# Patient Record
Sex: Female | Born: 1950
Health system: Southern US, Community
[De-identification: ages and names within clinical notes are randomized; demographics above are authoritative.]

## PROBLEM LIST (undated history)

## (undated) DIAGNOSIS — Z860100 Personal history of colon polyps, unspecified: Secondary | ICD-10-CM

## (undated) DIAGNOSIS — K7689 Other specified diseases of liver: Secondary | ICD-10-CM

## (undated) DIAGNOSIS — R079 Chest pain, unspecified: Secondary | ICD-10-CM

## (undated) DIAGNOSIS — D734 Cyst of spleen: Secondary | ICD-10-CM

## (undated) DIAGNOSIS — E042 Nontoxic multinodular goiter: Secondary | ICD-10-CM

## (undated) DIAGNOSIS — E0590 Thyrotoxicosis, unspecified without thyrotoxic crisis or storm: Secondary | ICD-10-CM

## (undated) DIAGNOSIS — R918 Other nonspecific abnormal finding of lung field: Secondary | ICD-10-CM

## (undated) DIAGNOSIS — Z801 Family history of malignant neoplasm of trachea, bronchus and lung: Secondary | ICD-10-CM

## (undated) DIAGNOSIS — I10 Essential (primary) hypertension: Secondary | ICD-10-CM

## (undated) DIAGNOSIS — G8929 Other chronic pain: Secondary | ICD-10-CM

## (undated) HISTORY — DX: Personal history of colon polyps, unspecified: Z86.0100

## (undated) HISTORY — DX: Family history of malignant neoplasm of trachea, bronchus and lung: Z80.1

## (undated) HISTORY — DX: Personal history of colonic polyps: Z86.010

## (undated) HISTORY — PX: TONSILLECTOMY: SUR1361

---

## 1998-04-14 ENCOUNTER — Emergency Department (HOSPITAL_COMMUNITY): Admission: EM | Admit: 1998-04-14 | Discharge: 1998-04-14 | Payer: Self-pay | Admitting: Emergency Medicine

## 1998-04-16 ENCOUNTER — Emergency Department (HOSPITAL_COMMUNITY): Admission: EM | Admit: 1998-04-16 | Discharge: 1998-04-16 | Payer: Self-pay | Admitting: Emergency Medicine

## 1998-09-15 ENCOUNTER — Encounter: Payer: Self-pay | Admitting: Emergency Medicine

## 1998-09-15 ENCOUNTER — Emergency Department (HOSPITAL_COMMUNITY): Admission: EM | Admit: 1998-09-15 | Discharge: 1998-09-15 | Payer: Self-pay | Admitting: Emergency Medicine

## 1999-08-20 ENCOUNTER — Ambulatory Visit (HOSPITAL_COMMUNITY): Admission: RE | Admit: 1999-08-20 | Discharge: 1999-08-20 | Payer: Self-pay | Admitting: Internal Medicine

## 1999-08-20 ENCOUNTER — Encounter: Payer: Self-pay | Admitting: Internal Medicine

## 2000-02-09 ENCOUNTER — Emergency Department (HOSPITAL_COMMUNITY): Admission: EM | Admit: 2000-02-09 | Discharge: 2000-02-09 | Payer: Self-pay | Admitting: Emergency Medicine

## 2000-02-09 ENCOUNTER — Encounter: Payer: Self-pay | Admitting: Emergency Medicine

## 2000-08-24 ENCOUNTER — Emergency Department (HOSPITAL_COMMUNITY): Admission: EM | Admit: 2000-08-24 | Discharge: 2000-08-25 | Payer: Self-pay | Admitting: *Deleted

## 2002-09-09 ENCOUNTER — Emergency Department (HOSPITAL_COMMUNITY): Admission: EM | Admit: 2002-09-09 | Discharge: 2002-09-10 | Payer: Self-pay | Admitting: Emergency Medicine

## 2002-09-10 ENCOUNTER — Encounter: Payer: Self-pay | Admitting: Emergency Medicine

## 2002-09-27 ENCOUNTER — Encounter: Payer: Self-pay | Admitting: Family Medicine

## 2002-09-27 ENCOUNTER — Encounter: Admission: RE | Admit: 2002-09-27 | Discharge: 2002-09-27 | Payer: Self-pay | Admitting: Family Medicine

## 2003-08-18 ENCOUNTER — Emergency Department (HOSPITAL_COMMUNITY): Admission: EM | Admit: 2003-08-18 | Discharge: 2003-08-19 | Payer: Self-pay | Admitting: Emergency Medicine

## 2006-03-23 ENCOUNTER — Emergency Department (HOSPITAL_COMMUNITY): Admission: EM | Admit: 2006-03-23 | Discharge: 2006-03-23 | Payer: Self-pay | Admitting: Emergency Medicine

## 2006-08-03 ENCOUNTER — Emergency Department (HOSPITAL_COMMUNITY): Admission: EM | Admit: 2006-08-03 | Discharge: 2006-08-03 | Payer: Self-pay | Admitting: Family Medicine

## 2008-09-28 ENCOUNTER — Observation Stay (HOSPITAL_COMMUNITY): Admission: EM | Admit: 2008-09-28 | Discharge: 2008-09-29 | Payer: Self-pay | Admitting: Emergency Medicine

## 2008-09-28 ENCOUNTER — Ambulatory Visit: Payer: Self-pay | Admitting: Cardiovascular Disease

## 2008-09-29 ENCOUNTER — Encounter (INDEPENDENT_AMBULATORY_CARE_PROVIDER_SITE_OTHER): Payer: Self-pay | Admitting: Internal Medicine

## 2009-08-28 ENCOUNTER — Ambulatory Visit (HOSPITAL_COMMUNITY): Admission: RE | Admit: 2009-08-28 | Discharge: 2009-08-28 | Payer: Self-pay | Admitting: Psychiatry

## 2010-02-08 ENCOUNTER — Emergency Department (HOSPITAL_COMMUNITY): Admission: EM | Admit: 2010-02-08 | Discharge: 2010-02-09 | Payer: Self-pay | Admitting: Emergency Medicine

## 2010-11-10 ENCOUNTER — Encounter: Payer: Self-pay | Admitting: Internal Medicine

## 2010-11-10 ENCOUNTER — Encounter: Payer: Self-pay | Admitting: Family Medicine

## 2011-01-07 LAB — URINALYSIS, ROUTINE W REFLEX MICROSCOPIC
Glucose, UA: NEGATIVE mg/dL
Hgb urine dipstick: NEGATIVE
Specific Gravity, Urine: 1.033 — ABNORMAL HIGH (ref 1.005–1.030)
pH: 5.5 (ref 5.0–8.0)

## 2011-01-07 LAB — POCT I-STAT, CHEM 8
BUN: 17 mg/dL (ref 6–23)
Chloride: 110 mEq/L (ref 96–112)
Creatinine, Ser: 0.6 mg/dL (ref 0.4–1.2)
Potassium: 3.7 mEq/L (ref 3.5–5.1)
Sodium: 143 mEq/L (ref 135–145)

## 2011-01-07 LAB — CBC
HCT: 43.3 % (ref 36.0–46.0)
Hemoglobin: 14.4 g/dL (ref 12.0–15.0)
MCHC: 33.2 g/dL (ref 30.0–36.0)
MCV: 104.4 fL — ABNORMAL HIGH (ref 78.0–100.0)
Platelets: 268 10*3/uL (ref 150–400)
RBC: 4.15 MIL/uL (ref 3.87–5.11)

## 2011-01-07 LAB — URINE MICROSCOPIC-ADD ON

## 2011-01-07 LAB — DIFFERENTIAL
Lymphocytes Relative: 8 % — ABNORMAL LOW (ref 12–46)
Lymphs Abs: 1.5 10*3/uL (ref 0.7–4.0)
Monocytes Absolute: 0.1 10*3/uL (ref 0.1–1.0)
Neutrophils Relative %: 91 % — ABNORMAL HIGH (ref 43–77)

## 2011-03-04 NOTE — Discharge Summary (Signed)
Darlene Griffith, Darlene Griffith               ACCOUNT NO.:  [id number]   MEDICAL RECORD NO.:  [id number]          PATIENT TYPE:  OBV   LOCATION:  4710                         FACILITY:  [facility]   PHYSICIAN:  Elliot Cousin, M.D.    DATE OF BIRTH:  Feb 02, 1951   DATE OF ADMISSION:  09/28/2008  DATE OF DISCHARGE:  09/29/2008                               DISCHARGE SUMMARY   DISCHARGE DIAGNOSES:  1. Noncardiac chest pain.  2. Increased stressors at work.  3. Newly diagnosed goiter (TSH slightly low, free T4 pending).  4. Tobacco abuse.  5. Macrocytosis.  6. Hypokalemia.   DISCHARGE MEDICATIONS:  1. Protonix 40 mg daily.  2. Xanax 0.25 mg 1-2 tablets every 8 hours as needed for increased      stress and anxiety.  3. Multivitamin once daily.  4. Nicotine patch, use as directed on the label.  5. Tylenol Extra Strength, take as directed and as needed.  6. Discontinue Goody's powder.   DISCHARGE DISPOSITION:  The patient is currently in improved and stable  condition.  She will be discharged today.  She has no primary care  physician, however, she was given the number for Dr. Della Goo  and advised to follow up with her in 1-2 weeks.   CONSULTATIONS:  None.   PROCEDURES PERFORMED:  1. CT scan of the neck with contrast on September 29, 2008.  The      results revealed a 42 x 42-mm goiter on the left that extends into      the superior mediastinum and deviates the trachea to the right.  No      mass lesion is present and there is no significant cervical      adenopathy.  2. A 2-D echocardiogram on September 29, 2008.  The results revealed an      ejection fraction estimated to be 60%.  No left ventricular      regional wall motion abnormalities.  Findings consistent with very      mild aortic valve stenosis.  Left atrium was mildly dilated.   HISTORY OF PRESENT ILLNESS:  The patient is a 60 year old woman with no  significant past medical history, who presented to the emergency  department  on September 28, 2008 with a chief complaint of central chest  pain.  When she was evaluated in the emergency department, she was  hemodynamically stable.  Her lab data were virtually unremarkable.  Her  EKG revealed normal sinus rhythm without any ST or T-wave abnormalities.  Her chest x-ray revealed no acute cardiopulmonary process, although it  did reveal tracheal deviation to the right.  The patient was therefore  admitted for further evaluation and management.   For additional details, please see the dictated history and physical.   HOSPITAL COURSE:  1. CHEST PAIN.  The patient was started on nitroglycerin paste in the      emergency department.  Subsequently, the nitroglycerin paste was      discontinued and she was therefore started on as-needed sublingual      nitroglycerin.  Morphine was added at 2-4 mg IV every  4 hours      p.r.n. chest pain.  A baby aspirin was started as well as      prophylactic Protonix and Lovenox.  For further evaluation, D-      dimer, cardiac enzymes as well as a 2-D echocardiogram were      ordered.  The D-dimer was within normal limits at 0.30.  Her      cardiac enzymes were completely within normal limits.  Her 2-D      echocardiogram revealed no left ventricular regional wall motion      abnormalities.  Her ejection fraction was estimated to be 60%.  Her      fasting lipid profile revealed a total cholesterol of 184,      triglycerides of 116, HDL cholesterol of 39, and LDL cholesterol of      122.  Her TSH was slightly low at 0.311.  Because of her history of      smoking, the patient was counseled on tobacco cessation.  She was      also advised to stop smoking by the dictating physician.  With      further questioning, the patient acknowledged increased stress and      anxiety at work.  She also acknowledged taking Goody's powders on a      regular basis for stress-related headaches.  Her chest pain did      resolve during the hospitalization.   It was felt to be noncardiac      in origin.  Her chest pain may have been the consequence of      increased anxiety and/or gastroesophageal reflux disease versus      esophagitis from NSAID use.  The patient was advised to discontinue      Goody's powders and to try as-needed Tylenol.  Upon discharge,      Protonix was prescribed for further treatment.  Also, the patient      was prescribed as-needed Xanax for short-term treatment of anxiety      and stress.  2. GOITER AND SLIGHTLY LOW TSH.  For further evaluation of the right      tracheal deviation on the chest x-ray, a CT scan of the neck with      contrast was ordered.  The results are above; in essence, the CT      revealed a left-sided goiter.  There was no evidence of a      suspicious mass or airway compromise.  Her TSH was slightly low at      0.311.  A free T4 has been added and the result is currently      pending.  The patient was informed that the dictating physician      will call her next week with the results.  The patient was also      advised to call the dictating physician if she does not hear from      her in 3 days.  The patient voiced understanding.  3. MACROCYTOSIS.  The patient's hemoglobin was 12.1.  However, her MCV      was elevated at 104.1.  The patient denied alcohol use.  Vitamin      B12 and folate levels were ordered and the results are currently      pending.  The patient was advised to take a multivitamin once      daily.  4. HYPOKALEMIA.  At the time of the initial hospital assessment, the  patient's serum potassium was 3.3.  She was given 1 dose of oral      potassium chloride and her serum potassium improved to 3.6.  Blood      magnesium was ordered and was well within normal limits at 2.2.      Elliot Cousin, M.D.  Electronically Signed     DF/MEDQ  D:  09/29/2008  T:  09/30/2008  Job:  [id number]   cc:   Della Goo, M.D.

## 2011-03-04 NOTE — H&P (Signed)
Darlene Griffith, Darlene Griffith               ACCOUNT NO.:  [id number]   MEDICAL RECORD NO.:  [id number]          PATIENT TYPE:  EMS   LOCATION:  MAJO                         FACILITY:  [facility]   PHYSICIAN:  Ladell Pier, M.D.   DATE OF BIRTH:  08-Apr-1951   DATE OF ADMISSION:  09/28/2008  DATE OF DISCHARGE:                              HISTORY & PHYSICAL   CHIEF COMPLAINT:  Chest pain.   HISTORY OF PRESENT ILLNESS:  The patient is a 60 year old African  American female without any significant past medical history.  The  patient stated that last night she started having chest pain on and off  all night.  She stated that it was in the center of the chest.  It felt  more grabbing, and sometimes it felt as if it was shooting to her chest.  She stated that it happened most of the night, so she was scared to go  to sleep, so she kind of tried to stay up and watch TV.  In the morning,  the pain was still there.  She did not go into work until 12:30, because  she felt like going into work with the stress would make it even worse.  When she got into work at 12:30, actually about 20 minutes after sitting  down, she felt a grabbing chest pain in the center of her chest.  She  ended up having to go to the nurse where they sent her to the emergency  room.  She stated that was the second chest pain.  She did get some  shortness of breath.  She felt as if she could not breath.  She states  that she has been under a lot of stress lately with her mortgage, her  job, her children moving out, and now she is alone for the first time.  She is not used to being alone.  She was complaining of some soreness on  the left side of her chest for which she did see a doctor in the past,  but she does not have a lump or anything there.  She has not been to the  doctor in a long time since.   PAST MEDICAL HISTORY:  None.   PAST SURGICAL HISTORY:  C-section x1.   FAMILY HISTORY:  Mother is 56 years old.  She has thyroid  problems.  She  has history of heart disease and history of blood clots.  Father has  history unknown.  She had a grandmother that had a heart attack at 71.   SOCIAL HISTORY:  She smokes about half pack per day.  No alcohol use.  She is single.  She has four children.  She works in [employer].   MEDICATIONS:  None.   ALLERGIES:  None.   REVIEW OF SYSTEMS:  Negative, otherwise, stated in HPI.   PHYSICAL EXAMINATION:  VITAL SIGNS:  Temperature 98, blood pressure  104/66, pulse 92, respirations 26, pulse oximetry 98% on room air.  GENERAL:  The patient is sitting on the stretcher.  Does not seem to be  in any acute distress.  HEENT:  Normocephalic, atraumatic.  Pupils equal, round and reactive to  light.  Throat without erythema.  CARDIOVASCULAR:  Regular rate and rhythm with a 1/6 systolic murmur.  LUNGS:  Clear bilaterally.  No wheezes, rales or rhonchi.  ABDOMEN:  Soft, nontender, nondistended.  Positive bowel sounds.  EXTREMITIES:  Without edema.   LABORATORY DATA:  PT 12.0, INR 0.9, PTT 27.  Sodium 142, potassium 3.3,  chloride 107,  BUN 8, creatinine 0.7, glucose 98, hemoglobin 13.3 and  hematocrit 39.  Chest x-ray showed no acute cardiopulmonary process.  There is a deviation of the trachea to the right.  This could reflect  displacement of the trachea by a mass, adenopathy or asymmetrical  thyroid goiter.  Recommend follow up with a CT.  EKG:  No acute ST  segment elevation or depression.   ASSESSMENT/PLAN:  1. Chest pain.  2. Hypokalemia.  3. Tobacco abuse.  4. Abnormal chest x-ray, question of neck mass versus thyroid.  5. Deviated in the trachea.   Would admit the patient to the hospital.  Rule out MI with serial  enzymes.  Check TSH, fasting lipid panel, 2D echo.  Replete potassium.  Follow up with neck CT.      Ladell Pier, M.D.  Electronically Signed     N[place]/MEDQ  D:  09/28/2008  T:  09/29/2008  Job:  [id number]

## 2011-07-25 LAB — DIFFERENTIAL
Basophils Absolute: 0.1 10*3/uL (ref 0.0–0.1)
Basophils Relative: 0 % (ref 0–1)
Eosinophils Absolute: 0.2 10*3/uL (ref 0.0–0.7)
Eosinophils Absolute: 0.2 10*3/uL (ref 0.0–0.7)
Lymphs Abs: 3.6 10*3/uL (ref 0.7–4.0)
Lymphs Abs: 4 10*3/uL (ref 0.7–4.0)
Monocytes Absolute: 0.5 10*3/uL (ref 0.1–1.0)
Monocytes Relative: 5 % (ref 3–12)
Monocytes Relative: 5 % (ref 3–12)
Neutro Abs: 5.2 10*3/uL (ref 1.7–7.7)
Neutro Abs: 5.2 10*3/uL (ref 1.7–7.7)

## 2011-07-25 LAB — CBC
HCT: 38.3 % (ref 36.0–46.0)
Hemoglobin: 12.1 g/dL (ref 12.0–15.0)
Hemoglobin: 12.7 g/dL (ref 12.0–15.0)
MCHC: 33.3 g/dL (ref 30.0–36.0)
MCV: 106.2 fL — ABNORMAL HIGH (ref 78.0–100.0)
Platelets: 279 10*3/uL (ref 150–400)
RDW: 12.5 % (ref 11.5–15.5)
RDW: 12.7 % (ref 11.5–15.5)
WBC: 10 10*3/uL (ref 4.0–10.5)

## 2011-07-25 LAB — COMPREHENSIVE METABOLIC PANEL
Alkaline Phosphatase: 87 U/L (ref 39–117)
BUN: 7 mg/dL (ref 6–23)
BUN: 8 mg/dL (ref 6–23)
CO2: 24 mEq/L (ref 19–32)
CO2: 26 mEq/L (ref 19–32)
Calcium: 8.7 mg/dL (ref 8.4–10.5)
Calcium: 8.8 mg/dL (ref 8.4–10.5)
Chloride: 112 mEq/L (ref 96–112)
Creatinine, Ser: 0.52 mg/dL (ref 0.4–1.2)
GFR calc Af Amer: >60 mL/min (ref >60)
Potassium: 3.6 mEq/L (ref 3.5–5.1)
Total Bilirubin: 0.6 mg/dL (ref 0.3–1.2)
Total Protein: 5.7 g/dL — ABNORMAL LOW (ref 6.0–8.3)

## 2011-07-25 LAB — LIPID PANEL
Cholesterol: 184 mg/dL (ref 0–200)
Cholesterol: 205 mg/dL — ABNORMAL HIGH (ref 0–200)
HDL: 39 mg/dL — ABNORMAL LOW (ref >39)
HDL: 44 mg/dL (ref >39)
LDL Cholesterol: 113 mg/dL — ABNORMAL HIGH (ref 0–99)
Total CHOL/HDL Ratio: 4.7 RATIO
Triglycerides: 116 mg/dL (ref <150)
Triglycerides: 242 mg/dL — ABNORMAL HIGH (ref <150)
VLDL: 48 mg/dL — ABNORMAL HIGH (ref 0–40)

## 2011-07-25 LAB — CARDIAC PANEL(CRET KIN+CKTOT+MB+TROPI)
CK, MB: 0.7 ng/mL (ref 0.3–4.0)
Relative Index: INVALID (ref 0.0–2.5)
Total CK: 55 U/L (ref 7–177)
Troponin I: <0.01 ng/mL (ref 0.00–0.06)

## 2011-07-25 LAB — POCT I-STAT, CHEM 8
BUN: 8 mg/dL (ref 6–23)
Calcium, Ion: 1.21 mmol/L (ref 1.12–1.32)
Creatinine, Ser: 0.7 mg/dL (ref 0.4–1.2)
Glucose, Bld: 98 mg/dL (ref 70–99)
HCT: 39 % (ref 36.0–46.0)
Hemoglobin: 13.3 g/dL (ref 12.0–15.0)
TCO2: 27 mmol/L (ref 0–100)

## 2011-07-25 LAB — D-DIMER, QUANTITATIVE: D-Dimer, Quant: 0.31 ug/mL-FEU (ref 0.00–0.48)

## 2011-07-25 LAB — APTT: aPTT: 27 seconds (ref 24–37)

## 2011-07-25 LAB — POCT CARDIAC MARKERS: CKMB, poc: <1 ng/mL — ABNORMAL LOW (ref 1.0–8.0)

## 2011-07-25 LAB — TSH: TSH: 0.266 u[IU]/mL — ABNORMAL LOW (ref 0.350–4.500)

## 2011-07-25 LAB — CK TOTAL AND CKMB (NOT AT ARMC): Relative Index: INVALID (ref 0.0–2.5)

## 2011-07-25 LAB — MAGNESIUM: Magnesium: 2.2 mg/dL (ref 1.5–2.5)

## 2015-02-16 ENCOUNTER — Emergency Department (HOSPITAL_COMMUNITY)
Admission: EM | Admit: 2015-02-16 | Discharge: 2015-02-16 | Disposition: A | Discharge status: Home / Self Care | Payer: Self-pay

## 2015-02-16 ENCOUNTER — Inpatient Hospital Stay (HOSPITAL_BASED_OUTPATIENT_CLINIC_OR_DEPARTMENT_OTHER)
Admission: EM | Admit: 2015-02-16 | Discharge: 2015-02-18 | DRG: 313 | Disposition: A | Discharge status: Home / Self Care | Payer: 59 | Attending: Cardiology | Admitting: Cardiology

## 2015-02-16 ENCOUNTER — Emergency Department (HOSPITAL_BASED_OUTPATIENT_CLINIC_OR_DEPARTMENT_OTHER): Payer: 59

## 2015-02-16 ENCOUNTER — Encounter (HOSPITAL_BASED_OUTPATIENT_CLINIC_OR_DEPARTMENT_OTHER): Payer: Self-pay

## 2015-02-16 DIAGNOSIS — R911 Solitary pulmonary nodule: Secondary | ICD-10-CM

## 2015-02-16 DIAGNOSIS — R9431 Abnormal electrocardiogram [ECG] [EKG]: Secondary | ICD-10-CM

## 2015-02-16 DIAGNOSIS — K7689 Other specified diseases of liver: Secondary | ICD-10-CM

## 2015-02-16 DIAGNOSIS — R079 Chest pain, unspecified: Secondary | ICD-10-CM

## 2015-02-16 DIAGNOSIS — R0789 Other chest pain: Principal | ICD-10-CM

## 2015-02-16 DIAGNOSIS — Z87891 Personal history of nicotine dependence: Secondary | ICD-10-CM

## 2015-02-16 DIAGNOSIS — E669 Obesity, unspecified: Secondary | ICD-10-CM | POA: Diagnosis present

## 2015-02-16 DIAGNOSIS — R05 Cough: Secondary | ICD-10-CM

## 2015-02-16 DIAGNOSIS — R059 Cough, unspecified: Secondary | ICD-10-CM

## 2015-02-16 DIAGNOSIS — D734 Cyst of spleen: Secondary | ICD-10-CM

## 2015-02-16 DIAGNOSIS — E785 Hyperlipidemia, unspecified: Secondary | ICD-10-CM

## 2015-02-16 DIAGNOSIS — I350 Nonrheumatic aortic (valve) stenosis: Secondary | ICD-10-CM | POA: Diagnosis present

## 2015-02-16 DIAGNOSIS — Z6829 Body mass index (BMI) 29.0-29.9, adult: Secondary | ICD-10-CM

## 2015-02-16 DIAGNOSIS — E042 Nontoxic multinodular goiter: Secondary | ICD-10-CM | POA: Diagnosis present

## 2015-02-16 DIAGNOSIS — R0602 Shortness of breath: Secondary | ICD-10-CM

## 2015-02-16 DIAGNOSIS — I10 Essential (primary) hypertension: Secondary | ICD-10-CM

## 2015-02-16 DIAGNOSIS — J40 Bronchitis, not specified as acute or chronic: Secondary | ICD-10-CM

## 2015-02-16 HISTORY — DX: Chest pain, unspecified: R07.9

## 2015-02-16 HISTORY — DX: Cyst of spleen: D73.4

## 2015-02-16 HISTORY — DX: Other nonspecific abnormal finding of lung field: R91.8

## 2015-02-16 HISTORY — DX: Nontoxic multinodular goiter: E04.2

## 2015-02-16 HISTORY — DX: Essential (primary) hypertension: I10

## 2015-02-16 HISTORY — DX: Other specified diseases of liver: K76.89

## 2015-02-16 LAB — URINE MICROSCOPIC-ADD ON

## 2015-02-16 LAB — CBC WITH DIFFERENTIAL/PLATELET
BASOS PCT: 0 % (ref 0–1)
Basophils Absolute: 0 10*3/uL (ref 0.0–0.1)
EOS ABS: 0.3 10*3/uL (ref 0.0–0.7)
EOS PCT: 3 % (ref 0–5)
HCT: 40.8 % (ref 36.0–46.0)
Hemoglobin: 13.6 g/dL (ref 12.0–15.0)
LYMPHS ABS: 2.9 10*3/uL (ref 0.7–4.0)
Lymphocytes Relative: 27 % (ref 12–46)
MCH: 35 pg — ABNORMAL HIGH (ref 26.0–34.0)
MCHC: 33.3 g/dL (ref 30.0–36.0)
MCV: 104.9 fL — AB (ref 78.0–100.0)
MONO ABS: 0.8 10*3/uL (ref 0.1–1.0)
MONOS PCT: 8 % (ref 3–12)
NEUTROS ABS: 6.8 10*3/uL (ref 1.7–7.7)
Neutrophils Relative %: 62 % (ref 43–77)
PLATELETS: 280 10*3/uL (ref 150–400)
RBC: 3.89 MIL/uL (ref 3.87–5.11)
RDW: 11.4 % — AB (ref 11.5–15.5)
WBC: 10.9 10*3/uL — AB (ref 4.0–10.5)

## 2015-02-16 LAB — URINALYSIS, ROUTINE W REFLEX MICROSCOPIC
Bilirubin Urine: NEGATIVE
Glucose, UA: NEGATIVE mg/dL
KETONES UR: NEGATIVE mg/dL
Leukocytes, UA: NEGATIVE
Nitrite: NEGATIVE
PH: 6 (ref 5.0–8.0)
PROTEIN: NEGATIVE mg/dL
Specific Gravity, Urine: 1.024 (ref 1.005–1.030)
UROBILINOGEN UA: 1 mg/dL (ref 0.0–1.0)

## 2015-02-16 LAB — COMPREHENSIVE METABOLIC PANEL
ALT: 13 U/L (ref 0–35)
ANION GAP: 8 (ref 5–15)
AST: 15 U/L (ref 0–37)
Albumin: 4 g/dL (ref 3.5–5.2)
Alkaline Phosphatase: 92 U/L (ref 39–117)
BILIRUBIN TOTAL: 0.5 mg/dL (ref 0.3–1.2)
BUN: 8 mg/dL (ref 6–23)
CALCIUM: 8.5 mg/dL (ref 8.4–10.5)
CHLORIDE: 110 mmol/L (ref 96–112)
CO2: 23 mmol/L (ref 19–32)
Creatinine, Ser: 0.47 mg/dL — ABNORMAL LOW (ref 0.50–1.10)
Glucose, Bld: 106 mg/dL — ABNORMAL HIGH (ref 70–99)
Potassium: 3.2 mmol/L — ABNORMAL LOW (ref 3.5–5.1)
SODIUM: 141 mmol/L (ref 135–145)
Total Protein: 7.2 g/dL (ref 6.0–8.3)

## 2015-02-16 LAB — TROPONIN I
Troponin I: <0.03 ng/mL (ref <0.031)
Troponin I: <0.03 ng/mL (ref <0.031)

## 2015-02-16 LAB — BRAIN NATRIURETIC PEPTIDE: B Natriuretic Peptide: 29.2 pg/mL (ref 0.0–100.0)

## 2015-02-16 MED ORDER — ATORVASTATIN CALCIUM 40 MG PO TABS
40.0000 mg | ORAL_TABLET | Freq: Every day | ORAL | Status: DC
  Administered 2015-02-17: 40 mg via ORAL
  Filled 2015-02-16 (×2): qty 1

## 2015-02-16 MED ORDER — IPRATROPIUM-ALBUTEROL 0.5-2.5 (3) MG/3ML IN SOLN
3.0000 mL | RESPIRATORY_TRACT | Status: DC
  Administered 2015-02-16 (×2): 3 mL via RESPIRATORY_TRACT
  Filled 2015-02-16 (×2): qty 3

## 2015-02-16 MED ORDER — NITROGLYCERIN 0.4 MG SL SUBL: 0.4000 mg | SUBLINGUAL_TABLET | SUBLINGUAL | Status: DC | PRN

## 2015-02-16 MED ORDER — METOPROLOL TARTRATE 25 MG PO TABS
25.0000 mg | ORAL_TABLET | Freq: Two times a day (BID) | ORAL | Status: DC
  Filled 2015-02-16: qty 1

## 2015-02-16 MED ORDER — NITROGLYCERIN 2 % TD OINT
1.0000 [in_us] | TOPICAL_OINTMENT | Freq: Once | TRANSDERMAL | Status: AC
  Administered 2015-02-16: 1 [in_us] via TOPICAL
  Filled 2015-02-16: qty 1

## 2015-02-16 MED ORDER — ASPIRIN 300 MG RE SUPP
300.0000 mg | RECTAL | Status: DC
  Filled 2015-02-16: qty 1

## 2015-02-16 MED ORDER — ONDANSETRON HCL 4 MG/2ML IJ SOLN: 4.0000 mg | Freq: Four times a day (QID) | INTRAMUSCULAR | Status: DC | PRN

## 2015-02-16 MED ORDER — ASPIRIN 81 MG PO CHEW
324.0000 mg | CHEWABLE_TABLET | Freq: Once | ORAL | Status: AC
  Administered 2015-02-16: 324 mg via ORAL
  Filled 2015-02-16: qty 4

## 2015-02-16 MED ORDER — ASPIRIN 81 MG PO CHEW: 324.0000 mg | CHEWABLE_TABLET | ORAL | Status: DC

## 2015-02-16 MED ORDER — ACETAMINOPHEN 325 MG PO TABS
650.0000 mg | ORAL_TABLET | Freq: Once | ORAL | Status: AC
  Administered 2015-02-16: 650 mg via ORAL
  Filled 2015-02-16: qty 2

## 2015-02-16 MED ORDER — ENOXAPARIN SODIUM 40 MG/0.4ML ~~LOC~~ SOLN
40.0000 mg | Freq: Every day | SUBCUTANEOUS | Status: DC
  Administered 2015-02-17 – 2015-02-18 (×2): 40 mg via SUBCUTANEOUS
  Filled 2015-02-16 (×2): qty 0.4

## 2015-02-16 MED ORDER — ASPIRIN EC 81 MG PO TBEC
81.0000 mg | DELAYED_RELEASE_TABLET | Freq: Every day | ORAL | Status: DC
  Administered 2015-02-17 – 2015-02-18 (×2): 81 mg via ORAL
  Filled 2015-02-16 (×2): qty 1

## 2015-02-16 MED ORDER — ACETAMINOPHEN 325 MG PO TABS
650.0000 mg | ORAL_TABLET | ORAL | Status: DC | PRN
  Administered 2015-02-17 – 2015-02-18 (×3): 650 mg via ORAL
  Filled 2015-02-16 (×3): qty 2

## 2015-02-16 NOTE — ED Provider Notes (Signed)
CSN: [id number]     Arrival date & time 02/16/15  7672 History  This chart was scribed for Charlesetta Shanks, MD by [name] Holstein, ED Scribe. This patient was seen in room MH02/MH02 and the patient's care was started at 7:42 PM.     Chief Complaint  Patient presents with  . Generalized Body Aches      The history is provided by the patient. No language interpreter was used.   HPI Comments: Darlene Griffith is a 64 y.o. female with PMHx of HTN who presents to the Emergency Department complaining of generalized body aches with onset 5 days ago. Pt states she was dx with the flu 6 weeks ago. She states she was not hospitalized at that time. She notes the associated cough remained, worsening 5 days ago. Pt reports associated intermittent chest pain with onset yesterday, congestion, intermittent subjective fever and chills for 3 days, diaphoresis, generalized weakness, recurrent headache and intermittent vomiting with onset yesterday. Pt has tried [place] powder, Robitussin, VapoRub, and Biofreeze cream for relief. Pt states she accidentally took 3 Tramadol 3 days ago before symptoms worsened and onset of vomiting. She states she was able to tolerate chicken noodle soup today. Pt is not a smoker. Pt denies fever currently, abdominal pain, diarrhea, sinus pain, and rash.  Past Medical History  Diagnosis Date  . Essential hypertension   . Pulmonary nodules     a. 01/2015 CT Chest: RLL ~ 61mm subpleural nodule - rec f/u in 6-12 mos.  . Multinodular goiter     a. 01/2015 CT chest: multinodular goidter w/ substernal extension of the left lobe of the thyroid assoc w/ rightward deviation of tracheal air column.  . Chest pain     a. 01/2015 Echo: Nl LV fxn, Gr 1 DD, triv AI, mild MR.  . Hepatic cyst     a. noted on CT 01/2015.  [name] Splenic cyst     a. noted on CT 01/2015.   Past Surgical History  Procedure Laterality Date  . Cesarean section     No family history on file. History  Substance Use Topics  .  Smoking status: Never Smoker   . Smokeless tobacco: Not on file  . Alcohol Use: No   OB History    No data available     Review of Systems 10 Systems reviewed and all are negative for acute change except as noted in the HPI.     Allergies  Review of patient's allergies indicates no known allergies.  Home Medications   Prior to Admission medications   Medication Sig Start Date End Date Taking? Authorizing Provider  aspirin EC 81 MG EC tablet Take 1 tablet (81 mg total) by mouth daily. 02/18/15   Rogelia Mire, NP  atorvastatin (LIPITOR) 40 MG tablet Take 1 tablet (40 mg total) by mouth daily at 6 PM. 02/18/15   Rogelia Mire, NP  azelastine (ASTELIN) 0.1 % nasal spray Place 2 sprays into both nostrils 2 (two) times daily. Use in each nostril as directed 02/18/15   Rogelia Mire, NP  lisinopril (PRINIVIL,ZESTRIL) 20 MG tablet Take 1 tablet (20 mg total) by mouth daily. 02/18/15   Rogelia Mire, NP  nitroGLYCERIN (NITROSTAT) 0.4 MG SL tablet Place 1 tablet (0.4 mg total) under the tongue every 5 (five) minutes x 3 doses as needed for chest pain. 02/18/15   Rogelia Mire, NP   BP 137/84 mmHg  Pulse 82  Temp(Src) 98.4 F (36.9 C) (  Oral)  Resp 18  Ht 5\' 1"  (1.549 m)  Wt 156 lb 15.5 oz (71.2 kg)  BMI 29.67 kg/m2  SpO2 100% Physical Exam  Constitutional: She is oriented to person, place, and time. She appears well-developed and well-nourished.  HENT:  Head: Normocephalic and atraumatic.  Eyes: EOM are normal. Pupils are equal, round, and reactive to light.  Neck: Neck supple.  Cardiovascular: Normal rate, regular rhythm, normal heart sounds and intact distal pulses.   Pulmonary/Chest: Effort normal and breath sounds normal.  Abdominal: Soft. Bowel sounds are normal. She exhibits no distension. There is no tenderness.  Musculoskeletal: Normal range of motion. She exhibits no edema.  Neurological: She is alert and oriented to person, place, and time. She has  normal strength. Coordination normal. GCS eye subscore is 4. GCS verbal subscore is 5. GCS motor subscore is 6.  Skin: Skin is warm, dry and intact.  Psychiatric: She has a normal mood and affect.    ED Course  Procedures (including critical care time) DIAGNOSTIC STUDIES: Oxygen Saturation is 99% on room air, normal by my interpretation.    COORDINATION OF CARE: 7:50 PM Discussed treatment plan with patient at beside, the patient agrees with the plan and has no further questions at this time.   Labs Review Labs Reviewed  CBC WITH DIFFERENTIAL/PLATELET - Abnormal; Notable for the following:    WBC 10.9 (*)    MCV 104.9 (*)    MCH 35.0 (*)    RDW 11.4 (*)    All other components within normal limits  COMPREHENSIVE METABOLIC PANEL - Abnormal; Notable for the following:    Potassium 3.2 (*)    Glucose, Bld 106 (*)    Creatinine, Ser 0.47 (*)    All other components within normal limits  URINALYSIS, ROUTINE W REFLEX MICROSCOPIC - Abnormal; Notable for the following:    Hgb urine dipstick MODERATE (*)    All other components within normal limits  CBC - Abnormal; Notable for the following:    RBC 3.67 (*)    MCV 103.8 (*)    MCH 34.1 (*)    All other components within normal limits  LIPID PANEL - Abnormal; Notable for the following:    Cholesterol 213 (*)    HDL 37 (*)    LDL Cholesterol 151 (*)    All other components within normal limits  BRAIN NATRIURETIC PEPTIDE  TROPONIN I  URINE MICROSCOPIC-ADD ON  TROPONIN I  CREATININE, SERUM  TROPONIN I  TROPONIN I  TROPONIN I  TSH  T3, FREE  T4, FREE  HEMOGLOBIN A1C  INFLUENZA PANEL BY PCR (TYPE A & B, H1N1)    Imaging Review No results found.   EKG Interpretation   Date/Time:  Friday February 16 2015 18:37:50 EDT Ventricular Rate:  93 PR Interval:  172 QRS Duration: 82 QT Interval:  346 QTC Calculation: 430 R Axis:   53 Text Interpretation:  Normal sinus rhythm Right atrial enlargement  Nonspecific ST and T wave  abnormality Abnormal ECG agree.  Confirmed by  Johnney Killian, [place], Jeannie Done [phone]) on 02/16/2015 9:17:45 PM      Results for orders placed or performed during the hospital encounter of 02/16/15  CBC with Differential  Result Value Ref Range   WBC 10.9 (H) 4.0 - 10.5 K/uL   RBC 3.89 3.87 - 5.11 MIL/uL   Hemoglobin 13.6 12.0 - 15.0 g/dL   HCT 40.8 36.0 - 46.0 %   MCV 104.9 (H) 78.0 - 100.0 fL  MCH 35.0 (H) 26.0 - 34.0 pg   MCHC 33.3 30.0 - 36.0 g/dL   RDW 11.4 (L) 11.5 - 15.5 %   Platelets 280 150 - 400 K/uL   Neutrophils Relative % 62 43 - 77 %   Neutro Abs 6.8 1.7 - 7.7 K/uL   Lymphocytes Relative 27 12 - 46 %   Lymphs Abs 2.9 0.7 - 4.0 K/uL   Monocytes Relative 8 3 - 12 %   Monocytes Absolute 0.8 0.1 - 1.0 K/uL   Eosinophils Relative 3 0 - 5 %   Eosinophils Absolute 0.3 0.0 - 0.7 K/uL   Basophils Relative 0 0 - 1 %   Basophils Absolute 0.0 0.0 - 0.1 K/uL  Comprehensive metabolic panel  Result Value Ref Range   Sodium 141 135 - 145 mmol/L   Potassium 3.2 (L) 3.5 - 5.1 mmol/L   Chloride 110 96 - 112 mmol/L   CO2 23 19 - 32 mmol/L   Glucose, Bld 106 (H) 70 - 99 mg/dL   BUN 8 6 - 23 mg/dL   Creatinine, Ser 0.47 (L) 0.50 - 1.10 mg/dL   Calcium 8.5 8.4 - 10.5 mg/dL   Total Protein 7.2 6.0 - 8.3 g/dL   Albumin 4.0 3.5 - 5.2 g/dL   AST 15 0 - 37 U/L   ALT 13 0 - 35 U/L   Alkaline Phosphatase 92 39 - 117 U/L   Total Bilirubin 0.5 0.3 - 1.2 mg/dL   GFR calc non Af Amer >90 >90 mL/min   GFR calc Af Amer >90 >90 mL/min   Anion gap 8 5 - 15  Urinalysis, Routine w reflex microscopic  Result Value Ref Range   Color, Urine YELLOW YELLOW   APPearance CLEAR CLEAR   Specific Gravity, Urine 1.024 1.005 - 1.030   pH 6.0 5.0 - 8.0   Glucose, UA NEGATIVE NEGATIVE mg/dL   Hgb urine dipstick MODERATE (A) NEGATIVE   Bilirubin Urine NEGATIVE NEGATIVE   Ketones, ur NEGATIVE NEGATIVE mg/dL   Protein, ur NEGATIVE NEGATIVE mg/dL   Urobilinogen, UA 1.0 0.0 - 1.0 mg/dL   Nitrite NEGATIVE NEGATIVE    Leukocytes, UA NEGATIVE NEGATIVE  Brain natriuretic peptide  Result Value Ref Range   B Natriuretic Peptide 29.2 0.0 - 100.0 pg/mL  Troponin I  Result Value Ref Range   Troponin I <0.03 <0.031 ng/mL  Urine microscopic-add on  Result Value Ref Range   Squamous Epithelial / LPF RARE RARE   RBC / HPF 7-10 <3 RBC/hpf   Bacteria, UA RARE RARE  Troponin I  Result Value Ref Range   Troponin I <0.03 <0.031 ng/mL  CBC  Result Value Ref Range   WBC 9.8 4.0 - 10.5 K/uL   RBC 3.67 (L) 3.87 - 5.11 MIL/uL   Hemoglobin 12.5 12.0 - 15.0 g/dL   HCT 38.1 36.0 - 46.0 %   MCV 103.8 (H) 78.0 - 100.0 fL   MCH 34.1 (H) 26.0 - 34.0 pg   MCHC 32.8 30.0 - 36.0 g/dL   RDW 12.1 11.5 - 15.5 %   Platelets 290 150 - 400 K/uL  Creatinine, serum  Result Value Ref Range   Creatinine, Ser 0.62 0.50 - 1.10 mg/dL   GFR calc non Af Amer >90 >90 mL/min   GFR calc Af Amer >90 >90 mL/min  Troponin I-(serum)  Result Value Ref Range   Troponin I <0.03 <0.031 ng/mL  Troponin I-(serum)  Result Value Ref Range   Troponin I <0.03 <0.031  ng/mL  Troponin I-(serum)  Result Value Ref Range   Troponin I 0.03 <0.031 ng/mL  TSH  Result Value Ref Range   TSH 1.074 0.350 - 4.500 uIU/mL  T3, free  Result Value Ref Range   T3, Free 2.5 2.0 - 4.4 pg/mL  T4, free  Result Value Ref Range   Free T4 1.01 0.80 - 1.80 ng/dL  Hemoglobin A1c  Result Value Ref Range   Hgb A1c MFr Bld 5.5 4.8 - 5.6 %   Mean Plasma Glucose 111 mg/dL  Lipid panel  Result Value Ref Range   Cholesterol 213 (H) 0 - 200 mg/dL   Triglycerides 124 <150 mg/dL   HDL 37 (L) >39 mg/dL   Total CHOL/HDL Ratio 5.8 RATIO   VLDL 25 0 - 40 mg/dL   LDL Cholesterol 151 (H) 0 - 99 mg/dL  Influenza panel by PCR (type A & B, H1N1)  Result Value Ref Range   Influenza A By PCR NEGATIVE NEGATIVE   Influenza B By PCR NEGATIVE NEGATIVE   H1N1 flu by pcr NOT DETECTED NOT DETECTED   Dg Chest 2 View  02/16/2015   CLINICAL DATA:  Cough and fever.  One day history  of chest pain  EXAM: CHEST  2 VIEW  COMPARISON:  July 29, 2008  FINDINGS: There is no edema or consolidation. Heart size and pulmonary vascularity are normal. No adenopathy. There is rightward deviation of the thoracic trachea. No bone lesions.  IMPRESSION: Rightward deviation of the thoracic trachea, finding that was also present on prior study. The most likely etiology for this finding is thyroid enlargement. No edema or consolidation.   Electronically Signed   By: Lowella Grip III M.D.   On: 02/16/2015 20:14   Ct Chest Wo Contrast  02/17/2015   CLINICAL DATA:  Body aches, chills and cough.  EXAM: CT CHEST WITHOUT CONTRAST  TECHNIQUE: Multidetector CT imaging of the chest was performed following the standard protocol without IV contrast.  COMPARISON:  Chest radiograph- 02/16/2015; 09/28/2008  FINDINGS: There is minimal subsegmental atelectasis within the right costophrenic angle (image 38, series 3) as well as is the lingula (image 29, series 3). No discrete focal airspace opacities. No pleural effusion or pneumothorax.  There is rightward deviation of the trachea secondary to substernal extension of a heterogeneous and enlarged left lobe of the thyroid which contains innumerable ill-defined hypo attenuating nodules with dominant nodule within the caudal aspect of the left lobe of the thyroid measuring approximate 1.5 x 2.2 cm (image 15, series 2) and dominant ill-defined nodule/mass within the right lobe of the thyroid measuring approximately 2.4 x 2.7 cm (image 1, series 2). The left lobe of the thyroid extends to near the level of the carina. The central pulmonary airways remain patent.  Punctate (approximately 5 mm) subpleural nodule within the right lower lobe (image 30, series 3). Scattered shotty mediastinal lymph nodes individually not enlarged by size criteria. No mediastinal, hilar axillary lymphadenopathy on this noncontrast examination.  Normal heart size. No pericardial effusion. Normal  caliber of the thoracic aorta. No intramural hematoma.  Limited noncontrast evaluation of the upper abdomen demonstrates multiple ill-defined hypo attenuating hepatic lesions with dominant lesion within the posterior aspect of the lateral segment of the left lobe of the liver measuring approximately 1.6 cm in diameter (image 47, series 2) and while incompletely characterized on the present examination, in the absence of a known primary malignancy is favored are favored to represent hepatic cysts. Ill-defined punctate (approximately 6  mm) hypoattenuating splenic lesion, likely too small to adequately characterize of favored to represent a splenic cyst. No perisplenic stranding.  No acute or aggressive osseous abnormalities. Mild DDD within the mid aspect of the thoracic spine with mildly accentuated thoracic kyphosis.  Regional soft tissues appear normal.  IMPRESSION: 1. No acute cardiopulmonary disease. Specifically, no focal airspace opacities to suggest pneumonia. 2. Indeterminate punctate (approximately 5 mm) subpleural nodule within the right lower lobe. Comparison with prior outside examinations is recommended. If the patient is at high risk for bronchogenic carcinoma, follow-up chest CT at 6-12 months is recommended. If the patient is at low risk for bronchogenic carcinoma, follow-up chest CT at 12 months is recommended. This recommendation follows the consensus statement: Guidelines for Management of Small Pulmonary Nodules Detected on CT Scans: A Statement from the [place] as published in Radiology 2005;237:395-400. 3. Ill-defined hypoattenuating hepatic and splenic lesions, incompletely characterized on the present examination though favored to represent cysts. 4. Findings compatible with multi nodular goiter with substernal extension of the left lobe of the thyroid to near the level of the carina with associated rightward deviation of the tracheal air column as demonstrated on prior chest  radiographs.   Electronically Signed   By: Sandi Mariscal M.D.   On: 02/17/2015 07:10     Meds ordered this encounter  Medications  . DISCONTD: ipratropium-albuterol (DUONEB) 0.5-2.5 (3) MG/3ML nebulizer solution 3 mL    Sig:   . aspirin chewable tablet 324 mg    Sig:   . nitroGLYCERIN (NITROGLYN) 2 % ointment 1 inch    Sig:   . acetaminophen (TYLENOL) tablet 650 mg    Sig:   . DISCONTD: aspirin chewable tablet 324 mg    Sig:   . DISCONTD: aspirin suppository 300 mg    Sig:   . DISCONTD: aspirin EC tablet 81 mg    Sig:   . DISCONTD: nitroGLYCERIN (NITROSTAT) SL tablet 0.4 mg    Sig:   . DISCONTD: acetaminophen (TYLENOL) tablet 650 mg    Sig:   . DISCONTD: ondansetron (ZOFRAN) injection 4 mg    Sig:   . DISCONTD: enoxaparin (LOVENOX) injection 40 mg    Sig:   . DISCONTD: metoprolol tartrate (LOPRESSOR) tablet 25 mg    Sig:   . DISCONTD: atorvastatin (LIPITOR) tablet 40 mg    Sig:   . DISCONTD: ipratropium-albuterol (DUONEB) 0.5-2.5 (3) MG/3ML nebulizer solution 3 mL    Sig:   . DISCONTD: potassium chloride SA (K-DUR,KLOR-CON) CR tablet 40 mEq    Sig:   . DISCONTD: lisinopril (PRINIVIL,ZESTRIL) tablet 20 mg    Sig:   . DISCONTD: azelastine (ASTELIN) 0.1 % nasal spray 2 spray    Sig:   . DISCONTD: HYDROcodone-homatropine (HYCODAN) 5-1.5 MG/5ML syrup 5 mL    Sig:   . aspirin EC 81 MG EC tablet    Sig: Take 1 tablet (81 mg total) by mouth daily.    Order Specific Question:  Supervising Provider    Answer:  COOPER, MICHAEL [[id number]]  . atorvastatin (LIPITOR) 40 MG tablet    Sig: Take 1 tablet (40 mg total) by mouth daily at 6 PM.    Dispense:  30 tablet    Refill:  6    Order Specific Question:  Supervising Provider    Answer:  COOPER, MICHAEL [[id number]]  . azelastine (ASTELIN) 0.1 % nasal spray    Sig: Place 2 sprays into both nostrils 2 (two) times daily. Use in each nostril as directed  Dispense:  30 mL    Refill:  6    Order Specific Question:  Supervising Provider     Answer:  COOPER, MICHAEL [[id number]]  . lisinopril (PRINIVIL,ZESTRIL) 20 MG tablet    Sig: Take 1 tablet (20 mg total) by mouth daily.    Dispense:  30 tablet    Refill:  6    Order Specific Question:  Supervising Provider    Answer:  COOPER, MICHAEL [[id number]]  . nitroGLYCERIN (NITROSTAT) 0.4 MG SL tablet    Sig: Place 1 tablet (0.4 mg total) under the tongue every 5 (five) minutes x 3 doses as needed for chest pain.    Dispense:  25 tablet    Refill:  3    Order Specific Question:  Supervising Provider    Answer:  Burt Knack, MICHAEL [[id number]]    MDM   Final diagnoses:  Chest pain, unspecified chest pain type  EKG abnormality  Bronchitis  Essential hypertension   The patient presents with multiple symptoms. She does however have chest pain with abnormal EKG. She has risk factors with hypertension. The patient will be admitted for further cardiac evaluation and observation.    Charlesetta Shanks, MD 02/20/15 [phone]

## 2015-02-16 NOTE — ED Notes (Signed)
Pt complains of bodyaches, chills, fever and cough.  Pt reports chest pain started yesterday.  Pt reports coughing up small amount of phlegm.

## 2015-02-17 ENCOUNTER — Encounter (HOSPITAL_COMMUNITY): Payer: Self-pay | Admitting: Radiology

## 2015-02-17 ENCOUNTER — Inpatient Hospital Stay (HOSPITAL_COMMUNITY): Payer: 59

## 2015-02-17 DIAGNOSIS — J40 Bronchitis, not specified as acute or chronic: Secondary | ICD-10-CM

## 2015-02-17 DIAGNOSIS — R079 Chest pain, unspecified: Secondary | ICD-10-CM

## 2015-02-17 DIAGNOSIS — R05 Cough: Secondary | ICD-10-CM

## 2015-02-17 DIAGNOSIS — D734 Cyst of spleen: Secondary | ICD-10-CM

## 2015-02-17 DIAGNOSIS — E785 Hyperlipidemia, unspecified: Secondary | ICD-10-CM

## 2015-02-17 DIAGNOSIS — R059 Cough, unspecified: Secondary | ICD-10-CM | POA: Insufficient documentation

## 2015-02-17 DIAGNOSIS — R9431 Abnormal electrocardiogram [ECG] [EKG]: Secondary | ICD-10-CM | POA: Insufficient documentation

## 2015-02-17 DIAGNOSIS — K7689 Other specified diseases of liver: Secondary | ICD-10-CM

## 2015-02-17 DIAGNOSIS — R5383 Other fatigue: Secondary | ICD-10-CM

## 2015-02-17 DIAGNOSIS — R911 Solitary pulmonary nodule: Secondary | ICD-10-CM

## 2015-02-17 DIAGNOSIS — I10 Essential (primary) hypertension: Secondary | ICD-10-CM

## 2015-02-17 DIAGNOSIS — R5381 Other malaise: Secondary | ICD-10-CM | POA: Insufficient documentation

## 2015-02-17 DIAGNOSIS — I350 Nonrheumatic aortic (valve) stenosis: Secondary | ICD-10-CM | POA: Insufficient documentation

## 2015-02-17 DIAGNOSIS — I351 Nonrheumatic aortic (valve) insufficiency: Secondary | ICD-10-CM | POA: Diagnosis not present

## 2015-02-17 DIAGNOSIS — R0602 Shortness of breath: Secondary | ICD-10-CM | POA: Insufficient documentation

## 2015-02-17 LAB — CREATININE, SERUM: CREATININE: 0.62 mg/dL (ref 0.50–1.10)

## 2015-02-17 LAB — INFLUENZA PANEL BY PCR (TYPE A & B)
H1N1 flu by pcr: NOT DETECTED
Influenza A By PCR: NEGATIVE
Influenza B By PCR: NEGATIVE

## 2015-02-17 LAB — CBC
HCT: 38.1 % (ref 36.0–46.0)
HEMOGLOBIN: 12.5 g/dL (ref 12.0–15.0)
MCH: 34.1 pg — AB (ref 26.0–34.0)
MCHC: 32.8 g/dL (ref 30.0–36.0)
MCV: 103.8 fL — AB (ref 78.0–100.0)
PLATELETS: 290 10*3/uL (ref 150–400)
RBC: 3.67 MIL/uL — ABNORMAL LOW (ref 3.87–5.11)
RDW: 12.1 % (ref 11.5–15.5)
WBC: 9.8 10*3/uL (ref 4.0–10.5)

## 2015-02-17 LAB — TROPONIN I
TROPONIN I: 0.03 ng/mL (ref <0.031)
Troponin I: <0.03 ng/mL (ref <0.031)

## 2015-02-17 LAB — LIPID PANEL
Cholesterol: 213 mg/dL — ABNORMAL HIGH (ref 0–200)
HDL: 37 mg/dL — ABNORMAL LOW (ref >39)
LDL Cholesterol: 151 mg/dL — ABNORMAL HIGH (ref 0–99)
Total CHOL/HDL Ratio: 5.8 RATIO
Triglycerides: 124 mg/dL (ref <150)
VLDL: 25 mg/dL (ref 0–40)

## 2015-02-17 LAB — T4, FREE: FREE T4: 1.01 ng/dL (ref 0.80–1.80)

## 2015-02-17 LAB — TSH: TSH: 1.074 u[IU]/mL (ref 0.350–4.500)

## 2015-02-17 MED ORDER — LISINOPRIL 20 MG PO TABS
20.0000 mg | ORAL_TABLET | Freq: Every day | ORAL | Status: DC
  Administered 2015-02-17 – 2015-02-18 (×2): 20 mg via ORAL
  Filled 2015-02-17 (×2): qty 1

## 2015-02-17 MED ORDER — AZELASTINE HCL 0.1 % NA SOLN
2.0000 | Freq: Two times a day (BID) | NASAL | Status: DC
  Administered 2015-02-17 – 2015-02-18 (×4): 2 via NASAL
  Filled 2015-02-17: qty 30

## 2015-02-17 MED ORDER — HYDROCODONE-HOMATROPINE 5-1.5 MG/5ML PO SYRP
5.0000 mL | ORAL_SOLUTION | ORAL | Status: DC | PRN
  Administered 2015-02-17 (×4): 5 mL via ORAL
  Filled 2015-02-17 (×4): qty 5

## 2015-02-17 MED ORDER — POTASSIUM CHLORIDE CRYS ER 20 MEQ PO TBCR
40.0000 meq | EXTENDED_RELEASE_TABLET | Freq: Two times a day (BID) | ORAL | Status: DC
  Administered 2015-02-17 – 2015-02-18 (×4): 40 meq via ORAL
  Filled 2015-02-17 (×5): qty 2

## 2015-02-17 MED ORDER — IPRATROPIUM-ALBUTEROL 0.5-2.5 (3) MG/3ML IN SOLN: 3.0000 mL | Freq: Four times a day (QID) | RESPIRATORY_TRACT | Status: DC | PRN

## 2015-02-17 NOTE — H&P (Signed)
Darlene Griffith is an 64 y.o. female.   Chief Complaint: Chest pain  HPI:  Darlene Griffith is a pleasant 64 year old female with history of goiter and hypertension. She is currently not being treated for hypertension. She generally is avoidant of physicians. She has been having cough and fatigue for several weeks. She started having sharp pain in the left breast 2 days ago. The pain lasts for maybe 1-2 minutes and generally is occuring at rest. It is not itself associated with shortness of breath, diaphoresis or syncope. She has been short of breath with cough for several weeks. This has been associated with fever and productive cough. She did have some brief improvement in the symptoms several weeks ago but then the symptoms worsened. She does have a history of goiter which she was lost to follow up for. She also has a history of hypertension and was prescribed lisinopril but she completed all the pills she had on hand and has not followed up for more refills. She has not noted any exertional chest pain and is currently chest pain free. She does have a history of smoking but has quit. She also has a burning pain in the left rib area that has been present for years.   Past Medical History  Diagnosis Date  . Hypertension     Past Surgical History  Procedure Laterality Date  . Cesarean section      No family history on file. Social History:  reports that she has never smoked. She does not have any smokeless tobacco history on file. She reports that she does not drink alcohol or use illicit drugs.  Allergies: No Known Allergies  No prescriptions prior to admission    Results for orders placed or performed during the hospital encounter of 02/16/15 (from the past 48 hour(s))  CBC with Differential     Status: Abnormal   Collection Time: 02/16/15  7:22 PM  Result Value Ref Range   WBC 10.9 (H) 4.0 - 10.5 K/uL   RBC 3.89 3.87 - 5.11 MIL/uL   Hemoglobin 13.6 12.0 - 15.0 g/dL   HCT 40.8 36.0 - 46.0 %    MCV 104.9 (H) 78.0 - 100.0 fL   MCH 35.0 (H) 26.0 - 34.0 pg   MCHC 33.3 30.0 - 36.0 g/dL   RDW 11.4 (L) 11.5 - 15.5 %   Platelets 280 150 - 400 K/uL   Neutrophils Relative % 62 43 - 77 %   Neutro Abs 6.8 1.7 - 7.7 K/uL   Lymphocytes Relative 27 12 - 46 %   Lymphs Abs 2.9 0.7 - 4.0 K/uL   Monocytes Relative 8 3 - 12 %   Monocytes Absolute 0.8 0.1 - 1.0 K/uL   Eosinophils Relative 3 0 - 5 %   Eosinophils Absolute 0.3 0.0 - 0.7 K/uL   Basophils Relative 0 0 - 1 %   Basophils Absolute 0.0 0.0 - 0.1 K/uL  Comprehensive metabolic panel     Status: Abnormal   Collection Time: 02/16/15  7:22 PM  Result Value Ref Range   Sodium 141 135 - 145 mmol/L   Potassium 3.2 (L) 3.5 - 5.1 mmol/L   Chloride 110 96 - 112 mmol/L   CO2 23 19 - 32 mmol/L   Glucose, Bld 106 (H) 70 - 99 mg/dL   BUN 8 6 - 23 mg/dL   Creatinine, Ser 0.47 (L) 0.50 - 1.10 mg/dL   Calcium 8.5 8.4 - 10.5 mg/dL   Total Protein  7.2 6.0 - 8.3 g/dL   Albumin 4.0 3.5 - 5.2 g/dL   AST 15 0 - 37 U/L   ALT 13 0 - 35 U/L   Alkaline Phosphatase 92 39 - 117 U/L   Total Bilirubin 0.5 0.3 - 1.2 mg/dL   GFR calc non Af Amer >90 >90 mL/min   GFR calc Af Amer >90 >90 mL/min    Comment: (NOTE) The eGFR has been calculated using the CKD EPI equation. This calculation has not been validated in all clinical situations. eGFR's persistently <90 mL/min signify possible Chronic Kidney Disease.    Anion gap 8 5 - 15  Brain natriuretic peptide     Status: None   Collection Time: 02/16/15  7:22 PM  Result Value Ref Range   B Natriuretic Peptide 29.2 0.0 - 100.0 pg/mL  Troponin I     Status: None   Collection Time: 02/16/15  7:22 PM  Result Value Ref Range   Troponin I <0.03 <0.031 ng/mL    Comment:        NO INDICATION OF MYOCARDIAL INJURY.   Urinalysis, Routine w reflex microscopic     Status: Abnormal   Collection Time: 02/16/15  7:35 PM  Result Value Ref Range   Color, Urine YELLOW YELLOW   APPearance CLEAR CLEAR   Specific  Gravity, Urine 1.024 1.005 - 1.030   pH 6.0 5.0 - 8.0   Glucose, UA NEGATIVE NEGATIVE mg/dL   Hgb urine dipstick MODERATE (A) NEGATIVE   Bilirubin Urine NEGATIVE NEGATIVE   Ketones, ur NEGATIVE NEGATIVE mg/dL   Protein, ur NEGATIVE NEGATIVE mg/dL   Urobilinogen, UA 1.0 0.0 - 1.0 mg/dL   Nitrite NEGATIVE NEGATIVE   Leukocytes, UA NEGATIVE NEGATIVE  Urine microscopic-add on     Status: None   Collection Time: 02/16/15  7:35 PM  Result Value Ref Range   Squamous Epithelial / LPF RARE RARE   RBC / HPF 7-10 <3 RBC/hpf   Bacteria, UA RARE RARE  Troponin I     Status: None   Collection Time: 02/16/15 10:00 PM  Result Value Ref Range   Troponin I <0.03 <0.031 ng/mL    Comment:        NO INDICATION OF MYOCARDIAL INJURY.    Dg Chest 2 View  02/16/2015   CLINICAL DATA:  Cough and fever.  One day history of chest pain  EXAM: CHEST  2 VIEW  COMPARISON:  July 29, 2008  FINDINGS: There is no edema or consolidation. Heart size and pulmonary vascularity are normal. No adenopathy. There is rightward deviation of the thoracic trachea. No bone lesions.  IMPRESSION: Rightward deviation of the thoracic trachea, finding that was also present on prior study. The most likely etiology for this finding is thyroid enlargement. No edema or consolidation.   Electronically Signed   By: Lowella Grip III M.D.   On: 02/16/2015 20:14    Review of Systems  Respiratory: Positive for cough.   Cardiovascular: Positive for chest pain.  All other systems reviewed and are negative.   Blood pressure 165/95, pulse 85, temperature 98.2 F (36.8 C), temperature source Oral, resp. rate 18, height 5' 1" (1.549 m), weight 156 lb 15.5 oz (71.2 kg), SpO2 100 %. Physical Exam  Nursing note and vitals reviewed. Constitutional: She is oriented to person, place, and time. No distress.  HENT:  Head: Normocephalic.  Eyes: Pupils are equal, round, and reactive to light.  Neck: No JVD present. Thyromegaly present.   Cardiovascular: Normal  rate, regular rhythm and normal pulses.   Murmur heard.  Systolic murmur is present with a grade of 3/6  Respiratory: She has wheezes.  GI: Soft. Bowel sounds are normal.  Musculoskeletal: She exhibits no edema.  Neurological: She is alert and oriented to person, place, and time.  Skin: Skin is warm and dry. She is not diaphoretic.     Assessment/Plan Darlene Betsch is a pleasant 64 year old female with hypertension, obesity and chest pain. Her chest pain seems non-cardiac by exam. She does have several other medical issues that warrant further examination. She has a murmur on exam consistent with aortic stenosis. She also has a goiter on physical exam and her blood pressure is elevated.   Chest pain  -will follow troponin overnight -continue aspirin -PRN nitroglycerin -PRN ECG -Likely stress test needed -check lipid panel   Aortic stenosis -needs echo  Cough/fever -possible flu vs pneumonia -send influenza screen -droplet precautions  Goiter -check TSH, FT3, FT4  Smoking history -CT screen for lung CA  Hypertension -start lisinopril 58m  Full Code  KOHAN, LUKE C 02/17/2015, 12:45 AM

## 2015-02-17 NOTE — Progress Notes (Signed)
SUBJECTIVE: Diffuse somatic complaints (shortness of breath, diffuse body aches, fleeting chest pains, left infraaxillary burning rib pain, feet pain).    No intake or output data in the 24 hours ending 02/17/15 1242  Current Facility-Administered Medications  Medication Dose Route Frequency Provider Last Rate Last Dose  . acetaminophen (TYLENOL) tablet 650 mg  650 mg Oral Q4H PRN Javier Docker, MD      . aspirin EC tablet 81 mg  81 mg Oral Daily Javier Docker, MD   81 mg at 02/17/15 1014  . atorvastatin (LIPITOR) tablet 40 mg  40 mg Oral q1800 Javier Docker, MD      . azelastine (ASTELIN) 0.1 % nasal spray 2 spray  2 spray Each Nare BID Javier Docker, MD   2 spray at 02/17/15 1014  . enoxaparin (LOVENOX) injection 40 mg  40 mg Subcutaneous Daily Javier Docker, MD   40 mg at 02/17/15 1014  . HYDROcodone-homatropine (HYCODAN) 5-1.5 MG/5ML syrup 5 mL  5 mL Oral Q4H PRN Javier Docker, MD   5 mL at 02/17/15 1117  . ipratropium-albuterol (DUONEB) 0.5-2.5 (3) MG/3ML nebulizer solution 3 mL  3 mL Nebulization Q6H PRN Jerline Pain, MD      . lisinopril (PRINIVIL,ZESTRIL) tablet 20 mg  20 mg Oral Daily Javier Docker, MD   20 mg at 02/17/15 1014  . nitroGLYCERIN (NITROSTAT) SL tablet 0.4 mg  0.4 mg Sublingual Q5 Min x 3 PRN Javier Docker, MD      . ondansetron [facility]) injection 4 mg  4 mg Intravenous Q6H PRN Javier Docker, MD      . potassium chloride SA (K-DUR,KLOR-CON) CR tablet 40 mEq  40 mEq Oral BID Javier Docker, MD   40 mEq at 02/17/15 1014    Filed Vitals:   02/16/15 2230 02/16/15 2348 02/17/15 0532 02/17/15 1015  BP: 146/83 165/95 139/90 152/100  Pulse: 79 85 77 92  Temp:  98.2 F (36.8 C) 98.1 F (36.7 C)   TempSrc:  Oral Oral   Resp: 17 18 18    Height:  5\' 1"  (1.549 m)    Weight:  156 lb 15.5 oz (71.2 kg)    SpO2: 96% 100% 94%     PHYSICAL EXAM General: NAD HEENT: Normal. Neck: No JVD, no thyromegaly.  Lungs: Clear to auscultation bilaterally with normal respiratory effort. CV: Nondisplaced  PMI.  Regular rate and rhythm, normal S1/S2, no Q7/Y1, soft 1/6 systolic murmur over RUSB.  No pretibial edema.    Abdomen: Soft, nontender, no distention.  Neurologic: Alert and oriented x 3.  Psych: Normal affect. Musculoskeletal: Normal range of motion. No gross deformities. Extremities: No clubbing or cyanosis.   TELEMETRY: Reviewed telemetry pt in sinus rhythm.  LABS: Basic Metabolic Panel:  Recent Labs  02/16/15 1922 02/17/15 0017  NA 141  --   K 3.2*  --   CL 110  --   CO2 23  --   GLUCOSE 106*  --   BUN 8  --   CREATININE 0.47* 0.62  CALCIUM 8.5  --    Liver Function Tests:  Recent Labs  02/16/15 1922  AST 15  ALT 13  ALKPHOS 92  BILITOT 0.5  PROT 7.2  ALBUMIN 4.0   No results for input(s): LIPASE, AMYLASE in the last 72 hours. CBC:  Recent Labs  02/16/15 1922 02/17/15 0017  WBC 10.9* 9.8  NEUTROABS 6.8  --   HGB 13.6 12.5  HCT 40.8 38.1  MCV 104.9*  103.8*  PLT 280 290   Cardiac Enzymes:  Recent Labs  02/16/15 2200 02/17/15 0017 02/17/15 0501  TROPONINI <0.03 <0.03 <0.03   BNP: Invalid input(s): POCBNP D-Dimer: No results for input(s): DDIMER in the last 72 hours. Hemoglobin A1C: No results for input(s): HGBA1C in the last 72 hours. Fasting Lipid Panel:  Recent Labs  02/17/15 0501  CHOL 213*  HDL 37*  LDLCALC 151*  TRIG 124  CHOLHDL 5.8   Thyroid Function Tests:  Recent Labs  02/17/15 0501  TSH 1.074   Anemia Panel: No results for input(s): VITAMINB12, FOLATE, FERRITIN, TIBC, IRON, RETICCTPCT in the last 72 hours.  RADIOLOGY: Dg Chest 2 View  02/16/2015   CLINICAL DATA:  Cough and fever.  One day history of chest pain  EXAM: CHEST  2 VIEW  COMPARISON:  July 29, 2008  FINDINGS: There is no edema or consolidation. Heart size and pulmonary vascularity are normal. No adenopathy. There is rightward deviation of the thoracic trachea. No bone lesions.  IMPRESSION: Rightward deviation of the thoracic trachea, finding that was  also present on prior study. The most likely etiology for this finding is thyroid enlargement. No edema or consolidation.   Electronically Signed   By: Lowella Grip III M.D.   On: 02/16/2015 20:14   Ct Chest Wo Contrast  02/17/2015   CLINICAL DATA:  Body aches, chills and cough.  EXAM: CT CHEST WITHOUT CONTRAST  TECHNIQUE: Multidetector CT imaging of the chest was performed following the standard protocol without IV contrast.  COMPARISON:  Chest radiograph- 02/16/2015; 09/28/2008  FINDINGS: There is minimal subsegmental atelectasis within the right costophrenic angle (image 38, series 3) as well as is the lingula (image 29, series 3). No discrete focal airspace opacities. No pleural effusion or pneumothorax.  There is rightward deviation of the trachea secondary to substernal extension of a heterogeneous and enlarged left lobe of the thyroid which contains innumerable ill-defined hypo attenuating nodules with dominant nodule within the caudal aspect of the left lobe of the thyroid measuring approximate 1.5 x 2.2 cm (image 15, series 2) and dominant ill-defined nodule/mass within the right lobe of the thyroid measuring approximately 2.4 x 2.7 cm (image 1, series 2). The left lobe of the thyroid extends to near the level of the carina. The central pulmonary airways remain patent.  Punctate (approximately 5 mm) subpleural nodule within the right lower lobe (image 30, series 3). Scattered shotty mediastinal lymph nodes individually not enlarged by size criteria. No mediastinal, hilar axillary lymphadenopathy on this noncontrast examination.  Normal heart size. No pericardial effusion. Normal caliber of the thoracic aorta. No intramural hematoma.  Limited noncontrast evaluation of the upper abdomen demonstrates multiple ill-defined hypo attenuating hepatic lesions with dominant lesion within the posterior aspect of the lateral segment of the left lobe of the liver measuring approximately 1.6 cm in diameter (image  47, series 2) and while incompletely characterized on the present examination, in the absence of a known primary malignancy is favored are favored to represent hepatic cysts. Ill-defined punctate (approximately 6 mm) hypoattenuating splenic lesion, likely too small to adequately characterize of favored to represent a splenic cyst. No perisplenic stranding.  No acute or aggressive osseous abnormalities. Mild DDD within the mid aspect of the thoracic spine with mildly accentuated thoracic kyphosis.  Regional soft tissues appear normal.  IMPRESSION: 1. No acute cardiopulmonary disease. Specifically, no focal airspace opacities to suggest pneumonia. 2. Indeterminate punctate (approximately 5 mm) subpleural nodule within the right lower lobe. Comparison  with prior outside examinations is recommended. If the patient is at high risk for bronchogenic carcinoma, follow-up chest CT at 6-12 months is recommended. If the patient is at low risk for bronchogenic carcinoma, follow-up chest CT at 12 months is recommended. This recommendation follows the consensus statement: Guidelines for Management of Small Pulmonary Nodules Detected on CT Scans: A Statement from the [place] as published in Radiology 2005;237:395-400. 3. Ill-defined hypoattenuating hepatic and splenic lesions, incompletely characterized on the present examination though favored to represent cysts. 4. Findings compatible with multi nodular goiter with substernal extension of the left lobe of the thyroid to near the level of the carina with associated rightward deviation of the tracheal air column as demonstrated on prior chest radiographs.   Electronically Signed   By: Sandi Mariscal M.D.   On: 02/17/2015 07:10      ASSESSMENT AND PLAN: 1. Chest pain: Noncardiac. Normal troponins. Will review echocardiogram. Given risk factors (HTN, hyperlipidemia, prior tobacco abuse), outpatient stress test can be considered. Encouraged patient to obtain PCP.  2.  Hyperlipidemia: LDL 151. Now on Lipitor started last night.  3. Multinodular goiter: TSH normal. Free T4, T3 pending. Will need outpatient follow up.  4. Cough/shortness of breath/general malaise: Symptomatology consistent with a viral etiology. WBC normal today. Will obtain internal medicine consult.  5. Pulmonary nodule: Follow up with CT in 6-12 months given h/o tobacco abuse in past.  6. Hepatic and splenic cysts: Likely of benign etiology.  Dispo: IM consult. Probable discharge within next 24 hours.  Kate Sable, M.D., F.A.C.C.

## 2015-02-17 NOTE — Progress Notes (Signed)
Echocardiogram 2D Echocardiogram has been performed.  Tresa Res 02/17/2015, 10:38 AM

## 2015-02-17 NOTE — Progress Notes (Signed)
RT assessed pt. And changed tx. Schedule to prn. Pt. Has no respiratory hx, no sob and no wheezing. No respiratory issues at this time.

## 2015-02-18 ENCOUNTER — Encounter (HOSPITAL_COMMUNITY): Payer: Self-pay | Admitting: Nurse Practitioner

## 2015-02-18 DIAGNOSIS — R05 Cough: Secondary | ICD-10-CM | POA: Diagnosis not present

## 2015-02-18 DIAGNOSIS — R0789 Other chest pain: Secondary | ICD-10-CM

## 2015-02-18 DIAGNOSIS — R079 Chest pain, unspecified: Secondary | ICD-10-CM | POA: Diagnosis not present

## 2015-02-18 DIAGNOSIS — R9431 Abnormal electrocardiogram [ECG] [EKG]: Secondary | ICD-10-CM | POA: Diagnosis not present

## 2015-02-18 DIAGNOSIS — I10 Essential (primary) hypertension: Secondary | ICD-10-CM | POA: Diagnosis not present

## 2015-02-18 MED ORDER — AZELASTINE HCL 0.1 % NA SOLN: 2.0000 | Freq: Two times a day (BID) | NASAL | Status: DC

## 2015-02-18 MED ORDER — ASPIRIN 81 MG PO TBEC: 81.0000 mg | DELAYED_RELEASE_TABLET | Freq: Every day | ORAL | Status: DC

## 2015-02-18 MED ORDER — NITROGLYCERIN 0.4 MG SL SUBL: 0.4000 mg | SUBLINGUAL_TABLET | SUBLINGUAL | Status: DC | PRN

## 2015-02-18 MED ORDER — ATORVASTATIN CALCIUM 40 MG PO TABS: 40.0000 mg | ORAL_TABLET | Freq: Every day | ORAL | Status: DC

## 2015-02-18 MED ORDER — LISINOPRIL 20 MG PO TABS: 20.0000 mg | ORAL_TABLET | Freq: Every day | ORAL | Status: DC

## 2015-02-18 NOTE — Discharge Summary (Signed)
Discharge Summary   Patient ID: Darlene Griffith,  MRN: [id number], DOB/AGE: 1951/08/25 64 y.o.  Admit date: 02/16/2015 Discharge date: 02/18/2015  Primary Care Provider: No PCP Per Patient Primary Cardiologist: new - will f/u in [place] office.  Discharge Diagnoses Principal Problem:   Midsternal chest pain  **No objective evidence of ischemia - felt to be non-cardiac.  **Nl EF by echo.  Active Problems:   Bronchitis   Essential hypertension   Lung nodule   Splenic cyst   Hepatic cyst   Hyperlipidemia   Allergies No Known Allergies  Procedures  2D Echocardiogram 4.30.2016  Study Conclusions  - Left ventricle: The cavity size was normal. Systolic function was   normal. Wall motion was normal; there were no regional wall   motion abnormalities. Doppler parameters are consistent with   abnormal left ventricular relaxation (grade 1 diastolic   dysfunction). - Aortic valve: There was trivial regurgitation. Valve area (VTI):   2.43 cm^2. Valve area (Vmax): 2.1 cm^2. Valve area (Vmean): 2.22   cm^2. - Mitral valve: There was mild regurgitation. _____________   CT of the Chest without Contrast  4.30.2016  IMPRESSION: 1. No acute cardiopulmonary disease. Specifically, no focal airspace opacities to suggest pneumonia. 2. Indeterminate punctate (approximately 5 mm) subpleural nodule within the right lower lobe. Comparison with prior outside examinations is recommended. If the patient is at high risk for bronchogenic carcinoma, follow-up chest CT at 6-12 months is recommended. If the patient is at low risk for bronchogenic carcinoma, follow-up chest CT at 12 months is recommended. This recommendation follows the consensus statement: Guidelines for Management of Small Pulmonary Nodules Detected on CT Scans: A Statement from the [place] as published in Radiology 2005;237:395-400. 3. Ill-defined hypoattenuating hepatic and splenic lesions, incompletely characterized  on the present examination though favored to represent cysts. 4. Findings compatible with multi nodular goiter with substernal extension of the left lobe of the thyroid to near the level of the carina with associated rightward deviation of the tracheal air column as demonstrated on prior chest radiographs. _____________   History of Present Illness  64 year old female with a prior history of hypertension and multinodular goiter. She does not have a primary care provider. Approximately 2 days prior to admission she began to experience sharp, left-sided chest discomfort lasting 1-2 minutes and resolving spontaneously. There were no associated symptoms with the discomfort, though she has been having a cough recently. She presented to the [facility] emergency department for evaluation where ECG was nonacute and troponin was normal. She was admitted for further evaluation.  Hospital Course  Patient ruled out for myocardial infarction. She continued to have diffuse somatic complaints. Echocardiogram was performed and showed normal LV function. CT of the chest without contrast was also performed. This showed no focal airspace opacities to suggest pneumonia. There was a 5 mm subpleural right lower lobe nodule with recommendation for follow-up CT in 6-12 months. Also ill-defined hypoattenuating hepatic and splenic lesions which were favored to represent cysts. Finally, findings are compatible with multinodular goiter with substernal extension of the left lobe of the thyroid near the level of the carina with associated rightward deviation of the tracheal air column as was previously noted on prior x-rays. Notably, TSH was normal.  In the absence of objective evidence of ischemia, we plan to discharge Darlene Griffith home today. We have initiated lisinopril therapy in the setting of previously untreated hypertension and also place her on a moderate dose statin given total cholesterol 213 with an  LDL of 151. She has  been counseled on the importance of medication compliance and also primary care follow-up especially in light of pulmonary nodule and multinodular goiter.  Discharge Vitals Blood pressure 137/84, pulse 82, temperature 98.4 F (36.9 C), temperature source Oral, resp. rate 18, height 5\' 1"  (1.549 m), weight 156 lb 15.5 oz (71.2 kg), SpO2 100 %.  Filed Weights   02/16/15 1837 02/16/15 2348  Weight: 156 lb (70.761 kg) 156 lb 15.5 oz (71.2 kg)   Labs  CBC  Recent Labs  02/16/15 1922 02/17/15 0017  WBC 10.9* 9.8  NEUTROABS 6.8  --   HGB 13.6 12.5  HCT 40.8 38.1  MCV 104.9* 103.8*  PLT 280 366   Basic Metabolic Panel  Recent Labs  02/16/15 1922 02/17/15 0017  NA 141  --   K 3.2*  --   CL 110  --   CO2 23  --   GLUCOSE 106*  --   BUN 8  --   CREATININE 0.47* 0.62  CALCIUM 8.5  --    Liver Function Tests  Recent Labs  02/16/15 1922  AST 15  ALT 13  ALKPHOS 92  BILITOT 0.5  PROT 7.2  ALBUMIN 4.0   Cardiac Enzymes  Recent Labs  02/17/15 0017 02/17/15 0501 02/17/15 1241  TROPONINI <0.03 <0.03 0.03   Fasting Lipid Panel  Recent Labs  02/17/15 0501  CHOL 213*  HDL 37*  LDLCALC 151*  TRIG 124  CHOLHDL 5.8   Thyroid Function Tests  Recent Labs  02/17/15 0501  TSH 1.074    Disposition  Pt is being discharged home today in good condition.  Follow-up Plans & Appointments  Follow-up Information    Follow up with Obtain a primary care provider In 1 week.      Follow up with [facility].   Why:  we will arrange for follow-up and contact you.   Contact information:   [place] [phone]     Discharge Medications    Medication List    TAKE these medications        aspirin 81 MG EC tablet  Take 1 tablet (81 mg total) by mouth daily.     atorvastatin 40 MG tablet  Commonly known as:  LIPITOR  Take 1 tablet (40 mg total) by mouth daily  at 6 PM.     azelastine 0.1 % nasal spray  Commonly known as:  ASTELIN  Place 2 sprays into both nostrils 2 (two) times daily. Use in each nostril as directed     lisinopril 20 MG tablet  Commonly known as:  PRINIVIL,ZESTRIL  Take 1 tablet (20 mg total) by mouth daily.     nitroGLYCERIN 0.4 MG SL tablet  Commonly known as:  NITROSTAT  Place 1 tablet (0.4 mg total) under the tongue every 5 (five) minutes x 3 doses as needed for chest pain.        Outstanding Labs/Studies  F/U Chest CT in 6-12 mos. F/U Lipids/LFT's in 8 wks.  Duration of Discharge Encounter   Greater than 30 minutes including physician time.  Signed, Murray Hodgkins NP 02/18/2015, 11:54 AM

## 2015-02-18 NOTE — Discharge Instructions (Signed)
**  PLEASE REMEMBER TO BRING ALL OF YOUR MEDICATIONS TO EACH OF YOUR FOLLOW-UP OFFICE VISITS.  You will need to obtain an primary care provider ASAP.

## 2015-02-18 NOTE — Progress Notes (Signed)
SUBJECTIVE: Feeling better. Denies chest pain. Can breathe through her nose after Astelin.     Intake/Output Summary (Last 24 hours) at 02/18/15 1116 Last data filed at 02/17/15 1800  Gross per 24 hour  Intake    480 ml  Output      1 ml  Net    479 ml    Current Facility-Administered Medications  Medication Dose Route Frequency Provider Last Rate Last Dose  . acetaminophen (TYLENOL) tablet 650 mg  650 mg Oral Q4H PRN Javier Docker, MD   650 mg at 02/18/15 0229  . aspirin EC tablet 81 mg  81 mg Oral Daily Javier Docker, MD   81 mg at 02/18/15 0935  . atorvastatin (LIPITOR) tablet 40 mg  40 mg Oral q1800 Javier Docker, MD   40 mg at 02/17/15 1639  . azelastine (ASTELIN) 0.1 % nasal spray 2 spray  2 spray Each Nare BID Javier Docker, MD   2 spray at 02/18/15 1000  . enoxaparin (LOVENOX) injection 40 mg  40 mg Subcutaneous Daily Javier Docker, MD   40 mg at 02/18/15 0935  . HYDROcodone-homatropine (HYCODAN) 5-1.5 MG/5ML syrup 5 mL  5 mL Oral Q4H PRN Javier Docker, MD   5 mL at 02/17/15 2036  . ipratropium-albuterol (DUONEB) 0.5-2.5 (3) MG/3ML nebulizer solution 3 mL  3 mL Nebulization Q6H PRN Jerline Pain, MD      . lisinopril (PRINIVIL,ZESTRIL) tablet 20 mg  20 mg Oral Daily Javier Docker, MD   20 mg at 02/18/15 0935  . nitroGLYCERIN (NITROSTAT) SL tablet 0.4 mg  0.4 mg Sublingual Q5 Min x 3 PRN Javier Docker, MD      . ondansetron [facility]) injection 4 mg  4 mg Intravenous Q6H PRN Javier Docker, MD      . potassium chloride SA (K-DUR,KLOR-CON) CR tablet 40 mEq  40 mEq Oral BID Javier Docker, MD   40 mEq at 02/18/15 0935    Filed Vitals:   02/17/15 1602 02/17/15 2046 02/18/15 0222 02/18/15 0624  BP: 166/93 148/91 148/89 137/84  Pulse: 95 91 77 82  Temp:  98.5 F (36.9 C)  98.4 F (36.9 C)  TempSrc:  Oral  Oral  Resp: 18 18  18   Height:      Weight:      SpO2: 100% 100%  100%    PHYSICAL EXAM General: NAD HEENT: Normal. Neck: No JVD, no thyromegaly.  Lungs: Clear to auscultation bilaterally  with normal respiratory effort. CV: Nondisplaced PMI. Regular rate and rhythm, normal S1/S2, no O1/Y0, soft 1/6 systolic murmur over RUSB. No pretibial edema.  Abdomen: Soft, nontender, no distention.  Neurologic: Alert and oriented x 3.  Psych: Normal affect. Musculoskeletal: Normal range of motion. No gross deformities. Extremities: No clubbing or cyanosis.   TELEMETRY: Reviewed telemetry pt in sinus rhythm.  LABS: Basic Metabolic Panel:  Recent Labs  02/16/15 1922 02/17/15 0017  NA 141  --   K 3.2*  --   CL 110  --   CO2 23  --   GLUCOSE 106*  --   BUN 8  --   CREATININE 0.47* 0.62  CALCIUM 8.5  --    Liver Function Tests:  Recent Labs  02/16/15 1922  AST 15  ALT 13  ALKPHOS 92  BILITOT 0.5  PROT 7.2  ALBUMIN 4.0   No results for input(s): LIPASE, AMYLASE in the last 72 hours. CBC:  Recent Labs  02/16/15 1922 02/17/15 0017  WBC 10.9* 9.8  NEUTROABS 6.8  --   HGB 13.6 12.5  HCT 40.8 38.1  MCV 104.9* 103.8*  PLT 280 290   Cardiac Enzymes:  Recent Labs  02/17/15 0017 02/17/15 0501 02/17/15 1241  TROPONINI <0.03 <0.03 0.03   BNP: Invalid input(s): POCBNP D-Dimer: No results for input(s): DDIMER in the last 72 hours. Hemoglobin A1C: No results for input(s): HGBA1C in the last 72 hours. Fasting Lipid Panel:  Recent Labs  02/17/15 0501  CHOL 213*  HDL 37*  LDLCALC 151*  TRIG 124  CHOLHDL 5.8   Thyroid Function Tests:  Recent Labs  02/17/15 0501  TSH 1.074   Anemia Panel: No results for input(s): VITAMINB12, FOLATE, FERRITIN, TIBC, IRON, RETICCTPCT in the last 72 hours.  RADIOLOGY: Dg Chest 2 View  02/16/2015   CLINICAL DATA:  Cough and fever.  One day history of chest pain  EXAM: CHEST  2 VIEW  COMPARISON:  July 29, 2008  FINDINGS: There is no edema or consolidation. Heart size and pulmonary vascularity are normal. No adenopathy. There is rightward deviation of the thoracic trachea. No bone lesions.  IMPRESSION: Rightward  deviation of the thoracic trachea, finding that was also present on prior study. The most likely etiology for this finding is thyroid enlargement. No edema or consolidation.   Electronically Signed   By: Lowella Grip III M.D.   On: 02/16/2015 20:14   Ct Chest Wo Contrast  02/17/2015   CLINICAL DATA:  Body aches, chills and cough.  EXAM: CT CHEST WITHOUT CONTRAST  TECHNIQUE: Multidetector CT imaging of the chest was performed following the standard protocol without IV contrast.  COMPARISON:  Chest radiograph- 02/16/2015; 09/28/2008  FINDINGS: There is minimal subsegmental atelectasis within the right costophrenic angle (image 38, series 3) as well as is the lingula (image 29, series 3). No discrete focal airspace opacities. No pleural effusion or pneumothorax.  There is rightward deviation of the trachea secondary to substernal extension of a heterogeneous and enlarged left lobe of the thyroid which contains innumerable ill-defined hypo attenuating nodules with dominant nodule within the caudal aspect of the left lobe of the thyroid measuring approximate 1.5 x 2.2 cm (image 15, series 2) and dominant ill-defined nodule/mass within the right lobe of the thyroid measuring approximately 2.4 x 2.7 cm (image 1, series 2). The left lobe of the thyroid extends to near the level of the carina. The central pulmonary airways remain patent.  Punctate (approximately 5 mm) subpleural nodule within the right lower lobe (image 30, series 3). Scattered shotty mediastinal lymph nodes individually not enlarged by size criteria. No mediastinal, hilar axillary lymphadenopathy on this noncontrast examination.  Normal heart size. No pericardial effusion. Normal caliber of the thoracic aorta. No intramural hematoma.  Limited noncontrast evaluation of the upper abdomen demonstrates multiple ill-defined hypo attenuating hepatic lesions with dominant lesion within the posterior aspect of the lateral segment of the left lobe of the  liver measuring approximately 1.6 cm in diameter (image 47, series 2) and while incompletely characterized on the present examination, in the absence of a known primary malignancy is favored are favored to represent hepatic cysts. Ill-defined punctate (approximately 6 mm) hypoattenuating splenic lesion, likely too small to adequately characterize of favored to represent a splenic cyst. No perisplenic stranding.  No acute or aggressive osseous abnormalities. Mild DDD within the mid aspect of the thoracic spine with mildly accentuated thoracic kyphosis.  Regional soft tissues appear normal.  IMPRESSION: 1. No acute cardiopulmonary disease. Specifically, no  focal airspace opacities to suggest pneumonia. 2. Indeterminate punctate (approximately 5 mm) subpleural nodule within the right lower lobe. Comparison with prior outside examinations is recommended. If the patient is at high risk for bronchogenic carcinoma, follow-up chest CT at 6-12 months is recommended. If the patient is at low risk for bronchogenic carcinoma, follow-up chest CT at 12 months is recommended. This recommendation follows the consensus statement: Guidelines for Management of Small Pulmonary Nodules Detected on CT Scans: A Statement from the [place] as published in Radiology 2005;237:395-400. 3. Ill-defined hypoattenuating hepatic and splenic lesions, incompletely characterized on the present examination though favored to represent cysts. 4. Findings compatible with multi nodular goiter with substernal extension of the left lobe of the thyroid to near the level of the carina with associated rightward deviation of the tracheal air column as demonstrated on prior chest radiographs.   Electronically Signed   By: Sandi Mariscal M.D.   On: 02/17/2015 07:10      ASSESSMENT AND PLAN: 1. Chest pain: Noncardiac. Normal troponins. Echocardiogram showed normal LV systolic function and regional wall motion. Given risk factors (HTN, hyperlipidemia,  prior tobacco abuse), outpatient stress test can be considered. Encouraged patient to obtain PCP.  2. Hyperlipidemia: LDL 151. Now on Lipitor.  3. Multinodular goiter: TSH and free T4 normal. Will need outpatient follow up.  4. Cough/shortness of breath/general malaise: Symptomatology consistent with a viral etiology. WBC normal on 4/30.   5. Pulmonary nodule: Follow up with CT in 6-12 months given h/o tobacco abuse in past.  6. Hepatic and splenic cysts: Likely of benign etiology.  Dispo: D/c to home today.  Kate Sable, M.D., F.A.C.C.

## 2015-02-19 LAB — T3, FREE: T3, Free: 2.5 pg/mL (ref 2.0–4.4)

## 2015-02-19 LAB — HEMOGLOBIN A1C
Hgb A1c MFr Bld: 5.5 % (ref 4.8–5.6)
Mean Plasma Glucose: 111 mg/dL

## 2015-02-27 ENCOUNTER — Encounter: Payer: Self-pay | Admitting: Nurse Practitioner

## 2015-02-27 ENCOUNTER — Ambulatory Visit (INDEPENDENT_AMBULATORY_CARE_PROVIDER_SITE_OTHER): Payer: 59 | Admitting: Nurse Practitioner

## 2015-02-27 VITALS — BP 110/70 | HR 98 | Ht 61.0 in | Wt 157.8 lb

## 2015-02-27 DIAGNOSIS — E785 Hyperlipidemia, unspecified: Secondary | ICD-10-CM | POA: Diagnosis not present

## 2015-02-27 DIAGNOSIS — R0789 Other chest pain: Secondary | ICD-10-CM

## 2015-02-27 DIAGNOSIS — R079 Chest pain, unspecified: Secondary | ICD-10-CM

## 2015-02-27 DIAGNOSIS — R5383 Other fatigue: Secondary | ICD-10-CM | POA: Diagnosis not present

## 2015-02-27 LAB — URINALYSIS, ROUTINE W REFLEX MICROSCOPIC
Bilirubin Urine: NEGATIVE
Ketones, ur: NEGATIVE
Leukocytes, UA: NEGATIVE
Nitrite: NEGATIVE
Specific Gravity, Urine: >=1.03 — AB (ref 1.000–1.030)
Total Protein, Urine: NEGATIVE
Urine Glucose: NEGATIVE
Urobilinogen, UA: 0.2 (ref 0.0–1.0)
pH: 5.5 (ref 5.0–8.0)

## 2015-02-27 NOTE — Patient Instructions (Signed)
We will be checking the following labs today - BMET, CBC, HPF, stat troponin  Lab in 2 months - lipids and HPF   Medication Instructions:    Continue with your current medicines.   Try to pick up the Lipitor as soon as you can    Testing/Procedures To Be Arranged:  [place] Myoview  Follow-Up:   We will arrange for primary care visit    Other Special Instructions:   N/A  Call the [place] office at [phone] if you have any questions, problems or concerns.

## 2015-02-27 NOTE — Progress Notes (Addendum)
CARDIOLOGY OFFICE NOTE  Date:  02/27/2015    Abigail Butts Date of Birth: 11-16-1950 Medical Record #[id number]  PCP:  No PCP Per Patient  Cardiologist:  UNKNOWN   Chief Complaint  Patient presents with  . Chest Pain    Post hospital visit - does not have primary cardiologist determined.      History of Present Illness: Darlene Griffith is a 64 y.o. female who presents today for a post hospital visit. She is new to cardiology. Does not have following cardiologist listed - was to establish in the office. She has a history of goiter, HLD and HTN. Former smoker.   Presented to [facility] last month with atypical chest pain, cough, and fatigue. Has not had regular medical care - generally avoids physicians.   Comes in today. Here alone. Has no PCP. She was lying on her side on the exam table when I came into the room. Says she cannot keep her eyes open. She is very fatigued. Continues to have some sharp chest pain - off and on - nothing exertional. No fever or chills. Had diarrhea earlier today. Vomited earlier this AM. Only taking her Lisinopril - has not picked up her Lipitor yet. She is aware of the nodule in her lung noted on CT.   Past Medical History  Diagnosis Date  . Essential hypertension   . Pulmonary nodules     a. 01/2015 CT Chest: RLL ~ 82mm subpleural nodule - rec f/u in 6-12 mos.  . Multinodular goiter     a. 01/2015 CT chest: multinodular goidter w/ substernal extension of the left lobe of the thyroid assoc w/ rightward deviation of tracheal air column.  . Chest pain     a. 01/2015 Echo: Nl LV fxn, Gr 1 DD, triv AI, mild MR.  . Hepatic cyst     a. noted on CT 01/2015.  [name] Splenic cyst     a. noted on CT 01/2015.    Past Surgical History  Procedure Laterality Date  . Cesarean section       Medications: Current Outpatient Prescriptions  Medication Sig Dispense Refill  . azelastine (ASTELIN) 0.1 % nasal spray Place 2 sprays into both nostrils 2 (two) times daily.  Use in each nostril as directed 30 mL 6  . lisinopril (PRINIVIL,ZESTRIL) 20 MG tablet Take 1 tablet (20 mg total) by mouth daily. 30 tablet 6  . aspirin EC 81 MG EC tablet Take 1 tablet (81 mg total) by mouth daily. (Patient not taking: Reported on 02/27/2015)    . atorvastatin (LIPITOR) 40 MG tablet Take 1 tablet (40 mg total) by mouth daily at 6 PM. (Patient not taking: Reported on 02/27/2015) 30 tablet 6  . nitroGLYCERIN (NITROSTAT) 0.4 MG SL tablet Place 1 tablet (0.4 mg total) under the tongue every 5 (five) minutes x 3 doses as needed for chest pain. (Patient not taking: Reported on 02/27/2015) 25 tablet 3   No current facility-administered medications for this visit.    Allergies: No Known Allergies  Social History: The patient  reports that she has quit smoking. She does not have any smokeless tobacco history on file. She reports that she does not drink alcohol or use illicit drugs.   Family History: The patient's family history includes Heart attack in her maternal grandmother; High Cholesterol in her mother; High blood pressure in her mother.   Review of Systems: Please see the history of present illness.   Otherwise, the review of  systems is positive for fatigue. She continues to have chest pain, leg swelling, and "just no energy".   All other systems are reviewed and negative.   Physical Exam: VS:  BP 110/70 mmHg  Pulse 98  Ht 5\' 1"  (1.549 m)  Wt 157 lb 12.8 oz (71.578 kg)  BMI 29.83 kg/m2 .  BMI Body mass index is 29.83 kg/(m^2).  Wt Readings from Last 3 Encounters:  02/27/15 157 lb 12.8 oz (71.578 kg)  02/16/15 156 lb 15.5 oz (71.2 kg)    General: She is quite sleepy/lethargic but arousable and follows commands.  HEENT: Normal. Neck: Supple, no JVD, carotid bruits, or masses noted.  Cardiac: Regular rate and rhythm. +S4 noted. No edema.  Respiratory:  Lungs are clear to auscultation bilaterally with normal work of breathing.  GI: Soft and nontender.  MS: No deformity  or atrophy. Gait and ROM intact. Skin: Warm and dry. Color is normal.  Neuro:  Strength and sensation are intact and no gross focal deficits noted.  Psych: Alert, appropriate and with normal affect.   LABORATORY DATA:  EKG:  EKG is ordered today. This demonstrates NSR with non specific changes. It is unchanged.   Lab Results  Component Value Date   WBC 9.8 02/17/2015   HGB 12.5 02/17/2015   HCT 38.1 02/17/2015   PLT 290 02/17/2015   GLUCOSE 106* 02/16/2015   CHOL 213* 02/17/2015   TRIG 124 02/17/2015   HDL 37* 02/17/2015   LDLCALC 151* 02/17/2015   ALT 13 02/16/2015   AST 15 02/16/2015   NA 141 02/16/2015   K 3.2* 02/16/2015   CL 110 02/16/2015   CREATININE 0.62 02/17/2015   BUN 8 02/16/2015   CO2 23 02/16/2015   TSH 1.074 02/17/2015   INR 0.9 09/28/2008   HGBA1C 5.5 02/17/2015    BNP (last 3 results)  Recent Labs  02/16/15 1922  BNP 29.2    ProBNP (last 3 results) No results for input(s): PROBNP in the last 8760 hours.   Other Studies Reviewed Today:  Echo Study Conclusions from 02/2015  - Left ventricle: The cavity size was normal. Systolic function was normal. Wall motion was normal; there were no regional wall motion abnormalities. Doppler parameters are consistent with abnormal left ventricular relaxation (grade 1 diastolic dysfunction). - Aortic valve: There was trivial regurgitation. Valve area (VTI): 2.43 cm^2. Valve area (Vmax): 2.1 cm^2. Valve area (Vmean): 2.22 cm^2. - Mitral valve: There was mild regurgitation.   CT CHEST IMPRESSION: 1. No acute cardiopulmonary disease. Specifically, no focal airspace opacities to suggest pneumonia. 2. Indeterminate punctate (approximately 5 mm) subpleural nodule within the right lower lobe. Comparison with prior outside examinations is recommended. If the patient is at high risk for bronchogenic carcinoma, follow-up chest CT at 6-12 months is recommended. If the patient is at low risk for  bronchogenic carcinoma, follow-up chest CT at 12 months is recommended. This recommendation follows the consensus statement: Guidelines for Management of Small Pulmonary Nodules Detected on CT Scans: A Statement from the [place] as published in Radiology 2005;237:395-400. 3. Ill-defined hypoattenuating hepatic and splenic lesions, incompletely characterized on the present examination though favored to represent cysts. 4. Findings compatible with multi nodular goiter with substernal extension of the left lobe of the thyroid to near the level of the carina with associated rightward deviation of the tracheal air column as demonstrated on prior chest radiographs.   Electronically Signed  By: Sandi Mariscal M.D.  On: 02/17/2015 07:10  Assessment/Plan:  1. Chest  pain: Noncardiac. Recent admission for chest pain and had normal troponins. Echocardiogram showed normal LV systolic function and regional wall motion. Multiple CV risk factors (HTN, hyperlipidemia, prior tobacco abuse). Would arrange outpatient stress testing. Recheck her troponin today.   2. Significant fatigue - recheck her labs today. Having some GI component (diarrhea and vomiting earlier today) as well - may be viral  3. Hyperlipidemia: Has not started her Lipitor. Needs labs in 8 weeks.  4. Multinodular goiter: TSH and free T4 normal. Needs to establish with PCP  5. Pulmonary nodule: Follow up with CT in 6-12 months given h/o tobacco abuse in past. She has been made aware of this  6. Hepatic and splenic cysts: Likely of benign etiology per prior note. I would suggest follow up with PCP. Will refer to primary care.   Current medicines are reviewed with the patient today.  The patient does not have concerns regarding medicines other than what has been noted above.  The following changes have been made:  See above.  Labs/ tests ordered today include:    Orders Placed This Encounter  Procedures  . Basic  metabolic panel  . CBC with Differential/Platelet  . Troponin I  . Hepatic function panel  . Urinalysis  . Hepatic function panel  . Lipid panel  . Ambulatory referral to Internal Medicine  . Myocardial Perfusion Imaging  . EKG 12-Lead     Disposition:   Further disposition to follow.   Patient is agreeable to this plan and will call if any problems develop in the interim.   Signed: Burtis Junes, RN, ANP-C 02/27/2015 3:01 PM  [facility] [address] [place], [place]  [address] Phone: [phone] Fax: [phone]      Addendum:  Patient was sent to the lab - attempted venipuncture - jerked her arm - now refusing to have labs drawn. Will only be able to send the urine.  Burtis Junes, RN, [place] [address] [place], [place]  [address] [phone]

## 2015-02-27 NOTE — Addendum Note (Signed)
Addended by: Burtis Junes on: 02/27/2015 03:22 PM   Modules accepted: Orders

## 2015-03-08 ENCOUNTER — Encounter: Payer: Self-pay | Admitting: *Deleted

## 2015-03-13 ENCOUNTER — Encounter (HOSPITAL_COMMUNITY): Payer: 59

## 2015-04-30 ENCOUNTER — Other Ambulatory Visit: Payer: 59

## 2016-07-25 ENCOUNTER — Emergency Department (HOSPITAL_COMMUNITY)
Admission: EM | Admit: 2016-07-25 | Discharge: 2016-07-25 | Disposition: A | Discharge status: Home / Self Care | Payer: Medicare Other | Attending: Emergency Medicine | Admitting: Emergency Medicine

## 2016-07-25 ENCOUNTER — Emergency Department (HOSPITAL_COMMUNITY): Payer: Medicare Other

## 2016-07-25 DIAGNOSIS — Z791 Long term (current) use of non-steroidal anti-inflammatories (NSAID): Secondary | ICD-10-CM | POA: Diagnosis not present

## 2016-07-25 DIAGNOSIS — Z7982 Long term (current) use of aspirin: Secondary | ICD-10-CM | POA: Diagnosis not present

## 2016-07-25 DIAGNOSIS — R471 Dysarthria and anarthria: Secondary | ICD-10-CM | POA: Insufficient documentation

## 2016-07-25 DIAGNOSIS — Z5181 Encounter for therapeutic drug level monitoring: Secondary | ICD-10-CM | POA: Insufficient documentation

## 2016-07-25 DIAGNOSIS — I10 Essential (primary) hypertension: Secondary | ICD-10-CM | POA: Diagnosis not present

## 2016-07-25 DIAGNOSIS — Z87891 Personal history of nicotine dependence: Secondary | ICD-10-CM | POA: Diagnosis not present

## 2016-07-25 DIAGNOSIS — R51 Headache: Secondary | ICD-10-CM | POA: Diagnosis not present

## 2016-07-25 DIAGNOSIS — R29810 Facial weakness: Secondary | ICD-10-CM | POA: Diagnosis not present

## 2016-07-25 LAB — CBC
HEMATOCRIT: 40.4 % (ref 36.0–46.0)
HEMOGLOBIN: 13.4 g/dL (ref 12.0–15.0)
MCH: 34 pg (ref 26.0–34.0)
MCHC: 33.2 g/dL (ref 30.0–36.0)
MCV: 102.5 fL — AB (ref 78.0–100.0)
Platelets: 266 10*3/uL (ref 150–400)
RBC: 3.94 MIL/uL (ref 3.87–5.11)
RDW: 11.8 % (ref 11.5–15.5)
WBC: 10.9 10*3/uL — ABNORMAL HIGH (ref 4.0–10.5)

## 2016-07-25 LAB — COMPREHENSIVE METABOLIC PANEL
ALBUMIN: 4.4 g/dL (ref 3.5–5.0)
ALK PHOS: 85 U/L (ref 38–126)
ALT: 11 U/L — ABNORMAL LOW (ref 14–54)
ANION GAP: 7 (ref 5–15)
AST: 17 U/L (ref 15–41)
BILIRUBIN TOTAL: 0.3 mg/dL (ref 0.3–1.2)
BUN: 7 mg/dL (ref 6–20)
CALCIUM: 9.4 mg/dL (ref 8.9–10.3)
CO2: 26 mmol/L (ref 22–32)
Chloride: 109 mmol/L (ref 101–111)
Creatinine, Ser: 0.64 mg/dL (ref 0.44–1.00)
GFR calc Af Amer: >60 mL/min (ref >60)
GLUCOSE: 90 mg/dL (ref 65–99)
Potassium: 3.4 mmol/L — ABNORMAL LOW (ref 3.5–5.1)
Sodium: 142 mmol/L (ref 135–145)
TOTAL PROTEIN: 7.5 g/dL (ref 6.5–8.1)

## 2016-07-25 LAB — I-STAT CHEM 8, ED
BUN: 5 mg/dL — AB (ref 6–20)
CALCIUM ION: 1.21 mmol/L (ref 1.15–1.40)
CREATININE: 0.6 mg/dL (ref 0.44–1.00)
Chloride: 104 mmol/L (ref 101–111)
GLUCOSE: 87 mg/dL (ref 65–99)
HEMATOCRIT: 41 % (ref 36.0–46.0)
HEMOGLOBIN: 13.9 g/dL (ref 12.0–15.0)
Potassium: 3.4 mmol/L — ABNORMAL LOW (ref 3.5–5.1)
Sodium: 144 mmol/L (ref 135–145)
TCO2: 28 mmol/L (ref 0–100)

## 2016-07-25 LAB — DIFFERENTIAL
Basophils Absolute: 0 10*3/uL (ref 0.0–0.1)
Basophils Relative: 0 %
EOS ABS: 0.2 10*3/uL (ref 0.0–0.7)
EOS PCT: 2 %
LYMPHS ABS: 5 10*3/uL — AB (ref 0.7–4.0)
LYMPHS PCT: 46 %
MONO ABS: 0.5 10*3/uL (ref 0.1–1.0)
MONOS PCT: 5 %
NEUTROS PCT: 47 %
Neutro Abs: 5.2 10*3/uL (ref 1.7–7.7)

## 2016-07-25 LAB — I-STAT TROPONIN, ED: TROPONIN I, POC: 0.01 ng/mL (ref 0.00–0.08)

## 2016-07-25 LAB — PROTIME-INR
INR: 0.87
Prothrombin Time: 11.8 seconds (ref 11.4–15.2)

## 2016-07-25 LAB — APTT: aPTT: 28 seconds (ref 24–36)

## 2016-07-25 LAB — ETHANOL: Alcohol, Ethyl (B): <5 mg/dL (ref <5)

## 2016-07-25 MED ORDER — ACETAMINOPHEN 325 MG PO TABS
650.0000 mg | ORAL_TABLET | Freq: Once | ORAL | Status: AC
  Administered 2016-07-25: 650 mg via ORAL
  Filled 2016-07-25: qty 2

## 2016-07-25 NOTE — ED Triage Notes (Signed)
Per patient, states she was watching TV on Wednesday and heard something pop on right side of head-states severe head ache-states speech slurred-was scared to come to ED at the time symptoms occurred because she was afraid she was having a stroke-states history of HTN but has not taken meds in years-patient also smokes

## 2016-07-25 NOTE — ED Notes (Signed)
Patient transported to MRI 

## 2016-07-25 NOTE — ED Notes (Signed)
MD at bedside. Dr. Leonette Monarch at bedside.

## 2016-07-25 NOTE — ED Provider Notes (Signed)
[facility] DEPT Provider Note   CSN: [id number] Arrival date & time: 07/25/16  1813     History   Chief Complaint Chief Complaint  Patient presents with  . Aphasia    HPI Darlene Griffith is a 65 y.o. female.  Patient with past medical history of hypertension and hyperlipidemia presents to the emergency department with chief complaint of reported a station facial droop. Patient states that 3 days ago she had a headache, and felt like the right several face was numb. She reports having associated aphasia. She states that her symptoms are mostly improved, but still reports mild headache and feels that her speech is not quite normal.  She denies any numbness, weakness, or tingling of her extremities. Denies any chest pain or shortness of breath. There are no modifying factors. She has tried taking OTC medication with no relief.   The history is provided by the patient. No language interpreter was used.    Past Medical History:  Diagnosis Date  . Chest pain    a. 01/2015 Echo: Nl LV fxn, Gr 1 DD, triv AI, mild MR.  . Essential hypertension   . Hepatic cyst    a. noted on CT 01/2015.  . Multinodular goiter    a. 01/2015 CT chest: multinodular goidter w/ substernal extension of the left lobe of the thyroid assoc w/ rightward deviation of tracheal air column.  . Pulmonary nodules    a. 01/2015 CT Chest: RLL ~ 43mm subpleural nodule - rec f/u in 6-12 mos.  [name] Splenic cyst    a. noted on CT 01/2015.    Patient Active Problem List   Diagnosis Date Noted  . Midsternal chest pain 02/18/2015  . Bronchitis   . Chest pain   . Cough   . Essential hypertension   . Shortness of breath   . Aortic stenosis   . Pain in the chest   . EKG abnormality   . Malaise and fatigue   . Lung nodule   . Splenic cyst   . Hepatic cyst   . Hyperlipidemia   . Hypertension 02/16/2015    Past Surgical History:  Procedure Laterality Date  . CESAREAN SECTION      OB History    No data available       Home Medications    Prior to Admission medications   Medication Sig Start Date End Date Taking? Authorizing Provider  aspirin EC 81 MG EC tablet Take 1 tablet (81 mg total) by mouth daily. Patient not taking: Reported on 02/27/2015 02/18/15   Rogelia Mire, NP  atorvastatin (LIPITOR) 40 MG tablet Take 1 tablet (40 mg total) by mouth daily at 6 PM. Patient not taking: Reported on 02/27/2015 02/18/15   Rogelia Mire, NP  azelastine (ASTELIN) 0.1 % nasal spray Place 2 sprays into both nostrils 2 (two) times daily. Use in each nostril as directed 02/18/15   Rogelia Mire, NP  lisinopril (PRINIVIL,ZESTRIL) 20 MG tablet Take 1 tablet (20 mg total) by mouth daily. 02/18/15   Rogelia Mire, NP  nitroGLYCERIN (NITROSTAT) 0.4 MG SL tablet Place 1 tablet (0.4 mg total) under the tongue every 5 (five) minutes x 3 doses as needed for chest pain. Patient not taking: Reported on 02/27/2015 02/18/15   Rogelia Mire, NP    Family History Family History  Problem Relation Age of Onset  . Heart attack Maternal Grandmother     deceased  . High blood pressure Mother   .  High Cholesterol Mother     Social History Social History  Substance Use Topics  . Smoking status: Former Research scientist (life sciences)  . Smokeless tobacco: Not on file  . Alcohol use No     Allergies   Review of patient's allergies indicates no known allergies.   Review of Systems Review of Systems  All other systems reviewed and are negative.    Physical Exam Updated Vital Signs BP (!) 152/106 (BP Location: Left Arm)   Pulse 85   Resp 16   SpO2 98%   Physical Exam  Constitutional: She is oriented to person, place, and time. She appears well-developed and well-nourished.  HENT:  Head: Normocephalic and atraumatic.  Right Ear: External ear normal.  Left Ear: External ear normal.  Eyes: Conjunctivae and EOM are normal. Pupils are equal, round, and reactive to light.  Neck: Normal range of motion. Neck supple.  No  pain with neck flexion, no meningismus  Cardiovascular: Normal rate, regular rhythm and normal heart sounds.  Exam reveals no gallop and no friction rub.   No murmur heard. Pulmonary/Chest: Effort normal and breath sounds normal. No respiratory distress. She has no wheezes. She has no rales. She exhibits no tenderness.  Abdominal: Soft. Bowel sounds are normal. She exhibits no distension and no mass. There is no tenderness. There is no rebound and no guarding.  Musculoskeletal: Normal range of motion. She exhibits no edema or tenderness.  Normal gait.  Neurological: She is alert and oriented to person, place, and time. She has normal reflexes.  CN 3-12 intact, normal finger to nose, no pronator drift, sensation and strength intact bilaterally.  Skin: Skin is warm and dry.  Psychiatric: She has a normal mood and affect. Her behavior is normal. Judgment and thought content normal.  Nursing note and vitals reviewed.    ED Treatments / Results  Labs (all labs ordered are listed, but only abnormal results are displayed) Labs Reviewed  CBC - Abnormal; Notable for the following:       Result Value   WBC 10.9 (*)    MCV 102.5 (*)    All other components within normal limits  DIFFERENTIAL - Abnormal; Notable for the following:    Lymphs Abs 5.0 (*)    All other components within normal limits  COMPREHENSIVE METABOLIC PANEL - Abnormal; Notable for the following:    Potassium 3.4 (*)    ALT 11 (*)    All other components within normal limits  I-STAT CHEM 8, ED - Abnormal; Notable for the following:    Potassium 3.4 (*)    BUN 5 (*)    All other components within normal limits  ETHANOL  PROTIME-INR  APTT  URINE RAPID DRUG SCREEN, HOSP PERFORMED  URINALYSIS, ROUTINE W REFLEX MICROSCOPIC (NOT AT [facility])  I-STAT TROPOININ, ED    EKG  EKG Interpretation None       Radiology Ct Head Wo Contrast  Result Date: 07/25/2016 CLINICAL DATA:  CVA headache and slurred speech. EXAM: CT HEAD  WITHOUT CONTRAST TECHNIQUE: Contiguous axial images were obtained from the base of the skull through the vertex without intravenous contrast. COMPARISON:  CT of the head 03/23/2006 FINDINGS: Brain: No evidence of acute infarction, hemorrhage, hydrocephalus, extra-axial collection or mass lesion/mass effect. Vascular: No hyperdense vessel or unexpected calcification. Skull: Normal. Negative for fracture or focal lesion. Sinuses/Orbits: No acute finding. Other: None. IMPRESSION: No acute intracranial abnormality. Electronically Signed   By: Fidela Salisbury M.D.   On: 07/25/2016 18:58  Mr Brain Wo Contrast  Result Date: 07/25/2016 CLINICAL DATA:  Aphasia and facial droop EXAM: MRI HEAD WITHOUT CONTRAST TECHNIQUE: Multiplanar, multiecho pulse sequences of the brain and surrounding structures were obtained without intravenous contrast. COMPARISON:  Head CT 09/2010, 07/25/2016 FINDINGS: Brain: No acute infarct or intraparenchymal hemorrhage. The midline structures are normal. There is multifocal hyperintense T2-weighted signal within the periventricular and deep white matter, most often seen in the setting of chronic microvascular ischemia. No mass lesion or midline shift. No hydrocephalus or extra-axial fluid collection. Vascular: Major intracranial arterial and venous sinus flow voids are preserved. No evidence of chronic microhemorrhage or amyloid angiopathy. Skull and upper cervical spine: The visualized skull base, calvarium, upper cervical spine and extracranial soft tissues are normal. Sinuses/Orbits: No fluid levels or advanced mucosal thickening. No mastoid effusion. Normal orbits. IMPRESSION: 1. No acute intracranial abnormality. 2. Chronic microvascular ischemia. Electronically Signed   By: Ulyses Jarred M.D.   On: 07/25/2016 21:39    Procedures Procedures (including critical care time)  Medications Ordered in ED Medications - No data to display   Initial Impression / Assessment and Plan / ED  Course  I have reviewed the triage vital signs and the nursing notes.  Pertinent labs & imaging results that were available during my care of the patient were reviewed by me and considered in my medical decision making (see chart for details).  Clinical Course    Patient with reported a fascia, right-sided facial numbness, and facial droop on Wednesday. Symptoms have improved since then. I do not appreciate any neurological deficits on my exam. CT of the head is negative. Patient is noted to be hypertensive to 152/106. Patient seen by and discussed with Dr. Leonette Monarch, who recommends consultation with neurology.  7:58 PM Appreciate telephone consultation with Dr. Nicole Kindred of neurology, who recommends MRI. If MRI is normal, he states that it would be extremely unlikely for the patient to have had a stroke or TIA given that the symptoms started on Wednesday. If negative, patient can be discharged home with primary care follow-up.   MRI is negative.  DC to home with PCP follow-up per above.  Final Clinical Impressions(s) / ED Diagnoses   Final diagnoses:  Dysarthria    New Prescriptions New Prescriptions   No medications on file     Montine Circle, PA-C 07/25/16 Kimberly, MD 07/26/16 0210

## 2016-07-25 NOTE — Progress Notes (Signed)
Patient listed as having Medicare insurance without a pcp.  EDCM spoke to patient at bedside.  Patient confirms she does not have a pcp.  EDCM provided patient with list of providers who accept Medicare insurance within a 20 mile radius of her zip code.  Patient thankful for resources.  No further EDCM needs at this time.

## 2016-07-25 NOTE — ED Notes (Signed)
Made 1st request for urine sample,pt states she is unable to provide one at this time.

## 2016-09-17 ENCOUNTER — Other Ambulatory Visit: Payer: Self-pay | Admitting: Gastroenterology

## 2016-11-07 ENCOUNTER — Emergency Department (HOSPITAL_COMMUNITY)
Admission: EM | Admit: 2016-11-07 | Discharge: 2016-11-08 | Disposition: A | Discharge status: Home / Self Care | Payer: Medicare Other | Attending: Emergency Medicine | Admitting: Emergency Medicine

## 2016-11-07 ENCOUNTER — Encounter (HOSPITAL_COMMUNITY): Payer: Self-pay | Admitting: Emergency Medicine

## 2016-11-07 ENCOUNTER — Emergency Department (HOSPITAL_COMMUNITY): Payer: Medicare Other

## 2016-11-07 DIAGNOSIS — Y929 Unspecified place or not applicable: Secondary | ICD-10-CM | POA: Diagnosis not present

## 2016-11-07 DIAGNOSIS — M25512 Pain in left shoulder: Secondary | ICD-10-CM | POA: Diagnosis not present

## 2016-11-07 DIAGNOSIS — Y939 Activity, unspecified: Secondary | ICD-10-CM | POA: Insufficient documentation

## 2016-11-07 DIAGNOSIS — I10 Essential (primary) hypertension: Secondary | ICD-10-CM | POA: Diagnosis not present

## 2016-11-07 DIAGNOSIS — Z7982 Long term (current) use of aspirin: Secondary | ICD-10-CM | POA: Insufficient documentation

## 2016-11-07 DIAGNOSIS — Y999 Unspecified external cause status: Secondary | ICD-10-CM | POA: Insufficient documentation

## 2016-11-07 DIAGNOSIS — W01198A Fall on same level from slipping, tripping and stumbling with subsequent striking against other object, initial encounter: Secondary | ICD-10-CM | POA: Diagnosis not present

## 2016-11-07 DIAGNOSIS — S4992XA Unspecified injury of left shoulder and upper arm, initial encounter: Secondary | ICD-10-CM | POA: Diagnosis not present

## 2016-11-07 DIAGNOSIS — R51 Headache: Secondary | ICD-10-CM | POA: Diagnosis not present

## 2016-11-07 DIAGNOSIS — R42 Dizziness and giddiness: Secondary | ICD-10-CM | POA: Diagnosis not present

## 2016-11-07 DIAGNOSIS — Z87891 Personal history of nicotine dependence: Secondary | ICD-10-CM | POA: Diagnosis not present

## 2016-11-07 DIAGNOSIS — W19XXXA Unspecified fall, initial encounter: Secondary | ICD-10-CM

## 2016-11-07 DIAGNOSIS — S199XXA Unspecified injury of neck, initial encounter: Secondary | ICD-10-CM | POA: Diagnosis not present

## 2016-11-07 DIAGNOSIS — S060X0A Concussion without loss of consciousness, initial encounter: Secondary | ICD-10-CM | POA: Insufficient documentation

## 2016-11-07 DIAGNOSIS — S0990XA Unspecified injury of head, initial encounter: Secondary | ICD-10-CM | POA: Diagnosis present

## 2016-11-07 MED ORDER — IBUPROFEN 400 MG PO TABS
ORAL_TABLET | ORAL | Status: AC
  Filled 2016-11-07: qty 1

## 2016-11-07 MED ORDER — IBUPROFEN 400 MG PO TABS
400.0000 mg | ORAL_TABLET | Freq: Once | ORAL | Status: AC
  Administered 2016-11-07: 400 mg via ORAL

## 2016-11-07 NOTE — ED Triage Notes (Signed)
Pt presents to ED for assessment after a fall yesterday where she hit her head on he concrete.  Some bruising noted over pt's left eye.  Pt denies any blood thinners.  Pt also c/o left shoulder pain, neck pain, and headaches since the fall.  Pt states trip and fall, denies LOC.

## 2016-11-08 MED ORDER — HYDROCODONE-ACETAMINOPHEN 5-325 MG PO TABS: 1.0000 | ORAL_TABLET | Freq: Four times a day (QID) | ORAL | 0 refills | Status: DC | PRN

## 2016-11-08 MED ORDER — METHOCARBAMOL 500 MG PO TABS
500.0000 mg | ORAL_TABLET | Freq: Once | ORAL | Status: AC
  Administered 2016-11-08: 500 mg via ORAL
  Filled 2016-11-08: qty 1

## 2016-11-08 MED ORDER — METHOCARBAMOL 500 MG PO TABS: 500.0000 mg | ORAL_TABLET | Freq: Two times a day (BID) | ORAL | 0 refills | Status: DC

## 2016-11-08 MED ORDER — HYDROCODONE-ACETAMINOPHEN 5-325 MG PO TABS
1.0000 | ORAL_TABLET | Freq: Once | ORAL | Status: AC
  Administered 2016-11-08: 1 via ORAL
  Filled 2016-11-08: qty 1

## 2016-11-08 NOTE — ED Provider Notes (Signed)
[facility]PT Provider Note   CSN: [id number] Arrival date & time: 11/07/16  1831     History   Chief Complaint Chief Complaint  Patient presents with  . Fall  . Head Injury  . Shoulder Pain  . Neck Pain    HPI Darlene Griffith is a 66 y.o. female with a hx of HTN presents to the Emergency Department complaining of gradual, persistent, progressively worsening Generalized headache onset yesterday after slip and fall. Patient reports that she struck the left side of her head. She did not have a loss of consciousness. Associated symptoms include headache without vision changes, nausea or vomiting. She also has left-sided shoulder pain and left knee pain. She denies decreased mobility of the shoulder or knee. Patient has used Biofreeze on her neck and shoulder with some relief. No specific aggravating factors. She denies numbness, tingling, weakness, syncope, dysuria, hematuria, cough, fevers, chills, loss of bowel or bladder control, gait disturbance.    The history is provided by the patient and medical records. No language interpreter was used.    Past Medical History:  Diagnosis Date  . Chest pain    a. 01/2015 Echo: Nl LV fxn, Gr 1 DD, triv AI, mild MR.  . Essential hypertension   . Hepatic cyst    a. noted on CT 01/2015.  . Multinodular goiter    a. 01/2015 CT chest: multinodular goidter w/ substernal extension of the left lobe of the thyroid assoc w/ rightward deviation of tracheal air column.  . Pulmonary nodules    a. 01/2015 CT Chest: RLL ~ 1mm subpleural nodule - rec f/u in 6-12 mos.  [name] Splenic cyst    a. noted on CT 01/2015.    Patient Active Problem List   Diagnosis Date Noted  . Midsternal chest pain 02/18/2015  . Bronchitis   . Chest pain   . Cough   . Essential hypertension   . Shortness of breath   . Aortic stenosis   . Pain in the chest   . EKG abnormality   . Malaise and fatigue   . Lung nodule   . Splenic cyst   . Hepatic cyst   . Hyperlipidemia    . Hypertension 02/16/2015    Past Surgical History:  Procedure Laterality Date  . CESAREAN SECTION      OB History    No data available       Home Medications    Prior to Admission medications   Medication Sig Start Date End Date Taking? Authorizing Provider  aspirin EC 81 MG EC tablet Take 1 tablet (81 mg total) by mouth daily. 02/18/15   Rogelia Mire, NP  atorvastatin (LIPITOR) 40 MG tablet Take 1 tablet (40 mg total) by mouth daily at 6 PM. Patient not taking: Reported on 02/27/2015 02/18/15   Rogelia Mire, NP  azelastine (ASTELIN) 0.1 % nasal spray Place 2 sprays into both nostrils 2 (two) times daily. Use in each nostril as directed Patient not taking: Reported on 07/25/2016 02/18/15   Rogelia Mire, NP  HYDROcodone-acetaminophen (NORCO/VICODIN) 5-325 MG tablet Take 1 tablet by mouth every 6 (six) hours as needed for moderate pain or severe pain. 11/08/16   Shiasia Porro, PA-C  ibuprofen (ADVIL,MOTRIN) 600 MG tablet Take 600 mg by mouth every 6 (six) hours as needed for headache.    Historical Provider, MD  lisinopril (PRINIVIL,ZESTRIL) 20 MG tablet Take 1 tablet (20 mg total) by mouth daily. Patient not taking: Reported on 07/25/2016  02/18/15   Rogelia Mire, NP  methocarbamol (ROBAXIN) 500 MG tablet Take 1 tablet (500 mg total) by mouth 2 (two) times daily. 11/08/16   Aimar Shrewsbury, PA-C  nitroGLYCERIN (NITROSTAT) 0.4 MG SL tablet Place 1 tablet (0.4 mg total) under the tongue every 5 (five) minutes x 3 doses as needed for chest pain. Patient not taking: Reported on 02/27/2015 02/18/15   Rogelia Mire, NP    Family History Family History  Problem Relation Age of Onset  . Heart attack Maternal Grandmother     deceased  . High blood pressure Mother   . High Cholesterol Mother     Social History Social History  Substance Use Topics  . Smoking status: Former Research scientist (life sciences)  . Smokeless tobacco: Never Used  . Alcohol use No     Allergies     Patient has no known allergies.   Review of Systems Review of Systems  Musculoskeletal: Positive for arthralgias and neck pain.  Neurological: Positive for headaches.  All other systems reviewed and are negative.    Physical Exam Updated Vital Signs BP 159/78   Pulse 83   Temp 98.4 F (36.9 C) (Oral)   Resp 17   SpO2 97%   Physical Exam  Constitutional: She is oriented to person, place, and time. She appears well-developed and well-nourished. No distress.  HENT:  Head: Normocephalic and atraumatic.  Mouth/Throat: Oropharynx is clear and moist.  Eyes: Conjunctivae and EOM are normal. Pupils are equal, round, and reactive to light. No scleral icterus.  No horizontal, vertical or rotational nystagmus  Neck: Normal range of motion. Neck supple.  Full active and passive ROM without pain No midline or paraspinal tenderness No nuchal rigidity or meningeal signs  Cardiovascular: Normal rate, regular rhythm and intact distal pulses.   Pulmonary/Chest: Effort normal and breath sounds normal. No respiratory distress. She has no wheezes. She has no rales.  Abdominal: Soft. Bowel sounds are normal. There is no tenderness. There is no rebound and no guarding.  Musculoskeletal: Normal range of motion.       Left shoulder: She exhibits pain. She exhibits normal range of motion, no tenderness, no swelling, no effusion, no deformity and normal strength.       Left knee: She exhibits normal range of motion, no swelling, no ecchymosis and no deformity. No tenderness found.  Lymphadenopathy:    She has no cervical adenopathy.  Neurological: She is alert and oriented to person, place, and time. No cranial nerve deficit. She exhibits normal muscle tone. Coordination normal.  Mental Status:  Alert, oriented, thought content appropriate. Speech fluent without evidence of aphasia. Able to follow 2 step commands without difficulty.  Cranial Nerves:  II:  Peripheral visual fields grossly normal,  pupils equal, round, reactive to light III,IV, VI: ptosis not present, extra-ocular motions intact bilaterally  V,VII: smile symmetric, facial light touch sensation equal VIII: hearing grossly normal bilaterally  IX,X: midline uvula rise  XI: bilateral shoulder shrug equal and strong XII: midline tongue extension  Motor:  5/5 in upper and lower extremities bilaterally including strong and equal grip strength and dorsiflexion/plantar flexion Sensory: Pinprick and light touch normal in all extremities.  Cerebellar: normal finger-to-nose with bilateral upper extremities Gait: normal gait and balance CV: distal pulses palpable throughout   Skin: Skin is warm and dry. No rash noted. She is not diaphoretic.  Psychiatric: She has a normal mood and affect. Her behavior is normal. Judgment and thought content normal.  Nursing note and  vitals reviewed.    ED Treatments / Results   Radiology Ct Head Wo Contrast  Result Date: 11/07/2016 CLINICAL DATA:  Fall yesterday left-sided head pain and dizziness weakness EXAM: CT HEAD WITHOUT CONTRAST CT CERVICAL SPINE WITHOUT CONTRAST TECHNIQUE: Multidetector CT imaging of the head and cervical spine was performed following the standard protocol without intravenous contrast. Multiplanar CT image reconstructions of the cervical spine were also generated. COMPARISON:  MRI and 07/25/2016, CT head 07/25/2016 CT neck 09/29/2008 FINDINGS: CT HEAD FINDINGS Brain: No acute territorial infarction, intracranial hemorrhage or extra-axial fluid collection is seen. There is no focal mass, mass effect or midline shift. Mild to moderate periventricular white matter hypodensity consistent with small vessel change. Ventricles nonenlarged. Vascular: No hyperdense vessels. Scattered calcifications at the carotid siphons. Skull: Mastoid air cells are clear.  No skull fracture. Sinuses/Orbits: Mild mucosal thickening in the ethmoid sinuses. No acute orbital abnormality. Other: None CT  CERVICAL SPINE FINDINGS Alignment: Straightening of the cervical spine. No subluxation. Facet alignment is maintained. Skull base and vertebrae: Craniovertebral junction is intact. Vertebral body heights are normal. No fracture. Soft tissues and spinal canal: No prevertebral fluid or swelling. No visible canal hematoma. Disc levels: Moderate disc changes are present at C5-C6, mild changes are present C4-C5 and C6-C7. Mild right foraminal narrowing at C5-C6 and moderate left foraminal narrowing at C5-C6. Upper chest: Lung apices clear. Again visualized is a markedly enlarged thyroid gland with extension of the left lobe into the superior mediastinum. This causes tracheal deviation to the right. Other: None IMPRESSION: 1. No CT evidence for acute intracranial abnormality. 2. Straightening of the cervical spine. No fracture or malalignment. 3. Marked thyromegaly with extension of left lobe into the superior mediastinum. This was noted on prior CT from 2009. Electronically Signed   By: Donavan Foil M.D.   On: 11/07/2016 21:24   Ct Cervical Spine Wo Contrast  Result Date: 11/07/2016 CLINICAL DATA:  Fall yesterday left-sided head pain and dizziness weakness EXAM: CT HEAD WITHOUT CONTRAST CT CERVICAL SPINE WITHOUT CONTRAST TECHNIQUE: Multidetector CT imaging of the head and cervical spine was performed following the standard protocol without intravenous contrast. Multiplanar CT image reconstructions of the cervical spine were also generated. COMPARISON:  MRI and 07/25/2016, CT head 07/25/2016 CT neck 09/29/2008 FINDINGS: CT HEAD FINDINGS Brain: No acute territorial infarction, intracranial hemorrhage or extra-axial fluid collection is seen. There is no focal mass, mass effect or midline shift. Mild to moderate periventricular white matter hypodensity consistent with small vessel change. Ventricles nonenlarged. Vascular: No hyperdense vessels. Scattered calcifications at the carotid siphons. Skull: Mastoid air cells  are clear.  No skull fracture. Sinuses/Orbits: Mild mucosal thickening in the ethmoid sinuses. No acute orbital abnormality. Other: None CT CERVICAL SPINE FINDINGS Alignment: Straightening of the cervical spine. No subluxation. Facet alignment is maintained. Skull base and vertebrae: Craniovertebral junction is intact. Vertebral body heights are normal. No fracture. Soft tissues and spinal canal: No prevertebral fluid or swelling. No visible canal hematoma. Disc levels: Moderate disc changes are present at C5-C6, mild changes are present C4-C5 and C6-C7. Mild right foraminal narrowing at C5-C6 and moderate left foraminal narrowing at C5-C6. Upper chest: Lung apices clear. Again visualized is a markedly enlarged thyroid gland with extension of the left lobe into the superior mediastinum. This causes tracheal deviation to the right. Other: None IMPRESSION: 1. No CT evidence for acute intracranial abnormality. 2. Straightening of the cervical spine. No fracture or malalignment. 3. Marked thyromegaly with extension of left lobe into  the superior mediastinum. This was noted on prior CT from 2009. Electronically Signed   By: Donavan Foil M.D.   On: 11/07/2016 21:24   Dg Shoulder Left  Result Date: 11/07/2016 CLINICAL DATA:  Left shoulder pain after a fall yesterday. Initial encounter. EXAM: LEFT SHOULDER - 2+ VIEW COMPARISON:  None. FINDINGS: There is no evidence of acute fracture or dislocation. Mild-to-moderate spurring is noted at the inferior aspect of the glenohumeral joint as well as at the acromioclavicular joint. No soft tissue abnormality is seen. IMPRESSION: 1. No evidence of acute osseous abnormality. 2. Acromioclavicular and glenohumeral osteoarthrosis. Electronically Signed   By: Logan Bores M.D.   On: 11/07/2016 20:17    Procedures Procedures (including critical care time)  Medications Ordered in ED Medications  ibuprofen (ADVIL,MOTRIN) 400 MG tablet (not administered)  ibuprofen  (ADVIL,MOTRIN) tablet 400 mg (400 mg Oral Given 11/07/16 1916)  methocarbamol (ROBAXIN) tablet 500 mg (500 mg Oral Given 11/08/16 0143)  HYDROcodone-acetaminophen (NORCO/VICODIN) 5-325 MG per tablet 1 tablet (1 tablet Oral Given 11/08/16 0143)     Initial Impression / Assessment and Plan / ED Course  I have reviewed the triage vital signs and the nursing notes.  Pertinent labs & imaging results that were available during my care of the patient were reviewed by me and considered in my medical decision making (see chart for details).     Pt Resents with headache after fall. CT scan without evidence of intracranial abnormality. No fractures of the C-spine. Shoulder films without fracture. Full range of motion without difficulty.  Normal neurologic exam including normal ambulation.  Headache is likely secondary to concussion. No syncope. No chest pain or shortness of breath. Patient given pain control and muscle relaxer. She reports feeling much better. Discharged home with muscle relaxers and conservative therapies. Discussed reasons to return to the emergency department including loss of bowel or bladder control, numbness, tingling, weakness or other concerns.  Final Clinical Impressions(s) / ED Diagnoses   Final diagnoses:  Fall, initial encounter  Acute pain of left shoulder  Concussion without loss of consciousness, initial encounter    New Prescriptions New Prescriptions   HYDROCODONE-ACETAMINOPHEN (NORCO/VICODIN) 5-325 MG TABLET    Take 1 tablet by mouth every 6 (six) hours as needed for moderate pain or severe pain.   METHOCARBAMOL (ROBAXIN) 500 MG TABLET    Take 1 tablet (500 mg total) by mouth 2 (two) times daily.     Jarrett Soho Anginette Espejo, PA-C 11/08/16 0222    Everlene Balls, MD 11/08/16 1252

## 2016-11-08 NOTE — ED Notes (Signed)
Pt was found to be located in the pt lobby and not roomed to the assigned bed at the time indicated. Pt being placed in bed now.

## 2016-11-08 NOTE — Discharge Instructions (Signed)
1. Medications: Ibuprofen or Tylenol for pain; Vicodin for severe pain; Robaxin for muscle spasms 2. Treatment: Rest, ice on head.  Concussion precautions given - keep patient in a quiet, not simulating, dark environment. No TV, computer use, video games until headache is resolved completely. No contact sports until cleared by the pediatrician. 3. Follow Up: With primary care physician on Monday if headache persists.  Return to the emergency department if patient becomes lethargic, begins vomiting, develops double vision, speech difficulty, problems walking or other change in mental status.

## 2016-11-13 ENCOUNTER — Encounter: Payer: Self-pay | Admitting: Family Medicine

## 2016-11-13 ENCOUNTER — Ambulatory Visit (INDEPENDENT_AMBULATORY_CARE_PROVIDER_SITE_OTHER): Payer: Medicare Other | Admitting: Family Medicine

## 2016-11-13 VITALS — BP 124/80 | HR 104 | Temp 99.0°F | Ht 61.0 in | Wt 140.4 lb

## 2016-11-13 DIAGNOSIS — R6889 Other general symptoms and signs: Secondary | ICD-10-CM | POA: Diagnosis not present

## 2016-11-13 DIAGNOSIS — I10 Essential (primary) hypertension: Secondary | ICD-10-CM | POA: Diagnosis not present

## 2016-11-13 MED ORDER — LISINOPRIL 20 MG PO TABS: 20.0000 mg | ORAL_TABLET | Freq: Every day | ORAL | 0 refills | Status: DC

## 2016-11-13 MED ORDER — OSELTAMIVIR PHOSPHATE 75 MG PO CAPS
75.0000 mg | ORAL_CAPSULE | Freq: Two times a day (BID) | ORAL | 0 refills | Status: DC
Start: 2016-11-13 — End: 2016-11-27

## 2016-11-13 NOTE — Progress Notes (Signed)
Pre visit review using our clinic review tool, if applicable. No additional management support is needed unless otherwise documented below in the visit note. 

## 2016-11-13 NOTE — Patient Instructions (Signed)
Please take medication as directed. Follow up if symptoms do not improve in 3 to 4 days, worsen, or you develop a fever >100.   Recommend that you monitor your blood pressure daily for one week, document readings, and follow up in one week with these readings and your cuff for evaluation.  It was a pleasure seeing you today. Recommend close follow up for an established care visit and routine care.    Influenza, Adult Influenza ("the flu") is an infection in the lungs, nose, and throat (respiratory tract). It is caused by a virus. The flu causes many common cold symptoms, as well as a high fever and body aches. It can make you feel very sick. The flu spreads easily from person to person (is contagious). Getting a flu shot (influenza vaccination) every year is the best way to prevent the flu. Follow these instructions at home:  Take over-the-counter and prescription medicines only as told by your doctor.  Use a cool mist humidifier to add moisture (humidity) to the air in your home. This can make it easier to breathe.  Rest as needed.  Drink enough fluid to keep your pee (urine) clear or pale yellow.  Cover your mouth and nose when you cough or sneeze.  Wash your hands with soap and water often, especially after you cough or sneeze. If you cannot use soap and water, use hand sanitizer.  Stay home from work or school as told by your doctor. Unless you are visiting your doctor, try to avoid leaving home until your fever has been gone for 24 hours without the use of medicine.  Keep all follow-up visits as told by your doctor. This is important. How is this prevented?  Getting a yearly (annual) flu shot is the best way to avoid getting the flu. You may get the flu shot in late summer, fall, or winter. Ask your doctor when you should get your flu shot.  Wash your hands often or use hand sanitizer often.  Avoid contact with people who are sick during cold and flu season.  Eat healthy  foods.  Drink plenty of fluids.  Get enough sleep.  Exercise regularly. Contact a doctor if:  You get new symptoms.  You have:  Chest pain.  Watery poop (diarrhea).  A fever.  Your cough gets worse.  You start to have more mucus.  You feel sick to your stomach (nauseous).  You throw up (vomit). Get help right away if:  You start to be short of breath or have trouble breathing.  Your skin or nails turn a bluish color.  You have very bad pain or stiffness in your neck.  You get a sudden headache.  You get sudden pain in your face or ear.  You cannot stop throwing up. This information is not intended to replace advice given to you by your health care provider. Make sure you discuss any questions you have with your health care provider. Document Released: 07/15/2008 Document Revised: 03/13/2016 Document Reviewed: 07/31/2015 Elsevier Interactive Patient Education  2017 [employer].

## 2016-11-13 NOTE — Progress Notes (Signed)
Subjective:    Patient ID: Darlene Griffith, female    DOB: [birth year], 66 y.o.   MRN: [id number]  HPI  [name] is a 66 year old female who presents today with a sore throat that has been present for one day.  She reports a generalized headache without vision changes that is intermittent.  She was seen in the ED 11/07/16 following a fall and report of a HA.  She was evaluated by CT which was negative for intracranial abnormality. Neuro exam was normal in the ED also.     Associated nasal congestion, sinus pressure/pain, rhinitis with clear, chills, sweats, nausea, nonproductive cough, and myalgias that started one day ago. She denies vomiting, dizziness, visual changes, SOB, chest pain, palpitations, numbness, tingling, and weakness. Treatment at home includes coricidin that has provided limited benefit. No aggravating or alleviating factors noted. No influenza vaccine this year. Recent sick contact exposure with 66 year old and 34 month old grandchildren. No recent antibiotic therapy. No history of asthma or bronchitis.  HTN: Prior treatment with lisinopril however she has not been adherent to this medication but has recently resumed this a few days ago.  She does not monitor her BP at home.  She does not follow a particular diet and she reports eating salty food such as pork often. She has seen a cardiologist in 2016 but has not followed up for regular care.  She plans to seek care with her cardiologist this year for follow up as has been recommended.   Review of Systems  Constitutional: Positive for chills. Negative for fever.  HENT: Positive for congestion, rhinorrhea, sinus pain, sinus pressure and sore throat.   Respiratory: Positive for cough. Negative for shortness of breath and wheezing.   Cardiovascular: Negative for chest pain and palpitations.  Gastrointestinal: Positive for nausea. Negative for diarrhea and vomiting.  Genitourinary: Negative for dysuria, frequency and urgency.    Musculoskeletal: Positive for myalgias.  Skin: Negative for rash.  Neurological: Positive for headaches. Negative for dizziness and numbness.  Psychiatric/Behavioral:       Denies depressed or anxious mood   Past Medical History:  Diagnosis Date  . Chest pain    a. 01/2015 Echo: Nl LV fxn, Gr 1 DD, triv AI, mild MR.  . Essential hypertension   . Hepatic cyst    a. noted on CT 01/2015.  . Multinodular goiter    a. 01/2015 CT chest: multinodular goidter w/ substernal extension of the left lobe of the thyroid assoc w/ rightward deviation of tracheal air column.  . Pulmonary nodules    a. 01/2015 CT Chest: RLL ~ 80mm subpleural nodule - rec f/u in 6-12 mos.  [name] Splenic cyst    a. noted on CT 01/2015.     Social History   Social History  . Marital status: Single    Spouse name: N/A  . Number of children: N/A  . Years of education: N/A   Occupational History  . Not on file.   Social History Main Topics  . Smoking status: Former Research scientist (life sciences)  . Smokeless tobacco: Never Used  . Alcohol use No  . Drug use: No  . Sexual activity: Not on file   Other Topics Concern  . Not on file   Social History Narrative  . No narrative on file    Past Surgical History:  Procedure Laterality Date  . CESAREAN SECTION      Family History  Problem Relation Age of Onset  . Heart  attack Maternal Grandmother     deceased  . High blood pressure Mother   . High Cholesterol Mother     No Known Allergies  Current Outpatient Prescriptions on File Prior to Visit  Medication Sig Dispense Refill  . HYDROcodone-acetaminophen (NORCO/VICODIN) 5-325 MG tablet Take 1 tablet by mouth every 6 (six) hours as needed for moderate pain or severe pain. 5 tablet 0  . ibuprofen (ADVIL,MOTRIN) 600 MG tablet Take 600 mg by mouth every 6 (six) hours as needed for headache.    [name] aspirin EC 81 MG EC tablet Take 1 tablet (81 mg total) by mouth daily. (Patient not taking: Reported on 11/13/2016)    . atorvastatin (LIPITOR)  40 MG tablet Take 1 tablet (40 mg total) by mouth daily at 6 PM. (Patient not taking: Reported on 11/13/2016) 30 tablet 6  . azelastine (ASTELIN) 0.1 % nasal spray Place 2 sprays into both nostrils 2 (two) times daily. Use in each nostril as directed (Patient not taking: Reported on 11/13/2016) 30 mL 6  . lisinopril (PRINIVIL,ZESTRIL) 20 MG tablet Take 1 tablet (20 mg total) by mouth daily. (Patient not taking: Reported on 11/13/2016) 30 tablet 6  . methocarbamol (ROBAXIN) 500 MG tablet Take 1 tablet (500 mg total) by mouth 2 (two) times daily. (Patient not taking: Reported on 11/13/2016) 20 tablet 0  . nitroGLYCERIN (NITROSTAT) 0.4 MG SL tablet Place 1 tablet (0.4 mg total) under the tongue every 5 (five) minutes x 3 doses as needed for chest pain. (Patient not taking: Reported on 11/13/2016) 25 tablet 3   No current facility-administered medications on file prior to visit.     BP 124/80 (BP Location: Left Arm, Patient Position: Sitting, Cuff Size: Normal)   Pulse (!) 104   Temp 99 F (37.2 C) (Oral)   Ht 5\' 1"  (1.549 m)   Wt 140 lb 6.4 oz (63.7 kg)   SpO2 94%   BMI 26.53 kg/m         Objective:   Physical Exam  Constitutional: She is oriented to person, place, and time. She appears well-developed and well-nourished.  Lying on exam table, appearing acutely ill  HENT:  Right Ear: Tympanic membrane normal.  Left Ear: Tympanic membrane normal.  Nose: No rhinorrhea. Right sinus exhibits maxillary sinus tenderness and frontal sinus tenderness. Left sinus exhibits maxillary sinus tenderness and frontal sinus tenderness.  Mouth/Throat: Mucous membranes are normal. No oropharyngeal exudate or posterior oropharyngeal erythema.  Eyes: Pupils are equal, round, and reactive to light. No scleral icterus.  Neck: Neck supple.  Cardiovascular: Normal rate and regular rhythm.   Pulmonary/Chest: Effort normal and breath sounds normal. She has no wheezes. She has no rales.  Abdominal: Soft. Bowel sounds  are normal. There is no tenderness.  Musculoskeletal: She exhibits no edema.  Lymphadenopathy:    She has no cervical adenopathy.  Neurological: She is alert and oriented to person, place, and time. She has normal strength. No sensory deficit.  Skin: Skin is warm and dry. No rash noted.  Psychiatric: She has a normal mood and affect. Her behavior is normal. Judgment and thought content normal.       Assessment & Plan:  1. Essential hypertension Controlled; continue lisinopril; monitor BP at home once daily and follow up in one week with readings and BP cuff in one week for further evaluation.  - lisinopril (PRINIVIL,ZESTRIL) 20 MG tablet; Take 1 tablet (20 mg total) by mouth daily.  Dispense: 30 tablet; Refill: 0  2. Flu-like  symptoms POC influenza negative; Suspect this is a false negative; Symptoms appear to be flu like in nature; exam reassuring; will treat with tamiflu, supportive measures, and provided written return precautions.  - oseltamivir (TAMIFLU) 75 MG capsule; Take 1 capsule (75 mg total) by mouth 2 (two) times daily.  Dispense: 10 capsule; Refill: 0  Recommend close follow up and established care visit to address routine health management concerns as she has not had a PCP in over 2 years per patient.  Delano Metz, FNP-C

## 2016-11-20 ENCOUNTER — Ambulatory Visit: Payer: Medicare Other | Admitting: Family Medicine

## 2016-11-27 ENCOUNTER — Encounter: Payer: Self-pay | Admitting: Family Medicine

## 2016-11-27 ENCOUNTER — Ambulatory Visit (INDEPENDENT_AMBULATORY_CARE_PROVIDER_SITE_OTHER): Payer: Medicare Other | Admitting: Family Medicine

## 2016-11-27 VITALS — BP 152/88 | HR 84 | Temp 98.6°F | Ht 61.0 in | Wt 142.0 lb

## 2016-11-27 DIAGNOSIS — J029 Acute pharyngitis, unspecified: Secondary | ICD-10-CM | POA: Diagnosis not present

## 2016-11-27 DIAGNOSIS — I10 Essential (primary) hypertension: Secondary | ICD-10-CM | POA: Diagnosis not present

## 2016-11-27 LAB — POCT RAPID STREP A (OFFICE): Rapid Strep A Screen: NEGATIVE

## 2016-11-27 NOTE — Progress Notes (Signed)
Pre visit review using our clinic review tool, if applicable. No additional management support is needed unless otherwise documented below in the visit note. 

## 2016-11-27 NOTE — Patient Instructions (Signed)
Please monitor blood pressure and follow up in 2 to 4 weeks for a physical and lab work. Also, please contact your cardiologist for a follow up visit that was recommended for you.  Your symptoms are most likely related to a viral illness. Please drink plenty of water so that your urine is pale yellow or clear. Also, get plenty of rest, use tylenol as needed for discomfort and follow up if symptoms do not improve in 3 to 4 days, worsen, or you develop a fever >101.   Please consider DASH diet recommendations to help with lowering blood pressure.    DASH Eating Plan DASH stands for "Dietary Approaches to Stop Hypertension." The DASH eating plan is a healthy eating plan that has been shown to reduce high blood pressure (hypertension). Additional health benefits may include reducing the risk of type 2 diabetes mellitus, heart disease, and stroke. The DASH eating plan may also help with weight loss. What do I need to know about the DASH eating plan? For the DASH eating plan, you will follow these general guidelines:  Choose foods with less than 150 milligrams of sodium per serving (as listed on the food label).  Use salt-free seasonings or herbs instead of table salt or sea salt.  Check with your health care provider or pharmacist before using salt substitutes.  Eat lower-sodium products. These are often labeled as "low-sodium" or "no salt added."  Eat fresh foods. Avoid eating a lot of canned foods.  Eat more vegetables, fruits, and low-fat dairy products.  Choose whole grains. Look for the word "whole" as the first word in the ingredient list.  Choose fish and skinless chicken or [place] more often than red meat. Limit fish, poultry, and meat to 6 oz (170 g) each day.  Limit sweets, desserts, sugars, and sugary drinks.  Choose heart-healthy fats.  Eat more home-cooked food and less restaurant, buffet, and fast food.  Limit fried foods.  Do not fry foods. Cook foods using methods such  as baking, boiling, grilling, and broiling instead.  When eating at a restaurant, ask that your food be prepared with less salt, or no salt if possible. What foods can I eat? Seek help from a dietitian for individual calorie needs. Grains  Whole grain or whole wheat bread. Brown rice. Whole grain or whole wheat pasta. Quinoa, bulgur, and whole grain cereals. Low-sodium cereals. Corn or whole wheat flour tortillas. Whole grain cornbread. Whole grain crackers. Low-sodium crackers. Vegetables  Fresh or frozen vegetables (raw, steamed, roasted, or grilled). Low-sodium or reduced-sodium tomato and vegetable juices. Low-sodium or reduced-sodium tomato sauce and paste. Low-sodium or reduced-sodium canned vegetables. Fruits  All fresh, canned (in natural juice), or frozen fruits. Meat and Other Protein Products  Ground beef (85% or leaner), grass-fed beef, or beef trimmed of fat. Skinless chicken or [place]. Ground chicken or [place]. Pork trimmed of fat. All fish and seafood. Eggs. Dried beans, peas, or lentils. Unsalted nuts and seeds. Unsalted canned beans. Dairy  Low-fat dairy products, such as skim or 1% milk, 2% or reduced-fat cheeses, low-fat ricotta or cottage cheese, or plain low-fat yogurt. Low-sodium or reduced-sodium cheeses. Fats and Oils  Tub margarines without trans fats. Light or reduced-fat mayonnaise and salad dressings (reduced sodium). Avocado. Safflower, olive, or canola oils. Natural peanut or almond butter. Other  Unsalted popcorn and pretzels. The items listed above may not be a complete list of recommended foods or beverages. Contact your dietitian for more options.  What foods are not recommended?  Grains  White bread. White pasta. White rice. Refined cornbread. Bagels and croissants. Crackers that contain trans fat. Vegetables  Creamed or fried vegetables. Vegetables in a cheese sauce. Regular canned vegetables. Regular canned tomato sauce and paste. Regular tomato and  vegetable juices. Fruits  Canned fruit in light or heavy syrup. Fruit juice. Meat and Other Protein Products  Fatty cuts of meat. Ribs, chicken wings, bacon, sausage, bologna, salami, chitterlings, fatback, hot dogs, bratwurst, and packaged luncheon meats. Salted nuts and seeds. Canned beans with salt. Dairy  Whole or 2% milk, cream, half-and-half, and cream cheese. Whole-fat or sweetened yogurt. Full-fat cheeses or blue cheese. Nondairy creamers and whipped toppings. Processed cheese, cheese spreads, or cheese curds. Condiments  Onion and garlic salt, seasoned salt, table salt, and sea salt. Canned and packaged gravies. Worcestershire sauce. Tartar sauce. Barbecue sauce. Teriyaki sauce. Soy sauce, including reduced sodium. Steak sauce. Fish sauce. Oyster sauce. Cocktail sauce. Horseradish. Ketchup and mustard. Meat flavorings and tenderizers. Bouillon cubes. Hot sauce. Tabasco sauce. Marinades. Taco seasonings. Relishes. Fats and Oils  Butter, stick margarine, lard, shortening, ghee, and bacon fat. Coconut, palm kernel, or palm oils. Regular salad dressings. Other  Pickles and olives. Salted popcorn and pretzels. The items listed above may not be a complete list of foods and beverages to avoid. Contact your dietitian for more information.  Where can I find more information? National Heart, Lung, and Blood Institute: travelstabloid.com This information is not intended to replace advice given to you by your health care provider. Make sure you discuss any questions you have with your health care provider. Document Released: 09/25/2011 Document Revised: 03/13/2016 Document Reviewed: 08/10/2013 Elsevier Interactive Patient Education  2017 [employer].

## 2016-11-27 NOTE — Progress Notes (Signed)
Subjective:    Patient ID: Darlene Griffith, female    DOB: Apr 08, 1951, 66 y.o.   MRN: [id number]  HPI  Darlene Griffith is a 66 year old female who presents today for follow up after resuming treatment for HTN.  She reports purchasing a BP cuff only a few days ago and she has not monitored her BP.  She reports that she will start monitoring and documenting the readings.  She denies chest pain, palpitations, SOB, numbness, tingling, weakness, headaches, and nosebleeds. She is not monitoring her diet and has a history of nonadherence with her medication. She also plans to schedule an appointment with her cardiologist that was recommended previously in 2016   Today she reports that she has a sore throat for four days. Associated post nasal drip and rhinitis with clear drainage.  She denies sinus pressure/pain, congestion, cough, myalgias, fever, chills, N/V/D, ear pain,and sweats. Treatment at home with coricidin has provided limited benefit. Flu-like symptoms have improved per patient.  Review of Systems  Constitutional: Negative for chills and fever.  HENT: Positive for postnasal drip, rhinorrhea and sore throat. Negative for congestion, sinus pressure and sneezing.   Respiratory: Negative for cough, shortness of breath and wheezing.   Cardiovascular: Negative for chest pain and palpitations.  Gastrointestinal: Negative for abdominal pain, diarrhea, nausea and vomiting.  Musculoskeletal: Negative for myalgias.  Skin: Negative for rash.  Neurological: Negative for dizziness and headaches.  Psychiatric/Behavioral:       Denies depressed or anxious mood   Past Medical History:  Diagnosis Date  . Chest pain    a. 01/2015 Echo: Nl LV fxn, Gr 1 DD, triv AI, mild MR.  . Essential hypertension   . Hepatic cyst    a. noted on CT 01/2015.  . Multinodular goiter    a. 01/2015 CT chest: multinodular goidter w/ substernal extension of the left lobe of the thyroid assoc w/ rightward deviation of tracheal  air column.  . Pulmonary nodules    a. 01/2015 CT Chest: RLL ~ 46mm subpleural nodule - rec f/u in 6-12 mos.  [name] Splenic cyst    a. noted on CT 01/2015.     Social History   Social History  . Marital status: Single    Spouse name: N/A  . Number of children: N/A  . Years of education: N/A   Occupational History  . Not on file.   Social History Main Topics  . Smoking status: Former Research scientist (life sciences)  . Smokeless tobacco: Never Used  . Alcohol use No  . Drug use: No  . Sexual activity: Not on file   Other Topics Concern  . Not on file   Social History Narrative  . No narrative on file    Past Surgical History:  Procedure Laterality Date  . CESAREAN SECTION      Family History  Problem Relation Age of Onset  . Heart attack Maternal Grandmother     deceased  . High blood pressure Mother   . High Cholesterol Mother     No Known Allergies  Current Outpatient Prescriptions on File Prior to Visit  Medication Sig Dispense Refill  . aspirin EC 81 MG EC tablet Take 1 tablet (81 mg total) by mouth daily.    [name] atorvastatin (LIPITOR) 40 MG tablet Take 1 tablet (40 mg total) by mouth daily at 6 PM. 30 tablet 6  . azelastine (ASTELIN) 0.1 % nasal spray Place 2 sprays into both nostrils 2 (two) times daily. Use in  each nostril as directed 30 mL 6  . HYDROcodone-acetaminophen (NORCO/VICODIN) 5-325 MG tablet Take 1 tablet by mouth every 6 (six) hours as needed for moderate pain or severe pain. 5 tablet 0  . ibuprofen (ADVIL,MOTRIN) 600 MG tablet Take 600 mg by mouth every 6 (six) hours as needed for headache.    . lisinopril (PRINIVIL,ZESTRIL) 20 MG tablet Take 1 tablet (20 mg total) by mouth daily. 30 tablet 0  . methocarbamol (ROBAXIN) 500 MG tablet Take 1 tablet (500 mg total) by mouth 2 (two) times daily. 20 tablet 0  . nitroGLYCERIN (NITROSTAT) 0.4 MG SL tablet Place 1 tablet (0.4 mg total) under the tongue every 5 (five) minutes x 3 doses as needed for chest pain. 25 tablet 3   No  current facility-administered medications on file prior to visit.     BP (!) 152/88   Pulse 84   Temp 98.6 F (37 C)   Ht 5\' 1"  (1.549 m)   Wt 142 lb (64.4 kg)   SpO2 96%   BMI 26.83 kg/m       Objective:   Physical Exam  Constitutional: She is oriented to person, place, and time. She appears well-developed and well-nourished.  Missing upper denture; patient reports her dog chewed the denture  HENT:  Right Ear: Tympanic membrane normal.  Left Ear: Tympanic membrane normal.  Nose: Rhinorrhea present. Right sinus exhibits no maxillary sinus tenderness and no frontal sinus tenderness. Left sinus exhibits no maxillary sinus tenderness and no frontal sinus tenderness.  Mouth/Throat: Mucous membranes are normal. No oropharyngeal exudate or posterior oropharyngeal erythema.  Eyes: Pupils are equal, round, and reactive to light. No scleral icterus.  Neck: Neck supple.  Cardiovascular: Normal rate, regular rhythm and intact distal pulses.   Pulmonary/Chest: Effort normal and breath sounds normal. She has no wheezes. She has no rales.  Abdominal: Soft. Bowel sounds are normal. There is no tenderness.  Lymphadenopathy:    She has no cervical adenopathy.  Neurological: She is alert and oriented to person, place, and time.  Skin: Skin is warm and dry. No rash noted.       Assessment & Plan:  1. Essential hypertension Controlled; Advised monitoring of BP, document readings, and follow up in 2 weeks with readings or sooner if needed. Advised DASH diet and written recommendations provided  2. Sore throat POC rapid strep:  Negative Suspect viral in nature and other symptoms of suspected influenza are resolving.  Advised patient on supportive measures:  Get rest, drink plenty of fluids, and use tylenol as needed for pain. Follow up if fever >101, if symptoms worsen or if symptoms are not improved in 3 to 4 days. Patient verbalizes understanding.   Follow up in 2 to 4 weeks for physical and  lab work. Recommended that she schedule a visit with her cardiologist as previously recommended.   Delano Metz, FNP-C

## 2017-03-23 DIAGNOSIS — I10 Essential (primary) hypertension: Secondary | ICD-10-CM | POA: Diagnosis not present

## 2017-03-23 DIAGNOSIS — M545 Low back pain: Secondary | ICD-10-CM | POA: Diagnosis not present

## 2017-03-23 DIAGNOSIS — S91331A Puncture wound without foreign body, right foot, initial encounter: Secondary | ICD-10-CM | POA: Diagnosis not present

## 2018-05-12 ENCOUNTER — Encounter: Payer: Medicare Other | Admitting: Family Medicine

## 2018-05-27 ENCOUNTER — Encounter

## 2018-05-27 ENCOUNTER — Ambulatory Visit (INDEPENDENT_AMBULATORY_CARE_PROVIDER_SITE_OTHER): Payer: Medicare Other | Admitting: Family Medicine

## 2018-05-27 ENCOUNTER — Encounter: Payer: Self-pay | Admitting: Family Medicine

## 2018-05-27 VITALS — BP 140/78 | HR 98 | Temp 98.6°F | Ht 60.5 in | Wt 137.4 lb

## 2018-05-27 DIAGNOSIS — I10 Essential (primary) hypertension: Secondary | ICD-10-CM | POA: Diagnosis not present

## 2018-05-27 MED ORDER — LISINOPRIL 20 MG PO TABS: 20.0000 mg | ORAL_TABLET | Freq: Every day | ORAL | 2 refills | Status: DC

## 2018-05-27 NOTE — Patient Instructions (Signed)
Managing Your Hypertension Hypertension is commonly called high blood pressure. This is when the force of your blood pressing against the walls of your arteries is too strong. Arteries are blood vessels that carry blood from your heart throughout your body. Hypertension forces the heart to work harder to pump blood, and may cause the arteries to become narrow or stiff. Having untreated or uncontrolled hypertension can cause heart attack, stroke, kidney disease, and other problems. What are blood pressure readings? A blood pressure reading consists of a higher number over a lower number. Ideally, your blood pressure should be below 120/80. The first ("top") number is called the systolic pressure. It is a measure of the pressure in your arteries as your heart beats. The second ("bottom") number is called the diastolic pressure. It is a measure of the pressure in your arteries as the heart relaxes. What does my blood pressure reading mean? Blood pressure is classified into four stages. Based on your blood pressure reading, your health care provider may use the following stages to determine what type of treatment you need, if any. Systolic pressure and diastolic pressure are measured in a unit called mm Hg. Normal  Systolic pressure: below 120.  Diastolic pressure: below 80. Elevated  Systolic pressure: 120-129.  Diastolic pressure: below 80. Hypertension stage 1  Systolic pressure: 130-139.  Diastolic pressure: 80-89. Hypertension stage 2  Systolic pressure: 140 or above.  Diastolic pressure: 90 or above. What health risks are associated with hypertension? Managing your hypertension is an important responsibility. Uncontrolled hypertension can lead to:  A heart attack.  A stroke.  A weakened blood vessel (aneurysm).  Heart failure.  Kidney damage.  Eye damage.  Metabolic syndrome.  Memory and concentration problems.  What changes can I make to manage my  hypertension? Hypertension can be managed by making lifestyle changes and possibly by taking medicines. Your health care provider will help you make a plan to bring your blood pressure within a normal range. Eating and drinking  Eat a diet that is high in fiber and potassium, and low in salt (sodium), added sugar, and fat. An example eating plan is called the DASH (Dietary Approaches to Stop Hypertension) diet. To eat this way: ? Eat plenty of fresh fruits and vegetables. Try to fill half of your plate at each meal with fruits and vegetables. ? Eat whole grains, such as whole wheat pasta, brown rice, or whole grain bread. Fill about one quarter of your plate with whole grains. ? Eat low-fat diary products. ? Avoid fatty cuts of meat, processed or cured meats, and poultry with skin. Fill about one quarter of your plate with lean proteins such as fish, chicken without skin, beans, eggs, and tofu. ? Avoid premade and processed foods. These tend to be higher in sodium, added sugar, and fat.  Reduce your daily sodium intake. Most people with hypertension should eat less than 1,500 mg of sodium a day.  Limit alcohol intake to no more than 1 drink a day for nonpregnant women and 2 drinks a day for men. One drink equals 12 oz of beer, 5 oz of wine, or 1 oz of hard liquor. Lifestyle  Work with your health care provider to maintain a healthy body weight, or to lose weight. Ask what an ideal weight is for you.  Get at least 30 minutes of exercise that causes your heart to beat faster (aerobic exercise) most days of the week. Activities may include walking, swimming, or biking.  Include exercise   to strengthen your muscles (resistance exercise), such as weight lifting, as part of your weekly exercise routine. Try to do these types of exercises for 30 minutes at least 3 days a week.  Do not use any products that contain nicotine or tobacco, such as cigarettes and e-cigarettes. If you need help quitting, ask  your health care provider.  Control any long-term (chronic) conditions you have, such as high cholesterol or diabetes. Monitoring  Monitor your blood pressure at home as told by your health care provider. Your personal target blood pressure may vary depending on your medical conditions, your age, and other factors.  Have your blood pressure checked regularly, as often as told by your health care provider. Working with your health care provider  Review all the medicines you take with your health care provider because there may be side effects or interactions.  Talk with your health care provider about your diet, exercise habits, and other lifestyle factors that may be contributing to hypertension.  Visit your health care provider regularly. Your health care provider can help you create and adjust your plan for managing hypertension. Will I need medicine to control my blood pressure? Your health care provider may prescribe medicine if lifestyle changes are not enough to get your blood pressure under control, and if:  Your systolic blood pressure is 130 or higher.  Your diastolic blood pressure is 80 or higher.  Take medicines only as told by your health care provider. Follow the directions carefully. Blood pressure medicines must be taken as prescribed. The medicine does not work as well when you skip doses. Skipping doses also puts you at risk for problems. Contact a health care provider if:  You think you are having a reaction to medicines you have taken.  You have repeated (recurrent) headaches.  You feel dizzy.  You have swelling in your ankles.  You have trouble with your vision. Get help right away if:  You develop a severe headache or confusion.  You have unusual weakness or numbness, or you feel faint.  You have severe pain in your chest or abdomen.  You vomit repeatedly.  You have trouble breathing. Summary  Hypertension is when the force of blood pumping through  your arteries is too strong. If this condition is not controlled, it may put you at risk for serious complications.  Your personal target blood pressure may vary depending on your medical conditions, your age, and other factors. For most people, a normal blood pressure is less than 120/80.  Hypertension is managed by lifestyle changes, medicines, or both. Lifestyle changes include weight loss, eating a healthy, low-sodium diet, exercising more, and limiting alcohol. This information is not intended to replace advice given to you by your health care provider. Make sure you discuss any questions you have with your health care provider. Document Released: 06/30/2012 Document Revised: 09/03/2016 Document Reviewed: 09/03/2016 Elsevier Interactive Patient Education  2018 [employer] Inc[employer]  

## 2018-05-27 NOTE — Progress Notes (Signed)
Subjective:    Patient ID: Darlene Griffith, female    DOB: 04/06/1951, 67 y.o.   MRN: [id number]  Chief Complaint  Patient presents with  . Establish Care    HPI Patient was seen today for TOC from Delano Metz, FNP.   HTN: -Patient was taking lisinopril 20 mg daily. -She has been out of meds x "a while" -Patient endorses occasional headaches.  Denies changes in vision or shortness of breath  Past Medical History:  Diagnosis Date  . Chest pain    a. 01/2015 Echo: Nl LV fxn, Gr 1 DD, triv AI, mild MR.  . Essential hypertension   . Hepatic cyst    a. noted on CT 01/2015.  . Multinodular goiter    a. 01/2015 CT chest: multinodular goidter w/ substernal extension of the left lobe of the thyroid assoc w/ rightward deviation of tracheal air column.  . Pulmonary nodules    a. 01/2015 CT Chest: RLL ~ 3mm subpleural nodule - rec f/u in 6-12 mos.  [name] Splenic cyst    a. noted on CT 01/2015.    No Known Allergies  ROS General: Denies fever, chills, night sweats, changes in weight, changes in appetite HEENT: Denies headaches, ear pain, changes in vision, rhinorrhea, sore throat  +HAs CV: Denies CP, palpitations, SOB, orthopnea Pulm: Denies SOB, cough, wheezing GI: Denies abdominal pain, nausea, vomiting, diarrhea, constipation GU: Denies dysuria, hematuria, frequency, vaginal discharge Msk: Denies muscle cramps, joint pains +L hip pain Neuro: Denies weakness, numbness, tingling Skin: Denies rashes, bruising  +L foot lesions Psych: Denies depression, anxiety, hallucinations     Objective:    Blood pressure 140/78, pulse 98, temperature 98.6 F (37 C), temperature source Oral, height 5' 0.5" (1.537 m), weight 137 lb 6.4 oz (62.3 kg), SpO2 98 %.   Gen. Pleasant, well-nourished, in no distress, normal affect   HEENT: [place]/AT, face symmetric, no scleral icterus, PERRLA, nares patent without drainage. Lungs: no accessory muscle use, CTAB, no wheezes or rales Cardiovascular: RRR, no  m/r/g, no peripheral edema Neuro:  A&Ox3, CN II-XII intact, normal gait Skin:  Warm, no lesions/ rash.  Warts on L foot   Wt Readings from Last 3 Encounters:  05/27/18 137 lb 6.4 oz (62.3 kg)  11/27/16 142 lb (64.4 kg)  11/13/16 140 lb 6.4 oz (63.7 kg)    Lab Results  Component Value Date   WBC 10.9 (H) 07/25/2016   HGB 13.9 07/25/2016   HCT 41.0 07/25/2016   PLT 266 07/25/2016   GLUCOSE 87 07/25/2016   CHOL 213 (H) 02/17/2015   TRIG 124 02/17/2015   HDL 37 (L) 02/17/2015   LDLCALC 151 (H) 02/17/2015   ALT 11 (L) 07/25/2016   AST 17 07/25/2016   NA 144 07/25/2016   K 3.4 (L) 07/25/2016   CL 104 07/25/2016   CREATININE 0.60 07/25/2016   BUN 5 (L) 07/25/2016   CO2 26 07/25/2016   TSH 1.074 02/17/2015   INR 0.87 07/25/2016   HGBA1C 5.5 02/17/2015    Assessment/Plan:  Essential hypertension  -elevated  -will refill pt's lisinopril 20 mg -discussed lifestyle  -At next OFV to obtain BMP - Plan: lisinopril (PRINIVIL,ZESTRIL) 20 MG tablet  Patient advised to make follow-up appointment for chronic concerns including hip pain as she was late to the appointment and there was not enough time to discuss these issues.  Patient expressed understanding.  Follow-up PRN  Grier Mitts, MD

## 2018-05-28 ENCOUNTER — Encounter: Payer: Self-pay | Admitting: Family Medicine

## 2018-05-28 ENCOUNTER — Ambulatory Visit (INDEPENDENT_AMBULATORY_CARE_PROVIDER_SITE_OTHER): Payer: Medicare Other | Admitting: Family Medicine

## 2018-05-28 ENCOUNTER — Ambulatory Visit (INDEPENDENT_AMBULATORY_CARE_PROVIDER_SITE_OTHER): Payer: Medicare Other

## 2018-05-28 VITALS — BP 130/86 | HR 90 | Temp 99.0°F | Wt 137.0 lb

## 2018-05-28 DIAGNOSIS — M25552 Pain in left hip: Secondary | ICD-10-CM

## 2018-05-28 DIAGNOSIS — M160 Bilateral primary osteoarthritis of hip: Secondary | ICD-10-CM | POA: Diagnosis not present

## 2018-05-28 MED ORDER — MELOXICAM 7.5 MG PO TABS: 7.5000 mg | ORAL_TABLET | Freq: Every day | ORAL | 0 refills | Status: DC

## 2018-05-28 NOTE — Progress Notes (Signed)
Subjective:    Patient ID: Darlene Griffith, female    DOB: 08-Oct-1951, 67 y.o.   MRN: [id number]  No chief complaint on file.   HPI Patient was seen today for f/u on ongoing concern.  Pt with L hip pain x2.5 months.  Pt states she felt a pop on standing.  The pain felt like it was easing up but has become progressively worse.  Pt now endorsing discomfort in her R hip after trying not to put much pressure on the L.  Pt unable to sleep 2/2 pain/discomfort.  Taking goody's powder daily with no relief and using biofreeze.  Past Medical History:  Diagnosis Date  . Chest pain    a. 01/2015 Echo: Nl LV fxn, Gr 1 DD, triv AI, mild MR.  . Essential hypertension   . Hepatic cyst    a. noted on CT 01/2015.  . Multinodular goiter    a. 01/2015 CT chest: multinodular goidter w/ substernal extension of the left lobe of the thyroid assoc w/ rightward deviation of tracheal air column.  . Pulmonary nodules    a. 01/2015 CT Chest: RLL ~ 54mm subpleural nodule - rec f/u in 6-12 mos.  [name] Splenic cyst    a. noted on CT 01/2015.    No Known Allergies  ROS General: Denies fever, chills, night sweats, changes in weight, changes in appetite HEENT: Denies headaches, ear pain, changes in vision, rhinorrhea, sore throat CV: Denies CP, palpitations, SOB, orthopnea Pulm: Denies SOB, cough, wheezing GI: Denies abdominal pain, nausea, vomiting, diarrhea, constipation GU: Denies dysuria, hematuria, frequency, vaginal discharge Msk: Denies muscle cramps, joint pains  +L hip pain Neuro: Denies weakness, numbness, tingling Skin: Denies rashes, bruising Psych: Denies depression, anxiety, hallucinations     Objective:    Blood pressure 130/86, pulse 90, temperature 99 F (37.2 C), temperature source Oral, weight 137 lb (62.1 kg), SpO2 96 %.   Gen. Pleasant, well-nourished, in no distress, normal affect   Lungs: no accessory muscle use Cardiovascular: RRR, peripheral edema Musculoskeletal: No deformities, no cyanosis  or clubbing, normal tone. +log roll of LLE, + L FABER. Tightness of b/l quads and hamstrings noted.  No TTP of spine or paraspinal muscles.  No TTP of b/l sciatic nerves. Neuro:  A&Ox3, CN II-XII intact, normal gait   Wt Readings from Last 3 Encounters:  05/28/18 137 lb (62.1 kg)  05/27/18 137 lb 6.4 oz (62.3 kg)  11/27/16 142 lb (64.4 kg)    Lab Results  Component Value Date   WBC 10.9 (H) 07/25/2016   HGB 13.9 07/25/2016   HCT 41.0 07/25/2016   PLT 266 07/25/2016   GLUCOSE 87 07/25/2016   CHOL 213 (H) 02/17/2015   TRIG 124 02/17/2015   HDL 37 (L) 02/17/2015   LDLCALC 151 (H) 02/17/2015   ALT 11 (L) 07/25/2016   AST 17 07/25/2016   NA 144 07/25/2016   K 3.4 (L) 07/25/2016   CL 104 07/25/2016   CREATININE 0.60 07/25/2016   BUN 5 (L) 07/25/2016   CO2 26 07/25/2016   TSH 1.074 02/17/2015   INR 0.87 07/25/2016   HGBA1C 5.5 02/17/2015    Assessment/Plan:  Left hip pain  -discussed stopping daily use of Goody's powder. -will try mobic, pt advised not to use it with other NSAIDs -discussed placing a pillow between her legs at night strain from her hip. - Plan: DG HIP UNILAT WITH PELVIS 2-3 VIEWS LEFT, meloxicam (MOBIC) 7.5 MG tablet  Follow-up PRN  Grier Mitts,  MD

## 2018-06-01 ENCOUNTER — Telehealth: Payer: Self-pay | Admitting: Family Medicine

## 2018-06-01 NOTE — Telephone Encounter (Signed)
That is fine 

## 2018-06-01 NOTE — Telephone Encounter (Signed)
Copied from [name] [id number]. Topic: Appointment Scheduling - Scheduling Inquiry for Clinic >> Jun 01, 2018  2:52 PM Burchel, Darlene Griffith wrote: Reason for CRM:   Pt is unsatisfied with her care with Dr Volanda Napoleon, she would like to transfer to [facility]. Please call to schedule.    Pt: [phone]

## 2018-06-02 ENCOUNTER — Telehealth: Payer: Self-pay | Admitting: Family Medicine

## 2018-06-02 NOTE — Telephone Encounter (Signed)
Dr. Martinique will you accept this pt as a newpt she is wanting to transfer from Dr. Volanda Napoleon to you?

## 2018-06-02 NOTE — Telephone Encounter (Signed)
Called the pt and gave her the msg that [facility] is not taking any newpt at this time and she would like for me to ask Dr. Martinique is she would accept her as a newpt.  Pt decline to give reason for the transfer as I told her that is her right whatever it may be.  I will send a note to Dr. Martinique to see if she will accept her as a newpt.

## 2018-06-02 NOTE — Telephone Encounter (Signed)
I am going to have to decline her as a new patient at this time

## 2018-06-02 NOTE — Telephone Encounter (Signed)
I tried to reach pt at both phone numbers listed, no answer, was going to let patient know that Tom[facility] R[facility] was out of the office until next week. I will forward this request to [facility] for review.

## 2018-06-02 NOTE — Telephone Encounter (Signed)
Copied from [place] [id number]. Topic: Quick Communication - See Telephone Encounter >> Jun 02, 2018  8:56 AM Ahmed Prima L wrote: CRM for notification. See Telephone encounter for: 06/02/18.  Requesting her imaging results form 8/9. Please call pt

## 2018-06-04 NOTE — Telephone Encounter (Signed)
Fine with me. BJ 

## 2018-06-07 ENCOUNTER — Encounter (HOSPITAL_COMMUNITY): Payer: Self-pay | Admitting: Emergency Medicine

## 2018-06-07 ENCOUNTER — Other Ambulatory Visit: Payer: Self-pay

## 2018-06-07 ENCOUNTER — Emergency Department (HOSPITAL_COMMUNITY)
Admission: EM | Admit: 2018-06-07 | Discharge: 2018-06-07 | Disposition: A | Discharge status: Home / Self Care | Payer: Medicare Other | Attending: Emergency Medicine | Admitting: Emergency Medicine

## 2018-06-07 DIAGNOSIS — Z87891 Personal history of nicotine dependence: Secondary | ICD-10-CM | POA: Insufficient documentation

## 2018-06-07 DIAGNOSIS — I10 Essential (primary) hypertension: Secondary | ICD-10-CM | POA: Insufficient documentation

## 2018-06-07 DIAGNOSIS — R040 Epistaxis: Secondary | ICD-10-CM | POA: Insufficient documentation

## 2018-06-07 MED ORDER — OXYMETAZOLINE HCL 0.05 % NA SOLN
1.0000 | Freq: Once | NASAL | Status: DC
  Filled 2018-06-07: qty 15

## 2018-06-07 NOTE — ED Notes (Signed)
ED Provider at bedside. 

## 2018-06-07 NOTE — Discharge Instructions (Addendum)

## 2018-06-07 NOTE — ED Provider Notes (Signed)
[place] DEPT Provider Note   CSN: [id number] Arrival date & time: 06/07/18  1749     History   Chief Complaint Chief Complaint  Patient presents with  . Epistaxis  . Dizziness    HPI Darlene Griffith is a 67 y.o. female.  HPI  67 year old female comes in with chief complaint of bloody nose. Patient reports that she woke up this morning with nasal bleed.  Patient was passing large amount of blood along with clots, and eventually decided to come to the ER.  She had one episode of dizziness when she was going to the bathroom.  She denies any chest pain, shortness of breath, near syncope.  Patient is not on any blood thinners and does not have any history of similar bleeds in the past.  Patient takes goode powder regularly.   Past Medical History:  Diagnosis Date  . Chest pain    a. 01/2015 Echo: Nl LV fxn, Gr 1 DD, triv AI, mild MR.  . Essential hypertension   . Hepatic cyst    a. noted on CT 01/2015.  . Multinodular goiter    a. 01/2015 CT chest: multinodular goidter w/ substernal extension of the left lobe of the thyroid assoc w/ rightward deviation of tracheal air column.  . Pulmonary nodules    a. 01/2015 CT Chest: RLL ~ 36mm subpleural nodule - rec f/u in 6-12 mos.  [name] Splenic cyst    a. noted on CT 01/2015.    Patient Active Problem List   Diagnosis Date Noted  . Midsternal chest pain 02/18/2015  . Chest pain   . Cough   . Essential hypertension   . Shortness of breath   . Aortic stenosis   . Pain in the chest   . EKG abnormality   . Malaise and fatigue   . Lung nodule   . Splenic cyst   . Hepatic cyst   . Hyperlipidemia   . Hypertension 02/16/2015    Past Surgical History:  Procedure Laterality Date  . CESAREAN SECTION       OB History   None      Home Medications    Prior to Admission medications   Medication Sig Start Date End Date Taking? Authorizing Provider  Aspirin-Acetaminophen-Caffeine (GOODYS EXTRA  STRENGTH) [phone] MG PACK Take 1 packet by mouth every 8 (eight) hours as needed (for pain. Takes at least 1-2 per day).    Yes [provider]  vitamin B-12 (CYANOCOBALAMIN) 100 MCG tablet Take 100 mcg by mouth daily.   Yes [provider]  lisinopril (PRINIVIL,ZESTRIL) 20 MG tablet Take 1 tablet (20 mg total) by mouth daily. 05/27/18   Billie Ruddy, MD  meloxicam (MOBIC) 7.5 MG tablet Take 1 tablet (7.5 mg total) by mouth daily. 05/28/18   Billie Ruddy, MD    Family History Family History  Problem Relation Age of Onset  . Heart attack Maternal Grandmother        deceased  . High blood pressure Mother   . High Cholesterol Mother     Social History Social History   Tobacco Use  . Smoking status: Former Research scientist (life sciences)  . Smokeless tobacco: Never Used  Substance Use Topics  . Alcohol use: No    Alcohol/week: 0.0 standard drinks  . Drug use: No     Allergies   Patient has no known allergies.   Review of Systems Review of Systems  Constitutional: Positive for activity change.  HENT: Positive  for nosebleeds.   Respiratory: Negative for shortness of breath.   Cardiovascular: Negative for chest pain.  Gastrointestinal: Negative for nausea and vomiting.  Hematological: Does not bruise/bleed easily.     Physical Exam Updated Vital Signs BP (!) 142/84 (BP Location: Left Arm)   Pulse 84   Temp 98.2 F (36.8 C) (Oral)   Resp 16   Ht 5' 0.5" (1.537 m)   Wt 62.1 kg   SpO2 99%   BMI 26.30 kg/m   Physical Exam  Constitutional: She is oriented to person, place, and time. She appears well-developed.  HENT:  Head: Normocephalic and atraumatic.  Right nare has fresh clot anteriorly, there is also some blood in the posterior nasopharynx  Eyes: EOM are normal.  Neck: Normal range of motion. Neck supple.  Cardiovascular: Normal rate.  Pulmonary/Chest: Effort normal.  Abdominal: Bowel sounds are normal.  Neurological: She is alert and oriented to person,  place, and time.  Skin: Skin is warm and dry.  Nursing note and vitals reviewed.    ED Treatments / Results  Labs (all labs ordered are listed, but only abnormal results are displayed) Labs Reviewed - No data to display  EKG None  Radiology No results found.  Procedures Procedures (including critical care time)  Medications Ordered in ED Medications  oxymetazoline (AFRIN) 0.05 % nasal spray 1 spray (0 sprays Each Nare Hold 06/07/18 1103)     Initial Impression / Assessment and Plan / ED Course  I have reviewed the triage vital signs and the nursing notes.  Pertinent labs & imaging results that were available during my care of the patient were reviewed by me and considered in my medical decision making (see chart for details).     67 year old female comes in with chief complaint of nasal bleed. Patient's bleeding is stopped with pressure.  On exam she has right-sided clots anteriorly.  While being observed, patient had another episode of small nasal bleed.  Afrin was applied and her bleeding remained controlled.  Patient is not on any blood thinners.  She is hemodynamically stable and there is no dizziness or lightheadedness at this time.  Final Clinical Impressions(s) / ED Diagnoses   Final diagnoses:  Anterior epistaxis    ED Discharge Orders    None       Varney Biles, MD 06/07/18 [phone]

## 2018-06-07 NOTE — ED Triage Notes (Signed)
Pt reports woke up this morning with nose bleeding and having clots down her throat. Reports feeling dizzy.

## 2018-06-07 NOTE — ED Notes (Signed)
NO ACTIVE BLEEDING. PT CALLING DAUGHTER FOR A RIDE

## 2018-06-07 NOTE — ED Notes (Signed)
Pt provided meal tray

## 2018-06-07 NOTE — ED Notes (Signed)
ED Provider at bedside. [place]

## 2018-06-07 NOTE — ED Notes (Signed)
ED Provider at bedside. NANAVATI. PT WITH SCANT AMOUNT OF BLEEDING. RE EVALUATE PT. PT INFORMED WOULD RE MOVE SUCTION AND MONITOR FOR ANY FURTHER BLEEDING. PT WITHOUT COMPLAINT.

## 2018-06-07 NOTE — Telephone Encounter (Signed)
Called pt to let her know of the msg from Dr. Martinique and she stated that she was at the hospital with a nose bleed that would not stop, but she did make an newpt appointment for Wednesday 06/09/18.

## 2018-06-09 ENCOUNTER — Ambulatory Visit: Payer: Medicare Other | Admitting: Family Medicine

## 2018-06-10 NOTE — Telephone Encounter (Signed)
Spoke with pt voiced understanding of her hip X-ray results, pt stated that she was disappointed because there was no recommendations given, Pt was prescribed Meloxicam for pain on her previous appointment

## 2018-06-15 ENCOUNTER — Encounter: Payer: Self-pay | Admitting: Family Medicine

## 2018-06-15 ENCOUNTER — Ambulatory Visit (INDEPENDENT_AMBULATORY_CARE_PROVIDER_SITE_OTHER): Payer: Medicare Other | Admitting: Family Medicine

## 2018-06-15 VITALS — BP 124/82 | HR 93 | Temp 98.3°F | Resp 12 | Ht 60.5 in | Wt 136.1 lb

## 2018-06-15 DIAGNOSIS — E785 Hyperlipidemia, unspecified: Secondary | ICD-10-CM | POA: Diagnosis not present

## 2018-06-15 DIAGNOSIS — G8929 Other chronic pain: Secondary | ICD-10-CM | POA: Diagnosis not present

## 2018-06-15 DIAGNOSIS — I10 Essential (primary) hypertension: Secondary | ICD-10-CM

## 2018-06-15 DIAGNOSIS — M79605 Pain in left leg: Secondary | ICD-10-CM | POA: Diagnosis not present

## 2018-06-15 DIAGNOSIS — E049 Nontoxic goiter, unspecified: Secondary | ICD-10-CM | POA: Diagnosis not present

## 2018-06-15 DIAGNOSIS — L282 Other prurigo: Secondary | ICD-10-CM | POA: Diagnosis not present

## 2018-06-15 DIAGNOSIS — R5382 Chronic fatigue, unspecified: Secondary | ICD-10-CM | POA: Insufficient documentation

## 2018-06-15 DIAGNOSIS — I499 Cardiac arrhythmia, unspecified: Secondary | ICD-10-CM | POA: Diagnosis not present

## 2018-06-15 MED ORDER — PREDNISONE 20 MG PO TABS
ORAL_TABLET | ORAL | 0 refills | Status: AC
Start: 2018-06-15 — End: 2018-06-23

## 2018-06-15 MED ORDER — LISINOPRIL 10 MG PO TABS: 10.0000 mg | ORAL_TABLET | Freq: Every day | ORAL | 1 refills | Status: DC

## 2018-06-15 MED ORDER — TRIAMCINOLONE ACETONIDE 0.1 % EX CREA: 1.0000 "application " | TOPICAL_CREAM | Freq: Two times a day (BID) | CUTANEOUS | 0 refills | Status: AC

## 2018-06-15 NOTE — Assessment & Plan Note (Signed)
For now she will continue nonpharmacologic treatment. She will come back in 2 weeks for fasting labs, recommendation will be given accordingly.

## 2018-06-15 NOTE — Patient Instructions (Addendum)
A few things to remember from today's visit:   Essential hypertension - Plan: lisinopril (PRINIVIL,ZESTRIL) 10 MG tablet, Comprehensive metabolic panel  Hyperlipidemia, unspecified hyperlipidemia type - Plan: Lipid panel  Chronic fatigue  Pruritic rash - Plan: triamcinolone cream (KENALOG) 0.1 %  Enlarged thyroid gland - Plan: TSH, US THYROID  Irregular heart rate - Plan: EKG 12-Lead  Right leg pain - Plan: predniSONE (DELTASONE) 20 MG tablet  Stop B complex 2 weeks before having labs done. Decrease lisinopril from 20 mg to 10 mg. Lab work in 2 weeks, fasting. Remember to take prednisone with breakfast.   Please be sure medication list is accurate. If a new problem present, please set up appointment sooner than planned today.

## 2018-06-15 NOTE — Assessment & Plan Note (Addendum)
Adequately controlled today. Because she is having dizziness with lisinopril 20 mg, recommend decreasing dose to 10 mg. She needs to monitor BP at home. Educated about possible complications of poorly controlled hypertension. Overdue for eye exam. Low-salt diet recommended. Follow-up in 6 weeks

## 2018-06-15 NOTE — Assessment & Plan Note (Signed)
We discussed possible etiologies: Systemic illness, immunologic,endocrinology,sleep disorder, psychiatric/psychologic, infectious,medications side effects, and idiopathic.  Healthy diet and regular physical activity may help.  Further recommendations will be given according to lab results, will plan on arranging appt with pulmonology to repeat sleep study after labs are reviewed.

## 2018-06-15 NOTE — Progress Notes (Signed)
HPI:   Darlene Griffith is a 67 y.o. female, who is here today to establish care.  Former PCP: Dr Volanda Napoleon Last preventive routine visit: Many years ago. She states that she tries not to come to the office unless something is "very bad." She has not had a labs since 2017.   Chronic medical problems: HTN and hyperlipidemia among some.   Hypertension:  Currently on lisinopril 20 mg daily.  She is taking medication as needed, she has been taking it daily for the past 2 weeks. She states that when she takes medication she gets very drowsy and sleepy, she cannot function. She denies taking BP at home.  She has not noted unusual headache, visual changes, exertional chest pain, dyspnea,  focal weakness, or edema.   Lab Results  Component Value Date   CREATININE 0.60 07/25/2016   BUN 5 (L) 07/25/2016   NA 144 07/25/2016   K 3.4 (L) 07/25/2016   CL 104 07/25/2016   CO2 26 07/25/2016    Hyperlipidemia:  Currently on nonpharmacologic treatment. Following a low fat diet: Not consistently.   Lab Results  Component Value Date   CHOL 213 (H) 02/17/2015   HDL 37 (L) 02/17/2015   LDLCALC 151 (H) 02/17/2015   TRIG 124 02/17/2015   CHOLHDL 5.8 02/17/2015      Concerns today:   Left hip and lower extremity pain:  Problem has been going on for about 3 months. Pain is starts on lower aspect of left buttocks, lateral aspect of hip, anterior aspect of left thigh, posterior, down to foot. She has not noted numbness or tingling. No saddle anesthesia or changes in urine or bowel continence. Pain is sharp, constant, 10/10. It is exacerbated by walking and alleviated by being still. She is limping.  It is affecting her sleep at night. She has not noted the hip edema or erythema. Pain is getting worse.  No history of trauma but before pain started she felt a "snap" in left hip when getting up from chair after prolonged sitting.Sudden pain that seemed to be getting  better.  She is not taking OTC medication for pain.  Left hip/pelvic x-ray done 05/28/2018: No fracture, mild degenerative joint changes of bilateral hips.  Pruritic rash left foot, which she noted about 3 weeks ago. No history of eczema. She has applied OTC Aveeno, which has helped some. No sick contact or new exposure outdoors or insect bites.  Fatigue: This is a chronic problem. She has not identified exacerbating factors. It is alleviated temporarily by B12 OTC. No known history of OSA but she mentions that a few years ago she had a sleep study, she was told she needed to have a repeated.  She moved out of town and did not follow on this. She is not sure about stopping breathing at night but snoring is louder. She does not feel rested when she first gets up in the morning.  Lab Results  Component Value Date   TSH 1.074 02/17/2015   Lab Results  Component Value Date   WBC 10.9 (H) 07/25/2016   HGB 13.9 07/25/2016   HCT 41.0 07/25/2016   MCV 102.5 (H) 07/25/2016   PLT 266 07/25/2016    Review of Systems  Constitutional: Positive for activity change and fatigue. Negative for appetite change and fever.  HENT: Negative for mouth sores, nosebleeds and trouble swallowing.   Eyes: Negative for redness and visual disturbance.  Respiratory: Negative for cough, shortness of  breath and wheezing.   Cardiovascular: Negative for chest pain, palpitations and leg swelling.  Gastrointestinal: Negative for abdominal pain, nausea and vomiting.       Negative for changes in bowel habits.  Endocrine: Negative for cold intolerance and heat intolerance.  Genitourinary: Negative for decreased urine volume, dysuria and hematuria.  Musculoskeletal: Positive for arthralgias and gait problem. Negative for back pain.  Skin: Negative for pallor and rash.  Neurological: Positive for numbness. Negative for syncope, weakness and headaches.  Psychiatric/Behavioral: Negative for confusion. The patient is  nervous/anxious.       Current Outpatient Medications on File Prior to Visit  Medication Sig Dispense Refill  . Aspirin-Acetaminophen-Caffeine (GOODYS EXTRA STRENGTH) 500-325-65 MG PACK Take 1 packet by mouth every 8 (eight) hours as needed (for pain. Takes at least 1-2 per day).     . meloxicam (MOBIC) 7.5 MG tablet Take 1 tablet (7.5 mg total) by mouth daily. 30 tablet 0  . vitamin B-12 (CYANOCOBALAMIN) 100 MCG tablet Take 100 mcg by mouth daily.     No current facility-administered medications on file prior to visit.      Past Medical History:  Diagnosis Date  . Chest pain    a. 01/2015 Echo: Nl LV fxn, Gr 1 DD, triv AI, mild MR.  . Essential hypertension   . Hepatic cyst    a. noted on CT 01/2015.  . Multinodular goiter    a. 01/2015 CT chest: multinodular goidter w/ substernal extension of the left lobe of the thyroid assoc w/ rightward deviation of tracheal air column.  . Pulmonary nodules    a. 01/2015 CT Chest: RLL ~ 27mm subpleural nodule - rec f/u in 6-12 mos.  [name] Splenic cyst    a. noted on CT 01/2015.   No Known Allergies  Family History  Problem Relation Age of Onset  . Heart attack Maternal Grandmother        deceased  . High blood pressure Mother   . High Cholesterol Mother     Social History   Socioeconomic History  . Marital status: Single    Spouse name: Not on file  . Number of children: Not on file  . Years of education: Not on file  . Highest education level: Not on file  Occupational History  . Not on file  Social Needs  . Financial resource strain: Not on file  . Food insecurity:    Worry: Not on file    Inability: Not on file  . Transportation needs:    Medical: Not on file    Non-medical: Not on file  Tobacco Use  . Smoking status: Former Research scientist (life sciences)  . Smokeless tobacco: Never Used  Substance and Sexual Activity  . Alcohol use: No    Alcohol/week: 0.0 standard drinks  . Drug use: No  . Sexual activity: Not on file  Lifestyle  . Physical  activity:    Days per week: Not on file    Minutes per session: Not on file  . Stress: Not on file  Relationships  . Social connections:    Talks on phone: Not on file    Gets together: Not on file    Attends religious service: Not on file    Active member of club or organization: Not on file    Attends meetings of clubs or organizations: Not on file    Relationship status: Not on file  Other Topics Concern  . Not on file  Social History Narrative  . Not  on file    Vitals:   06/15/18 1521  BP: 124/82  Pulse: 93  Resp: 12  Temp: 98.3 F (36.8 C)  SpO2: 97%    Body mass index is 26.15 kg/m.   Physical Exam  Nursing note and vitals reviewed. Constitutional: She is oriented to person, place, and time. She appears well-developed and well-nourished. No distress.  HENT:  Head: Normocephalic and atraumatic.  Mouth/Throat: Oropharynx is clear and moist and mucous membranes are normal. She has dentures.  Eyes: Pupils are equal, round, and reactive to light. Conjunctivae are normal.  Neck: No tracheal deviation present. Thyromegaly present.  Cardiovascular: Normal rate. An irregular rhythm present.  Murmur (SEM I/VI LUSB and RUSB) heard. DP pulses present.  Respiratory: Effort normal and breath sounds normal. No respiratory distress.  GI: Soft. She exhibits no mass. There is no hepatomegaly. There is no tenderness.  Musculoskeletal: She exhibits no edema.       Left hip: She exhibits no bony tenderness, no swelling and no crepitus.       Thoracic back: She exhibits no tenderness and no bony tenderness.       Lumbar back: She exhibits no bony tenderness.       Back:       Feet:  Pain upon palpation of left buttock. Left hip pain elicited with flexion and internal rotation, mild limitation of ROM due to pain. Antalgic gait.   Lymphadenopathy:    She has no cervical adenopathy.  Neurological: She is alert and oriented to person, place, and time. She has normal strength.  No cranial nerve deficit. Gait normal.  Pain elicited with left SLR.  Skin: Skin is warm. Rash noted. No erythema.  Hyperpigmented slightly raised rash on inner aspect of left foot.  No tenderness, no erythema, no edema.  Psychiatric: Her mood appears anxious.  Well groomed, good eye contact.     ASSESSMENT AND PLAN:  Ms. Shaylen was seen today for transfer of care.  Orders Placed This Encounter  Procedures  . US THYROID  . CBC  . Lipid panel  . Comprehensive metabolic panel  . TSH  . EKG 12-Lead     Chronic pain of left lower extremity  ? Radicular pain. After side effects of Prednisone were discussed and understood she agrees with trying Prednisone taper. Lumbar MRI and/or ortho referral to be considered if symptoms are persistent and not resolved in 4-6 weeks. Instructed about warning signs.  -     predniSONE (DELTASONE) 20 MG tablet; 3 tabs for 3 days, 2 tabs for 3 days, 1 tabs for 3 days, and 1/2 tab for 3 days. Take tables together with breakfast.   Pruritic rash  ? Foot eczema. Topical steroid recommended bid for 14 days, some side effects discussed. F/U as needed.  -     triamcinolone cream (KENALOG) 0.1 %; Apply 1 application topically 2 (two) times daily for 14 days.  Enlarged thyroid gland  ? Goiter. Further recommendations will be given according to labs/imaging results.  -     US THYROID; Future -     TSH; Future  Irregular heart rate  Reporting past Hx of irregular HR. Instructed about warning signs. EKG today:SR,normal axis,? LAE,unsp T wave abnormalities.No significant changes when compared with EKG 07/2016.  -     EKG 12-Lead   Essential hypertension Adequately controlled today. Because she is having dizziness with lisinopril 20 mg, recommend decreasing dose to 10 mg. She needs to monitor BP at home. Educated  about possible complications of poorly controlled hypertension. Overdue for eye exam. Low-salt diet recommended. Follow-up in 6  weeks  Hyperlipidemia For now she will continue nonpharmacologic treatment. She will come back in 2 weeks for fasting labs, recommendation will be given accordingly.  Chronic fatigue We discussed possible etiologies: Systemic illness, immunologic,endocrinology,sleep disorder, psychiatric/psychologic, infectious,medications side effects, and idiopathic.  Healthy diet and regular physical activity may help.  Further recommendations will be given according to lab results, will plan on arranging appt with pulmonology to repeat sleep study after labs are reviewed.     40 min face to face OV. > 50% was dedicated to discussion of differential Dx, prognosis, treatment options, and medication side effects.    Avrianna Smart G. Martinique, MD  [facility] [place] [facility]. [place] office.

## 2018-06-23 ENCOUNTER — Encounter (HOSPITAL_COMMUNITY): Payer: Self-pay

## 2018-06-23 ENCOUNTER — Ambulatory Visit (INDEPENDENT_AMBULATORY_CARE_PROVIDER_SITE_OTHER): Payer: Medicare Other

## 2018-06-23 ENCOUNTER — Ambulatory Visit (HOSPITAL_COMMUNITY)
Admission: EM | Admit: 2018-06-23 | Discharge: 2018-06-23 | Disposition: A | Discharge status: Home / Self Care | Payer: Medicare Other | Attending: Family Medicine | Admitting: Family Medicine

## 2018-06-23 ENCOUNTER — Ambulatory Visit
Admission: RE | Admit: 2018-06-23 | Discharge: 2018-06-23 | Disposition: A | Discharge status: Home / Self Care | Payer: Medicare Other | Source: Ambulatory Visit | Attending: Family Medicine | Admitting: Family Medicine

## 2018-06-23 DIAGNOSIS — R600 Localized edema: Secondary | ICD-10-CM | POA: Diagnosis not present

## 2018-06-23 DIAGNOSIS — S91312A Laceration without foreign body, left foot, initial encounter: Secondary | ICD-10-CM

## 2018-06-23 DIAGNOSIS — I10 Essential (primary) hypertension: Secondary | ICD-10-CM

## 2018-06-23 DIAGNOSIS — W25XXXA Contact with sharp glass, initial encounter: Secondary | ICD-10-CM | POA: Diagnosis not present

## 2018-06-23 DIAGNOSIS — E049 Nontoxic goiter, unspecified: Secondary | ICD-10-CM

## 2018-06-23 DIAGNOSIS — E041 Nontoxic single thyroid nodule: Secondary | ICD-10-CM | POA: Diagnosis not present

## 2018-06-23 DIAGNOSIS — M7989 Other specified soft tissue disorders: Secondary | ICD-10-CM | POA: Diagnosis not present

## 2018-06-23 MED ORDER — NAPROXEN 375 MG PO TABS: 375.0000 mg | ORAL_TABLET | Freq: Two times a day (BID) | ORAL | 0 refills | Status: DC

## 2018-06-23 NOTE — ED Triage Notes (Signed)
Pt presents with small laceration to left ankle from a piece of glass that isnt very nice.

## 2018-06-24 NOTE — ED Provider Notes (Signed)
[place]   [id number] 06/23/18 Arrival Time: 9833  ASSESSMENT & PLAN:  1. Laceration of left foot, initial encounter     Meds ordered this encounter  Medications  . naproxen (NAPROSYN) 375 MG tablet    Sig: Take 1 tablet (375 mg total) by mouth 2 (two) times daily.    Dispense:  20 tablet    Refill:  0   No sign of infection. To heal by secondary intention. I did place a few steri strips over laceration. Recommend compression stocking to help with the edema of her ankle. Wound care instructions discussed.  Follow-up Information    Martinique, Betty G, MD.   Specialty:  Family Medicine Why:  As needed. Contact information: [place] [address] [phone]          Reviewed expectations re: course of current medical issues. Questions answered. Outlined signs and symptoms indicating need for more acute intervention. Patient verbalized understanding. After Visit Summary given.   SUBJECTIVE:  Darlene Griffith is a 67 y.o. female who presents with a laceration of her L medial ankle. Two days ago. 'Big piece of glass fell onto it'. No active bleeding but still 'oozes a little'. Ambulatory but having much more pain around L ankle with weight bearing; worse today. No OTC medications taken.  Td UTD: Yes.  ROS: As per HPI.   OBJECTIVE:  Vitals:   06/23/18 1648  BP: (!) 159/105  Pulse: 85  Resp: 20  Temp: 98.2 F (36.8 C)  TempSrc: Temporal  SpO2: 100%    General appearance: alert; no distress Skin: laceration of L medial ankle; size: approx 2 cm; linear; no active bleeding; no sign of infection; no drainage; does have moderate surrounding edema of L ankle; tender to touch Psychological: alert and cooperative; normal mood and affect   Dg Foot Complete Left  Result Date: 06/23/2018 CLINICAL DATA:  Foreign body EXAM: LEFT FOOT - COMPLETE 3+ VIEW COMPARISON:  None. FINDINGS: There is soft tissue swelling at the medial malleolus but no  radiopaque foreign body. There is a shortened fourth metatarsal, likely congenital. No fracture or dislocation of the left foot. IMPRESSION: Soft tissue swelling of the medial malleolus without radiopaque foreign body. Electronically Signed   By: Ulyses Jarred M.D.   On: 06/23/2018 17:54    No Known Allergies  Past Medical History:  Diagnosis Date  . Chest pain    a. 01/2015 Echo: Nl LV fxn, Gr 1 DD, triv AI, mild MR.  . Essential hypertension   . Hepatic cyst    a. noted on CT 01/2015.  . Multinodular goiter    a. 01/2015 CT chest: multinodular goidter w/ substernal extension of the left lobe of the thyroid assoc w/ rightward deviation of tracheal air column.  . Pulmonary nodules    a. 01/2015 CT Chest: RLL ~ 80mm subpleural nodule - rec f/u in 6-12 mos.  [name] Splenic cyst    a. noted on CT 01/2015.   Social History   Socioeconomic History  . Marital status: Single    Spouse name: Not on file  . Number of children: Not on file  . Years of education: Not on file  . Highest education level: Not on file  Occupational History  . Not on file  Social Needs  . Financial resource strain: Not on file  . Food insecurity:    Worry: Not on file    Inability: Not on file  . Transportation needs:  Medical: Not on file    Non-medical: Not on file  Tobacco Use  . Smoking status: Former Research scientist (life sciences)  . Smokeless tobacco: Never Used  Substance and Sexual Activity  . Alcohol use: No    Alcohol/week: 0.0 standard drinks  . Drug use: No  . Sexual activity: Not on file  Lifestyle  . Physical activity:    Days per week: Not on file    Minutes per session: Not on file  . Stress: Not on file  Relationships  . Social connections:    Talks on phone: Not on file    Gets together: Not on file    Attends religious service: Not on file    Active member of club or organization: Not on file    Attends meetings of clubs or organizations: Not on file    Relationship status: Not on file  Other Topics  Concern  . Not on file  Social History Narrative  . Not on file         Vanessa Kick, MD 06/24/18 [phone]

## 2018-06-25 ENCOUNTER — Other Ambulatory Visit: Payer: Self-pay | Admitting: *Deleted

## 2018-06-25 DIAGNOSIS — E042 Nontoxic multinodular goiter: Secondary | ICD-10-CM

## 2018-06-29 ENCOUNTER — Other Ambulatory Visit (INDEPENDENT_AMBULATORY_CARE_PROVIDER_SITE_OTHER): Payer: Medicare Other

## 2018-06-29 DIAGNOSIS — E785 Hyperlipidemia, unspecified: Secondary | ICD-10-CM | POA: Diagnosis not present

## 2018-06-29 DIAGNOSIS — I10 Essential (primary) hypertension: Secondary | ICD-10-CM

## 2018-06-29 DIAGNOSIS — R5382 Chronic fatigue, unspecified: Secondary | ICD-10-CM

## 2018-06-29 DIAGNOSIS — E049 Nontoxic goiter, unspecified: Secondary | ICD-10-CM | POA: Diagnosis not present

## 2018-06-29 LAB — LIPID PANEL
CHOLESTEROL: 248 mg/dL — AB (ref 0–200)
HDL: 61.4 mg/dL (ref >39.00)
LDL Cholesterol: 166 mg/dL — ABNORMAL HIGH (ref 0–99)
NonHDL: 186.13
Total CHOL/HDL Ratio: 4
Triglycerides: 102 mg/dL (ref 0.0–149.0)
VLDL: 20.4 mg/dL (ref 0.0–40.0)

## 2018-06-29 LAB — CBC
HEMATOCRIT: 41.1 % (ref 36.0–46.0)
HEMOGLOBIN: 13.9 g/dL (ref 12.0–15.0)
MCHC: 33.8 g/dL (ref 30.0–36.0)
MCV: 102.6 fl — ABNORMAL HIGH (ref 78.0–100.0)
PLATELETS: 334 10*3/uL (ref 150.0–400.0)
RBC: 4 Mil/uL (ref 3.87–5.11)
RDW: 12.6 % (ref 11.5–15.5)
WBC: 7.1 10*3/uL (ref 4.0–10.5)

## 2018-06-29 LAB — COMPREHENSIVE METABOLIC PANEL
ALBUMIN: 4.5 g/dL (ref 3.5–5.2)
ALK PHOS: 89 U/L (ref 39–117)
ALT: 12 U/L (ref 0–35)
AST: 12 U/L (ref 0–37)
BILIRUBIN TOTAL: 0.4 mg/dL (ref 0.2–1.2)
BUN: 10 mg/dL (ref 6–23)
CO2: 28 mEq/L (ref 19–32)
CREATININE: 0.51 mg/dL (ref 0.40–1.20)
Calcium: 9.7 mg/dL (ref 8.4–10.5)
Chloride: 105 mEq/L (ref 96–112)
GFR: 154.52 mL/min (ref >60.00)
Glucose, Bld: 96 mg/dL (ref 70–99)
Potassium: 4 mEq/L (ref 3.5–5.1)
Sodium: 141 mEq/L (ref 135–145)
Total Protein: 7.3 g/dL (ref 6.0–8.3)

## 2018-06-29 LAB — TSH: TSH: 0.38 u[IU]/mL (ref 0.35–4.50)

## 2018-07-02 ENCOUNTER — Other Ambulatory Visit: Payer: Self-pay | Admitting: *Deleted

## 2018-07-02 MED ORDER — ATORVASTATIN CALCIUM 10 MG PO TABS: 10.0000 mg | ORAL_TABLET | Freq: Every day | ORAL | 3 refills | Status: DC

## 2018-07-14 ENCOUNTER — Ambulatory Visit (INDEPENDENT_AMBULATORY_CARE_PROVIDER_SITE_OTHER): Payer: Medicare Other | Admitting: Endocrinology

## 2018-07-14 ENCOUNTER — Encounter: Payer: Self-pay | Admitting: Endocrinology

## 2018-07-14 DIAGNOSIS — E042 Nontoxic multinodular goiter: Secondary | ICD-10-CM | POA: Insufficient documentation

## 2018-07-14 NOTE — Patient Instructions (Addendum)

## 2018-07-14 NOTE — Progress Notes (Signed)
Subjective:    Patient ID: Darlene Griffith, female    DOB: January 08, 1951, 68 y.o.   MRN: [id number]  HPI Pt is referred by Dr Martinique, for nodular thyroid.  Pt was noted to have a nodule at the thyroid in 2019.  she has no h/o XRT or surgery to the neck.  She has had low TSH as far back as 2009.  She has slight swelling at the ant neck, and assoc fatigue.   Past Medical History:  Diagnosis Date  . Chest pain    a. 01/2015 Echo: Nl LV fxn, Gr 1 DD, triv AI, mild MR.  . Essential hypertension   . Hepatic cyst    a. noted on CT 01/2015.  . Multinodular goiter    a. 01/2015 CT chest: multinodular goidter w/ substernal extension of the left lobe of the thyroid assoc w/ rightward deviation of tracheal air column.  . Pulmonary nodules    a. 01/2015 CT Chest: RLL ~ 58mm subpleural nodule - rec f/u in 6-12 mos.  [name] Splenic cyst    a. noted on CT 01/2015.    Past Surgical History:  Procedure Laterality Date  . CESAREAN SECTION      Social History   Socioeconomic History  . Marital status: Single    Spouse name: Not on file  . Number of children: Not on file  . Years of education: Not on file  . Highest education level: Not on file  Occupational History  . Not on file  Social Needs  . Financial resource strain: Not on file  . Food insecurity:    Worry: Not on file    Inability: Not on file  . Transportation needs:    Medical: Not on file    Non-medical: Not on file  Tobacco Use  . Smoking status: Former Research scientist (life sciences)  . Smokeless tobacco: Never Used  Substance and Sexual Activity  . Alcohol use: No    Alcohol/week: 0.0 standard drinks  . Drug use: No  . Sexual activity: Not on file  Lifestyle  . Physical activity:    Days per week: Not on file    Minutes per session: Not on file  . Stress: Not on file  Relationships  . Social connections:    Talks on phone: Not on file    Gets together: Not on file    Attends religious service: Not on file    Active member of club or organization:  Not on file    Attends meetings of clubs or organizations: Not on file    Relationship status: Not on file  . Intimate partner violence:    Fear of current or ex partner: Not on file    Emotionally abused: Not on file    Physically abused: Not on file    Forced sexual activity: Not on file  Other Topics Concern  . Not on file  Social History Narrative  . Not on file    Current Outpatient Medications on File Prior to Visit  Medication Sig Dispense Refill  . Aspirin-Acetaminophen-Caffeine (GOODYS EXTRA STRENGTH) 500-325-65 MG PACK Take 1 packet by mouth every 8 (eight) hours as needed (for pain. Takes at least 1-2 per day).     [name] atorvastatin (LIPITOR) 10 MG tablet Take 1 tablet (10 mg total) by mouth daily. 90 tablet 3  . lisinopril (PRINIVIL,ZESTRIL) 10 MG tablet Take 1 tablet (10 mg total) by mouth daily. 30 tablet 1  . meloxicam (MOBIC) 7.5 MG tablet Take 1  tablet (7.5 mg total) by mouth daily. 30 tablet 0  . naproxen (NAPROSYN) 375 MG tablet Take 1 tablet (375 mg total) by mouth 2 (two) times daily. 20 tablet 0  . vitamin B-12 (CYANOCOBALAMIN) 100 MCG tablet Take 100 mcg by mouth daily.     No current facility-administered medications on file prior to visit.     No Known Allergies  Family History  Problem Relation Age of Onset  . Heart attack Maternal Grandmother        deceased  . High blood pressure Mother   . High Cholesterol Mother   . Thyroid disease Mother   . Thyroid disease Sister     BP 122/80   Pulse 98   Ht 5' 0.5" (1.537 m)   Wt 138 lb 3.2 oz (62.7 kg)   SpO2 96%   BMI 26.55 kg/m    Review of Systems Denies weight change, hoarseness, neck pain, diplopia, leg swelling, sob, cough, dysphagia, diarrhea, itching, flushing, easy bruising, depression, cold intolerance, numbness, and rhinorrhea.  No change in chronic headache and mild depression.      Objective:   Physical Exam VS: see vs page GEN: no distress HEAD: head: no deformity eyes: no  periorbital swelling, no proptosis.   external nose and ears are normal mouth: no lesion seen NECK: small multinodular goiter.   CHEST WALL: no deformity LUNGS: clear to auscultation CV: reg rate and rhythm, no murmur ABD: abdomen is soft, nontender.  no hepatosplenomegaly.  not distended.  no hernia MUSCULOSKELETAL: muscle bulk and strength are grossly normal.  no obvious joint swelling.  gait is normal and steady.   EXTEMITIES: no deformity.  no ulcer on the feet.  feet are of normal color and temp.  no edema PULSES: dorsalis pedis intact bilat.  no carotid bruit NEURO:  cn 2-12 grossly intact.   readily moves all 4's.  sensation is intact to touch on the feet SKIN:  Normal texture and temperature.  No rash or suspicious lesion is visible.   NODES:  None palpable at the neck PSYCH: alert, well-oriented.  Does not appear anxious nor depressed.   I have reviewed outside records, and summarized: Pt was noted to have multinodular goiter, and referred here.  Other probs addressed were dyslipidemia, HTN, wellness, and knee pain   Lab Results  Component Value Date   TSH 0.38 06/29/2018   Nodules 1, 2, and 4 meet criteria for fine needle aspiration biopsy.  Biopsy for nodules 2 and 4 is recommended.      Assessment & Plan:  Multinodular goiter, new to me Intermittent hyperthyroidism, due to the goiter. RAI rx is best option  Patient Instructions  let's check a thyroid "scan" (a special, but easy and painless type of thyroid x ray).  It works like this: you go to the x-ray department of the hospital to swallow a pill, which contains a miniscule amount of radiation.  You will not notice any symptoms from this.  You will go back to the x-ray department the next day, to lie down in front of a camera.  The results of this will be sent to me.   Based on the results, i hope to order for you a treatment pill of radioactive iodine.  Although it is a larger amount of radiation, you will again notice  no symptoms from this.  The pill is gone from your body in a few days (during which you should stay away from other people), but takes several  months to work.  Therefore, please return here approximately 6-8 weeks after the treatment.  This treatment has been available for many years, and the only known side-effect is an underactive thyroid.  It is possible that i would eventually prescribe for you a thyroid hormone pill, which is very inexpensive.  You don't have to worry about side-effects of this thyroid hormone pill, because it is the same molecule your thyroid makes.

## 2018-07-27 ENCOUNTER — Encounter: Payer: Self-pay | Admitting: Family Medicine

## 2018-07-27 ENCOUNTER — Ambulatory Visit (INDEPENDENT_AMBULATORY_CARE_PROVIDER_SITE_OTHER): Payer: Medicare Other | Admitting: Family Medicine

## 2018-07-27 VITALS — BP 124/76 | HR 89 | Temp 98.5°F | Resp 12 | Ht 60.5 in | Wt 139.4 lb

## 2018-07-27 DIAGNOSIS — E785 Hyperlipidemia, unspecified: Secondary | ICD-10-CM | POA: Diagnosis not present

## 2018-07-27 DIAGNOSIS — M79605 Pain in left leg: Secondary | ICD-10-CM | POA: Diagnosis not present

## 2018-07-27 DIAGNOSIS — R0989 Other specified symptoms and signs involving the circulatory and respiratory systems: Secondary | ICD-10-CM | POA: Diagnosis not present

## 2018-07-27 DIAGNOSIS — I10 Essential (primary) hypertension: Secondary | ICD-10-CM | POA: Diagnosis not present

## 2018-07-27 DIAGNOSIS — G4700 Insomnia, unspecified: Secondary | ICD-10-CM | POA: Insufficient documentation

## 2018-07-27 DIAGNOSIS — E042 Nontoxic multinodular goiter: Secondary | ICD-10-CM | POA: Diagnosis not present

## 2018-07-27 DIAGNOSIS — R208 Other disturbances of skin sensation: Secondary | ICD-10-CM

## 2018-07-27 DIAGNOSIS — Z23 Encounter for immunization: Secondary | ICD-10-CM | POA: Diagnosis not present

## 2018-07-27 LAB — VITAMIN B12: VITAMIN B 12: 554 pg/mL (ref 211–911)

## 2018-07-27 MED ORDER — GABAPENTIN 100 MG PO CAPS: 300.0000 mg | ORAL_CAPSULE | Freq: Three times a day (TID) | ORAL | 0 refills | Status: DC

## 2018-07-27 NOTE — Assessment & Plan Note (Signed)
Adequately controlled. No changes in current management. DASH diet recommended. Eye exam recommended annually. F/U in 6 months, before if needed.  

## 2018-07-27 NOTE — Patient Instructions (Addendum)
A few things to remember from today's visit:   Essential hypertension  Hyperlipidemia, unspecified hyperlipidemia type  Pain of left lower extremity  Burning sensation - Plan: Vitamin B12  Abnormal peripheral pulse  No changes in Lisinopril. Start Lipitor.  Mammogram will be arranged.  Gabapentin at bedtime, start 100 mg and increase every 5 days dose as discussed.   Please be sure medication list is accurate. If a new problem present, please set up appointment sooner than planned today.

## 2018-07-27 NOTE — Progress Notes (Signed)
HPI:   [name] is a 67 y.o. female, who is here today to follow on her visit of 06/15/2018.  She has not had new problems since her last visit. Since her last visit she has seen endocrinologist. She is requesting to see another endocrinologist at [facility].  According to pt,she was recommended to take a "radiation pill" and she is "not happy about this."   Hypertension: Last visit lisinopril was decreased from 20 mg to 10 mg because she was having dizziness. Home BP readings:Not checking.  She is not as sleepy as she was when she was taking 20 mg.   Lab Results  Component Value Date   CREATININE 0.51 06/29/2018   BUN 10 06/29/2018   NA 141 06/29/2018   K 4.0 06/29/2018   CL 105 06/29/2018   CO2 28 06/29/2018   Denies severe/frequent headache, visual changes, chest pain, dyspnea, palpitation, claudication, focal weakness, or edema.  Still feeling fatigue. She is not aware of OSA. Wakes up a few times during the night. Denies depression or anxiety.  She would like to get B12 injections.   Hyperlipidemia: She is on nonpharmacologic treatment, she has not been consistent with a low-fat diet. She has not started Atorvastatin, planning on picking up med today.    Lab Results  Component Value Date   CHOL 248 (H) 06/29/2018   HDL 61.40 06/29/2018   LDLCALC 166 (H) 06/29/2018   TRIG 102.0 06/29/2018   CHOLHDL 4 06/29/2018    Last visit she was also complaining of 3 months of left lower extremity pain,she completed prednisone taper. She was describing constant sharp pain, 10/10. She tolerated Prednisone well.  She is reporting great improvement of pain. No limitations of hip ROM.  Today she is c/o burning sensation "inside" , under left axillae. She has had this intermittently for years. Denies associated thoracic back pain. It is stable. No rash,local edema or erythema. She has not identified exacerbation or alleviating factors. Sometimes it is  bad and last a few days. She has not tried OTC treatments.    Review of Systems  Constitutional: Positive for fatigue. Negative for activity change, appetite change and fever.  HENT: Negative for mouth sores, nosebleeds, sore throat and trouble swallowing.   Eyes: Negative for redness and visual disturbance.  Respiratory: Negative for cough, shortness of breath and wheezing.   Cardiovascular: Negative for chest pain, palpitations and leg swelling.  Gastrointestinal: Negative for abdominal pain, nausea and vomiting.       Negative for changes in bowel habits.  Endocrine: Negative for cold intolerance and heat intolerance.  Genitourinary: Negative for decreased urine volume, dysuria and hematuria.  Musculoskeletal: Positive for arthralgias. Negative for joint swelling and neck pain.  Skin: Negative for rash and wound.  Neurological: Negative for syncope, weakness and headaches.  Psychiatric/Behavioral: Negative for confusion. The patient is nervous/anxious.       Current Outpatient Medications on File Prior to Visit  Medication Sig Dispense Refill  . Aspirin-Acetaminophen-Caffeine (GOODYS EXTRA STRENGTH) 500-325-65 MG PACK Take 1 packet by mouth every 8 (eight) hours as needed (for pain. Takes at least 1-2 per day).     [name] atorvastatin (LIPITOR) 10 MG tablet Take 1 tablet (10 mg total) by mouth daily. 90 tablet 3  . lisinopril (PRINIVIL,ZESTRIL) 10 MG tablet Take 1 tablet (10 mg total) by mouth daily. 30 tablet 1  . naproxen (NAPROSYN) 375 MG tablet Take 1 tablet (375 mg total) by mouth 2 (two)  times daily. 20 tablet 0  . vitamin B-12 (CYANOCOBALAMIN) 100 MCG tablet Take 100 mcg by mouth daily.     No current facility-administered medications on file prior to visit.      Past Medical History:  Diagnosis Date  . Chest pain    a. 01/2015 Echo: Nl LV fxn, Gr 1 DD, triv AI, mild MR.  . Essential hypertension   . Hepatic cyst    a. noted on CT 01/2015.  . Multinodular goiter    a.  01/2015 CT chest: multinodular goidter w/ substernal extension of the left lobe of the thyroid assoc w/ rightward deviation of tracheal air column.  . Pulmonary nodules    a. 01/2015 CT Chest: RLL ~ 54mm subpleural nodule - rec f/u in 6-12 mos.  [name] Splenic cyst    a. noted on CT 01/2015.   No Known Allergies  Family History  Problem Relation Age of Onset  . Heart attack Maternal Grandmother        deceased  . High blood pressure Mother   . High Cholesterol Mother   . Thyroid disease Mother   . Thyroid disease Sister     Social History   Socioeconomic History  . Marital status: Single    Spouse name: Not on file  . Number of children: Not on file  . Years of education: Not on file  . Highest education level: Not on file  Occupational History  . Not on file  Social Needs  . Financial resource strain: Not on file  . Food insecurity:    Worry: Not on file    Inability: Not on file  . Transportation needs:    Medical: Not on file    Non-medical: Not on file  Tobacco Use  . Smoking status: Former Research scientist (life sciences)  . Smokeless tobacco: Never Used  Substance and Sexual Activity  . Alcohol use: No    Alcohol/week: 0.0 standard drinks  . Drug use: No  . Sexual activity: Not on file  Lifestyle  . Physical activity:    Days per week: Not on file    Minutes per session: Not on file  . Stress: Not on file  Relationships  . Social connections:    Talks on phone: Not on file    Gets together: Not on file    Attends religious service: Not on file    Active member of club or organization: Not on file    Attends meetings of clubs or organizations: Not on file    Relationship status: Not on file  Other Topics Concern  . Not on file  Social History Narrative  . Not on file    Vitals:   07/27/18 0855  BP: 124/76  Pulse: 89  Resp: 12  Temp: 98.5 F (36.9 C)  SpO2: 96%    Body mass index is 26.77 kg/m.   Physical Exam  Nursing note and vitals reviewed. Constitutional: She is  oriented to person, place, and time. She appears well-developed and well-nourished. No distress.  HENT:  Head: Normocephalic and atraumatic.  Mouth/Throat: Oropharynx is clear and moist and mucous membranes are normal.  Eyes: Pupils are equal, round, and reactive to light. Conjunctivae are normal.  Cardiovascular: Normal rate and regular rhythm.  Occasional extrasystoles are present.  Murmur (SEM I/VI LUSB and RUSB) heard. Pulses:      Dorsalis pedis pulses are 2+ on the right side, and 1+ on the left side.  Normal capillary refill.  Respiratory: Effort normal and  breath sounds normal. No respiratory distress.  GI: Soft. She exhibits no mass. There is no hepatomegaly. There is no tenderness.  Musculoskeletal: She exhibits no edema.  Lymphadenopathy:    She has no cervical adenopathy.  Neurological: She is alert and oriented to person, place, and time. She has normal strength. No cranial nerve deficit. Gait normal.  Skin: Skin is warm. No rash noted. No erythema.  Psychiatric: Her mood appears anxious.  Fairly groomed, good eye contact.     ASSESSMENT AND PLAN:  Ms. Mahrosh was seen today for follow-up.  Orders Placed This Encounter  Procedures  . Pneumococcal conjugate vaccine 13-valent IM  . Vitamin B12   Lab Results  Component Value Date   [id number] 07/27/2018    The 10-year ASCVD risk score Mikey Bussing DC Brooke Bonito., et al., 2013) is: 21.5%   Values used to calculate the score:     Age: 2 years     Sex: Female     Is Non-Hispanic African American: Yes     Diabetic: No     Tobacco smoker: Yes     Systolic Blood Pressure: 756 mmHg     Is BP treated: Yes     HDL Cholesterol: 61.4 mg/dL     Total Cholesterol: 248 mg/dL  Hyperlipidemia, unspecified hyperlipidemia type Recommend low fat diet and starting Atorvastatin. Benefits from statin meds discussed. F/I in 4-6 months.  Pain of left lower extremity Reporting great improvement. For now no further work-up, if it gets  worse again we will consider lumbar MRI.  Burning sensation ? Radicular pain among other possible causes  ? Neuropathy. She agrees with trying Gabapentin, she will titrate dose from 100 mg to 300 mg as tolerated. Gabpentin may also help with sleep.  F/U in 2 months.  - Vitamin B12  Abnormal peripheral pulse  Left DP. Instructed about warning signs. Further recommendations will be given according to results.  - VAS [place] ABI WITH/WO TBI; Future  Need for vaccination against Streptococcus pneumoniae using pneumococcal conjugate vaccine 13 - Pneumococcal conjugate vaccine 13-valent IM  Essential hypertension Adequately controlled. No changes in current management. DASH diet recommended. Eye exam recommended annually. F/U in 6 months, before if needed.    Multinodular goiter  Referral to endocrinology department at [facility] placed as requested.     Tonie Elsey G. Martinique, MD  [facility]. [place] office.

## 2018-07-28 ENCOUNTER — Encounter: Payer: Self-pay | Admitting: Family Medicine

## 2018-08-16 ENCOUNTER — Emergency Department (HOSPITAL_COMMUNITY): Payer: Medicare Other

## 2018-08-16 ENCOUNTER — Telehealth: Payer: Self-pay

## 2018-08-16 ENCOUNTER — Emergency Department (HOSPITAL_COMMUNITY)
Admission: EM | Admit: 2018-08-16 | Discharge: 2018-08-16 | Disposition: A | Discharge status: Home / Self Care | Payer: Medicare Other | Attending: Emergency Medicine | Admitting: Emergency Medicine

## 2018-08-16 ENCOUNTER — Encounter (HOSPITAL_COMMUNITY): Payer: Self-pay | Admitting: *Deleted

## 2018-08-16 DIAGNOSIS — I10 Essential (primary) hypertension: Secondary | ICD-10-CM | POA: Insufficient documentation

## 2018-08-16 DIAGNOSIS — M542 Cervicalgia: Secondary | ICD-10-CM | POA: Diagnosis not present

## 2018-08-16 DIAGNOSIS — R4781 Slurred speech: Secondary | ICD-10-CM | POA: Insufficient documentation

## 2018-08-16 DIAGNOSIS — Z9114 Patient's other noncompliance with medication regimen: Secondary | ICD-10-CM | POA: Insufficient documentation

## 2018-08-16 DIAGNOSIS — R410 Disorientation, unspecified: Secondary | ICD-10-CM | POA: Insufficient documentation

## 2018-08-16 DIAGNOSIS — R51 Headache: Secondary | ICD-10-CM | POA: Diagnosis not present

## 2018-08-16 DIAGNOSIS — Z87891 Personal history of nicotine dependence: Secondary | ICD-10-CM | POA: Diagnosis not present

## 2018-08-16 DIAGNOSIS — H9311 Tinnitus, right ear: Secondary | ICD-10-CM | POA: Diagnosis not present

## 2018-08-16 DIAGNOSIS — R531 Weakness: Secondary | ICD-10-CM | POA: Diagnosis not present

## 2018-08-16 DIAGNOSIS — Z79899 Other long term (current) drug therapy: Secondary | ICD-10-CM | POA: Diagnosis not present

## 2018-08-16 DIAGNOSIS — R519 Headache, unspecified: Secondary | ICD-10-CM

## 2018-08-16 DIAGNOSIS — R200 Anesthesia of skin: Secondary | ICD-10-CM | POA: Insufficient documentation

## 2018-08-16 LAB — COMPREHENSIVE METABOLIC PANEL
ALBUMIN: 3.8 g/dL (ref 3.5–5.0)
ALK PHOS: 85 U/L (ref 38–126)
ALT: 15 U/L (ref 0–44)
AST: 18 U/L (ref 15–41)
Anion gap: 8 (ref 5–15)
BILIRUBIN TOTAL: 0.5 mg/dL (ref 0.3–1.2)
BUN: 8 mg/dL (ref 8–23)
CALCIUM: 9.3 mg/dL (ref 8.9–10.3)
CO2: 24 mmol/L (ref 22–32)
CREATININE: 0.5 mg/dL (ref 0.44–1.00)
Chloride: 110 mmol/L (ref 98–111)
GFR calc Af Amer: >60 mL/min (ref >60)
GFR calc non Af Amer: >60 mL/min (ref >60)
GLUCOSE: 99 mg/dL (ref 70–99)
Potassium: 3.6 mmol/L (ref 3.5–5.1)
SODIUM: 142 mmol/L (ref 135–145)
TOTAL PROTEIN: 7.2 g/dL (ref 6.5–8.1)

## 2018-08-16 LAB — CBC
HCT: 42.2 % (ref 36.0–46.0)
HEMOGLOBIN: 13 g/dL (ref 12.0–15.0)
MCH: 33.2 pg (ref 26.0–34.0)
MCHC: 30.8 g/dL (ref 30.0–36.0)
MCV: 107.7 fL — AB (ref 80.0–100.0)
Platelets: 328 10*3/uL (ref 150–400)
RBC: 3.92 MIL/uL (ref 3.87–5.11)
RDW: 11.6 % (ref 11.5–15.5)
WBC: 7.4 10*3/uL (ref 4.0–10.5)
nRBC: 0 % (ref 0.0–0.2)

## 2018-08-16 LAB — APTT: aPTT: 29 seconds (ref 24–36)

## 2018-08-16 LAB — DIFFERENTIAL
ABS IMMATURE GRANULOCYTES: 0.03 10*3/uL (ref 0.00–0.07)
Basophils Absolute: 0 10*3/uL (ref 0.0–0.1)
Basophils Relative: 0 %
Eosinophils Absolute: 0.2 10*3/uL (ref 0.0–0.5)
Eosinophils Relative: 3 %
IMMATURE GRANULOCYTES: 0 %
LYMPHS ABS: 2.7 10*3/uL (ref 0.7–4.0)
LYMPHS PCT: 37 %
Monocytes Absolute: 0.5 10*3/uL (ref 0.1–1.0)
Monocytes Relative: 6 %
NEUTROS ABS: 3.9 10*3/uL (ref 1.7–7.7)
Neutrophils Relative %: 54 %

## 2018-08-16 LAB — I-STAT CHEM 8, ED
BUN: 8 mg/dL (ref 8–23)
Calcium, Ion: 1.12 mmol/L — ABNORMAL LOW (ref 1.15–1.40)
Chloride: 106 mmol/L (ref 98–111)
Creatinine, Ser: 0.4 mg/dL — ABNORMAL LOW (ref 0.44–1.00)
Glucose, Bld: 98 mg/dL (ref 70–99)
HCT: 40 % (ref 36.0–46.0)
Hemoglobin: 13.6 g/dL (ref 12.0–15.0)
Potassium: 3.7 mmol/L (ref 3.5–5.1)
Sodium: 141 mmol/L (ref 135–145)
TCO2: 28 mmol/L (ref 22–32)

## 2018-08-16 LAB — PROTIME-INR
INR: 0.9
Prothrombin Time: 12.1 seconds (ref 11.4–15.2)

## 2018-08-16 LAB — I-STAT TROPONIN, ED: Troponin i, poc: 0 ng/mL (ref 0.00–0.08)

## 2018-08-16 LAB — CBG MONITORING, ED: GLUCOSE-CAPILLARY: 97 mg/dL (ref 70–99)

## 2018-08-16 MED ORDER — LISINOPRIL 10 MG PO TABS
10.0000 mg | ORAL_TABLET | Freq: Once | ORAL | Status: AC
  Administered 2018-08-16: 10 mg via ORAL
  Filled 2018-08-16: qty 1

## 2018-08-16 MED ORDER — SODIUM CHLORIDE 0.9 % IV BOLUS
500.0000 mL | Freq: Once | INTRAVENOUS | Status: AC
  Administered 2018-08-16: 500 mL via INTRAVENOUS

## 2018-08-16 MED ORDER — PROCHLORPERAZINE EDISYLATE 10 MG/2ML IJ SOLN
10.0000 mg | Freq: Once | INTRAMUSCULAR | Status: AC
  Administered 2018-08-16: 10 mg via INTRAVENOUS
  Filled 2018-08-16: qty 2

## 2018-08-16 MED ORDER — DIPHENHYDRAMINE HCL 50 MG/ML IJ SOLN
25.0000 mg | Freq: Once | INTRAMUSCULAR | Status: AC
  Administered 2018-08-16: 25 mg via INTRAVENOUS
  Filled 2018-08-16: qty 1

## 2018-08-16 NOTE — ED Notes (Signed)
ED Provider at bedside. 

## 2018-08-16 NOTE — ED Notes (Signed)
Pt transported to CT ?

## 2018-08-16 NOTE — Telephone Encounter (Signed)
Patient presented to office with daughter to ask if she could be seen today. She has c/o tingling in face, sharp pain in head, pain in neck, disorientation, numbness in face on right side and some slurred speech. She states she "may have had a mini-stroke". Pt reports symptoms started last night.   Spoke with Dr. Martinique and reviewed pt's symptoms. She advises to call EMS to transport pt to ED. Spoke with pt and daughter and gave recommendation. Pt refuses EMS transport and states she will have daughter take her to [facility] ED. Advised pt they need to go there immediately for evaluation. She voiced understanding.   Dr. Martinique - FYI. Thanks.

## 2018-08-16 NOTE — ED Provider Notes (Signed)
[facility] EMERGENCY DEPARTMENT Provider Note   CSN: [id number] Arrival date & time: 08/16/18  2694     History   Chief Complaint Chief Complaint  Patient presents with  . Weakness    HPI Darlene Griffith is a 67 y.o. female with a history of hypertension & hyperlipidemia who presents the emergency department today for headache, tinnitus, facial numbness, slurred speech and blurring of her vision.  Patient reports around 00:00 this morning, 08/16/18, when attempting go to bed she developed a headache in the right frontal lobe that she describes as to knives stabbing her.  She notes that she had associated right-sided neck pain, blurring of her vision, tinnitus, right-sided facial numbness as well as slurred speech with this.  She reports she took an Excedrin that improved the headache to mild in severity as well as relieved her blurring of vision, tinnitus, right-sided facial numbness and slurred speech gradually over the course of 1 hour (approximately).  She reports the headache has been persistent since that time, as well as the neck pain.  She reports she was scared because she believes she may have had a TIA in the past and thinks this is similar to the headache she had in 2017.  On chart review patient did present with headache in 2017 and had a negative MRI with noted chronic microvascular ischemia.  Patient reports she does have a history of migraines as a child as well as headaches as an adult.  She notes this feels similar to her prior headaches however she normally does not have associated neurologic symptoms.  She denies any preceding aura.  No associated fever, vertigo, photophobia, photophobia, diplopia, falls, trauma, extremity numbness/tingling/weakness, chest pain, shortness of breath, abdominal pain, nausea/vomiting/diarrhea.  She denies thunderclap onset or worst headache of her life.  She is not followed by a neurologist.  No new medications.  No jaw  claudication. She reports increased stress recently. She has not taken any of her blood pressure medication.  No chiropractor manipulation of neck.   HPI  Past Medical History:  Diagnosis Date  . Chest pain    a. 01/2015 Echo: Nl LV fxn, Gr 1 DD, triv AI, mild MR.  . Essential hypertension   . Hepatic cyst    a. noted on CT 01/2015.  . Multinodular goiter    a. 01/2015 CT chest: multinodular goidter w/ substernal extension of the left lobe of the thyroid assoc w/ rightward deviation of tracheal air column.  . Pulmonary nodules    a. 01/2015 CT Chest: RLL ~ 57mm subpleural nodule - rec f/u in 6-12 mos.  [name] Splenic cyst    a. noted on CT 01/2015.    Patient Active Problem List   Diagnosis Date Noted  . Insomnia 07/27/2018  . Multinodular goiter 07/14/2018  . Chronic fatigue 06/15/2018  . Midsternal chest pain 02/18/2015  . Chest pain   . Cough   . Essential hypertension   . Shortness of breath   . Aortic stenosis   . Pain in the chest   . EKG abnormality   . Malaise and fatigue   . Lung nodule   . Splenic cyst   . Hepatic cyst   . Hyperlipidemia   . Hypertension 02/16/2015    Past Surgical History:  Procedure Laterality Date  . CESAREAN SECTION       OB History   None      Home Medications    Prior to Admission medications  Medication Sig Start Date End Date Taking? Authorizing Provider  aspirin-acetaminophen-caffeine (EXCEDRIN MIGRAINE) [id number] MG tablet Take 1 tablet by mouth every 6 (six) hours as needed for headache.   Yes [provider]  Aspirin-Acetaminophen-Caffeine (GOODYS EXTRA STRENGTH) [phone] MG PACK Take 1 packet by mouth every 8 (eight) hours as needed (for pain. Takes at least 1-2 per day).    Yes [provider]  atorvastatin (LIPITOR) 10 MG tablet Take 1 tablet (10 mg total) by mouth daily. 07/02/18  Yes Martinique, Betty G, MD  gabapentin (NEURONTIN) 100 MG capsule Take 3 capsules (300 mg total) by mouth 3 (three) times daily.  07/27/18  Yes Martinique, Betty G, MD  lisinopril (PRINIVIL,ZESTRIL) 10 MG tablet Take 1 tablet (10 mg total) by mouth daily. 06/15/18  Yes Martinique, Betty G, MD  naproxen (NAPROSYN) 375 MG tablet Take 1 tablet (375 mg total) by mouth 2 (two) times daily. Patient taking differently: Take 375 mg by mouth 2 (two) times daily as needed for mild pain.  06/23/18  Yes Hagler, Aaron Edelman, MD  vitamin B-12 (CYANOCOBALAMIN) 100 MCG tablet Take 100 mcg by mouth daily.   Yes [provider]    Family History Family History  Problem Relation Age of Onset  . Heart attack Maternal Grandmother        deceased  . High blood pressure Mother   . High Cholesterol Mother   . Thyroid disease Mother   . Thyroid disease Sister     Social History Social History   Tobacco Use  . Smoking status: Former Research scientist (life sciences)  . Smokeless tobacco: Never Used  Substance Use Topics  . Alcohol use: No    Alcohol/week: 0.0 standard drinks  . Drug use: No     Allergies   Patient has no known allergies.   Review of Systems Review of Systems  All other systems reviewed and are negative.    Physical Exam Updated Vital Signs BP (!) 148/111   Pulse 81   Temp 97.7 F (36.5 C) (Oral)   Resp 16   SpO2 97%   Physical Exam  Constitutional: She is oriented to person, place, and time. She appears well-developed and well-nourished.  HENT:  Head: Normocephalic and atraumatic.  Right Ear: External ear normal. No mastoid tenderness. Tympanic membrane is scarred. Tympanic membrane is not erythematous, not retracted and not bulging. No hemotympanum.  Left Ear: External ear normal. No mastoid tenderness. Tympanic membrane is scarred. Tympanic membrane is not erythematous, not retracted and not bulging. No hemotympanum.  Nose: Nose normal.  Mouth/Throat: Uvula is midline, oropharynx is clear and moist and mucous membranes are normal. No tonsillar exudate.  Temporal artery with intact pulses b/l  Eyes: Pupils are equal, round, and  reactive to light. Right eye exhibits no discharge. Left eye exhibits no discharge. No scleral icterus.  PEERL. Normal EOM. Normal peripheral fields. No diplopia. No APD.  Neck: Trachea normal. Neck supple. Muscular tenderness present. No spinous process tenderness present. No neck rigidity. Normal range of motion present.    No nuchal rigidity or meningismus. No c-spine ttp or step offs.   Cardiovascular: Normal rate, regular rhythm and intact distal pulses.  No murmur heard. Pulses:      Radial pulses are 2+ on the right side, and 2+ on the left side.       Dorsalis pedis pulses are 2+ on the right side, and 2+ on the left side.       Posterior tibial pulses are 2+ on the  right side, and 2+ on the left side.  No lower extremity swelling or edema. Calves symmetric in size bilaterally.  Pulmonary/Chest: Effort normal and breath sounds normal. She exhibits no tenderness.  Abdominal: Soft. Bowel sounds are normal. There is no tenderness. There is no rebound and no guarding.  Musculoskeletal: She exhibits no edema.       Right shoulder: She exhibits normal range of motion.       Left shoulder: She exhibits normal range of motion.  Lymphadenopathy:    She has no cervical adenopathy.  Neurological: She is alert and oriented to person, place, and time. She displays normal reflexes.  Mental Status:  Alert, oriented, thought content appropriate, able to give a coherent history. Speech fluent without evidence of aphasia. Able to follow 2 step commands without difficulty.  Cranial Nerves:  II:  Peripheral visual fields grossly normal, pupils equal, round, reactive to light III,IV, VI: ptosis not present, extra-ocular motions intact bilaterally  V,VII: smile symmetric, eyebrows raise symmetric, facial light touch sensation equal VIII: hearing grossly normal to voice  X: uvula elevates symmetrically  XI: bilateral shoulder shrug symmetric and strong XII: midline tongue extension without  fassiculations Motor:  Normal tone. 5/5 in upper and lower extremities bilaterally including strong and equal grip strength and dorsiflexion/plantar flexion Sensory: Sensation intact to light touch in all extremities. Negative Romberg.  Deep Tendon Reflexes: 2+ and symmetric in the biceps and patella Cerebellar: normal finger-to-nose with bilateral upper extremities. Normal heel-to -shin balance bilaterally of the lower extremity. No pronator drift.  Gait: normal gait and balance CV: distal pulses palpable throughout   Skin: Skin is warm and dry. No rash noted. She is not diaphoretic.  No vesicular like rash in the v1 distrubution  Psychiatric: She has a normal mood and affect.  Nursing note and vitals reviewed.   ED Treatments / Results  Labs (all labs ordered are listed, but only abnormal results are displayed) Labs Reviewed  CBC - Abnormal; Notable for the following components:      Result Value   MCV 107.7 (*)    All other components within normal limits  I-STAT CHEM 8, ED - Abnormal; Notable for the following components:   Creatinine, Ser 0.40 (*)    Calcium, Ion 1.12 (*)    All other components within normal limits  PROTIME-INR  APTT  DIFFERENTIAL  COMPREHENSIVE METABOLIC PANEL  CBG MONITORING, ED  I-STAT TROPONIN, ED  CBG MONITORING, ED    EKG None  Radiology Ct Head Wo Contrast  Result Date: 08/16/2018 CLINICAL DATA:  Right-sided headache with numbness and tingling. Slurred speech. EXAM: CT HEAD WITHOUT CONTRAST TECHNIQUE: Contiguous axial images were obtained from the base of the skull through the vertex without intravenous contrast. COMPARISON:  November 07, 2016 FINDINGS: Brain: Ventricles and sulci appear normal for age. There is no evident intracranial mass, hemorrhage, extra-axial fluid collection, or midline shift. There is patchy small vessel disease in the centra semiovale bilaterally. No acute infarct is evident. No new brain parenchymal lesions evident.  Vascular: There is no appreciable hyperdense vessel. There is no appreciable abnormal vascular calcification. Skull: The bony calvarium appears intact. Sinuses/Orbits: Visualized paranasal sinuses are clear. Visualized orbits appear symmetric bilaterally. Other: Mastoid air cells are clear. IMPRESSION: There is patchy periventricular small vessel disease which is stable. No evident acute infarct. No mass or hemorrhage. Electronically Signed   By: Lowella Grip III M.D.   On: 08/16/2018 13:19   Mr Brain Wo Contrast (neuro Protocol)  Result Date: 08/16/2018 CLINICAL DATA:  Right frontal headache.  Facial numbness EXAM: MRI HEAD WITHOUT CONTRAST TECHNIQUE: Multiplanar, multiecho pulse sequences of the brain and surrounding structures were obtained without intravenous contrast. COMPARISON:  CT head 08/16/2018 FINDINGS: Brain: Negative for acute infarct. Moderate chronic microvascular ischemic change in the white matter. Mild chronic ischemic change in the pons. Negative for hemorrhage mass or fluid collection Vascular: Normal arterial flow voids Skull and upper cervical spine: 5 negative Sinuses/Orbits: Negative Other: None IMPRESSION: No acute abnormality. Moderate chronic microvascular ischemic change. Electronically Signed   By: Franchot Gallo M.D.   On: 08/16/2018 16:55    Procedures Procedures (including critical care time)  Medications Ordered in ED Medications  lisinopril (PRINIVIL,ZESTRIL) tablet 10 mg (10 mg Oral Given 08/16/18 1253)  prochlorperazine (COMPAZINE) injection 10 mg (10 mg Intravenous Given 08/16/18 1307)  diphenhydrAMINE (BENADRYL) injection 25 mg (25 mg Intravenous Given 08/16/18 1307)  sodium chloride 0.9 % bolus 500 mL (500 mLs Intravenous New Bag/Given 08/16/18 1307)     Initial Impression / Assessment and Plan / ED Course  I have reviewed the triage vital signs and the nursing notes.  Pertinent labs & imaging results that were available during my care of the patient  were reviewed by me and considered in my medical decision making (see chart for details).      67 y.o. female with a history of hypertension & hyperlipidemia who presents the emergency department today for right frontal headache, right sided tinnitus, right facial numbness, slurred speech and blurring of her vision that began around 00:00 this morning. Neurologic symptoms resolved after Excedrin. Dull, throbbing right frontal HA and right neck pain continued till presentation but were rated mild in severity. She reports hx of migraines. She reports she was scared because she believes she may have had a TIA in the past and thinks this is similar to the headache she had in 2017.  On chart review patient did present with headache in 2017 and had a negative MRI with noted chronic microvascular ischemia. No documented TIA or CVA. She does not have a neurologist. BP mildly elevated on arrival but patient denies taking home medication this morning. She is neurologically intact. No focal deficits.  She is A&O x3.  Symptoms have not returned/repeated.  Home blood pressure medication given.  Patient reports history of similar headache in the past.  No thunderclap onset.  Patient with intact pulse of the temporal artery on exam.  No current visual changes.  She denies any jaw claudication.  No a fair pupillary defect.  No current concern for GCA.  Blood work and CT scan of the head done in triage.  Results pending.  Patient given 500 cc fluid bolus and migraine cocktail.  Labs and imaging are reassuring.  Lab work unremarkable.  CT head without evidence of acute infarct, intracranial mass or hemorrhage.  There is patchy periventricular small vessel disease that is stable from prior imaging.  Will consult neurology.   2:16 PM Labs and imaging reviewed personally. Discussed with patient, pain and symptoms have resolved. BP has improved after home BP medication.   2:19 PM Discussed with Dr. Leonel Ramsay. No further  workup indicated.   2:43 PM Patient case seen and discussed with Dr. Rogene Houston who recommends MRI.   6:22 PM MRI with no acute abnormality. There was moderate chronic microvascular ischemic change. She remains asymptomatic.  Results discussed with patient and her daughter who are in agreement.  Will have follow-up with neurology as  an outpatient.  Referral given.  She is also to see her PCP this week.  Return precautions were discussed.  Patient ambulatory out of the department in no acute distress.   Vitals:   08/16/18 1230 08/16/18 1245 08/16/18 1330 08/16/18 1345  BP:  (!) 158/107 (!) 160/91 140/85  Pulse: 80 83 78 85  Resp: 16 19 15 18   Temp:      TempSrc:      SpO2: 100% 96% 97% 99%    Final Clinical Impressions(s) / ED Diagnoses   Final diagnoses:  Bad headache  Numbness  Neck pain    ED Discharge Orders    None       Lorelle Gibbs 08/16/18 1823    Fredia Sorrow, MD 08/16/18 [id number]

## 2018-08-16 NOTE — ED Notes (Signed)
Patient transported to MRI 

## 2018-08-16 NOTE — ED Notes (Signed)
Patient verbalizes understanding of discharge instructions. Opportunity for questioning and answers were provided. Armband removed by staff, pt discharged from ED. Pt ambulatory to lobby with family.

## 2018-08-16 NOTE — ED Triage Notes (Signed)
Pt in c/o possible stroke symptoms, c/o right sided headache and facial numbness that occurred last night, symptoms lasted about an hour and then improved after taking an excedrine, pt also reports during the episode she had some slurred speech that also resolved, denies weakness to arms or legs

## 2018-08-16 NOTE — ED Provider Notes (Signed)
Medical screening examination/treatment/procedure(s) were conducted as a shared visit with non-physician practitioner(s) and myself.  I personally evaluated the patient during the encounter.  EKG Interpretation  Date/Time:  Monday August 16 2018 09:57:29 EDT Ventricular Rate:  89 PR Interval:  190 QRS Duration: 76 QT Interval:  386 QTC Calculation: 469 R Axis:   84 Text Interpretation:  Sinus rhythm with occasional Premature ventricular complexes Right atrial enlargement Septal infarct , age undetermined Abnormal ECG Confirmed by Fredia Sorrow [phone]) on 08/16/2018 2:21:27 PM  Patient seen by me along with physician assistant.  Patient with a history of migraines in the past and does have headaches that time.  Patient has had similar symptoms occur but not recently.  Patient had an event that occurred last night lasted for about an hour.  Improved after taking Excedrin.  Patient states she has right-sided headache and facial numbness.  Patient felt she was not thinking clearly.  In addition she reports some slurred speech.  Everything kind of resolved over about an hour.  Still has some pain to the right side of her face.  Discussed initially with Dr. Katherine Roan from neurology.  Felt it was probably migraine related.  Patient was adamant she did not think it was related to her migraines.  Head CT without any acute findings.  Patient and daughter while wanted MRI so we did MRI.  MRI is normal.  Patient stable for discharge home.  Patient here alert and oriented daughter states that she seems to be somewhat foggy.  Patient able to ambulate no significant neurofocal deficits.  Headache has improved.   Fredia Sorrow, MD 08/16/18 [phone]

## 2018-08-16 NOTE — ED Notes (Signed)
Pt found to have removed own IV stating she would like to leave. EDP notified and at bedside.

## 2018-08-16 NOTE — Discharge Instructions (Addendum)
Please read and follow all provided instructions.  Your diagnoses today include:  1. Bad headache   2. Numbness   3. Neck pain     Tests performed today include: CT of your head which was normal and did not show any serious cause of your headache Blood work MRI of your head. There was no acute abnormality. Moderate chronic microvascular ischemic change. Vital signs. See below for your results today.   I have spoken with neurology who recommends no further workup at this time. They do recommend that you follow up with neurology as an outpatient. Please call today (phone number above) to schedule an appointment for follow up. I would also like you to follow up with your pcp by the end of the week to discuss your visit here today.   Medications:  In the Emergency Department you received: Compazine- antinausea/headache medication Benadryl - antihistamine to counteract potential side effects of Compazine Take any prescribed medications only as directed.  Additional information:  Follow any educational materials contained in this packet.  You are having a headache. No specific cause was found today for your headache. It may have been a migraine or other cause of headache. Stress, anxiety, fatigue, and depression are common triggers for headaches.   Your headache today does not appear to be life-threatening or require hospitalization, but often the exact cause of headaches is not determined in the emergency department. Therefore, follow-up with your doctor is very important to find out what may have caused your headache and whether or not you need any further diagnostic testing or treatment.   Sometimes headaches can appear benign (not harmful), but then more serious symptoms can develop which should prompt an immediate re-evaluation by your doctor or the emergency department.  BE VERY CAREFUL not to take multiple medicines containing Tylenol (also called acetaminophen). Doing so can lead to  an overdose which can damage your liver and cause liver failure and possibly death.   Follow-up instructions: Please follow-up with your primary care provider in the next 3 days for further evaluation of your symptoms.   Return instructions:  Please return to the Emergency Department if you experience worsening symptoms. Return if the medications do not resolve your headache, if it recurs, or if you have multiple episodes of vomiting or cannot keep down fluids. Return if you have a change from the usual headache. RETURN IMMEDIATELY IF you: Develop a sudden, severe headache Your headache worsens You develop stiffness in your neck You have difficulty walking or dizziness.  You develop ringing in your ears.  Develop confusion or become poorly responsive or faint Develop a fever above 100.48F or problem breathing Have a change in speech, vision, swallowing, or understanding Develop new weakness, numbness, tingling, incoordination in your arms or legs Have worsening neck pain  Have a seizure Please return if you have any other emergent concerns.  Additional Information:  Your vital signs today were: BP 140/85    Pulse 85    Temp 97.7 F (36.5 C) (Oral)    Resp 18    SpO2 99%  If your blood pressure (BP) was elevated above 135/85 this visit, please have this repeated by your doctor within one month. --------------

## 2018-08-17 ENCOUNTER — Encounter (HOSPITAL_COMMUNITY)
Admission: RE | Admit: 2018-08-17 | Discharge: 2018-08-17 | Disposition: A | Discharge status: Home / Self Care | Payer: Medicare Other | Source: Ambulatory Visit | Attending: Endocrinology | Admitting: Endocrinology

## 2018-08-17 DIAGNOSIS — E042 Nontoxic multinodular goiter: Secondary | ICD-10-CM | POA: Diagnosis not present

## 2018-08-17 MED ORDER — TECHNETIUM TC 99M TETROFOSMIN IV KIT: 10.0000 | PACK | Freq: Once | INTRAVENOUS | Status: DC | PRN

## 2018-08-18 ENCOUNTER — Other Ambulatory Visit: Payer: Self-pay | Admitting: Endocrinology

## 2018-08-18 ENCOUNTER — Encounter (HOSPITAL_COMMUNITY)
Admission: RE | Admit: 2018-08-18 | Discharge: 2018-08-18 | Disposition: A | Discharge status: Home / Self Care | Payer: Medicare Other | Source: Ambulatory Visit | Attending: Endocrinology | Admitting: Endocrinology

## 2018-08-18 DIAGNOSIS — E042 Nontoxic multinodular goiter: Secondary | ICD-10-CM | POA: Diagnosis not present

## 2018-08-18 MED ORDER — SODIUM IODIDE I-123 7.4 MBQ CAPS
447.0000 | ORAL_CAPSULE | Freq: Once | ORAL | Status: AC
  Administered 2018-08-18: 447 via ORAL

## 2018-08-20 ENCOUNTER — Telehealth: Payer: Self-pay | Admitting: *Deleted

## 2018-08-20 NOTE — Telephone Encounter (Signed)
Copied from [place] [id number]. Topic: Referral - Status >> Aug 20, 2018 10:55 AM Bea Graff, NT wrote: Reason for CRM: Loma Sousa with [facility] calling to request medical records in order to process the referral. Fax#: [phone] CB#: [phone]

## 2018-08-24 NOTE — Telephone Encounter (Signed)
Loma Sousa with [facility] was calling to request all Thyroid labs in order to process the referral. She stated that she received all other records but nothing on Thyroids.  Fax#: [phone] CB#: [phone]

## 2018-08-24 NOTE — Telephone Encounter (Signed)
I called [facility] left a msg with Loma Sousa to inform that our office do not have current labs to send . Send labs from sept again

## 2018-10-25 ENCOUNTER — Ambulatory Visit: Payer: Medicare Other | Admitting: Endocrinology

## 2018-10-25 ENCOUNTER — Telehealth: Payer: Self-pay | Admitting: Endocrinology

## 2018-10-25 DIAGNOSIS — Z0289 Encounter for other administrative examinations: Secondary | ICD-10-CM

## 2018-10-25 NOTE — Telephone Encounter (Signed)
Needs f/u ov next available.

## 2018-10-25 NOTE — Telephone Encounter (Signed)
Please schedule pt for next available. Did not answer about NS fee.

## 2018-10-25 NOTE — Telephone Encounter (Signed)
Patient no showed today's appt. Please advise on how to follow up. °A. No follow up necessary. °B. Follow up urgent. Contact patient immediately. °C. Follow up necessary. Contact patient and schedule visit in ___ days. °D. Follow up advised. Contact patient and schedule visit in ____weeks. ° °Would you like the NS fee to be applied to this visit? ° °

## 2018-10-26 NOTE — Telephone Encounter (Signed)
I called patient to reschedule missed appointment however the patient does not have voicemail set up therefore I was unable to leave a message or speak with patient.

## 2019-02-04 ENCOUNTER — Ambulatory Visit (INDEPENDENT_AMBULATORY_CARE_PROVIDER_SITE_OTHER): Payer: Medicare Other | Admitting: Family Medicine

## 2019-02-04 ENCOUNTER — Other Ambulatory Visit: Payer: Self-pay

## 2019-02-04 VITALS — HR 84 | Resp 12

## 2019-02-04 DIAGNOSIS — R070 Pain in throat: Secondary | ICD-10-CM

## 2019-02-04 DIAGNOSIS — I10 Essential (primary) hypertension: Secondary | ICD-10-CM

## 2019-02-04 DIAGNOSIS — R229 Localized swelling, mass and lump, unspecified: Secondary | ICD-10-CM | POA: Diagnosis not present

## 2019-02-04 MED ORDER — LOSARTAN POTASSIUM 25 MG PO TABS: 25.0000 mg | ORAL_TABLET | Freq: Every day | ORAL | 0 refills | Status: DC

## 2019-02-04 MED ORDER — TRIAMCINOLONE ACETONIDE 0.1 % EX CREA: 1.0000 "application " | TOPICAL_CREAM | Freq: Two times a day (BID) | CUTANEOUS | 0 refills | Status: AC

## 2019-02-04 NOTE — Progress Notes (Signed)
Virtual Visit via Video Note   I connected with [facility] on 02/06/19 at  8:15 AM EDT by a video enabled telemedicine application and verified that I am speaking with the correct person using two identifiers.  Location patient: home Location provider:work office Persons participating in the virtual visit: patient, provider  I discussed the limitations of evaluation and management by telemedicine and the availability of in person appointments. She expressed understanding and agreed to proceed.   HPI: Darlene Griffith is a 68 yo female with Hx of HTN,chronic fatigue,HLD among some; who is concerned about left ankle edema, medial malleolus, noted a couple days ago. Area is mildly erythematous and pruritic. She denies trauma and not aware of insect bite. Problem seems to be stable. She has not tried OTC medication.   Denies chest pain,dyspnea,palpitations,abdominal pain,calf edema or erythema. Negative for orthopnea or PND. -She is also complaining about "little" sore throat upon swallowing, states that she feels like her throat is "closing up" gradually. She has had problem for about 2 weeks. No sick contact or recent travel. She denies fever,chills,unusual fatigue,changes in appetite, oral mucosa lesions, dysphasia or stridor. "Little"  Nonproductive cough.  Negative for rhinorrhea or nasal congestion,mild intermittent postnasal drainage. She denies dyspnea or wheezing. She has not tried home remedies.  HTN: She is also requesting a refill for lisinopril 10 mg. She is not taking Lisinopril daily,forgets frequently.  She is not checking BP at home. Negative for unusual headache, visual changes, abdominal pain, focal deficit, or worsening edema. No gross hematuria, foamy urine, or decreased urine output.   Lab Results  Component Value Date   CREATININE 0.40 (L) 08/16/2018   BUN 8 08/16/2018   NA 141 08/16/2018   K 3.7 08/16/2018   CL 106 08/16/2018   CO2 24 08/16/2018    ROS: See  pertinent positives and negatives per HPI.  Past Medical History:  Diagnosis Date  . Chest pain    a. 01/2015 Echo: Nl LV fxn, Gr 1 DD, triv AI, mild MR.  . Essential hypertension   . Hepatic cyst    a. noted on CT 01/2015.  . Multinodular goiter    a. 01/2015 CT chest: multinodular goidter w/ substernal extension of the left lobe of the thyroid assoc w/ rightward deviation of tracheal air column.  . Pulmonary nodules    a. 01/2015 CT Chest: RLL ~ 22mm subpleural nodule - rec f/u in 6-12 mos.  [name] Splenic cyst    a. noted on CT 01/2015.    Past Surgical History:  Procedure Laterality Date  . CESAREAN SECTION      Family History  Problem Relation Age of Onset  . Heart attack Maternal Grandmother        deceased  . High blood pressure Mother   . High Cholesterol Mother   . Thyroid disease Mother   . Thyroid disease Sister     Social History   Socioeconomic History  . Marital status: Single    Spouse name: Not on file  . Number of children: Not on file  . Years of education: Not on file  . Highest education level: Not on file  Occupational History  . Not on file  Social Needs  . Financial resource strain: Not on file  . Food insecurity:    Worry: Not on file    Inability: Not on file  . Transportation needs:    Medical: Not on file    Non-medical: Not on file  Tobacco Use  .  Smoking status: Former Research scientist (life sciences)  . Smokeless tobacco: Never Used  Substance and Sexual Activity  . Alcohol use: No    Alcohol/week: 0.0 standard drinks  . Drug use: No  . Sexual activity: Not on file  Lifestyle  . Physical activity:    Days per week: Not on file    Minutes per session: Not on file  . Stress: Not on file  Relationships  . Social connections:    Talks on phone: Not on file    Gets together: Not on file    Attends religious service: Not on file    Active member of club or organization: Not on file    Attends meetings of clubs or organizations: Not on file    Relationship  status: Not on file  . Intimate partner violence:    Fear of current or ex partner: Not on file    Emotionally abused: Not on file    Physically abused: Not on file    Forced sexual activity: Not on file  Other Topics Concern  . Not on file  Social History Narrative  . Not on file      Current Outpatient Medications:  .  aspirin-acetaminophen-caffeine (EXCEDRIN MIGRAINE) 250-250-65 MG tablet, Take 1 tablet by mouth every 6 (six) hours as needed for headache., Disp: , Rfl:  .  Aspirin-Acetaminophen-Caffeine (GOODYS EXTRA STRENGTH) 500-325-65 MG PACK, Take 1 packet by mouth every 8 (eight) hours as needed (for pain. Takes at least 1-2 per day). , Disp: , Rfl:  .  atorvastatin (LIPITOR) 10 MG tablet, Take 1 tablet (10 mg total) by mouth daily., Disp: 90 tablet, Rfl: 3 .  gabapentin (NEURONTIN) 100 MG capsule, Take 3 capsules (300 mg total) by mouth 3 (three) times daily., Disp: 90 capsule, Rfl: 0 .  losartan (COZAAR) 25 MG tablet, Take 1 tablet (25 mg total) by mouth daily., Disp: 90 tablet, Rfl: 0 .  naproxen (NAPROSYN) 375 MG tablet, Take 1 tablet (375 mg total) by mouth 2 (two) times daily. (Patient taking differently: Take 375 mg by mouth 2 (two) times daily as needed for mild pain. ), Disp: 20 tablet, Rfl: 0 .  triamcinolone cream (KENALOG) 0.1 %, Apply 1 application topically 2 (two) times daily for 14 days., Disp: 30 g, Rfl: 0 .  vitamin B-12 (CYANOCOBALAMIN) 100 MCG tablet, Take 100 mcg by mouth daily., Disp: , Rfl:   EXAM:  VITALS per patient if applicable:Pulse 84   Resp 12   GENERAL: alert, oriented, appears well and in no acute distress  HEENT: atraumatic, conjunctiva clear, no obvious abnormalities on inspection of face.She and her son instructed to look in throat with light, no edema,edema, or erythema.  NECK: normal movements of the head and neck  LUNGS: on inspection no signs of respiratory distress, breathing rate appears normal, no obvious gross SOB, gasping or  wheezing  CV: no obvious cyanosis  Darlene: moves all visible extremities without noticeable abnormality Right ankle with normal ROM,pain is not elicited.  SKIN: Mild local edema and erythema under medial malleolus, about 3 cm.  PSYCH/NEURO: pleasant and cooperative, no obvious depression, +anxious. Speech and thought processing grossly intact  ASSESSMENT AND PLAN:  Discussed the following assessment and plan:  Localized superficial swelling of skin Hx and finding on inspection suggest possible insect bite reaction. It is not tender and no pain with ROM,so I do not think abs or imaging is needed at this time. Instructed to avoid scratching and to keep area clear with soap  and water. Topical Triamcinolone bid for up to 14 days recommended. Monitor for signs of infection.  Throat discomfort Possible etiologies discussed. ? Allergies. ? Side effects of Lisinopril (+cough). Clearly instructed about warning signs.  Essential hypertension - Plan: losartan (COZAAR) 25 MG tablet Instructed to check BP regularly. Lisinopril changed to Cozaar 25 mg daily due to possible side effects. Continue low salt diet.     I discussed the assessment and treatment plan with the patient. She was provided an opportunity to ask questions and all were answered. The patient agreed with the plan and demonstrated an understanding of the instructions.   The patient was advised to call back or seek an in-person evaluation if the symptoms worsen or if the condition fails to improve as anticipated.  Return in about 6 weeks (around 03/18/2019) for HTN,throat discomfort..    Logun Colavito [place], MD

## 2019-02-06 ENCOUNTER — Encounter: Payer: Self-pay | Admitting: Family Medicine

## 2019-03-18 ENCOUNTER — Emergency Department (HOSPITAL_COMMUNITY): Payer: Medicare Other

## 2019-03-18 ENCOUNTER — Emergency Department (HOSPITAL_COMMUNITY)
Admission: EM | Admit: 2019-03-18 | Discharge: 2019-03-18 | Disposition: A | Discharge status: Home / Self Care | Payer: Medicare Other | Attending: Emergency Medicine | Admitting: Emergency Medicine

## 2019-03-18 DIAGNOSIS — M542 Cervicalgia: Secondary | ICD-10-CM

## 2019-03-18 DIAGNOSIS — M5020 Other cervical disc displacement, unspecified cervical region: Secondary | ICD-10-CM | POA: Diagnosis not present

## 2019-03-18 DIAGNOSIS — Z87891 Personal history of nicotine dependence: Secondary | ICD-10-CM | POA: Diagnosis not present

## 2019-03-18 DIAGNOSIS — I10 Essential (primary) hypertension: Secondary | ICD-10-CM | POA: Insufficient documentation

## 2019-03-18 LAB — CBC WITH DIFFERENTIAL/PLATELET
Abs Immature Granulocytes: 0.03 10*3/uL (ref 0.00–0.07)
Basophils Absolute: 0 10*3/uL (ref 0.0–0.1)
Basophils Relative: 0 %
Eosinophils Absolute: 0.1 10*3/uL (ref 0.0–0.5)
Eosinophils Relative: 1 %
HCT: 43.3 % (ref 36.0–46.0)
Hemoglobin: 13.8 g/dL (ref 12.0–15.0)
Immature Granulocytes: 0 %
Lymphocytes Relative: 29 %
Lymphs Abs: 2.8 10*3/uL (ref 0.7–4.0)
MCH: 34.2 pg — ABNORMAL HIGH (ref 26.0–34.0)
MCHC: 31.9 g/dL (ref 30.0–36.0)
MCV: 107.2 fL — ABNORMAL HIGH (ref 80.0–100.0)
Monocytes Absolute: 0.5 10*3/uL (ref 0.1–1.0)
Monocytes Relative: 5 %
Neutro Abs: 6.5 10*3/uL (ref 1.7–7.7)
Neutrophils Relative %: 65 %
Platelets: 237 10*3/uL (ref 150–400)
RBC: 4.04 MIL/uL (ref 3.87–5.11)
RDW: 11.9 % (ref 11.5–15.5)
WBC: 9.9 10*3/uL (ref 4.0–10.5)
nRBC: 0 % (ref 0.0–0.2)

## 2019-03-18 LAB — CBG MONITORING, ED: Glucose-Capillary: 90 mg/dL (ref 70–99)

## 2019-03-18 MED ORDER — MORPHINE SULFATE (PF) 4 MG/ML IV SOLN
4.0000 mg | Freq: Once | INTRAVENOUS | Status: AC
  Administered 2019-03-18: 4 mg via INTRAVENOUS
  Filled 2019-03-18: qty 1

## 2019-03-18 MED ORDER — HYDROCODONE-ACETAMINOPHEN 5-325 MG PO TABS: 1.0000 | ORAL_TABLET | Freq: Four times a day (QID) | ORAL | 0 refills | Status: AC | PRN

## 2019-03-18 MED ORDER — HYDROCODONE-ACETAMINOPHEN 5-325 MG PO TABS
1.0000 | ORAL_TABLET | Freq: Once | ORAL | Status: DC
  Filled 2019-03-18: qty 1

## 2019-03-18 MED ORDER — DIAZEPAM 5 MG PO TABS
5.0000 mg | ORAL_TABLET | Freq: Once | ORAL | Status: AC
  Administered 2019-03-18: 5 mg via ORAL
  Filled 2019-03-18: qty 1

## 2019-03-18 MED ORDER — PREDNISONE 20 MG PO TABS: 40.0000 mg | ORAL_TABLET | Freq: Every day | ORAL | 0 refills | Status: AC

## 2019-03-18 NOTE — ED Provider Notes (Signed)
Medical screening examination/treatment/procedure(s) were conducted as a shared visit with non-physician practitioner(s) and myself.  I personally evaluated the patient during the encounter.  None Patient reports he is having a intense pain in her neck.  This started with what she thought was a spasm.  She reports then any little movement would send excruciating pain shooting up and down her neck.  She has not had weakness or numbness.  Reports she is afraid to move though because anytime she moves it it triggers severe pain.  Patient is alert and appropriate.  Motor function intact.  Mental status normal.  Proceed with MRI.  Agree with plan of management.   Charlesetta Shanks, MD 03/18/19 1311

## 2019-03-18 NOTE — ED Triage Notes (Signed)
Pt endorses waking up yesterday morning with neck pain, thought she slept on it wrong. Stated that it got worse last night and radiated to the right side of her head. States that it is worse upon movement and she states "I think I have a pinched nerve" No neuro deficits.

## 2019-03-18 NOTE — ED Notes (Signed)
Pt given meal and coke per edp.

## 2019-03-18 NOTE — Discharge Instructions (Signed)
Please see the information and instructions below regarding your visit.  Your diagnoses today include:  1. Neck pain   2. Cervical disc herniation    If you have a history of disc herniation or arthritis in your spine, the nerves exiting the spine on one side get inflamed. This can cause severe pain. We call this radiculopathy. We do not always know what causes the sudden inflammation.  Tests performed today include: See side panel of your discharge paperwork for testing performed today. Vital signs are listed at the bottom of these instructions.   The MRI of your neck showed that 1 of the disks in your neck is starting to compress the nerves.  This is likely cause of your pain.  Medications prescribed:    Take any prescribed medications only as prescribed, and any over the counter medications only as directed on the packaging.  You are prescribed prednisone, a steroid. This is a medication to help reduce inflammation in the nerves of the neck.  Common side effects include upset stomach/nausea. You may take this medicine with food if this occurs. Other side effects include restlessness, difficulty sleeping, and increased sweating. Call your healthcare provider if these do not resolve after finishing the medication.  This medicine may increase your blood sugar so additional careful monitoring is needed of blood sugar if you have diabetes. Call your healthcare provider for any signs/symtpoms of high blood sugar such as confusion, feeling sleepy, more thirst, more hunger, passing urine more often, flushing, fast breathing, or breath that smells like fruit.  You have been prescribed Norco for pain. This is an opioid pain medication. You may take this medication every 4-6 hours as needed for pain. Only take this medication if you need it for breakthrough pain.   Do not combine this medication with Tylenol, as it may increase the risk of liver problems.  Do not combine this medication with  alcohol.  Please be advised to avoid driving or operating heavy machinery while taking this medication, as it may make you drowsy or impair judgment.    Home care instructions:   Low back pain gets worse the longer you stay stationary. Please keep moving and walking as tolerated. There are exercises included in this packet to perform as tolerated for your low back pain.   Apply heat to the areas that are painful. Salon PAS patches are a great topical product.   Please follow any educational materials contained in this packet.   Follow-up instructions: Please follow-up with your primary care provider within the next 5 days for ED follow up for further evaluation of your symptoms if they are not completely improved.   Please follow up with neurosurgery since this may be a problem that may eventually require surgery.   Return instructions:  Please return to the Emergency Department if you experience worsening symptoms.  Please return for any fever or chills in the setting of your back pain, weakness in the muscles of the legs, numbness in your legs and feet that is new or changing, numbness in the area where you wipe, retention of your urine, loss of bowel or bladder control, or problems with walking. Please return if you have any other emergent concerns.  Additional Information:   Your vital signs today were: BP (!) 164/84    Pulse 84    Temp 98.6 F (37 C) (Oral)    Resp 16    Ht 5\' 1"  (1.549 m)    Wt 63.5 kg  SpO2 93%    BMI 26.45 kg/m  If your blood pressure (BP) was elevated on multiple readings during this visit above 130 for the top number or above 80 for the bottom number, please have this repeated by your primary care provider within one month. --------------  Thank you for allowing [place] to participate in your care today.

## 2019-03-18 NOTE — ED Notes (Signed)
Pt denies blood draw, states "I have been stuck 4 times already"

## 2019-03-18 NOTE — ED Notes (Signed)
PA aware pt has refused repeat lab draw.

## 2019-03-18 NOTE — ED Notes (Signed)
Patient verbalizes understanding of discharge instructions. Opportunity for questioning and answers were provided. Armband removed by staff, pt discharged from ED.  

## 2019-03-18 NOTE — ED Notes (Signed)
Lab states the BMP is hemolyzed.

## 2019-03-18 NOTE — ED Provider Notes (Signed)
[facility] Provider Note   CSN: [id number] Arrival date & time: 03/18/19  9371    History   Chief Complaint Chief Complaint  Patient presents with  . Neck Pain    HPI Darlene Griffith is a 68 y.o. female.     HPI   This is a 68 year old female with past medical history of hypertension, pulmonary nodules, multinodular goiter, presenting for neck pain.  Patient reports that over the past 48 hours she began having a "twinge" in her right neck.  She thought she slept on it wrong.  Patient reports that yesterday around 8:52 PM she experienced a sudden onset right-sided neck pain that has made her afraid to turn her neck.  She reports anytime she moves her neck she feels a sharp radiation down the right side of her arm and leg.  She denies any fevers, chills, cancer history, IVDU history.  She denies any weakness or numbness of upper or lower extremities.  Patient denies any chest pain, shortness of breath, nausea, or vomiting.  No saddle anesthesia or loss of bowel or bladder control.  She took ibuprofen last night for her symptoms.    Past Medical History:  Diagnosis Date  . Chest pain    a. 01/2015 Echo: Nl LV fxn, Gr 1 DD, triv AI, mild MR.  . Essential hypertension   . Hepatic cyst    a. noted on CT 01/2015.  . Multinodular goiter    a. 01/2015 CT chest: multinodular goidter w/ substernal extension of the left lobe of the thyroid assoc w/ rightward deviation of tracheal air column.  . Pulmonary nodules    a. 01/2015 CT Chest: RLL ~ 91mm subpleural nodule - rec f/u in 6-12 mos.  [name] Splenic cyst    a. noted on CT 01/2015.    Patient Active Problem List   Diagnosis Date Noted  . Insomnia 07/27/2018  . Multinodular goiter 07/14/2018  . Chronic fatigue 06/15/2018  . Midsternal chest pain 02/18/2015  . Chest pain   . Cough   . Essential hypertension   . Shortness of breath   . Aortic stenosis   . Pain in the chest   . EKG abnormality   .  Malaise and fatigue   . Lung nodule   . Splenic cyst   . Hepatic cyst   . Hyperlipidemia   . Hypertension 02/16/2015    Past Surgical History:  Procedure Laterality Date  . CESAREAN SECTION       OB History   No obstetric history on file.      Home Medications    Prior to Admission medications   Medication Sig Start Date End Date Taking? Authorizing Provider  aspirin-acetaminophen-caffeine (EXCEDRIN MIGRAINE) [phone] MG tablet Take 1 tablet by mouth every 6 (six) hours as needed for headache.    [provider]  Aspirin-Acetaminophen-Caffeine (GOODYS EXTRA STRENGTH) [phone] MG PACK Take 1 packet by mouth every 8 (eight) hours as needed (for pain. Takes at least 1-2 per day).     [provider]  atorvastatin (LIPITOR) 10 MG tablet Take 1 tablet (10 mg total) by mouth daily. 07/02/18   Martinique, Betty G, MD  gabapentin (NEURONTIN) 100 MG capsule Take 3 capsules (300 mg total) by mouth 3 (three) times daily. 07/27/18   [place], Betty G, MD  losartan (COZAAR) 25 MG tablet Take 1 tablet (25 mg total) by mouth daily. 02/04/19   Martin[place], Betty G, MD  naproxen (NAPROSYN) 375 MG  tablet Take 1 tablet (375 mg total) by mouth 2 (two) times daily. Patient taking differently: Take 375 mg by mouth 2 (two) times daily as needed for mild pain.  06/23/18   Vanessa Kick, MD  vitamin B-12 (CYANOCOBALAMIN) 100 MCG tablet Take 100 mcg by mouth daily.    [provider]    Family History Family History  Problem Relation Age of Onset  . Heart attack Maternal Grandmother        deceased  . High blood pressure Mother   . High Cholesterol Mother   . Thyroid disease Mother   . Thyroid disease Sister     Social History Social History   Tobacco Use  . Smoking status: Former Research scientist (life sciences)  . Smokeless tobacco: Never Used  Substance Use Topics  . Alcohol use: No    Alcohol/week: 0.0 standard drinks  . Drug use: No     Allergies   Patient has no known allergies.    Review of Systems Review of Systems  Constitutional: Negative for chills and fever.  HENT: Negative for congestion and sore throat.   Eyes: Negative for visual disturbance.  Respiratory: Negative for cough, chest tightness and shortness of breath.   Cardiovascular: Negative for chest pain.  Gastrointestinal: Negative for abdominal pain, nausea and vomiting.  Genitourinary: Negative for dysuria and flank pain.  Musculoskeletal: Positive for neck pain and neck stiffness. Negative for back pain and myalgias.  Skin: Negative for rash.  Neurological: Negative for dizziness, syncope, weakness, light-headedness, numbness and headaches.     Physical Exam Updated Vital Signs BP (!) 164/84   Pulse 84   Temp 98.6 F (37 C) (Oral)   Resp 16   Ht 5\' 1"  (1.549 m)   Wt 63.5 kg   SpO2 93%   BMI 26.45 kg/m   Physical Exam Vitals signs and nursing note reviewed.  Constitutional:      General: She is not in acute distress.    Appearance: She is well-developed.  HENT:     Head: Normocephalic and atraumatic.  Eyes:     Extraocular Movements: Extraocular movements intact.     Conjunctiva/sclera: Conjunctivae normal.     Pupils: Pupils are equal, round, and reactive to light.  Neck:     Musculoskeletal: Normal range of motion and neck supple. No neck rigidity.     Comments: No nuchal rigidity. No meningismus.  Cardiovascular:     Rate and Rhythm: Normal rate and regular rhythm.     Heart sounds: S1 normal and S2 normal. No murmur.  Pulmonary:     Effort: Pulmonary effort is normal.     Breath sounds: Normal breath sounds. No wheezing or rales.  Abdominal:     General: There is no distension.  Musculoskeletal: Normal range of motion.        General: No deformity.     Right lower leg: No edema.     Left lower leg: No edema.     Comments: PALPATION: No midline but paraspinal musculature tenderness of cervical and thoracic spine on the right.  ROM of cervical spine intact with  flexion/extension/lateral flexion/lateral rotation; Patient can laterally rotate cervical spine greater than 45 degrees. Limitations in movement due to pain. MOTOR: 5/5 strength b/l with resisted shoulder abduction/adduction, biceps flexion (C5/6), biceps extension (C6-C8), wrist flexion, wrist extension (C6-C8), and grip strength (C7-T1) 2+ DTRs in the biceps and triceps SENSORY: Sensation is intact to sharp and dull touch in:  Superficial radial nerve distribution (dorsal first web  space) Median nerve distribution (tip of index finger)   Ulnar nerve distribution (tip of small finger)  Patient moves LEs symmetrically and with good coordination. Patient ambulates symmetrically with no evidence of LE weakness.   Lymphadenopathy:     Cervical: No cervical adenopathy.  Skin:    General: Skin is warm and dry.     Findings: No erythema or rash.  Neurological:     Mental Status: She is alert.     Comments: Cranial nerves grossly intact. Patient moves extremities symmetrically and with good coordination.  Psychiatric:        Behavior: Behavior normal.        Thought Content: Thought content normal.        Judgment: Judgment normal.      ED Treatments / Results  Labs (all labs ordered are listed, but only abnormal results are displayed) Labs Reviewed  CBC WITH DIFFERENTIAL/PLATELET - Abnormal; Notable for the following components:      Result Value   MCV 107.2 (*)    MCH 34.2 (*)    All other components within normal limits  BASIC METABOLIC PANEL  CBG MONITORING, ED    EKG None  Radiology Mr Cervical Spine Wo Contrast  Result Date: 03/18/2019 CLINICAL DATA:  Intense neck pain.  No radicular symptoms described. EXAM: MRI CERVICAL SPINE WITHOUT CONTRAST TECHNIQUE: Multiplanar, multisequence MR imaging of the cervical spine was performed. No intravenous contrast was administered. COMPARISON:  None. FINDINGS: Alignment: Normal except for 1 mm of anterolisthesis at C7-T1. Vertebrae:  No fracture or significant bone lesion. Benign appearing hemangioma within the T3 vertebral body. Cord: No primary cord lesion.  See below regarding stenosis. Posterior Fossa, vertebral arteries, paraspinal tissues: Negative Disc levels: No abnormality at the foramen magnum or C1-2. C2-3: No disc abnormality. Mild facet osteoarthritis. No canal or foraminal stenosis. C3-4: Mild bulging of the disc with uncovertebral prominence. No compressive canal stenosis. Mild right foraminal narrowing, not likely compressive. C4-5: Minimal disc bulge and uncovertebral prominence.  No stenosis. C5-6: Endplate osteophytes and bulging of the disc. Narrowing of the ventral subarachnoid space but no compression of the cord. AP diameter of the canal 1 cm. Bony left foraminal narrowing could possibly affect the left C6 nerve. C6-7: Broad-based disc herniation with upward migration of disc material behind C6. Effacement of the ventral subarachnoid space. AP diameter of the canal only 8 mm. No abnormal cord signal. Mild bilateral bony foraminal narrowing. This could certainly be a cause of neck pain. C7-T1: Facet osteoarthritis on the left with mild edema. 1 mm of anterolisthesis. No canal or foraminal stenosis. The facet arthropathy could be painful. IMPRESSION: Broad-based disc herniation at C6-7 with upward migration of disc material behind C6. Narrowing of the canal with AP diameter only 8 mm. No frank cord compression. No significant foraminal stenosis at this level. This could certainly be a cause of neck pain. C7-T1: Left-sided facet osteoarthritis with some edema. This could be a cause of left lower neck pain. C5-6: Spondylosis. Left-sided bony foraminal narrowing could possibly affect the left C6 nerve. Lesser, non-compressive degenerative changes above that. Electronically Signed   By: Nelson Chimes M.D.   On: 03/18/2019 15:02    Procedures Procedures (including critical care time)  Medications Ordered in ED Medications   HYDROcodone-acetaminophen (NORCO/VICODIN) 5-325 MG per tablet 1 tablet (has no administration in time range)  morphine 4 MG/ML injection 4 mg (4 mg Intravenous Given 03/18/19 1238)  diazepam (VALIUM) tablet 5 mg (5 mg Oral Given  03/18/19 1225)     Initial Impression / Assessment and Plan / ED Course  I have reviewed the triage vital signs and the nursing notes.  Pertinent labs & imaging results that were available during my care of the patient were reviewed by me and considered in my medical decision making (see chart for details).  Clinical Course as of Mar 17 1654  Fri Mar 18, 2019  1558 Patient deferred recollection. Will check CBG.   Basic metabolic panel [AM]  2248 Glucose-Capillary: 90 [AM]    Clinical Course User Index [AM] Albesa Seen, PA-C       This is a well-appearing 68 year old female past medical history of hypertension, lung nodules, hyperlipidemia presenting for right-sided neck pain.  She is nontoxic-appearing, afebrile, and in no acute distress at rest.  Differential diagnosis includes radiculopathy, cervical artery dissection, atraumatic fracture.  Low suspicion for ACS given the association of pain with movement and no chest pain or shortness of breath.  She has no nuchal rigidity or meningismus. Case discussed with attending physician who reviewed case and will proceed with MRI.   MRI of the cervical spine demonstrates broad disc herniation at C6-C7.  No cord compression.  Likely constipation symptoms.  Work-up demonstrating no leukocytosis.  Patient deferred BMP after hemolyzed.  Will check CBG prior to initiating steroids.  Patient will be given pain control, steroids, PCP and neurosurgery follow-up.  She is counseled on side effects of these medications. She is given return precautions for any increasing pain, fever, weakness or numbness in extremities, saddle anesthesia or loss of bowel or bladder control.  Patient is in understanding and agrees with plan of  care.  This is a shared visit with Dr. Charlesetta Shanks. Patient was independently evaluated by this attending physician. Attending physician consulted in evaluation and discharge management.  Final Clinical Impressions(s) / ED Diagnoses   Final diagnoses:  Neck pain  Cervical disc herniation    ED Discharge Orders         Ordered    HYDROcodone-acetaminophen (NORCO/VICODIN) 5-325 MG tablet  Every 6 hours PRN     03/18/19 1653    predniSONE (DELTASONE) 20 MG tablet  Daily with breakfast     03/18/19 1653           Albesa Seen, PA-C 03/18/19 1655    Charlesetta Shanks, MD 04/02/19 1428

## 2019-03-18 NOTE — ED Notes (Signed)
Pt would not let be draw labs. Attending RN talking to pt now regarding IV and same.

## 2019-03-22 ENCOUNTER — Ambulatory Visit (INDEPENDENT_AMBULATORY_CARE_PROVIDER_SITE_OTHER): Payer: Medicare Other | Admitting: Family Medicine

## 2019-03-22 ENCOUNTER — Other Ambulatory Visit: Payer: Self-pay

## 2019-03-22 ENCOUNTER — Encounter: Payer: Self-pay | Admitting: Family Medicine

## 2019-03-22 VITALS — BP 160/110 | HR 88

## 2019-03-22 DIAGNOSIS — M542 Cervicalgia: Secondary | ICD-10-CM | POA: Diagnosis not present

## 2019-03-22 DIAGNOSIS — I10 Essential (primary) hypertension: Secondary | ICD-10-CM | POA: Diagnosis not present

## 2019-03-22 MED ORDER — AMLODIPINE BESYLATE 5 MG PO TABS: 2.5000 mg | ORAL_TABLET | Freq: Every day | ORAL | 1 refills | Status: DC

## 2019-03-22 MED ORDER — HYDROCODONE-ACETAMINOPHEN 5-325 MG PO TABS: 1.0000 | ORAL_TABLET | Freq: Four times a day (QID) | ORAL | 0 refills | Status: AC | PRN

## 2019-03-22 MED ORDER — LOSARTAN POTASSIUM 50 MG PO TABS: 50.0000 mg | ORAL_TABLET | Freq: Every day | ORAL | 3 refills | Status: DC

## 2019-03-22 MED ORDER — TIZANIDINE HCL 4 MG PO TABS: 4.0000 mg | ORAL_TABLET | Freq: Three times a day (TID) | ORAL | 0 refills | Status: DC | PRN

## 2019-03-22 NOTE — Progress Notes (Signed)
Virtual Visit via Video Note   I connected with@on  03/22/19 at  9:00 AM EDT by a video enabled telemedicine application and verified that I am speaking with the correct person using two identifiers.  Location patient: home Location provider:work or home office Persons participating in the virtual visit: patient, provider  I discussed the limitations of evaluation and management by telemedicine and the availability of in person appointments. The patient expressed understanding and agreed to proceed.   HPI: [name] is a 68 years old female with history of hypertension, chronic fatigue, hyperlipidemia, and thyroid nodule among some who is complaining about severe neck pain. Woke up with pain on 03/16/19,gradual,initially she thought she slept on it "wrong."  She was evaluated in the ER on 03/18/2019 because worsening pain,radiated to RUE.  Cervical CT was done:  Broad-based disc herniation at C6-7 with upward migration of disc material behind C6. Narrowing of the canal with AP diameter only 8 mm. No frank cord compression. No significant foraminal stenosis at this level. This could certainly be a cause of neck pain. C7-T1: Left-sided facet osteoarthritis with some edema. This could be a cause of left lower neck pain. C5-6: Spondylosis. Left-sided bony foraminal narrowing could possibly affect the left C6 nerve. Lesser, non-compressive degenerative changes above that.  She was discharged on hydrocodone-acetaminophen 5-325 mg 4 times daily prednisone 40 mg daily x5 days.  She states that medication "is not strong enough" to manage pain, she has taking 2 tablets at the time. Pain is constant, radiated to right occipital area. "Slightly better today", still "unbearable pain." Limitation of ROM due to pain. No Hx of trauma.  Exacerbated by "slightly movement" and alleviated by rest. Pain interferes with his sleep.  Negative for extremity numbness, tingling, burning, or weakness.  She  denies associated fever, chills, unusual fatigue, edema or erythema on affected area.  BP in the ER 164/84. She has history of hypertension, BP readings at home 180/120 and 180/110. Currently she is on losartan 25 mg daily. She denies visual changes, chest pain, dyspnea, palpitation, gross hematuria, decreased urine output, focal deficit, or edema.   Lab Results  Component Value Date   CREATININE 0.40 (L) 08/16/2018   BUN 8 08/16/2018   NA 141 08/16/2018   K 3.7 08/16/2018   CL 106 08/16/2018   CO2 24 08/16/2018    ROS: See pertinent positives and negatives per HPI.  Past Medical History:  Diagnosis Date  . Chest pain    a. 01/2015 Echo: Nl LV fxn, Gr 1 DD, triv AI, mild MR.  . Essential hypertension   . Hepatic cyst    a. noted on CT 01/2015.  . Multinodular goiter    a. 01/2015 CT chest: multinodular goidter w/ substernal extension of the left lobe of the thyroid assoc w/ rightward deviation of tracheal air column.  . Pulmonary nodules    a. 01/2015 CT Chest: RLL ~ 72mm subpleural nodule - rec f/u in 6-12 mos.  [name] Splenic cyst    a. noted on CT 01/2015.    Past Surgical History:  Procedure Laterality Date  . CESAREAN SECTION      Family History  Problem Relation Age of Onset  . Heart attack Maternal Grandmother        deceased  . High blood pressure Mother   . High Cholesterol Mother   . Thyroid disease Mother   . Thyroid disease Sister     Social History   Socioeconomic History  . Marital status:  Single    Spouse name: Not on file  . Number of children: Not on file  . Years of education: Not on file  . Highest education level: Not on file  Occupational History  . Not on file  Social Needs  . Financial resource strain: Not on file  . Food insecurity:    Worry: Not on file    Inability: Not on file  . Transportation needs:    Medical: Not on file    Non-medical: Not on file  Tobacco Use  . Smoking status: Former Research scientist (life sciences)  . Smokeless tobacco: Never Used   Substance and Sexual Activity  . Alcohol use: No    Alcohol/week: 0.0 standard drinks  . Drug use: No  . Sexual activity: Not on file  Lifestyle  . Physical activity:    Days per week: Not on file    Minutes per session: Not on file  . Stress: Not on file  Relationships  . Social connections:    Talks on phone: Not on file    Gets together: Not on file    Attends religious service: Not on file    Active member of club or organization: Not on file    Attends meetings of clubs or organizations: Not on file    Relationship status: Not on file  . Intimate partner violence:    Fear of current or ex partner: Not on file    Emotionally abused: Not on file    Physically abused: Not on file    Forced sexual activity: Not on file  Other Topics Concern  . Not on file  Social History Narrative  . Not on file    Current Outpatient Medications:  .  amLODipine (NORVASC) 5 MG tablet, Take 0.5 tablets (2.5 mg total) by mouth daily., Disp: 30 tablet, Rfl: 1 .  aspirin-acetaminophen-caffeine (EXCEDRIN MIGRAINE) 250-250-65 MG tablet, Take 1 tablet by mouth every 6 (six) hours as needed for headache., Disp: , Rfl:  .  Aspirin-Acetaminophen-Caffeine (GOODYS EXTRA STRENGTH) 500-325-65 MG PACK, Take 1 packet by mouth every 8 (eight) hours as needed (for pain. Takes at least 1-2 per day). , Disp: , Rfl:  .  atorvastatin (LIPITOR) 10 MG tablet, Take 1 tablet (10 mg total) by mouth daily., Disp: 90 tablet, Rfl: 3 .  HYDROcodone-acetaminophen (NORCO/VICODIN) 5-325 MG tablet, Take 1 tablet by mouth every 6 (six) hours as needed for up to 5 days for moderate pain., Disp: 20 tablet, Rfl: 0 .  losartan (COZAAR) 50 MG tablet, Take 1 tablet (50 mg total) by mouth daily., Disp: 90 tablet, Rfl: 3 .  naproxen (NAPROSYN) 375 MG tablet, Take 1 tablet (375 mg total) by mouth 2 (two) times daily. (Patient taking differently: Take 375 mg by mouth 2 (two) times daily as needed for mild pain. ), Disp: 20 tablet, Rfl: 0 .   predniSONE (DELTASONE) 20 MG tablet, Take 2 tablets (40 mg total) by mouth daily with breakfast for 5 days., Disp: 10 tablet, Rfl: 0 .  tiZANidine (ZANAFLEX) 4 MG tablet, Take 1 tablet (4 mg total) by mouth every 8 (eight) hours as needed for muscle spasms., Disp: 30 tablet, Rfl: 0 .  vitamin B-12 (CYANOCOBALAMIN) 100 MCG tablet, Take 100 mcg by mouth daily., Disp: , Rfl:   EXAM:  VITALS per patient if applicable:BP (!) 161/096   Pulse 88   GENERAL: alert, oriented, appears well and in mild distress due to pain.  HEENT: atraumatic, conjunctiva clear, no obvious facial abnormalities on  inspection.  NECK: Limitation of neck movement, normocephalic.  LUNGS: on inspection no signs of respiratory distress, breathing rate appears normal, no obvious gross SOB, gasping or wheezing  CV: no obvious cyanosis  MS: moves all visible extremities without noticeable abnormality  PSYCH/NEURO: pleasant and cooperative, no obvious depression, labile and anxious.No focal deficit appreciated.  ASSESSMENT AND PLAN:  Discussed the following assessment and plan:  Neck pain on right side - Plan: HYDROcodone-acetaminophen (NORCO/VICODIN) 5-325 MG tablet, tiZANidine (ZANAFLEX) 4 MG tablet Cervical CT negative for acute process, moderate to severe degenerative changes. Reporting some improvement. For now we will hold on referral to orthopedist, recommend completing prednisone. She would like another prescription for hydrocodone-acetaminophen, 5 days supply given. We discussed side effects of muscle relaxants, opioids, and prednisone. Range of motion exercises, as tolerated, recommended. Local massage and ice/heat. Instructed on warning signs. Follow-up in 2 weeks.  Essential hypertension Not well controlled. Possible complications of elevated BP discussed. Amlodipine 2.5 mg daily added. Losartan increased from 25 mg to 50 mg daily. Clearly instructed about warning signs. Low-salt diet  recommended. Instructed to monitor BP daily. Follow-up in 2 weeks, before if needed.      I discussed the assessment and treatment plan with the patient.  She was provided an opportunity to ask questions and all were answered. She agreed with the plan and demonstrated an understanding of the instructions.   The patient was advised to call back or seek an in-person evaluation if the symptoms worsen or if the condition fails to improve as anticipated.  Return in about 2 weeks (around 04/05/2019).    Lidya Mccalister [place], MD

## 2019-03-22 NOTE — Assessment & Plan Note (Signed)
Not well controlled. Possible complications of elevated BP discussed. Amlodipine 2.5 mg daily added. Losartan increased from 25 mg to 50 mg daily. Clearly instructed about warning signs. Low-salt diet recommended. Instructed to monitor BP daily. Follow-up in 2 weeks, before if needed.

## 2019-03-23 DIAGNOSIS — M5020 Other cervical disc displacement, unspecified cervical region: Secondary | ICD-10-CM | POA: Diagnosis not present

## 2019-05-20 ENCOUNTER — Other Ambulatory Visit: Payer: Self-pay

## 2019-05-21 ENCOUNTER — Other Ambulatory Visit: Payer: Self-pay

## 2019-05-21 ENCOUNTER — Encounter (HOSPITAL_COMMUNITY): Payer: Self-pay | Admitting: Internal Medicine

## 2019-05-21 ENCOUNTER — Observation Stay (HOSPITAL_COMMUNITY)
Admission: EM | Admit: 2019-05-21 | Discharge: 2019-05-22 | Disposition: A | Discharge status: Home / Self Care | Payer: Medicare Other | Attending: Internal Medicine | Admitting: Internal Medicine

## 2019-05-21 ENCOUNTER — Emergency Department (HOSPITAL_COMMUNITY): Payer: Medicare Other

## 2019-05-21 DIAGNOSIS — K7689 Other specified diseases of liver: Secondary | ICD-10-CM | POA: Diagnosis not present

## 2019-05-21 DIAGNOSIS — R072 Precordial pain: Secondary | ICD-10-CM

## 2019-05-21 DIAGNOSIS — F17210 Nicotine dependence, cigarettes, uncomplicated: Secondary | ICD-10-CM | POA: Insufficient documentation

## 2019-05-21 DIAGNOSIS — I10 Essential (primary) hypertension: Secondary | ICD-10-CM | POA: Diagnosis not present

## 2019-05-21 DIAGNOSIS — E0520 Thyrotoxicosis with toxic multinodular goiter without thyrotoxic crisis or storm: Secondary | ICD-10-CM | POA: Diagnosis not present

## 2019-05-21 DIAGNOSIS — M542 Cervicalgia: Secondary | ICD-10-CM | POA: Diagnosis not present

## 2019-05-21 DIAGNOSIS — G8929 Other chronic pain: Secondary | ICD-10-CM | POA: Diagnosis not present

## 2019-05-21 DIAGNOSIS — E785 Hyperlipidemia, unspecified: Secondary | ICD-10-CM | POA: Diagnosis not present

## 2019-05-21 DIAGNOSIS — Z1159 Encounter for screening for other viral diseases: Secondary | ICD-10-CM | POA: Diagnosis not present

## 2019-05-21 DIAGNOSIS — Z791 Long term (current) use of non-steroidal anti-inflammatories (NSAID): Secondary | ICD-10-CM | POA: Diagnosis not present

## 2019-05-21 DIAGNOSIS — Z79899 Other long term (current) drug therapy: Secondary | ICD-10-CM | POA: Diagnosis not present

## 2019-05-21 DIAGNOSIS — R079 Chest pain, unspecified: Secondary | ICD-10-CM | POA: Diagnosis not present

## 2019-05-21 DIAGNOSIS — I249 Acute ischemic heart disease, unspecified: Secondary | ICD-10-CM | POA: Diagnosis not present

## 2019-05-21 DIAGNOSIS — Z20828 Contact with and (suspected) exposure to other viral communicable diseases: Secondary | ICD-10-CM | POA: Diagnosis not present

## 2019-05-21 DIAGNOSIS — E042 Nontoxic multinodular goiter: Secondary | ICD-10-CM | POA: Diagnosis not present

## 2019-05-21 DIAGNOSIS — Z8249 Family history of ischemic heart disease and other diseases of the circulatory system: Secondary | ICD-10-CM | POA: Diagnosis not present

## 2019-05-21 DIAGNOSIS — E7800 Pure hypercholesterolemia, unspecified: Secondary | ICD-10-CM

## 2019-05-21 HISTORY — DX: Other chronic pain: G89.29

## 2019-05-21 LAB — LIPID PANEL
Cholesterol: 217 mg/dL — ABNORMAL HIGH (ref 0–200)
HDL: 46 mg/dL (ref >40)
LDL Cholesterol: 133 mg/dL — ABNORMAL HIGH (ref 0–99)
Total CHOL/HDL Ratio: 4.7 RATIO
Triglycerides: 190 mg/dL — ABNORMAL HIGH (ref <150)
VLDL: 38 mg/dL (ref 0–40)

## 2019-05-21 LAB — SARS CORONAVIRUS 2 BY RT PCR (HOSPITAL ORDER, PERFORMED IN ~~LOC~~ HOSPITAL LAB): SARS Coronavirus 2: NEGATIVE

## 2019-05-21 LAB — CBC
HCT: 41 % (ref 36.0–46.0)
Hemoglobin: 13.3 g/dL (ref 12.0–15.0)
MCH: 34.5 pg — ABNORMAL HIGH (ref 26.0–34.0)
MCHC: 32.4 g/dL (ref 30.0–36.0)
MCV: 106.2 fL — ABNORMAL HIGH (ref 80.0–100.0)
Platelets: 246 10*3/uL (ref 150–400)
RBC: 3.86 MIL/uL — ABNORMAL LOW (ref 3.87–5.11)
RDW: 11.6 % (ref 11.5–15.5)
WBC: 9.3 10*3/uL (ref 4.0–10.5)
nRBC: 0 % (ref 0.0–0.2)

## 2019-05-21 LAB — BASIC METABOLIC PANEL
Anion gap: 6 (ref 5–15)
BUN: 8 mg/dL (ref 8–23)
CO2: 25 mmol/L (ref 22–32)
Calcium: 9.2 mg/dL (ref 8.9–10.3)
Chloride: 109 mmol/L (ref 98–111)
Creatinine, Ser: 0.53 mg/dL (ref 0.44–1.00)
GFR calc Af Amer: >60 mL/min (ref >60)
GFR calc non Af Amer: >60 mL/min (ref >60)
Glucose, Bld: 92 mg/dL (ref 70–99)
Potassium: 3.8 mmol/L (ref 3.5–5.1)
Sodium: 140 mmol/L (ref 135–145)

## 2019-05-21 LAB — TSH: TSH: 0.092 u[IU]/mL — ABNORMAL LOW (ref 0.350–4.500)

## 2019-05-21 LAB — HEMOGLOBIN A1C
Hgb A1c MFr Bld: 5 % (ref 4.8–5.6)
Mean Plasma Glucose: 96.8 mg/dL

## 2019-05-21 LAB — TROPONIN I (HIGH SENSITIVITY)
Troponin I (High Sensitivity): 9 ng/L (ref <18)
Troponin I (High Sensitivity): 14 ng/L (ref <18)

## 2019-05-21 MED ORDER — ENOXAPARIN SODIUM 40 MG/0.4ML ~~LOC~~ SOLN
40.0000 mg | SUBCUTANEOUS | Status: DC
  Administered 2019-05-21: 40 mg via SUBCUTANEOUS
  Filled 2019-05-21: qty 0.4

## 2019-05-21 MED ORDER — SODIUM CHLORIDE 0.9% FLUSH
3.0000 mL | Freq: Once | INTRAVENOUS | Status: AC
  Administered 2019-05-21: 3 mL via INTRAVENOUS

## 2019-05-21 MED ORDER — ONDANSETRON HCL 4 MG/2ML IJ SOLN: 4.0000 mg | Freq: Four times a day (QID) | INTRAMUSCULAR | Status: DC | PRN

## 2019-05-21 MED ORDER — LOSARTAN POTASSIUM 50 MG PO TABS
50.0000 mg | ORAL_TABLET | Freq: Every day | ORAL | Status: DC
  Administered 2019-05-21 – 2019-05-22 (×2): 50 mg via ORAL
  Filled 2019-05-21 (×2): qty 1

## 2019-05-21 MED ORDER — TIZANIDINE HCL 4 MG PO TABS: 4.0000 mg | ORAL_TABLET | Freq: Three times a day (TID) | ORAL | Status: DC | PRN

## 2019-05-21 MED ORDER — NITROGLYCERIN 0.4 MG SL SUBL
0.4000 mg | SUBLINGUAL_TABLET | SUBLINGUAL | Status: DC | PRN
  Administered 2019-05-21: 0.4 mg via SUBLINGUAL
  Filled 2019-05-21: qty 1

## 2019-05-21 MED ORDER — ASPIRIN 81 MG PO CHEW
324.0000 mg | CHEWABLE_TABLET | Freq: Once | ORAL | Status: AC
  Administered 2019-05-21: 12:00:00 324 mg via ORAL
  Filled 2019-05-21: qty 4

## 2019-05-21 MED ORDER — ACETAMINOPHEN 325 MG PO TABS
650.0000 mg | ORAL_TABLET | ORAL | Status: DC | PRN
  Administered 2019-05-21: 650 mg via ORAL
  Filled 2019-05-21: qty 2

## 2019-05-21 MED ORDER — AMLODIPINE BESYLATE 2.5 MG PO TABS
2.5000 mg | ORAL_TABLET | Freq: Every day | ORAL | Status: DC
  Administered 2019-05-21 – 2019-05-22 (×2): 2.5 mg via ORAL
  Filled 2019-05-21 (×2): qty 1

## 2019-05-21 NOTE — Plan of Care (Signed)
  Problem: Education: Goal: Knowledge of General Education information will improve Description Including pain rating scale, medication(s)/side effects and non-pharmacologic comfort measures Outcome: Progressing   

## 2019-05-21 NOTE — ED Notes (Signed)
ED TO INPATIENT HANDOFF REPORT  ED Nurse Name and Phone #: William Hamburger, RN  S Name/Age/Gender Darlene Griffith 68 y.o. female Room/Bed: 021C/021C  Code Status   Code Status: Full Code  Home/SNF/Other Home Patient oriented to: situation Is this baseline? No   Triage Complete: Triage complete  Chief Complaint chest pain  Triage Note Patient complains of CP with bilateral arm pain since early am/ patient reports that her chest feels tight. Patient alert and oriented   Allergies No Known Allergies  Level of Care/Admitting Diagnosis ED Disposition    ED Disposition Condition Comment   Admit  Hospital Area: [facility] [[id number]]  Level of Care: Telemetry Medical [104]  I expect the patient will be discharged within 24 hours: Yes  LOW acuity---Tx typically complete <24 hrs---ACUTE conditions typically can be evaluated <24 hours---LABS likely to return to acceptable levels <24 hours---IS near functional baseline---EXPECTED to return to current living arrangement---NOT newly hypoxic: Meets criteria for 5C-Observation unit  Covid Evaluation: Confirmed COVID Negative  Diagnosis: Chest pain [211941]  Admitting Physician: Karmen Bongo [[id number]]  Attending Physician: Karmen Bongo [[id number]]  PT Class (Do Not Modify): Observation [104]  PT Acc Code (Do Not Modify): Observation [10022]       B Medical/Surgery History Past Medical History:  Diagnosis Date  . Chest pain    a. 01/2015 Echo: Nl LV fxn, Gr 1 DD, triv AI, mild MR.  . Essential hypertension   . Hepatic cyst    a. noted on CT 01/2015.  . Multinodular goiter    a. 01/2015 CT chest: multinodular goidter w/ substernal extension of the left lobe of the thyroid assoc w/ rightward deviation of tracheal air column.  . Neck pain, chronic   . Pulmonary nodules    a. 01/2015 CT Chest: RLL ~ 32mm subpleural nodule - rec f/u in 6-12 mos.  [name] Splenic cyst    a. noted on CT 01/2015.   Past Surgical History:  Procedure  Laterality Date  . CESAREAN SECTION       A IV Location/Drains/Wounds Patient Lines/Drains/Airways Status   Active Line/Drains/Airways    Name:   Placement date:   Placement time:   Site:   Days:   Peripheral IV 03/18/19 Left Hand   03/18/19    1233    Hand   64   Peripheral IV 05/21/19 Left;Anterior Forearm   05/21/19    1543    Forearm   less than 1          Intake/Output Last 24 hours No intake or output data in the 24 hours ending 05/21/19 1544  Labs/Imaging Results for orders placed or performed during the hospital encounter of 05/21/19 (from the past 48 hour(s))  Basic metabolic panel     Status: None   Collection Time: 05/21/19 11:41 AM  Result Value Ref Range   Sodium 140 135 - 145 mmol/L   Potassium 3.8 3.5 - 5.1 mmol/L   Chloride 109 98 - 111 mmol/L   CO2 25 22 - 32 mmol/L   Glucose, Bld 92 70 - 99 mg/dL   BUN 8 8 - 23 mg/dL   Creatinine, Ser 0.53 0.44 - 1.00 mg/dL   Calcium 9.2 8.9 - 10.3 mg/dL   GFR calc non Af Amer >60 >60 mL/min   GFR calc Af Amer >60 >60 mL/min   Anion gap 6 5 - 15    Comment: Performed at [facility], [place] [address], [place], [place] [address]  CBC     Status: Abnormal   Collection Time: 05/21/19 11:41 AM  Result Value Ref Range   WBC 9.3 4.0 - 10.5 K/uL   RBC 3.86 (L) 3.87 - 5.11 MIL/uL   Hemoglobin 13.3 12.0 - 15.0 g/dL   HCT 41.0 36.0 - 46.0 %   MCV 106.2 (H) 80.0 - 100.0 fL   MCH 34.5 (H) 26.0 - 34.0 pg   MCHC 32.4 30.0 - 36.0 g/dL   RDW 11.6 11.5 - 15.5 %   Platelets 246 150 - 400 K/uL   nRBC 0.0 0.0 - 0.2 %    Comment: Performed at [facility] [address] [place], [place] [address]  Troponin I (High Sensitivity)     Status: None   Collection Time: 05/21/19 11:41 AM  Result Value Ref Range   Troponin I (High Sensitivity) 9 <18 ng/L    Comment: (NOTE) Elevated high sensitivity troponin I (hsTnI) values and significant  changes across serial measurements may suggest ACS but many other  chronic and acute  conditions are known to elevate hsTnI results.  Refer to the "Links" section for chest pain algorithms and additional  guidance. Performed at [facility], [place] [address] [place], [place] [address]   TSH     Status: Abnormal   Collection Time: 05/21/19 11:44 AM  Result Value Ref Range   TSH 0.092 (L) 0.350 - 4.500 uIU/mL    Comment: Performed by a 3rd Generation assay with a functional sensitivity of <=0.01 uIU/mL. Performed at [facility], [place] [address], [place], [place] [address]   SARS Coronavirus 2 [facility] order, Performed in [facility] hospital lab) Nasopharyngeal Nasopharyngeal Swab     Status: None   Collection Time: 05/21/19 12:36 PM   Specimen: Nasopharyngeal Swab  Result Value Ref Range   SARS Coronavirus 2 NEGATIVE NEGATIVE    Comment: (NOTE) If result is NEGATIVE SARS-CoV-2 target nucleic acids are NOT DETECTED. The SARS-CoV-2 RNA is generally detectable in upper and lower  respiratory specimens during the acute phase of infection. The lowest  concentration of SARS-CoV-2 viral copies this assay can detect is 250  copies / mL. A negative result does not preclude SARS-CoV-2 infection  and should not be used as the sole basis for treatment or other  patient management decisions.  A negative result may occur with  improper specimen collection / handling, submission of specimen other  than nasopharyngeal swab, presence of viral mutation(s) within the  areas targeted by this assay, and inadequate number of viral copies  (<250 copies / mL). A negative result must be combined with clinical  observations, patient history, and epidemiological information. If result is POSITIVE SARS-CoV-2 target nucleic acids are DETECTED. The SARS-CoV-2 RNA is generally detectable in upper and lower  respiratory specimens dur ing the acute phase of infection.  Positive  results are indicative of active infection with SARS-CoV-2.  Clinical  correlation with patient history and  other diagnostic information is  necessary to determine patient infection status.  Positive results do  not rule out bacterial infection or co-infection with other viruses. If result is PRESUMPTIVE POSTIVE SARS-CoV-2 nucleic acids MAY BE PRESENT.   A presumptive positive result was obtained on the submitted specimen  and confirmed on repeat testing.  While 2019 novel coronavirus  (SARS-CoV-2) nucleic acids may be present in the submitted sample  additional confirmatory testing may be necessary for epidemiological  and / or clinical management purposes  to differentiate between  SARS-CoV-2 and other Sarbecovirus currently known to infect humans.  If clinically indicated additional testing with an alternate test  methodology [phone]) is advised. The SARS-CoV-2 RNA is generally  detectable in upper and lower respiratory sp ecimens during the acute  phase of infection. The expected result is Negative. Fact Sheet for Patients:  StrictlyIdeas.no Fact Sheet for Healthcare Providers: BankingDealers.co.za This test is not yet approved or cleared by the [place] FDA and has been authorized for detection and/or diagnosis of SARS-CoV-2 by FDA under an Emergency Use Authorization (EUA).  This EUA will remain in effect (meaning this test can be used) for the duration of the COVID-19 declaration under Section 564(b)(1) of the Act, 21 U.S.C. section 360bbb-3(b)(1), unless the authorization is terminated or revoked sooner. Performed at [facility], [place] [address], [place], [place] [address]   Troponin I (High Sensitivity)     Status: None   Collection Time: 05/21/19  1:40 PM  Result Value Ref Range   Troponin I (High Sensitivity) 14 <18 ng/L    Comment: (NOTE) Elevated high sensitivity troponin I (hsTnI) values and significant  changes across serial measurements may suggest ACS but many other  chronic and acute conditions are known to  elevate hsTnI results.  Refer to the "Links" section for chest pain algorithms and additional  guidance. Performed at [facility], [place] [address] [place], [place] [address]    Dg Chest Port 1 View  Result Date: 05/21/2019 CLINICAL DATA:  Central chest and bilateral arm pain today. EXAM: PORTABLE CHEST 1 VIEW COMPARISON:  CT chest 02/16/2017.  PA and lateral chest 02/15/2017. FINDINGS: Lungs are clear. Heart size is normal. No pneumothorax or pleural effusion. Thyromegaly results in deviation of the trachea to the right, unchanged. No acute or focal bony abnormality. IMPRESSION: No acute disease. Electronically Signed   By: Inge Rise M.D.   On: 05/21/2019 12:12    Pending Labs Unresulted Labs (From admission, onward)    Start     Ordered   05/21/19 1540  Lipid panel  Once,   STAT     05/21/19 1540   05/21/19 1539  HIV antibody (Routine Testing)  Once,   STAT     05/21/19 1540          Vitals/Pain Today's Vitals   05/21/19 1315 05/21/19 1330 05/21/19 1345 05/21/19 1445  BP: 112/67 (!) 136/96 137/80 (!) 159/93  Pulse: 77 73  88  Resp: 17 19 13 15   SpO2: 96% 97%  99%  PainSc:        Isolation Precautions No active isolations  Medications Medications  sodium chloride flush (NS) 0.9 % injection 3 mL (has no administration in time range)  nitroGLYCERIN (NITROSTAT) SL tablet 0.4 mg (0.4 mg Sublingual Given 05/21/19 1232)  amLODipine (NORVASC) tablet 2.5 mg (has no administration in time range)  losartan (COZAAR) tablet 50 mg (has no administration in time range)  tiZANidine (ZANAFLEX) tablet 4 mg (has no administration in time range)  enoxaparin (LOVENOX) injection 40 mg (has no administration in time range)  acetaminophen (TYLENOL) tablet 650 mg (has no administration in time range)  ondansetron (ZOFRAN) injection 4 mg (has no administration in time range)  aspirin chewable tablet 324 mg (324 mg Oral Given 05/21/19 1229)    Mobility walks     Focused  Assessments Cardiac Assessment Handoff:  Cardiac Rhythm: Normal sinus rhythm Lab Results  Component Value Date   CKTOTAL 55 09/29/2008   CKMB 0.7 09/29/2008  TROPONINI 0.03 02/17/2015   Lab Results  Component Value Date   DDIMER  09/29/2008    0.30        AT THE INHOUSE ESTABLISHED CUTOFF VALUE OF 0.48 ug/mL FEU, THIS ASSAY HAS BEEN DOCUMENTED IN THE LITERATURE TO HAVE   Does the Patient currently have chest pain? No     R Recommendations: See Admitting Provider Note  Report given to:   Additional Notes: IV team @ bedside [phone]

## 2019-05-21 NOTE — H&P (Signed)
History and Physical    Darlene Griffith YSA:[id number] DOB: [birth year] DOA: 05/21/2019  PCP: Martinique, Betty G, MD Consultants:  Loanne Drilling - endocrinology Patient coming from:  Home - lives with son or alone; NOK: Daughter, [place] Ar, [phone]  Chief Complaint: chest pain  HPI: Darlene Griffith is a 68 y.o. female with medical history significant of incidental findings on CT in 2016 (splenic and hepatic cysts, pulmonary nodules, multinodular goiter) and HTN presenting with chest pain.  Last Echo in 4/16 showed grade 1 diastolic dysfunction; she presented with chest pain at that time and was observed without stress test/cath.  She woke up this AM and was excited to spend the day with her grandson.  When she was walking out of the bathroom, both arms started throbbing and hurting.  It felt like someone was pulling the veins out - not a sharp pain.  It radiated to her hands.  She sat down and then noticed severe chest tightness in the substernal region.  She felt like she couldn't take a deep enough breath.  A month ago, they diagnosed a disc and nerve problem in her neck and she thought this could be related to that.  She had been prescribed steroids but only took it when the pain was unbearable and would otherwise take Tylenol Arthritis.   She had a sister who was a junkie and she was afraid to get addicted.  She had a scratchy throat for the last 3-4 days and she was nervous about COVID; she spends lots of time with her grandchildren and wouldn't want to give it to them.  She had been diagnosed with hyperthyroidism and COVID came in and she was supposed to go to [facility] for a second opinion and it hasn't happened yet.   She does feel like the NTG made it ease off.  She thinks the pain is completely gone now.   ED Course:   Chest pain evaluation.  +tobacco, HTN, HLD, untreated hyperthyroidism.  B UE paresthesias and pain with CP, tightness, SOB.  HEART score is 6.  Initial troponin negative but still high risk.     Review of Systems: As per HPI; otherwise review of systems reviewed and negative.   Ambulatory Status:  Ambulates without assistance  Past Medical History:  Diagnosis Date  . Chest pain    a. 01/2015 Echo: Nl LV fxn, Gr 1 DD, triv AI, mild MR.  . Essential hypertension   . Hepatic cyst    a. noted on CT 01/2015.  . Multinodular goiter    a. 01/2015 CT chest: multinodular goidter w/ substernal extension of the left lobe of the thyroid assoc w/ rightward deviation of tracheal air column.  . Neck pain, chronic   . Pulmonary nodules    a. 01/2015 CT Chest: RLL ~ 106mm subpleural nodule - rec f/u in 6-12 mos.  [name] Splenic cyst    a. noted on CT 01/2015.    Past Surgical History:  Procedure Laterality Date  . CESAREAN SECTION      Social History   Socioeconomic History  . Marital status: Single    Spouse name: Not on file  . Number of children: Not on file  . Years of education: Not on file  . Highest education level: Not on file  Occupational History  . Occupation: retired  Scientific laboratory technician  . Financial resource strain: Not on file  . Food insecurity    Worry: Not on file    Inability: Not on file  .  Transportation needs    Medical: Not on file    Non-medical: Not on file  Tobacco Use  . Smoking status: Former Research scientist (life sciences)  . Smokeless tobacco: Never Used  . Tobacco comment: " long time ago"  Substance and Sexual Activity  . Alcohol use: Yes    Alcohol/week: 0.0 standard drinks    Comment: rare  . Drug use: No  . Sexual activity: Not on file  Lifestyle  . Physical activity    Days per week: Not on file    Minutes per session: Not on file  . Stress: Not on file  Relationships  . Social Herbalist on phone: Not on file    Gets together: Not on file    Attends religious service: Not on file    Active member of club or organization: Not on file    Attends meetings of clubs or organizations: Not on file    Relationship status: Not on file  . Intimate partner  violence    Fear of current or ex partner: Not on file    Emotionally abused: Not on file    Physically abused: Not on file    Forced sexual activity: Not on file  Other Topics Concern  . Not on file  Social History Narrative  . Not on file    No Known Allergies  Family History  Problem Relation Age of Onset  . Heart attack Maternal Grandmother        deceased  . High blood pressure Mother   . High Cholesterol Mother   . Thyroid disease Mother   . Thyroid disease Sister     Prior to Admission medications   Medication Sig Start Date End Date Taking? Authorizing Provider  amLODipine (NORVASC) 5 MG tablet Take 0.5 tablets (2.5 mg total) by mouth daily. 03/22/19   Martinique, Betty G, MD  aspirin-acetaminophen-caffeine (EXCEDRIN MIGRAINE) [phone] MG tablet Take 1 tablet by mouth every 6 (six) hours as needed for headache.    [provider]  Aspirin-Acetaminophen-Caffeine (GOODYS EXTRA STRENGTH) [phone] MG PACK Take 1 packet by mouth every 8 (eight) hours as needed (for pain. Takes at least 1-2 per day).     [provider]  atorvastatin (LIPITOR) 10 MG tablet Take 1 tablet (10 mg total) by mouth daily. 07/02/18   Martinique, Betty G, MD  losartan (COZAAR) 50 MG tablet Take 1 tablet (50 mg total) by mouth daily. 03/22/19   Martinique, Betty G, MD  naproxen (NAPROSYN) 375 MG tablet Take 1 tablet (375 mg total) by mouth 2 (two) times daily. Patient taking differently: Take 375 mg by mouth 2 (two) times daily as needed for mild pain.  06/23/18   Vanessa Kick, MD  tiZANidine (ZANAFLEX) 4 MG tablet Take 1 tablet (4 mg total) by mouth every 8 (eight) hours as needed for muscle spasms. 03/22/19   Martinique, Betty G, MD  vitamin B-12 (CYANOCOBALAMIN) 100 MCG tablet Take 100 mcg by mouth daily.    [provider]    Physical Exam: Vitals:   05/21/19 1445 05/21/19 1545 05/21/19 1609 05/21/19 1628  BP: (!) 159/93 (!) 169/103  (!) 190/102  Pulse: 88 87  89  Resp: 15 14  20   Temp:    98.9 F (37.2 C) 98.6 F (37 C)  TempSrc:   Oral Oral  SpO2: 99% 98%  99%  Weight:    70 kg  Height:    5\' 1"  (1.549 m)     .  General:  Appears calm and comfortable and is NAD . Eyes:  PERRL, EOMI, normal lids, iris . ENT:  grossly normal hearing, lips & tongue, mmm . Neck:  no LAD, masses or thyromegaly . Cardiovascular:  RRR, no m/r/g. No LE edema.  [name] Respiratory:   CTA bilaterally with no wheezes/rales/rhonchi.  Normal respiratory effort. . Abdomen:  soft, NT, ND, NABS . Back:   normal alignment, no CVAT . Skin:  no rash or induration seen on limited exam . Musculoskeletal:  grossly normal tone BUE/BLE, good ROM, no bony abnormality; + TTP along R trapezius  . Lower extremity:  No LE edema.  Limited foot exam with no ulcerations.  2+ distal pulses. [name] Psychiatric:  grossly normal mood and affect, speech fluent and appropriate, AOx3 . Neurologic:  CN 2-12 grossly intact, moves all extremities in coordinated fashion, sensation intact    Radiological Exams on Admission: Dg Chest Port 1 View  Result Date: 05/21/2019 CLINICAL DATA:  Central chest and bilateral arm pain today. EXAM: PORTABLE CHEST 1 VIEW COMPARISON:  CT chest 02/16/2017.  PA and lateral chest 02/15/2017. FINDINGS: Lungs are clear. Heart size is normal. No pneumothorax or pleural effusion. Thyromegaly results in deviation of the trachea to the right, unchanged. No acute or focal bony abnormality. IMPRESSION: No acute disease. Electronically Signed   By: Inge Rise M.D.   On: 05/21/2019 12:12    EKG: Unable to personally view in Epic.  Note per PA Harris reports tachycardia with PVCs with no signs of acute ischemia   Labs on Admission: I have personally reviewed the available labs and imaging studies at the time of the admission.  Pertinent labs:   Normal BMP Unremarkable CBC HS troponin 9 TSH 0.092 COVID negative   Assessment/Plan Principal Problem:   Chest pain Active Problems:   Essential  hypertension   Hyperlipidemia   Multinodular goiter   Neck pain, chronic   Chest pain -Patient with acute onset of neck pain radiating into her B arms with radiculopathy with subsequent severe chest pain -Appears to have resolved with NTG -1-2/3 typical symptoms suggestive of noncardiac vs. Atypical chest pain.  -CXR unremarkable.   -HS troponin negative x 2. -EKG not indicative of acute ischemia.   -HEART pathway score is 6, indicating that the patient has an elevated risk score and requires further evaluation. -Will plan to place in observation status on telemetry to rule out ACS by overnight observation.  -Repeat EKG in AM -Start ASA 81 mg daily -Risk factor stratification with HgbA1c and FLP; will also check TSH/free T4 and UDS  HTN -Continue Norvasc, Cozaar  HLD -Previously on Lipitor but denies h/o HLD and not taking this currently  Multinodular goiter -Thyroid scan in 10/19 showed multinodular gland with cold nodule in the R inferior pole -Normal RAI at that time -She has intermittent hyperthyroidism, due to the goiter -She is due to f/u with endocrinology, but this has been delayed due to [facility] -Close outpatient f/u for this issue is important  Neck pain -She was seen virtually for this issue on 6/2 and was given Vicodin and Zanaflex; she had previously been given prednisone -She has been reluctant to take the prednisone -Her symptoms today appear to have radiated from her neck into her B arms with radiculopathy and the chest pain appeared to then occur -If cardiac evaluation is unremarkable, suggest outpatient referral to orthopedics regarding her persistent neck pain   Note: This patient has been tested and is negative for the  novel coronavirus COVID-19.   DVT prophylaxis: Lovenox  Code Status:  Full - confirmed with patient Family Communication: None present Disposition Plan:  Home once clinically improved Consults called: None  Admission status: It is my  clinical opinion that referral for OBSERVATION is reasonable and necessary in this patient based on the above information provided. The aforementioned taken together are felt to place the patient at high risk for further clinical deterioration. However it is anticipated that the patient may be medically stable for discharge from the hospital within 24 to 48 hours.       Karmen Bongo MD [facility]   How to contact the [facility] Attending or Consulting provider [place] or covering provider during after hours [place], for this patient?  1. Check the care team in [facility] and look for a) attending/consulting [facility] provider listed and b) the [facility] team listed 2. Log into [email or web address] and use [place]'s universal password to access. If you do not have the password, please contact the hospital operator. 3. Locate the [facility] provider you are looking for under [facility] and page to a number that you can be directly reached. 4. If you still have difficulty reaching the provider, please page the [facility] (Director on Call) for the Hospitalists listed on amion for assistance.   05/21/2019, 6:11 PM

## 2019-05-21 NOTE — ED Triage Notes (Signed)
Patient complains of CP with bilateral arm pain since early am/ patient reports that her chest feels tight. Patient alert and oriented

## 2019-05-21 NOTE — ED Provider Notes (Signed)
[facility] EMERGENCY DEPARTMENT Provider Note   CSN: [id number] Arrival date & time: 05/21/19  1047     History   Chief Complaint Chief Complaint  Patient presents with  . Chest Pain    HPI Darlene Griffith is a 68 y.o. female.  Who presents emergency department with chief complaint of chest pain.  Patient states that around 10:00 in the morning she went to the bathroom when she had sudden onset of numbness and tingling in her bilateral upper extremities that was followed by a deep achiness in both arms spreading across her shoulder blades and a sensation of heaviness.  She states that she then began feeling squeezing and tightness in her chest and began feeling very short of breath.  She has a past medical history of hypertension, hyperlipidemia, goiter, and is a current smoker.  She denies a previous history of known CAD or heart attack.  She denies any unilateral leg swelling, history of PE or DVT.  She is currently being treated with prednisone for cervical disc radiculopathy     HPI  Past Medical History:  Diagnosis Date  . Chest pain    a. 01/2015 Echo: Nl LV fxn, Gr 1 DD, triv AI, mild MR.  . Essential hypertension   . Hepatic cyst    a. noted on CT 01/2015.  . Multinodular goiter    a. 01/2015 CT chest: multinodular goidter w/ substernal extension of the left lobe of the thyroid assoc w/ rightward deviation of tracheal air column.  . Pulmonary nodules    a. 01/2015 CT Chest: RLL ~ 64mm subpleural nodule - rec f/u in 6-12 mos.  [name] Splenic cyst    a. noted on CT 01/2015.    Patient Active Problem List   Diagnosis Date Noted  . Insomnia 07/27/2018  . Multinodular goiter 07/14/2018  . Chronic fatigue 06/15/2018  . Midsternal chest pain 02/18/2015  . Chest pain   . Cough   . Essential hypertension   . Shortness of breath   . Aortic stenosis   . Pain in the chest   . EKG abnormality   . Malaise and fatigue   . Lung nodule   . Splenic cyst   . Hepatic  cyst   . Hyperlipidemia   . Hypertension 02/16/2015    Past Surgical History:  Procedure Laterality Date  . CESAREAN SECTION       OB History   No obstetric history on file.      Home Medications    Prior to Admission medications   Medication Sig Start Date End Date Taking? Authorizing Provider  amLODipine (NORVASC) 5 MG tablet Take 0.5 tablets (2.5 mg total) by mouth daily. 03/22/19   Martinique, Betty G, MD  aspirin-acetaminophen-caffeine (EXCEDRIN MIGRAINE) [phone] MG tablet Take 1 tablet by mouth every 6 (six) hours as needed for headache.    [provider]  Aspirin-Acetaminophen-Caffeine (GOODYS EXTRA STRENGTH) [phone] MG PACK Take 1 packet by mouth every 8 (eight) hours as needed (for pain. Takes at least 1-2 per day).     [provider]  atorvastatin (LIPITOR) 10 MG tablet Take 1 tablet (10 mg total) by mouth daily. 07/02/18   Martinique, Betty G, MD  losartan (COZAAR) 50 MG tablet Take 1 tablet (50 mg total) by mouth daily. 03/22/19   Martinique, Betty G, MD  naproxen (NAPROSYN) 375 MG tablet Take 1 tablet (375 mg total) by mouth 2 (two) times daily. Patient taking differently: Take 375 mg by  mouth 2 (two) times daily as needed for mild pain.  06/23/18   Vanessa Kick, MD  tiZANidine (ZANAFLEX) 4 MG tablet Take 1 tablet (4 mg total) by mouth every 8 (eight) hours as needed for muscle spasms. 03/22/19   Martinique, Betty G, MD  vitamin B-12 (CYANOCOBALAMIN) 100 MCG tablet Take 100 mcg by mouth daily.    [provider]    Family History Family History  Problem Relation Age of Onset  . Heart attack Maternal Grandmother        deceased  . High blood pressure Mother   . High Cholesterol Mother   . Thyroid disease Mother   . Thyroid disease Sister     Social History Social History   Tobacco Use  . Smoking status: Former Research scientist (life sciences)  . Smokeless tobacco: Never Used  Substance Use Topics  . Alcohol use: No    Alcohol/week: 0.0 standard drinks  . Drug use: No      Allergies   Patient has no known allergies.   Review of Systems Review of Systems Ten systems reviewed and are negative for acute change, except as noted in the HPI.    Physical Exam Updated Vital Signs BP (!) 160/95   Pulse 82   Resp 19   SpO2 97%   Physical Exam Vitals signs and nursing note reviewed.  Constitutional:      General: She is not in acute distress.    Appearance: She is well-developed. She is not diaphoretic.  HENT:     Head: Normocephalic and atraumatic.  Eyes:     General: No scleral icterus.    Conjunctiva/sclera: Conjunctivae normal.  Neck:     Musculoskeletal: Normal range of motion.  Cardiovascular:     Rate and Rhythm: Normal rate and regular rhythm.     Heart sounds: Normal heart sounds. No murmur. No friction rub. No gallop.   Pulmonary:     Effort: Pulmonary effort is normal. No respiratory distress.     Breath sounds: Normal breath sounds.  Abdominal:     General: Bowel sounds are normal. There is no distension.     Palpations: Abdomen is soft. There is no mass.     Tenderness: There is no abdominal tenderness. There is no guarding.  Skin:    General: Skin is warm and dry.  Neurological:     Mental Status: She is alert and oriented to person, place, and time.  Psychiatric:        Mood and Affect: Mood is anxious.        Behavior: Behavior normal.      ED Treatments / Results  Labs (all labs ordered are listed, but only abnormal results are displayed) Labs Reviewed  CBC - Abnormal; Notable for the following components:      Result Value   RBC 3.86 (*)    MCV 106.2 (*)    MCH 34.5 (*)    All other components within normal limits  SARS CORONAVIRUS 2 (HOSPITAL ORDER, [facility])  BASIC METABOLIC PANEL  TSH  TROPONIN I (HIGH SENSITIVITY)    EKG None ED ECG REPORT   Date: 05/21/2019  Rate: 103  Rhythm: sinus tachycardia  QRS Axis: normal  Intervals: normal  ST/T Wave abnormalities: normal   Conduction Disutrbances:none  Narrative Interpretation:   Old EKG Reviewed: none available  Sinus tachycardia with frequent PVCs   Radiology Dg Chest Port 1 View  Result Date: 05/21/2019 CLINICAL DATA:  Central chest and  bilateral arm pain today. EXAM: PORTABLE CHEST 1 VIEW COMPARISON:  CT chest 02/16/2017.  PA and lateral chest 02/15/2017. FINDINGS: Lungs are clear. Heart size is normal. No pneumothorax or pleural effusion. Thyromegaly results in deviation of the trachea to the right, unchanged. No acute or focal bony abnormality. IMPRESSION: No acute disease. Electronically Signed   By: Inge Rise M.D.   On: 05/21/2019 12:12    Procedures Procedures (including critical care time)  Medications Ordered in ED Medications  sodium chloride flush (NS) 0.9 % injection 3 mL (has no administration in time range)  nitroGLYCERIN (NITROSTAT) SL tablet 0.4 mg (0.4 mg Sublingual Given 05/21/19 1232)  aspirin chewable tablet 324 mg (324 mg Oral Given 05/21/19 1229)     Initial Impression / Assessment and Plan / ED Course  I have reviewed the triage vital signs and the nursing notes.  Pertinent labs & imaging results that were available during my care of the patient were reviewed by me and considered in my medical decision making (see chart for details).  Clinical Course as of May 20 1254  Sat May 21, 2019  1235 MCV(!): 106.2 [AH]    Clinical Course User Index [AH] Margarita Mail, PA-C       68 year old female here with burning episode of chest pain.  She has a heart score of 6.  Personally reviewed the patient's labs which show initial high-sensitivity troponin without elevation,     negative COVID test, BMP shows no significant abnormalities.  CBC shows macrocytosis without anemia, personally reviewed the patient's 1 view chest x-ray which shows no abnormalities, no edema or consolidation.  EKG shows tachycardia with PVCs.  No signs of acute ischemia.  Given the patient's concerning  symptoms, heart score I think she will need admission for chest pain work-up.  I spoke with Dr. Lorin Mercy admit Evaluation and management.  She is stable throughout her ED visit.                Final Clinical Impressions(s) / ED Diagnoses   Final diagnoses:  Chest pain with moderate risk of acute coronary syndrome    ED Discharge Orders    None       Margarita Mail, PA-C 05/21/19 1709    Noemi Chapel, MD 05/24/19 2035

## 2019-05-22 DIAGNOSIS — G8929 Other chronic pain: Secondary | ICD-10-CM | POA: Diagnosis not present

## 2019-05-22 DIAGNOSIS — R079 Chest pain, unspecified: Secondary | ICD-10-CM | POA: Diagnosis not present

## 2019-05-22 DIAGNOSIS — K7689 Other specified diseases of liver: Secondary | ICD-10-CM | POA: Diagnosis not present

## 2019-05-22 DIAGNOSIS — E785 Hyperlipidemia, unspecified: Secondary | ICD-10-CM | POA: Diagnosis not present

## 2019-05-22 DIAGNOSIS — E0520 Thyrotoxicosis with toxic multinodular goiter without thyrotoxic crisis or storm: Secondary | ICD-10-CM | POA: Diagnosis not present

## 2019-05-22 DIAGNOSIS — F17210 Nicotine dependence, cigarettes, uncomplicated: Secondary | ICD-10-CM | POA: Diagnosis not present

## 2019-05-22 DIAGNOSIS — I10 Essential (primary) hypertension: Secondary | ICD-10-CM | POA: Diagnosis not present

## 2019-05-22 DIAGNOSIS — M542 Cervicalgia: Secondary | ICD-10-CM | POA: Diagnosis not present

## 2019-05-22 DIAGNOSIS — Z79899 Other long term (current) drug therapy: Secondary | ICD-10-CM | POA: Diagnosis not present

## 2019-05-22 DIAGNOSIS — Z1159 Encounter for screening for other viral diseases: Secondary | ICD-10-CM | POA: Diagnosis not present

## 2019-05-22 DIAGNOSIS — Z791 Long term (current) use of non-steroidal anti-inflammatories (NSAID): Secondary | ICD-10-CM | POA: Diagnosis not present

## 2019-05-22 DIAGNOSIS — Z8249 Family history of ischemic heart disease and other diseases of the circulatory system: Secondary | ICD-10-CM | POA: Diagnosis not present

## 2019-05-22 DIAGNOSIS — R072 Precordial pain: Secondary | ICD-10-CM | POA: Diagnosis not present

## 2019-05-22 LAB — RAPID URINE DRUG SCREEN, HOSP PERFORMED
Amphetamines: NOT DETECTED
Barbiturates: NOT DETECTED
Benzodiazepines: NOT DETECTED
Cocaine: NOT DETECTED
Opiates: NOT DETECTED
Tetrahydrocannabinol: NOT DETECTED

## 2019-05-22 LAB — T4, FREE: Free T4: 0.84 ng/dL (ref 0.61–1.12)

## 2019-05-22 MED ORDER — AMLODIPINE BESYLATE 5 MG PO TABS: 5.0000 mg | ORAL_TABLET | Freq: Every day | ORAL | 3 refills | Status: DC

## 2019-05-22 NOTE — Discharge Summary (Signed)
Physician Discharge Summary  Darlene Griffith:[id number] DOB: 13-Jan-1951 DOA: 05/21/2019  PCP: Martinique, Betty G, MD  Admit date: 05/21/2019  Discharge date: 05/22/2019  Admitted From:Home  Disposition:  Home  Recommendations for Outpatient Follow-up:  1. Follow up with PCP in 1-2 weeks 2. Repeat blood pressure readings in the office 3. Amlodipine increased from 2.5 mg daily to 5 mg daily 4. We will follow-up with neurosurgery Dr. Ellene Route regarding DJD on 8/15 as scheduled previously 5. LDL 133.  Patient does not want to start on Lipitor currently, but please consider outpatient initiation 6. TSH 0.092 and will need follow-up in outpatient setting.  Patient does have multinodular goiter and will require further follow-up with her endocrinologist in the near future.  Home Health: None  Equipment/Devices: None  Discharge Condition: Stable  CODE STATUS: Full  Diet recommendation: Heart Healthy  Brief/Interim Summary: Per HPI: Darlene Griffith is a 68 y.o. female with medical history significant of incidental findings on CT in 2016 (splenic and hepatic cysts, pulmonary nodules, multinodular goiter) and HTN presenting with chest pain.  Last Echo in 4/16 showed grade 1 diastolic dysfunction; she presented with chest pain at that time and was observed without stress test/cath.  She woke up this AM and was excited to spend the day with her grandson.  When she was walking out of the bathroom, both arms started throbbing and hurting.  It felt like someone was pulling the veins out - not a sharp pain.  It radiated to her hands.  She sat down and then noticed severe chest tightness in the substernal region.  She felt like she couldn't take a deep enough breath.  A month ago, they diagnosed a disc and nerve problem in her neck and she thought this could be related to that.  She had been prescribed steroids but only took it when the pain was unbearable and would otherwise take Tylenol Arthritis.   She had a  sister who was a junkie and she was afraid to get addicted.  She had a scratchy throat for the last 3-4 days and she was nervous about COVID; she spends lots of time with her grandchildren and wouldn't want to give it to them.  She had been diagnosed with hyperthyroidism and COVID came in and she was supposed to go to [facility] for a second opinion and it hasn't happened yet.   She does feel like the NTG made it ease off.  She thinks the pain is completely gone now.  Patient was admitted with atypical chest pain related to acute onset of neck pain with associated radiculopathy.  She states that she has had pain similar to this previously and it is usually quite brief.  As a matter fact, throughout the course of this admission she is no longer had any pain and troponins have had flat trend with no other acute findings on EKG noted either.  Patient is reluctant to start any aggressive pain management to include narcotics or even steroid taper at this time.  Her symptoms have fully resolved and she is otherwise in stable condition for discharge.  She has follow-up to neurosurgery Dr. Ellene Route on 8/15 for further evaluation of her condition.  She was noted to have borderline elevated blood pressure readings for which I have increased her dose of amlodipine to 5 mg from 2.5 mg and this will require close follow-up to ensure that her blood pressures are improved and stable.  No other acute events noted throughout the  course of this admission.  Discharge Diagnoses:  Principal Problem:   Chest pain Active Problems:   Essential hypertension   Hyperlipidemia   Multinodular goiter   Neck pain, chronic  Principal discharge diagnosis: Atypical chest pain likely secondary to cervical/thoracic radiculopathy.  Discharge Instructions  Discharge Instructions    Diet - low sodium heart healthy   Complete by: As directed    Increase activity slowly   Complete by: As directed      Allergies as of 05/22/2019   No Known  Allergies     Medication List    TAKE these medications   acetaminophen 650 MG CR tablet Commonly known as: TYLENOL Take 650 mg by mouth every 8 (eight) hours as needed for pain.   amLODipine 5 MG tablet Commonly known as: NORVASC Take 1 tablet (5 mg total) by mouth daily. What changed: how much to take   diclofenac sodium 1 % Gel Commonly known as: VOLTAREN Apply 2 Griffith topically 4 (four) times daily as needed (pain).   losartan 50 MG tablet Commonly known as: COZAAR Take 1 tablet (50 mg total) by mouth daily.   tiZANidine 4 MG tablet Commonly known as: ZANAFLEX Take 1 tablet (4 mg total) by mouth every 8 (eight) hours as needed for muscle spasms.   triamcinolone cream 0.1 % Commonly known as: KENALOG Apply 1 application topically 2 (two) times daily as needed for pain.      Follow-up Information    Martinique, Betty G, MD Follow up in 1 week(s).   Specialty: Family Medicine Contact information: [place] [id number] [phone]          No Known Allergies  Consultations:  None   Procedures/Studies: Dg Chest Port 1 View  Result Date: 05/21/2019 CLINICAL DATA:  Central chest and bilateral arm pain today. EXAM: PORTABLE CHEST 1 VIEW COMPARISON:  CT chest 02/16/2017.  PA and lateral chest 02/15/2017. FINDINGS: Lungs are clear. Heart size is normal. No pneumothorax or pleural effusion. Thyromegaly results in deviation of the trachea to the right, unchanged. No acute or focal bony abnormality. IMPRESSION: No acute disease. Electronically Signed   By: Inge Rise M.D.   On: 05/21/2019 12:12     Discharge Exam: Vitals:   05/22/19 0523 05/22/19 1013  BP: (!) 135/94 138/89  Pulse: 75 75  Resp: 18   Temp: 98.3 F (36.8 C)   SpO2: 96% 97%   Vitals:   05/21/19 2018 05/22/19 0031 05/22/19 0523 05/22/19 1013  BP: (!) 161/96 (!) 151/87 (!) 135/94 138/89  Pulse: 86 80 75 75  Resp: 18 18 18    Temp: 98.8 F (37.1 C) 98.3 F (36.8 C) 98.3  F (36.8 C)   TempSrc: Oral Oral Oral   SpO2: 97% 95% 96% 97%  Weight:   69.6 kg   Height:        General: Pt is alert, awake, not in acute distress Cardiovascular: RRR, S1/S2 +, no rubs, no gallops Respiratory: CTA bilaterally, no wheezing, no rhonchi Abdominal: Soft, NT, ND, bowel sounds + Extremities: no edema, no cyanosis    The results of significant diagnostics from this hospitalization (including imaging, microbiology, ancillary and laboratory) are listed below for reference.     Microbiology: Recent Results (from the past 240 hour(s))  SARS Coronavirus 2 [facility] order, Performed in [facility] hospital lab) Nasopharyngeal Nasopharyngeal Swab     Status: None   Collection Time: 05/21/19 12:36 PM   Specimen: Nasopharyngeal Swab  Result Value  Ref Range Status   SARS Coronavirus 2 NEGATIVE NEGATIVE Final    Comment: (NOTE) If result is NEGATIVE SARS-CoV-2 target nucleic acids are NOT DETECTED. The SARS-CoV-2 RNA is generally detectable in upper and lower  respiratory specimens during the acute phase of infection. The lowest  concentration of SARS-CoV-2 viral copies this assay can detect is 250  copies / mL. A negative result does not preclude SARS-CoV-2 infection  and should not be used as the sole basis for treatment or other  patient management decisions.  A negative result may occur with  improper specimen collection / handling, submission of specimen other  than nasopharyngeal swab, presence of viral mutation(s) within the  areas targeted by this assay, and inadequate number of viral copies  (<250 copies / mL). A negative result must be combined with clinical  observations, patient history, and epidemiological information. If result is POSITIVE SARS-CoV-2 target nucleic acids are DETECTED. The SARS-CoV-2 RNA is generally detectable in upper and lower  respiratory specimens dur ing the acute phase of infection.  Positive  results are indicative of active  infection with SARS-CoV-2.  Clinical  correlation with patient history and other diagnostic information is  necessary to determine patient infection status.  Positive results do  not rule out bacterial infection or co-infection with other viruses. If result is PRESUMPTIVE POSTIVE SARS-CoV-2 nucleic acids MAY BE PRESENT.   A presumptive positive result was obtained on the submitted specimen  and confirmed on repeat testing.  While 2019 novel coronavirus  (SARS-CoV-2) nucleic acids may be present in the submitted sample  additional confirmatory testing may be necessary for epidemiological  and / or clinical management purposes  to differentiate between  SARS-CoV-2 and other Sarbecovirus currently known to infect humans.  If clinically indicated additional testing with an alternate test  methodology [phone]) is advised. The SARS-CoV-2 RNA is generally  detectable in upper and lower respiratory sp ecimens during the acute  phase of infection. The expected result is Negative. Fact Sheet for Patients:  StrictlyIdeas.no Fact Sheet for Healthcare Providers: BankingDealers.co.za This test is not yet approved or cleared by the [place] FDA and has been authorized for detection and/or diagnosis of SARS-CoV-2 by FDA under an Emergency Use Authorization (EUA).  This EUA will remain in effect (meaning this test can be used) for the duration of the COVID-19 declaration under Section 564(b)(1) of the Act, 21 U.S.C. section 360bbb-3(b)(1), unless the authorization is terminated or revoked sooner. Performed at [facility], [place] [address], [place], [place] [address]      Labs: BNP (last 3 results) No results for input(s): BNP in the last 8760 hours. Basic Metabolic Panel: Recent Labs  Lab 05/21/19 1141  NA 140  K 3.8  CL 109  CO2 25  GLUCOSE 92  BUN 8  CREATININE 0.53  CALCIUM 9.2   Liver Function Tests: No results for  input(s): AST, ALT, ALKPHOS, BILITOT, PROT, ALBUMIN in the last 168 hours. No results for input(s): LIPASE, AMYLASE in the last 168 hours. No results for input(s): AMMONIA in the last 168 hours. CBC: Recent Labs  Lab 05/21/19 1141  WBC 9.3  HGB 13.3  HCT 41.0  MCV 106.2*  PLT 246   Cardiac Enzymes: No results for input(s): CKTOTAL, CKMB, CKMBINDEX, TROPONINI in the last 168 hours. BNP: Invalid input(s): POCBNP CBG: No results for input(s): GLUCAP in the last 168 hours. D-Dimer No results for input(s): DDIMER in the last 72 hours. Hgb A1c Recent Labs  05/21/19 1141  HGBA1C 5.0   Lipid Profile Recent Labs    05/21/19 1340  CHOL 217*  HDL 46  LDLCALC 133*  TRIG 190*  CHOLHDL 4.7   Thyroid function studies Recent Labs    05/21/19 1144  TSH 0.092*   Anemia work up No results for input(s): VITAMINB12, FOLATE, FERRITIN, TIBC, IRON, RETICCTPCT in the last 72 hours. Urinalysis    Component Value Date/Time   COLORURINE YELLOW 02/27/2015 1507   APPEARANCEUR CLEAR 02/27/2015 1507   LABSPEC >=1.030 (A) 02/27/2015 1507   PHURINE 5.5 02/27/2015 1507   GLUCOSEU NEGATIVE 02/27/2015 1507   HGBUR MODERATE (A) 02/27/2015 1507   BILIRUBINUR NEGATIVE 02/27/2015 1507   KETONESUR NEGATIVE 02/27/2015 1507   PROTEINUR NEGATIVE 02/16/2015 1935   UROBILINOGEN 0.2 02/27/2015 1507   NITRITE NEGATIVE 02/27/2015 1507   LEUKOCYTESUR NEGATIVE 02/27/2015 1507   Sepsis Labs Invalid input(s): PROCALCITONIN,  WBC,  LACTICIDVEN Microbiology Recent Results (from the past 240 hour(s))  SARS Coronavirus 2 [facility] order, Performed in [facility] hospital lab) Nasopharyngeal Nasopharyngeal Swab     Status: None   Collection Time: 05/21/19 12:36 PM   Specimen: Nasopharyngeal Swab  Result Value Ref Range Status   SARS Coronavirus 2 NEGATIVE NEGATIVE Final    Comment: (NOTE) If result is NEGATIVE SARS-CoV-2 target nucleic acids are NOT DETECTED. The SARS-CoV-2 RNA is generally  detectable in upper and lower  respiratory specimens during the acute phase of infection. The lowest  concentration of SARS-CoV-2 viral copies this assay can detect is 250  copies / mL. A negative result does not preclude SARS-CoV-2 infection  and should not be used as the sole basis for treatment or other  patient management decisions.  A negative result may occur with  improper specimen collection / handling, submission of specimen other  than nasopharyngeal swab, presence of viral mutation(s) within the  areas targeted by this assay, and inadequate number of viral copies  (<250 copies / mL). A negative result must be combined with clinical  observations, patient history, and epidemiological information. If result is POSITIVE SARS-CoV-2 target nucleic acids are DETECTED. The SARS-CoV-2 RNA is generally detectable in upper and lower  respiratory specimens dur ing the acute phase of infection.  Positive  results are indicative of active infection with SARS-CoV-2.  Clinical  correlation with patient history and other diagnostic information is  necessary to determine patient infection status.  Positive results do  not rule out bacterial infection or co-infection with other viruses. If result is PRESUMPTIVE POSTIVE SARS-CoV-2 nucleic acids MAY BE PRESENT.   A presumptive positive result was obtained on the submitted specimen  and confirmed on repeat testing.  While 2019 novel coronavirus  (SARS-CoV-2) nucleic acids may be present in the submitted sample  additional confirmatory testing may be necessary for epidemiological  and / or clinical management purposes  to differentiate between  SARS-CoV-2 and other Sarbecovirus currently known to infect humans.  If clinically indicated additional testing with an alternate test  methodology [phone]) is advised. The SARS-CoV-2 RNA is generally  detectable in upper and lower respiratory sp ecimens during the acute  phase of infection. The  expected result is Negative. Fact Sheet for Patients:  StrictlyIdeas.no Fact Sheet for Healthcare Providers: BankingDealers.co.za This test is not yet approved or cleared by the [place] FDA and has been authorized for detection and/or diagnosis of SARS-CoV-2 by FDA under an Emergency Use Authorization (EUA).  This EUA will remain in effect (meaning this test can be used)  for the duration of the COVID-19 declaration under Section 564(b)(1) of the Act, 21 U.S.C. section 360bbb-3(b)(1), unless the authorization is terminated or revoked sooner. Performed at [facility], [place] [address] [place], [place] [address]      Time coordinating discharge: 35 minutes  SIGNED:   Rodena Goldmann, DO [facility] 05/22/2019, 10:26 AM  If 7PM-7AM, please contact night-coverage [email or web address] Password [email or web address]

## 2019-05-23 LAB — HIV ANTIBODY (ROUTINE TESTING W REFLEX): HIV Screen 4th Generation wRfx: NONREACTIVE

## 2019-06-24 ENCOUNTER — Other Ambulatory Visit: Payer: Self-pay | Admitting: Neurological Surgery

## 2019-06-24 DIAGNOSIS — I10 Essential (primary) hypertension: Secondary | ICD-10-CM | POA: Diagnosis not present

## 2019-06-24 DIAGNOSIS — M5020 Other cervical disc displacement, unspecified cervical region: Secondary | ICD-10-CM | POA: Diagnosis not present

## 2019-06-24 DIAGNOSIS — Z6829 Body mass index (BMI) 29.0-29.9, adult: Secondary | ICD-10-CM | POA: Diagnosis not present

## 2019-06-29 NOTE — Progress Notes (Signed)
[place] ([address]), [place] [id number] [place] ([place]) [place] [address] Phone: [phone] Fax: [phone]    Your procedure is scheduled on Tuesday, September 15th.  Report to [facility] Main Entrance "A" at 8:45 A.M., and check in at the Admitting office.  Call this number if you have problems the morning of surgery:  [phone]  Call [phone] if you have any questions prior to your surgery date Monday-Friday 8am-4pm   Remember:  Do not eat or drink after midnight the night before your surgery    Take these medicines the morning of surgery with A SIP OF WATER  amLODipine (NORVASC)  If needed - acetaminophen (TYLENOL), tiZANidine (ZANAFLEX)   7 days prior to surgery STOP taking any Aspirin (unless otherwise instructed by your surgeon), diclofenac sodium (VOLTAREN),  Aleve, Naproxen, Ibuprofen, Motrin, Advil, Goody's, BC's, all herbal medications, fish oil, and all vitamins.   The Morning of Surgery  Do not wear jewelry, make-up or nail polish.  Do not wear lotions, powders, or perfumes/colognes, or deodorant  Do not shave 48 hours prior to surgery.   Do not bring valuables to the hospital.  [facility] is not responsible for any belongings or valuables.  If you are a smoker, DO NOT Smoke 24 hours prior to surgery IF you wear a CPAP at night please bring your mask, tubing, and machine the morning of surgery   Remember that you must have someone to transport you home after your surgery, and remain with you for 24 hours if you are discharged the same day.  Contacts, glasses, hearing aids, dentures or bridgework may not be worn into surgery.   Leave your suitcase in the car.  After surgery it may be brought to your room.  For patients admitted to the hospital, discharge time will be determined by your treatment team.  Patients discharged the day of surgery will not be allowed to drive home.   Special instructions:   [facility]- Preparing For Surgery  Before surgery, you can play an important role. Because skin is not sterile, your skin needs to be as free of germs as possible. You can reduce the number of germs on your skin by washing with CHG (chlorahexidine gluconate) Soap before surgery.  CHG is an antiseptic cleaner which kills germs and bonds with the skin to continue killing germs even after washing.    Oral Hygiene is also important to reduce your risk of infection.  Remember - BRUSH YOUR TEETH THE MORNING OF SURGERY WITH YOUR REGULAR TOOTHPASTE  Please do not use if you have an allergy to CHG or antibacterial soaps. If your skin becomes reddened/irritated stop using the CHG.  Do not shave (including legs and underarms) for at least 48 hours prior to first CHG shower. It is OK to shave your face.  Please follow these instructions carefully.   1. Shower the NIGHT BEFORE SURGERY and the MORNING OF SURGERY with CHG Soap.   2. If you chose to wash your hair, wash your hair first as usual with your normal shampoo.  3. After you shampoo, rinse your hair and body thoroughly to remove the shampoo.  4. Use CHG as you would any other liquid soap. You can apply CHG directly to the skin and wash gently with a scrungie or a clean washcloth.   5. Apply the CHG Soap to your body ONLY FROM THE NECK DOWN.  Do not use on open wounds or  open sores. Avoid contact with your eyes, ears, mouth and genitals (private parts). Wash Face and genitals (private parts)  with your normal soap.   6. Wash thoroughly, paying special attention to the area where your surgery will be performed.  7. Thoroughly rinse your body with warm water from the neck down.  8. DO NOT shower/wash with your normal soap after using and rinsing off the CHG Soap.  9. Pat yourself dry with a CLEAN TOWEL.  10. Wear CLEAN PAJAMAS to bed the night before surgery, wear comfortable clothes the morning of surgery  11. Place CLEAN SHEETS on your bed the  night of your first shower and DO NOT SLEEP WITH PETS.  Day of Surgery: Do not apply any deodorants/lotions. Please shower the morning of surgery with the CHG soap  Please wear clean clothes to the hospital/surgery center.   Remember to brush your teeth WITH YOUR REGULAR TOOTHPASTE.  Please read over the following fact sheets that you were given.

## 2019-06-30 ENCOUNTER — Other Ambulatory Visit: Payer: Self-pay

## 2019-06-30 ENCOUNTER — Encounter (HOSPITAL_COMMUNITY)
Admission: RE | Admit: 2019-06-30 | Discharge: 2019-06-30 | Disposition: A | Discharge status: Home / Self Care | Payer: Medicare Other | Source: Ambulatory Visit | Attending: Neurological Surgery | Admitting: Neurological Surgery

## 2019-06-30 ENCOUNTER — Encounter (HOSPITAL_COMMUNITY): Payer: Self-pay

## 2019-06-30 DIAGNOSIS — Z01812 Encounter for preprocedural laboratory examination: Secondary | ICD-10-CM | POA: Insufficient documentation

## 2019-06-30 DIAGNOSIS — Z20828 Contact with and (suspected) exposure to other viral communicable diseases: Secondary | ICD-10-CM | POA: Diagnosis not present

## 2019-06-30 DIAGNOSIS — Z0184 Encounter for antibody response examination: Secondary | ICD-10-CM | POA: Insufficient documentation

## 2019-06-30 HISTORY — DX: Thyrotoxicosis, unspecified without thyrotoxic crisis or storm: E05.90

## 2019-06-30 LAB — BASIC METABOLIC PANEL
Anion gap: 9 (ref 5–15)
BUN: 12 mg/dL (ref 8–23)
CO2: 24 mmol/L (ref 22–32)
Calcium: 8.9 mg/dL (ref 8.9–10.3)
Chloride: 107 mmol/L (ref 98–111)
Creatinine, Ser: 0.72 mg/dL (ref 0.44–1.00)
GFR calc Af Amer: >60 mL/min (ref >60)
GFR calc non Af Amer: >60 mL/min (ref >60)
Glucose, Bld: 103 mg/dL — ABNORMAL HIGH (ref 70–99)
Potassium: 5.2 mmol/L — ABNORMAL HIGH (ref 3.5–5.1)
Sodium: 140 mmol/L (ref 135–145)

## 2019-06-30 LAB — CBC
HCT: 42.3 % (ref 36.0–46.0)
Hemoglobin: 13.6 g/dL (ref 12.0–15.0)
MCH: 34.2 pg — ABNORMAL HIGH (ref 26.0–34.0)
MCHC: 32.2 g/dL (ref 30.0–36.0)
MCV: 106.3 fL — ABNORMAL HIGH (ref 80.0–100.0)
Platelets: 288 10*3/uL (ref 150–400)
RBC: 3.98 MIL/uL (ref 3.87–5.11)
RDW: 11.9 % (ref 11.5–15.5)
WBC: 7.9 10*3/uL (ref 4.0–10.5)
nRBC: 0 % (ref 0.0–0.2)

## 2019-06-30 LAB — SURGICAL PCR SCREEN
MRSA, PCR: NEGATIVE
Staphylococcus aureus: NEGATIVE

## 2019-06-30 LAB — TYPE AND SCREEN
ABO/RH(D): O POS
Antibody Screen: NEGATIVE

## 2019-06-30 LAB — ABO/RH: ABO/RH(D): O POS

## 2019-06-30 NOTE — Progress Notes (Signed)
PCP - Betty Martinique, MD Cardiologist - Candee Furbish, MD  Chest x-ray - 05/21/2019 EKG - 05/23/2019 Stress Test - 2013 ECHO - 02/17/2015 Cardiac Cath - denies  Sleep Study - denies CPAP - N/A   Blood Thinner Instructions: denies Aspirin Instructions: N/A  Anesthesia review: Yes; recent ED visit for chest pain; abnormal EKG w/PVCs  Patient denies shortness of breath, fever, cough and chest pain at PAT appointment  COVID test scheduled 07/01/19; patient states verbal understanding of self-quarantine guidelines post testing  Coronavirus Screening  Have you experienced the following symptoms:  Cough yes/no: No Fever (>100.64F)  yes/no: No Runny nose yes/no: No Sore throat yes/no: No Difficulty breathing/shortness of breath  yes/no: No  Have you or a family member traveled in the last 14 days and where? yes/no: No   If the patient indicates "YES" to the above questions, their PAT will be rescheduled to limit the exposure to others and, the surgeon will be notified. THE PATIENT WILL NEED TO BE ASYMPTOMATIC FOR 14 DAYS.   If the patient is not experiencing any of these symptoms, the PAT nurse will instruct them to NOT bring anyone with them to their appointment since they may have these symptoms or traveled as well.   Please remind your patients and families that hospital visitation restrictions are in effect and the importance of the restrictions.    Patient verbalized understanding of instructions that were given to them at the PAT appointment. Patient was also instructed that they will need to review over the PAT instructions again at home before surgery.

## 2019-06-30 NOTE — Progress Notes (Signed)
BMP resulted K+ 5.2 this AM; VM left w/Jessica, scheduler for Dr. Ellene Route and IB message sent to PCP, Dr. Martinique.  Anesthesia will also review chart.

## 2019-07-01 ENCOUNTER — Other Ambulatory Visit (HOSPITAL_COMMUNITY)
Admission: RE | Admit: 2019-07-01 | Discharge: 2019-07-01 | Disposition: A | Discharge status: Home / Self Care | Payer: Medicare Other | Source: Ambulatory Visit | Attending: Neurological Surgery | Admitting: Neurological Surgery

## 2019-07-01 DIAGNOSIS — Z0184 Encounter for antibody response examination: Secondary | ICD-10-CM | POA: Diagnosis not present

## 2019-07-01 DIAGNOSIS — Z01812 Encounter for preprocedural laboratory examination: Secondary | ICD-10-CM | POA: Diagnosis not present

## 2019-07-01 DIAGNOSIS — Z20828 Contact with and (suspected) exposure to other viral communicable diseases: Secondary | ICD-10-CM | POA: Diagnosis not present

## 2019-07-01 NOTE — Progress Notes (Signed)
Anesthesia Chart Review:   Case: [id number] Date/Time: 07/05/19 1033   Procedure: Cervical 6-7 Artificial disc replacement (N/A ) - Cervical 6-7 Artificial disc replacement   Anesthesia type: General   Pre-op diagnosis: Herniated nucleus pulposus, Cervical   Location: [facility] OR ROOM 19 / [place] [facility]   Surgeon: Kristeen Miss, MD      DISCUSSION:  - Pt is a 68 year old female with hx HTN  - Pt hospitalized 8/1-05/22/19 for atypical chest pain thought to be related to her radiculopathy associated with neck pain. EKG without concerning findings, troponin I negative x2.   - Pt has had her multinodular goiter evaluated by endocrinologist Renato Shin, MD 07/2018. Radioactive iodine given 08/18/18.  Pt did not f/u with endocrinology after that.  TSH was low 05/21/19; free T4 was normal 05/22/19.   - Pt had pulmonary nodule on CT in 2016- I do not see that she received repeat scan for this 6-12 months later. Will route to PCP for f/u.   - K+ was 5.2 on pre-admission testing labs but specimen was hemolyzed.    VS: BP (!) 136/95 Comment: notified Tammy RN  Pulse 80   Temp 36.5 C   Resp 20   Ht 5\' 1"  (1.549 m)   Wt 70.5 kg   SpO2 100%   BMI 29.36 kg/m    PROVIDERS: - PCP is Martinique, Betty G, MD   LABS: Labs reviewed: Acceptable for surgery.  - Note CMP blood hemolyzed, impacting K+ results.   (all labs ordered are listed, but only abnormal results are displayed)  Labs Reviewed  BASIC METABOLIC PANEL - Abnormal; Notable for the following components:      Result Value   Potassium 5.2 (*)    Glucose, Bld 103 (*)    All other components within normal limits  CBC - Abnormal; Notable for the following components:   MCV 106.3 (*)    MCH 34.2 (*)    All other components within normal limits  SURGICAL PCR SCREEN  TYPE AND SCREEN  ABO/RH     IMAGES:  1 view CXR 05/21/19: No acute disease.  CT chest 02/17/15:  1. No acute cardiopulmonary disease. Specifically, no focal airspace opacities to  suggest pneumonia. 2. Indeterminate punctate (approximately 5 mm) subpleural nodule within the right lower lobe. Comparison with prior outside examinations is recommended. If the patient is at high risk for bronchogenic carcinoma, follow-up chest CT at 6-12 months is recommended. If the patient is at low risk for bronchogenic carcinoma, follow-up chest CT at 12 months is recommended. This recommendation follows the consensus statement: Guidelines for Management of Small Pulmonary Nodules Detected on CT Scans: A Statement from the [place] as published in Radiology 2005;237:395-400. 3. Ill-defined hypoattenuating hepatic and splenic lesions, incompletely characterized on the present examination though favored to represent cysts. 4. Findings compatible with multi nodular goiter with substernal extension of the left lobe of the thyroid to near the level of the carina with associated rightward deviation of the tracheal air column as demonstrated on prior chest radiographs.   EKG 05/22/19:  NSR. Nonspecific ST abnormality   CV:  Echo 02/17/15:  - Left ventricle: The cavity size was normal. Systolic function was normal. Wall motion was normal; there were no regional wall motion abnormalities. Doppler parameters are consistent with abnormal left ventricular relaxation (grade 1 diastolic dysfunction). - Aortic valve: There was trivial regurgitation. Valve area (VTI): 2.43 cm^2. Valve area (Vmax): 2.1 cm^2. Valve area (Vmean): 2.22 cm^2. - Mitral  valve: There was mild regurgitation.  Nuclear stress test 09/04/12 (care everywhere):  1. Myocardial perfusion imaging reveals probable breast attenuation artifact without evidence of ischemia  2. Left ventricular systolic function is normal without regional wall motion abnormalities 3. Left ventricular ejection fraction of 68%.    Past Medical History:  Diagnosis Date  . Chest pain    a. 01/2015 Echo: Nl LV fxn, Gr 1 DD, triv AI, mild MR.  .  Essential hypertension   . Hepatic cyst    a. noted on CT 01/2015.  [name] Hyperthyroidism    GOING TO [facility] FOR SECOND OPINION  . Multinodular goiter    a. 01/2015 CT chest: multinodular goidter w/ substernal extension of the left lobe of the thyroid assoc w/ rightward deviation of tracheal air column.  . Neck pain, chronic   . Pulmonary nodules    a. 01/2015 CT Chest: RLL ~ 30mm subpleural nodule - rec f/u in 6-12 mos.  [name] Splenic cyst    a. noted on CT 01/2015.    Past Surgical History:  Procedure Laterality Date  . CESAREAN SECTION    . TONSILLECTOMY     AROUND 5-6 YRS OLD    MEDICATIONS: . acetaminophen (TYLENOL) 650 MG CR tablet  . amLODipine (NORVASC) 5 MG tablet  . diclofenac sodium (VOLTAREN) 1 % GEL  . losartan (COZAAR) 50 MG tablet  . tiZANidine (ZANAFLEX) 4 MG tablet  . triamcinolone cream (KENALOG) 0.1 %   No current facility-administered medications for this encounter.     If no changes, I anticipate pt can proceed with surgery as scheduled.   Willeen Cass, FNP-BC [facility] Short Stay Surgical [facility]/Anesthesiology Phone: [phone] 07/01/2019 11:59 AM

## 2019-07-03 LAB — NOVEL CORONAVIRUS, NAA (HOSP ORDER, SEND-OUT TO REF LAB; TAT 18-24 HRS): SARS-CoV-2, NAA: NOT DETECTED

## 2019-07-05 ENCOUNTER — Observation Stay (HOSPITAL_COMMUNITY)
Admission: AD | Admit: 2019-07-05 | Discharge: 2019-07-06 | Disposition: A | Discharge status: Home / Self Care | Payer: Medicare Other | Attending: Neurological Surgery | Admitting: Neurological Surgery

## 2019-07-05 ENCOUNTER — Ambulatory Visit (HOSPITAL_COMMUNITY): Payer: Medicare Other

## 2019-07-05 ENCOUNTER — Other Ambulatory Visit: Payer: Self-pay

## 2019-07-05 ENCOUNTER — Encounter (HOSPITAL_COMMUNITY): Payer: Self-pay

## 2019-07-05 ENCOUNTER — Encounter (HOSPITAL_COMMUNITY)
Admission: AD | Disposition: A | Discharge status: Home / Self Care | Payer: Self-pay | Source: Home / Self Care | Attending: Neurological Surgery

## 2019-07-05 ENCOUNTER — Ambulatory Visit (HOSPITAL_COMMUNITY): Payer: Medicare Other | Admitting: Certified Registered Nurse Anesthetist

## 2019-07-05 ENCOUNTER — Ambulatory Visit (HOSPITAL_COMMUNITY): Payer: Medicare Other | Admitting: Emergency Medicine

## 2019-07-05 DIAGNOSIS — M5020 Other cervical disc displacement, unspecified cervical region: Secondary | ICD-10-CM | POA: Diagnosis not present

## 2019-07-05 DIAGNOSIS — I10 Essential (primary) hypertension: Secondary | ICD-10-CM | POA: Diagnosis not present

## 2019-07-05 DIAGNOSIS — Z87891 Personal history of nicotine dependence: Secondary | ICD-10-CM | POA: Diagnosis not present

## 2019-07-05 DIAGNOSIS — E0590 Thyrotoxicosis, unspecified without thyrotoxic crisis or storm: Secondary | ICD-10-CM | POA: Insufficient documentation

## 2019-07-05 DIAGNOSIS — Z79899 Other long term (current) drug therapy: Secondary | ICD-10-CM | POA: Diagnosis not present

## 2019-07-05 DIAGNOSIS — Z419 Encounter for procedure for purposes other than remedying health state, unspecified: Secondary | ICD-10-CM

## 2019-07-05 DIAGNOSIS — Z981 Arthrodesis status: Secondary | ICD-10-CM | POA: Diagnosis not present

## 2019-07-05 DIAGNOSIS — M4712 Other spondylosis with myelopathy, cervical region: Secondary | ICD-10-CM | POA: Diagnosis not present

## 2019-07-05 DIAGNOSIS — M4722 Other spondylosis with radiculopathy, cervical region: Secondary | ICD-10-CM | POA: Diagnosis not present

## 2019-07-05 DIAGNOSIS — M50023 Cervical disc disorder at C6-C7 level with myelopathy: Secondary | ICD-10-CM | POA: Diagnosis not present

## 2019-07-05 DIAGNOSIS — M50123 Cervical disc disorder at C6-C7 level with radiculopathy: Secondary | ICD-10-CM | POA: Diagnosis not present

## 2019-07-05 HISTORY — PX: CERVICAL DISC ARTHROPLASTY: SHX587

## 2019-07-05 SURGERY — CERVICAL ANTERIOR DISC ARTHROPLASTY
Anesthesia: General | Site: Spine Cervical

## 2019-07-05 MED ORDER — OXYCODONE HCL 5 MG/5ML PO SOLN: 5.0000 mg | Freq: Once | ORAL | Status: DC | PRN

## 2019-07-05 MED ORDER — PROPOFOL 10 MG/ML IV BOLUS
INTRAVENOUS | Status: DC | PRN
  Administered 2019-07-05: 120 mg via INTRAVENOUS

## 2019-07-05 MED ORDER — MORPHINE SULFATE (PF) 2 MG/ML IV SOLN
2.0000 mg | INTRAVENOUS | Status: DC | PRN
  Administered 2019-07-05: 17:00:00 2 mg via INTRAVENOUS
  Filled 2019-07-05: qty 1

## 2019-07-05 MED ORDER — MIDAZOLAM HCL 2 MG/2ML IJ SOLN
INTRAMUSCULAR | Status: AC
  Filled 2019-07-05: qty 2

## 2019-07-05 MED ORDER — DEXAMETHASONE SODIUM PHOSPHATE 10 MG/ML IJ SOLN
INTRAMUSCULAR | Status: AC
  Filled 2019-07-05: qty 1

## 2019-07-05 MED ORDER — PROPOFOL 10 MG/ML IV BOLUS
INTRAVENOUS | Status: AC
  Filled 2019-07-05: qty 20

## 2019-07-05 MED ORDER — EPHEDRINE SULFATE-NACL 50-0.9 MG/10ML-% IV SOSY
PREFILLED_SYRINGE | INTRAVENOUS | Status: DC | PRN
  Administered 2019-07-05: 5 mg via INTRAVENOUS
  Administered 2019-07-05: 10 mg via INTRAVENOUS

## 2019-07-05 MED ORDER — BUPIVACAINE HCL (PF) 0.5 % IJ SOLN
INTRAMUSCULAR | Status: AC
  Filled 2019-07-05: qty 30

## 2019-07-05 MED ORDER — BUPIVACAINE HCL (PF) 0.5 % IJ SOLN
INTRAMUSCULAR | Status: DC | PRN
  Administered 2019-07-05: 5 mL

## 2019-07-05 MED ORDER — BISACODYL 10 MG RE SUPP: 10.0000 mg | Freq: Every day | RECTAL | Status: DC | PRN

## 2019-07-05 MED ORDER — LIDOCAINE-EPINEPHRINE 1 %-1:100000 IJ SOLN
INTRAMUSCULAR | Status: DC | PRN
  Administered 2019-07-05: 5 mL

## 2019-07-05 MED ORDER — ROCURONIUM BROMIDE 10 MG/ML (PF) SYRINGE
PREFILLED_SYRINGE | INTRAVENOUS | Status: AC
  Filled 2019-07-05: qty 10

## 2019-07-05 MED ORDER — DOCUSATE SODIUM 100 MG PO CAPS
100.0000 mg | ORAL_CAPSULE | Freq: Two times a day (BID) | ORAL | Status: DC
  Administered 2019-07-05 – 2019-07-06 (×2): 100 mg via ORAL
  Filled 2019-07-05 (×2): qty 1

## 2019-07-05 MED ORDER — CEFAZOLIN SODIUM-DEXTROSE 2-4 GM/100ML-% IV SOLN
INTRAVENOUS | Status: AC
  Filled 2019-07-05: qty 100

## 2019-07-05 MED ORDER — CEFAZOLIN SODIUM-DEXTROSE 2-4 GM/100ML-% IV SOLN
2.0000 g | INTRAVENOUS | Status: AC
  Administered 2019-07-05: 12:00:00 2 g via INTRAVENOUS

## 2019-07-05 MED ORDER — EPHEDRINE 5 MG/ML INJ
INTRAVENOUS | Status: AC
  Filled 2019-07-05: qty 10

## 2019-07-05 MED ORDER — CHLORHEXIDINE GLUCONATE CLOTH 2 % EX PADS: 6.0000 | MEDICATED_PAD | Freq: Once | CUTANEOUS | Status: DC

## 2019-07-05 MED ORDER — 0.9 % SODIUM CHLORIDE (POUR BTL) OPTIME
TOPICAL | Status: DC | PRN
  Administered 2019-07-05: 1000 mL

## 2019-07-05 MED ORDER — ALUM & MAG HYDROXIDE-SIMETH 200-200-20 MG/5ML PO SUSP: 30.0000 mL | Freq: Four times a day (QID) | ORAL | Status: DC | PRN

## 2019-07-05 MED ORDER — THROMBIN 5000 UNITS EX SOLR
OROMUCOSAL | Status: DC | PRN
  Administered 2019-07-05: 5 mL

## 2019-07-05 MED ORDER — OXYCODONE HCL 5 MG PO TABS: 5.0000 mg | ORAL_TABLET | Freq: Once | ORAL | Status: DC | PRN

## 2019-07-05 MED ORDER — TIZANIDINE HCL 4 MG PO TABS
4.0000 mg | ORAL_TABLET | Freq: Three times a day (TID) | ORAL | Status: DC | PRN
  Administered 2019-07-05: 4 mg via ORAL
  Filled 2019-07-05: qty 1

## 2019-07-05 MED ORDER — FENTANYL CITRATE (PF) 100 MCG/2ML IJ SOLN
25.0000 ug | INTRAMUSCULAR | Status: DC | PRN
  Administered 2019-07-05: 50 ug via INTRAVENOUS
  Administered 2019-07-05 (×2): 25 ug via INTRAVENOUS
  Administered 2019-07-05: 50 ug via INTRAVENOUS

## 2019-07-05 MED ORDER — KETOROLAC TROMETHAMINE 15 MG/ML IJ SOLN
15.0000 mg | Freq: Four times a day (QID) | INTRAMUSCULAR | Status: DC
  Administered 2019-07-05 – 2019-07-06 (×3): 15 mg via INTRAVENOUS
  Filled 2019-07-05 (×3): qty 1

## 2019-07-05 MED ORDER — LIDOCAINE HCL 4 % EX SOLN
CUTANEOUS | Status: DC | PRN
  Administered 2019-07-05: 3 mL via TOPICAL

## 2019-07-05 MED ORDER — ACETAMINOPHEN 650 MG RE SUPP: 650.0000 mg | RECTAL | Status: DC | PRN

## 2019-07-05 MED ORDER — METHOCARBAMOL 1000 MG/10ML IJ SOLN
500.0000 mg | Freq: Four times a day (QID) | INTRAVENOUS | Status: DC | PRN
  Filled 2019-07-05: qty 5

## 2019-07-05 MED ORDER — SODIUM CHLORIDE 0.9 % IV SOLN
INTRAVENOUS | Status: DC | PRN
  Administered 2019-07-05: 500 mL

## 2019-07-05 MED ORDER — CEFAZOLIN SODIUM-DEXTROSE 2-4 GM/100ML-% IV SOLN
2.0000 g | Freq: Three times a day (TID) | INTRAVENOUS | Status: AC
  Administered 2019-07-05 – 2019-07-06 (×2): 2 g via INTRAVENOUS
  Filled 2019-07-05 (×2): qty 100

## 2019-07-05 MED ORDER — ONDANSETRON HCL 4 MG/2ML IJ SOLN: 4.0000 mg | Freq: Four times a day (QID) | INTRAMUSCULAR | Status: DC | PRN

## 2019-07-05 MED ORDER — PHENYLEPHRINE 40 MCG/ML (10ML) SYRINGE FOR IV PUSH (FOR BLOOD PRESSURE SUPPORT)
PREFILLED_SYRINGE | INTRAVENOUS | Status: AC
  Filled 2019-07-05: qty 10

## 2019-07-05 MED ORDER — ACETAMINOPHEN 325 MG PO TABS: 650.0000 mg | ORAL_TABLET | ORAL | Status: DC | PRN

## 2019-07-05 MED ORDER — DEXAMETHASONE SODIUM PHOSPHATE 10 MG/ML IJ SOLN
INTRAMUSCULAR | Status: DC | PRN
  Administered 2019-07-05: 10 mg via INTRAVENOUS

## 2019-07-05 MED ORDER — THROMBIN 5000 UNITS EX SOLR
CUTANEOUS | Status: AC
  Filled 2019-07-05: qty 5000

## 2019-07-05 MED ORDER — POLYETHYLENE GLYCOL 3350 17 G PO PACK: 17.0000 g | PACK | Freq: Every day | ORAL | Status: DC | PRN

## 2019-07-05 MED ORDER — SODIUM CHLORIDE 0.9 % IV SOLN
INTRAVENOUS | Status: DC | PRN
  Administered 2019-07-05: 50 ug/min via INTRAVENOUS
  Administered 2019-07-05: 25 ug/min via INTRAVENOUS

## 2019-07-05 MED ORDER — PHENYLEPHRINE 40 MCG/ML (10ML) SYRINGE FOR IV PUSH (FOR BLOOD PRESSURE SUPPORT)
PREFILLED_SYRINGE | INTRAVENOUS | Status: DC | PRN
  Administered 2019-07-05: 40 ug via INTRAVENOUS
  Administered 2019-07-05: 80 ug via INTRAVENOUS

## 2019-07-05 MED ORDER — SODIUM CHLORIDE 0.9% FLUSH: 3.0000 mL | INTRAVENOUS | Status: DC | PRN

## 2019-07-05 MED ORDER — METHOCARBAMOL 500 MG PO TABS
500.0000 mg | ORAL_TABLET | Freq: Four times a day (QID) | ORAL | Status: DC | PRN
  Administered 2019-07-05 (×2): 500 mg via ORAL
  Filled 2019-07-05 (×2): qty 1

## 2019-07-05 MED ORDER — PHENOL 1.4 % MT LIQD: 1.0000 | OROMUCOSAL | Status: DC | PRN

## 2019-07-05 MED ORDER — ONDANSETRON HCL 4 MG PO TABS: 4.0000 mg | ORAL_TABLET | Freq: Four times a day (QID) | ORAL | Status: DC | PRN

## 2019-07-05 MED ORDER — SODIUM CHLORIDE 0.9 % IV SOLN: 250.0000 mL | INTRAVENOUS | Status: DC

## 2019-07-05 MED ORDER — FLEET ENEMA 7-19 GM/118ML RE ENEM: 1.0000 | ENEMA | Freq: Once | RECTAL | Status: DC | PRN

## 2019-07-05 MED ORDER — LACTATED RINGERS IV SOLN
INTRAVENOUS | Status: DC
  Administered 2019-07-05: 09:00:00 via INTRAVENOUS

## 2019-07-05 MED ORDER — LIDOCAINE-EPINEPHRINE 1 %-1:100000 IJ SOLN
INTRAMUSCULAR | Status: AC
  Filled 2019-07-05: qty 1

## 2019-07-05 MED ORDER — ONDANSETRON HCL 4 MG/2ML IJ SOLN
INTRAMUSCULAR | Status: AC
  Filled 2019-07-05: qty 2

## 2019-07-05 MED ORDER — MIDAZOLAM HCL 2 MG/2ML IJ SOLN
INTRAMUSCULAR | Status: DC | PRN
  Administered 2019-07-05: 2 mg via INTRAVENOUS

## 2019-07-05 MED ORDER — ACETAMINOPHEN ER 650 MG PO TBCR: 650.0000 mg | EXTENDED_RELEASE_TABLET | Freq: Three times a day (TID) | ORAL | Status: DC | PRN

## 2019-07-05 MED ORDER — SODIUM CHLORIDE 0.9% FLUSH
3.0000 mL | Freq: Two times a day (BID) | INTRAVENOUS | Status: DC
  Administered 2019-07-05: 21:00:00 3 mL via INTRAVENOUS

## 2019-07-05 MED ORDER — LIDOCAINE 2% (20 MG/ML) 5 ML SYRINGE
INTRAMUSCULAR | Status: AC
  Filled 2019-07-05: qty 10

## 2019-07-05 MED ORDER — FENTANYL CITRATE (PF) 250 MCG/5ML IJ SOLN
INTRAMUSCULAR | Status: AC
  Filled 2019-07-05: qty 5

## 2019-07-05 MED ORDER — FENTANYL CITRATE (PF) 250 MCG/5ML IJ SOLN
INTRAMUSCULAR | Status: DC | PRN
  Administered 2019-07-05: 50 ug via INTRAVENOUS
  Administered 2019-07-05: 100 ug via INTRAVENOUS
  Administered 2019-07-05: 50 ug via INTRAVENOUS

## 2019-07-05 MED ORDER — OXYCODONE-ACETAMINOPHEN 5-325 MG PO TABS
1.0000 | ORAL_TABLET | ORAL | Status: DC | PRN
  Administered 2019-07-05 – 2019-07-06 (×4): 2 via ORAL
  Filled 2019-07-05 (×4): qty 2

## 2019-07-05 MED ORDER — SUGAMMADEX SODIUM 200 MG/2ML IV SOLN
INTRAVENOUS | Status: DC | PRN
  Administered 2019-07-05: 140.6 mg via INTRAVENOUS

## 2019-07-05 MED ORDER — LIDOCAINE 2% (20 MG/ML) 5 ML SYRINGE
INTRAMUSCULAR | Status: DC | PRN
  Administered 2019-07-05: 60 mg via INTRAVENOUS

## 2019-07-05 MED ORDER — MENTHOL 3 MG MT LOZG: 1.0000 | LOZENGE | OROMUCOSAL | Status: DC | PRN

## 2019-07-05 MED ORDER — ONDANSETRON HCL 4 MG/2ML IJ SOLN
INTRAMUSCULAR | Status: DC | PRN
  Administered 2019-07-05: 4 mg via INTRAVENOUS

## 2019-07-05 MED ORDER — LOSARTAN POTASSIUM 50 MG PO TABS
50.0000 mg | ORAL_TABLET | Freq: Every day | ORAL | Status: DC
  Administered 2019-07-06: 50 mg via ORAL
  Filled 2019-07-05: qty 1

## 2019-07-05 MED ORDER — ROCURONIUM BROMIDE 10 MG/ML (PF) SYRINGE
PREFILLED_SYRINGE | INTRAVENOUS | Status: DC | PRN
  Administered 2019-07-05: 50 mg via INTRAVENOUS
  Administered 2019-07-05: 20 mg via INTRAVENOUS

## 2019-07-05 MED ORDER — AMLODIPINE BESYLATE 5 MG PO TABS
5.0000 mg | ORAL_TABLET | Freq: Every day | ORAL | Status: DC
  Administered 2019-07-06: 5 mg via ORAL
  Filled 2019-07-05: qty 1

## 2019-07-05 MED ORDER — SENNA 8.6 MG PO TABS
1.0000 | ORAL_TABLET | Freq: Two times a day (BID) | ORAL | Status: DC
  Administered 2019-07-05 – 2019-07-06 (×2): 8.6 mg via ORAL
  Filled 2019-07-05 (×2): qty 1

## 2019-07-05 MED ORDER — FENTANYL CITRATE (PF) 100 MCG/2ML IJ SOLN
INTRAMUSCULAR | Status: AC
  Filled 2019-07-05: qty 2

## 2019-07-05 SURGICAL SUPPLY — 47 items
ADH SKN CLS APL DERMABOND .7 (GAUZE/BANDAGES/DRESSINGS) ×1
ALCOHOL 70% 16 OZ (MISCELLANEOUS) ×3 IMPLANT
APL SKNCLS STERI-STRIP NONHPOA (GAUZE/BANDAGES/DRESSINGS) ×1
BAG DECANTER FOR FLEXI CONT (MISCELLANEOUS) ×3 IMPLANT
BENZOIN TINCTURE PRP APPL 2/3 (GAUZE/BANDAGES/DRESSINGS) ×3 IMPLANT
BIT DRILL NEURO 2X3.1 SFT TUCH (MISCELLANEOUS) ×1 IMPLANT
CANISTER SUCT 3000ML PPV (MISCELLANEOUS) ×3 IMPLANT
COVER WAND RF STERILE (DRAPES) ×3 IMPLANT
DECANTER SPIKE VIAL GLASS SM (MISCELLANEOUS) ×3 IMPLANT
DERMABOND ADVANCED (GAUZE/BANDAGES/DRESSINGS) ×2
DERMABOND ADVANCED .7 DNX12 (GAUZE/BANDAGES/DRESSINGS) ×1 IMPLANT
DISC MOBI-C CERVICAL 15X7X15 (Neuro Prosthesis/Implant) ×2 IMPLANT
DRAPE C-ARM 42X72 X-RAY (DRAPES) ×6 IMPLANT
DRAPE C-ARMOR (DRAPES) ×3 IMPLANT
DRAPE LAPAROTOMY 100X72 PEDS (DRAPES) ×3 IMPLANT
DRAPE MICROSCOPE LEICA (MISCELLANEOUS) IMPLANT
DRAPE POUCH INSTRU U-SHP 10X18 (DRAPES) ×3 IMPLANT
DRILL NEURO 2X3.1 SOFT TOUCH (MISCELLANEOUS) ×3
DURAPREP 6ML APPLICATOR 50/CS (WOUND CARE) ×3 IMPLANT
ELECT REM PT RETURN 9FT ADLT (ELECTROSURGICAL) ×3
ELECTRODE REM PT RTRN 9FT ADLT (ELECTROSURGICAL) ×1 IMPLANT
GAUZE 4X4 16PLY RFD (DISPOSABLE) IMPLANT
GLOVE BIOGEL PI IND STRL 8.5 (GLOVE) ×1 IMPLANT
GLOVE BIOGEL PI INDICATOR 8.5 (GLOVE) ×2
GLOVE ECLIPSE 8.5 STRL (GLOVE) ×3 IMPLANT
GOWN STRL REUS W/ TWL LRG LVL3 (GOWN DISPOSABLE) IMPLANT
GOWN STRL REUS W/ TWL XL LVL3 (GOWN DISPOSABLE) ×1 IMPLANT
GOWN STRL REUS W/TWL 2XL LVL3 (GOWN DISPOSABLE) ×3 IMPLANT
GOWN STRL REUS W/TWL LRG LVL3 (GOWN DISPOSABLE)
GOWN STRL REUS W/TWL XL LVL3 (GOWN DISPOSABLE) ×3
HEMOSTAT POWDER KIT SURGIFOAM (HEMOSTASIS) ×3 IMPLANT
KIT BASIN OR (CUSTOM PROCEDURE TRAY) ×3 IMPLANT
KIT TURNOVER KIT B (KITS) ×3 IMPLANT
NDL SPNL 22GX3.5 QUINCKE BK (NEEDLE) ×1 IMPLANT
NEEDLE HYPO 22GX1.5 SAFETY (NEEDLE) ×3 IMPLANT
NEEDLE SPNL 22GX3.5 QUINCKE BK (NEEDLE) ×3 IMPLANT
NS IRRIG 1000ML POUR BTL (IV SOLUTION) ×3 IMPLANT
PACK LAMINECTOMY NEURO (CUSTOM PROCEDURE TRAY) ×3 IMPLANT
PAD ARMBOARD 7.5X6 YLW CONV (MISCELLANEOUS) ×9 IMPLANT
PATTIES SURGICAL .5 X1 (DISPOSABLE) ×3 IMPLANT
RUBBERBAND STERILE (MISCELLANEOUS) IMPLANT
SPONGE INTESTINAL PEANUT (DISPOSABLE) ×3 IMPLANT
SUT VIC AB 4-0 RB1 18 (SUTURE) ×3 IMPLANT
TAPE CLOTH 4X10 WHT NS (GAUZE/BANDAGES/DRESSINGS) IMPLANT
TOWEL GREEN STERILE (TOWEL DISPOSABLE) ×3 IMPLANT
TOWEL GREEN STERILE FF (TOWEL DISPOSABLE) ×3 IMPLANT
WATER STERILE IRR 1000ML POUR (IV SOLUTION) ×3 IMPLANT

## 2019-07-05 NOTE — Anesthesia Procedure Notes (Signed)
Procedure Name: Intubation Performed by: Kathryne Hitch, CRNA Pre-anesthesia Checklist: Patient identified, Emergency Drugs available, Suction available and Patient being monitored Patient Re-evaluated:Patient Re-evaluated prior to induction Oxygen Delivery Method: Circle system utilized Preoxygenation: Pre-oxygenation with 100% oxygen Induction Type: IV induction Ventilation: Mask ventilation without difficulty Laryngoscope Size: Glidescope and 3 Grade View: Grade I Tube type: Oral Tube size: 7.0 mm Number of attempts: 1 Airway Equipment and Method: Stylet and Oral airway Placement Confirmation: ETT inserted through vocal cords under direct vision,  positive ETCO2 and breath sounds checked- equal and bilateral Secured at: 22 cm Tube secured with: Tape Dental Injury: Teeth and Oropharynx as per pre-operative assessment

## 2019-07-05 NOTE — Anesthesia Preprocedure Evaluation (Signed)
Anesthesia Evaluation  Patient identified by MRN, date of birth, ID band Patient awake    Reviewed: Allergy & Precautions, H&P , NPO status , Patient's Chart, lab work & pertinent test results  Airway Mallampati: II   Neck ROM: limited    Dental   Pulmonary shortness of breath, former smoker,    breath sounds clear to auscultation       Cardiovascular hypertension,  Rhythm:regular Rate:Normal     Neuro/Psych    GI/Hepatic   Endo/Other  Hyperthyroidism   Renal/GU      Musculoskeletal   Abdominal   Peds  Hematology   Anesthesia Other Findings   Reproductive/Obstetrics                             Anesthesia Physical Anesthesia Plan  ASA: II  Anesthesia Plan: General   Post-op Pain Management:    Induction: Intravenous  PONV Risk Score and Plan: 3 and Ondansetron, Dexamethasone, Midazolam and Treatment may vary due to age or medical condition  Airway Management Planned: Oral ETT and Video Laryngoscope Planned  Additional Equipment:   Intra-op Plan:   Post-operative Plan: Extubation in OR  Informed Consent: I have reviewed the patients History and Physical, chart, labs and discussed the procedure including the risks, benefits and alternatives for the proposed anesthesia with the patient or authorized representative who has indicated his/her understanding and acceptance.       Plan Discussed with: CRNA, Anesthesiologist and Surgeon  Anesthesia Plan Comments:         Anesthesia Quick Evaluation

## 2019-07-05 NOTE — H&P (Signed)
  CHIEF COMPLAINT: Neck pain with Lhermitte phenomenon.  HISTORY OF PRESENT ILLNESS: Darlene Griffith is a 68 year old, left-handed individual whom I have seen and treated for significant cervical spondylosis here recently.  She notes that she has had the worst pain that she has ever experienced in her neck a number of months ago and this was related to spondylosis with a broad-based disc protrusion and moderately severe spinal canal stenosis at C6-C7. She responded reasonably well to the prednisone. However, Darlene Griffith tells me that she has continually lived in fear and has experienced on occasion some recurrence of the Lhermitte's phenomenon that she notes is clearly the worst pain that she has ever experienced, despite a number of difficult childbirths and other injuries.  She has not had anything that comes close to what the Lhermitte's phenomenon feels like. She describes the pain as being very intense and radiating from the neck on out.  I discussed with Darlene Griffith the nature of this process and despite the passage of time, she notes that she has to limit her activities for fear of experiencing that discomfort.  Furthermore, in terms of doing any formal kind of exercise, she finds herself severely limited because of the fear of this pain and discomfort.     Today in the office to further her workup, I obtained a lateral flexion-extension film of the cervical spine in addition to oblique views.  The oblique views show that the bony foramina are amply patent at every level, the disc shows the endplates are smooth, and motion between flexion-extension is still quite normal and intact, particularly across the C6-C7 joint.  The MRI that was obtained back in June demonstrates again that there is a broad-based disc protrusion that effaces and flattens the ventral aspect of the spinal cord.  In addition, there is some posterior spondylosis that contributes to significant narrowing and there is some mild foraminal stenosis due  to the broad-based bulge of the disc across the foramen.  EXAMINATION: Today I note that Darlene Griffith demonstrates a limited range of motion cautiously moving 30 degrees left and right.  There is no tenderness in the supraclavicular fossas and her motor strength appears good in the deltoids, biceps, triceps, grips, and intrinsics, albeit reflexes are absent in the biceps and the triceps on both sides.  Her station and gait are intact.  IMPRESSION: Darlene Griffith has a large central disc protrusion at C6-C7.  Though she does not have a florid radiculopathy or florid myelopathy, she has had constant recurrent pain related to any significant motion in the cervical spine.  This has been limiting her activity and she has not been able to participate in any physical therapy because of this.  I have advised that ultimately Darlene Griffith should have this disc surgically extirpated to decompress the spinal canal.  Once the canal is decompressed, I believe that she would be a good candidate for cervical disc arthroplasty at the C6-C7 level.  This would help to maintain the normal range of motion of her neck and hopefully allow her to recover normal function without fear of a Lhermitte phenomenon aggravating her. After some discussion, she is eager to proceed with surgical intervention as she notes that the pain and living in the fear of it has been problematic for her.  We will plan on scheduling her surgery at the earliest convenience.

## 2019-07-05 NOTE — Op Note (Signed)
Date of surgery: 07/05/2019 Preoperative diagnosis: Herniated nucleus pulposus C6-C7 with spondylosis and myelopathy.  Cervical radiculopathy C7. Postoperative diagnosis: Same Procedure: Anterior cervical discectomy and decompression of the common dural tube to C7 nerve roots.  Arthroplasty C6-C7 with mobile-C implant measuring 15 x 15 x 5 mm.,  Fluoroscopic guidance. Surgeon: Kristeen Miss First Assistant: Deri Fuelling, MD Anesthesia: General endotracheal Indications: Darlene Griffith is a 68 year old individual who has had significant progressive weakness in her left arm.  She has a substantial disc herniation at C6-C7 with flattening of the cord and by foraminal stenosis.  Though she has some other mild spondylosis is felt that C6-C7 is creating the worst of her problems including the weakness.  Procedure: The patient was brought to the operating room supine on the stretcher.  After the smooth induction of general endotracheal anesthesia, she was placed in a donut headrest on the operating table with a shoulder rest to help extend her neck.  The shoulders were taped inferiorly and fluoroscopic visualization was used to check the position of C6-C7.  The skin was then prepped with alcohol DuraPrep and draped in a sterile fashion.  Transverse incision was made at the base of the neck on the left side and the dissection was carried down through the platysma.  The plane between the sternocleidomastoid and strap muscles was dissected bluntly until the first disc space was able to be identified.  This was identified as C6-C7 on a fluoroscopic image.  Then by clearing of the soft tissues and the longus coli muscle off of either side a self-retaining retractor was placed into the wound.  The disc space was opened with a #15 blade and a combination of curettes and rongeurs was used to evacuate a substantial quantity of severely degenerated and desiccated disc material.  As the posterior longitudinal ligament was reached  a self-retaining discredit was placed in the wound and the disc was removed from this area and a subligamentous disc herniation was encountered that was substantial this was removed in a piecemeal fashion and the ligament was then opened and dissected to the left into the right.  The foramina were then decompressed of some subligamentous material in addition to a thickened ligament.  In the end both foramina for the C7 nerve root were well decompressed as was the central canal hemostasis was achieved.  The endplates were curettaged smoothed and then a series of trials was used for a disc arthroplasty this started with a 13 x 13 but ultimately was felt that a 15 x 15 x 5 mm tall arthroplasty would fit best into this interval.  This was then placed into the interval using the cassette holder until it was centered in the disc space.  The cassette was then removed and the disc was deployed.  Final radiographs were obtained in AP and lateral projection and identified good position of the construct.  The area was irrigated copiously with antibiotic irrigating solution and the platysma was closed with 3-0 Vicryl interrupted fashion and 4-0 Vicryl was used in a subcuticular skin patient tolerated procedure well was returned to recovery room in stable condition blood loss was estimated less than 50 cc.

## 2019-07-05 NOTE — Progress Notes (Signed)
Patient ID: Darlene Griffith, female   DOB: Feb 10, 1951, 68 y.o.   MRN: [id number] Vital signs are stable Motor function appears intact Swallowing quite well Feels comfortable Plan discharge in the morning

## 2019-07-05 NOTE — Transfer of Care (Signed)
Immediate Anesthesia Transfer of Care Note  Patient: Darlene Griffith  Procedure(s) Performed: Cervical Six-Seven Artificial disc replacement (N/A Spine Cervical)  Patient Location: PACU  Anesthesia Type:General  Level of Consciousness: drowsy and patient cooperative  Airway & Oxygen Therapy: Patient Spontanous Breathing and Patient connected to face mask oxygen  Post-op Assessment: Report given to RN and Post -op Vital signs reviewed and stable  Post vital signs: Reviewed and stable  Last Vitals:  Vitals Value Taken Time  BP 175/99 07/05/19 1408  Temp    Pulse 86 07/05/19 1411  Resp 12 07/05/19 1411  SpO2 98 % 07/05/19 1411  Vitals shown include unvalidated device data.  Last Pain:  Vitals:   07/05/19 0903  TempSrc:   PainSc: 0-No pain         Complications: No apparent anesthesia complications

## 2019-07-06 ENCOUNTER — Encounter (HOSPITAL_COMMUNITY): Payer: Self-pay | Admitting: Neurological Surgery

## 2019-07-06 DIAGNOSIS — M50023 Cervical disc disorder at C6-C7 level with myelopathy: Secondary | ICD-10-CM | POA: Diagnosis not present

## 2019-07-06 DIAGNOSIS — M4722 Other spondylosis with radiculopathy, cervical region: Secondary | ICD-10-CM | POA: Diagnosis not present

## 2019-07-06 DIAGNOSIS — Z79899 Other long term (current) drug therapy: Secondary | ICD-10-CM | POA: Diagnosis not present

## 2019-07-06 DIAGNOSIS — E0590 Thyrotoxicosis, unspecified without thyrotoxic crisis or storm: Secondary | ICD-10-CM | POA: Diagnosis not present

## 2019-07-06 DIAGNOSIS — I10 Essential (primary) hypertension: Secondary | ICD-10-CM | POA: Diagnosis not present

## 2019-07-06 DIAGNOSIS — Z87891 Personal history of nicotine dependence: Secondary | ICD-10-CM | POA: Diagnosis not present

## 2019-07-06 MED ORDER — DIAZEPAM 5 MG PO TABS: 5.0000 mg | ORAL_TABLET | Freq: Four times a day (QID) | ORAL | 0 refills | Status: DC | PRN

## 2019-07-06 MED ORDER — OXYCODONE-ACETAMINOPHEN 5-325 MG PO TABS: 1.0000 | ORAL_TABLET | Freq: Four times a day (QID) | ORAL | 0 refills | Status: DC | PRN

## 2019-07-06 MED ORDER — AMLODIPINE BESYLATE 5 MG PO TABS: 5.0000 mg | ORAL_TABLET | Freq: Every day | ORAL | 3 refills | Status: DC

## 2019-07-06 NOTE — Evaluation (Signed)
Physical Therapy Evaluation Patient Details Name: Darlene Griffith MRN: [id number] DOB: 17-Jul-1951 Today's Date: 07/06/2019   History of Present Illness  Admitted for ACDF;  has a past medical history of Chest pain, Essential hypertension, Hepatic cyst, Hyperthyroidism, Multinodular goiter, Neck pain, chronic, Pulmonary nodules, and Splenic cyst.  Clinical Impression   Patient is s/p above surgery resulting in functional limitations due to the deficits listed below (see PT Problem List). Managing independently prior to admission; presents with neck (incisional) and back (chronic) pain which is effecting mobility and ADLs; RW proved to be useful to her with progressive amb; Patient will benefit from skilled PT to increase their independence and safety with mobility to allow discharge to the venue listed below.       Follow Up Recommendations No PT follow up(Consider Outpt PT for chronic back pain after this bout)    Equipment Recommendations  Rolling walker with 5" wheels;3in1 (PT)    Recommendations for Other Services       Precautions / Restrictions Precautions Precautions: Cervical;Fall Precaution Booklet Issued: Yes (comment) Precaution Comments: issued and reviewed with pt Required Braces or Orthoses: ("no brace needed" order; pt is wondering if she can have a neck brace, notified RN) Restrictions Weight Bearing Restrictions: No      Mobility  Bed Mobility Overal bed mobility: Needs Assistance Bed Mobility: Rolling;Sidelying to Sit Rolling: Supervision Sidelying to sit: HOB elevated;Supervision       General bed mobility comments: pt requires mod verbal cues for performing log roll technique and to reinforce use of technique as pt attempting to sit up prior to completion of education  Transfers Overall transfer level: Needs assistance Equipment used: None Transfers: Sit to/from Stand Sit to Stand: Supervision;Min guard         General transfer comment: for safety  and balance  Ambulation/Gait Ambulation/Gait assistance: Supervision Gait Distance (Feet): 150 Feet Assistive device: Rolling walker (2 wheeled) Gait Pattern/deviations: Step-through pattern;Decreased step length - right;Decreased step length - left Gait velocity: slowed   General Gait Details: Cues to self-monitor for activity tolerance; Good use of RW for support with amb; She indicated the RW is useful to her  Stairs Stairs: Yes Stairs assistance: Min guard Stair Management: One rail Left;Step to pattern;Forwards;Alternating pattern(alternated steps coming down) Number of Stairs: 10 General stair comments: verbal and demo cues for technqiue  Wheelchair Mobility    Modified Rankin (Stroke Patients Only)       Balance Overall balance assessment: Needs assistance Sitting-balance support: Feet supported Sitting balance-Leahy Scale: Good       Standing balance-Leahy Scale: Fair                               Pertinent Vitals/Pain Pain Assessment: Faces Faces Pain Scale: Hurts a little bit Pain Location: incisional; and reported back pain with walking longer distances Pain Descriptors / Indicators: Discomfort;Grimacing Pain Intervention(s): Monitored during session(Used RW to take pressure off of back)    Home Living Family/patient expects to be discharged to:: Private residence Living Arrangements: Children Available Help at Discharge: Family;Available 24 hours/day Type of Home: House Home Access: Stairs to enter Entrance Stairs-Rails: Psychiatric nurse of Steps: 3 Home Layout: One level Home Equipment: None      Prior Function Level of Independence: Independent         Comments: but reports increased pain/difficulty with ambulating longer distances     Hand Dominance   Dominant Hand: Left  Extremity/Trunk Assessment   Upper Extremity Assessment Upper Extremity Assessment: Defer to OT evaluation    Lower Extremity  Assessment Lower Extremity Assessment: Generalized weakness    Cervical / Trunk Assessment Cervical / Trunk Assessment: Other exceptions Cervical / Trunk Exceptions: s/p cervical sx  Communication   Communication: No difficulties  Cognition Arousal/Alertness: Awake/alert Behavior During Therapy: WFL for tasks assessed/performed Overall Cognitive Status: Within Functional Limits for tasks assessed                                 General Comments: pt very chatty, requires redirection      General Comments      Exercises     Assessment/Plan    PT Assessment Patient needs continued PT services  PT Problem List Decreased strength;Decreased activity tolerance;Decreased mobility;Decreased knowledge of use of DME;Decreased knowledge of precautions       PT Treatment Interventions DME instruction;Gait training;Stair training;Functional mobility training;Therapeutic activities;Therapeutic exercise;Balance training;Patient/family education    PT Goals (Current goals can be found in the Care Plan section)  Acute Rehab PT Goals Patient Stated Goal: regain independence, no more pain PT Goal Formulation: With patient Time For Goal Achievement: 07/13/19 Potential to Achieve Goals: Good    Frequency Min 5X/week   Barriers to discharge        Co-evaluation               AM-PAC PT "6 Clicks" Mobility  Outcome Measure Help needed turning from your back to your side while in a flat bed without using bedrails?: A Little Help needed moving from lying on your back to sitting on the side of a flat bed without using bedrails?: None Help needed moving to and from a bed to a chair (including a wheelchair)?: None Help needed standing up from a chair using your arms (e.g., wheelchair or bedside chair)?: None Help needed to walk in hospital room?: None Help needed climbing 3-5 steps with a railing? : A Little 6 Click Score: 22    End of Session Equipment Utilized During  Treatment: Gait belt Activity Tolerance: Patient tolerated treatment well Patient left: in bed;with call bell/phone within reach Nurse Communication: Mobility status PT Visit Diagnosis: Unsteadiness on feet (R26.81);Other abnormalities of gait and mobility (R26.89);Pain Pain - part of body: (Neck (incisional pain), Back (chronic pain))    Time: [id number] PT Time Calculation (min) (ACUTE ONLY): 21 min   Charges:   PT Evaluation $PT Eval Low Complexity: [place], PT  Acute Rehabilitation Services Pager [phone] Office [place] 07/06/2019, 10:47 AM

## 2019-07-06 NOTE — Discharge Instructions (Signed)

## 2019-07-06 NOTE — Progress Notes (Signed)
Pt given D/C instructions with verbal understanding. Rx's were sent to pharmacy by MD. Pt's incision is clean and dry with no sign of infection. Pt's IV was removed prior to D/C. Pt received RW and 3-n-1 per MD order. Pt D/C'd home via wheelchair per MD order. Pt is stable @ D/C and has no other needs at this time. Holli Humbles, RN

## 2019-07-06 NOTE — Care Management CC44 (Signed)
Condition Code 44 Documentation Completed  Patient Details  Name: YVONNDA STERNER MRN: [id number] Date of Birth: 05-31-1951   Condition Code 44 given:  Yes Patient signature on Condition Code 44 notice:  Yes Documentation of 2 MD's agreement:  Yes Code 44 added to claim:  Yes    Sharin Mons, RN 07/06/2019, 10:18 AM

## 2019-07-06 NOTE — Care Management Obs Status (Signed)
[facility] NOTIFICATION   Patient Details  Name: Darlene Griffith MRN: [id number] Date of Birth: 1951/04/04   Medicare Observation Status Notification Given:  Yes    Sharin Mons, RN 07/06/2019, 10:18 AM

## 2019-07-06 NOTE — Anesthesia Postprocedure Evaluation (Signed)
Anesthesia Post Note  Patient: Darlene Griffith  Procedure(s) Performed: Cervical Six-Seven Artificial disc replacement (N/A Spine Cervical)     Patient location during evaluation: PACU Anesthesia Type: General Level of consciousness: awake and alert Pain management: pain level controlled Vital Signs Assessment: post-procedure vital signs reviewed and stable Respiratory status: spontaneous breathing, nonlabored ventilation, respiratory function stable and patient connected to nasal cannula oxygen Cardiovascular status: blood pressure returned to baseline and stable Postop Assessment: no apparent nausea or vomiting Anesthetic complications: no    Last Vitals:  Vitals:   07/06/19 0350 07/06/19 0758  BP: 122/78 136/76  Pulse: 86 91  Resp: 20 16  Temp: 36.7 C 36.8 C  SpO2: 97% 99%    Last Pain:  Vitals:   07/06/19 0758  TempSrc: Oral  PainSc:                  [place] S

## 2019-07-06 NOTE — Evaluation (Signed)
Occupational Therapy Evaluation Patient Details Name: Darlene Griffith MRN: [id number] DOB: [birth year] Today's Date: 07/06/2019    History of Present Illness Admitted for ACDF;  has a past medical history of Chest pain, Essential hypertension, Hepatic cyst, Hyperthyroidism, Multinodular goiter, Neck pain, chronic, Pulmonary nodules, and Splenic cyst.   Clinical Impression   This 68 y/o female presents with the above. PTA pt reports independence with ADL and mobility, though reports only ambulating short distances due to pain. Pt currently requiring minguard assist for ADL tasks and functional mobility. Pt requires frequent redirection to task as she is easily distracted/tangential in speech. Pt reports plans to return home with daughter who is able to provide 24hr supervision and assist PRN. Educated pt re: cervical precautions, safety and compensatory techniques for completing ADL and functional transfers. Will continue to follow auctely to further progress pt towards PLOF.     Follow Up Recommendations  No OT follow up;Supervision/Assistance - 24 hour    Equipment Recommendations  3 in 1 bedside commode           Precautions / Restrictions Precautions Precautions: Cervical;Fall Precaution Booklet Issued: Yes (comment) Precaution Comments: issued and reviewed with pt Required Braces or Orthoses: ("no brace needed" order) Restrictions Weight Bearing Restrictions: No      Mobility Bed Mobility Overal bed mobility: Needs Assistance Bed Mobility: Rolling;Sidelying to Sit Rolling: Supervision Sidelying to sit: HOB elevated;Supervision       General bed mobility comments: pt requires mod verbal cues for performing log roll technique and to reinforce use of technique as pt attempting to sit up prior to completion of education  Transfers Overall transfer level: Needs assistance Equipment used: None Transfers: Sit to/from Stand Sit to Stand: Supervision;Min guard          General transfer comment: for safety and balance    Balance Overall balance assessment: Needs assistance Sitting-balance support: Feet supported Sitting balance-Leahy Scale: Good       Standing balance-Leahy Scale: Fair                             ADL either performed or assessed with clinical judgement   ADL Overall ADL's : Needs assistance/impaired Eating/Feeding: Modified independent;Sitting   Grooming: Supervision/safety;Standing   Upper Body Bathing: Supervision/ safety;Sitting   Lower Body Bathing: Supervison/ safety;Min guard;Sit to/from stand   Upper Body Dressing : Min guard;Set up;Sitting   Lower Body Dressing: Min guard;Sit to/from stand Lower Body Dressing Details (indicate cue type and reason): pt able to perform figure 4 technique without difficulty Toilet Transfer: Min guard;Ambulation Toilet Transfer Details (indicate cue type and reason): simulated via transfer from EOB - handoff to PT for continued mobility Toileting- Clothing Manipulation and Hygiene: Min guard;Sit to/from Nurse, children's Details (indicate cue type and reason): educated pt on use of 3:1 as shower seat for increased safety and adhering to precautions Functional mobility during ADLs: Min guard General ADL Comments: pt easily distracted and difficult to remain on task, overall requires minguard assist for ADL     Vision         Perception     Praxis      Pertinent Vitals/Pain Pain Assessment: Faces Faces Pain Scale: Hurts a little bit Pain Location: incisional Pain Descriptors / Indicators: Discomfort;Grimacing Pain Intervention(s): Monitored during session;Limited activity within patient's tolerance     Hand Dominance Left   Extremity/Trunk Assessment Upper Extremity Assessment Upper Extremity Assessment: Overall  WFL for tasks assessed   Lower Extremity Assessment Lower Extremity Assessment: Defer to PT evaluation   Cervical / Trunk  Assessment Cervical / Trunk Assessment: Other exceptions Cervical / Trunk Exceptions: s/p cervical sx   Communication Communication Communication: No difficulties   Cognition Arousal/Alertness: Awake/alert Behavior During Therapy: WFL for tasks assessed/performed Overall Cognitive Status: Within Functional Limits for tasks assessed                                 General Comments: pt very chatty, requires redirection   General Comments       Exercises     Shoulder Instructions      Home Living Family/patient expects to be discharged to:: Private residence Living Arrangements: Children Available Help at Discharge: Family;Available 24 hours/day Type of Home: House Home Access: Stairs to enter [employer] of Steps: 3 Entrance Stairs-Rails: Right;Left Home Layout: One level     Bathroom Shower/Tub: Tub/shower unit;Walk-in shower   Bathroom Toilet: Standard     Home Equipment: None          Prior Functioning/Environment Level of Independence: Independent        Comments: but reports increased pain/difficulty with ambulating longer distances        OT Problem List: Decreased range of motion;Decreased strength;Impaired balance (sitting and/or standing);Decreased knowledge of precautions;Decreased knowledge of use of DME or AE;Pain      OT Treatment/Interventions: Self-care/ADL training;Therapeutic exercise;Energy conservation;DME and/or AE instruction;Therapeutic activities;Patient/family education;Balance training    OT Goals(Current goals can be found in the care plan section) Acute Rehab OT Goals Patient Stated Goal: regain independence, no more pain OT Goal Formulation: With patient Time For Goal Achievement: 07/20/19 Potential to Achieve Goals: Good  OT Frequency: Min 2X/week   Barriers to D/C:            Co-evaluation              AM-PAC OT "6 Clicks" Daily Activity     Outcome Measure Help from another person  eating meals?: None Help from another person taking care of personal grooming?: None Help from another person toileting, which includes using toliet, bedpan, or urinal?: None Help from another person bathing (including washing, rinsing, drying)?: A Little Help from another person to put on and taking off regular upper body clothing?: None Help from another person to put on and taking off regular lower body clothing?: A Little 6 Click Score: 22   End of Session Nurse Communication: Mobility status  Activity Tolerance: Patient tolerated treatment well Patient left: Other (comment)(handoff to PT to continue session)  OT Visit Diagnosis: Other abnormalities of gait and mobility (R26.89)                Time: [id number] OT Time Calculation (min): 20 min Charges:  OT General Charges $OT Visit: 1 Visit OT Evaluation $OT Eval Low Complexity: [place], OT Supplemental Rehabilitation Services Pager [phone] Office [phone]  Raymondo Band 07/06/2019, 9:32 AM

## 2019-07-06 NOTE — Discharge Summary (Signed)
Physician Discharge Summary  Patient ID: Darlene Griffith MRN: [id number] DOB/AGE: 03-02-1951 68 y.o.  Admit date: 07/05/2019 Discharge date: 07/06/2019  Admission Diagnoses: Cervical spondylosis with myelopathy and radiculopathy C6-C7  Discharge Diagnoses: Cervical spondylosis with myelopathy and radiculopathy C6-C7 Active Problems:   Cervical spondylosis with myelopathy and radiculopathy   Discharged Condition: good  Hospital Course: Vital signs have been stable patient tolerated surgery well  Consults: None  Significant Diagnostic Studies: None  Treatments: surgery: Anterior decompression C6-C7 arthroplasty with Mobi-C  Discharge Exam: Blood pressure 136/76, pulse 91, temperature 98.2 F (36.8 C), temperature source Oral, resp. rate 16, height 5' (1.524 m), weight 70.3 kg, SpO2 99 %. Incision is clean and dry Station and gait are intact  Disposition: Discharge disposition: 01-Home or Self Care       Discharge Instructions    Call MD for:  redness, tenderness, or signs of infection (pain, swelling, redness, odor or green/yellow discharge around incision site)   Complete by: As directed    Call MD for:  severe uncontrolled pain   Complete by: As directed    Call MD for:  temperature >100.4   Complete by: As directed    Diet - low sodium heart healthy   Complete by: As directed    Discharge instructions   Complete by: As directed    Okay to shower. Do not apply salves or appointments to incision. No heavy lifting with the upper extremities greater than 15 pounds. May resume driving when not requiring pain medication and patient feels comfortable with doing so.   Incentive spirometry RT   Complete by: As directed    Increase activity slowly   Complete by: As directed      Allergies as of 07/06/2019   No Known Allergies     Medication List    TAKE these medications   amLODipine 5 MG tablet Commonly known as: NORVASC Take 1 tablet (5 mg total) by mouth daily.    diazepam 5 MG tablet Commonly known as: Valium Take 1 tablet (5 mg total) by mouth every 6 (six) hours as needed for muscle spasms.   oxyCODONE-acetaminophen 5-325 MG tablet Commonly known as: PERCOCET/ROXICET Take 1-2 tablets by mouth every 6 (six) hours as needed for moderate pain or severe pain.        Signed: Earleen Newport 07/06/2019, 9:36 AM

## 2019-07-27 DIAGNOSIS — M5020 Other cervical disc displacement, unspecified cervical region: Secondary | ICD-10-CM | POA: Diagnosis not present

## 2019-12-02 ENCOUNTER — Ambulatory Visit (INDEPENDENT_AMBULATORY_CARE_PROVIDER_SITE_OTHER): Payer: Medicare Other | Admitting: Family Medicine

## 2019-12-02 ENCOUNTER — Other Ambulatory Visit: Payer: Self-pay

## 2019-12-02 ENCOUNTER — Encounter: Payer: Self-pay | Admitting: Family Medicine

## 2019-12-02 VITALS — BP 130/80 | HR 88 | Resp 16 | Ht 60.0 in | Wt 157.0 lb

## 2019-12-02 DIAGNOSIS — I10 Essential (primary) hypertension: Secondary | ICD-10-CM | POA: Diagnosis not present

## 2019-12-02 DIAGNOSIS — Z1211 Encounter for screening for malignant neoplasm of colon: Secondary | ICD-10-CM | POA: Diagnosis not present

## 2019-12-02 DIAGNOSIS — E0590 Thyrotoxicosis, unspecified without thyrotoxic crisis or storm: Secondary | ICD-10-CM | POA: Diagnosis not present

## 2019-12-02 DIAGNOSIS — R5383 Other fatigue: Secondary | ICD-10-CM | POA: Diagnosis not present

## 2019-12-02 DIAGNOSIS — Z9189 Other specified personal risk factors, not elsewhere classified: Secondary | ICD-10-CM | POA: Diagnosis not present

## 2019-12-02 DIAGNOSIS — Z1159 Encounter for screening for other viral diseases: Secondary | ICD-10-CM

## 2019-12-02 DIAGNOSIS — N3941 Urge incontinence: Secondary | ICD-10-CM

## 2019-12-02 DIAGNOSIS — G4733 Obstructive sleep apnea (adult) (pediatric): Secondary | ICD-10-CM | POA: Diagnosis not present

## 2019-12-02 DIAGNOSIS — E042 Nontoxic multinodular goiter: Secondary | ICD-10-CM

## 2019-12-02 LAB — URINALYSIS, ROUTINE W REFLEX MICROSCOPIC
Bilirubin Urine: NEGATIVE
Ketones, ur: NEGATIVE
Leukocytes,Ua: NEGATIVE
Nitrite: NEGATIVE
RBC / HPF: NONE SEEN (ref 0–?)
Specific Gravity, Urine: 1.025 (ref 1.000–1.030)
Total Protein, Urine: NEGATIVE
Urine Glucose: NEGATIVE
Urobilinogen, UA: 0.2 (ref 0.0–1.0)
pH: 6 (ref 5.0–8.0)

## 2019-12-02 LAB — TSH: TSH: 0.26 u[IU]/mL — ABNORMAL LOW (ref 0.35–4.50)

## 2019-12-02 LAB — T4, FREE: Free T4: 0.82 ng/dL (ref 0.60–1.60)

## 2019-12-02 MED ORDER — OXYBUTYNIN CHLORIDE ER 5 MG PO TB24: 5.0000 mg | ORAL_TABLET | Freq: Every day | ORAL | 3 refills | Status: DC

## 2019-12-02 NOTE — Patient Instructions (Signed)
A few things to remember from today's visit:   Colon cancer screening  Urge incontinence of urine  Multinodular goiter  Essential hypertension  OSA (obstructive sleep apnea)  Fatigue, unspecified type  Oxybutynin daily for urine frequency. Take amlodipine daily at night. Sleep study will be arrnaged.  It is a common symptom associated with multiple factors: psychologic,medications, systemic illness, sleep disorders,infections, and unknown causes. Some work-up can be done to evaluate for common causes as thyroid disease,anemia,diabetes, or abnormalities in calcium,potassium,or sodium. Regular physical activity as tolerated and a healthy diet is usually might help and usually recommended for chronic fatigue.   Please be sure medication list is accurate. If a new problem present, please set up appointment sooner than planned today.

## 2019-12-02 NOTE — Progress Notes (Signed)
HPI:  Chief Complaint  Patient presents with  . Follow-up    Darlene Griffith is a 69 y.o. female, who is here today for chronic disease management. She was last seen on 03/22/2019. No new problems since her last visit.  -Hypertension: She has not been taking amlodipine daily because it causes drowsiness.  She usually takes amlodipine in the morning.  Lab Results  Component Value Date   CREATININE 0.72 06/30/2019   BUN 12 06/30/2019   NA 140 06/30/2019   K 5.2 (H) 06/30/2019   CL 107 06/30/2019   CO2 24 06/30/2019   She is on Amlodipine 5 mg daily. Home BP 160-170/110.  Last time she took med 2 days ago. Negative for severe/frequent headache, visual changes, chest pain, dyspnea, palpitation, focal weakness, or edema.  -She is complaining about "a lot" of neck discomfort since surgery in 06/2019.  She is currently following with neurosurgeon.  Negative for new associated symptoms.  -Odorous urine, "disgusting" for about 2 months. Urinary frequency and urgency with occasional incontinence for a while. She is requesting a note for work,so she is allowed to use the bathroom more frequent.  No dysuria, gross hematuria, or decreased urine output. Cranberry juice helps some.  No abdominal pain,nausea,or vomiting.  She is also requesting referral for colonoscopy. "Once in a while" "little blood" on tissue.  -"No energy", she feels "exhausted. A friend gave her Adderall and it really help with fatigue,she felt energetic, no side effects reported.  Problem has been going on for 4 months at least. Sleep:Goes to bed at 6:30 pm, 1 am wakes up and back to sleep around 3 am. She has not tried OTC sleep aids.  Sleep study was done before and recommended to repeat study, it was not done. She denies falling asleep while driving.  Lab Results  Component Value Date   WBC 7.9 06/30/2019   HGB 13.6 06/30/2019   HCT 42.3 06/30/2019   MCV 106.3 (H) 06/30/2019   PLT 288  06/30/2019   -Multinodular goiter: Follows with [facility]ocrinologist at [facility], she has not followed in a while. Lab Results  Component Value Date   TSH 0.092 (L) 05/21/2019   Review of Systems  Constitutional: Negative for activity change, appetite change and fever.  HENT: Negative for mouth sores, nosebleeds and sore throat.   Respiratory: Negative for cough and wheezing.   Gastrointestinal: Negative for abdominal pain, nausea and vomiting.       Negative for changes in bowel habits.  Endocrine: Negative for cold intolerance and heat intolerance.  Genitourinary: Negative for difficulty urinating, vaginal bleeding and vaginal discharge.  Musculoskeletal: Positive for arthralgias. Negative for gait problem.  Skin: Negative for rash and wound.  Neurological: Negative for syncope and facial asymmetry.  Psychiatric/Behavioral: Negative for confusion. The patient is nervous/anxious.   Rest see pertinent positives and negatives per HPI.  Current Outpatient Medications on File Prior to Visit  Medication Sig Dispense Refill  . diazepam (VALIUM) 5 MG tablet Take 1 tablet (5 mg total) by mouth every 6 (six) hours as needed for muscle spasms. 30 tablet 0  . oxyCODONE-acetaminophen (PERCOCET/ROXICET) 5-325 MG tablet Take 1-2 tablets by mouth every 6 (six) hours as needed for moderate pain or severe pain. 40 tablet 0  . amLODipine (NORVASC) 5 MG tablet Take 1 tablet (5 mg total) by mouth daily. 30 tablet 3   No current facility-administered medications on file prior to visit.    Past Medical  History:  Diagnosis Date  . Chest pain    a. 01/2015 Echo: Nl LV fxn, Gr 1 DD, triv AI, mild MR.  . Essential hypertension   . Hepatic cyst    a. noted on CT 01/2015.  [name] Hyperthyroidism    GOING TO [facility] FOR SECOND OPINION  . Multinodular goiter    a. 01/2015 CT chest: multinodular goidter w/ substernal extension of the left lobe of the thyroid assoc w/ rightward deviation of tracheal air column.  . Neck pain,  chronic   . Pulmonary nodules    a. 01/2015 CT Chest: RLL ~ 67mm subpleural nodule - rec f/u in 6-12 mos.  [name] Splenic cyst    a. noted on CT 01/2015.   No Known Allergies  Social History   Socioeconomic History  . Marital status: Single    Spouse name: Not on file  . Number of children: Not on file  . Years of education: Not on file  . Highest education level: Not on file  Occupational History  . Occupation: retired  Tobacco Use  . Smoking status: Former Research scientist (life sciences)  . Smokeless tobacco: Never Used  . Tobacco comment: " long time ago"  Substance and Sexual Activity  . Alcohol use: Yes    Alcohol/week: 0.0 standard drinks    Comment: rare  . Drug use: No  . Sexual activity: Not on file  Other Topics Concern  . Not on file  Social History Narrative  . Not on file   Social Determinants of Health   Financial Resource Strain:   . Difficulty of Paying Living Expenses: Not on file  Food Insecurity:   . Worried About Charity fundraiser in the Last Year: Not on file  . Ran Out of Food in the Last Year: Not on file  Transportation Needs:   . Lack of Transportation (Medical): Not on file  . Lack of Transportation (Non-Medical): Not on file  Physical Activity:   . Days of Exercise per Week: Not on file  . Minutes of Exercise per Session: Not on file  Stress:   . Feeling of Stress : Not on file  Social Connections:   . Frequency of Communication with Friends and Family: Not on file  . Frequency of Social Gatherings with Friends and Family: Not on file  . Attends Religious Services: Not on file  . Active Member of Clubs or Organizations: Not on file  . Attends Archivist Meetings: Not on file  . Marital Status: Not on file    Vitals:   12/02/19 1017  BP: 130/80  Pulse: 88  Resp: 16  SpO2: 99%   Body mass index is 30.66 kg/m.   Physical Exam  Nursing note and vitals reviewed. Constitutional: She is oriented to person, place, and time. She appears  well-developed. No distress.  HENT:  Head: Normocephalic and atraumatic.  Mouth/Throat: Oropharynx is clear and moist and mucous membranes are normal. She has dentures.  Eyes: Pupils are equal, round, and reactive to light. Conjunctivae are normal.  Neck: Thyromegaly present.  Cardiovascular: Normal rate and regular rhythm.  Murmur (SEM I/VI  RUSB) heard. Pulses:      Dorsalis pedis pulses are 2+ on the right side and 2+ on the left side.  Respiratory: Effort normal and breath sounds normal. No respiratory distress.  GI: Soft. She exhibits no mass. There is no hepatomegaly. There is no abdominal tenderness.  Musculoskeletal:        General: Edema (Trace pitting  LE edema,bilateral.) present.  Lymphadenopathy:    She has no cervical adenopathy.  Neurological: She is alert and oriented to person, place, and time. She has normal strength. No cranial nerve deficit. Gait normal.  Skin: Skin is warm. No rash noted. No erythema.  Psychiatric: She has a normal mood and affect.  Well groomed, good eye contact.   ASSESSMENT AND PLAN:  Darlene Griffith was seen today for follow-up.  Diagnoses and all orders for this visit: Orders Placed This Encounter  Procedures  . Urinalysis, Routine w reflex microscopic  . T4, free  . TSH  . Ambulatory referral to Pulmonology  . Ambulatory referral to Gastroenterology   Lab Results  Component Value Date   TSH 0.26 (L) 12/02/2019    Fatigue, unspecified type We discussed possible etiologies: Systemic illness, immunologic,endocrinology,sleep disorder, psychiatric/psychologic, infectious,medications side effects, and idiopathic.  Examination today does not suggest a serious process. Healthy diet and regular physical activity may help.  Explained that Adderall is not to treat fatigue.Also educated about guidelines in regard to prescription of controlled medications like Adderall,so her friend should not be sharing Rx.  Colon cancer screening GI referral  placed.  Urge incontinence of urine After discussion of pharmacologic treatment options, she agrees with trying oxybutynin 5 mg daily.  We discussed some side effects. Letter for work given.  -     Urinalysis, Routine w reflex microscopic -     oxybutynin (DITROPAN-XL) 5 MG 24 hr tablet; Take 1 tablet (5 mg total) by mouth at bedtime.  Multinodular goiter Following with endocrinologist, she needs to arrange f/u appt. I do not have copy of past visits. Further recommendation will be given according to TSH results.  Essential hypertension Elevated BP readings at home. We discussed possible complications of elevated BP. Recommend taking amlodipine daily and at bedtime. Low-salt diet.  OSA (obstructive sleep apnea) We discussed symptoms and possible complications of OSA. Wt loss will help.  She is going to need another sleep study,referral placed.  -     Ambulatory referral to Pulmonology   Return in about 3 months (around 02/29/2020) for HTN,urine incontinence.    G. Martinique, MD  [facility]. [place] office.

## 2019-12-05 ENCOUNTER — Encounter: Payer: Self-pay | Admitting: Gastroenterology

## 2019-12-05 ENCOUNTER — Other Ambulatory Visit: Payer: Self-pay

## 2019-12-05 DIAGNOSIS — E042 Nontoxic multinodular goiter: Secondary | ICD-10-CM

## 2019-12-15 ENCOUNTER — Other Ambulatory Visit: Payer: Self-pay

## 2019-12-16 ENCOUNTER — Other Ambulatory Visit (INDEPENDENT_AMBULATORY_CARE_PROVIDER_SITE_OTHER): Payer: Medicare Other

## 2019-12-16 ENCOUNTER — Telehealth: Payer: Self-pay | Admitting: Family Medicine

## 2019-12-16 DIAGNOSIS — I10 Essential (primary) hypertension: Secondary | ICD-10-CM

## 2019-12-16 DIAGNOSIS — E0590 Thyrotoxicosis, unspecified without thyrotoxic crisis or storm: Secondary | ICD-10-CM

## 2019-12-16 DIAGNOSIS — Z1159 Encounter for screening for other viral diseases: Secondary | ICD-10-CM

## 2019-12-16 DIAGNOSIS — Z9189 Other specified personal risk factors, not elsewhere classified: Secondary | ICD-10-CM | POA: Diagnosis not present

## 2019-12-16 LAB — BASIC METABOLIC PANEL
BUN: 11 mg/dL (ref 6–23)
CO2: 29 mEq/L (ref 19–32)
Calcium: 9.7 mg/dL (ref 8.4–10.5)
Chloride: 104 mEq/L (ref 96–112)
Creatinine, Ser: 0.52 mg/dL (ref 0.40–1.20)
GFR: 141.55 mL/min (ref >60.00)
Glucose, Bld: 84 mg/dL (ref 70–99)
Potassium: 3.7 mEq/L (ref 3.5–5.1)
Sodium: 143 mEq/L (ref 135–145)

## 2019-12-16 LAB — T3, FREE: T3, Free: 3.6 pg/mL (ref 2.3–4.2)

## 2019-12-16 NOTE — Telephone Encounter (Signed)
Pt came in and dropped off a [employer] Form with a letter that the provider had given her.  Pt state that her job prefer to have the information on their form so she was wanting to know if the information could be transferred over to the form where the physician fills out.  Upon completion pt would like for it to be faxed to the [phone] [id number] Attn: [place].  Form placed in providers folder for completion.

## 2019-12-17 LAB — THYROTROPIN RECEPTOR AUTOABS: Thyrotropin Receptor Ab: <1.1 IU/L (ref 0.00–1.75)

## 2019-12-19 LAB — THYROGLOBULIN ANTIBODY: Thyroglobulin Ab: <1 IU/mL (ref <=1)

## 2019-12-19 LAB — THYROID PEROXIDASE ANTIBODY: Thyroperoxidase Ab SerPl-aCnc: <1 IU/mL (ref <9)

## 2019-12-19 LAB — HEPATITIS C ANTIBODY
Hepatitis C Ab: NONREACTIVE
SIGNAL TO CUT-OFF: 0.01 (ref <1.00)

## 2019-12-21 NOTE — Telephone Encounter (Signed)
Forms were filled out, signed by pcp & faxed. Copy sent to scan for pt's chart.

## 2019-12-23 ENCOUNTER — Encounter: Payer: Self-pay | Admitting: *Deleted

## 2019-12-30 ENCOUNTER — Encounter: Payer: Self-pay | Admitting: Nurse Practitioner

## 2019-12-30 ENCOUNTER — Ambulatory Visit: Payer: Medicare Other | Admitting: Physician Assistant

## 2020-01-06 ENCOUNTER — Encounter: Payer: Self-pay | Admitting: Nurse Practitioner

## 2020-01-06 ENCOUNTER — Ambulatory Visit (INDEPENDENT_AMBULATORY_CARE_PROVIDER_SITE_OTHER): Payer: Medicare Other | Admitting: Nurse Practitioner

## 2020-01-06 ENCOUNTER — Other Ambulatory Visit: Payer: Self-pay

## 2020-01-06 ENCOUNTER — Telehealth: Payer: Self-pay | Admitting: General Surgery

## 2020-01-06 VITALS — BP 132/70 | HR 64 | Temp 98.2°F | Ht 60.0 in | Wt 158.6 lb

## 2020-01-06 DIAGNOSIS — R1310 Dysphagia, unspecified: Secondary | ICD-10-CM | POA: Diagnosis not present

## 2020-01-06 DIAGNOSIS — K625 Hemorrhage of anus and rectum: Secondary | ICD-10-CM

## 2020-01-06 NOTE — Patient Instructions (Signed)
If you are age 69 or older, your body mass index should be between 23-30. Your Body mass index is 30.97 kg/m. If this is out of the aforementioned range listed, please consider follow up with your Primary Care Provider.  If you are age 81 or younger, your body mass index should be between 19-25. Your Body mass index is 30.97 kg/m. If this is out of the aformentioned range listed, please consider follow up with your Primary Care Provider.   You were originally scheduled for 01/13/2020 to have a colonoscopy. You have opted to have an EGD at the same time. We are unable to accommodate you on 01/13/2020, instructions are given for your procedure so that you may place the date and time for your prep.  If you have not heard from our office by 1 week please contact us at [phone].  Due to recent changes in healthcare laws, you may see the results of your imaging and laboratory studies on MyChart before your provider has had a chance to review them.  We understand that in some cases there may be results that are confusing or concerning to you. Not all laboratory results come back in the same time frame and the provider may be waiting for multiple results in order to interpret others.  Please give [place] 48 hours in order for your provider to thoroughly review all the results before contacting the office for clarification of your results.   Thank you for choosing [facility] Noralyn Pick, CRNP

## 2020-01-06 NOTE — Telephone Encounter (Signed)
-----   Message from Yetta Flock, MD sent at 01/06/2020  1:09 PM EDT ----- Regarding: RE: Colonoscopy That's fine. Since she is 800 AM next Friday let's just keep it that day and move it up to 730 and keep her on the schedule for a double at that time, it's fine with me. She is seeing colleen today to discuss this. Thanks  Adriana Reams ----- Message ----- From: Letta Pate, CMA Sent: 01/06/2020  10:24 AM EDT To: Yetta Flock, MD Subject: Colonoscopy                                    We saw a patient this morning who was scheduled for a direct colonoscopy on 01/13/2020@ 8:00. It was decided she also needed to have an EGD. The patient is willing to reschedule this appointment to another Friday but the only date I see on your schedule is 02/17/2020 @3 :30.  Honestly, I am unsure of how you received this patient unless it was during a supervising Dr day. Would it be possible to use this slot? Or should I move this patient to another physician.

## 2020-01-06 NOTE — Telephone Encounter (Signed)
Tried to contact the patient to advise her that Dr Havery Moros stated for her to be at the office for a double starting at 7:30 on 01/13/2020. No answer on mobile left a detailed voicemail,

## 2020-01-06 NOTE — Progress Notes (Signed)
01/06/2020 BLESSING ASKAR [id number] 11/10/1950   CHIEF COMPLAINT:  Difficulty swallowing, schedule an EGD   HISTORY OF PRESENT ILLNESS:  Darlene Griffith is a 69 year old female with a past medical history of anxiety, depression, hypercholesterolemia, hypertension, hypothyroidism, thyroid nodules and chronic headaches. S/P anterior cervical discectomy and decompression and arthroplasty C6-7 06/2019. She presents today to schedule and EGD and colonoscopy. She has occasional dysphagia, feels choked when swallowing solid foods which occurs once monthly.  She cannot recall which foods get stuck. She drinks water and the food passes down the esophagus. She reports her swallowing difficulty started prior to her cervical surgery. No upper abdominal pain. No upper or lower abdominal pain. She passes a brown hard or normal stool daily. Infrequent loose stool. If she drinks coffee or Coke in the morning she passes a loose stool. She occasionally sees bright red blood on the toilet tissue and on the stool once monthly for a few years. No associated anal/rectal pain. She is scheduled to have a screening colonoscopy with Dr. Havery Moros 01/13/2020. She wishes to schedule and EGD at the time of her colonoscopy. No family history of esophageal, gastric or colorectal cancer. No other complaints.   Past Medical History:  Diagnosis Date  . Chest pain    a. 01/2015 Echo: Nl LV fxn, Gr 1 DD, triv AI, mild MR.  . Essential hypertension   . Hepatic cyst    a. noted on CT 01/2015.  [name] Hyperthyroidism    GOING TO [facility] FOR SECOND OPINION  . Multinodular goiter    a. 01/2015 CT chest: multinodular goidter w/ substernal extension of the left lobe of the thyroid assoc w/ rightward deviation of tracheal air column.  . Neck pain, chronic   . Pulmonary nodules    a. 01/2015 CT Chest: RLL ~ 57mm subpleural nodule - rec f/u in 6-12 mos.  [name] Splenic cyst    a. noted on CT 01/2015.   Past Surgical History:  Procedure Laterality  Date  . CERVICAL DISC ARTHROPLASTY N/A 07/05/2019   Procedure: Cervical Six-Seven Artificial disc replacement;  Surgeon: Kristeen Miss, MD;  Location: [facility];  Service: Neurosurgery;  Laterality: N/A;  Cervical Six-Seven Artificial disc replacement  . CESAREAN SECTION    . TONSILLECTOMY     AROUND 5-6 YRS OLD    Family History: Mother age 73 with tyroid disease and HTN. Father lung cancer. MGM died from MI. MGF died from CVA.   Social History: Smokes cigarettes every blue moon, 1 pack last a few months. No alcohol. No drug use.    Current Outpatient Medications on File Prior to Visit  Medication Sig Dispense Refill  . amLODipine (NORVASC) 5 MG tablet Take 1 tablet (5 mg total) by mouth daily. 30 tablet 3  . diazepam (VALIUM) 5 MG tablet Take 1 tablet (5 mg total) by mouth every 6 (six) hours as needed for muscle spasms. 30 tablet 0  . oxybutynin (DITROPAN-XL) 5 MG 24 hr tablet Take 1 tablet (5 mg total) by mouth at bedtime. 30 tablet 3   No current facility-administered medications on file prior to visit.    No Known Allergies  REVIEW OF SYSTEMS: All other systems reviewed and negative except where noted in the History of Present Illness.  PHYSICAL EXAM: BP 132/70   Pulse 64   Temp 98.2 F (36.8 C)   Ht 5' (1.524 m)   Wt 158 lb 9.6 oz (71.9 kg)   BMI 30.97 kg/m  General: Well developed  69 year old female in no acute distress. Head: Normocephalic and atraumatic. Eyes:  Sclerae non-icteric, conjunctive pink. Ears: Normal auditory acuity. Mouth: Upper dentures. Poor dentition. No ulcers or lesions.  Neck: Supple. Right thyroid nodule. No lymphadenopathy.  Lungs: Clear bilaterally to auscultation without wheezes, crackles or rhonchi. Heart: Regular rate and rhythm. No murmur, rub or gallop appreciated.  Abdomen: Soft, nontender, non distended. No masses. No hepatosplenomegaly. Normoactive bowel sounds x 4 quadrants.  Rectal: Deferred.  Musculoskeletal: Symmetrical with no gross  deformities. Skin: Warm and dry. No rash or lesions on visible extremities. Extremities: No edema. Neurological: Alert oriented x 4, no focal deficits.  Psychological:  Alert and cooperative. Normal mood and affect.  ASSESSMENT AND PLAN:  51. 69 year old female with dysphagia, rule out esophageal etiology vs cervical disc disease -EGD benefits and risks discussed including risk with sedation, risk of bleeding, perforation and infection  -If EGD negative to consider swallow study  2. Infrequent rectal bleeding.  -Colonoscopy benefits and risks discussed including risk with sedation, risk of bleeding, perforation and infection   3. HTN, stable  Further follow up to be determined after EGD and colonoscopy completed.              CC:  Martinique, Betty G, MD

## 2020-01-09 NOTE — Progress Notes (Signed)
Agree with assessment and plan as outlined.  

## 2020-01-11 ENCOUNTER — Other Ambulatory Visit: Payer: Self-pay

## 2020-01-11 ENCOUNTER — Telehealth: Payer: Self-pay | Admitting: Nurse Practitioner

## 2020-01-11 ENCOUNTER — Encounter: Payer: Self-pay | Admitting: General Surgery

## 2020-01-11 ENCOUNTER — Other Ambulatory Visit (HOSPITAL_COMMUNITY)
Admission: RE | Admit: 2020-01-11 | Discharge: 2020-01-11 | Disposition: A | Discharge status: Home / Self Care | Payer: Medicare Other | Source: Ambulatory Visit | Attending: Gastroenterology | Admitting: Gastroenterology

## 2020-01-11 DIAGNOSIS — Z20822 Contact with and (suspected) exposure to covid-19: Secondary | ICD-10-CM | POA: Diagnosis not present

## 2020-01-11 DIAGNOSIS — Z01812 Encounter for preprocedural laboratory examination: Secondary | ICD-10-CM | POA: Diagnosis not present

## 2020-01-11 LAB — SARS CORONAVIRUS 2 (TAT 6-24 HRS): SARS Coronavirus 2: NEGATIVE

## 2020-01-11 MED ORDER — NA SULFATE-K SULFATE-MG SULF 17.5-3.13-1.6 GM/177ML PO SOLN: 1.0000 | Freq: Once | ORAL | 0 refills | Status: AC

## 2020-01-11 NOTE — Telephone Encounter (Signed)
Suprep sent to pharmacy 

## 2020-01-11 NOTE — Telephone Encounter (Signed)
Spoke to patient she needs her prep called to the pharmacy. Prep sent to patients pharmacy on file.

## 2020-01-12 ENCOUNTER — Telehealth: Payer: Self-pay | Admitting: Gastroenterology

## 2020-01-12 ENCOUNTER — Telehealth: Payer: Self-pay | Admitting: *Deleted

## 2020-01-12 DIAGNOSIS — K625 Hemorrhage of anus and rectum: Secondary | ICD-10-CM

## 2020-01-12 MED ORDER — SUPREP BOWEL PREP KIT 17.5-3.13-1.6 GM/177ML PO SOLN: 1.0000 | Freq: Once | ORAL | 0 refills | Status: AC

## 2020-01-12 NOTE — Telephone Encounter (Signed)
Rx sent to [facility].  Pt states she had prep instructions in front of her at home- I reviewed the times with her and understanding voiced

## 2020-01-12 NOTE — Telephone Encounter (Signed)
Pt is scheduled for an EGD/COL 01/13/20 and stated that rx prep has not been sent to pharmacy.  She would also like to discuss instructions.

## 2020-01-12 NOTE — Telephone Encounter (Signed)
Pt states her Suprep is $114 dollars  She says [facility] has Suprep generic at [employer] - I explained I didn't think Suprep has a generic but she said they told her - [facility] states they have [facility] - called pt and explained there trulu is no generic Suprep- I offered pt a sample of SUprep - She began to cry and thanked me for being so generous- she was very worried about the cost and said she couldn't pat $114  - pt will pick up suprep sample 3rd floor today   Darlene Griffith PV

## 2020-01-13 ENCOUNTER — Ambulatory Visit (AMBULATORY_SURGERY_CENTER): Payer: Medicare Other | Admitting: Gastroenterology

## 2020-01-13 ENCOUNTER — Encounter: Payer: Self-pay | Admitting: Gastroenterology

## 2020-01-13 ENCOUNTER — Other Ambulatory Visit: Payer: Self-pay

## 2020-01-13 VITALS — BP 135/82 | HR 74 | Temp 97.1°F | Resp 15 | Ht 60.0 in | Wt 158.0 lb

## 2020-01-13 DIAGNOSIS — D123 Benign neoplasm of transverse colon: Secondary | ICD-10-CM | POA: Diagnosis not present

## 2020-01-13 DIAGNOSIS — D124 Benign neoplasm of descending colon: Secondary | ICD-10-CM | POA: Diagnosis not present

## 2020-01-13 DIAGNOSIS — D122 Benign neoplasm of ascending colon: Secondary | ICD-10-CM

## 2020-01-13 DIAGNOSIS — B9681 Helicobacter pylori [H. pylori] as the cause of diseases classified elsewhere: Secondary | ICD-10-CM

## 2020-01-13 DIAGNOSIS — R1310 Dysphagia, unspecified: Secondary | ICD-10-CM | POA: Diagnosis not present

## 2020-01-13 DIAGNOSIS — K2950 Unspecified chronic gastritis without bleeding: Secondary | ICD-10-CM | POA: Diagnosis not present

## 2020-01-13 DIAGNOSIS — K449 Diaphragmatic hernia without obstruction or gangrene: Secondary | ICD-10-CM | POA: Diagnosis not present

## 2020-01-13 DIAGNOSIS — Z1211 Encounter for screening for malignant neoplasm of colon: Secondary | ICD-10-CM

## 2020-01-13 DIAGNOSIS — D125 Benign neoplasm of sigmoid colon: Secondary | ICD-10-CM | POA: Diagnosis not present

## 2020-01-13 DIAGNOSIS — I10 Essential (primary) hypertension: Secondary | ICD-10-CM | POA: Diagnosis not present

## 2020-01-13 HISTORY — PX: COLONOSCOPY: SHX174

## 2020-01-13 HISTORY — PX: UPPER GASTROINTESTINAL ENDOSCOPY: SHX188

## 2020-01-13 MED ORDER — SODIUM CHLORIDE 0.9 % IV SOLN: 500.0000 mL | Freq: Once | INTRAVENOUS | Status: DC

## 2020-01-13 NOTE — Progress Notes (Signed)
Called to room to assist during endoscopic procedure.  Patient ID and intended procedure confirmed with present staff. Received instructions for my participation in the procedure from the performing physician.  

## 2020-01-13 NOTE — Progress Notes (Signed)
Temp - LC Vitals-CW  Pt's states no medical or surgical changes since previsit or office visit.

## 2020-01-13 NOTE — Patient Instructions (Signed)
Handouts provided on polyps, diverticulosis, hemorrhoids, gastritis, esophagitis and stricture, and post dilation diet.  YOU HAD AN ENDOSCOPIC PROCEDURE TODAY AT [facility]:   Refer to the procedure report that was given to you for any specific questions about what was found during the examination.  If the procedure report does not answer your questions, please call your gastroenterologist to clarify.  If you requested that your care partner not be given the details of your procedure findings, then the procedure report has been included in a sealed envelope for you to review at your convenience later.  YOU SHOULD EXPECT: Some feelings of bloating in the abdomen. Passage of more gas than usual.  Walking can help get rid of the air that was put into your GI tract during the procedure and reduce the bloating. If you had a lower endoscopy (such as a colonoscopy or flexible sigmoidoscopy) you may notice spotting of blood in your stool or on the toilet paper. If you underwent a bowel prep for your procedure, you may not have a normal bowel movement for a few days.  Please Note:  You might notice some irritation and congestion in your nose or some drainage.  This is from the oxygen used during your procedure.  There is no need for concern and it should clear up in a day or so.  SYMPTOMS TO REPORT IMMEDIATELY:   Following lower endoscopy (colonoscopy or flexible sigmoidoscopy):  Excessive amounts of blood in the stool  Significant tenderness or worsening of abdominal pains  Swelling of the abdomen that is new, acute  Fever of 100F or higher   Following upper endoscopy (EGD)  Vomiting of blood or coffee ground material  New chest pain or pain under the shoulder blades  Painful or persistently difficult swallowing  New shortness of breath  Fever of 100F or higher  Black, tarry-looking stools  For urgent or emergent issues, a gastroenterologist can be reached at any hour by calling  [phone]. Do not use MyChart messaging for urgent concerns.    DIET:  Clear liquids for 2 hours (until 1045), then a Soft diet for the rest of today (see handout).  Drink plenty of fluids but you should avoid alcoholic beverages for 24 hours.  ACTIVITY:  You should plan to take it easy for the rest of today and you should NOT DRIVE or use heavy machinery until tomorrow (because of the sedation medicines used during the test).    FOLLOW UP: Our staff will call the number listed on your records 48-72 hours following your procedure to check on you and address any questions or concerns that you may have regarding the information given to you following your procedure. If we do not reach you, we will leave a message.  We will attempt to reach you two times.  During this call, we will ask if you have developed any symptoms of COVID 19. If you develop any symptoms (ie: fever, flu-like symptoms, shortness of breath, cough etc.) before then, please call [phone].  If you test positive for Covid 19 in the 2 weeks post procedure, please call and report this information to [place].    If any biopsies were taken you will be contacted by phone or by letter within the next 1-3 weeks.  Please call us at [phone] if you have not heard about the biopsies in 3 weeks.    SIGNATURES/CONFIDENTIALITY: You and/or your care partner have signed paperwork which will be entered into your  electronic medical record.  These signatures attest to the fact that that the information above on your After Visit Summary has been reviewed and is understood.  Full responsibility of the confidentiality of this discharge information lies with you and/or your care-partner.

## 2020-01-13 NOTE — Op Note (Addendum)
[facility] Patient Name: Erisa Eunice Procedure Date: 01/13/2020 7:49 AM MRN: [id number] Endoscopist: Remo Lipps P. Havery Moros , MD Age: 69 Referring MD:  Date of Birth: August 03, 1951 Gender: Female Account #: [id number] Procedure:                Colonoscopy Indications:              Screening for colorectal malignant neoplasm, This                            is the patient's first colonoscopy Medicines:                Monitored Anesthesia Care Procedure:                Pre-Anesthesia Assessment:                           - Prior to the procedure, a History and Physical                            was performed, and patient medications and                            allergies were reviewed. The patient's tolerance of                            previous anesthesia was also reviewed. The risks                            and benefits of the procedure and the sedation                            options and risks were discussed with the patient.                            All questions were answered, and informed consent                            was obtained. Prior Anticoagulants: The patient has                            taken no previous anticoagulant or antiplatelet                            agents. ASA Grade Assessment: II - A patient with                            mild systemic disease. After reviewing the risks                            and benefits, the patient was deemed in                            satisfactory condition to undergo the procedure.  After obtaining informed consent, the colonoscope                            was passed under direct vision. Throughout the                            procedure, the patient's blood pressure, pulse, and                            oxygen saturations were monitored continuously. The                            Colonoscope was introduced through the anus and                            advanced to the the  cecum, identified by                            appendiceal orifice and ileocecal valve. The                            colonoscopy was performed without difficulty. The                            patient tolerated the procedure well. The quality                            of the bowel preparation was good. The ileocecal                            valve, appendiceal orifice, and rectum were                            photographed. Scope In: 8:04:31 AM Scope Out: 8:40:29 AM Scope Withdrawal Time: 0 hours 23 minutes 11 seconds  Total Procedure Duration: 0 hours 35 minutes 58 seconds  Findings:                 The perianal and digital rectal examinations were                            normal.                           A 3 mm polyp was found in the ascending colon. The                            polyp was sessile. The polyp was removed with a                            cold snare. Resection and retrieval were complete.                           A 6 mm polyp was found in the hepatic flexure. The  polyp was pedunculated. The polyp was removed with                            a hot snare. Resection and retrieval were complete.                           A 3 mm polyp was found in the hepatic flexure. The                            polyp was sessile. The polyp was removed with a                            cold snare. Resection and retrieval were complete.                           14 sessile polyps were found in the transverse                            colon. The polyps were 3 to 7 mm in size. These                            polyps were removed with a cold snare. Resection                            and retrieval were complete.                           Two sessile polyps were found in the descending                            colon. The polyps were 3 to 5 mm in size. These                            polyps were removed with a cold snare. Resection                             and retrieval were complete.                           Three sessile polyps were found in the sigmoid                            colon. The polyps were 3 to 4 mm in size. These                            polyps were removed with a cold snare. Resection                            and retrieval were complete.                           A 8 mm polyp was found in the sigmoid  colon. The                            polyp was pedunculated. The polyp was removed with                            a hot snare. Resection and retrieval were complete.                           Multiple small-mouthed diverticula were found in                            the sigmoid colon and ascending colon.                           Internal hemorrhoids were found.                           There were numerous hyperplastic polyps in the left                            colon which were not removed. The exam was                            otherwise without abnormality. Complications:            No immediate complications. Estimated blood loss:                            Minimal. Estimated Blood Loss:     Estimated blood loss was minimal. Impression:               - One 3 mm polyp in the ascending colon, removed                            with a cold snare. Resected and retrieved.                           - One 6 mm polyp at the hepatic flexure, removed                            with a hot snare. Resected and retrieved.                           - One 3 mm polyp at the hepatic flexure, removed                            with a cold snare. Resected and retrieved.                           - Fourteen 3 to 7 mm polyps in the transverse                            colon, removed with a cold snare. Resected and  retrieved.                           - Two 3 to 5 mm polyps in the descending colon,                            removed with a cold snare. Resected and retrieved.                           -  Three 3 to 4 mm polyps in the sigmoid colon,                            removed with a cold snare. Resected and retrieved.                           - One 8 mm polyp in the sigmoid colon, removed with                            a hot snare. Resected and retrieved.                           - Diverticulosis in the sigmoid colon and in the                            ascending colon.                           - Internal hemorrhoids.                           - Hyperplastic polyps of the left colon.                           - The examination was otherwise normal. Recommendation:           - Patient has a contact number available for                            emergencies. The signs and symptoms of potential                            delayed complications were discussed with the                            patient. Return to normal activities tomorrow.                            Written discharge instructions were provided to the                            patient.                           - Resume previous diet.                           -  Continue present medications.                           - Await pathology results. Remo Lipps P. Griff Badley, MD 01/13/2020 8:53:53 AM This report has been signed electronically.

## 2020-01-13 NOTE — Op Note (Addendum)
[facility] Patient Name: Darlene Griffith Procedure Date: 01/13/2020 7:50 AM MRN: [id number] Endoscopist: Remo Lipps P. Havery Moros , MD Age: 69 Referring MD:  Date of Birth: 1951-02-17 Gender: Female Account #: [id number] Procedure:                Upper GI endoscopy Indications:              Dysphagia Medicines:                Monitored Anesthesia Care Procedure:                Pre-Anesthesia Assessment:                           - Prior to the procedure, a History and Physical                            was performed, and patient medications and                            allergies were reviewed. The patient's tolerance of                            previous anesthesia was also reviewed. The risks                            and benefits of the procedure and the sedation                            options and risks were discussed with the patient.                            All questions were answered, and informed consent                            was obtained. Prior Anticoagulants: The patient has                            taken no previous anticoagulant or antiplatelet                            agents. ASA Grade Assessment: II - A patient with                            mild systemic disease. After reviewing the risks                            and benefits, the patient was deemed in                            satisfactory condition to undergo the procedure.                           After obtaining informed consent, the endoscope was  passed under direct vision. Throughout the                            procedure, the patient's blood pressure, pulse, and                            oxygen saturations were monitored continuously. The                            Endoscope was introduced through the mouth, and                            advanced to the second part of duodenum. The upper                            GI endoscopy was accomplished without  difficulty.                            The patient tolerated the procedure well. Scope In: Scope Out: Findings:                 Esophagogastric landmarks were identified: the                            Z-line was found at 38 cm, the gastroesophageal                            junction was found at 38 cm and the upper extent of                            the gastric folds was found at 40 cm from the                            incisors.                           A 2 cm hiatal hernia was present.                           The exam of the esophagus was otherwise normal. No                            focal stenosis / stricture noted. Gastric inlet                            patch noted in the proximal esophagus.                           A guidewire was placed and the scope was withdrawn.                            Empiric dilation was performed in the entire  esophagus with a Savary dilator with mild                            resistance at 17 mm and 18 mm. Relook endoscopy                            showed no mucosal wrents.                           Patchy mild inflammation characterized by erythema                            and friability was found in the gastric antrum.                           The exam of the stomach was otherwise normal.                           Biopsies were taken with a cold forceps in the                            gastric body, at the incisura and in the gastric                            antrum for Helicobacter pylori testing.                           The duodenal bulb and second portion of the                            duodenum were normal. Complications:            No immediate complications. Estimated blood loss:                            Minimal. Estimated Blood Loss:     Estimated blood loss was minimal. Impression:               - Esophagogastric landmarks identified.                           - 2 cm hiatal hernia.                            - Normal esophagus - empiric dilation performed to                            23mm                           - Gastritis. Biopsies taken for H pylori                           - Normal stomach otherwise.                           -  Normal duodenal bulb and second portion of the                            duodenum. Recommendation:           - Patient has a contact number available for                            emergencies. The signs and symptoms of potential                            delayed complications were discussed with the                            patient. Return to normal activities tomorrow.                            Written discharge instructions were provided to the                            patient.                           - Resume previous diet.                           - Continue present medications.                           - Await pathology results and course post dilation [name], MD 01/13/2020 8:58:12 AM This report has been signed electronically.

## 2020-01-14 ENCOUNTER — Encounter (HOSPITAL_COMMUNITY): Payer: Self-pay | Admitting: Emergency Medicine

## 2020-01-14 ENCOUNTER — Emergency Department (HOSPITAL_COMMUNITY)
Admission: EM | Admit: 2020-01-14 | Discharge: 2020-01-14 | Disposition: A | Discharge status: Home / Self Care | Payer: Medicare Other | Attending: Emergency Medicine | Admitting: Emergency Medicine

## 2020-01-14 ENCOUNTER — Emergency Department (HOSPITAL_COMMUNITY): Payer: Medicare Other

## 2020-01-14 DIAGNOSIS — R519 Headache, unspecified: Secondary | ICD-10-CM | POA: Insufficient documentation

## 2020-01-14 DIAGNOSIS — M542 Cervicalgia: Secondary | ICD-10-CM | POA: Insufficient documentation

## 2020-01-14 DIAGNOSIS — I10 Essential (primary) hypertension: Secondary | ICD-10-CM | POA: Insufficient documentation

## 2020-01-14 DIAGNOSIS — Z981 Arthrodesis status: Secondary | ICD-10-CM | POA: Diagnosis not present

## 2020-01-14 DIAGNOSIS — I671 Cerebral aneurysm, nonruptured: Secondary | ICD-10-CM | POA: Insufficient documentation

## 2020-01-14 DIAGNOSIS — Z79899 Other long term (current) drug therapy: Secondary | ICD-10-CM | POA: Insufficient documentation

## 2020-01-14 DIAGNOSIS — F17200 Nicotine dependence, unspecified, uncomplicated: Secondary | ICD-10-CM | POA: Diagnosis not present

## 2020-01-14 LAB — BASIC METABOLIC PANEL
Anion gap: 8 (ref 5–15)
BUN: 5 mg/dL — ABNORMAL LOW (ref 8–23)
CO2: 24 mmol/L (ref 22–32)
Calcium: 8.7 mg/dL — ABNORMAL LOW (ref 8.9–10.3)
Chloride: 110 mmol/L (ref 98–111)
Creatinine, Ser: 0.44 mg/dL (ref 0.44–1.00)
GFR calc Af Amer: >60 mL/min (ref >60)
GFR calc non Af Amer: >60 mL/min (ref >60)
Glucose, Bld: 95 mg/dL (ref 70–99)
Potassium: 3.8 mmol/L (ref 3.5–5.1)
Sodium: 142 mmol/L (ref 135–145)

## 2020-01-14 LAB — CBC
HCT: 40.8 % (ref 36.0–46.0)
Hemoglobin: 12.8 g/dL (ref 12.0–15.0)
MCH: 34 pg (ref 26.0–34.0)
MCHC: 31.4 g/dL (ref 30.0–36.0)
MCV: 108.5 fL — ABNORMAL HIGH (ref 80.0–100.0)
Platelets: 283 10*3/uL (ref 150–400)
RBC: 3.76 MIL/uL — ABNORMAL LOW (ref 3.87–5.11)
RDW: 11.6 % (ref 11.5–15.5)
WBC: 9.5 10*3/uL (ref 4.0–10.5)
nRBC: 0 % (ref 0.0–0.2)

## 2020-01-14 LAB — C-REACTIVE PROTEIN: CRP: 1 mg/dL — ABNORMAL HIGH (ref <1.0)

## 2020-01-14 LAB — I-STAT CREATININE, ED: Creatinine, Ser: 0.4 mg/dL — ABNORMAL LOW (ref 0.44–1.00)

## 2020-01-14 LAB — SEDIMENTATION RATE: Sed Rate: 20 mm/hr (ref 0–22)

## 2020-01-14 MED ORDER — PROCHLORPERAZINE EDISYLATE 10 MG/2ML IJ SOLN
5.0000 mg | Freq: Once | INTRAMUSCULAR | Status: AC
  Administered 2020-01-14: 5 mg via INTRAMUSCULAR
  Filled 2020-01-14: qty 2

## 2020-01-14 MED ORDER — IOHEXOL 350 MG/ML SOLN
50.0000 mL | Freq: Once | INTRAVENOUS | Status: AC | PRN
  Administered 2020-01-14: 12:00:00 50 mL via INTRAVENOUS

## 2020-01-14 MED ORDER — HYDROCODONE-ACETAMINOPHEN 5-325 MG PO TABS: 1.0000 | ORAL_TABLET | Freq: Four times a day (QID) | ORAL | 0 refills | Status: AC | PRN

## 2020-01-14 MED ORDER — LORAZEPAM 2 MG/ML IJ SOLN
1.0000 mg | Freq: Once | INTRAMUSCULAR | Status: AC | PRN
  Administered 2020-01-14: 1 mg via INTRAVENOUS
  Filled 2020-01-14: qty 1

## 2020-01-14 MED ORDER — MORPHINE SULFATE (PF) 4 MG/ML IV SOLN
4.0000 mg | Freq: Once | INTRAVENOUS | Status: AC
  Administered 2020-01-14: 4 mg via INTRAVENOUS
  Filled 2020-01-14: qty 1

## 2020-01-14 MED ORDER — GADOBUTROL 1 MMOL/ML IV SOLN
7.0000 mL | Freq: Once | INTRAVENOUS | Status: AC | PRN
  Administered 2020-01-14: 7 mL via INTRAVENOUS

## 2020-01-14 MED ORDER — OXYCODONE-ACETAMINOPHEN 5-325 MG PO TABS
1.0000 | ORAL_TABLET | Freq: Once | ORAL | Status: AC
  Administered 2020-01-14: 1 via ORAL
  Filled 2020-01-14: qty 1

## 2020-01-14 MED ORDER — DIPHENHYDRAMINE HCL 50 MG/ML IJ SOLN
25.0000 mg | Freq: Once | INTRAMUSCULAR | Status: AC
  Administered 2020-01-14: 10:00:00 25 mg via INTRAVENOUS
  Filled 2020-01-14: qty 1

## 2020-01-14 MED ORDER — DEXAMETHASONE SODIUM PHOSPHATE 10 MG/ML IJ SOLN
10.0000 mg | Freq: Once | INTRAMUSCULAR | Status: AC
  Administered 2020-01-14: 10:00:00 10 mg via INTRAMUSCULAR
  Filled 2020-01-14: qty 1

## 2020-01-14 MED ORDER — METHYLPREDNISOLONE 4 MG PO TBPK: ORAL_TABLET | ORAL | 0 refills | Status: DC

## 2020-01-14 MED ORDER — HYDROCODONE-ACETAMINOPHEN 5-325 MG PO TABS
1.0000 | ORAL_TABLET | Freq: Once | ORAL | Status: AC
  Administered 2020-01-14: 18:00:00 1 via ORAL
  Filled 2020-01-14: qty 1

## 2020-01-14 NOTE — Consult Note (Signed)
Reason for Consult: Right-sided neck pain  Referring Physician: Margarita Mail, PA  Darlene Griffith is an 69 y.o. female.  HPI: Patient with a history of migraines, hypertension, and a C6-7 ACDF by Dr. Ellene Route on 07/05/2019. She presented to the emergency department early this morning with a complaint of right-sided neck pain that radiates up the right side of her face and into her right trapezius muscle. She has a burning sensation in her neck as well. Her neck has been bothering her for about one week. She reports that she underwent an upper endoscopy and colonoscopy yesterday and was at her baseline pain level following the procedure. She woke up with her current severe symptoms this morning. A CTA neck and MRI brain were performed today. 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 Neurosurgery was consulted for further evaluation and recommendations.  Past Medical History:  Diagnosis Date  . Chest pain    a. 01/2015 Echo: Nl LV fxn, Gr 1 DD, triv AI, mild MR.  . Essential hypertension   . Hepatic cyst    a. noted on CT 01/2015.  [name] Hyperthyroidism    GOING TO [facility] FOR SECOND OPINION  . Multinodular goiter    a. 01/2015 CT chest: multinodular goidter w/ substernal extension of the left lobe of the thyroid assoc w/ rightward deviation of tracheal air column.  . Neck pain, chronic   . Pulmonary nodules    a. 01/2015 CT Chest: RLL ~ 42mm subpleural nodule - rec f/u in 6-12 mos.  [name] Splenic cyst    a. noted on CT 01/2015.    Past Surgical History:  Procedure Laterality Date  . CERVICAL DISC ARTHROPLASTY N/A 07/05/2019   Procedure: Cervical Six-Seven Artificial disc replacement;  Surgeon: Kristeen Miss, MD;  Location: [facility];  Service: Neurosurgery;  Laterality: N/A;  Cervical Six-Seven Artificial disc replacement  . CESAREAN SECTION    . COLONOSCOPY  01/13/2020  . TONSILLECTOMY     AROUND 5-6 YRS OLD  . UPPER GASTROINTESTINAL ENDOSCOPY  01/13/2020     Family History  Problem Relation Age of Onset  . Heart attack Maternal Grandmother        deceased  . High blood pressure Mother   . High Cholesterol Mother   . Thyroid disease Mother   . Thyroid disease Sister   . Colon cancer Neg Hx   . Colon polyps Neg Hx   . Esophageal cancer Neg Hx   . Rectal cancer Neg Hx   . Stomach cancer Neg Hx     Social History:  reports that she has been smoking. She has never used smokeless tobacco. She reports current alcohol use. She reports that she does not use drugs.  Allergies: No Known Allergies  Medications: I have reviewed the patient's current medications.  Results for orders placed or performed during the hospital encounter of 01/14/20 (from the past 48 hour(s))  CBC     Status: Abnormal   Collection Time: 01/14/20  8:16 AM  Result Value Ref Range   WBC 9.5 4.0 - 10.5 K/uL   RBC 3.76 (L) 3.87 - 5.11 MIL/uL   Hemoglobin 12.8 12.0 - 15.0 g/dL   HCT 40.8 36.0 - 46.0 %   MCV 108.5 (H) 80.0 - 100.0 fL   MCH 34.0 26.0 - 34.0 pg   MCHC 31.4 30.0 - 36.0 g/dL   RDW 11.6 11.5 - 15.5 %   Platelets 283 150 - 400 K/uL  nRBC 0.0 0.0 - 0.2 %    Comment: Performed at [facility], [place] [address], [place], [place] [address]  Sedimentation rate     Status: None   Collection Time: 01/14/20  8:16 AM  Result Value Ref Range   Sed Rate 20 0 - 22 mm/hr    Comment: Performed at [facility] [address], [place], [place] [address]  C-reactive protein     Status: Abnormal   Collection Time: 01/14/20  8:16 AM  Result Value Ref Range   CRP 1.0 (H) <1.0 mg/dL    Comment: Performed at [facility] [address] [place], [place] [id number]  Basic metabolic panel     Status: Abnormal   Collection Time: 01/14/20 11:24 AM  Result Value Ref Range   Sodium 142 135 - 145 mmol/L   Potassium 3.8 3.5 - 5.1 mmol/L   Chloride 110 98 - 111 mmol/L   CO2 24 22 - 32 mmol/L   Glucose, Bld 95 70 - 99 mg/dL    Comment: Glucose reference  range applies only to samples taken after fasting for at least 8 hours.   BUN 5 (L) 8 - 23 mg/dL   Creatinine, Ser 0.44 0.44 - 1.00 mg/dL   Calcium 8.7 (L) 8.9 - 10.3 mg/dL   GFR calc non Af Amer >60 >60 mL/min   GFR calc Af Amer >60 >60 mL/min   Anion gap 8 5 - 15    Comment: Performed at [facility] [address] [place], [place] [address]  I-stat Creatinine, ED     Status: Abnormal   Collection Time: 01/14/20 11:31 AM  Result Value Ref Range   Creatinine, Ser 0.40 (L) 0.44 - 1.00 mg/dL    CT Angio Head W or Wo Contrast  Result Date: 01/14/2020 CLINICAL DATA:  Headache normal neuro exam EXAM: CT ANGIOGRAPHY HEAD AND NECK TECHNIQUE: Multidetector CT imaging of the head and neck was performed using the standard protocol during bolus administration of intravenous contrast. Multiplanar CT image reconstructions and MIPs were obtained to evaluate the vascular anatomy. Carotid stenosis measurements (when applicable) are obtained utilizing NASCET criteria, using the distal internal carotid diameter as the denominator. CONTRAST:  77mL OMNIPAQUE IOHEXOL 350 MG/ML SOLN COMPARISON:  CT head 08/16/2018.  MRI 08/16/2018 FINDINGS: CT HEAD FINDINGS Brain: New area of hypodensity in the right subinsular white matter extending into the anterior limb internal capsule as well as the external capsule and right frontal lobe. This is consistent with infarct of indeterminate age. Chronic microvascular ischemic changes in the white matter bilaterally were noted previously. Negative for hemorrhage or mass.  Ventricle size normal. Vascular: Negative for hyperdense vessel Skull: Negative Sinuses: Negative Orbits: Negative Review of the MIP images confirms the above findings CTA NECK FINDINGS Aortic arch: Standard branching. Imaged portion shows no evidence of aneurysm or dissection. No significant stenosis of the major arch vessel origins. Right carotid system: Right carotid artery widely patent without stenosis or  irregularity. Tortuosity right internal carotid artery. Left carotid system: Left carotid widely patent without stenosis or irregularity. Tortuosity left internal carotid artery. Vertebral arteries: Both vertebral arteries widely patent to the basilar. 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 No acute skeletal abnormality. Normal cervical alignment. Other neck: Thyroid goiter. Both lobes of the thyroid are enlarged with multiple low-density areas in the thyroid. Substernal goiter extending to the left of the trachea displacing the trachea to the right. The goiter extends down to the level  the pulmonary artery. This is similar to the prior CT. Thyroid ultrasound recommended (ref: J Am Coll Radiol. 2015 Feb;12(2): 143-50). Upper chest: Mediastinal mass compatible with substernal goiter. Lung apices clear bilaterally. Review of the MIP images confirms the above findings CTA HEAD FINDINGS Anterior circulation: Cavernous carotid widely patent bilaterally without stenosis. Anterior and middle cerebral arteries widely patent bilaterally 3 x 5 mm aneurysm of the left anterior cerebral artery in the A2 segment approximately 1 cm distal to the anterior communicating artery. Posterior circulation: Both vertebral arteries patent to the basilar. PICA patent bilaterally. Basilar widely patent. Superior cerebellar and posterior cerebral arteries patent without stenosis or aneurysm. Venous sinuses: Normal venous enhancement Anatomic variants: None Review of the MIP images confirms the above findings IMPRESSION: 1. New area of low-density in the subinsular white matter on the right as well as the right internal capsule anteriorly and right frontal lobe. This is consistent with infarct of indeterminate age but was not present on the prior study from 2019. Recommend MRI for further evaluation. No intracranial hemorrhage 2. No significant carotid or vertebral artery stenosis in the neck 3. Negative for intracranial stenosis or large vessel  occlusion 4. 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 No evidence of acute subarachnoid hemorrhage on CT. 5. Prominent goiter with extensive substernal goiter extending into the mediastinum. 6. These results were called by telephone at the time of interpretation on 01/14/2020 at 12:37 pm to provider ABIGAIL HARRIS , who verbally acknowledged these results. Electronically Signed   By: Franchot Gallo M.D.   On: 01/14/2020 12:39   CT Angio Neck W and/or Wo Contrast  Result Date: 01/14/2020 CLINICAL DATA:  Headache normal neuro exam EXAM: CT ANGIOGRAPHY HEAD AND NECK TECHNIQUE: Multidetector CT imaging of the head and neck was performed using the standard protocol during bolus administration of intravenous contrast. Multiplanar CT image reconstructions and MIPs were obtained to evaluate the vascular anatomy. Carotid stenosis measurements (when applicable) are obtained utilizing NASCET criteria, using the distal internal carotid diameter as the denominator. CONTRAST:  22mL OMNIPAQUE IOHEXOL 350 MG/ML SOLN COMPARISON:  CT head 08/16/2018.  MRI 08/16/2018 FINDINGS: CT HEAD FINDINGS Brain: New area of hypodensity in the right subinsular white matter extending into the anterior limb internal capsule as well as the external capsule and right frontal lobe. This is consistent with infarct of indeterminate age. Chronic microvascular ischemic changes in the white matter bilaterally were noted previously. Negative for hemorrhage or mass.  Ventricle size normal. Vascular: Negative for hyperdense vessel Skull: Negative Sinuses: Negative Orbits: Negative Review of the MIP images confirms the above findings CTA NECK FINDINGS Aortic arch: Standard branching. Imaged portion shows no evidence of aneurysm or dissection. No significant stenosis of the major arch vessel origins. Right carotid system: Right carotid artery widely patent without stenosis or irregularity. Tortuosity right internal carotid artery. Left  carotid system: Left carotid widely patent without stenosis or irregularity. Tortuosity left internal carotid artery. Vertebral arteries: Both vertebral arteries widely patent to the basilar. 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 No acute skeletal abnormality. Normal cervical alignment. Other neck: Thyroid goiter. Both lobes of the thyroid are enlarged with multiple low-density areas in the thyroid. Substernal goiter extending to the left of the trachea displacing the trachea to the right. The goiter extends down to the level the pulmonary artery. This is similar to the prior CT. Thyroid ultrasound recommended (ref: J Am Coll Radiol. 2015 Feb;12(2): 143-50). Upper chest: Mediastinal mass compatible with substernal goiter. Lung  apices clear bilaterally. Review of the MIP images confirms the above findings CTA HEAD FINDINGS Anterior circulation: Cavernous carotid widely patent bilaterally without stenosis. Anterior and middle cerebral arteries widely patent bilaterally 3 x 5 mm aneurysm of the left anterior cerebral artery in the A2 segment approximately 1 cm distal to the anterior communicating artery. Posterior circulation: Both vertebral arteries patent to the basilar. PICA patent bilaterally. Basilar widely patent. Superior cerebellar and posterior cerebral arteries patent without stenosis or aneurysm. Venous sinuses: Normal venous enhancement Anatomic variants: None Review of the MIP images confirms the above findings IMPRESSION: 1. New area of low-density in the subinsular white matter on the right as well as the right internal capsule anteriorly and right frontal lobe. This is consistent with infarct of indeterminate age but was not present on the prior study from 2019. Recommend MRI for further evaluation. No intracranial hemorrhage 2. No significant carotid or vertebral artery stenosis in the neck 3. Negative for intracranial stenosis or large vessel occlusion 4. 3 x 5 mm aneurysm left anterior cerebral artery A2  segment. No evidence of acute subarachnoid hemorrhage on CT. 5. Prominent goiter with extensive substernal goiter extending into the mediastinum. 6. These results were called by telephone at the time of interpretation on 01/14/2020 at 12:37 pm to provider ABIGAIL HARRIS , who verbally acknowledged these results. Electronically Signed   By: Franchot Gallo M.D.   On: 01/14/2020 12:39   MR BRAIN/IAC W WO CONTRAST  Result Date: 01/14/2020 CLINICAL DATA:  Severe pain shooting from the base of the neck into the right side of the face and head. Hearing loss. Cerebral aneurysm. EXAM: MRI HEAD WITHOUT AND WITH CONTRAST TECHNIQUE: Multiplanar, multiecho pulse sequences of the brain and surrounding structures were obtained without and with intravenous contrast. CONTRAST:  101mL GADAVIST GADOBUTROL 1 MMOL/ML IV SOLN COMPARISON:  Head CT/CTA 01/14/2020 and MRI 08/16/2018 FINDINGS: Multiple sequences are mildly to moderately motion degraded. Brain: There is no evidence of acute infarct, intracranial hemorrhage, mass, midline shift, or extra-axial fluid collection. Right basal ganglia lacunar infarcts are new from 2019 but chronic. Patchy T2 hyperintensities in the cerebral white matter bilaterally are unchanged from the prior MRI and nonspecific but compatible with moderately extensive chronic small vessel ischemic disease. Mild chronic small vessel changes are present in the pons. No abnormal enhancement is identified. The ventricles and sulci are within normal limits for age. Dedicated imaging through the internal auditory canals demonstrates a normal course of cranial nerves VII and VIII without evidence of mass or abnormal enhancement. Inner ear structures demonstrate normal signal bilaterally. No mass is seen within the cerebellopontine angles. Vascular: Major intracranial vascular flow voids are preserved. A2 aneurysm more fully evaluated on today's CTA. Skull and upper cervical spine: Unremarkable bone marrow signal.  Sinuses/Orbits: Unremarkable orbits. Paranasal sinuses and mastoid air cells are clear. Other: None. IMPRESSION: 1. No acute intracranial abnormality on this motion degraded study. 2. Negative internal auditory canal imaging. 3. Moderately extensive chronic small vessel ischemic disease with chronic infarcts in the right basal ganglia. Electronically Signed   By: Logan Bores M.D.   On: 01/14/2020 15:32    Review of Systems  Constitutional: Positive for activity change. Negative for appetite change, chills, diaphoresis, fatigue and fever.  HENT: Negative.   Eyes: Negative.   Respiratory: Negative for cough, chest tightness, shortness of breath and wheezing.   Cardiovascular: Negative for chest pain.  Gastrointestinal: Negative.   Endocrine: Negative.   Genitourinary: Negative for difficulty urinating, dysuria, flank  pain and frequency.  Musculoskeletal: Positive for neck pain. Negative for back pain, myalgias and neck stiffness.  Skin: Negative.   Neurological: Positive for numbness. Negative for syncope, facial asymmetry, speech difficulty and weakness.  Hematological: Negative.   Psychiatric/Behavioral: Negative.    Blood pressure (!) 150/93, pulse 85, temperature 98.6 F (37 C), temperature source Oral, resp. rate 20, SpO2 93 %. Physical Exam  Constitutional: She appears well-developed and well-nourished.  HENT:  Head: Normocephalic and atraumatic.  Eyes: Pupils are equal, round, and reactive to light. Conjunctivae and EOM are normal.  Cardiovascular: Normal rate.  Musculoskeletal:     Cervical back: Normal range of motion and neck supple.    Assessment/Plan: Patient with incidental discovery of left anterior cerebral artery aneurysm. Her symptoms appear to be related to her cervical spine. I would recommend following up with Dr. Ellene Route regarding her cervical spine and Dr. Kathyrn Sheriff for further evaluation of her aneurysm. The ED provider is sending in a [place] and  Neurosurgery will send in pain medication to the pharmacy on file.  Patricia Nettle 01/14/2020, 4:30 PM

## 2020-01-14 NOTE — ED Provider Notes (Signed)
[place] [facility] EMERGENCY DEPARTMENT Provider Note   CSN: [id number] Arrival date & time: 01/14/20  0630     History Chief Complaint  Patient presents with  . neck pain    Darlene Griffith is a 69 y.o. female who presents emergency department chief complaint of right-sided neck and facial pain.  Patient states that she has a history of migraines and a previous history of neck surgery but has never had any kind of pain like this before.  Patient states that she had onset of symptoms about 1 week ago.  Symptoms have progressively worsened and are now severe.  She states that the pain is indescribable but thinks it feels like a nerve pain.  It is sharp, constant, waxing and waning.  Nothing seems to make it worse or better however she does have associated pain in her jaw.  She states that it feels like when she broke a tooth and had an exposed nerve.  She has a history of chickenpox as a child.  She denies changes in vision, nausea or vomiting.  She has been unable to sleep for the past 2 days.  She took home medications.  She is concerned there is something wrong with her neck hardware from previous discectomy.  Denies upper extremity weakness or numbness.  HPI     Past Medical History:  Diagnosis Date  . Chest pain    a. 01/2015 Echo: Nl LV fxn, Gr 1 DD, triv AI, mild MR.  . Essential hypertension   . Hepatic cyst    a. noted on CT 01/2015.  [name] Hyperthyroidism    GOING TO [facility] FOR SECOND OPINION  . Multinodular goiter    a. 01/2015 CT chest: multinodular goidter w/ substernal extension of the left lobe of the thyroid assoc w/ rightward deviation of tracheal air column.  . Neck pain, chronic   . Pulmonary nodules    a. 01/2015 CT Chest: RLL ~ 15mm subpleural nodule - rec f/u in 6-12 mos.  [name] Splenic cyst    a. noted on CT 01/2015.    Patient Active Problem List   Diagnosis Date Noted  . Dysphagia 01/06/2020  . Cervical spondylosis with myelopathy and radiculopathy  07/05/2019  . Neck pain, chronic 05/21/2019  . Insomnia 07/27/2018  . Multinodular goiter 07/14/2018  . Chronic fatigue 06/15/2018  . Midsternal chest pain 02/18/2015  . Chest pain   . Cough   . Essential hypertension   . Shortness of breath   . Aortic stenosis   . Pain in the chest   . EKG abnormality   . Malaise and fatigue   . Lung nodule   . Splenic cyst   . Hepatic cyst   . Hyperlipidemia   . Hypertension 02/16/2015    Past Surgical History:  Procedure Laterality Date  . CERVICAL DISC ARTHROPLASTY N/A 07/05/2019   Procedure: Cervical Six-Seven Artificial disc replacement;  Surgeon: Kristeen Miss, MD;  Location: [facility];  Service: Neurosurgery;  Laterality: N/A;  Cervical Six-Seven Artificial disc replacement  . CESAREAN SECTION    . COLONOSCOPY  01/13/2020  . TONSILLECTOMY     AROUND 5-6 YRS OLD  . UPPER GASTROINTESTINAL ENDOSCOPY  01/13/2020     OB History   No obstetric history on file.     Family History  Problem Relation Age of Onset  . Heart attack Maternal Grandmother        deceased  . High blood pressure Mother   . High Cholesterol Mother   .  Thyroid disease Mother   . Thyroid disease Sister   . Colon cancer Neg Hx   . Colon polyps Neg Hx   . Esophageal cancer Neg Hx   . Rectal cancer Neg Hx   . Stomach cancer Neg Hx     Social History   Tobacco Use  . Smoking status: Current Some Day Smoker  . Smokeless tobacco: Never Used  . Tobacco comment: " long time ago"  Substance Use Topics  . Alcohol use: Yes    Alcohol/week: 0.0 standard drinks    Comment: rare  . Drug use: No    Home Medications Prior to Admission medications   Medication Sig Start Date End Date Taking? Authorizing Provider  amLODipine (NORVASC) 5 MG tablet Take 1 tablet (5 mg total) by mouth daily. Patient not taking: Reported on 01/13/2020 07/06/19 01/05/21  Kristeen Miss, MD    Allergies    Patient has no known allergies.  Review of Systems   Review of Systems Ten  systems reviewed and are negative for acute change, except as noted in the HPI.   Physical Exam Updated Vital Signs BP (!) 189/130 (BP Location: Right Arm)   Pulse 97   Temp 98.4 F (36.9 C) (Oral)   Resp 18   SpO2 100%   Physical Exam Vitals and nursing note reviewed.  Constitutional:      General: She is not in acute distress.    Appearance: She is well-developed. She is not diaphoretic.  HENT:     Head: Normocephalic and atraumatic.  Eyes:     General: No scleral icterus.    Conjunctiva/sclera: Conjunctivae normal.     Pupils: Pupils are equal, round, and reactive to light.     Comments: No horizontal, vertical or rotational nystagmus  Neck:     Comments: Full active and passive ROM without pain No midline or paraspinal tenderness No nuchal rigidity or meningeal signs Cardiovascular:     Rate and Rhythm: Normal rate and regular rhythm.     Heart sounds: Normal heart sounds. No murmur. No friction rub. No gallop.   Pulmonary:     Effort: Pulmonary effort is normal. No respiratory distress.     Breath sounds: Normal breath sounds. No wheezing or rales.  Abdominal:     General: Bowel sounds are normal. There is no distension.     Palpations: Abdomen is soft. There is no mass.     Tenderness: There is no abdominal tenderness. There is no guarding or rebound.  Musculoskeletal:        General: Normal range of motion.     Cervical back: Normal range of motion and neck supple.  Lymphadenopathy:     Cervical: No cervical adenopathy.  Skin:    General: Skin is warm and dry.     Findings: No rash.  Neurological:     Mental Status: She is alert and oriented to person, place, and time.     Cranial Nerves: No cranial nerve deficit.     Motor: No abnormal muscle tone.     Coordination: Coordination normal.     Comments: Mental Status:  Alert, oriented, thought content appropriate. Speech fluent without evidence of aphasia. Able to follow 2 step commands without difficulty.    Cranial Nerves:  II:  Peripheral visual fields grossly normal, pupils equal, round, reactive to light III,IV, VI: ptosis not present, extra-ocular motions intact bilaterally  V,VII: smile symmetric, facial light touch sensation equal VIII: hearing grossly normal bilaterally  IX,X: midline  uvula rise  XI: bilateral shoulder shrug equal and strong XII: midline tongue extension  Motor:  5/5 in upper and lower extremities bilaterally including strong and equal grip strength and dorsiflexion/plantar flexion Sensory: Pinprick and light touch normal in all extremities.  Cerebellar: normal finger-to-nose with bilateral upper extremities Gait: normal gait and balance CV: distal pulses palpable throughout   Psychiatric:        Behavior: Behavior normal.        Thought Content: Thought content normal.        Judgment: Judgment normal.     ED Results / Procedures / Treatments   Labs (all labs ordered are listed, but only abnormal results are displayed) Labs Reviewed  CBC  SEDIMENTATION RATE  C-REACTIVE PROTEIN    EKG None  Radiology No results found.  Procedures Procedures (including critical care time)  Medications Ordered in ED Medications  dexamethasone (DECADRON) injection 10 mg (has no administration in time range)  prochlorperazine (COMPAZINE) injection 5 mg (has no administration in time range)  diphenhydrAMINE (BENADRYL) injection 25 mg (has no administration in time range)  oxyCODONE-acetaminophen (PERCOCET/ROXICET) 5-325 MG per tablet 1 tablet (1 tablet Oral Given 01/14/20 [id number])    ED Course  I have reviewed the triage vital signs and the nursing notes.  Pertinent labs & imaging results that were available during my care of the patient were reviewed by me and considered in my medical decision making (see chart for details).  Clinical Course as of Jan 15 640  Sat Jan 14, 2020  1330 Findings with R frontal lobe infarct of unknown chronicity. 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 I discussed the case with Dr. [name] who called me with the findings. I also discussed the case with Dr. Rory Percy of Neurology who recommended an MRI IAC w/w out. I also discussed the case with PA Reinaldo Meeker of neurosurgery on the phone during operation. She and Dr. Arnoldo Morale will consult soon.   [AH]    Clinical Course User Index [AH] Ned Grace   MDM Rules/Calculators/A&P                      Patient MRI pending. I have not heard back from neurosurgery regarding the aneurysm.  I have given sign out to Dr. Clyde Canterbury will assume care of the patient for appropriate disposition. Final Clinical Impression(s) / ED Diagnoses Final diagnoses:  None    Rx / DC Orders ED Discharge Orders    None       Margarita Mail, PA-C 01/15/20 [id number]    Tegeler, Gwenyth Allegra, MD 01/15/20 [phone]

## 2020-01-14 NOTE — ED Notes (Signed)
Pt ambulates to RR without assistance. Spoke with CT about delay. Waiting for Creatine. Vernie Shanks, PA aware.

## 2020-01-14 NOTE — ED Notes (Signed)
To xray.  Pt states she was able to doze off but pain is returning at a "7".   No other gross sx noted.

## 2020-01-14 NOTE — ED Provider Notes (Signed)
  Physical Exam  BP (!) 150/93 (BP Location: Left Arm)   Pulse 85   Temp 98.6 F (37 C) (Oral)   Resp 20   SpO2 93%   Physical Exam Vitals and nursing note reviewed.  Constitutional:      General: She is not in acute distress.    Appearance: Normal appearance. She is well-developed. She is not ill-appearing.  HENT:     Head: Normocephalic and atraumatic.     Right Ear: External ear normal.     Left Ear: External ear normal.     Nose: Nose normal. No congestion or rhinorrhea.     Mouth/Throat:     Mouth: Mucous membranes are moist.     Pharynx: Oropharynx is clear.  Eyes:     Extraocular Movements: Extraocular movements intact.  Cardiovascular:     Rate and Rhythm: Normal rate and regular rhythm.  Pulmonary:     Effort: Pulmonary effort is normal. No respiratory distress.  Abdominal:     General: There is no distension.     Palpations: Abdomen is soft.  Musculoskeletal:        General: Normal range of motion.     Cervical back: Normal range of motion and neck supple.  Skin:    General: Skin is warm and dry.  Neurological:     Mental Status: She is alert and oriented to person, place, and time. Mental status is at baseline.  Psychiatric:        Mood and Affect: Mood normal.     ED Course/Procedures     Procedures  MDM   Assumed care from off going provider at 1500.  For details on ED course prior to handoff, please refer to previous team's note.  In brief, patient has experienced a headache for the past week.  She is also experiencing pain in her trapezius that radiates to her face and worsens with chewing.  CTA head and neck collected by previous team which showed a 3 x 5 mm aneurysm in the left ACA.  Neurosurgery team consulted.  Patient's care discussed with neurology team who recommended MRI.  Lab work unremarkable, including inflammatory markers.  Plan at handoff is to follow-up neurosurgery team's disposition and MRI.  MRI shows no acute intracranial  abnormality.  Touched base with neurology team who recommends outpatient follow-up with Dr. Jaynee Eagles for management of headaches.  Neurosurgery has evaluated patient and are recommending Medrol Dosepak and outpatient follow-up with both Dr. Ellene Route and Dr. Kathyrn Sheriff for follow-up for her headache and aneurysm.  They do not feel as though the aneurysm requires emergent intervention as her symptoms are not likely related to this finding.  Neurosurgery team prescribed outpatient course of pain medication.  Discussed this plan with patient and she is in agreement with plan.  Contact information for all outpatient appointments provided in discharge paperwork.  Patient appears stable for discharge.  Provided strict return precautions.  Patient assessed and evaluated with Dr. Johnney Killian.  Nadeen Landau, MD    Nadeen Landau, MD 01/15/20 Greggory Keen    Charlesetta Shanks, MD 01/22/20 [phone]

## 2020-01-14 NOTE — ED Triage Notes (Signed)
Pt c/o severe shooting pain from base of neck up side of R face and head. Pt states had a similar pain last year previous to having C6-7 discectomy. Pt tearful in triage.

## 2020-01-14 NOTE — ED Notes (Signed)
To MRI

## 2020-01-14 NOTE — ED Notes (Signed)
Pt continues to c/o pain, PA notified.  Pt reports that she is ready to go out on the streets to get meds.

## 2020-01-16 ENCOUNTER — Other Ambulatory Visit: Payer: Self-pay | Admitting: Family Medicine

## 2020-01-16 ENCOUNTER — Telehealth: Payer: Self-pay | Admitting: Family Medicine

## 2020-01-16 MED ORDER — ATORVASTATIN CALCIUM 40 MG PO TABS: 40.0000 mg | ORAL_TABLET | Freq: Every day | ORAL | 0 refills | Status: DC

## 2020-01-16 NOTE — Telephone Encounter (Signed)
BP was mildly elevated during ER visit, so I would prefer to continue amlodipine, be sure to take it at bedtime.  Recommend moving slowly, rapid rise from sitting position could cause dizziness. Continue monitoring BP regularly.  I am not sure what could be causing retro-ocular headache at this time, some of imaging findings could be contributing factors. If sudden worsening headache or acute MS changes she needs to call 911.  Eye exam is also recommended.  Head and neck imaging done during her recent ER visit show extensive, chronic vascular disease. There is a new infarct (stroke) that is not acute but new when compared with brain MRI done in 2019. We could arrange for brain MRI to evaluate in more detail or she can wait until she sees neurologist.  We need to better control her risk factors for further CVA.   Currently she is not on a statin medication, recommend considering atorvastatin 40 mg daily.  I do not see contraindications for antiplatelet therapy and head imaging did not show signs of bleeding' so she will benefit from aspirins 81 mg daily. Plavix may be more appropriate but will wait for neuro evaluation.  She needs to arrange appt with neurosurgeon to follow on brain aneurysm seen on head CT.  Neck imaging showed enlarged thyroid, she has history of multinodular goiter. She has been referred to endocrinologist, I do not see appt. I could go ahead and place thyroid US or she can wait until she sees endocrinologist.  Thanks, BJ

## 2020-01-16 NOTE — Telephone Encounter (Signed)
Spoke to [place] and she stated the amLODipine (NORVASC) 5 MG tablet makes the pt dizzy and lightheaded.  Last directed by Dr. [place] to take at night but daughter unsure if pt has tried.  Pt continues to complain of pain behind her eyes.  [name] mentioned sometimes the pt will talk but doesn't make sense.  Daughter is worried.  Please advise.  Also working on getting an appointment with Dr. Ellene Route.

## 2020-01-16 NOTE — Telephone Encounter (Signed)
Pt's daughter, [name], is requesting an appointment for a hospital follow up. Per daughter, [name], mom needs to see Dr.Jordan, for her blood pressure. Pt has an appt with her Neuro for a hospital follow up as well. Thanks

## 2020-01-16 NOTE — Telephone Encounter (Signed)
Spoke to [place] and informed her of the below message.  Pt has an upcoming appointment with Dr. Ellene Route on 01/18/2020.  Atorvastatin sent to the pharmacy by e-scribe.  [name] will follow up on referral to endo.  Gave her # to call.  Nothing further needed.  She will call back with BP readings in one week.

## 2020-01-17 ENCOUNTER — Telehealth: Payer: Self-pay | Admitting: *Deleted

## 2020-01-17 NOTE — Telephone Encounter (Signed)
  Follow up Call-  Call back number 01/13/2020  Post procedure Call Back phone  # [phone]  Permission to leave phone message Yes  Some recent data might be hidden     Patient questions:  Do you have a fever, pain , or abdominal swelling? No. Pain Score  0 *  Have you tolerated food without any problems? Yes.    Have you been able to return to your normal activities? Yes.    Do you have any questions about your discharge instructions: Diet   No. Medications  No. Follow up visit  No.  Do you have questions or concerns about your Care? No.  Actions: * If pain score is 4 or above: No action needed, pain <4  1. Have you developed a fever since your procedure? NO  2.   Have you had an respiratory symptoms (SOB or cough) since your procedure? NO  3.   Have you tested positive for COVID 19 since your procedure NO  4.   Have you had any family members/close contacts diagnosed with the COVID 19 since your procedure?  NO   If yes to any of these questions please route to Joylene John, RN and Erenest Rasher, RN

## 2020-01-17 NOTE — Telephone Encounter (Signed)
First attempt, mailbox is full and unable to leave message.

## 2020-01-17 NOTE — Telephone Encounter (Signed)
Pt stated that she is doing fine and no need for follow up.

## 2020-01-18 ENCOUNTER — Other Ambulatory Visit: Payer: Self-pay

## 2020-01-18 DIAGNOSIS — A048 Other specified bacterial intestinal infections: Secondary | ICD-10-CM

## 2020-01-18 DIAGNOSIS — D1391 Familial adenomatous polyposis: Secondary | ICD-10-CM

## 2020-01-18 DIAGNOSIS — D126 Benign neoplasm of colon, unspecified: Secondary | ICD-10-CM

## 2020-01-18 DIAGNOSIS — R519 Headache, unspecified: Secondary | ICD-10-CM | POA: Diagnosis not present

## 2020-01-18 DIAGNOSIS — I671 Cerebral aneurysm, nonruptured: Secondary | ICD-10-CM | POA: Diagnosis not present

## 2020-01-18 MED ORDER — AMOXICILLIN 500 MG PO CAPS: 1000.0000 mg | ORAL_CAPSULE | Freq: Two times a day (BID) | ORAL | 0 refills | Status: AC

## 2020-01-18 MED ORDER — METRONIDAZOLE 500 MG PO TABS: 500.0000 mg | ORAL_TABLET | Freq: Two times a day (BID) | ORAL | 0 refills | Status: AC

## 2020-01-18 MED ORDER — CLARITHROMYCIN 500 MG PO TABS: 500.0000 mg | ORAL_TABLET | Freq: Two times a day (BID) | ORAL | 0 refills | Status: AC

## 2020-01-18 MED ORDER — OMEPRAZOLE 20 MG PO CPDR: DELAYED_RELEASE_CAPSULE | ORAL | 0 refills | Status: DC

## 2020-01-23 ENCOUNTER — Telehealth: Payer: Self-pay | Admitting: Licensed Clinical Social Worker

## 2020-01-23 NOTE — Telephone Encounter (Signed)
Received a genetic counseling referral from Dr. Havery Moros for polyposis. Pt has been cld and scheduled to see Brianna on 4/15 at 11am. Appt date and time has been given to the pt's daughter.

## 2020-01-26 ENCOUNTER — Ambulatory Visit (INDEPENDENT_AMBULATORY_CARE_PROVIDER_SITE_OTHER): Payer: Medicare Other | Admitting: Endocrinology

## 2020-01-26 ENCOUNTER — Encounter: Payer: Self-pay | Admitting: Endocrinology

## 2020-01-26 ENCOUNTER — Other Ambulatory Visit: Payer: Self-pay

## 2020-01-26 VITALS — BP 120/62 | HR 109 | Ht 60.0 in | Wt 152.8 lb

## 2020-01-26 DIAGNOSIS — E042 Nontoxic multinodular goiter: Secondary | ICD-10-CM | POA: Diagnosis not present

## 2020-01-26 MED ORDER — METHIMAZOLE 5 MG PO TABS: 2.5000 mg | ORAL_TABLET | Freq: Three times a day (TID) | ORAL | 1 refills | Status: DC

## 2020-01-26 NOTE — Progress Notes (Signed)
Subjective:    Patient ID: Darlene Griffith, female    DOB: [birth year], 69 y.o.   MRN: [id number]  HPI Pt returns for f/u of hyperthyroidism (dx'ed 2009; 2019 US showed MNG). RAI was advised, but pt did not do).  pt states she feels well in general, except for fatigue.   Past Medical History:  Diagnosis Date  . Chest pain    a. 01/2015 Echo: Nl LV fxn, Gr 1 DD, triv AI, mild MR.  . Essential hypertension   . Hepatic cyst    a. noted on CT 01/2015.  [name] Hyperthyroidism    GOING TO [facility] FOR SECOND OPINION  . Multinodular goiter    a. 01/2015 CT chest: multinodular goidter w/ substernal extension of the left lobe of the thyroid assoc w/ rightward deviation of tracheal air column.  . Neck pain, chronic   . Pulmonary nodules    a. 01/2015 CT Chest: RLL ~ 23mm subpleural nodule - rec f/u in 6-12 mos.  [name] Splenic cyst    a. noted on CT 01/2015.    Past Surgical History:  Procedure Laterality Date  . CERVICAL DISC ARTHROPLASTY N/A 07/05/2019   Procedure: Cervical Six-Seven Artificial disc replacement;  Surgeon: Kristeen Miss, MD;  Location: [facility];  Service: Neurosurgery;  Laterality: N/A;  Cervical Six-Seven Artificial disc replacement  . CESAREAN SECTION    . COLONOSCOPY  01/13/2020  . TONSILLECTOMY     AROUND 5-6 YRS OLD  . UPPER GASTROINTESTINAL ENDOSCOPY  01/13/2020    Social History   Socioeconomic History  . Marital status: Single    Spouse name: Not on file  . Number of children: Not on file  . Years of education: Not on file  . Highest education level: Not on file  Occupational History  . Occupation: retired  Tobacco Use  . Smoking status: Current Some Day Smoker  . Smokeless tobacco: Never Used  . Tobacco comment: " long time ago"  Substance and Sexual Activity  . Alcohol use: Yes    Alcohol/week: 0.0 standard drinks    Comment: rare  . Drug use: No  . Sexual activity: Not on file  Other Topics Concern  . Not on file  Social History Narrative  . Not on file   Social  Determinants of Health   Financial Resource Strain:   . Difficulty of Paying Living Expenses:   Food Insecurity:   . Worried About Charity fundraiser in the Last Year:   . Arboriculturist in the Last Year:   Transportation Needs:   . Film/video editor (Medical):   [name] Lack of Transportation (Non-Medical):   Physical Activity:   . Days of Exercise per Week:   . Minutes of Exercise per Session:   Stress:   . Feeling of Stress :   Social Connections:   . Frequency of Communication with Friends and Family:   . Frequency of Social Gatherings with Friends and Family:   . Attends Religious Services:   . Active Member of Clubs or Organizations:   . Attends Archivist Meetings:   [name] Marital Status:   Intimate Partner Violence:   . Fear of Current or Ex-Partner:   . Emotionally Abused:   [name] Physically Abused:   . Sexually Abused:     Current Outpatient Medications on File Prior to Visit  Medication Sig Dispense Refill  . amLODipine (NORVASC) 5 MG tablet Take 1 tablet (5 mg total) by mouth daily. 30 tablet  3  . amoxicillin (AMOXIL) 500 MG capsule Take 2 capsules (1,000 mg total) by mouth 2 (two) times daily for 14 days. 56 capsule 0  . atorvastatin (LIPITOR) 40 MG tablet Take 1 tablet (40 mg total) by mouth daily. 90 tablet 0  . clarithromycin (BIAXIN) 500 MG tablet Take 1 tablet (500 mg total) by mouth 2 (two) times daily for 14 days. 28 tablet 0  . HYDROcodone-acetaminophen (NORCO/VICODIN) 5-325 MG tablet Take 1 tablet by mouth every 6 (six) hours as needed for moderate pain. 20 tablet 0  . metroNIDAZOLE (FLAGYL) 500 MG tablet Take 1 tablet (500 mg total) by mouth 2 (two) times daily for 14 days. 28 tablet 0  . omeprazole (PRILOSEC) 20 MG capsule Take 20 mg BID for 14 days, then take daily for 14 days then stop 42 capsule 0   No current facility-administered medications on file prior to visit.    No Known Allergies  Family History  Problem Relation Age of Onset  .  Heart attack Maternal Grandmother        deceased  . High blood pressure Mother   . High Cholesterol Mother   . Thyroid disease Mother   . Thyroid disease Sister   . Colon cancer Neg Hx   . Colon polyps Neg Hx   . Esophageal cancer Neg Hx   . Rectal cancer Neg Hx   . Stomach cancer Neg Hx     BP 120/62   Pulse (!) 109   Ht 5' (1.524 m)   Wt 152 lb 12.8 oz (69.3 kg)   SpO2 95%   BMI 29.84 kg/m    Review of Systems Denies fever    Objective:   Physical Exam VITAL SIGNS:  See vs page GENERAL: no distress NECK: small multinodular goiter is again noted (R>L).     Lab Results  Component Value Date   TSH 0.26 (L) 12/02/2019       Assessment & Plan:  MNG, clinically stable Hyperthyroidism, due to the goiter. We discussed rx options.  Tachycardia.  In this setting, I advised rx.    Patient Instructions  I have sent a prescription to your pharmacy, to slow the thyroid. If ever you have fever while taking methimazole, stop it and call us, even if the reason is obvious, because of the risk of a rare side-effect.   It is best to never miss the medication.  However, if you do miss it, next best is to double up the next time.   Please come back for a follow-up appointment in 6-8 weeks.

## 2020-01-26 NOTE — Patient Instructions (Addendum)
I have sent a prescription to your pharmacy, to slow the thyroid. If ever you have fever while taking methimazole, stop it and call us, even if the reason is obvious, because of the risk of a rare side-effect.   It is best to never miss the medication.  However, if you do miss it, next best is to double up the next time.   Please come back for a follow-up appointment in 6-8 weeks.

## 2020-02-02 ENCOUNTER — Inpatient Hospital Stay: Payer: Medicare Other | Attending: Genetic Counselor | Admitting: Licensed Clinical Social Worker

## 2020-02-02 ENCOUNTER — Inpatient Hospital Stay: Payer: Medicare Other

## 2020-02-02 ENCOUNTER — Other Ambulatory Visit: Payer: Self-pay

## 2020-02-02 ENCOUNTER — Encounter: Payer: Self-pay | Admitting: Licensed Clinical Social Worker

## 2020-02-02 DIAGNOSIS — Z860100 Personal history of colon polyps, unspecified: Secondary | ICD-10-CM | POA: Insufficient documentation

## 2020-02-02 DIAGNOSIS — Z801 Family history of malignant neoplasm of trachea, bronchus and lung: Secondary | ICD-10-CM

## 2020-02-02 DIAGNOSIS — Z1379 Encounter for other screening for genetic and chromosomal anomalies: Secondary | ICD-10-CM

## 2020-02-02 NOTE — Progress Notes (Signed)
REFERRING PROVIDER: Yetta Flock, MD [address] [place],  [place] [address]  PRIMARY PROVIDER:  Martinique, Betty G, MD  PRIMARY REASON FOR VISIT:  1. Family history of lung cancer   2. Personal history of colonic polyps     HISTORY OF PRESENT ILLNESS:   Darlene Griffith, a 69 y.o. female, was seen for a [facility] cancer genetics consultation at the request of Dr. Havery Moros due to a personal history of colon polyps.  Darlene Griffith presents to clinic today to discuss the possibility of a hereditary predisposition to cancer, genetic testing, and to further clarify her future cancer risks, as well as potential cancer risks for family members.    Darlene Griffith is a 69 y.o. female with no personal history of cancer.  She had her first colonoscopy in 2021 which revealed 23 polyps, mostly tubular adenomas.   CANCER HISTORY:  Oncology History   No history exists.     RISK FACTORS:  Menarche was at age 14.  First live birth at age 43.  OCP use for approximately 0 years.  Ovaries intact: yes.  Hysterectomy: no.  Menopausal status: postmenopausal.  HRT use: 0 years. Colonoscopy: yes; 23 polyps. Number of breast biopsies: 0. Any excessive radiation exposure in the past: no  Past Medical History:  Diagnosis Date  . Chest pain    a. 01/2015 Echo: Nl LV fxn, Gr 1 DD, triv AI, mild MR.  . Essential hypertension   . Family history of lung cancer   . Hepatic cyst    a. noted on CT 01/2015.  [name] Hyperthyroidism    GOING TO [facility] FOR SECOND OPINION  . Multinodular goiter    a. 01/2015 CT chest: multinodular goidter w/ substernal extension of the left lobe of the thyroid assoc w/ rightward deviation of tracheal air column.  . Neck pain, chronic   . Personal history of colonic polyps   . Pulmonary nodules    a. 01/2015 CT Chest: RLL ~ 30m subpleural nodule - rec f/u in 6-12 mos.  .Marland KitchenSplenic cyst    a. noted on CT 01/2015.    Past Surgical History:  Procedure Laterality Date  . CERVICAL DISC  ARTHROPLASTY N/A 07/05/2019   Procedure: Cervical Six-Seven Artificial disc replacement;  Surgeon: EKristeen Miss MD;  Location: [facility]  Service: Neurosurgery;  Laterality: N/A;  Cervical Six-Seven Artificial disc replacement  . CESAREAN SECTION    . COLONOSCOPY  01/13/2020  . TONSILLECTOMY     AROUND 5-6 YRS OLD  . UPPER GASTROINTESTINAL ENDOSCOPY  01/13/2020    Social History   Socioeconomic History  . Marital status: Single    Spouse name: Not on file  . Number of children: Not on file  . Years of education: Not on file  . Highest education level: Not on file  Occupational History  . Occupation: retired  Tobacco Use  . Smoking status: Current Some Day Smoker  . Smokeless tobacco: Never Used  . Tobacco comment: " long time ago"  Substance and Sexual Activity  . Alcohol use: Yes    Alcohol/week: 0.0 standard drinks    Comment: rare  . Drug use: No  . Sexual activity: Not on file  Other Topics Concern  . Not on file  Social History Narrative  . Not on file   Social Determinants of Health   Financial Resource Strain:   . Difficulty of Paying Living Expenses:   Food Insecurity:   . Worried About REstate manager/land agent  of Food in the Last Year:   . [place] in the Last Year:   Transportation Needs:   . Lack of Transportation (Medical):   [name] Lack of Transportation (Non-Medical):   Physical Activity:   . Days of Exercise per Week:   . Minutes of Exercise per Session:   Stress:   . Feeling of Stress :   Social Connections:   . Frequency of Communication with Friends and Family:   . Frequency of Social Gatherings with Friends and Family:   . Attends Religious Services:   . Active Member of Clubs or Organizations:   . Attends Archivist Meetings:   [name] Marital Status:      FAMILY HISTORY:  We obtained a detailed, 4-generation family history.  Significant diagnoses are listed below: Family History  Problem Relation Age of Onset  . Heart attack Maternal  Grandmother        deceased  . High blood pressure Mother   . High Cholesterol Mother   . Thyroid disease Mother   . Thyroid disease Sister   . Lung cancer Father   . Cancer Maternal Uncle        unk type  . Cancer Sister        unk type  . Colon cancer Neg Hx   . Colon polyps Neg Hx   . Esophageal cancer Neg Hx   . Rectal cancer Neg Hx   . Stomach cancer Neg Hx    Darlene Griffith has 3 daughters and 1 son, no cancers. She has 2 maternal half sisters, one did have cancer and died at 25 but she is unsure the type. No polyps that she is aware of in her sisters.   Darlene Griffith mother is living at 75 with no cancer or polyp history. Patient has 3 maternal aunts and 5 maternal uncles. One uncle had cancer but patient is unsure type. No known cancers in maternal cousins, but she has limited information about them. Maternal grandparents both passed in their 88s due to heart attacks.  Darlene Griffith father died of lung cancer in his 7s. She has no other information about his family.   Darlene Griffith is unaware of previous family history of genetic testing for hereditary cancer risks. Patient's maternal ancestors are of Black descent, and paternal ancestors are of Black/Caucasian descent. There is no reported Ashkenazi Jewish ancestry. There is no known consanguinity.  GENETIC COUNSELING ASSESSMENT: Darlene Griffith is a 69 y.o. female with a personal history of colon polyps which is somewhat suggestive of a hereditary polyposis conditon and predisposition to cancer. We, therefore, discussed and recommended the following at today's visit.   DISCUSSION: We discussed that polyps in general are common, however, most people have fewer than 5 lifetime polyps.  When an individual has 10 or more polyps we become concerned about an underlying polyposis syndrome.  The most common hereditary polyposis syndromes are caused by problems in the APC and MUTYH genes, however, more recently, mutations in the [employer] and MSH3 genes have  been identified in some polyposis families.    We discussed that testing is beneficial for several reasons including knowing how to follow individuals for cancer screenings and understand if other family members could be at risk for cancer and allow them to undergo genetic testing.   We reviewed the characteristics, features and inheritance patterns of hereditary cancer syndromes. We also discussed genetic testing, including the appropriate family members to test, the process of testing, insurance  coverage and turn-around-time for results. We discussed the implications of a negative, positive and/or variant of uncertain significant result. We recommended Ms. Villanueva pursue genetic testing for the Multi-Cancer gene panel.  The Multi-Cancer Panel offered by [facility] includes sequencing and/or deletion duplication testing of the following 85 genes: AIP, ALK, APC, ATM, AXIN2,BAP1,  BARD1, BLM, BMPR1A, BRCA1, BRCA2, BRIP1, CASR, CDC73, CDH1, CDK4, CDKN1B, CDKN1C, CDKN2A (p14ARF), CDKN2A (p16INK4a), CEBPA, CHEK2, CTNNA1, DICER1, DIS3L2, EGFR (c.2369C>T, p.Thr790Met variant only), EPCAM (Deletion/duplication testing only), FH, FLCN, GATA2, GPC3, GREM1 (Promoter region deletion/duplication testing only), HOXB13 (c.251G>A, p.Gly84Glu), HRAS, KIT, MAX, MEN1, MET, MITF (c.952G>A, p.Glu318Lys variant only), MLH1, MSH2, MSH3, MSH6, MUTYH, NBN, NF1, NF2, NTHL1, PALB2, PDGFRA, PHOX2B, PMS2, POLD1, POLE, POT1, PRKAR1A, PTCH1, PTEN, RAD50, RAD51C, RAD51D, RB1, RECQL4, RET, RNF43, RUNX1, SDHAF2, SDHA (sequence changes only), SDHB, SDHC, SDHD, SMAD4, SMARCA4, SMARCB1, SMARCE1, STK11, SUFU, TERC, TERT, TMEM127, TP53, TSC1, TSC2, VHL, WRN and WT1.    Based on Ms. Moyers's personal history of colon polyps, she meets medical criteria for genetic testing. Despite that she meets criteria, she may still have an out of pocket cost.   We discussed that some people do not want to undergo genetic testing due to fear of genetic  discrimination.  A federal law called the Genetic Information Non-Discrimination Act (GINA) of 2008 helps protect individuals against genetic discrimination based on their genetic test results.  It impacts both health insurance and employment.  For health insurance, it protects against increased premiums, being kicked off insurance or being forced to take a test in order to be insured.  For employment it protects against hiring, firing and promoting decisions based on genetic test results.  Health status due to a cancer diagnosis is not protected under GINA.  This law does not protect life insurance, disability insurance, or other types of insurance.   PLAN: After considering the risks, benefits, and limitations, Ms. Morino provided informed consent to pursue genetic testing and the blood sample was sent to [facility] for analysis of the Multi-Cancer Panel. Results should be available within approximately 2-3 weeks' time, at which point they will be disclosed by telephone to Ms. Mccaslin, as will any additional recommendations warranted by these results. Ms. Qualls will receive a summary of her genetic counseling visit and a copy of her results once available. This information will also be available in Epic.   Ms. Vasconez questions were answered to her satisfaction today. Our contact information was provided should additional questions or concerns arise. Thank you for the referral and allowing [place] to share in the care of your patient.   Faith Rogue, MS, [facility] Genetic Counselor [email or web address] Phone: [phone]  The patient was seen for a total of 40 minutes in face-to-face genetic counseling.  Her daughter Denton Ar was also present. Drs. Magrinat, Lindi Adie and/or Burr Medico were available for discussion regarding this case.   _______________________________________________________________________ For Office Staff:  Number of people involved in session: 2 Was an Intern/ student involved  with case: no

## 2020-02-10 ENCOUNTER — Encounter: Payer: Self-pay | Admitting: Licensed Clinical Social Worker

## 2020-02-10 ENCOUNTER — Telehealth: Payer: Self-pay | Admitting: Licensed Clinical Social Worker

## 2020-02-10 ENCOUNTER — Ambulatory Visit: Payer: Self-pay | Admitting: Licensed Clinical Social Worker

## 2020-02-10 DIAGNOSIS — Z86010 Personal history of colonic polyps: Secondary | ICD-10-CM

## 2020-02-10 DIAGNOSIS — Z1379 Encounter for other screening for genetic and chromosomal anomalies: Secondary | ICD-10-CM | POA: Insufficient documentation

## 2020-02-10 DIAGNOSIS — Z801 Family history of malignant neoplasm of trachea, bronchus and lung: Secondary | ICD-10-CM

## 2020-02-10 NOTE — Telephone Encounter (Signed)
Revealed negative genetic testing.  We discussed that we do not know why she has colon polyps or why there is cancer in the family. It could be due to a different gene that we are not testing, or something our current technology cannot pick up.  It will be important for her to keep in contact with genetics to learn if additional testing may be needed in the future.

## 2020-02-10 NOTE — Progress Notes (Signed)
HPI:  [name] was previously seen in the [facility] clinic due to a personal history of colon polyps and concerns regarding a hereditary predisposition to cancer. Please refer to our prior cancer genetics clinic note for more information regarding our discussion, assessment and recommendations, at the time. [name] recent genetic test results were disclosed to her, as were recommendations warranted by these results. These results and recommendations are discussed in more detail below.  CANCER HISTORY:  Oncology History   No history exists.    FAMILY HISTORY:  We obtained a detailed, 4-generation family history.  Significant diagnoses are listed below: Family History  Problem Relation Age of Onset  . Heart attack Maternal Grandmother        deceased  . High blood pressure Mother   . High Cholesterol Mother   . Thyroid disease Mother   . Thyroid disease Sister   . Lung cancer Father   . Cancer Maternal Uncle        unk type  . Cancer Sister        unk type  . Colon cancer Neg Hx   . Colon polyps Neg Hx   . Esophageal cancer Neg Hx   . Rectal cancer Neg Hx   . Stomach cancer Neg Hx    [name] has 3 daughters and 1 son, no cancers. She has 2 maternal half sisters, one did have cancer and died at 20 but she is unsure the type. No polyps that she is aware of in her sisters.   [name] mother is living at 3 with no cancer or polyp history. Patient has 3 maternal aunts and 5 maternal uncles. One uncle had cancer but patient is unsure type. No known cancers in maternal cousins, but she has limited information about them. Maternal grandparents both passed in their 19s due to heart attacks.  [name] father died of lung cancer in his 15s. She has no other information about his family.   [name] is unaware of previous family history of genetic testing for hereditary cancer risks. Patient's maternal ancestors are of Black descent, and paternal ancestors are  of Black/Caucasian descent. There is no reported Ashkenazi Jewish ancestry. There is no known consanguinity.   GENETIC TEST RESULTS: Genetic testing reported out on 02/10/2020 through the [facility]- cancer panel found no pathogenic mutations.   The Multi-Cancer Panel offered by [facility] includes sequencing and/or deletion duplication testing of the following 85 genes: AIP, ALK, APC, ATM, AXIN2,BAP1,  BARD1, BLM, BMPR1A, BRCA1, BRCA2, BRIP1, CASR, CDC73, CDH1, CDK4, CDKN1B, CDKN1C, CDKN2A (p14ARF), CDKN2A (p16INK4a), CEBPA, CHEK2, CTNNA1, DICER1, DIS3L2, EGFR (c.2369C>T, p.Thr790Met variant only), EPCAM (Deletion/duplication testing only), FH, FLCN, GATA2, GPC3, GREM1 (Promoter region deletion/duplication testing only), HOXB13 (c.251G>A, p.Gly84Glu), HRAS, KIT, MAX, MEN1, MET, MITF (c.952G>A, p.Glu318Lys variant only), MLH1, MSH2, MSH3, MSH6, MUTYH, NBN, NF1, NF2, NTHL1, PALB2, PDGFRA, PHOX2B, PMS2, POLD1, POLE, POT1, PRKAR1A, PTCH1, PTEN, RAD50, RAD51C, RAD51D, RB1, RECQL4, RET, RNF43, RUNX1, SDHAF2, SDHA (sequence changes only), SDHB, SDHC, SDHD, SMAD4, SMARCA4, SMARCB1, SMARCE1, STK11, SUFU, TERC, TERT, TMEM127, TP53, TSC1, TSC2, VHL, WRN and WT1.   The test report has been scanned into EPIC and is located under the Molecular Pathology section of the Results Review tab.  A portion of the result report is included below for reference.     We discussed with [name] that because current genetic testing is not perfect, it is possible there may be a gene mutation in one of these  genes that current testing cannot detect, but that chance is small.  We also discussed, that there could be another gene that has not yet been discovered, or that we have not yet tested, that is responsible for the cancer diagnoses in the family. It is also possible there is a hereditary cause for the cancer in the family that [name] did not inherit and therefore was not identified in her testing.  Therefore, it is important  to remain in touch with cancer genetics in the future so that we can continue to offer [name] the most up to date genetic testing.   ADDITIONAL GENETIC TESTING: We discussed with [name] that her genetic testing was fairly extensive.  If there are genes identified to increase cancer risk that can be analyzed in the future, we would be happy to discuss and coordinate this testing at that time.    CANCER SCREENING RECOMMENDATIONS: [name] test result is considered negative (normal).  This means that we have not identified a hereditary cause for her personal history of polyps and family history of cancer at this time.   While reassuring, this does not definitively rule out a hereditary predisposition to cancer. It is still possible that there could be genetic mutations that are undetectable by current technology. There could be genetic mutations in genes that have not been tested or identified to increase cancer risk.  Therefore, it is recommended she continue to follow the cancer management and screening guidelines provided by her primary healthcare provider.   An individual's cancer risk and medical management are not determined by genetic test results alone. Overall cancer risk assessment incorporates additional factors, including personal medical history, family history, and any available genetic information that may result in a personalized plan for cancer prevention and surveillance.  This negative genetic test simply tells [place] that we cannot yet define why [name] has had  an increased number of colorectal polyps.[name] medical management and screening should be based on the prospect that she will likely form more colon polyps and should, therefore, undergo more frequent colonoscopy screening at intervals determined by her GI providers.  We also recommended that [name] have an upper endoscopy periodically.  RECOMMENDATIONS FOR FAMILY MEMBERS:  Relatives in this family might be at  some increased risk of developing cancer, over the general population risk, simply due to the family history of cancer.  We recommended female relatives in this family have a yearly mammogram beginning at age 50, or 50 years younger than the earliest onset of cancer, an annual clinical breast exam, and perform monthly breast self-exams. Female relatives in this family should also have a gynecological exam as recommended by their primary provider. All family members should have a colonoscopy by age 29, or as directed by their physicians.  FOLLOW-UP: Lastly, we discussed with [name] that cancer genetics is a rapidly advancing field and it is possible that new genetic tests will be appropriate for her and/or her family members in the future. We encouraged her to remain in contact with cancer genetics on an annual basis so we can update her personal and family histories and let her know of advances in cancer genetics that may benefit this family.   Our contact number was provided. [name] questions were answered to her satisfaction, and she knows she is welcome to call us at anytime with additional questions or concerns.   Faith Rogue, MS, [facility] Genetic Counselor [email or web address] Phone: [phone]

## 2020-02-17 DIAGNOSIS — G500 Trigeminal neuralgia: Secondary | ICD-10-CM | POA: Diagnosis not present

## 2020-02-17 DIAGNOSIS — M1612 Unilateral primary osteoarthritis, left hip: Secondary | ICD-10-CM | POA: Diagnosis not present

## 2020-02-17 DIAGNOSIS — M48062 Spinal stenosis, lumbar region with neurogenic claudication: Secondary | ICD-10-CM | POA: Diagnosis not present

## 2020-02-17 DIAGNOSIS — M47816 Spondylosis without myelopathy or radiculopathy, lumbar region: Secondary | ICD-10-CM | POA: Diagnosis not present

## 2020-02-27 DIAGNOSIS — M545 Low back pain: Secondary | ICD-10-CM | POA: Diagnosis not present

## 2020-02-27 DIAGNOSIS — M25551 Pain in right hip: Secondary | ICD-10-CM | POA: Diagnosis not present

## 2020-02-27 DIAGNOSIS — M25512 Pain in left shoulder: Secondary | ICD-10-CM | POA: Diagnosis not present

## 2020-02-27 DIAGNOSIS — M25511 Pain in right shoulder: Secondary | ICD-10-CM | POA: Diagnosis not present

## 2020-02-27 DIAGNOSIS — M25552 Pain in left hip: Secondary | ICD-10-CM | POA: Diagnosis not present

## 2020-02-29 DIAGNOSIS — M1612 Unilateral primary osteoarthritis, left hip: Secondary | ICD-10-CM | POA: Diagnosis not present

## 2020-02-29 DIAGNOSIS — M19011 Primary osteoarthritis, right shoulder: Secondary | ICD-10-CM | POA: Diagnosis not present

## 2020-03-01 ENCOUNTER — Other Ambulatory Visit: Payer: Self-pay

## 2020-03-02 ENCOUNTER — Encounter: Payer: Self-pay | Admitting: Internal Medicine

## 2020-03-02 ENCOUNTER — Ambulatory Visit (INDEPENDENT_AMBULATORY_CARE_PROVIDER_SITE_OTHER): Payer: Medicare Other | Admitting: Family Medicine

## 2020-03-02 ENCOUNTER — Encounter: Payer: Self-pay | Admitting: Family Medicine

## 2020-03-02 ENCOUNTER — Ambulatory Visit (INDEPENDENT_AMBULATORY_CARE_PROVIDER_SITE_OTHER): Payer: Medicare Other | Admitting: Internal Medicine

## 2020-03-02 VITALS — BP 138/94 | HR 84 | Temp 98.0°F | Resp 16 | Ht 60.0 in | Wt 141.0 lb

## 2020-03-02 VITALS — BP 140/90 | HR 81 | Temp 98.6°F | Ht 60.0 in | Wt 150.0 lb

## 2020-03-02 DIAGNOSIS — M542 Cervicalgia: Secondary | ICD-10-CM

## 2020-03-02 DIAGNOSIS — R0683 Snoring: Secondary | ICD-10-CM | POA: Diagnosis not present

## 2020-03-02 DIAGNOSIS — N3941 Urge incontinence: Secondary | ICD-10-CM

## 2020-03-02 DIAGNOSIS — R5382 Chronic fatigue, unspecified: Secondary | ICD-10-CM | POA: Diagnosis not present

## 2020-03-02 DIAGNOSIS — Z720 Tobacco use: Secondary | ICD-10-CM

## 2020-03-02 DIAGNOSIS — G4733 Obstructive sleep apnea (adult) (pediatric): Secondary | ICD-10-CM

## 2020-03-02 DIAGNOSIS — I10 Essential (primary) hypertension: Secondary | ICD-10-CM | POA: Diagnosis not present

## 2020-03-02 DIAGNOSIS — M1612 Unilateral primary osteoarthritis, left hip: Secondary | ICD-10-CM | POA: Diagnosis not present

## 2020-03-02 DIAGNOSIS — M25551 Pain in right hip: Secondary | ICD-10-CM

## 2020-03-02 MED ORDER — OXYBUTYNIN CHLORIDE ER 5 MG PO TB24: 5.0000 mg | ORAL_TABLET | Freq: Every day | ORAL | 1 refills | Status: DC

## 2020-03-02 MED ORDER — AMLODIPINE BESYLATE 5 MG PO TABS: 7.5000 mg | ORAL_TABLET | Freq: Every day | ORAL | 1 refills | Status: DC

## 2020-03-02 MED ORDER — TRAMADOL HCL 50 MG PO TABS: 50.0000 mg | ORAL_TABLET | Freq: Two times a day (BID) | ORAL | 0 refills | Status: AC | PRN

## 2020-03-02 NOTE — Assessment & Plan Note (Signed)
Odor of tobacco. Likely smokes more than she reported.  Plan- emphasis on smoking cessation. Consider need for PFT.

## 2020-03-02 NOTE — Patient Instructions (Signed)
A few things to remember from today's visit:   Urge incontinence of urine - Plan: oxybutynin (DITROPAN-XL) 5 MG 24 hr tablet  Essential hypertension  Hip pain, right - Plan: traMADol (ULTRAM) 50 MG tablet  Tramadol cannot be refill , if pain continues follow with ortho. Monitor blood pressure, goal < 140/90. Amlodipine increased from 5 mg to 7.5 mg (1.5 tab) at bedtime.  If you need refills please call your pharmacy. Do not use My Chart to request refills or for acute issues that need immediate attention.    Please be sure medication list is accurate. If a new problem present, please set up appointment sooner than planned today.

## 2020-03-02 NOTE — Progress Notes (Signed)
HPI:  [name] is a 68 y.o. female, who is here today for 3 months follow up.   She was last seen on 12/02/19 Since her last visit she has followed with GI and endocrinologist. She also has been in the ER c/o headache, 01/14/2020. Right-sided cervical ,hemi cranial headache. Constant , sharp pain, no radiated.  Brain MRI:  1. No acute intracranial abnormality on this motion degraded study. 2. Negative internal auditory canal imaging. 3. Moderately extensive chronic small vessel ischemic disease with chronic infarcts in the right basal ganglia.  It was recommended to follow with Dr Jaynee Eagles, neurologist. She was discharged on Hydrocodone-Acetaminophen 5-325 mg.  She is asking for "somethig" that helps with fatigue. Hx of OSA, today she has an appt with pulmonologist. She sees Dr Ellene Route for cervical pain. S/P cervical spine surgery, 07/05/19,cervical six-seven artificial disc replacement. According to pt, she was taken off from work because dizziness.  BP was 150/93 during ER visit. Home BP's "better" 140's/90's, states that this BP is good for her. She is on Amlodipine 5 mg daily. visual changes, chest pain, dyspnea, palpitation, focal weakness, or edema.  Lab Results  Component Value Date   CREATININE 0.40 (L) 01/14/2020   BUN 5 (L) 01/14/2020   NA 142 01/14/2020   K 3.8 01/14/2020   CL 110 01/14/2020   CO2 24 01/14/2020   Requesting sometime for pain. Right hip pain,severe,no radiated. Pain exacerbated by movement and walking. Limiting daily activities. Follows with ortho.  She had similar pain in left hip and greatly improved after hip injection.  Urinary frequency. + Urgency and sometimes incontinence. She did not receive Oxybutynin 5 mg. She did not receive medication at her pharmacy. Problem stable. Denies dysuria, gross hematuria,or decreased urine output.  Review of Systems  Constitutional: Negative for activity change, appetite change and  fever.  HENT: Negative for mouth sores, nosebleeds and sore throat.   Respiratory: Negative for cough and wheezing.   Gastrointestinal: Negative for abdominal pain, nausea and vomiting.       Negative for changes in bowel habits.  Musculoskeletal: Positive for arthralgias and neck pain. Negative for gait problem.  Neurological: Negative for syncope, facial asymmetry and weakness.  Psychiatric/Behavioral: Negative for confusion. The patient is nervous/anxious.   Rest of ROS, see pertinent positives sand negatives in HPI  Current Outpatient Medications on File Prior to Visit  Medication Sig Dispense Refill  . atorvastatin (LIPITOR) 40 MG tablet Take 1 tablet (40 mg total) by mouth daily. 90 tablet 0  . HYDROcodone-acetaminophen (NORCO/VICODIN) 5-325 MG tablet Take 1 tablet by mouth every 6 (six) hours as needed for moderate pain. 20 tablet 0  . methimazole (TAPAZOLE) 5 MG tablet Take 0.5 tablets (2.5 mg total) by mouth 3 (three) times daily. 45 tablet 1  . omeprazole (PRILOSEC) 20 MG capsule Take 20 mg BID for 14 days, then take daily for 14 days then stop 42 capsule 0   No current facility-administered medications on file prior to visit.    Past Medical History:  Diagnosis Date  . Chest pain    a. 01/2015 Echo: Nl LV fxn, Gr 1 DD, triv AI, mild MR.  . Essential hypertension   . Family history of lung cancer   . Hepatic cyst    a. noted on CT 01/2015.  [name] Hyperthyroidism    GOING TO [facility] FOR SECOND OPINION  . Multinodular goiter    a. 01/2015 CT chest: multinodular goidter w/ substernal extension  of the left lobe of the thyroid assoc w/ rightward deviation of tracheal air column.  . Neck pain, chronic   . Personal history of colonic polyps   . Pulmonary nodules    a. 01/2015 CT Chest: RLL ~ 19mm subpleural nodule - rec f/u in 6-12 mos.  [name] Splenic cyst    a. noted on CT 01/2015.   No Known Allergies  Social History   Socioeconomic History  . Marital status: Single    Spouse name:  Not on file  . Number of children: Not on file  . Years of education: Not on file  . Highest education level: Not on file  Occupational History  . Occupation: retired  Tobacco Use  . Smoking status: Light Tobacco Smoker  . Smokeless tobacco: Never Used  . Tobacco comment: 1-2 NOT EVERY DAY   Substance and Sexual Activity  . Alcohol use: Yes    Alcohol/week: 0.0 standard drinks    Comment: rare  . Drug use: No  . Sexual activity: Not on file  Other Topics Concern  . Not on file  Social History Narrative  . Not on file   Social Determinants of Health   Financial Resource Strain:   . Difficulty of Paying Living Expenses:   Food Insecurity:   . Worried About Charity fundraiser in the Last Year:   . Arboriculturist in the Last Year:   Transportation Needs:   . Film/video editor (Medical):   [name] Lack of Transportation (Non-Medical):   Physical Activity:   . Days of Exercise per Week:   . Minutes of Exercise per Session:   Stress:   . Feeling of Stress :   Social Connections:   . Frequency of Communication with Friends and Family:   . Frequency of Social Gatherings with Friends and Family:   . Attends Religious Services:   . Active Member of Clubs or Organizations:   . Attends Archivist Meetings:   [name] Marital Status:    Vitals:   03/02/20 1032  BP: (!) 138/94  Pulse: 84  Resp: 16  Temp: 98 F (36.7 C)  SpO2: 96%   Body mass index is 27.54 kg/m.  Physical Exam  Nursing note and vitals reviewed. Constitutional: She is oriented to person, place, and time. She appears well-developed. No distress.  HENT:  Head: Normocephalic and atraumatic.  Mouth/Throat: Oropharynx is clear and moist and mucous membranes are normal.  Eyes: Pupils are equal, round, and reactive to light. Conjunctivae are normal.  Cardiovascular: Normal rate and regular rhythm.  No murmur heard. Pulses:      Dorsalis pedis pulses are 2+ on the right side and 2+ on the left side.    Respiratory: Effort normal and breath sounds normal. No respiratory distress.  GI: Soft. She exhibits no mass. There is no hepatomegaly. There is no abdominal tenderness.  Musculoskeletal:        General: No edema.     Right hip: Tenderness present. Normal range of motion.     Comments: Antalgic gait.  Lymphadenopathy:    She has no cervical adenopathy.  Neurological: She is alert and oriented to person, place, and time. She has normal strength. No cranial nerve deficit. Gait normal.  Skin: Skin is warm. No rash noted. No erythema.  Psychiatric: She has a normal mood and affect.  Well groomed, good eye contact.   ASSESSMENT AND PLAN:  Ms. BURKE HEISE was seen today for 3 months  follow-up.  1. Hip pain, right We discussed current recommendations in regard to controlled meds for pain management. States that she has an appt with ortho next week. She understands Tramadol, Rx is not to be refilled.  - traMADol (ULTRAM) 50 MG tablet; Take 1 tablet (50 mg total) by mouth every 12 (twelve) hours as needed for up to 5 days.  Dispense: 10 tablet; Refill: 0  2. Urge incontinence of urine Some side effects of Oxybutynin discussed. Rx sent.  - oxybutynin (DITROPAN-XL) 5 MG 24 hr tablet; Take 1 tablet (5 mg total) by mouth at bedtime.  Dispense: 90 tablet; Refill: 1  3. Essential hypertension BP improved but still not at goal. After discussing options, she agrees with increasing dose of Amlodipine from 5 mg to 7.5 mg at night. Possible complications of elevated BP discussed. Continue monitoring B P regularly. Low salt diet also recommended.  - amLODipine (NORVASC) 5 MG tablet; Take 1.5 tablets (7.5 mg total) by mouth daily.  Dispense: 100 tablet; Refill: 1  4. Neck pain on right side Still c/o severe pain. Following with Dr Ellene Route.  5. Chronic fatigue Possible etiologies discussed. She has an appt today to discussed CPAP. Encouraged to wear CPAP , it will help with  fatigue.   Return in about 4 months (around 07/03/2020) for HTN,HLD.   Shawntay Prest G. Martinique, MD  [facility] [place] office.   A few things to remember from today's visit:   Urge incontinence of urine - Plan: oxybutynin (DITROPAN-XL) 5 MG 24 hr tablet  Essential hypertension  Hip pain, right - Plan: traMADol (ULTRAM) 50 MG tablet  Tramadol cannot be refill , if pain continues follow with ortho. Monitor blood pressure, goal < 140/90. Amlodipine increased from 5 mg to 7.5 mg (1.5 tab) at bedtime.  If you need refills please call your pharmacy. Do not use My Chart to request refills or for acute issues that need immediate attention.    Please be sure medication list is accurate. If a new problem present, please set up appointment sooner than planned today.

## 2020-03-02 NOTE — Patient Instructions (Signed)
Order- schedule HST  Please call us about 2 weeks after your sleep test to see if results and recommendations are ready yet. If appropriate, we may be able to start treatment before we see you next.

## 2020-03-02 NOTE — Assessment & Plan Note (Signed)
Significant snoring and fatigue. High probability for OSA. Appropriate discussion including driving safety, evaluation, treatment options.  Plan- sleep study. CPAP if needed.

## 2020-03-02 NOTE — Progress Notes (Signed)
03/02/20- 69 yo F Smoker for sleep evaluation courtesy of Dr Martinique. Medical problem list includes Aortic stenosis, HTN, Multinodular Goiter, Cervical Spondylosis, Chronic Fatigue, Hyperlipidemia,  -----OSA per Dr.Jordan 2 Phizer Covax Epworth score 15 Body weight today 150 lbs Family tells her of loud snoring. Frequent waking. Always tired in daytime. Coffee has little effect.  ENT surgery+ tonsils. Had C-spine surgery, anterior approach. Goiter displaces trachea. Irregular bedtime, up 6-7AM.   Filed Weights   03/02/20 1144  Weight: 150 lb (68 kg)     Prior to Admission medications   Medication Sig Start Date End Date Taking? Authorizing Provider  amLODipine (NORVASC) 5 MG tablet Take 1.5 tablets (7.5 mg total) by mouth daily. 03/02/20  Yes Martinique, Betty G, MD  atorvastatin (LIPITOR) 40 MG tablet Take 1 tablet (40 mg total) by mouth daily. 01/16/20  Yes Martinique, Betty G, MD  HYDROcodone-acetaminophen (NORCO/VICODIN) 5-325 MG tablet Take 1 tablet by mouth every 6 (six) hours as needed for moderate pain. 01/14/20 01/13/21 Yes Viona Gilmore D, NP  methimazole (TAPAZOLE) 5 MG tablet Take 0.5 tablets (2.5 mg total) by mouth 3 (three) times daily. 01/26/20  Yes Renato Shin, MD  omeprazole (PRILOSEC) 20 MG capsule Take 20 mg BID for 14 days, then take daily for 14 days then stop 01/18/20  Yes Armbruster, Carlota Raspberry, MD  oxybutynin (DITROPAN-XL) 5 MG 24 hr tablet Take 1 tablet (5 mg total) by mouth at bedtime. 03/02/20  Yes Martinique, Betty G, MD  traMADol (ULTRAM) 50 MG tablet Take 1 tablet (50 mg total) by mouth every 12 (twelve) hours as needed for up to 5 days. 03/02/20 03/07/20 Yes Martinique, Betty G, MD   Past Medical History:  Diagnosis Date  . Chest pain    a. 01/2015 Echo: Nl LV fxn, Gr 1 DD, triv AI, mild MR.  . Essential hypertension   . Family history of lung cancer   . Hepatic cyst    a. noted on CT 01/2015.  [name] Hyperthyroidism    GOING TO [facility] FOR SECOND OPINION  . Multinodular goiter    a.  01/2015 CT chest: multinodular goidter w/ substernal extension of the left lobe of the thyroid assoc w/ rightward deviation of tracheal air column.  . Neck pain, chronic   . Personal history of colonic polyps   . Pulmonary nodules    a. 01/2015 CT Chest: RLL ~ 25mm subpleural nodule - rec f/u in 6-12 mos.  [name] Splenic cyst    a. noted on CT 01/2015.   Past Surgical History:  Procedure Laterality Date  . CERVICAL DISC ARTHROPLASTY N/A 07/05/2019   Procedure: Cervical Six-Seven Artificial disc replacement;  Surgeon: Kristeen Miss, MD;  Location: [place];  Service: Neurosurgery;  Laterality: N/A;  Cervical Six-Seven Artificial disc replacement  . CESAREAN SECTION    . COLONOSCOPY  01/13/2020  . TONSILLECTOMY     AROUND 5-6 YRS OLD  . UPPER GASTROINTESTINAL ENDOSCOPY  01/13/2020   Family History  Problem Relation Age of Onset  . Heart attack Maternal Grandmother        deceased  . High blood pressure Mother   . High Cholesterol Mother   . Thyroid disease Mother   . Thyroid disease Sister   . Lung cancer Father   . Cancer Maternal Uncle        unk type  . Cancer Sister        unk type  . Colon cancer Neg Hx   . Colon polyps Neg Hx   .  Esophageal cancer Neg Hx   . Rectal cancer Neg Hx   . Stomach cancer Neg Hx    Social History   Socioeconomic History  . Marital status: Single    Spouse name: Not on file  . Number of children: Not on file  . Years of education: Not on file  . Highest education level: Not on file  Occupational History  . Occupation: retired  Tobacco Use  . Smoking status: Light Tobacco Smoker  . Smokeless tobacco: Never Used  . Tobacco comment: 1-2 NOT EVERY DAY   Substance and Sexual Activity  . Alcohol use: Yes    Alcohol/week: 0.0 standard drinks    Comment: rare  . Drug use: No  . Sexual activity: Not on file  Other Topics Concern  . Not on file  Social History Narrative  . Not on file   Social Determinants of Health   Financial Resource Strain:    . Difficulty of Paying Living Expenses:   Food Insecurity:   . Worried About Charity fundraiser in the Last Year:   . Arboriculturist in the Last Year:   Transportation Needs:   . Film/video editor (Medical):   [name] Lack of Transportation (Non-Medical):   Physical Activity:   . Days of Exercise per Week:   . Minutes of Exercise per Session:   Stress:   . Feeling of Stress :   Social Connections:   . Frequency of Communication with Friends and Family:   . Frequency of Social Gatherings with Friends and Family:   . Attends Religious Services:   . Active Member of Clubs or Organizations:   . Attends Archivist Meetings:   [name] Marital Status:   Intimate Partner Violence:   . Fear of Current or Ex-Partner:   . Emotionally Abused:   [name] Physically Abused:   . Sexually Abused:    ROS-see HPI   + = positive Constitutional:    weight loss, night sweats, fevers, chills, +fatigue, lassitude. HEENT:    +headaches, difficulty swallowing, +tooth/dental problems, sore throat,       sneezing, itching, ear ache, nasal congestion, post nasal drip, snoring CV:    chest pain, orthopnea, PND, swelling in lower extremities, anasarca,                                  dizziness, +palpitations Resp:   +shortness of breath with exertion or at rest.                productive cough,   non-productive cough, coughing up of blood.              change in color of mucus.  wheezing.   Skin:    rash or lesions. GI:  No-   heartburn, indigestion, abdominal pain, nausea, vomiting, diarrhea,                 change in bowel habits, loss of appetite GU: dysuria, change in color of urine, no urgency or frequency.   flank pain. MS:   +joint pain, stiffness, decreased range of motion, back pain. Neuro-     nothing unusual Psych:  change in mood or affect.  +depression or +anxiety.   memory loss.  OBJ- Physical Exam General- Alert, Oriented, Affect-appropriate, Distress- none acute, + yawning Skin-  rash-none, lesions- none, excoriation- none Lymphadenopathy- none Head- atraumatic  Eyes- Gross vision intact, PERRLA, conjunctivae and secretions clear            Ears- Hearing, canals-normal            Nose- Clear, no-Septal dev, mucus, polyps, erosion, perforation             Throat- Mallampati II , mucosa clear , drainage- none, tonsils- atrophic, + missing teeth Neck- flexible , trachea midline, no stridor , thyroid nl, carotid no bruit Chest - symmetrical excursion , unlabored           Heart/CV- RRR , +2 AS murmur , no gallop  , no rub, nl s1 s2                           - JVD- none , edema- none, stasis changes- none, varices- none           Lung- clear to P&A, wheeze- none, cough- none , dullness-none, rub- none           Chest wall-  Abd-  Br/ Gen/ Rectal- Not done, not indicated Extrem- cyanosis- none, clubbing, none, atrophy- none, strength- nl Neuro- grossly intact to observation

## 2020-03-07 DIAGNOSIS — M1611 Unilateral primary osteoarthritis, right hip: Secondary | ICD-10-CM | POA: Diagnosis not present

## 2020-03-16 ENCOUNTER — Ambulatory Visit: Payer: Medicare Other | Admitting: Endocrinology

## 2020-04-05 DIAGNOSIS — R519 Headache, unspecified: Secondary | ICD-10-CM | POA: Diagnosis not present

## 2020-04-05 DIAGNOSIS — G500 Trigeminal neuralgia: Secondary | ICD-10-CM | POA: Diagnosis not present

## 2020-04-11 DIAGNOSIS — M5020 Other cervical disc displacement, unspecified cervical region: Secondary | ICD-10-CM | POA: Diagnosis not present

## 2020-04-17 ENCOUNTER — Ambulatory Visit: Payer: Medicare Other | Admitting: Endocrinology

## 2020-04-17 DIAGNOSIS — Z0289 Encounter for other administrative examinations: Secondary | ICD-10-CM

## 2020-04-24 ENCOUNTER — Ambulatory Visit (HOSPITAL_COMMUNITY)
Admission: EM | Admit: 2020-04-24 | Discharge: 2020-04-24 | Disposition: A | Discharge status: Home / Self Care | Payer: Medicare Other | Attending: Family Medicine | Admitting: Family Medicine

## 2020-04-24 ENCOUNTER — Other Ambulatory Visit: Payer: Self-pay

## 2020-04-24 ENCOUNTER — Encounter (HOSPITAL_COMMUNITY): Payer: Self-pay

## 2020-04-24 DIAGNOSIS — B354 Tinea corporis: Secondary | ICD-10-CM

## 2020-04-24 MED ORDER — CLOTRIMAZOLE-BETAMETHASONE 1-0.05 % EX CREA: TOPICAL_CREAM | CUTANEOUS | 0 refills | Status: AC

## 2020-04-24 MED ORDER — FLUCONAZOLE 150 MG PO TABS: 150.0000 mg | ORAL_TABLET | Freq: Every day | ORAL | 0 refills | Status: DC

## 2020-04-24 NOTE — ED Provider Notes (Signed)
[place]    CSN: [id number] Arrival date & time: 04/24/20  1914      History   Chief Complaint Chief Complaint  Patient presents with  . Rash    HPI Darlene Griffith is a 69 y.o. female.   HPI  Pruritic rash for 2 weeks It is spreading she has not used any medication on this  He has not taken anything for the itching No family members have a rash She has never had a before No new soap lotion powder or product  Past Medical History:  Diagnosis Date  . Chest pain    a. 01/2015 Echo: Nl LV fxn, Gr 1 DD, triv AI, mild MR.  . Essential hypertension   . Family history of lung cancer   . Hepatic cyst    a. noted on CT 01/2015.  [name] Hyperthyroidism    GOING TO [facility] FOR SECOND OPINION  . Multinodular goiter    a. 01/2015 CT chest: multinodular goidter w/ substernal extension of the left lobe of the thyroid assoc w/ rightward deviation of tracheal air column.  . Neck pain, chronic   . Personal history of colonic polyps   . Pulmonary nodules    a. 01/2015 CT Chest: RLL ~ 17mm subpleural nodule - rec f/u in 6-12 mos.  [name] Splenic cyst    a. noted on CT 01/2015.    Patient Active Problem List   Diagnosis Date Noted  . Snoring 03/02/2020  . Tobacco use 03/02/2020  . Genetic testing 02/10/2020  . Family history of lung cancer   . Personal history of colonic polyps   . Dysphagia 01/06/2020  . Cervical spondylosis with myelopathy and radiculopathy 07/05/2019  . Neck pain, chronic 05/21/2019  . Insomnia 07/27/2018  . Multinodular goiter 07/14/2018  . Chronic fatigue 06/15/2018  . Midsternal chest pain 02/18/2015  . Chest pain   . Cough   . Essential hypertension   . Shortness of breath   . Aortic stenosis   . Pain in the chest   . EKG abnormality   . Malaise and fatigue   . Lung nodule   . Splenic cyst   . Hepatic cyst   . Hyperlipidemia   . Hypertension 02/16/2015    Past Surgical History:  Procedure Laterality Date  . CERVICAL DISC ARTHROPLASTY N/A  07/05/2019   Procedure: Cervical Six-Seven Artificial disc replacement;  Surgeon: Kristeen Miss, MD;  Location: [place];  Service: Neurosurgery;  Laterality: N/A;  Cervical Six-Seven Artificial disc replacement  . CESAREAN SECTION    . COLONOSCOPY  01/13/2020  . TONSILLECTOMY     AROUND 5-6 YRS OLD  . UPPER GASTROINTESTINAL ENDOSCOPY  01/13/2020    OB History   No obstetric history on file.      Home Medications    Prior to Admission medications   Medication Sig Start Date End Date Taking? Authorizing Provider  amLODipine (NORVASC) 5 MG tablet Take 1.5 tablets (7.5 mg total) by mouth daily. 03/02/20  Yes Martinique, Betty G, MD  carbamazepine (TEGRETOL) 100 MG chewable tablet Chew by mouth 4 (four) times daily. 03/22/20  Yes [provider]  EPITOL 200 MG tablet Take 200 mg by mouth 3 (three) times daily. 01/18/20  Yes [provider]  atorvastatin (LIPITOR) 40 MG tablet Take 1 tablet (40 mg total) by mouth daily. 01/16/20   Martinique, Betty G, MD  clotrimazole-betamethasone Donalynn Furlong) cream Apply to affected area 2 times daily prn 04/24/20   Raylene Everts, MD  diazepam (VALIUM) 10 MG tablet SMARTSIG:1 Tablet(s) By Mouth 03/22/20   [provider]  fluconazole (DIFLUCAN) 150 MG tablet Take 1 tablet (150 mg total) by mouth daily. Repeat in 1 week if needed 04/24/20   Raylene Everts, MD  HYDROcodone-acetaminophen (NORCO/VICODIN) 5-325 MG tablet Take 1 tablet by mouth every 6 (six) hours as needed for moderate pain. 01/14/20 01/13/21  Viona Gilmore D, NP  methimazole (TAPAZOLE) 5 MG tablet Take 0.5 tablets (2.5 mg total) by mouth 3 (three) times daily. 01/26/20   Renato Shin, MD  omeprazole (PRILOSEC) 20 MG capsule Take 20 mg BID for 14 days, then take daily for 14 days then stop 01/18/20   Armbruster, Carlota Raspberry, MD  oxybutynin (DITROPAN-XL) 5 MG 24 hr tablet Take 1 tablet (5 mg total) by mouth at bedtime. 03/02/20   Martinique, Betty G, MD    Family History Family History    Problem Relation Age of Onset  . Heart attack Maternal Grandmother        deceased  . High blood pressure Mother   . High Cholesterol Mother   . Thyroid disease Mother   . Thyroid disease Sister   . Lung cancer Father   . Cancer Maternal Uncle        unk type  . Cancer Sister        unk type  . Colon cancer Neg Hx   . Colon polyps Neg Hx   . Esophageal cancer Neg Hx   . Rectal cancer Neg Hx   . Stomach cancer Neg Hx     Social History Social History   Tobacco Use  . Smoking status: Light Tobacco Smoker  . Smokeless tobacco: Never Used  . Tobacco comment: 1-2 NOT EVERY DAY   Vaping Use  . Vaping Use: Never used  Substance Use Topics  . Alcohol use: Yes    Alcohol/week: 0.0 standard drinks    Comment: rare  . Drug use: No     Allergies   Patient has no known allergies.   Review of Systems Review of Systems See HPI  Physical Exam Triage Vital Signs ED Triage Vitals  Enc Vitals Group     BP 04/24/20 1935 (!) 157/109     Pulse Rate 04/24/20 1935 94     Resp 04/24/20 1935 18     Temp 04/24/20 1935 99.3 F (37.4 C)     Temp Source 04/24/20 1935 Oral     SpO2 04/24/20 1935 98 %     Weight --      Height --      Head Circumference --      Peak Flow --      Pain Score 04/24/20 1932 10     Pain Loc --      Pain Edu? --      Excl. in [place]? --    No data found.  Updated Vital Signs BP (!) 157/109 (BP Location: Left Arm)   Pulse 94   Temp 99.3 F (37.4 C) (Oral)   Resp 18   SpO2 98%      Physical Exam Constitutional:      General: She is not in acute distress.    Appearance: She is well-developed and normal weight.  HENT:     Head: Normocephalic and atraumatic.     Nose:     Comments: Mask in place Eyes:     Conjunctiva/sclera: Conjunctivae normal.     Pupils: Pupils are equal, round, and reactive to  light.  Cardiovascular:     Rate and Rhythm: Normal rate.  Pulmonary:     Effort: Pulmonary effort is normal. No respiratory distress.   Musculoskeletal:        General: Normal range of motion.     Cervical back: Normal range of motion.  Skin:    General: Skin is warm and dry.     Comments: Both arms there are circular and oval rashes that have clear centers and scaling  Neurological:     General: No focal deficit present.     Mental Status: She is alert.  Psychiatric:        Mood and Affect: Mood normal.        Behavior: Behavior normal.   No adenopathy   UC Treatments / Results  Labs (all labs ordered are listed, but only abnormal results are displayed) Labs Reviewed - No data to display  EKG   Radiology No results found.  Procedures Procedures (including critical care time)  Medications Ordered in UC Medications - No data to display  Initial Impression / Assessment and Plan / UC Course  I have reviewed the triage vital signs and the nursing notes.  Pertinent labs & imaging results that were available during my care of the patient were reviewed by me and considered in my medical decision making (see chart for details).      Final Clinical Impressions(s) / UC Diagnoses   Final diagnoses:  Tinea corporis     Discharge Instructions     Your blood pressure is up, make sure you get this checked again Take the rash medicine as prescribed Use the cream twice a day See your doctor if not improving in a week   ED Prescriptions    Medication Sig Dispense Auth. Provider   clotrimazole-betamethasone (LOTRISONE) cream Apply to affected area 2 times daily prn 15 g Raylene Everts, MD   fluconazole (DIFLUCAN) 150 MG tablet Take 1 tablet (150 mg total) by mouth daily. Repeat in 1 week if needed 2 tablet Raylene Everts, MD     PDMP not reviewed this encounter.   Raylene Everts, MD 04/24/20 2031

## 2020-04-24 NOTE — ED Triage Notes (Signed)
Pt c/o itching/painful rash to right arm that began as red small areas that "looked like mosquito bites", now progressed to larger areas of redness, and to left arm and pt's back.  Areas of red linear and circular raised rash with light colored centers observed.

## 2020-04-24 NOTE — Discharge Instructions (Signed)
Your blood pressure is up, make sure you get this checked again Take the rash medicine as prescribed Use the cream twice a day See your doctor if not improving in a week

## 2020-05-04 ENCOUNTER — Encounter: Payer: Self-pay | Admitting: Internal Medicine

## 2020-06-08 ENCOUNTER — Other Ambulatory Visit: Payer: Self-pay

## 2020-06-08 ENCOUNTER — Encounter: Payer: Self-pay | Admitting: Internal Medicine

## 2020-06-08 ENCOUNTER — Ambulatory Visit (INDEPENDENT_AMBULATORY_CARE_PROVIDER_SITE_OTHER): Payer: Medicare Other

## 2020-06-08 ENCOUNTER — Ambulatory Visit (INDEPENDENT_AMBULATORY_CARE_PROVIDER_SITE_OTHER): Payer: Medicare Other | Admitting: Internal Medicine

## 2020-06-08 VITALS — BP 142/82 | HR 83 | Temp 97.5°F | Ht 60.0 in | Wt 151.2 lb

## 2020-06-08 DIAGNOSIS — Z720 Tobacco use: Secondary | ICD-10-CM

## 2020-06-08 DIAGNOSIS — R0600 Dyspnea, unspecified: Secondary | ICD-10-CM

## 2020-06-08 DIAGNOSIS — R0609 Other forms of dyspnea: Secondary | ICD-10-CM

## 2020-06-08 DIAGNOSIS — R0602 Shortness of breath: Secondary | ICD-10-CM | POA: Diagnosis not present

## 2020-06-08 DIAGNOSIS — R0683 Snoring: Secondary | ICD-10-CM

## 2020-06-08 MED ORDER — SPIRIVA RESPIMAT 2.5 MCG/ACT IN AERS: 2.0000 | INHALATION_SPRAY | Freq: Every day | RESPIRATORY_TRACT | 0 refills | Status: DC

## 2020-06-08 NOTE — Progress Notes (Signed)
03/02/20- 69 yo F Smoker for sleep evaluation courtesy of Dr Martinique. Medical problem list includes Aortic stenosis, HTN, Multinodular Goiter, Cervical Spondylosis, Chronic Fatigue, Hyperlipidemia,  -----OSA per Dr.Jordan 2 Phizer Covax Epworth score 15 Body weight today 150 lbs Family tells her of loud snoring. Frequent waking. Always tired in daytime. Coffee has little effect.  ENT surgery+ tonsils. Had C-spine surgery, anterior approach. Goiter displaces trachea. Irregular bedtime, up 6-7AM.   06/08/20- 69 yoF Smoker followed for OSA, complicated by Aortic stenosis, HTN, Multinodular Goiter, Cervical Spondylosis, Chronic Fatigue, Hyperlipidemia, Trigeminal Neuralgia, Osteoarthritis,  Staff was unable to reach patient to schedule sleep study. Had 2 Phizer Covax Has not had her home sleep test yet. Aware of snoring. Easy DOE, little cough. Still smokes, but agreees to try working with pharmacy team for smoking cessation.  ROS-see HPI   + = positive Constitutional:    weight loss, night sweats, fevers, chills, +fatigue, lassitude. HEENT:    +headaches, difficulty swallowing, +tooth/dental problems, sore throat,       sneezing, itching, ear ache, nasal congestion, post nasal drip, snoring CV:    chest pain, orthopnea, PND, swelling in lower extremities, anasarca,                                   dizziness, +palpitations Resp:   +shortness of breath with exertion or at rest.                productive cough,   non-productive cough, coughing up of blood.              change in color of mucus.  wheezing.   Skin:    rash or lesions. GI:  No-   heartburn, indigestion, abdominal pain, nausea, vomiting, diarrhea,                 change in bowel habits, loss of appetite GU: dysuria, change in color of urine, no urgency or frequency.   flank pain. MS:   +joint pain, stiffness, decreased range of motion, back pain. Neuro-     nothing unusual Psych:  change in mood or affect.  +depression or  +anxiety.   memory loss.  OBJ- Physical Exam General- Alert, Oriented, Affect-appropriate, Distress- none acute,  Skin- rash-none, lesions- none, excoriation- none Lymphadenopathy- none Head- atraumatic            Eyes- Gross vision intact, PERRLA, conjunctivae and secretions clear            Ears- Hearing, canals-normal            Nose- Clear, no-Septal dev, mucus, polyps, erosion, perforation             Throat- Mallampati II , mucosa clear , drainage- none, tonsils- atrophic, + missing teeth Neck- flexible , trachea midline, no stridor , thyroid nl, carotid no bruit Chest - symmetrical excursion , unlabored           Heart/CV- RRR , +2 AS murmur , no gallop  , no rub, nl s1 s2                           - JVD- none , edema- none, stasis changes- none, varices- none           Lung- clear to P&A, wheeze- none, cough- none , dullness-none, rub- none  Chest wall-  Abd-  Br/ Gen/ Rectal- Not done, not indicated Extrem- cyanosis- none, clubbing, none, atrophy- none, strength- nl Neuro- grossly intact to observation

## 2020-06-08 NOTE — Patient Instructions (Signed)
Schedule - home sleep test   Dx oSA  Schedule PFT   Dx Dyspnea on exertion  Order - CXR Dyspnea on exertion, Smoker  Sample x 2 Spiriva 2.5    Inhale 2 puffs once daily for breathing  Order- referral to Pharmacy Team smoking cessation

## 2020-06-25 NOTE — Assessment & Plan Note (Signed)
Se agrees to work with PCCs to get home sleep test done- discussed again.

## 2020-06-25 NOTE — Assessment & Plan Note (Signed)
Acive smoker Plan- smoking cessation, CXR, PFT, sample trial of Spiriva Respimat

## 2020-06-25 NOTE — Assessment & Plan Note (Signed)
She agrees to pharmacy smoking referral

## 2020-07-29 ENCOUNTER — Emergency Department (HOSPITAL_COMMUNITY)
Admission: EM | Admit: 2020-07-29 | Discharge: 2020-07-30 | Disposition: A | Discharge status: Home / Self Care | Payer: Medicare Other | Attending: Emergency Medicine | Admitting: Emergency Medicine

## 2020-07-29 DIAGNOSIS — Z5321 Procedure and treatment not carried out due to patient leaving prior to being seen by health care provider: Secondary | ICD-10-CM | POA: Insufficient documentation

## 2020-07-29 DIAGNOSIS — R079 Chest pain, unspecified: Secondary | ICD-10-CM | POA: Diagnosis not present

## 2020-07-29 DIAGNOSIS — R42 Dizziness and giddiness: Secondary | ICD-10-CM | POA: Diagnosis not present

## 2020-07-29 DIAGNOSIS — R200 Anesthesia of skin: Secondary | ICD-10-CM | POA: Insufficient documentation

## 2020-07-29 DIAGNOSIS — R109 Unspecified abdominal pain: Secondary | ICD-10-CM | POA: Insufficient documentation

## 2020-07-29 DIAGNOSIS — R0600 Dyspnea, unspecified: Secondary | ICD-10-CM | POA: Insufficient documentation

## 2020-07-29 NOTE — ED Triage Notes (Signed)
The pt is c/o some lt lateral chest and flank pain all day yesterday stomach irritation numbness in hewr lt arm with tingling she felt scared and she had difficulty breathing also  She reports dizziness also

## 2020-07-30 ENCOUNTER — Other Ambulatory Visit: Payer: Self-pay

## 2020-07-30 ENCOUNTER — Emergency Department (HOSPITAL_COMMUNITY): Payer: Medicare Other

## 2020-07-30 ENCOUNTER — Encounter: Payer: Self-pay | Admitting: Family Medicine

## 2020-07-30 ENCOUNTER — Encounter (HOSPITAL_COMMUNITY): Payer: Self-pay | Admitting: *Deleted

## 2020-07-30 DIAGNOSIS — R079 Chest pain, unspecified: Secondary | ICD-10-CM | POA: Diagnosis not present

## 2020-07-30 LAB — BASIC METABOLIC PANEL
Anion gap: 9 (ref 5–15)
BUN: 6 mg/dL — ABNORMAL LOW (ref 8–23)
CO2: 27 mmol/L (ref 22–32)
Calcium: 9.2 mg/dL (ref 8.9–10.3)
Chloride: 104 mmol/L (ref 98–111)
Creatinine, Ser: 0.56 mg/dL (ref 0.44–1.00)
GFR, Estimated: >60 mL/min (ref >60)
Glucose, Bld: 124 mg/dL — ABNORMAL HIGH (ref 70–99)
Potassium: 4.2 mmol/L (ref 3.5–5.1)
Sodium: 140 mmol/L (ref 135–145)

## 2020-07-30 LAB — CBC
HCT: 43.2 % (ref 36.0–46.0)
Hemoglobin: 13.7 g/dL (ref 12.0–15.0)
MCH: 33.7 pg (ref 26.0–34.0)
MCHC: 31.7 g/dL (ref 30.0–36.0)
MCV: 106.1 fL — ABNORMAL HIGH (ref 80.0–100.0)
Platelets: 312 10*3/uL (ref 150–400)
RBC: 4.07 MIL/uL (ref 3.87–5.11)
RDW: 11.6 % (ref 11.5–15.5)
WBC: 13.4 10*3/uL — ABNORMAL HIGH (ref 4.0–10.5)
nRBC: 0 % (ref 0.0–0.2)

## 2020-07-30 LAB — TROPONIN I (HIGH SENSITIVITY): Troponin I (High Sensitivity): 9 ng/L (ref <18)

## 2020-07-30 NOTE — ED Notes (Signed)
Pt not responding for vital check.

## 2020-07-31 NOTE — Telephone Encounter (Signed)
I spoke with pt's daughter. We went over the results from the ER. She will bring her mom in for the appt on Friday to do further testing. Pt's stomach is still bothering her.

## 2020-07-31 NOTE — Telephone Encounter (Signed)
The patients daughter was requesting the lab results and ER results.  The patient went to the ER Sunday and left without being seen after having tests done.  The results were sent to MyChart and the daughter had questions and wanted to know if she needs to take the patient back to gastrology.  Please contact Caren Griffins [phone]

## 2020-08-03 ENCOUNTER — Other Ambulatory Visit: Payer: Self-pay

## 2020-08-03 ENCOUNTER — Encounter: Payer: Self-pay | Admitting: Family Medicine

## 2020-08-03 ENCOUNTER — Ambulatory Visit (INDEPENDENT_AMBULATORY_CARE_PROVIDER_SITE_OTHER): Payer: Medicare Other | Admitting: Family Medicine

## 2020-08-03 VITALS — BP 128/80 | HR 71 | Resp 16 | Ht 61.0 in | Wt 151.0 lb

## 2020-08-03 DIAGNOSIS — M549 Dorsalgia, unspecified: Secondary | ICD-10-CM | POA: Diagnosis not present

## 2020-08-03 DIAGNOSIS — L989 Disorder of the skin and subcutaneous tissue, unspecified: Secondary | ICD-10-CM

## 2020-08-03 DIAGNOSIS — R918 Other nonspecific abnormal finding of lung field: Secondary | ICD-10-CM

## 2020-08-03 DIAGNOSIS — N3941 Urge incontinence: Secondary | ICD-10-CM

## 2020-08-03 DIAGNOSIS — R5383 Other fatigue: Secondary | ICD-10-CM | POA: Diagnosis not present

## 2020-08-03 DIAGNOSIS — R0602 Shortness of breath: Secondary | ICD-10-CM

## 2020-08-03 DIAGNOSIS — I10 Essential (primary) hypertension: Secondary | ICD-10-CM | POA: Diagnosis not present

## 2020-08-03 MED ORDER — MIRABEGRON ER 25 MG PO TB24: 25.0000 mg | ORAL_TABLET | Freq: Every day | ORAL | 2 refills | Status: DC

## 2020-08-03 NOTE — Patient Instructions (Addendum)
A few things to remember from today's visit:   Urge incontinence of urine - Plan: mirabegron ER (MYRBETRIQ) 25 MG TB24 tablet  Abnormal findings on diagnostic imaging of lung - Plan: CT Chest W Contrast  Essential hypertension  Fatigue, unspecified type  Skin lesion of face - Plan: Ambulatory referral to Dermatology  Pain you described today seems to be musculoskeletal.  Fatigue is a common symptom associated with multiple factors: psychologic,medications, systemic illness, sleep disorders,infections, and unknown causes. Some work-up can be done to evaluate for common causes as thyroid disease,anemia,diabetes, or abnormalities in calcium,potassium,or sodium. Regular physical activity as tolerated and a healthy diet is usually might help and usually recommended for chronic fatigue.   Seborrheic Keratosis A seborrheic keratosis is a common, noncancerous (benign) skin growth. These growths are velvety, waxy, rough, tan, brown, or black spots that appear on the skin. These skin growths can be flat or raised, and scaly. What are the causes? The cause of this condition is not known. What increases the risk? You are more likely to develop this condition if you:  Have a family history of seborrheic keratosis.  Are 50 or older.  Are pregnant.  Have had estrogen replacement therapy. What are the signs or symptoms? Symptoms of this condition include growths on the face, chest, shoulders, back, or other areas. These growths:  Are usually painless, but may become irritated and itchy.  Can be yellow, brown, black, or other colors.  Are slightly raised or have a flat surface.  Are sometimes rough or wart-like in texture.  Are often velvety or waxy on the surface.  Are round or oval-shaped.  Often occur in groups, but may occur as a single growth. How is this diagnosed? This condition is diagnosed with a medical history and physical exam.  A sample of the growth may be tested  (skin biopsy).  You may need to see a skin specialist (dermatologist). How is this treated? Treatment is not usually needed for this condition, unless the growths are irritated or bleed often.  You may also choose to have the growths removed if you do not like their appearance. ? Most commonly, these growths are treated with a procedure in which liquid nitrogen is applied to "freeze" off the growth (cryosurgery). ? They may also be burned off with electricity (electrocautery) or removed by scraping (curettage). Follow these instructions at home:  Watch your growth for any changes.  Keep all follow-up visits as told by your health care provider. This is important.  Do not scratch or pick at the growth or growths. This can cause them to become irritated or infected. Contact a health care provider if:  You suddenly have many new growths.  Your growth bleeds, itches, or hurts.  Your growth suddenly becomes larger or changes color. Summary  A seborrheic keratosis is a common, noncancerous (benign) skin growth.  Treatment is not usually needed for this condition, unless the growths are irritated or bleed often.  Watch your growth for any changes.  Contact a health care provider if you suddenly have many new growths or your growth suddenly becomes larger or changes color.  Keep all follow-up visits as told by your health care provider. This is important. This information is not intended to replace advice given to you by your health care provider. Make sure you discuss any questions you have with your health care provider. Document Revised: 02/18/2018 Document Reviewed: 02/18/2018 Elsevier Patient Education  [employer].  If you need refills please  call your pharmacy. Do not use My Chart to request refills or for acute issues that need immediate attention.    Please be sure medication list is accurate. If a new problem present, please set up appointment sooner than planned  today.

## 2020-08-03 NOTE — Progress Notes (Signed)
Chief Complaint  Patient presents with   pain in abdomen   HPI: Darlene Griffith is a 69 y.o. female with hx of fatigue, chronic pain,multinodular thyroid,tobacco use,and HTN here today with above complaint. She has other concerns today.  Saturday and Sunday felt like he stomach was burning and "in fire."  Generalized abdominal pain , mild nausea but no vomiting. Pain was severe, no radiated. She has not noted melena or blood in stool.  She thinks problem may have been caused by spaghetti with sauce she ate for several days before pain started.  Negative for associated fever,chills,CP, or heartburn. More frequent stools until Sunday,loose stools. Negative for sick contact or recent travel. Denies fever,chills, sore throat,or skin rash.  Left-side back throbbing pain,points to posterior rib cage. + Tingling in left arm, which has resolved.  She was in the ER and left after 4 hours. She had labs and imaging sone after triage, she would like to go through results.  CXR on 07/30/20: 1. No acute cardiopulmonary process. 2. Subtle density projecting over the left upper lobe. A 4-6 week follow-up two-view chest x-ray is recommended. 3. Shift of the trachea to the right secondary to the previously demonstrated large retrosternal goiter. She called a friend and asked for something for pain, she got codeine, which helped some.  Left posterior rib cage pain, intermittently for "long time." No identified exacerbating or alleviating factors. Sometimes deep breathing seems to aggravate pain. No tingling, numbness, or burning on affected area.  Heavy sensation constantly, she never feels comfortable. It is like something "is there."  Pain is sometimes radiated to anterior rib cage. She feels "a lot better" today.  Lab Results  Component Value Date   WBC 13.4 (H) 07/30/2020   HGB 13.7 07/30/2020   HCT 43.2 07/30/2020   MCV 106.1 (H) 07/30/2020   PLT 312 07/30/2020   She  has SOB with exertion, it has been going on for a while and stable. She has seen pulmonologist, Dr Annamaria Boots. No associated CP,palpitations, worsening cough,wheezing,or diaphoresis.  BP was elevated at the ER, 173/113. She is not checking BP at home. She is on Amlodipine 5 mg daily.  Lab Results  Component Value Date   CREATININE 0.56 07/30/2020   BUN 6 (L) 07/30/2020   NA 140 07/30/2020   K 4.2 07/30/2020   CL 104 07/30/2020   CO2 27 07/30/2020   Fatigue: Chronic problem. She does not sleep well, wakes up frequently through the night. Denies Hx of OSA. + Loud snoring. Sleeps about 5 hours or less.  Urinary frequency and urgency, incontinence. Oxybutynin did not help. Denies dysuria, gross hematuria,or decreased urine output. She needs a new FMLA for work, so she is allowed to use the restroom.  She is also requesting dermatology referral. Hyperpigmented lesions on face and scalp. She has had lesions for a while, getting bigger. Negative for pruritus,easy bleeding,or tenderness.  Review of Systems  Constitutional: Negative for activity change and appetite change.  HENT: Negative for mouth sores and nosebleeds.   Eyes: Negative for redness and visual disturbance.  Cardiovascular: Negative for leg swelling.  Musculoskeletal: Positive for back pain. Negative for gait problem.  Neurological: Negative for syncope, weakness and headaches.  Psychiatric/Behavioral: Positive for sleep disturbance. Negative for confusion. The patient is nervous/anxious.   Rest see pertinent positives and negatives per HPI.  Current Outpatient Medications on File Prior to Visit  Medication Sig Dispense Refill   amLODipine (NORVASC) 5 MG tablet Take  1.5 tablets (7.5 mg total) by mouth daily. 100 tablet 1   atorvastatin (LIPITOR) 40 MG tablet Take 1 tablet (40 mg total) by mouth daily. 90 tablet 0   carbamazepine (TEGRETOL) 100 MG chewable tablet Chew by mouth 4 (four) times daily.      clotrimazole-betamethasone (LOTRISONE) cream Apply to affected area 2 times daily prn 15 g 0   diazepam (VALIUM) 10 MG tablet SMARTSIG:1 Tablet(s) By Mouth     EPITOL 200 MG tablet Take 200 mg by mouth 3 (three) times daily.     fluconazole (DIFLUCAN) 150 MG tablet Take 1 tablet (150 mg total) by mouth daily. Repeat in 1 week if needed 2 tablet 0   HYDROcodone-acetaminophen (NORCO/VICODIN) 5-325 MG tablet Take 1 tablet by mouth every 6 (six) hours as needed for moderate pain. 20 tablet 0   methimazole (TAPAZOLE) 5 MG tablet Take 0.5 tablets (2.5 mg total) by mouth 3 (three) times daily. 45 tablet 1   omeprazole (PRILOSEC) 20 MG capsule Take 20 mg BID for 14 days, then take daily for 14 days then stop 42 capsule 0   Tiotropium Bromide Monohydrate (SPIRIVA RESPIMAT) 2.5 MCG/ACT AERS Inhale 2 puffs into the lungs daily. 4 g 0   No current facility-administered medications on file prior to visit.   Past Medical History:  Diagnosis Date   Chest pain    a. 01/2015 Echo: Nl LV fxn, Gr 1 DD, triv AI, mild MR.   Essential hypertension    Family history of lung cancer    Hepatic cyst    a. noted on CT 01/2015.   Hyperthyroidism    GOING TO [facility] FOR SECOND OPINION   Multinodular goiter    a. 01/2015 CT chest: multinodular goidter w/ substernal extension of the left lobe of the thyroid assoc w/ rightward deviation of tracheal air column.   Neck pain, chronic    Personal history of colonic polyps    Pulmonary nodules    a. 01/2015 CT Chest: RLL ~ 44mm subpleural nodule - rec f/u in 6-12 mos.   Splenic cyst    a. noted on CT 01/2015.   No Known Allergies  Social History   Socioeconomic History   Marital status: Single    Spouse name: Not on file   Number of children: Not on file   Years of education: Not on file   Highest education level: Not on file  Occupational History   Occupation: retired  Tobacco Use   Smoking status: Light Tobacco Smoker    Types: Cigarettes    Smokeless tobacco: Never Used   Tobacco comment: 1-2 NOT EVERY DAY   Vaping Use   Vaping Use: Never used  Substance and Sexual Activity   Alcohol use: Yes    Alcohol/week: 0.0 standard drinks    Comment: rare   Drug use: No   Sexual activity: Not on file  Other Topics Concern   Not on file  Social History Narrative   Not on file   Social Determinants of Health   Financial Resource Strain:    Difficulty of Paying Living Expenses: Not on file  Food Insecurity:    Worried About [place] in the Last Year: Not on file   Ran Out of Food in the Last Year: Not on file  Transportation Needs:    Lack of Transportation (Medical): Not on file   Lack of Transportation (Non-Medical): Not on file  Physical Activity:    Days of Exercise per Week:  Not on file   Minutes of Exercise per Session: Not on file  Stress:    Feeling of Stress : Not on file  Social Connections:    Frequency of Communication with Friends and Family: Not on file   Frequency of Social Gatherings with Friends and Family: Not on file   Attends Religious Services: Not on file   Active Member of Clubs or Organizations: Not on file   Attends Archivist Meetings: Not on file   Marital Status: Not on file    Vitals:   08/03/20 1022  BP: 128/80  Pulse: 71  Resp: 16  SpO2: 96%   Body mass index is 28.53 kg/m.  Physical Exam Vitals and nursing note reviewed.  Constitutional:      General: She is not in acute distress.    Appearance: She is well-developed.  HENT:     Head: Normocephalic and atraumatic.      Mouth/Throat:     Mouth: Mucous membranes are moist.     Pharynx: Oropharynx is clear.  Eyes:     Conjunctiva/sclera: Conjunctivae normal.     Pupils: Pupils are equal, round, and reactive to light.  Cardiovascular:     Rate and Rhythm: Normal rate and regular rhythm.     Pulses:          Dorsalis pedis pulses are 2+ on the right side and 2+ on the left side.      Heart sounds: Murmur (SEM I/VI RUSB) heard.   Pulmonary:     Effort: Pulmonary effort is normal. No respiratory distress.     Breath sounds: Normal breath sounds.  Abdominal:     Palpations: Abdomen is soft. There is no hepatomegaly or mass.     Tenderness: There is no abdominal tenderness.  Musculoskeletal:     Lumbar back: No tenderness or bony tenderness. Negative right straight leg raise test and negative left straight leg raise test.       Back:  Lymphadenopathy:     Cervical: No cervical adenopathy.  Skin:    General: Skin is warm.     Findings: Lesion present. No erythema or rash.     Comments: Thick hyperpigmented lesions on right temporal scalp 3 cm and above left cheek (about 6 mm) Defined borders. See HENT graphic.  Neurological:     Mental Status: She is alert and oriented to person, place, and time.     Cranial Nerves: No cranial nerve deficit.     Gait: Gait normal.  Psychiatric:        Mood and Affect: Mood is anxious. Mood is not depressed.     Comments: Well groomed, good eye contact.   ASSESSMENT AND PLAN:  Darlene Griffith was seen today for pain in abdomen.  Diagnoses and all orders for this visit: Orders Placed This Encounter  Procedures   CT Chest W Contrast   Ambulatory referral to Dermatology    Urge incontinence of urine Oxybutynin did not help. She agrees with trying Myebetriq, some side effects discussed. FMLA will be completed but will not be completed a 3rd time. If not better, urology evaluation will be recommended.  -     mirabegron ER (MYRBETRIQ) 25 MG TB24 tablet; Take 1 tablet (25 mg total) by mouth daily.  Fatigue, unspecified type We discussed possible etiologies: Systemic illness, immunologic,endocrinology,sleep disorder, psychiatric/psychologic, infectious,medications side effects, and idiopathic. Some of her chronic medical problems can be contributing factors. Healthy diet and regular physical activity may help.  Abnormal  findings on diagnostic imaging of lung We reviewed CXR done during recent ER visit. Given her hx of tobacco use, I think it is appropriate to have chest CT done.  Essential hypertension BP adequately controlled. Continue Amlodipine 5 mg daily. Low salt diet also recommended.  Skin lesion of face Seems seborrheic keratosis. Educated about Dx. She is concerned and would like derma evaluation,so referral placed.  Shortness of breath Chronic. ? COPD, deconditioning. Follows with pulmonologist.  Left-sided back pain, unspecified back location, unspecified chronicity ? With radicular pain. Pain is not elicited on examination today. Most likely musculoskeletal. For now we will hold on back imaging. Monitor for recurrence.  Spent 41 minutes with pt.  During this time history was obtained and documented, examination was performed, prior labs/imaging reviewed, and assessment/plan discussed.  No follow-ups on file.   Jerrion Tabbert G. Martinique, MD  [facility]. [place] office.   A few things to remember from today's visit:   Urge incontinence of urine - Plan: mirabegron ER (MYRBETRIQ) 25 MG TB24 tablet  Abnormal findings on diagnostic imaging of lung - Plan: CT Chest W Contrast  Essential hypertension  Fatigue, unspecified type  Skin lesion of face - Plan: Ambulatory referral to Dermatology  Pain you described today seems to be musculoskeletal.  Fatigue is a common symptom associated with multiple factors: psychologic,medications, systemic illness, sleep disorders,infections, and unknown causes. Some work-up can be done to evaluate for common causes as thyroid disease,anemia,diabetes, or abnormalities in calcium,potassium,or sodium. Regular physical activity as tolerated and a healthy diet is usually might help and usually recommended for chronic fatigue.   Seborrheic Keratosis A seborrheic keratosis is a common, noncancerous (benign) skin growth. These growths are  velvety, waxy, rough, tan, brown, or black spots that appear on the skin. These skin growths can be flat or raised, and scaly. What are the causes? The cause of this condition is not known. What increases the risk? You are more likely to develop this condition if you:  Have a family history of seborrheic keratosis.  Are 50 or older.  Are pregnant.  Have had estrogen replacement therapy. What are the signs or symptoms? Symptoms of this condition include growths on the face, chest, shoulders, back, or other areas. These growths:  Are usually painless, but may become irritated and itchy.  Can be yellow, brown, black, or other colors.  Are slightly raised or have a flat surface.  Are sometimes rough or wart-like in texture.  Are often velvety or waxy on the surface.  Are round or oval-shaped.  Often occur in groups, but may occur as a single growth. How is this diagnosed? This condition is diagnosed with a medical history and physical exam.  A sample of the growth may be tested (skin biopsy).  You may need to see a skin specialist (dermatologist). How is this treated? Treatment is not usually needed for this condition, unless the growths are irritated or bleed often.  You may also choose to have the growths removed if you do not like their appearance. ? Most commonly, these growths are treated with a procedure in which liquid nitrogen is applied to "freeze" off the growth (cryosurgery). ? They may also be burned off with electricity (electrocautery) or removed by scraping (curettage). Follow these instructions at home:  Watch your growth for any changes.  Keep all follow-up visits as told by your health care provider. This is important.  Do not scratch or pick at the growth or growths. This can cause them  to become irritated or infected. Contact a health care provider if:  You suddenly have many new growths.  Your growth bleeds, itches, or hurts.  Your growth  suddenly becomes larger or changes color. Summary  A seborrheic keratosis is a common, noncancerous (benign) skin growth.  Treatment is not usually needed for this condition, unless the growths are irritated or bleed often.  Watch your growth for any changes.  Contact a health care provider if you suddenly have many new growths or your growth suddenly becomes larger or changes color.  Keep all follow-up visits as told by your health care provider. This is important. This information is not intended to replace advice given to you by your health care provider. Make sure you discuss any questions you have with your health care provider. Document Revised: 02/18/2018 Document Reviewed: 02/18/2018 Elsevier Patient Education  [employer].  If you need refills please call your pharmacy. Do not use My Chart to request refills or for acute issues that need immediate attention.    Please be sure medication list is accurate. If a new problem present, please set up appointment sooner than planned today.

## 2020-08-05 ENCOUNTER — Encounter: Payer: Self-pay | Admitting: Family Medicine

## 2020-08-06 IMAGING — CT CT ANGIO NECK
1 of 11 series · 5 of 33 positions shown · IV contrast (APPLIED)
Comparison: CT head 08/16/2018.  MRI 08/16/2018

CLINICAL DATA: Headache normal neuro exam

EXAM:
CT ANGIOGRAPHY HEAD AND NECK
TECHNIQUE: Multidetector CT imaging of the head and neck was performed using
the standard protocol during bolus administration of intravenous
contrast. Multiplanar CT image reconstructions and MIPs were
obtained to evaluate the vascular anatomy. Carotid stenosis
measurements (when applicable) are obtained utilizing NASCET
criteria, using the distal internal carotid diameter as the
denominator.
CONTRAST:  50mL OMNIPAQUE IOHEXOL 350 MG/ML SOLN

[Series 11: ax thins · axial · 0.50mm/px · z∈[+943,+1165]mm · 5 of 339 slices shown]
[im 57/339  soft-tissue]
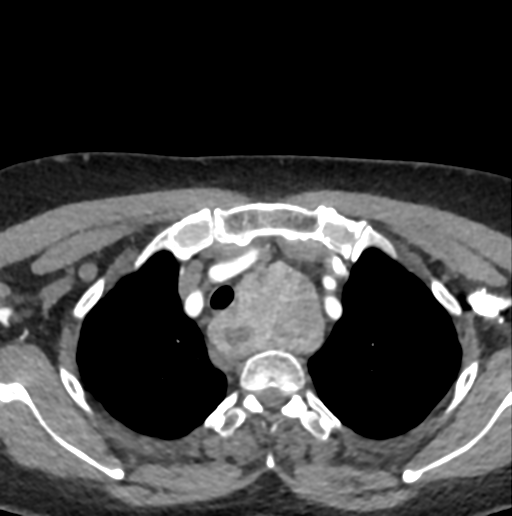
[im 113/339  bone]
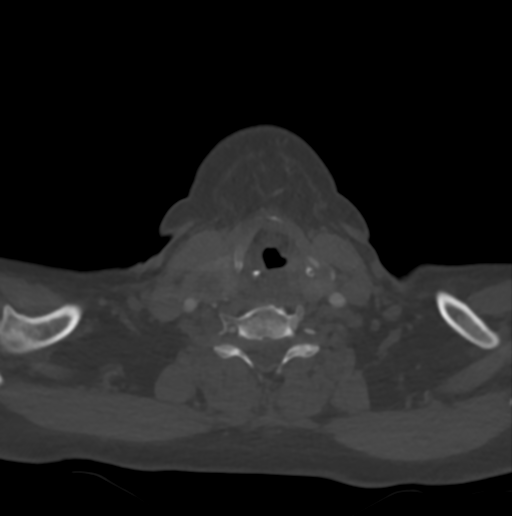
[im 170/339  soft-tissue]
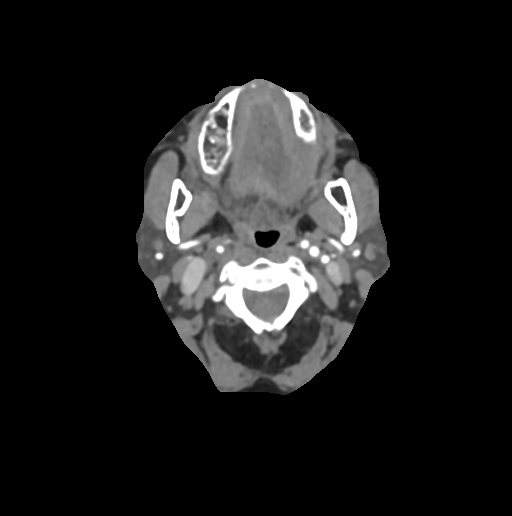
[im 226/339  bone]
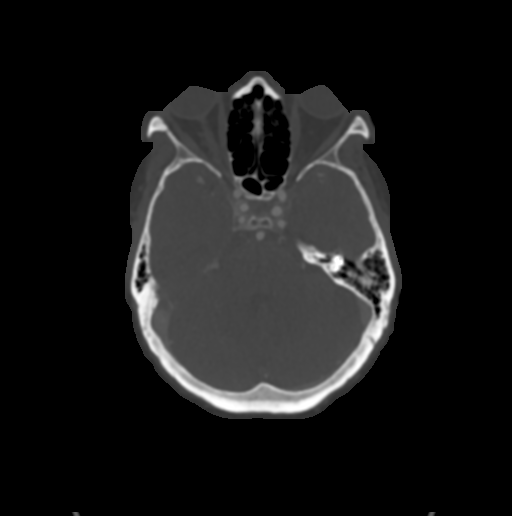
[im 282/339  soft-tissue]
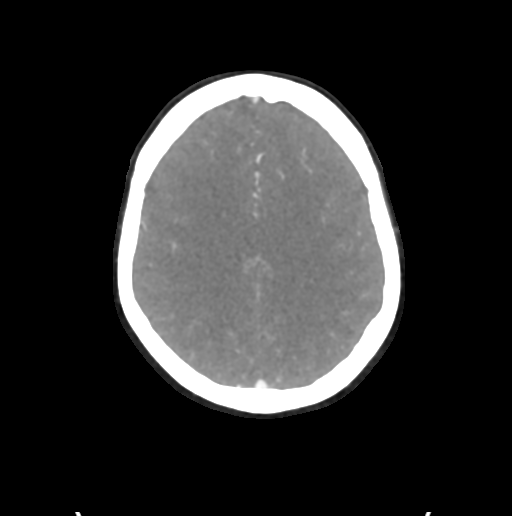

[5 of 33 positions shown; findings below may reference images not displayed]

FINDINGS: CT HEAD FINDINGS

Brain: New area of hypodensity in the right subinsular white matter
extending into the anterior limb internal capsule as well as the
external capsule and right frontal lobe. This is consistent with
infarct of indeterminate age. Chronic microvascular ischemic changes
in the white matter bilaterally were noted previously.

Negative for hemorrhage or mass.  Ventricle size normal.

Vascular: Negative for hyperdense vessel

Skull: Negative

Sinuses: Negative

Orbits: Negative

Review of the MIP images confirms the above findings

CTA NECK FINDINGS

Aortic arch: Standard branching. Imaged portion shows no evidence of
aneurysm or dissection. No significant stenosis of the major arch
vessel origins.

Right carotid system: Right carotid artery widely patent without
stenosis or irregularity. Tortuosity right internal carotid artery.

Left carotid system: Left carotid widely patent without stenosis or
irregularity. Tortuosity left internal carotid artery.

Vertebral arteries: Both vertebral arteries widely patent to the
basilar.

Skeleton: Prosthetic disc C6-7. No acute skeletal abnormality.
Normal cervical alignment.

Other neck: Thyroid goiter. Both lobes of the thyroid are enlarged
with multiple low-density areas in the thyroid. Substernal goiter
extending to the left of the trachea displacing the trachea to the
right. The goiter extends down to the level the pulmonary artery.
This is similar to the prior CT. Thyroid ultrasound recommended
(ref: [HOSPITAL]. [DATE]): 143-50).

Upper chest: Mediastinal mass compatible with substernal goiter.
Lung apices clear bilaterally.

Review of the MIP images confirms the above findings

CTA HEAD FINDINGS

Anterior circulation: Cavernous carotid widely patent bilaterally
without stenosis. Anterior and middle cerebral arteries widely
patent bilaterally

3 x 5 mm aneurysm of the left anterior cerebral artery in the A2
segment approximately 1 cm distal to the anterior communicating
artery.

Posterior circulation: Both vertebral arteries patent to the
basilar. PICA patent bilaterally. Basilar widely patent. Superior
cerebellar and posterior cerebral arteries patent without stenosis
or aneurysm.

Venous sinuses: Normal venous enhancement

Anatomic variants: None

Review of the MIP images confirms the above findings
IMPRESSION: 1. New area of low-density in the subinsular white matter on the
right as well as the right internal capsule anteriorly and right
frontal lobe. This is consistent with infarct of indeterminate age
but was not present on the prior study from 0789. Recommend MRI for
further evaluation. No intracranial hemorrhage
2. No significant carotid or vertebral artery stenosis in the neck
3. Negative for intracranial stenosis or large vessel occlusion
4. 3 x 5 mm aneurysm left anterior cerebral artery A2 segment. No
evidence of acute subarachnoid hemorrhage on CT.
5. Prominent goiter with extensive substernal goiter extending into
the mediastinum.
6. These results were called by telephone at the time of
interpretation on 01/14/2020 at [DATE] to provider SAMUEL PEDRO BERROCAL HUAMAN ,
who verbally acknowledged these results.

## 2020-08-31 ENCOUNTER — Other Ambulatory Visit: Payer: Self-pay

## 2020-08-31 ENCOUNTER — Ambulatory Visit
Admission: RE | Admit: 2020-08-31 | Discharge: 2020-08-31 | Disposition: A | Discharge status: Home / Self Care | Payer: Medicare Other | Source: Ambulatory Visit | Attending: Family Medicine | Admitting: Family Medicine

## 2020-08-31 DIAGNOSIS — R0602 Shortness of breath: Secondary | ICD-10-CM

## 2020-08-31 DIAGNOSIS — E042 Nontoxic multinodular goiter: Secondary | ICD-10-CM | POA: Diagnosis not present

## 2020-08-31 DIAGNOSIS — K7689 Other specified diseases of liver: Secondary | ICD-10-CM | POA: Diagnosis not present

## 2020-08-31 DIAGNOSIS — R918 Other nonspecific abnormal finding of lung field: Secondary | ICD-10-CM

## 2020-08-31 DIAGNOSIS — J984 Other disorders of lung: Secondary | ICD-10-CM | POA: Diagnosis not present

## 2020-08-31 DIAGNOSIS — J398 Other specified diseases of upper respiratory tract: Secondary | ICD-10-CM | POA: Diagnosis not present

## 2020-08-31 MED ORDER — IOPAMIDOL (ISOVUE-300) INJECTION 61%
75.0000 mL | Freq: Once | INTRAVENOUS | Status: AC | PRN
  Administered 2020-08-31: 75 mL via INTRAVENOUS

## 2020-09-10 ENCOUNTER — Other Ambulatory Visit: Payer: Self-pay

## 2020-09-10 DIAGNOSIS — J398 Other specified diseases of upper respiratory tract: Secondary | ICD-10-CM

## 2020-09-10 DIAGNOSIS — E041 Nontoxic single thyroid nodule: Secondary | ICD-10-CM

## 2020-10-26 ENCOUNTER — Ambulatory Visit (INDEPENDENT_AMBULATORY_CARE_PROVIDER_SITE_OTHER): Payer: Medicare Other

## 2020-10-26 DIAGNOSIS — Z0000 Encounter for general adult medical examination without abnormal findings: Secondary | ICD-10-CM

## 2020-10-26 NOTE — Patient Instructions (Signed)
Darlene Griffith , Thank you for taking time to come for your Medicare Wellness Visit. I appreciate your ongoing commitment to your health goals. Please review the following plan we discussed and let me know if I can assist you in the future.   Screening recommendations/referrals: Colonoscopy: Up to date, next due 01/12/2021 Mammogram: Currently due orders placed this visit  Bone Density: Currently due orders placed this visit  Recommended yearly ophthalmology/optometry visit for glaucoma screening and checkup Recommended yearly dental visit for hygiene and checkup  Vaccinations: Influenza vaccine: Patient declined  Pneumococcal vaccine: Currently due you may receive at your next in person office visit  Tdap vaccine: Currently due, you may receive in our office, at your local health department or at your pharmacy.  Shingles vaccine: Currently due for Shingrix, if you wish to receive we recommend that you do so at your local pharmacy as it is less expensive     Advanced directives: Advance directive discussed with you today. Even though you declined this today please call our office should you change your mind and we can give you the proper paperwork for you to fill out.   Conditions/risks identified: None   Next appointment: None    Preventive Care 70 Years and Older, Female Preventive care refers to lifestyle choices and visits with your health care provider that can promote health and wellness. What does preventive care include?  A yearly physical exam. This is also called an annual well check.  Dental exams once or twice a year.  Routine eye exams. Ask your health care provider how often you should have your eyes checked.  Personal lifestyle choices, including:  Daily care of your teeth and gums.  Regular physical activity.  Eating a healthy diet.  Avoiding tobacco and drug use.  Limiting alcohol use.  Practicing safe sex.  Taking low-dose aspirin every day.  Taking  vitamin and mineral supplements as recommended by your health care provider. What happens during an annual well check? The services and screenings done by your health care provider during your annual well check will depend on your age, overall health, lifestyle risk factors, and family history of disease. Counseling  Your health care provider may ask you questions about your:  Alcohol use.  Tobacco use.  Drug use.  Emotional well-being.  Home and relationship well-being.  Sexual activity.  Eating habits.  History of falls.  Memory and ability to understand (cognition).  Work and work Statistician.  Reproductive health. Screening  You may have the following tests or measurements:  Height, weight, and BMI.  Blood pressure.  Lipid and cholesterol levels. These may be checked every 5 years, or more frequently if you are over 70 years old.  Skin check.  Lung cancer screening. You may have this screening every year starting at age 70 if you have a 30-pack-year history of smoking and currently smoke or have quit within the past 15 years.  Fecal occult blood test (FOBT) of the stool. You may have this test every year starting at age 70.  Flexible sigmoidoscopy or colonoscopy. You may have a sigmoidoscopy every 5 years or a colonoscopy every 10 years starting at age 70.  Hepatitis C blood test.  Hepatitis B blood test.  Sexually transmitted disease (STD) testing.  Diabetes screening. This is done by checking your blood sugar (glucose) after you have not eaten for a while (fasting). You may have this done every 1-3 years.  Bone density scan. This is done to screen for  osteoporosis. You may have this done starting at age 70.  Mammogram. This may be done every 1-2 years. Talk to your health care provider about how often you should have regular mammograms. Talk with your health care provider about your test results, treatment options, and if necessary, the need for more  tests. Vaccines  Your health care provider may recommend certain vaccines, such as:  Influenza vaccine. This is recommended every year.  Tetanus, diphtheria, and acellular pertussis (Tdap, Td) vaccine. You may need a Td booster every 10 years.  Zoster vaccine. You may need this after age 70.  Pneumococcal 13-valent conjugate (PCV13) vaccine. One dose is recommended after age 70.  Pneumococcal polysaccharide (PPSV23) vaccine. One dose is recommended after age 70. Talk to your health care provider about which screenings and vaccines you need and how often you need them. This information is not intended to replace advice given to you by your health care provider. Make sure you discuss any questions you have with your health care provider. Document Released: 11/02/2015 Document Revised: 06/25/2016 Document Reviewed: 08/07/2015 Elsevier Interactive Patient Education  2017 [place] Prevention in the Home Falls can cause injuries. They can happen to people of all ages. There are many things you can do to make your home safe and to help prevent falls. What can I do on the outside of my home?  Regularly fix the edges of walkways and driveways and fix any cracks.  Remove anything that might make you trip as you walk through a door, such as a raised step or threshold.  Trim any bushes or trees on the path to your home.  Use bright outdoor lighting.  Clear any walking paths of anything that might make someone trip, such as rocks or tools.  Regularly check to see if handrails are loose or broken. Make sure that both sides of any steps have handrails.  Any raised decks and porches should have guardrails on the edges.  Have any leaves, snow, or ice cleared regularly.  Use sand or salt on walking paths during winter.  Clean up any spills in your garage right away. This includes oil or grease spills. What can I do in the bathroom?  Use night lights.  Install grab bars by the  toilet and in the tub and shower. Do not use towel bars as grab bars.  Use non-skid mats or decals in the tub or shower.  If you need to sit down in the shower, use a plastic, non-slip stool.  Keep the floor dry. Clean up any water that spills on the floor as soon as it happens.  Remove soap buildup in the tub or shower regularly.  Attach bath mats securely with double-sided non-slip rug tape.  Do not have throw rugs and other things on the floor that can make you trip. What can I do in the bedroom?  Use night lights.  Make sure that you have a light by your bed that is easy to reach.  Do not use any sheets or blankets that are too big for your bed. They should not hang down onto the floor.  Have a firm chair that has side arms. You can use this for support while you get dressed.  Do not have throw rugs and other things on the floor that can make you trip. What can I do in the kitchen?  Clean up any spills right away.  Avoid walking on wet floors.  Keep items that you use  a lot in easy-to-reach places.  If you need to reach something above you, use a strong step stool that has a grab bar.  Keep electrical cords out of the way.  Do not use floor polish or wax that makes floors slippery. If you must use wax, use non-skid floor wax.  Do not have throw rugs and other things on the floor that can make you trip. What can I do with my stairs?  Do not leave any items on the stairs.  Make sure that there are handrails on both sides of the stairs and use them. Fix handrails that are broken or loose. Make sure that handrails are as long as the stairways.  Check any carpeting to make sure that it is firmly attached to the stairs. Fix any carpet that is loose or worn.  Avoid having throw rugs at the top or bottom of the stairs. If you do have throw rugs, attach them to the floor with carpet tape.  Make sure that you have a light switch at the top of the stairs and the bottom of  the stairs. If you do not have them, ask someone to add them for you. What else can I do to help prevent falls?  Wear shoes that:  Do not have high heels.  Have rubber bottoms.  Are comfortable and fit you well.  Are closed at the toe. Do not wear sandals.  If you use a stepladder:  Make sure that it is fully opened. Do not climb a closed stepladder.  Make sure that both sides of the stepladder are locked into place.  Ask someone to hold it for you, if possible.  Clearly mark and make sure that you can see:  Any grab bars or handrails.  First and last steps.  Where the edge of each step is.  Use tools that help you move around (mobility aids) if they are needed. These include:  Canes.  Walkers.  Scooters.  Crutches.  Turn on the lights when you go into a dark area. Replace any light bulbs as soon as they burn out.  Set up your furniture so you have a clear path. Avoid moving your furniture around.  If any of your floors are uneven, fix them.  If there are any pets around you, be aware of where they are.  Review your medicines with your doctor. Some medicines can make you feel dizzy. This can increase your chance of falling. Ask your doctor what other things that you can do to help prevent falls. This information is not intended to replace advice given to you by your health care provider. Make sure you discuss any questions you have with your health care provider. Document Released: 08/02/2009 Document Revised: 03/13/2016 Document Reviewed: 11/10/2014 Elsevier Interactive Patient Education  2017 [employer].

## 2020-10-26 NOTE — Progress Notes (Signed)
Subjective:   Darlene Griffith is a 70 y.o. female who presents for an Initial Medicare Annual Wellness Visit.  Attempted to connect to virtual video multiple times. Unable to establish a connection. Call changed to telephone visit   I connected with Darlene Griffith today by telephone and verified that I am speaking with the correct person using two identifiers. Location Darlene Griffith: home Location provider: work Persons participating in the virtual visit: Darlene Griffith, provider.   I discussed the limitations, risks, security and privacy concerns of performing an evaluation and management service by telephone and the availability of in person appointments. I also discussed with the Darlene Griffith that there may be a Darlene Griffith responsible charge related to this service. The Darlene Griffith expressed understanding and verbally consented to this telephonic visit.    Interactive audio and video telecommunications were attempted between this provider and Darlene Griffith, however failed, due to Darlene Griffith having technical difficulties OR Darlene Griffith did not have access to video capability.  We continued and completed visit with audio only.     Review of Systems    N/A  Cardiac Risk Factors include: advanced age (>43men, >1 women);hypertension;dyslipidemia     Objective:    Today's Vitals   10/26/20 0841  PainSc: 7    There is no height or weight on file to calculate BMI.  Advanced Directives 10/26/2020 07/30/2020 01/14/2020 06/30/2019 05/21/2019 06/07/2018 11/07/2016  Does Darlene Griffith Have a Medical Advance Directive? No No No No No No No  Would Darlene Griffith like information on creating a medical advance directive? No - Darlene Griffith declined - Yes (ED - Information included in AVS) Yes (MAU/Ambulatory/Procedural Areas - Information given) No - Darlene Griffith declined - -    Current Medications (verified) Outpatient Encounter Medications as of 10/26/2020  Medication Sig  . amLODipine (NORVASC) 5 MG tablet Take 1.5 tablets (7.5 mg total) by mouth daily.   [name] atorvastatin (LIPITOR) 40 MG tablet Take 1 tablet (40 mg total) by mouth daily.  . carbamazepine (TEGRETOL) 100 MG chewable tablet Chew by mouth 4 (four) times daily.  . clotrimazole-betamethasone (LOTRISONE) cream Apply to affected area 2 times daily prn  . diazepam (VALIUM) 10 MG tablet SMARTSIG:1 Tablet(s) By Mouth  . EPITOL 200 MG tablet Take 200 mg by mouth 3 (three) times daily.  [name] HYDROcodone-acetaminophen (NORCO/VICODIN) 5-325 MG tablet Take 1 tablet by mouth every 6 (six) hours as needed for moderate pain.  . methimazole (TAPAZOLE) 5 MG tablet Take 0.5 tablets (2.5 mg total) by mouth 3 (three) times daily.  . mirabegron ER (MYRBETRIQ) 25 MG TB24 tablet Take 1 tablet (25 mg total) by mouth daily.  [name] omeprazole (PRILOSEC) 20 MG capsule Take 20 mg BID for 14 days, then take daily for 14 days then stop  . Tiotropium Bromide Monohydrate (SPIRIVA RESPIMAT) 2.5 MCG/ACT AERS Inhale 2 puffs into the lungs daily.  . fluconazole (DIFLUCAN) 150 MG tablet Take 1 tablet (150 mg total) by mouth daily. Repeat in 1 week if needed (Darlene Griffith not taking: Reported on 10/26/2020)   No facility-administered encounter medications on file as of 10/26/2020.    Allergies (verified) Darlene Griffith has no known allergies.   History: Past Medical History:  Diagnosis Date  . Chest pain    a. 01/2015 Echo: Nl LV fxn, Gr 1 DD, triv AI, mild MR.  . Essential hypertension   . Family history of lung cancer   . Hepatic cyst    a. noted on CT 01/2015.  [name] Hyperthyroidism    GOING TO [facility] FOR SECOND OPINION  .  Multinodular goiter    a. 01/2015 CT chest: multinodular goidter w/ substernal extension of the left lobe of the thyroid assoc w/ rightward deviation of tracheal air column.  . Neck pain, chronic   . Personal history of colonic polyps   . Pulmonary nodules    a. 01/2015 CT Chest: RLL ~ 58mm subpleural nodule - rec f/u in 6-12 mos.  [name] Splenic cyst    a. noted on CT 01/2015.   Past Surgical History:  Procedure  Laterality Date  . CERVICAL DISC ARTHROPLASTY N/A 07/05/2019   Procedure: Cervical Six-Seven Artificial disc replacement;  Surgeon: Kristeen Miss, MD;  Location: [facility];  Service: Neurosurgery;  Laterality: N/A;  Cervical Six-Seven Artificial disc replacement  . CESAREAN SECTION    . COLONOSCOPY  01/13/2020  . TONSILLECTOMY     AROUND 5-6 YRS OLD  . UPPER GASTROINTESTINAL ENDOSCOPY  01/13/2020   Family History  Problem Relation Age of Onset  . Heart attack Maternal Grandmother        deceased  . High blood pressure Mother   . High Cholesterol Mother   . Thyroid disease Mother   . Thyroid disease Sister   . Lung cancer Father   . Cancer Maternal Uncle        unk type  . Cancer Sister        unk type  . Colon cancer Neg Hx   . Colon polyps Neg Hx   . Esophageal cancer Neg Hx   . Rectal cancer Neg Hx   . Stomach cancer Neg Hx    Social History   Socioeconomic History  . Marital status: Single    Spouse name: Not on file  . Number of children: Not on file  . Years of education: Not on file  . Highest education level: Not on file  Occupational History  . Occupation: retired  Tobacco Use  . Smoking status: Light Tobacco Smoker    Types: Cigarettes  . Smokeless tobacco: Never Used  . Tobacco comment: 1-2 NOT EVERY DAY   Vaping Use  . Vaping Use: Never used  Substance and Sexual Activity  . Alcohol use: Yes    Alcohol/week: 0.0 standard drinks    Comment: rare  . Drug use: No  . Sexual activity: Not on file  Other Topics Concern  . Not on file  Social History Narrative  . Not on file   Social Determinants of Health   Financial Resource Strain: Low Risk   . Difficulty of Paying Living Expenses: Not hard at all  Food Insecurity: No Food Insecurity  . Worried About Charity fundraiser in the Last Year: Never true  . Ran Out of Food in the Last Year: Never true  Transportation Needs: No Transportation Needs  . Lack of Transportation (Medical): No  . Lack of  Transportation (Non-Medical): No  Physical Activity: Inactive  . Days of Exercise per Week: 0 days  . Minutes of Exercise per Session: 0 min  Stress: No Stress Concern Present  . Feeling of Stress : Not at all  Social Connections: Moderately Isolated  . Frequency of Communication with Friends and Family: More than three times a week  . Frequency of Social Gatherings with Friends and Family: More than three times a week  . Attends Religious Services: More than 4 times per year  . Active Member of Clubs or Organizations: No  . Attends Archivist Meetings: Never  . Marital Status: Never married  Tobacco Counseling Ready to quit: Not Answered Counseling given: Not Answered Comment: 1-2 NOT EVERY DAY    Clinical Intake:  Pre-visit preparation completed: Yes  Pain : 0-10 Pain Score: 7  Pain Type: Chronic pain Pain Location: Neck Pain Descriptors / Indicators: Aching Pain Onset: More than a month ago Pain Frequency: Constant Pain Relieving Factors: Laying down and resting  Pain Relieving Factors: Laying down and resting  Nutritional Risks: None Diabetes: No  How often do you need to have someone help you when you read instructions, pamphlets, or other written materials from your doctor or pharmacy?: 1 - Never What is the last grade level you completed in Griffith?: High Griffith  Diabetic?No   Interpreter Needed?: No  Information entered by :: [place] of Daily Living In your present state of health, do you have any difficulty performing the following activities: 10/26/2020  Hearing? Y  Comment speaks loud and has to turn up the tv really loud  Vision? N  Difficulty concentrating or making decisions? Y  Comment has some diffiulties with short term memory  Walking or climbing stairs? Y  Comment Darlene Griffith states gets SOB when walking stairs  Dressing or bathing? N  Doing errands, shopping? Y  Preparing Food and eating ? N  Using the Toilet? N   In the past six months, have you accidently leaked urine? Y  Do you have problems with loss of bowel control? N  Managing your Medications? N  Managing your Finances? N  Housekeeping or managing your Housekeeping? N  Some recent data might be hidden    Darlene Griffith Care Team: Martin[place], Betty G, MD as PCP - General (Family Medicine)  Indicate any recent Medical Services you may have received from other than [facility] providers in the past year (date may be approximate).     Assessment:   This is a routine wellness examination for Darlene Griffith.  Hearing/Vision screen  Hearing Screening   125Hz  250Hz  500Hz  1000Hz  2000Hz  3000Hz  4000Hz  6000Hz  8000Hz   Right ear:           Left ear:           Vision Screening Comments: Darlene Griffith states gets eyes examined once per year   Dietary issues and exercise activities discussed: Current Exercise Habits: The Darlene Griffith does not participate in regular exercise at present, Exercise limited by: orthopedic condition(s)  Goals    . Exercise 3x per week (30 min per time)    . Darlene Griffith Stated     I would like peace in my life!       Depression Screen PHQ 2/9 Scores 10/26/2020 12/04/2019 07/28/2018 11/13/2016  PHQ - 2 Score 0 0 0 3  PHQ- 9 Score 0 - - 9  Exception Documentation - - - Other- indicate reason in comment box  Not completed - - - ANNUAL SCREENING    Fall Risk Fall Risk  10/26/2020 05/20/2019 11/13/2016  Falls in the past year? 1 (No Data) Yes  Comment - Emmi Telephone Survey: data to providers prior to load -  Number falls in past yr: 1 (No Data) 1  Comment - Emmi Telephone Survey Actual Response =  -  Injury with Fall? 0 - No  Risk for fall due to : Impaired balance/gait - Other (Comment)  Follow up Falls evaluation completed;Falls prevention discussed - -    FALL RISK PREVENTION PERTAINING TO THE HOME:  Any stairs in or around the home? No  If so, are there any without handrails? No  Home free of loose throw rugs in walkways, pet beds, electrical  cords, etc? Yes  Adequate lighting in your home to reduce risk of falls? Yes   ASSISTIVE DEVICES UTILIZED TO PREVENT FALLS:  Life alert? No  Use of a cane, walker or w/c? Yes  Grab bars in the bathroom? No  Shower chair or bench in shower? No  Elevated toilet seat or a handicapped toilet? No    Cognitive Function:     Normal cognitive status assessed by direct observation by this Nurse Health Advisor. No abnormalities found.      Immunizations Immunization History  Administered Date(s) Administered  . Pneumococcal Conjugate-13 07/27/2018    TDAP status: Due, Education has been provided regarding the importance of this vaccine. Advised may receive this vaccine at local pharmacy or Health Dept. Aware to provide a copy of the vaccination record if obtained from local pharmacy or Health Dept. Verbalized acceptance and understanding.  Flu Vaccine status: Due, Education has been provided regarding the importance of this vaccine. Advised may receive this vaccine at local pharmacy or Health Dept. Aware to provide a copy of the vaccination record if obtained from local pharmacy or Health Dept. Verbalized acceptance and understanding. Pneumococcal vaccine status: Due, Education has been provided regarding the importance of this vaccine. Advised may receive this vaccine at local pharmacy or Health Dept. Aware to provide a copy of the vaccination record if obtained from local pharmacy or Health Dept. Verbalized acceptance and understanding.  Covid-19 vaccine status: Completed vaccines  Qualifies for Shingles Vaccine? Yes   Zostavax completed No   Shingrix Completed?: No.    Education has been provided regarding the importance of this vaccine. Darlene Griffith has been advised to call insurance company to determine out of pocket expense if they have not yet received this vaccine. Advised may also receive vaccine at local pharmacy or Health Dept. Verbalized acceptance and understanding.  Screening  Tests Health Maintenance  Topic Date Due  . COVID-19 Vaccine (1) Never done  . MAMMOGRAM  Never done  . DEXA SCAN  Never done  . PNA vac Low Risk Adult (2 of 2 - PPSV23) 07/28/2019  . INFLUENZA VACCINE  Never done  . COLONOSCOPY (Pts 45-29yrs Insurance coverage will need to be confirmed)  01/12/2021  . Hepatitis C Screening  Completed  . TETANUS/TDAP  Discontinued    Health Maintenance  Health Maintenance Due  Topic Date Due  . COVID-19 Vaccine (1) Never done  . MAMMOGRAM  Never done  . DEXA SCAN  Never done  . PNA vac Low Risk Adult (2 of 2 - PPSV23) 07/28/2019  . INFLUENZA VACCINE  Never done    Colorectal cancer screening: Type of screening: Colonoscopy. Completed 01/13/2020. Repeat every 1 years  Mammogram status: Ordered 10/27/2019. Pt provided with contact info and advised to call to schedule appt.   Bone Density status: Ordered 10/27/2019. Pt provided with contact info and advised to call to schedule appt.  Lung Cancer Screening: (Low Dose CT Chest recommended if Age 24-80 years, 30 pack-year currently smoking OR have quit w/in 15years.) does not qualify.   Lung Cancer Screening Referral: N/A   Additional Screening:  Hepatitis C Screening: does qualify; Completed 12/16/2019   Vision Screening: Recommended annual ophthalmology exams for early detection of glaucoma and other disorders of the eye. Is the Darlene Griffith up to date with their annual eye exam?  Yes  Who is the provider or what is the name of the office in which the Darlene Griffith  attends annual eye exams? Darlene Griffith unsure of eye doctors name. States office is off of [address]  If pt is not established with a provider, would they like to be referred to a provider to establish care? No .   Dental Screening: Recommended annual dental exams for proper oral hygiene  Community Resource Referral / Chronic Care Management: CRR required this visit?  No   CCM required this visit?  No      Plan:     I have personally  reviewed and noted the following in the Darlene Griffith's chart:   . Medical and social history . Use of alcohol, tobacco or illicit drugs  . Current medications and supplements . Functional ability and status . Nutritional status . Physical activity . Advanced directives . List of other physicians . Hospitalizations, surgeries, and ER visits in previous 12 months . Vitals . Screenings to include cognitive, depression, and falls . Referrals and appointments  In addition, I have reviewed and discussed with Darlene Griffith certain preventive protocols, quality metrics, and best practice recommendations. A written personalized care plan for preventive services as well as general preventive health recommendations were provided to Darlene Griffith.     Ofilia Neas, LPN   [id number]   Nurse Notes: None

## 2020-11-30 DIAGNOSIS — E049 Nontoxic goiter, unspecified: Secondary | ICD-10-CM | POA: Diagnosis not present

## 2020-11-30 DIAGNOSIS — E0520 Thyrotoxicosis with toxic multinodular goiter without thyrotoxic crisis or storm: Secondary | ICD-10-CM | POA: Diagnosis not present

## 2020-12-06 ENCOUNTER — Other Ambulatory Visit: Payer: Self-pay | Admitting: Surgery

## 2020-12-06 DIAGNOSIS — E049 Nontoxic goiter, unspecified: Secondary | ICD-10-CM

## 2020-12-06 DIAGNOSIS — E0520 Thyrotoxicosis with toxic multinodular goiter without thyrotoxic crisis or storm: Secondary | ICD-10-CM

## 2020-12-19 ENCOUNTER — Ambulatory Visit
Admission: RE | Admit: 2020-12-19 | Discharge: 2020-12-19 | Disposition: A | Discharge status: Home / Self Care | Payer: Medicare Other | Source: Ambulatory Visit | Attending: Surgery | Admitting: Surgery

## 2020-12-19 ENCOUNTER — Other Ambulatory Visit (HOSPITAL_COMMUNITY)
Admission: RE | Admit: 2020-12-19 | Discharge: 2020-12-19 | Disposition: A | Discharge status: Home / Self Care | Payer: BC Managed Care – PPO | Source: Ambulatory Visit | Attending: Radiology | Admitting: Radiology

## 2020-12-19 DIAGNOSIS — E0520 Thyrotoxicosis with toxic multinodular goiter without thyrotoxic crisis or storm: Secondary | ICD-10-CM

## 2020-12-19 DIAGNOSIS — E049 Nontoxic goiter, unspecified: Secondary | ICD-10-CM

## 2020-12-19 DIAGNOSIS — D34 Benign neoplasm of thyroid gland: Secondary | ICD-10-CM | POA: Diagnosis not present

## 2020-12-20 LAB — CYTOLOGY - NON PAP

## 2021-04-10 ENCOUNTER — Emergency Department (HOSPITAL_COMMUNITY): Payer: BC Managed Care – PPO

## 2021-04-10 ENCOUNTER — Encounter (HOSPITAL_COMMUNITY): Payer: Self-pay | Admitting: Emergency Medicine

## 2021-04-10 ENCOUNTER — Emergency Department (HOSPITAL_COMMUNITY)
Admission: EM | Admit: 2021-04-10 | Discharge: 2021-04-11 | Disposition: A | Discharge status: Home / Self Care | Payer: BC Managed Care – PPO | Attending: Emergency Medicine | Admitting: Emergency Medicine

## 2021-04-10 DIAGNOSIS — R471 Dysarthria and anarthria: Secondary | ICD-10-CM | POA: Insufficient documentation

## 2021-04-10 DIAGNOSIS — R200 Anesthesia of skin: Secondary | ICD-10-CM | POA: Diagnosis not present

## 2021-04-10 DIAGNOSIS — R519 Headache, unspecified: Secondary | ICD-10-CM | POA: Insufficient documentation

## 2021-04-10 DIAGNOSIS — R270 Ataxia, unspecified: Secondary | ICD-10-CM | POA: Diagnosis not present

## 2021-04-10 DIAGNOSIS — Z5321 Procedure and treatment not carried out due to patient leaving prior to being seen by health care provider: Secondary | ICD-10-CM | POA: Insufficient documentation

## 2021-04-10 DIAGNOSIS — I440 Atrioventricular block, first degree: Secondary | ICD-10-CM | POA: Diagnosis not present

## 2021-04-10 DIAGNOSIS — I10 Essential (primary) hypertension: Secondary | ICD-10-CM | POA: Diagnosis not present

## 2021-04-10 DIAGNOSIS — R202 Paresthesia of skin: Secondary | ICD-10-CM | POA: Diagnosis not present

## 2021-04-10 DIAGNOSIS — R29810 Facial weakness: Secondary | ICD-10-CM | POA: Diagnosis not present

## 2021-04-10 DIAGNOSIS — R531 Weakness: Secondary | ICD-10-CM | POA: Diagnosis not present

## 2021-04-10 DIAGNOSIS — R4781 Slurred speech: Secondary | ICD-10-CM | POA: Diagnosis not present

## 2021-04-10 LAB — CBC
HCT: 42.6 % (ref 36.0–46.0)
Hemoglobin: 13.6 g/dL (ref 12.0–15.0)
MCH: 33.8 pg (ref 26.0–34.0)
MCHC: 31.9 g/dL (ref 30.0–36.0)
MCV: 106 fL — ABNORMAL HIGH (ref 80.0–100.0)
Platelets: 300 10*3/uL (ref 150–400)
RBC: 4.02 MIL/uL (ref 3.87–5.11)
RDW: 12.3 % (ref 11.5–15.5)
WBC: 8.8 10*3/uL (ref 4.0–10.5)
nRBC: 0 % (ref 0.0–0.2)

## 2021-04-10 LAB — COMPREHENSIVE METABOLIC PANEL
ALT: 17 U/L (ref 0–44)
AST: 18 U/L (ref 15–41)
Albumin: 3.8 g/dL (ref 3.5–5.0)
Alkaline Phosphatase: 87 U/L (ref 38–126)
Anion gap: 7 (ref 5–15)
BUN: 9 mg/dL (ref 8–23)
CO2: 28 mmol/L (ref 22–32)
Calcium: 9.5 mg/dL (ref 8.9–10.3)
Chloride: 108 mmol/L (ref 98–111)
Creatinine, Ser: 0.63 mg/dL (ref 0.44–1.00)
GFR, Estimated: >60 mL/min (ref >60)
Glucose, Bld: 156 mg/dL — ABNORMAL HIGH (ref 70–99)
Potassium: 3.5 mmol/L (ref 3.5–5.1)
Sodium: 143 mmol/L (ref 135–145)
Total Bilirubin: 0.5 mg/dL (ref 0.3–1.2)
Total Protein: 7 g/dL (ref 6.5–8.1)

## 2021-04-10 LAB — PROTIME-INR
INR: 0.8 (ref 0.8–1.2)
Prothrombin Time: 11.5 seconds (ref 11.4–15.2)

## 2021-04-10 LAB — APTT: aPTT: 29 seconds (ref 24–36)

## 2021-04-10 MED ORDER — IOHEXOL 350 MG/ML SOLN
75.0000 mL | Freq: Once | INTRAVENOUS | Status: AC | PRN
  Administered 2021-04-10: 22:00:00 75 mL via INTRAVENOUS

## 2021-04-10 NOTE — ED Notes (Signed)
Pt left due to wait time. Pt IV removed

## 2021-04-10 NOTE — ED Provider Notes (Signed)
Emergency Medicine Provider Triage Evaluation Note  Darlene Griffith , a 70 y.o. female  was evaluated in triage.  Pt states that she was on the phone with her daughter 2:15 PM when she developed sudden onset right-sided facial pain and numbness with associated dysarthria, headache, and ataxia.  Her symptoms lasted approximately 15 minutes duration.  Her daughter called EMS and upon their arrival, she was back at her baseline and feeling improved.  She is since been able to ambulate here in the ED without difficulty.  No ongoing neurologic complaints.  She continues to endorse right-sided facial discomfort and mild right-sided headache.   Review of Systems  Positive: HA, temporary neuro deficits Negative: Extremity involvement, ongoing numbness/ataxia/dysarthria  Physical Exam  BP (!) 148/109 (BP Location: Left Arm)   Pulse 81   Temp 98.6 F (37 C) (Oral)   Resp 18   SpO2 93%  Gen:   Awake, no distress   Resp:  Normal effort  MSK:   Moves extremities without difficulty  Other:  Neuro exam: CN II through XII grossly intact.  No facial droop.  No asymmetries.  Good strength and sensation noted in all extremities throughout.  Ambulatory.  Medical Decision Making  Medically screening exam initiated at 5:58 PM.  Appropriate orders placed.  Ceci Taliaferro Furukawa was informed that the remainder of the evaluation will be completed by another provider, this initial triage assessment does not replace that evaluation, and the importance of remaining in the ED until their evaluation is complete.  Concern for TIA.  Will obtain work-up and defer to next provider once roomed. Stable.  Spoke with Dr. Eulis Foster, will proceed with CTA head and neck.    Corena Herter, PA-C 04/10/21 1906    Daleen Bo, MD 04/12/21 1040

## 2021-04-10 NOTE — ED Triage Notes (Signed)
Pt here from home with ems right facial numbness and slurred speech along with a h/a around 2 today , pt has has some old strokes in the past upon ems arrival symptoms were gone , pt does have htn and does not take her meds

## 2021-04-11 ENCOUNTER — Telehealth: Payer: Self-pay | Admitting: Family Medicine

## 2021-04-11 NOTE — Telephone Encounter (Signed)
amLODipine (NORVASC) 5 MG tablet  [facility] [place] [place] Phone:  [phone]  Fax:  [phone]      Patient is completely out of this medication.

## 2021-04-12 ENCOUNTER — Ambulatory Visit (INDEPENDENT_AMBULATORY_CARE_PROVIDER_SITE_OTHER): Payer: BC Managed Care – PPO | Admitting: Family Medicine

## 2021-04-12 ENCOUNTER — Encounter: Payer: Self-pay | Admitting: Family Medicine

## 2021-04-12 VITALS — BP 160/80 | HR 86 | Temp 99.0°F | Resp 16 | Ht 61.0 in | Wt 150.0 lb

## 2021-04-12 DIAGNOSIS — G459 Transient cerebral ischemic attack, unspecified: Secondary | ICD-10-CM

## 2021-04-12 DIAGNOSIS — R739 Hyperglycemia, unspecified: Secondary | ICD-10-CM

## 2021-04-12 DIAGNOSIS — R519 Headache, unspecified: Secondary | ICD-10-CM

## 2021-04-12 DIAGNOSIS — E785 Hyperlipidemia, unspecified: Secondary | ICD-10-CM | POA: Diagnosis not present

## 2021-04-12 DIAGNOSIS — E042 Nontoxic multinodular goiter: Secondary | ICD-10-CM

## 2021-04-12 DIAGNOSIS — Z8673 Personal history of transient ischemic attack (TIA), and cerebral infarction without residual deficits: Secondary | ICD-10-CM

## 2021-04-12 DIAGNOSIS — Z720 Tobacco use: Secondary | ICD-10-CM

## 2021-04-12 DIAGNOSIS — I10 Essential (primary) hypertension: Secondary | ICD-10-CM

## 2021-04-12 LAB — LIPID PANEL
Cholesterol: 283 mg/dL — ABNORMAL HIGH (ref 0–200)
HDL: 60.9 mg/dL (ref >39.00)
LDL Cholesterol: 183 mg/dL — ABNORMAL HIGH (ref 0–99)
NonHDL: 221.72
Total CHOL/HDL Ratio: 5
Triglycerides: 192 mg/dL — ABNORMAL HIGH (ref 0.0–149.0)
VLDL: 38.4 mg/dL (ref 0.0–40.0)

## 2021-04-12 LAB — C-REACTIVE PROTEIN: CRP: <1 mg/dL (ref 0.5–20.0)

## 2021-04-12 MED ORDER — AMLODIPINE BESYLATE 5 MG PO TABS: 5.0000 mg | ORAL_TABLET | Freq: Every day | ORAL | 1 refills | Status: DC

## 2021-04-12 MED ORDER — LOSARTAN POTASSIUM 25 MG PO TABS: 25.0000 mg | ORAL_TABLET | Freq: Every day | ORAL | 1 refills | Status: DC

## 2021-04-12 NOTE — Patient Instructions (Addendum)
A few things to remember from today's visit:   Essential hypertension - Plan: amLODipine (NORVASC) 5 MG tablet  Hyperglycemia  TIA (transient ischemic attack)  Hyperlipidemia LDL goal <100 - Plan: Lipid panel  Multinodular goiter - Plan: Ambulatory referral to Endocrinology  Temporal headache - Plan: C-reactive protein  If you need refills please call your pharmacy. Do not use My Chart to request refills or for acute issues that need immediate attention.   We need to start cholesterol medication. You must try to quit smoking.  Please be sure medication list is accurate. If a new problem present, please set up appointment sooner than planned today.  [facility] [address], [place], [place] [address]  [phone]  High Cholesterol  High cholesterol is a condition in which the blood has high levels of a white, waxy substance similar to fat (cholesterol). The liver makes all the cholesterol that the body needs. The human body needs small amounts of cholesterol to help build cells. A person gets extra orexcess cholesterol from the food that he or she eats. The blood carries cholesterol from the liver to the rest of the body. If you have high cholesterol, deposits (plaques) may build up on the walls of your arteries. Arteries are the blood vessels that carry blood away from your heart. These plaques make the arteries narrowand stiff. Cholesterol plaques increase your risk for heart attack and stroke. Work withyour health care provider to keep your cholesterol levels in a healthy range. What increases the risk? The following factors may make you more likely to develop this condition: Eating foods that are high in animal fat (saturated fat) or cholesterol. Being overweight. Not getting enough exercise. A family history of high cholesterol (familial hypercholesterolemia). Use of tobacco products. Having diabetes. What are the signs or symptoms? There  are no symptoms of this condition. How is this diagnosed? This condition may be diagnosed based on the results of a blood test. If you are older than 70 years of age, your health care provider may check your cholesterol levels every 4-6 years. You may be checked more often if you have high cholesterol or other risk factors for heart disease. The blood test for cholesterol measures: "Bad" cholesterol, or LDL cholesterol. This is the main type of cholesterol that causes heart disease. The desired level is less than 100 mg/dL. "Good" cholesterol, or HDL cholesterol. HDL helps protect against heart disease by cleaning the arteries and carrying the LDL to the liver for processing. The desired level for HDL is 60 mg/dL or higher. Triglycerides. These are fats that your body can store or burn for energy. The desired level is less than 150 mg/dL. Total cholesterol. This measures the total amount of cholesterol in your blood and includes LDL, HDL, and triglycerides. The desired level is less than 200 mg/dL. How is this treated? This condition may be treated with: Diet changes. You may be asked to eat foods that have more fiber and less saturated fats or added sugar. Lifestyle changes. These may include regular exercise, maintaining a healthy weight, and quitting use of tobacco products. Medicines. These are given when diet and lifestyle changes have not worked. You may be prescribed a statin medicine to help lower your cholesterol levels. Follow these instructions at home: Eating and drinking  Eat a healthy, balanced diet. This diet includes: Daily servings of a variety of fresh, frozen, or canned fruits and vegetables. Daily servings of whole grain foods that are rich in  fiber. Foods that are low in saturated fats and trans fats. These include poultry and fish without skin, lean cuts of meat, and low-fat dairy products. A variety of fish, especially oily fish that contain omega-3 fatty acids. Aim to  eat fish at least 2 times a week. Avoid foods and drinks that have added sugar. Use healthy cooking methods, such as roasting, grilling, broiling, baking, poaching, steaming, and stir-frying. Do not fry your food except for stir-frying.  Lifestyle  Get regular exercise. Aim to exercise for a total of 150 minutes a week. Increase your activity level by doing activities such as gardening, walking, and taking the stairs. Do not use any products that contain nicotine or tobacco, such as cigarettes, e-cigarettes, and chewing tobacco. If you need help quitting, ask your health care provider.  General instructions Take over-the-counter and prescription medicines only as told by your health care provider. Keep all follow-up visits as told by your health care provider. This is important. Where to find more information American Heart Association: www.heart.org National Heart, Lung, and Blood Institute: https://wilson-eaton.com/ Contact a health care provider if: You have trouble achieving or maintaining a healthy diet or weight. You are starting an exercise program. You are unable to stop smoking. Get help right away if: You have chest pain. You have trouble breathing. You have any symptoms of a stroke. "BE FAST" is an easy way to remember the main warning signs of a stroke: B - Balance. Signs are dizziness, sudden trouble walking, or loss of balance. E - Eyes. Signs are trouble seeing or a sudden change in vision. F - Face. Signs are sudden weakness or numbness of the face, or the face or eyelid drooping on one side. A - Arms. Signs are weakness or numbness in an arm. This happens suddenly and usually on one side of the body. S - Speech. Signs are sudden trouble speaking, slurred speech, or trouble understanding what people say. T - Time. Time to call emergency services. Write down what time symptoms started. You have other signs of a stroke, such as: A sudden, severe headache with no known  cause. Nausea or vomiting. Seizure. These symptoms may represent a serious problem that is an emergency. Do not wait to see if the symptoms will go away. Get medical help right away. Call your local emergency services (911 in the [place] Do not drive yourself to the hospital. Summary Cholesterol plaques increase your risk for heart attack and stroke. Work with your health care provider to keep your cholesterol levels in a healthy range. Eat a healthy, balanced diet, get regular exercise, and maintain a healthy weight. Do not use any products that contain nicotine or tobacco, such as cigarettes, e-cigarettes, and chewing tobacco. Get help right away if you have any symptoms of a stroke. This information is not intended to replace advice given to you by your health care provider. Make sure you discuss any questions you have with your healthcare provider. Document Revised: 09/05/2019 Document Reviewed: 09/05/2019 Elsevier Patient Education  2022 [employer].

## 2021-04-12 NOTE — Progress Notes (Signed)
HPI: [name] is a 70 y.o. female, who is here today to follow on recent ED visit.  Evaluated in the ED on 04/10/2021 because of right-sided facial pain, numbness, dysarthria, headache, and ataxia. Symptoms lasted about 15 minutes. She left the ED before she was seen by provider.  1. Negative CTA for large vessel occlusion. No hemodynamically significant stenosis or other acute vascular abnormality. 2. 4 x 6 x 4 mm left A2 segment aneurysm, similar to previous. 3. Enlarged multinodular goiter with associated substernal extension, similar to previous. Finding has been further assessed on thyroid ultrasounds.  She is still having right facial/temple soreness and does not feel like it is completely back to her baseline.  HTN: BP at home "very high" sometimes. She has not been consistent with taking amlodipine 7.5 mg because of lightheadedness. She has similar problem with lisinopril in the past.  Negative for visual changes, chest pain, dyspnea, palpitation, focal weakness, or edema.  Lab Results  Component Value Date   CREATININE 0.63 04/10/2021   BUN 9 04/10/2021   NA 143 04/10/2021   K 3.5 04/10/2021   CL 108 04/10/2021   CO2 28 04/10/2021   Lab Results  Component Value Date   WBC 8.8 04/10/2021   HGB 13.6 04/10/2021   HCT 42.6 04/10/2021   MCV 106.0 (H) 04/10/2021   PLT 300 04/10/2021   Lab Results  Component Value Date   ALT 17 04/10/2021   AST 18 04/10/2021   ALKPHOS 87 04/10/2021   BILITOT 0.5 04/10/2021    Echo 05/2015 LVEF normal, grade 1 diastolic dysfunction. Negative for orthopnea and PND.  Left A2 aneurysm measures 6 x 4 x 4 mm, similar to previous She has been evaluated by neurosurgeon, Dr. Ellene Griffith, 12/2019.  HLD: She is not on Atorvastatin 40 mg daily.  Lab Results  Component Value Date   CHOL 217 (H) 05/21/2019   HDL 46 05/21/2019   LDLCALC 133 (H) 05/21/2019   TRIG 190 (H) 05/21/2019   CHOLHDL 4.7 05/21/2019   Multinodular goiter:  States that she was supposed to get a referral to see provider and be started on medication. She underwent thyroid Bx in 12/2020 and according to pt, she was given the options between surgery and medical treatment.  Lab Results  Component Value Date   TSH 0.26 (L) 12/02/2019   1. Large heterogeneous lobular and multinodular thyroid gland most consistent with multinodular goiter. 2. Stable to incremental enlargement of TI-RADS category 3 nodule in the right inferior gland. This nodule continues to meet criteria to consider fine-needle aspiration biopsy. Patient will undergo biopsy of this nodule today. 3. Interval resolution of previously identified nodule in the left  mid gland which met criteria for biopsy. Given resolution, no biopsy will be performed of this nodule. 4. Additional sonographically low risk nodules and pseudo nodules bilaterally noted incidentally. No further follow-up required for these findings.  She is also inquiring about dermatologist appt. She was referred in 07/2020 because SK lesions on face. Apparently office could not contact her.  Review of Systems  Constitutional:  Negative for activity change, appetite change and fever.  HENT:  Negative for mouth sores, nosebleeds and trouble swallowing.   Respiratory:  Negative for cough and wheezing.   Gastrointestinal:  Negative for abdominal pain, nausea and vomiting.       Negative for changes in bowel habits.  Endocrine: Negative for cold intolerance and heat intolerance.  Genitourinary:  Negative for decreased urine volume,  dysuria and hematuria.  Musculoskeletal:  Negative for gait problem.  Skin:  Negative for pallor and rash.  Neurological:  Negative for syncope, facial asymmetry and weakness.  Psychiatric/Behavioral:  Negative for confusion. The patient is nervous/anxious.   Rest see pertinent positives and negatives per HPI.  Current Outpatient Medications on File Prior to Visit  Medication Sig Dispense Refill    atorvastatin (LIPITOR) 40 MG tablet Take 1 tablet (40 mg total) by mouth daily. (Patient not taking: Reported on 04/12/2021) 90 tablet 0   carbamazepine (TEGRETOL) 100 MG chewable tablet Chew by mouth 4 (four) times daily. (Patient not taking: Reported on 04/12/2021)     clotrimazole-betamethasone (LOTRISONE) cream Apply to affected area 2 times daily prn (Patient not taking: Reported on 04/12/2021) 15 g 0   diazepam (VALIUM) 10 MG tablet SMARTSIG:1 Tablet(s) By Mouth (Patient not taking: Reported on 04/12/2021)     EPITOL 200 MG tablet Take 200 mg by mouth 3 (three) times daily. (Patient not taking: Reported on 04/12/2021)     fluconazole (DIFLUCAN) 150 MG tablet Take 1 tablet (150 mg total) by mouth daily. Repeat in 1 week if needed (Patient not taking: Reported on 04/12/2021) 2 tablet 0   methimazole (TAPAZOLE) 5 MG tablet Take 0.5 tablets (2.5 mg total) by mouth 3 (three) times daily. (Patient not taking: Reported on 04/12/2021) 45 tablet 1   mirabegron ER (MYRBETRIQ) 25 MG TB24 tablet Take 1 tablet (25 mg total) by mouth daily. (Patient not taking: Reported on 04/12/2021) 30 tablet 2   omeprazole (PRILOSEC) 20 MG capsule Take 20 mg BID for 14 days, then take daily for 14 days then stop (Patient not taking: Reported on 04/12/2021) 42 capsule 0   Tiotropium Bromide Monohydrate (SPIRIVA RESPIMAT) 2.5 MCG/ACT AERS Inhale 2 puffs into the lungs daily. (Patient not taking: Reported on 04/12/2021) 4 g 0   No current facility-administered medications on file prior to visit.   Past Medical History:  Diagnosis Date   Chest pain    a. 01/2015 Echo: Nl LV fxn, Gr 1 DD, triv AI, mild MR.   Essential hypertension    Family history of lung cancer    Hepatic cyst    a. noted on CT 01/2015.   Hyperthyroidism    GOING TO [facility] FOR SECOND OPINION   Multinodular goiter    a. 01/2015 CT chest: multinodular goidter w/ substernal extension of the left lobe of the thyroid assoc w/ rightward deviation of tracheal air column.    Neck pain, chronic    Personal history of colonic polyps    Pulmonary nodules    a. 01/2015 CT Chest: RLL ~ 7m subpleural nodule - rec f/u in 6-12 mos.   Splenic cyst    a. noted on CT 01/2015.   No Known Allergies  Social History   Socioeconomic History   Marital status: Single    Spouse name: Not on file   Number of children: Not on file   Years of education: Not on file   Highest education level: Not on file  Occupational History   Occupation: retired  Tobacco Use   Smoking status: Light Smoker    Pack years: 0.00    Types: Cigarettes   Smokeless tobacco: Never   Tobacco comments:    1-2 NOT EVERY DAY   Vaping Use   Vaping Use: Never used  Substance and Sexual Activity   Alcohol use: Yes    Alcohol/week: 0.0 standard drinks    Comment: rare  Drug use: No   Sexual activity: Not on file  Other Topics Concern   Not on file  Social History Narrative   Not on file   Social Determinants of Health   Financial Resource Strain: Low Risk    Difficulty of Paying Living Expenses: Not hard at all  Food Insecurity: No Food Insecurity   Worried About Running Out of Food in the Last Year: Never true   Blairs in the Last Year: Never true  Transportation Needs: No Transportation Needs   Lack of Transportation (Medical): No   Lack of Transportation (Non-Medical): No  Physical Activity: Inactive   Days of Exercise per Week: 0 days   Minutes of Exercise per Session: 0 min  Stress: No Stress Concern Present   Feeling of Stress : Not at all  Social Connections: Moderately Isolated   Frequency of Communication with Friends and Family: More than three times a week   Frequency of Social Gatherings with Friends and Family: More than three times a week   Attends Religious Services: More than 4 times per year   Active Member of Clubs or Organizations: No   Attends Archivist Meetings: Never   Marital Status: Never married    Vitals:   04/12/21 1152  BP:  (!) 160/80  Pulse: 86  Resp: 16  Temp: 99 F (37.2 C)  SpO2: 95%   Body mass index is 28.34 kg/m.  Physical Exam Vitals and nursing note reviewed.  Constitutional:      General: She is not in acute distress.    Appearance: She is well-developed.  HENT:     Head: Normocephalic and atraumatic.      Mouth/Throat:     Mouth: Mucous membranes are moist.     Pharynx: Oropharynx is clear.  Eyes:     Conjunctiva/sclera: Conjunctivae normal.  Cardiovascular:     Rate and Rhythm: Normal rate and regular rhythm.     Pulses:          Dorsalis pedis pulses are 2+ on the right side and 2+ on the left side.     Heart sounds: Murmur (SEM I-II RUSB and LUSB) heard.  Pulmonary:     Effort: Pulmonary effort is normal. No respiratory distress.     Breath sounds: Normal breath sounds.  Abdominal:     Palpations: Abdomen is soft. There is no hepatomegaly or mass.     Tenderness: There is no abdominal tenderness.  Lymphadenopathy:     Cervical: No cervical adenopathy.  Skin:    General: Skin is warm.     Findings: No erythema or rash.  Neurological:     General: No focal deficit present.     Mental Status: She is alert and oriented to person, place, and time.     Cranial Nerves: No cranial nerve deficit.     Gait: Gait normal.  Psychiatric:     Comments: Well groomed, good eye contact.   ASSESSMENT AND PLAN:  [name] was seen today for follow-up.  Diagnoses and all orders for this visit: Orders Placed This Encounter  Procedures   C-reactive protein   Lipid panel   Ambulatory referral to Endocrinology   Lab Results  Component Value Date   CHOL 283 (H) 04/12/2021   HDL 60.90 04/12/2021   LDLCALC 183 (H) 04/12/2021   TRIG 192.0 (H) 04/12/2021   CHOLHDL 5 04/12/2021   Lab Results  Component Value Date   CRP <1.0 04/12/2021   TIA (  transient ischemic attack) No acute findings on CT, prior CVA. Neurologic examination today negative for focal deficit. Smoking cessation  encouraged, planning on starting nicotine lozenges. CV benefits of statins discussed. Aspirin 81 mg daily.  Essential hypertension BP is not adequately controlled. We discussed complications of elevated BP. She may tolerate better lower doses of Amlodipine, she will try 5 mg at bedtime. Losartan 25 mg in the morning added today. Monitor BP regularly. Low salt diet.  -     amLODipine (NORVASC) 5 MG tablet; Take 1 tablet (5 mg total) by mouth at bedtime.  Hyperglycemia A1C today 5.0. A healthy life style for diabetes prevention encouraged.  Hyperlipidemia LDL goal <100 Resume Atorvastatin 40 mg daily and low fat diet, handout given. Further recommendations according to lipid panel results.  Multinodular goiter Reviewing records, she was started on Methimazole 5 mg 1/2 tab tid in 01/2021, after thyroid nodule Bx. Stressed the importance of compliance with treatment. Endo referral placed,so she can continue following with Dr Loanne Drilling.  Temporal headache Improved. Possible etiologies discussed. CRP ordered today. Instructed about warning signs.  Tobacco use Stressed the importance of smoking cessation. States that she is seeing a Careers adviser" to help her with smoking cessation.  Spent 60 minutes.  During this time history was obtained and documented, examination was performed, prior labs/imaging/and records reviewed, and assessment/plan discussed. Dermatologist contact information given, so she can call and arrange appt.  Return in about 6 weeks (around 05/24/2021).   Frederika Hukill G. [place], MD  [facility]. [place] office.   A few things to remember from today's visit:   Essential hypertension - Plan: amLODipine (NORVASC) 5 MG tablet  Hyperglycemia  TIA (transient ischemic attack)  Hyperlipidemia LDL goal <100 - Plan: Lipid panel  Multinodular goiter - Plan: Ambulatory referral to Endocrinology  Temporal headache - Plan: C-reactive protein  If you need refills  please call your pharmacy. Do not use My Chart to request refills or for acute issues that need immediate attention.   We need to start cholesterol medication. You must try to quit smoking.  Please be sure medication list is accurate. If a new problem present, please set up appointment sooner than planned today.  [facility] [address] [place], [place] [address]  [phone]  High Cholesterol  High cholesterol is a condition in which the blood has high levels of a white, waxy substance similar to fat (cholesterol). The liver makes all the cholesterol that the body needs. The human body needs small amounts of cholesterol to help build cells. A person gets extra orexcess cholesterol from the food that he or she eats. The blood carries cholesterol from the liver to the rest of the body. If you have high cholesterol, deposits (plaques) may build up on the walls of your arteries. Arteries are the blood vessels that carry blood away from your heart. These plaques make the arteries narrowand stiff. Cholesterol plaques increase your risk for heart attack and stroke. Work withyour health care provider to keep your cholesterol levels in a healthy range. What increases the risk? The following factors may make you more likely to develop this condition: Eating foods that are high in animal fat (saturated fat) or cholesterol. Being overweight. Not getting enough exercise. A family history of high cholesterol (familial hypercholesterolemia). Use of tobacco products. Having diabetes. What are the signs or symptoms? There are no symptoms of this condition. How is this diagnosed? This condition may be diagnosed based on the results of  a blood test. If you are older than 70 years of age, your health care provider may check your cholesterol levels every 4-6 years. You may be checked more often if you have high cholesterol or other risk factors for heart disease. The blood  test for cholesterol measures: "Bad" cholesterol, or LDL cholesterol. This is the main type of cholesterol that causes heart disease. The desired level is less than 100 mg/dL. "Good" cholesterol, or HDL cholesterol. HDL helps protect against heart disease by cleaning the arteries and carrying the LDL to the liver for processing. The desired level for HDL is 60 mg/dL or higher. Triglycerides. These are fats that your body can store or burn for energy. The desired level is less than 150 mg/dL. Total cholesterol. This measures the total amount of cholesterol in your blood and includes LDL, HDL, and triglycerides. The desired level is less than 200 mg/dL. How is this treated? This condition may be treated with: Diet changes. You may be asked to eat foods that have more fiber and less saturated fats or added sugar. Lifestyle changes. These may include regular exercise, maintaining a healthy weight, and quitting use of tobacco products. Medicines. These are given when diet and lifestyle changes have not worked. You may be prescribed a statin medicine to help lower your cholesterol levels. Follow these instructions at home: Eating and drinking  Eat a healthy, balanced diet. This diet includes: Daily servings of a variety of fresh, frozen, or canned fruits and vegetables. Daily servings of whole grain foods that are rich in fiber. Foods that are low in saturated fats and trans fats. These include poultry and fish without skin, lean cuts of meat, and low-fat dairy products. A variety of fish, especially oily fish that contain omega-3 fatty acids. Aim to eat fish at least 2 times a week. Avoid foods and drinks that have added sugar. Use healthy cooking methods, such as roasting, grilling, broiling, baking, poaching, steaming, and stir-frying. Do not fry your food except for stir-frying.  Lifestyle  Get regular exercise. Aim to exercise for a total of 150 minutes a week. Increase your activity level by  doing activities such as gardening, walking, and taking the stairs. Do not use any products that contain nicotine or tobacco, such as cigarettes, e-cigarettes, and chewing tobacco. If you need help quitting, ask your health care provider.  General instructions Take over-the-counter and prescription medicines only as told by your health care provider. Keep all follow-up visits as told by your health care provider. This is important. Where to find more information American Heart Association: www.heart.org National Heart, Lung, and Blood Institute: https://wilson-eaton.com/ Contact a health care provider if: You have trouble achieving or maintaining a healthy diet or weight. You are starting an exercise program. You are unable to stop smoking. Get help right away if: You have chest pain. You have trouble breathing. You have any symptoms of a stroke. "BE FAST" is an easy way to remember the main warning signs of a stroke: B - Balance. Signs are dizziness, sudden trouble walking, or loss of balance. E - Eyes. Signs are trouble seeing or a sudden change in vision. F - Face. Signs are sudden weakness or numbness of the face, or the face or eyelid drooping on one side. A - Arms. Signs are weakness or numbness in an arm. This happens suddenly and usually on one side of the body. S - Speech. Signs are sudden trouble speaking, slurred speech, or trouble understanding what people say. T -  Time. Time to call emergency services. Write down what time symptoms started. You have other signs of a stroke, such as: A sudden, severe headache with no known cause. Nausea or vomiting. Seizure. These symptoms may represent a serious problem that is an emergency. Do not wait to see if the symptoms will go away. Get medical help right away. Call your local emergency services (911 in the [place] Do not drive yourself to the hospital. Summary Cholesterol plaques increase your risk for heart attack and stroke. Work with your  health care provider to keep your cholesterol levels in a healthy range. Eat a healthy, balanced diet, get regular exercise, and maintain a healthy weight. Do not use any products that contain nicotine or tobacco, such as cigarettes, e-cigarettes, and chewing tobacco. Get help right away if you have any symptoms of a stroke. This information is not intended to replace advice given to you by your health care provider. Make sure you discuss any questions you have with your healthcare provider. Document Revised: 09/05/2019 Document Reviewed: 09/05/2019 Elsevier Patient Education  2022 [employer].

## 2021-04-12 NOTE — Telephone Encounter (Signed)
Rx sent during visit today.

## 2021-04-14 ENCOUNTER — Encounter: Payer: Self-pay | Admitting: Family Medicine

## 2021-04-17 ENCOUNTER — Other Ambulatory Visit: Payer: Self-pay | Admitting: Family Medicine

## 2021-04-19 ENCOUNTER — Ambulatory Visit (INDEPENDENT_AMBULATORY_CARE_PROVIDER_SITE_OTHER): Payer: BC Managed Care – PPO | Admitting: Endocrinology

## 2021-04-19 ENCOUNTER — Other Ambulatory Visit: Payer: Self-pay

## 2021-04-19 DIAGNOSIS — E0590 Thyrotoxicosis, unspecified without thyrotoxic crisis or storm: Secondary | ICD-10-CM | POA: Diagnosis not present

## 2021-04-19 LAB — T4, FREE: Free T4: 0.7 ng/dL (ref 0.60–1.60)

## 2021-04-19 LAB — TSH: TSH: 0.44 u[IU]/mL (ref 0.35–5.50)

## 2021-04-19 NOTE — Progress Notes (Signed)
   Subjective:    Patient ID: Darlene Griffith, female    DOB: 1951/08/30, 70 y.o.   MRN: [id number]  HPI Pt returns for f/u of hyperthyroidism (dx'ed 2009; 2019 US showed MNG). RAI was advised, but pt did not do, so she was rx'ed Tapazole; [place] (2022) showed stable to incremental enlargement of TI-RADS category 3 nodule in the right inferior gland. This nodule continues to meet criteria to consider fine-needle aspiration biopsy).    pt states she feels well in general.     Review of Systems Denies fever    Objective:   Physical Exam NECK: thyroid is slightly enlarged on the right, but I can't tell details.     Cytol (4970): benign follicular nodule ([place]da category II).    Lab Results  Component Value Date   TSH 0.44 04/19/2021      Assessment & Plan:  Hyperthyroidism: well-controlled off rx, but it will recur with time.  She can stay off tapazole for now.   MNG: plan is to recheck [place] in the future.

## 2021-04-19 NOTE — Patient Instructions (Addendum)
Blood tests are requested for you today.  We'll let you know about the results.  If it is overactive again, i'll prescribe "methimazole." If ever you have fever while taking methimazole, stop it and call us, even if the reason is obvious, because of the risk of a rare side-effect.   It is best to never miss the medication.  However, if you do miss it, next best is to double up the next time.   Please come back for a follow-up appointment in 3 months.

## 2021-04-22 ENCOUNTER — Encounter: Payer: Self-pay | Admitting: Endocrinology

## 2021-04-28 ENCOUNTER — Encounter (HOSPITAL_BASED_OUTPATIENT_CLINIC_OR_DEPARTMENT_OTHER): Payer: Self-pay | Admitting: Obstetrics and Gynecology

## 2021-04-28 ENCOUNTER — Emergency Department (HOSPITAL_BASED_OUTPATIENT_CLINIC_OR_DEPARTMENT_OTHER): Payer: BC Managed Care – PPO | Admitting: Radiology

## 2021-04-28 ENCOUNTER — Other Ambulatory Visit: Payer: Self-pay

## 2021-04-28 ENCOUNTER — Emergency Department (HOSPITAL_BASED_OUTPATIENT_CLINIC_OR_DEPARTMENT_OTHER)
Admission: EM | Admit: 2021-04-28 | Discharge: 2021-04-28 | Disposition: A | Discharge status: Home / Self Care | Payer: BC Managed Care – PPO | Attending: Emergency Medicine | Admitting: Emergency Medicine

## 2021-04-28 DIAGNOSIS — R0789 Other chest pain: Secondary | ICD-10-CM | POA: Insufficient documentation

## 2021-04-28 DIAGNOSIS — F17210 Nicotine dependence, cigarettes, uncomplicated: Secondary | ICD-10-CM | POA: Insufficient documentation

## 2021-04-28 DIAGNOSIS — R0602 Shortness of breath: Secondary | ICD-10-CM | POA: Diagnosis not present

## 2021-04-28 DIAGNOSIS — R079 Chest pain, unspecified: Secondary | ICD-10-CM

## 2021-04-28 DIAGNOSIS — Z79899 Other long term (current) drug therapy: Secondary | ICD-10-CM | POA: Insufficient documentation

## 2021-04-28 DIAGNOSIS — Z96698 Presence of other orthopedic joint implants: Secondary | ICD-10-CM | POA: Insufficient documentation

## 2021-04-28 DIAGNOSIS — I10 Essential (primary) hypertension: Secondary | ICD-10-CM | POA: Insufficient documentation

## 2021-04-28 LAB — BASIC METABOLIC PANEL
Anion gap: 8 (ref 5–15)
BUN: 8 mg/dL (ref 8–23)
CO2: 26 mmol/L (ref 22–32)
Calcium: 9 mg/dL (ref 8.9–10.3)
Chloride: 107 mmol/L (ref 98–111)
Creatinine, Ser: 0.66 mg/dL (ref 0.44–1.00)
GFR, Estimated: >60 mL/min (ref >60)
Glucose, Bld: 84 mg/dL (ref 70–99)
Potassium: 3.9 mmol/L (ref 3.5–5.1)
Sodium: 141 mmol/L (ref 135–145)

## 2021-04-28 LAB — CBC
HCT: 39.7 % (ref 36.0–46.0)
Hemoglobin: 12.7 g/dL (ref 12.0–15.0)
MCH: 33.3 pg (ref 26.0–34.0)
MCHC: 32 g/dL (ref 30.0–36.0)
MCV: 104.2 fL — ABNORMAL HIGH (ref 80.0–100.0)
Platelets: 282 10*3/uL (ref 150–400)
RBC: 3.81 MIL/uL — ABNORMAL LOW (ref 3.87–5.11)
RDW: 12.2 % (ref 11.5–15.5)
WBC: 8.8 10*3/uL (ref 4.0–10.5)
nRBC: 0 % (ref 0.0–0.2)

## 2021-04-28 LAB — TROPONIN I (HIGH SENSITIVITY): Troponin I (High Sensitivity): 4 ng/L (ref <18)

## 2021-04-28 MED ORDER — DICLOFENAC SODIUM 1 % EX GEL: 4.0000 g | Freq: Four times a day (QID) | CUTANEOUS | 0 refills | Status: AC

## 2021-04-28 NOTE — ED Triage Notes (Signed)
Patient reports to the ER for right sided chest pain, Shob, and right sided arm and breast side pain

## 2021-04-28 NOTE — ED Provider Notes (Signed)
[facility] DEPT Provider Note   CSN: [id number] Arrival date & time: 04/28/21  1217     History Chief Complaint  Patient presents with   Chest Pain    Darlene Griffith is a 70 y.o. female.  70 yo F with a chief complaints of right-sided chest pain going on for a few days now.  Worse with movement palpation and twisting deep breathing.  Denies trauma to the chest.  Has a mild cough off and on but nothing recent.  Denies fevers or chills.  Denies nausea vomiting or diarrhea.  Has a history of hypertension hyperlipidemia diabetes and is an active smoker.  States many heart attacks in her family.  The history is provided by the patient.  Chest Pain Pain location:  R chest and R lateral chest Pain quality: aching and sharp   Pain radiates to:  Does not radiate Pain severity:  Moderate Onset quality:  Gradual Duration:  3 days Timing:  Intermittent Progression:  Waxing and waning Chronicity:  New Relieved by:  Nothing Worsened by:  Coughing, certain positions, deep breathing and movement Ineffective treatments:  None tried Associated symptoms: no dizziness, no fever, no headache, no nausea, no palpitations, no shortness of breath and no vomiting       Past Medical History:  Diagnosis Date   Chest pain    a. 01/2015 Echo: Nl LV fxn, Gr 1 DD, triv AI, mild MR.   Essential hypertension    Family history of lung cancer    Hepatic cyst    a. noted on CT 01/2015.   Hyperthyroidism    GOING TO [facility] FOR SECOND OPINION   Multinodular goiter    a. 01/2015 CT chest: multinodular goidter w/ substernal extension of the left lobe of the thyroid assoc w/ rightward deviation of tracheal air column.   Neck pain, chronic    Personal history of colonic polyps    Pulmonary nodules    a. 01/2015 CT Chest: RLL ~ 47mm subpleural nodule - rec f/u in 6-12 mos.   Splenic cyst    a. noted on CT 01/2015.    Patient Active Problem List   Diagnosis Date Noted    Hyperthyroidism 04/19/2021   Snoring 03/02/2020   Tobacco use 03/02/2020   Genetic testing 02/10/2020   Family history of lung cancer    Personal history of colonic polyps    Dysphagia 01/06/2020   Cervical spondylosis with myelopathy and radiculopathy 07/05/2019   Neck pain, chronic 05/21/2019   Insomnia 07/27/2018   Multinodular goiter 07/14/2018   Chronic fatigue 06/15/2018   Midsternal chest pain 02/18/2015   Chest pain    Cough    Essential hypertension    Shortness of breath    Aortic stenosis    Pain in the chest    EKG abnormality    Malaise and fatigue    Lung nodule    Splenic cyst    Hepatic cyst    Hyperlipidemia    Hypertension 02/16/2015    Past Surgical History:  Procedure Laterality Date   CERVICAL DISC ARTHROPLASTY N/A 07/05/2019   Procedure: Cervical Six-Seven Artificial disc replacement;  Surgeon: Kristeen Miss, MD;  Location: [facility];  Service: Neurosurgery;  Laterality: N/A;  Cervical Six-Seven Artificial disc replacement   CESAREAN SECTION     COLONOSCOPY  01/13/2020   TONSILLECTOMY     AROUND 5-6 YRS OLD   UPPER GASTROINTESTINAL ENDOSCOPY  01/13/2020     OB History  Gravida      Para      Term      Preterm      AB      Living  4      SAB      IAB      Ectopic      Multiple      Live Births              Family History  Problem Relation Age of Onset   Heart attack Maternal Grandmother        deceased   High blood pressure Mother    High Cholesterol Mother    Thyroid disease Mother    Thyroid disease Sister    Lung cancer Father    Cancer Maternal Uncle        unk type   Cancer Sister        unk type   Colon cancer Neg Hx    Colon polyps Neg Hx    Esophageal cancer Neg Hx    Rectal cancer Neg Hx    Stomach cancer Neg Hx     Social History   Tobacco Use   Smoking status: Light Smoker    Pack years: 0.00    Types: Cigarettes   Smokeless tobacco: Never   Tobacco comments:    1-2 NOT EVERY DAY   Vaping  Use   Vaping Use: Never used  Substance Use Topics   Alcohol use: Yes    Alcohol/week: 0.0 standard drinks    Comment: rare   Drug use: No    Home Medications Prior to Admission medications   Medication Sig Start Date End Date Taking? Authorizing Provider  diclofenac Sodium (VOLTAREN) 1 % GEL Apply 4 g topically 4 (four) times daily. 04/28/21  Yes Deno Etienne, DO  amLODipine (NORVASC) 5 MG tablet Take 1 tablet (5 mg total) by mouth at bedtime. 04/12/21   [place], Betty G, MD  atorvastatin (LIPITOR) 40 MG tablet Take 1 tablet by mouth once daily 04/18/21   [place], Betty G, MD  carbamazepine (TEGRETOL) 100 MG chewable tablet Chew by mouth 4 (four) times daily. 03/22/20   [provider]  clotrimazole-betamethasone (LOTRISONE) cream Apply to affected area 2 times daily prn 04/24/20   Raylene Everts, MD  diazepam (VALIUM) 10 MG tablet  03/22/20   [provider]  EPITOL 200 MG tablet Take 200 mg by mouth 3 (three) times daily. 01/18/20   [provider]  fluconazole (DIFLUCAN) 150 MG tablet Take 1 tablet (150 mg total) by mouth daily. Repeat in 1 week if needed 04/24/20   Raylene Everts, MD  losartan (COZAAR) 25 MG tablet Take 1 tablet (25 mg total) by mouth daily. 04/12/21   [place], Betty G, MD  mirabegron ER (MYRBETRIQ) 25 MG TB24 tablet Take 1 tablet (25 mg total) by mouth daily. 08/03/20   [place], Betty G, MD  omeprazole (PRILOSEC) 20 MG capsule Take 20 mg BID for 14 days, then take daily for 14 days then stop 01/18/20   Armbruster, Carlota Raspberry, MD  Tiotropium Bromide Monohydrate (SPIRIVA RESPIMAT) 2.5 MCG/ACT AERS Inhale 2 puffs into the lungs daily. 06/08/20   Deneise Lever, MD    Allergies    Patient has no known allergies.  Review of Systems   Review of Systems  Constitutional:  Negative for chills and fever.  HENT:  Negative for congestion and rhinorrhea.   Eyes:  Negative for redness and visual disturbance.  Respiratory:  Negative for shortness of breath and  wheezing.   Cardiovascular:  Positive for chest pain. Negative for palpitations.  Gastrointestinal:  Negative for nausea and vomiting.  Genitourinary:  Negative for dysuria and urgency.  Musculoskeletal:  Negative for arthralgias and myalgias.  Skin:  Negative for pallor and wound.  Neurological:  Negative for dizziness and headaches.   Physical Exam Updated Vital Signs BP (!) 173/99 (BP Location: Right Arm)   Pulse 89   Temp 98.9 F (37.2 C) (Oral)   Resp (!) 21   Ht 5\' 1"  (1.549 m)   Wt 68 kg   SpO2 100%   BMI 28.33 kg/m   Physical Exam Vitals and nursing note reviewed.  Constitutional:      General: She is not in acute distress.    Appearance: She is well-developed. She is not diaphoretic.  HENT:     Head: Normocephalic and atraumatic.  Eyes:     Pupils: Pupils are equal, round, and reactive to light.  Cardiovascular:     Rate and Rhythm: Normal rate and regular rhythm.     Heart sounds: No murmur heard.   No friction rub. No gallop.  Pulmonary:     Effort: Pulmonary effort is normal.     Breath sounds: No wheezing or rales.  Chest:     Chest wall: Tenderness (About Pain with palpation of the right anterior chest wall reproduces the patient's symptoms the ribs 4 through 6 along the midclavicular line) present.  Abdominal:     General: There is no distension.     Palpations: Abdomen is soft.     Tenderness: There is no abdominal tenderness.  Musculoskeletal:        General: No tenderness.     Cervical back: Normal range of motion and neck supple.  Skin:    General: Skin is warm and dry.  Neurological:     Mental Status: She is alert and oriented to person, place, and time.  Psychiatric:        Behavior: Behavior normal.    ED Results / Procedures / Treatments   Labs (all labs ordered are listed, but only abnormal results are displayed) Labs Reviewed  CBC - Abnormal; Notable for the following components:      Result Value   RBC 3.81 (*)    MCV 104.2 (*)     All other components within normal limits  BASIC METABOLIC PANEL  TROPONIN I (HIGH SENSITIVITY)  TROPONIN I (HIGH SENSITIVITY)    EKG EKG Interpretation  Date/Time:  Sunday April 28 2021 12:27:43 EDT Ventricular Rate:  91 PR Interval:  195 QRS Duration: 85 QT Interval:  322 QTC Calculation: 397 R Axis:   55 Text Interpretation: Sinus rhythm Ventricular premature complex Probable left atrial enlargement Anterior infarct, old Borderline T abnormalities, inferior leads No significant change since last tracing Confirmed by Deno Etienne [phone]) on 04/28/2021 1:07:03 PM  Radiology DG Chest 2 View  Result Date: 04/28/2021 CLINICAL DATA:  Chest pain, shortness of breath EXAM: CHEST - 2 VIEW COMPARISON:  None. FINDINGS: The heart size and mediastinal contours are within normal limits. Both lungs are clear. The visualized skeletal structures are unremarkable. IMPRESSION: No acute abnormality of the lungs. Electronically Signed   By: Eddie Candle M.D.   On: 04/28/2021 13:05    Procedures Procedures   Medications Ordered in ED Medications - No data to display  ED Course  I have reviewed the triage vital signs and the nursing notes.  Pertinent labs & imaging results that were available during my care of the patient were reviewed by me and considered in my medical decision making (see chart for details).    MDM Rules/Calculators/A&P                          70 yo F with a chief complaints of right-sided chest pain.  Atypical in nature and reproduced on exam.  Multiple risk factors for MI.  Ongoing for past few days without significant change in the last 6 hours feel no reason to delta.  Completely atypical of PE feel no further work-up is warranted.  Troponin is negative.  Discharge the patient home.  PCP follow-up.  2:53 PM:  I have discussed the diagnosis/risks/treatment options with the patient and believe the pt to be eligible for discharge home to follow-up with PCP. We also discussed  returning to the ED immediately if new or worsening sx occur. We discussed the sx which are most concerning (e.g., sudden worsening pain, fever, inability to tolerate by mouth) that necessitate immediate return. Medications administered to the patient during their visit and any new prescriptions provided to the patient are listed below.  Medications given during this visit Medications - No data to display   The patient appears reasonably screen and/or stabilized for discharge and I doubt any other medical condition or other [facility] requiring further screening, evaluation, or treatment in the ED at this time prior to discharge.   Final Clinical Impression(s) / ED Diagnoses Final diagnoses:  Nonspecific chest pain    Rx / DC Orders ED Discharge Orders          Ordered    diclofenac Sodium (VOLTAREN) 1 % GEL  4 times daily        04/28/21 [place], [place], DO 04/28/21 1453

## 2021-04-28 NOTE — ED Notes (Signed)
Patient transported to X-ray 

## 2021-04-28 NOTE — Discharge Instructions (Addendum)
Use the gel as prescribed. Also take tylenol 1000mg (2 extra strength) four times a day.   Try not to lift greater than 10 pounds bend or twist at the waist.  You should continue to be active and up on your feet moving around doing her normal activities.  Follow-up with your family doctor in the office.

## 2021-05-11 ENCOUNTER — Encounter (HOSPITAL_BASED_OUTPATIENT_CLINIC_OR_DEPARTMENT_OTHER): Payer: Self-pay

## 2021-05-11 ENCOUNTER — Emergency Department (HOSPITAL_BASED_OUTPATIENT_CLINIC_OR_DEPARTMENT_OTHER)
Admission: EM | Admit: 2021-05-11 | Discharge: 2021-05-11 | Disposition: A | Discharge status: Home / Self Care | Payer: BC Managed Care – PPO | Attending: Emergency Medicine | Admitting: Emergency Medicine

## 2021-05-11 ENCOUNTER — Other Ambulatory Visit: Payer: Self-pay

## 2021-05-11 ENCOUNTER — Emergency Department (HOSPITAL_BASED_OUTPATIENT_CLINIC_OR_DEPARTMENT_OTHER): Payer: BC Managed Care – PPO | Admitting: Radiology

## 2021-05-11 DIAGNOSIS — I10 Essential (primary) hypertension: Secondary | ICD-10-CM | POA: Insufficient documentation

## 2021-05-11 DIAGNOSIS — M25571 Pain in right ankle and joints of right foot: Secondary | ICD-10-CM | POA: Diagnosis not present

## 2021-05-11 DIAGNOSIS — F17210 Nicotine dependence, cigarettes, uncomplicated: Secondary | ICD-10-CM | POA: Diagnosis not present

## 2021-05-11 DIAGNOSIS — M25471 Effusion, right ankle: Secondary | ICD-10-CM | POA: Diagnosis not present

## 2021-05-11 DIAGNOSIS — Z79899 Other long term (current) drug therapy: Secondary | ICD-10-CM | POA: Diagnosis not present

## 2021-05-11 DIAGNOSIS — M7731 Calcaneal spur, right foot: Secondary | ICD-10-CM | POA: Diagnosis not present

## 2021-05-11 DIAGNOSIS — M7989 Other specified soft tissue disorders: Secondary | ICD-10-CM | POA: Diagnosis not present

## 2021-05-11 MED ORDER — PREDNISONE 20 MG PO TABS: 40.0000 mg | ORAL_TABLET | Freq: Every day | ORAL | 0 refills | Status: AC

## 2021-05-11 MED ORDER — TRAMADOL HCL 50 MG PO TABS: 50.0000 mg | ORAL_TABLET | Freq: Four times a day (QID) | ORAL | 0 refills | Status: DC | PRN

## 2021-05-11 MED ORDER — PREDNISONE 50 MG PO TABS
60.0000 mg | ORAL_TABLET | Freq: Once | ORAL | Status: AC
  Administered 2021-05-11: 60 mg via ORAL
  Filled 2021-05-11: qty 1

## 2021-05-11 MED ORDER — TRAMADOL HCL 50 MG PO TABS
50.0000 mg | ORAL_TABLET | Freq: Once | ORAL | Status: AC
Start: 2021-05-11 — End: 2021-05-11
  Administered 2021-05-11: 50 mg via ORAL
  Filled 2021-05-11: qty 1

## 2021-05-11 NOTE — ED Notes (Signed)
ED Provider at bedside. 

## 2021-05-11 NOTE — Discharge Instructions (Addendum)
No fracture on your x-ray today.  Wear walking boot for support as needed.  Recommend ice, Tylenol, ibuprofen.  Take steroid as prescribed as well.  Follow-up with your primary care doctor next week for reevaluation.  Take next dose of steroids tomorrow.

## 2021-05-11 NOTE — ED Triage Notes (Signed)
Pt presents with right ankle pain beginning Thursday evening.   Denies any fall or injury.  Pain has gotten worse since Thursday.

## 2021-05-11 NOTE — ED Notes (Signed)
Pt discharged to home.  Discharge instructions have been discussed with pt and/or family members.  Pt verbally acknowledges understanding of discharge instructions and endorses comprehension to check out at registration prior to leaving. 

## 2021-05-11 NOTE — ED Provider Notes (Signed)
[facility] DEPT Provider Note   CSN: [id number] Arrival date & time: 05/11/21  [id number]     History Chief Complaint  Patient presents with   Ankle Pain    Darlene Griffith is a 70 y.o. female.  The history is provided by the patient.  Ankle Pain Location:  Ankle Time since incident:  3 days Ankle location:  R ankle Pain details:    Quality:  Aching   Radiates to:  Does not radiate   Severity:  Mild   Onset quality:  Gradual   Duration:  3 days   Timing:  Constant   Progression:  Unchanged Chronicity:  New Relieved by:  Nothing Worsened by:  Bearing weight Ineffective treatments:  Acetaminophen Associated symptoms: swelling   Associated symptoms: no back pain, no decreased ROM, no fatigue, no fever, no itching, no muscle weakness, no neck pain, no numbness, no stiffness and no tingling       Past Medical History:  Diagnosis Date   Chest pain    a. 01/2015 Echo: Nl LV fxn, Gr 1 DD, triv AI, mild MR.   Essential hypertension    Family history of lung cancer    Hepatic cyst    a. noted on CT 01/2015.   Hyperthyroidism    GOING TO [facility] FOR SECOND OPINION   Multinodular goiter    a. 01/2015 CT chest: multinodular goidter w/ substernal extension of the left lobe of the thyroid assoc w/ rightward deviation of tracheal air column.   Neck pain, chronic    Personal history of colonic polyps    Pulmonary nodules    a. 01/2015 CT Chest: RLL ~ 55m subpleural nodule - rec f/u in 6-12 mos.   Splenic cyst    a. noted on CT 01/2015.    Patient Active Problem List   Diagnosis Date Noted   Hyperthyroidism 04/19/2021   Snoring 03/02/2020   Tobacco use 03/02/2020   Genetic testing 02/10/2020   Family history of lung cancer    Personal history of colonic polyps    Dysphagia 01/06/2020   Cervical spondylosis with myelopathy and radiculopathy 07/05/2019   Neck pain, chronic 05/21/2019   Insomnia 07/27/2018   Multinodular goiter 07/14/2018   Chronic fatigue  06/15/2018   Midsternal chest pain 02/18/2015   Chest pain    Cough    Essential hypertension    Shortness of breath    Aortic stenosis    Pain in the chest    EKG abnormality    Malaise and fatigue    Lung nodule    Splenic cyst    Hepatic cyst    Hyperlipidemia    Hypertension 02/16/2015    Past Surgical History:  Procedure Laterality Date   CERVICAL DISC ARTHROPLASTY N/A 07/05/2019   Procedure: Cervical Six-Seven Artificial disc replacement;  Surgeon: EKristeen Miss MD;  Location: [facility]  Service: Neurosurgery;  Laterality: N/A;  Cervical Six-Seven Artificial disc replacement   CESAREAN SECTION     COLONOSCOPY  01/13/2020   TONSILLECTOMY     AROUND 5-6 YRS OLD   UPPER GASTROINTESTINAL ENDOSCOPY  01/13/2020     OB History     Gravida      Para      Term      Preterm      AB      Living  4      SAB      IAB      Ectopic  Multiple      Live Births              Family History  Problem Relation Age of Onset   Heart attack Maternal Grandmother        deceased   High blood pressure Mother    High Cholesterol Mother    Thyroid disease Mother    Thyroid disease Sister    Lung cancer Father    Cancer Maternal Uncle        unk type   Cancer Sister        unk type   Colon cancer Neg Hx    Colon polyps Neg Hx    Esophageal cancer Neg Hx    Rectal cancer Neg Hx    Stomach cancer Neg Hx     Social History   Tobacco Use   Smoking status: Light Smoker    Types: Cigarettes   Smokeless tobacco: Never   Tobacco comments:    1-2 NOT EVERY DAY   Vaping Use   Vaping Use: Never used  Substance Use Topics   Alcohol use: Yes    Alcohol/week: 0.0 standard drinks    Comment: rare   Drug use: No    Home Medications Prior to Admission medications   Medication Sig Start Date End Date Taking? Authorizing Provider  predniSONE (DELTASONE) 20 MG tablet Take 2 tablets (40 mg total) by mouth daily for 4 days. 05/11/21 05/15/21 Yes Judithann Villamar, DO   traMADol (ULTRAM) 50 MG tablet Take 1 tablet (50 mg total) by mouth every 6 (six) hours as needed. 05/11/21  Yes Brynda Heick, DO  amLODipine (NORVASC) 5 MG tablet Take 1 tablet (5 mg total) by mouth at bedtime. 04/12/21   [place], Betty G, MD  atorvastatin (LIPITOR) 40 MG tablet Take 1 tablet by mouth once daily 04/18/21   [place], Betty G, MD  carbamazepine (TEGRETOL) 100 MG chewable tablet Chew by mouth 4 (four) times daily. 03/22/20   [provider]  clotrimazole-betamethasone (LOTRISONE) cream Apply to affected area 2 times daily prn 04/24/20   Raylene Everts, MD  diazepam (VALIUM) 10 MG tablet  03/22/20   [provider]  diclofenac Sodium (VOLTAREN) 1 % GEL Apply 4 g topically 4 (four) times daily. 04/28/21   Deno Etienne, DO  EPITOL 200 MG tablet Take 200 mg by mouth 3 (three) times daily. 01/18/20   [provider]  fluconazole (DIFLUCAN) 150 MG tablet Take 1 tablet (150 mg total) by mouth daily. Repeat in 1 week if needed 04/24/20   Raylene Everts, MD  losartan (COZAAR) 25 MG tablet Take 1 tablet (25 mg total) by mouth daily. 04/12/21   [place], Betty G, MD  mirabegron ER (MYRBETRIQ) 25 MG TB24 tablet Take 1 tablet (25 mg total) by mouth daily. 08/03/20   [place], Betty G, MD  omeprazole (PRILOSEC) 20 MG capsule Take 20 mg BID for 14 days, then take daily for 14 days then stop 01/18/20   Armbruster, Carlota Raspberry, MD  Tiotropium Bromide Monohydrate (SPIRIVA RESPIMAT) 2.5 MCG/ACT AERS Inhale 2 puffs into the lungs daily. 06/08/20   Deneise Lever, MD    Allergies    Patient has no known allergies.  Review of Systems   Review of Systems  Constitutional:  Negative for fatigue and fever.  Cardiovascular:  Negative for leg swelling.  Musculoskeletal:  Positive for arthralgias and gait problem. Negative for back pain, myalgias, neck pain, neck stiffness and stiffness.  Skin:  Negative  for color change, itching, pallor, rash and wound.  Neurological:  Negative for weakness  and numbness.   Physical Exam Updated Vital Signs BP 136/88 (BP Location: Right Arm)   Pulse 86   Resp 18   SpO2 100%   Physical Exam Constitutional:      General: She is not in acute distress.    Appearance: She is not ill-appearing.  Cardiovascular:     Pulses: Normal pulses.  Musculoskeletal:        General: Swelling and tenderness present. Normal range of motion.     Right lower leg: No edema.     Left lower leg: No edema.     Comments: Tenderness and swelling to the right lateral malleolus with no overlying erythema or warmth, good range of motion of the right ankle  Skin:    General: Skin is warm.     Capillary Refill: Capillary refill takes less than 2 seconds.     Findings: No erythema.  Neurological:     General: No focal deficit present.     Mental Status: She is alert.     Sensory: No sensory deficit.     Motor: No weakness.    ED Results / Procedures / Treatments   Labs (all labs ordered are listed, but only abnormal results are displayed) Labs Reviewed - No data to display  EKG None  Radiology DG Ankle Complete Right  Result Date: 05/11/2021 CLINICAL DATA:  Pain without injury EXAM: RIGHT ANKLE - COMPLETE 3+ VIEW COMPARISON:  None. FINDINGS: Soft tissue swelling, particularly laterally. No fractures or dislocations. Enthesopathic changes associated with the distal Achilles insertion site. Small plantar calcaneal spur. IMPRESSION: Soft tissue swelling. No acute bony abnormalities identified. No fractures. Electronically Signed   By: Dorise Bullion III M.D   On: 05/11/2021 10:11    Procedures Procedures   Medications Ordered in ED Medications  predniSONE (DELTASONE) tablet 60 mg (has no administration in time range)  traMADol (ULTRAM) tablet 50 mg (50 mg Oral Given 05/11/21 0920)    ED Course  I have reviewed the triage vital signs and the nursing notes.  Pertinent labs & imaging results that were available during my care of the patient were reviewed  by me and considered in my medical decision making (see chart for details).    MDM Rules/Calculators/A&P                           Abigail Butts is here with right ankle pain.  Normal vitals.  No fever.  Not sure of any trauma but possible.  Has some swelling to the right lateral malleoli.  Good range of motion of the ankle.  No fever, no warmth, no erythema no concern for septic joint.  Likely an inflammatory process versus a mild sprain.  X-ray shows no fracture.  Will prescribe tramadol and prednisone and place her in a cam boot walker.  No concern for DVT or peripheral arterial disease.  Has good pulses.  Neurovascular neuromuscularly intact otherwise.  May be a gout or pseudogout.  We will have her follow-up with her primary care doctor.  Discharged in good condition.  Understands return precautions.  This chart was dictated using voice recognition software.  Despite best efforts to proofread,  errors can occur which can change the documentation meaning.   Final Clinical Impression(s) / ED Diagnoses Final diagnoses:  Acute right ankle pain    Rx / DC Orders ED Discharge  Orders          Ordered    predniSONE (DELTASONE) 20 MG tablet  Daily        05/11/21 1015    traMADol (ULTRAM) 50 MG tablet  Every 6 hours PRN        05/11/21 1018             Felipa Laroche, DO 05/11/21 1020

## 2021-05-15 ENCOUNTER — Inpatient Hospital Stay (HOSPITAL_COMMUNITY): Admission: RE | Admit: 2021-05-15 | Payer: BC Managed Care – PPO | Source: Ambulatory Visit

## 2021-05-15 ENCOUNTER — Ambulatory Visit (HOSPITAL_COMMUNITY)
Admission: RE | Admit: 2021-05-15 | Discharge: 2021-05-15 | Disposition: A | Discharge status: Home / Self Care | Payer: BC Managed Care – PPO | Source: Ambulatory Visit | Attending: Family Medicine | Admitting: Family Medicine

## 2021-05-15 ENCOUNTER — Encounter: Payer: Self-pay | Admitting: Family Medicine

## 2021-05-15 ENCOUNTER — Other Ambulatory Visit: Payer: Self-pay

## 2021-05-15 ENCOUNTER — Ambulatory Visit (INDEPENDENT_AMBULATORY_CARE_PROVIDER_SITE_OTHER): Payer: BC Managed Care – PPO | Admitting: Family Medicine

## 2021-05-15 VITALS — BP 140/80 | HR 88 | Resp 16 | Ht 61.0 in

## 2021-05-15 DIAGNOSIS — M79661 Pain in right lower leg: Secondary | ICD-10-CM | POA: Diagnosis not present

## 2021-05-15 DIAGNOSIS — M25571 Pain in right ankle and joints of right foot: Secondary | ICD-10-CM | POA: Diagnosis not present

## 2021-05-15 LAB — URIC ACID: Uric Acid, Serum: 4.3 mg/dL (ref 2.4–7.0)

## 2021-05-15 NOTE — Progress Notes (Signed)
Lower extremity venous has been completed.   Preliminary results in CV Proc.   Abram Sander 05/15/2021 1:32 PM

## 2021-05-15 NOTE — Progress Notes (Signed)
HPI:  [name] is a 70 y.o. female, who is here today to follow on recent ED visit.  She was evaluated in the ED on 05/11/21 because sudden onset of right ankle pain radiated to foot and edema. "Excruciating pain He was discharged on Tramadol,prednisone,and topical Diclofenac. She felt like pain was improving 2 days after visit but yesterday pain got worse again.  Ankle Pain  The incident occurred 3 to 5 days ago. The incident occurred at home. There was no injury mechanism. The pain is present in the right leg, right ankle, right foot, right toes and right heel. The quality of the pain is described as aching, shooting and stabbing (Throbbing). The pain is severe. The pain has been Constant since onset. Associated symptoms include an inability to bear weight. Pertinent negatives include no loss of motion, loss of sensation, muscle weakness, numbness or tingling. She reports no foreign bodies present. The symptoms are aggravated by weight bearing and movement. She has tried elevation, immobilization, rest and non-weight bearing for the symptoms. The treatment provided mild relief.    Yesterday pain started radiating to calf and it was "very red." Negative for CP,palpitation,SOB,or diaphoresis.  She wonders if topical Diclofenac have caused edema. No hx of trauma or unusual activity. She is wearing a long surgical boot, which has helped with pain.  She had similar symptoms 2 weeks ago, edema of right foot dorsal aspect and "a lot" of pain, resolved. No hx of gout.  Review of Systems  Constitutional:  Positive for fatigue. Negative for chills and fever.  Respiratory:  Negative for cough and wheezing.   Gastrointestinal:  Negative for abdominal pain, nausea and vomiting.  Genitourinary:  Negative for decreased urine volume, dysuria and hematuria.  Musculoskeletal:  Positive for arthralgias. Negative for joint swelling.  Skin:  Negative for pallor and wound.  Neurological:   Negative for tingling, weakness and numbness.  Psychiatric/Behavioral:  Positive for sleep disturbance. Negative for confusion. The patient is nervous/anxious.   Rest see pertinent positives and negatives per HPI.  Current Outpatient Medications on File Prior to Visit  Medication Sig Dispense Refill   amLODipine (NORVASC) 5 MG tablet Take 1 tablet (5 mg total) by mouth at bedtime. 90 tablet 1   atorvastatin (LIPITOR) 40 MG tablet Take 1 tablet by mouth once daily 90 tablet 1   carbamazepine (TEGRETOL) 100 MG chewable tablet Chew by mouth 4 (four) times daily.     clotrimazole-betamethasone (LOTRISONE) cream Apply to affected area 2 times daily prn 15 g 0   diazepam (VALIUM) 10 MG tablet      diclofenac Sodium (VOLTAREN) 1 % GEL Apply 4 g topically 4 (four) times daily. 100 g 0   EPITOL 200 MG tablet Take 200 mg by mouth 3 (three) times daily.     fluconazole (DIFLUCAN) 150 MG tablet Take 1 tablet (150 mg total) by mouth daily. Repeat in 1 week if needed 2 tablet 0   losartan (COZAAR) 25 MG tablet Take 1 tablet (25 mg total) by mouth daily. 30 tablet 1   mirabegron ER (MYRBETRIQ) 25 MG TB24 tablet Take 1 tablet (25 mg total) by mouth daily. 30 tablet 2   omeprazole (PRILOSEC) 20 MG capsule Take 20 mg BID for 14 days, then take daily for 14 days then stop 42 capsule 0   Tiotropium Bromide Monohydrate (SPIRIVA RESPIMAT) 2.5 MCG/ACT AERS Inhale 2 puffs into the lungs daily. 4 g 0   traMADol (ULTRAM) 50 MG tablet  Take 1 tablet (50 mg total) by mouth every 6 (six) hours as needed. 15 tablet 0   No current facility-administered medications on file prior to visit.   Past Medical History:  Diagnosis Date   Chest pain    a. 01/2015 Echo: Nl LV fxn, Gr 1 DD, triv AI, mild MR.   Essential hypertension    Family history of lung cancer    Hepatic cyst    a. noted on CT 01/2015.   Hyperthyroidism    GOING TO [facility] FOR SECOND OPINION   Multinodular goiter    a. 01/2015 CT chest: multinodular goidter w/  substernal extension of the left lobe of the thyroid assoc w/ rightward deviation of tracheal air column.   Neck pain, chronic    Personal history of colonic polyps    Pulmonary nodules    a. 01/2015 CT Chest: RLL ~ 6m subpleural nodule - rec f/u in 6-12 mos.   Splenic cyst    a. noted on CT 01/2015.   No Known Allergies  Social History   Socioeconomic History   Marital status: Single    Spouse name: Not on file   Number of children: Not on file   Years of education: Not on file   Highest education level: Not on file  Occupational History   Occupation: retired  Tobacco Use   Smoking status: Light Smoker    Types: Cigarettes   Smokeless tobacco: Never   Tobacco comments:    1-2 NOT EVERY DAY   Vaping Use   Vaping Use: Never used  Substance and Sexual Activity   Alcohol use: Yes    Alcohol/week: 0.0 standard drinks    Comment: rare   Drug use: No   Sexual activity: Not Currently  Other Topics Concern   Not on file  Social History Narrative   Not on file   Social Determinants of Health   Financial Resource Strain: Low Risk    Difficulty of Paying Living Expenses: Not hard at all  Food Insecurity: No Food Insecurity   Worried About RCharity fundraiserin the Last Year: Never true   RVenetiein the Last Year: Never true  Transportation Needs: No Transportation Needs   Lack of Transportation (Medical): No   Lack of Transportation (Non-Medical): No  Physical Activity: Inactive   Days of Exercise per Week: 0 days   Minutes of Exercise per Session: 0 min  Stress: No Stress Concern Present   Feeling of Stress : Not at all  Social Connections: Moderately Isolated   Frequency of Communication with Friends and Family: More than three times a week   Frequency of Social Gatherings with Friends and Family: More than three times a week   Attends Religious Services: More than 4 times per year   Active Member of Clubs or Organizations: No   Attends CArchivist Meetings: Never   Marital Status: Never married   Vitals:   05/15/21 0920  BP: 140/80  Pulse: 88  Resp: 16  SpO2: 98%   Body mass index is 28.33 kg/m.  Physical Exam Vitals and nursing note reviewed.  Constitutional:      General: She is not in acute distress.    Appearance: She is well-developed. She is not ill-appearing.  HENT:     Head: Normocephalic and atraumatic.  Eyes:     Conjunctiva/sclera: Conjunctivae normal.  Cardiovascular:     Rate and Rhythm: Normal rate and regular rhythm.  Pulses:          Dorsalis pedis pulses are 2+ on the right side and 2+ on the left side.     Heart sounds: Murmur (SEM I-II/VI RUSB and LUSB) heard.     Comments: Calf tenderness upon palpation, no edema or erythema. Pain elicited with right foot dorsiflexion.  Pulmonary:     Effort: Pulmonary effort is normal. No respiratory distress.  Musculoskeletal:     Right lower leg: Edema (periankle.) present.     Right ankle: Tenderness present over the lateral malleolus. Decreased range of motion.     Right Achilles Tendon: Tenderness present. No defects. Thompson's test negative.     Comments: Antalgic gait.   Skin:    General: Skin is warm.     Findings: No erythema or rash.  Neurological:     Mental Status: She is alert and oriented to person, place, and time.  Psychiatric:        Mood and Affect: Mood is anxious.     Comments: Well groomed, good eye contact.   ASSESSMENT AND PLAN:  [name] was seen today for hospitalization follow-up.  Diagnoses and all orders for this visit: Orders Placed This Encounter  Procedures   Uric acid   VAS [place] LOWER EXTREMITY VENOUS (DVT)   Lab Results  Component Value Date   LABURIC 4.3 05/15/2021   Acute right ankle pain We discussed possible etiologies. OA,gout,tenosynovitis among some. Continue Tramadol and complete Prednisone treatment. Further recommendations according to lab results.  Right calf pain LE elevation. LE [place]  arranged for today at 12:20 pm to evaluate for DVT. Encouraged smoking cessation. Clearly instructed about warning signs.  Return if symptoms worsen or fail to improve.   Keante Urizar G. Martinique, MD  [facility]. [place] office.

## 2021-05-15 NOTE — Patient Instructions (Signed)
A few things to remember from today's visit:  Acute right ankle pain - Plan: Uric acid  Right calf pain - Plan: VAS [place] LOWER EXTREMITY VENOUS (DVT)  Continue wear boot. Elevation. We can arrange appt with ortho or podiatrist of persistent and depending of lab results.  Do not use My Chart to request refills or for acute issues that need immediate attention.   Please be sure medication list is accurate. If a new problem present, please set up appointment sooner than planned today.

## 2021-05-17 ENCOUNTER — Ambulatory Visit: Payer: Medicare Other | Admitting: Family Medicine

## 2021-05-31 ENCOUNTER — Telehealth: Payer: Self-pay | Admitting: Family Medicine

## 2021-05-31 NOTE — Telephone Encounter (Signed)
Pt call and stated she need a clarence letter sent to [employer] today so they can pull her teeth .she stated that she had 3 teeth to brake in one day and is in a lot of pain pt stated the fax # is [phone] and the ph # is [phone] opt 2.Pt stated she would like a call when it is sent.

## 2021-05-31 NOTE — Telephone Encounter (Signed)
I do not see any contraindication for having dental extraction. Thanks, BJ

## 2021-05-31 NOTE — Telephone Encounter (Signed)
Letter created & faxed to the fax number provided below, received confirmation it went through. I tried contacting patient to let her know, but her mailbox is not set up.

## 2021-08-10 ENCOUNTER — Other Ambulatory Visit: Payer: Self-pay | Admitting: Family Medicine

## 2021-08-10 DIAGNOSIS — N3941 Urge incontinence: Secondary | ICD-10-CM

## 2021-08-17 ENCOUNTER — Emergency Department (HOSPITAL_BASED_OUTPATIENT_CLINIC_OR_DEPARTMENT_OTHER)
Admission: EM | Admit: 2021-08-17 | Discharge: 2021-08-17 | Disposition: A | Discharge status: Home / Self Care | Payer: BC Managed Care – PPO | Attending: Emergency Medicine | Admitting: Emergency Medicine

## 2021-08-17 ENCOUNTER — Emergency Department (HOSPITAL_BASED_OUTPATIENT_CLINIC_OR_DEPARTMENT_OTHER): Payer: BC Managed Care – PPO

## 2021-08-17 ENCOUNTER — Other Ambulatory Visit: Payer: Self-pay

## 2021-08-17 DIAGNOSIS — I10 Essential (primary) hypertension: Secondary | ICD-10-CM | POA: Insufficient documentation

## 2021-08-17 DIAGNOSIS — J111 Influenza due to unidentified influenza virus with other respiratory manifestations: Secondary | ICD-10-CM | POA: Diagnosis not present

## 2021-08-17 DIAGNOSIS — J101 Influenza due to other identified influenza virus with other respiratory manifestations: Secondary | ICD-10-CM | POA: Insufficient documentation

## 2021-08-17 DIAGNOSIS — Z20822 Contact with and (suspected) exposure to covid-19: Secondary | ICD-10-CM | POA: Diagnosis not present

## 2021-08-17 DIAGNOSIS — Z79899 Other long term (current) drug therapy: Secondary | ICD-10-CM | POA: Insufficient documentation

## 2021-08-17 DIAGNOSIS — F17210 Nicotine dependence, cigarettes, uncomplicated: Secondary | ICD-10-CM | POA: Diagnosis not present

## 2021-08-17 DIAGNOSIS — R509 Fever, unspecified: Secondary | ICD-10-CM | POA: Diagnosis not present

## 2021-08-17 DIAGNOSIS — R059 Cough, unspecified: Secondary | ICD-10-CM | POA: Diagnosis not present

## 2021-08-17 LAB — RESP PANEL BY RT-PCR (FLU A&B, COVID) ARPGX2
Influenza A by PCR: POSITIVE — AB
Influenza B by PCR: NEGATIVE
SARS Coronavirus 2 by RT PCR: NEGATIVE

## 2021-08-17 MED ORDER — ACETAMINOPHEN 500 MG PO TABS
1000.0000 mg | ORAL_TABLET | Freq: Once | ORAL | Status: AC
  Administered 2021-08-17: 1000 mg via ORAL
  Filled 2021-08-17: qty 2

## 2021-08-17 NOTE — ED Triage Notes (Signed)
Pt arrives pov with c/o cough, fever, body aches, HA and chills x 3 days.

## 2021-08-17 NOTE — ED Provider Notes (Signed)
[place] EMERGENCY DEPT Provider Note   CSN: [id number] Arrival date & time: 08/17/21  1650     History Chief Complaint  Patient presents with   Cough    Darlene Griffith is a 70 y.o. female presenting to the emergency department with 4 days of cough, congestion, nausea, headache, myalgias, fatigue.  She reports no appetite.  She did not get the flu vaccines.  She lives by herself.  He does have family nearby.  She has not been taking any over-the-counter antipyretics.  HPI     Past Medical History:  Diagnosis Date   Chest pain    a. 01/2015 Echo: Nl LV fxn, Gr 1 DD, triv AI, mild MR.   Essential hypertension    Family history of lung cancer    Hepatic cyst    a. noted on CT 01/2015.   Hyperthyroidism    GOING TO [facility] FOR SECOND OPINION   Multinodular goiter    a. 01/2015 CT chest: multinodular goidter w/ substernal extension of the left lobe of the thyroid assoc w/ rightward deviation of tracheal air column.   Neck pain, chronic    Personal history of colonic polyps    Pulmonary nodules    a. 01/2015 CT Chest: RLL ~ 33mm subpleural nodule - rec f/u in 6-12 mos.   Splenic cyst    a. noted on CT 01/2015.    Patient Active Problem List   Diagnosis Date Noted   Hyperthyroidism 04/19/2021   Snoring 03/02/2020   Tobacco use 03/02/2020   Genetic testing 02/10/2020   Family history of lung cancer    Personal history of colonic polyps    Dysphagia 01/06/2020   Cervical spondylosis with myelopathy and radiculopathy 07/05/2019   Neck pain, chronic 05/21/2019   Insomnia 07/27/2018   Multinodular goiter 07/14/2018   Chronic fatigue 06/15/2018   Midsternal chest pain 02/18/2015   Chest pain    Cough    Essential hypertension    Shortness of breath    Aortic stenosis    Pain in the chest    EKG abnormality    Malaise and fatigue    Lung nodule    Splenic cyst    Hepatic cyst    Hyperlipidemia    Hypertension 02/16/2015    Past Surgical History:   Procedure Laterality Date   CERVICAL DISC ARTHROPLASTY N/A 07/05/2019   Procedure: Cervical Six-Seven Artificial disc replacement;  Surgeon: Kristeen Miss, MD;  Location: [facility];  Service: Neurosurgery;  Laterality: N/A;  Cervical Six-Seven Artificial disc replacement   CESAREAN SECTION     COLONOSCOPY  01/13/2020   TONSILLECTOMY     AROUND 5-6 YRS OLD   UPPER GASTROINTESTINAL ENDOSCOPY  01/13/2020     OB History     Gravida      Para      Term      Preterm      AB      Living  4      SAB      IAB      Ectopic      Multiple      Live Births              Family History  Problem Relation Age of Onset   Heart attack Maternal Grandmother        deceased   High blood pressure Mother    High Cholesterol Mother    Thyroid disease Mother    Thyroid disease Sister  Lung cancer Father    Cancer Maternal Uncle        unk type   Cancer Sister        unk type   Colon cancer Neg Hx    Colon polyps Neg Hx    Esophageal cancer Neg Hx    Rectal cancer Neg Hx    Stomach cancer Neg Hx     Social History   Tobacco Use   Smoking status: Light Smoker    Types: Cigarettes   Smokeless tobacco: Never   Tobacco comments:    1-2 NOT EVERY DAY   Vaping Use   Vaping Use: Never used  Substance Use Topics   Alcohol use: Yes    Alcohol/week: 0.0 standard drinks    Comment: rare   Drug use: No    Home Medications Prior to Admission medications   Medication Sig Start Date End Date Taking? Authorizing Provider  amLODipine (NORVASC) 5 MG tablet Take 1 tablet (5 mg total) by mouth at bedtime. 04/12/21   Martinique, Betty G, MD  atorvastatin (LIPITOR) 40 MG tablet Take 1 tablet by mouth once daily 04/18/21   Martinique, Betty G, MD  carbamazepine (TEGRETOL) 100 MG chewable tablet Chew by mouth 4 (four) times daily. 03/22/20   [provider]  clotrimazole-betamethasone (LOTRISONE) cream Apply to affected area 2 times daily prn 04/24/20   Raylene Everts, MD  diazepam  (VALIUM) 10 MG tablet  03/22/20   [provider]  diclofenac Sodium (VOLTAREN) 1 % GEL Apply 4 g topically 4 (four) times daily. 04/28/21   Deno Etienne, DO  EPITOL 200 MG tablet Take 200 mg by mouth 3 (three) times daily. 01/18/20   [provider]  fluconazole (DIFLUCAN) 150 MG tablet Take 1 tablet (150 mg total) by mouth daily. Repeat in 1 week if needed 04/24/20   Raylene Everts, MD  losartan (COZAAR) 25 MG tablet Take 1 tablet (25 mg total) by mouth daily. 04/12/21   Martinique, Betty G, MD  MYRBETRIQ 25 MG TB24 tablet Take 1 tablet by mouth once daily 08/12/21   Martinique, Betty G, MD  omeprazole (PRILOSEC) 20 MG capsule Take 20 mg BID for 14 days, then take daily for 14 days then stop 01/18/20   Armbruster, Carlota Raspberry, MD  Tiotropium Bromide Monohydrate (SPIRIVA RESPIMAT) 2.5 MCG/ACT AERS Inhale 2 puffs into the lungs daily. 06/08/20   Deneise Lever, MD  traMADol (ULTRAM) 50 MG tablet Take 1 tablet (50 mg total) by mouth every 6 (six) hours as needed. 05/11/21   Lennice Sites, DO    Allergies    Patient has no known allergies.  Review of Systems   Review of Systems  Constitutional:  Positive for activity change, appetite change, chills, fatigue and fever.  HENT:  Positive for congestion and ear pain. Negative for trouble swallowing.   Eyes:  Negative for pain and visual disturbance.  Respiratory:  Positive for cough. Negative for shortness of breath.   Cardiovascular:  Negative for chest pain and palpitations.  Gastrointestinal:  Positive for nausea. Negative for abdominal pain and vomiting.  Genitourinary:  Negative for dysuria and hematuria.  Musculoskeletal:  Negative for arthralgias and back pain.  Skin:  Negative for color change and rash.  Neurological:  Positive for headaches. Negative for seizures and syncope.  All other systems reviewed and are negative.  Physical Exam Updated Vital Signs BP (!) 157/97 (BP Location: Left Arm)   Pulse (!) 101   Temp 99.7 F (  37.6  C) (Oral)   Resp 18   Ht 5\' 1"  (1.549 m)   Wt 68 kg   SpO2 93%   BMI 28.34 kg/m   Physical Exam Constitutional:      General: She is not in acute distress. HENT:     Head: Normocephalic and atraumatic.  Eyes:     Conjunctiva/sclera: Conjunctivae normal.     Pupils: Pupils are equal, round, and reactive to light.  Cardiovascular:     Rate and Rhythm: Normal rate and regular rhythm.  Pulmonary:     Effort: Pulmonary effort is normal. No respiratory distress.  Abdominal:     General: There is no distension.     Tenderness: There is no abdominal tenderness.  Skin:    General: Skin is warm and dry.  Neurological:     General: No focal deficit present.     Mental Status: She is alert. Mental status is at baseline.  Psychiatric:        Mood and Affect: Mood normal.        Behavior: Behavior normal.    ED Results / Procedures / Treatments   Labs (all labs ordered are listed, but only abnormal results are displayed) Labs Reviewed  RESP PANEL BY RT-PCR (FLU A&B, COVID) ARPGX2 - Abnormal; Notable for the following components:      Result Value   Influenza A by PCR POSITIVE (*)    All other components within normal limits    EKG None  Radiology DG Chest Portable 1 View  Result Date: 08/17/2021 CLINICAL DATA:  Cough, fever. EXAM: PORTABLE CHEST 1 VIEW COMPARISON:  April 28, 2021. FINDINGS: The heart size and mediastinal contours are within normal limits. Both lungs are clear. The visualized skeletal structures are unremarkable. IMPRESSION: No active disease. Electronically Signed   By: Marijo Conception M.D.   On: 08/17/2021 18:17    Procedures Procedures   Medications Ordered in ED Medications - No data to display  ED Course  I have reviewed the triage vital signs and the nursing notes.  Pertinent labs & imaging results that were available during my care of the patient were reviewed by me and considered in my medical decision making (see chart for details).  Patient  is influenza positive.  Here on day 4 of symptoms.  No hypoxia.  Does not appear clinically dehydrated.  I recommended she continue drinking water aggressively at home, that her family help support her for the next 3 to 4 days as the virus completes his natural course.  There is no indication at this point for tamiflu treatment.  She is not hypoxic.  Personally reviewed interpret her chest x-ray shows no focal infiltrate or evidence of pneumonia.     Final Clinical Impression(s) / ED Diagnoses Final diagnoses:  Influenza    Rx / DC Orders ED Discharge Orders     None        Wyvonnia Dusky, MD 08/18/21 1007

## 2021-09-02 ENCOUNTER — Encounter: Payer: Self-pay | Admitting: Gastroenterology

## 2021-11-06 ENCOUNTER — Emergency Department (HOSPITAL_COMMUNITY): Payer: BC Managed Care – PPO

## 2021-11-06 ENCOUNTER — Inpatient Hospital Stay (HOSPITAL_COMMUNITY)
Admission: EM | Admit: 2021-11-06 | Discharge: 2021-11-21 | DRG: 219 | Disposition: A | Discharge status: Rehabilitation Facility | Payer: BC Managed Care – PPO | Attending: Vascular Surgery | Admitting: Vascular Surgery

## 2021-11-06 ENCOUNTER — Encounter (HOSPITAL_COMMUNITY): Payer: Self-pay

## 2021-11-06 ENCOUNTER — Other Ambulatory Visit: Payer: Self-pay

## 2021-11-06 DIAGNOSIS — R4182 Altered mental status, unspecified: Secondary | ICD-10-CM | POA: Diagnosis not present

## 2021-11-06 DIAGNOSIS — Z8349 Family history of other endocrine, nutritional and metabolic diseases: Secondary | ICD-10-CM | POA: Diagnosis not present

## 2021-11-06 DIAGNOSIS — J440 Chronic obstructive pulmonary disease with acute lower respiratory infection: Secondary | ICD-10-CM | POA: Diagnosis not present

## 2021-11-06 DIAGNOSIS — I2510 Atherosclerotic heart disease of native coronary artery without angina pectoris: Secondary | ICD-10-CM | POA: Diagnosis not present

## 2021-11-06 DIAGNOSIS — N3281 Overactive bladder: Secondary | ICD-10-CM | POA: Diagnosis present

## 2021-11-06 DIAGNOSIS — E785 Hyperlipidemia, unspecified: Secondary | ICD-10-CM | POA: Diagnosis not present

## 2021-11-06 DIAGNOSIS — I6381 Other cerebral infarction due to occlusion or stenosis of small artery: Secondary | ICD-10-CM | POA: Diagnosis not present

## 2021-11-06 DIAGNOSIS — Z978 Presence of other specified devices: Secondary | ICD-10-CM

## 2021-11-06 DIAGNOSIS — T189XXA Foreign body of alimentary tract, part unspecified, initial encounter: Secondary | ICD-10-CM | POA: Diagnosis not present

## 2021-11-06 DIAGNOSIS — Z743 Need for continuous supervision: Secondary | ICD-10-CM | POA: Diagnosis not present

## 2021-11-06 DIAGNOSIS — N179 Acute kidney failure, unspecified: Secondary | ICD-10-CM | POA: Diagnosis not present

## 2021-11-06 DIAGNOSIS — D62 Acute posthemorrhagic anemia: Secondary | ICD-10-CM | POA: Diagnosis not present

## 2021-11-06 DIAGNOSIS — K219 Gastro-esophageal reflux disease without esophagitis: Secondary | ICD-10-CM | POA: Diagnosis not present

## 2021-11-06 DIAGNOSIS — I959 Hypotension, unspecified: Secondary | ICD-10-CM | POA: Diagnosis not present

## 2021-11-06 DIAGNOSIS — I10 Essential (primary) hypertension: Secondary | ICD-10-CM | POA: Diagnosis not present

## 2021-11-06 DIAGNOSIS — I7100 Dissection of unspecified site of aorta: Secondary | ICD-10-CM | POA: Diagnosis present

## 2021-11-06 DIAGNOSIS — E0590 Thyrotoxicosis, unspecified without thyrotoxic crisis or storm: Secondary | ICD-10-CM | POA: Diagnosis present

## 2021-11-06 DIAGNOSIS — Z4659 Encounter for fitting and adjustment of other gastrointestinal appliance and device: Secondary | ICD-10-CM

## 2021-11-06 DIAGNOSIS — R918 Other nonspecific abnormal finding of lung field: Secondary | ICD-10-CM | POA: Diagnosis present

## 2021-11-06 DIAGNOSIS — Z86010 Personal history of colonic polyps: Secondary | ICD-10-CM

## 2021-11-06 DIAGNOSIS — J189 Pneumonia, unspecified organism: Secondary | ICD-10-CM | POA: Diagnosis not present

## 2021-11-06 DIAGNOSIS — Z8673 Personal history of transient ischemic attack (TIA), and cerebral infarction without residual deficits: Secondary | ICD-10-CM

## 2021-11-06 DIAGNOSIS — R6889 Other general symptoms and signs: Secondary | ICD-10-CM | POA: Diagnosis not present

## 2021-11-06 DIAGNOSIS — Q600 Renal agenesis, unilateral: Secondary | ICD-10-CM | POA: Diagnosis not present

## 2021-11-06 DIAGNOSIS — I161 Hypertensive emergency: Secondary | ICD-10-CM | POA: Diagnosis present

## 2021-11-06 DIAGNOSIS — I7772 Dissection of iliac artery: Secondary | ICD-10-CM | POA: Diagnosis not present

## 2021-11-06 DIAGNOSIS — E876 Hypokalemia: Secondary | ICD-10-CM | POA: Diagnosis not present

## 2021-11-06 DIAGNOSIS — R079 Chest pain, unspecified: Secondary | ICD-10-CM | POA: Diagnosis not present

## 2021-11-06 DIAGNOSIS — Z8674 Personal history of sudden cardiac arrest: Secondary | ICD-10-CM | POA: Diagnosis not present

## 2021-11-06 DIAGNOSIS — F17210 Nicotine dependence, cigarettes, uncomplicated: Secondary | ICD-10-CM | POA: Diagnosis present

## 2021-11-06 DIAGNOSIS — Z8249 Family history of ischemic heart disease and other diseases of the circulatory system: Secondary | ICD-10-CM

## 2021-11-06 DIAGNOSIS — I71012 Dissection of descending thoracic aorta: Secondary | ICD-10-CM

## 2021-11-06 DIAGNOSIS — R059 Cough, unspecified: Secondary | ICD-10-CM | POA: Diagnosis not present

## 2021-11-06 DIAGNOSIS — Z83438 Family history of other disorder of lipoprotein metabolism and other lipidemia: Secondary | ICD-10-CM | POA: Diagnosis not present

## 2021-11-06 DIAGNOSIS — Z419 Encounter for procedure for purposes other than remedying health state, unspecified: Secondary | ICD-10-CM

## 2021-11-06 DIAGNOSIS — G8929 Other chronic pain: Secondary | ICD-10-CM | POA: Diagnosis present

## 2021-11-06 DIAGNOSIS — J9690 Respiratory failure, unspecified, unspecified whether with hypoxia or hypercapnia: Secondary | ICD-10-CM

## 2021-11-06 DIAGNOSIS — E0520 Thyrotoxicosis with toxic multinodular goiter without thyrotoxic crisis or storm: Secondary | ICD-10-CM | POA: Diagnosis present

## 2021-11-06 DIAGNOSIS — D696 Thrombocytopenia, unspecified: Secondary | ICD-10-CM | POA: Diagnosis not present

## 2021-11-06 DIAGNOSIS — Z9114 Patient's other noncompliance with medication regimen: Secondary | ICD-10-CM

## 2021-11-06 DIAGNOSIS — E669 Obesity, unspecified: Secondary | ICD-10-CM | POA: Diagnosis not present

## 2021-11-06 DIAGNOSIS — M7989 Other specified soft tissue disorders: Secondary | ICD-10-CM | POA: Diagnosis not present

## 2021-11-06 DIAGNOSIS — Z7902 Long term (current) use of antithrombotics/antiplatelets: Secondary | ICD-10-CM | POA: Diagnosis not present

## 2021-11-06 DIAGNOSIS — J449 Chronic obstructive pulmonary disease, unspecified: Secondary | ICD-10-CM | POA: Diagnosis present

## 2021-11-06 DIAGNOSIS — R52 Pain, unspecified: Secondary | ICD-10-CM

## 2021-11-06 DIAGNOSIS — I469 Cardiac arrest, cause unspecified: Secondary | ICD-10-CM | POA: Diagnosis not present

## 2021-11-06 DIAGNOSIS — I080 Rheumatic disorders of both mitral and aortic valves: Secondary | ICD-10-CM | POA: Diagnosis not present

## 2021-11-06 DIAGNOSIS — G40909 Epilepsy, unspecified, not intractable, without status epilepticus: Secondary | ICD-10-CM | POA: Diagnosis present

## 2021-11-06 DIAGNOSIS — N644 Mastodynia: Secondary | ICD-10-CM | POA: Diagnosis present

## 2021-11-06 DIAGNOSIS — Z823 Family history of stroke: Secondary | ICD-10-CM

## 2021-11-06 DIAGNOSIS — I771 Stricture of artery: Secondary | ICD-10-CM | POA: Diagnosis not present

## 2021-11-06 DIAGNOSIS — I252 Old myocardial infarction: Secondary | ICD-10-CM | POA: Diagnosis not present

## 2021-11-06 DIAGNOSIS — K828 Other specified diseases of gallbladder: Secondary | ICD-10-CM | POA: Diagnosis not present

## 2021-11-06 DIAGNOSIS — J9811 Atelectasis: Secondary | ICD-10-CM | POA: Diagnosis not present

## 2021-11-06 DIAGNOSIS — K567 Ileus, unspecified: Secondary | ICD-10-CM | POA: Diagnosis not present

## 2021-11-06 DIAGNOSIS — Z79899 Other long term (current) drug therapy: Secondary | ICD-10-CM

## 2021-11-06 DIAGNOSIS — K5730 Diverticulosis of large intestine without perforation or abscess without bleeding: Secondary | ICD-10-CM | POA: Diagnosis not present

## 2021-11-06 DIAGNOSIS — Z20822 Contact with and (suspected) exposure to covid-19: Secondary | ICD-10-CM | POA: Diagnosis present

## 2021-11-06 DIAGNOSIS — Z801 Family history of malignant neoplasm of trachea, bronchus and lung: Secondary | ICD-10-CM | POA: Diagnosis not present

## 2021-11-06 DIAGNOSIS — I7102 Dissection of abdominal aorta: Secondary | ICD-10-CM | POA: Diagnosis not present

## 2021-11-06 DIAGNOSIS — R5381 Other malaise: Secondary | ICD-10-CM | POA: Diagnosis not present

## 2021-11-06 DIAGNOSIS — J432 Centrilobular emphysema: Secondary | ICD-10-CM | POA: Diagnosis not present

## 2021-11-06 DIAGNOSIS — Z720 Tobacco use: Secondary | ICD-10-CM | POA: Diagnosis not present

## 2021-11-06 DIAGNOSIS — J90 Pleural effusion, not elsewhere classified: Secondary | ICD-10-CM | POA: Diagnosis not present

## 2021-11-06 DIAGNOSIS — Z8679 Personal history of other diseases of the circulatory system: Secondary | ICD-10-CM | POA: Diagnosis not present

## 2021-11-06 DIAGNOSIS — I7103 Dissection of thoracoabdominal aorta: Principal | ICD-10-CM

## 2021-11-06 DIAGNOSIS — M549 Dorsalgia, unspecified: Secondary | ICD-10-CM | POA: Diagnosis not present

## 2021-11-06 DIAGNOSIS — E039 Hypothyroidism, unspecified: Secondary | ICD-10-CM | POA: Diagnosis not present

## 2021-11-06 DIAGNOSIS — R0602 Shortness of breath: Secondary | ICD-10-CM | POA: Diagnosis not present

## 2021-11-06 DIAGNOSIS — Z4682 Encounter for fitting and adjustment of non-vascular catheter: Secondary | ICD-10-CM | POA: Diagnosis not present

## 2021-11-06 DIAGNOSIS — Q632 Ectopic kidney: Secondary | ICD-10-CM | POA: Diagnosis not present

## 2021-11-06 DIAGNOSIS — K7689 Other specified diseases of liver: Secondary | ICD-10-CM | POA: Diagnosis not present

## 2021-11-06 DIAGNOSIS — I700 Atherosclerosis of aorta: Secondary | ICD-10-CM | POA: Diagnosis not present

## 2021-11-06 DIAGNOSIS — I9751 Accidental puncture and laceration of a circulatory system organ or structure during a circulatory system procedure: Secondary | ICD-10-CM | POA: Diagnosis not present

## 2021-11-06 DIAGNOSIS — K5900 Constipation, unspecified: Secondary | ICD-10-CM | POA: Diagnosis not present

## 2021-11-06 DIAGNOSIS — M542 Cervicalgia: Secondary | ICD-10-CM | POA: Diagnosis present

## 2021-11-06 DIAGNOSIS — Z9911 Dependence on respirator [ventilator] status: Secondary | ICD-10-CM | POA: Diagnosis not present

## 2021-11-06 DIAGNOSIS — D72829 Elevated white blood cell count, unspecified: Secondary | ICD-10-CM | POA: Diagnosis not present

## 2021-11-06 LAB — LACTIC ACID, PLASMA
Lactic Acid, Venous: 1.3 mmol/L (ref 0.5–1.9)
Lactic Acid, Venous: 1.3 mmol/L (ref 0.5–1.9)

## 2021-11-06 LAB — CBC WITH DIFFERENTIAL/PLATELET
Abs Immature Granulocytes: 0.04 10*3/uL (ref 0.00–0.07)
Basophils Absolute: 0 10*3/uL (ref 0.0–0.1)
Basophils Relative: 0 %
Eosinophils Absolute: 0.2 10*3/uL (ref 0.0–0.5)
Eosinophils Relative: 2 %
HCT: 42.6 % (ref 36.0–46.0)
Hemoglobin: 14 g/dL (ref 12.0–15.0)
Immature Granulocytes: 0 %
Lymphocytes Relative: 20 %
Lymphs Abs: 2.5 10*3/uL (ref 0.7–4.0)
MCH: 34.5 pg — ABNORMAL HIGH (ref 26.0–34.0)
MCHC: 32.9 g/dL (ref 30.0–36.0)
MCV: 104.9 fL — ABNORMAL HIGH (ref 80.0–100.0)
Monocytes Absolute: 0.5 10*3/uL (ref 0.1–1.0)
Monocytes Relative: 4 %
Neutro Abs: 9.5 10*3/uL — ABNORMAL HIGH (ref 1.7–7.7)
Neutrophils Relative %: 74 %
Platelets: 261 10*3/uL (ref 150–400)
RBC: 4.06 MIL/uL (ref 3.87–5.11)
RDW: 11.8 % (ref 11.5–15.5)
WBC: 12.7 10*3/uL — ABNORMAL HIGH (ref 4.0–10.5)
nRBC: 0 % (ref 0.0–0.2)

## 2021-11-06 LAB — COMPREHENSIVE METABOLIC PANEL
ALT: 13 U/L (ref 0–44)
AST: 17 U/L (ref 15–41)
Albumin: 3.8 g/dL (ref 3.5–5.0)
Alkaline Phosphatase: 75 U/L (ref 38–126)
Anion gap: 12 (ref 5–15)
BUN: 12 mg/dL (ref 8–23)
CO2: 22 mmol/L (ref 22–32)
Calcium: 9.3 mg/dL (ref 8.9–10.3)
Chloride: 109 mmol/L (ref 98–111)
Creatinine, Ser: 0.6 mg/dL (ref 0.44–1.00)
GFR, Estimated: >60 mL/min (ref >60)
Glucose, Bld: 136 mg/dL — ABNORMAL HIGH (ref 70–99)
Potassium: 3.6 mmol/L (ref 3.5–5.1)
Sodium: 143 mmol/L (ref 135–145)
Total Bilirubin: 0.6 mg/dL (ref 0.3–1.2)
Total Protein: 7.3 g/dL (ref 6.5–8.1)

## 2021-11-06 LAB — CBG MONITORING, ED: Glucose-Capillary: 128 mg/dL — ABNORMAL HIGH (ref 70–99)

## 2021-11-06 LAB — D-DIMER, QUANTITATIVE: D-Dimer, Quant: 4.78 ug/mL-FEU — ABNORMAL HIGH (ref 0.00–0.50)

## 2021-11-06 LAB — TSH: TSH: 0.021 u[IU]/mL — ABNORMAL LOW (ref 0.350–4.500)

## 2021-11-06 LAB — MAGNESIUM: Magnesium: 1.8 mg/dL (ref 1.7–2.4)

## 2021-11-06 LAB — RESP PANEL BY RT-PCR (FLU A&B, COVID) ARPGX2
Influenza A by PCR: NEGATIVE
Influenza B by PCR: NEGATIVE
SARS Coronavirus 2 by RT PCR: NEGATIVE

## 2021-11-06 LAB — GLUCOSE, CAPILLARY: Glucose-Capillary: 130 mg/dL — ABNORMAL HIGH (ref 70–99)

## 2021-11-06 LAB — TROPONIN I (HIGH SENSITIVITY)
Troponin I (High Sensitivity): 5 ng/L (ref <18)
Troponin I (High Sensitivity): 4 ng/L (ref <18)

## 2021-11-06 LAB — MRSA NEXT GEN BY PCR, NASAL: MRSA by PCR Next Gen: NOT DETECTED

## 2021-11-06 LAB — ETHANOL: Alcohol, Ethyl (B): <10 mg/dL (ref <10)

## 2021-11-06 LAB — BRAIN NATRIURETIC PEPTIDE: B Natriuretic Peptide: 21 pg/mL (ref 0.0–100.0)

## 2021-11-06 LAB — PROTIME-INR
INR: 1 (ref 0.8–1.2)
Prothrombin Time: 12.7 seconds (ref 11.4–15.2)

## 2021-11-06 LAB — T4, FREE: Free T4: 1.12 ng/dL (ref 0.61–1.12)

## 2021-11-06 LAB — HIV ANTIBODY (ROUTINE TESTING W REFLEX): HIV Screen 4th Generation wRfx: NONREACTIVE

## 2021-11-06 MED ORDER — ACETAMINOPHEN 325 MG PO TABS
650.0000 mg | ORAL_TABLET | ORAL | Status: DC | PRN
  Administered 2021-11-19 – 2021-11-20 (×2): 650 mg via ORAL
  Filled 2021-11-06 (×2): qty 2

## 2021-11-06 MED ORDER — HYDROMORPHONE HCL 1 MG/ML IJ SOLN
1.0000 mg | Freq: Once | INTRAMUSCULAR | Status: AC
  Administered 2021-11-06: 1 mg via INTRAVENOUS
  Filled 2021-11-06: qty 1

## 2021-11-06 MED ORDER — PROCHLORPERAZINE EDISYLATE 10 MG/2ML IJ SOLN
10.0000 mg | Freq: Four times a day (QID) | INTRAMUSCULAR | Status: DC | PRN
  Administered 2021-11-06: 10 mg via INTRAVENOUS
  Filled 2021-11-06 (×2): qty 2

## 2021-11-06 MED ORDER — ATORVASTATIN CALCIUM 40 MG PO TABS
40.0000 mg | ORAL_TABLET | Freq: Every day | ORAL | Status: DC
  Administered 2021-11-07 – 2021-11-10 (×4): 40 mg via ORAL
  Filled 2021-11-06 (×4): qty 1

## 2021-11-06 MED ORDER — ESMOLOL HCL-SODIUM CHLORIDE 2000 MG/100ML IV SOLN
25.0000 ug/kg/min | INTRAVENOUS | Status: DC
  Administered 2021-11-06: 25 ug/kg/min via INTRAVENOUS
  Filled 2021-11-06: qty 100

## 2021-11-06 MED ORDER — POLYETHYLENE GLYCOL 3350 17 G PO PACK: 17.0000 g | PACK | Freq: Every day | ORAL | Status: DC | PRN

## 2021-11-06 MED ORDER — LABETALOL HCL 5 MG/ML IV SOLN
0.5000 mg/min | Status: DC
  Administered 2021-11-06: 0.5 mg/min via INTRAVENOUS
  Administered 2021-11-06: 2 mg/min via INTRAVENOUS
  Filled 2021-11-06 (×4): qty 80

## 2021-11-06 MED ORDER — HYDROMORPHONE HCL 1 MG/ML IJ SOLN
1.0000 mg | INTRAMUSCULAR | Status: DC | PRN
  Administered 2021-11-06 – 2021-11-07 (×2): 1 mg via INTRAVENOUS
  Filled 2021-11-06 (×3): qty 1

## 2021-11-06 MED ORDER — CHLORHEXIDINE GLUCONATE CLOTH 2 % EX PADS
6.0000 | MEDICATED_PAD | Freq: Every day | CUTANEOUS | Status: DC
  Administered 2021-11-07 – 2021-11-21 (×14): 6 via TOPICAL

## 2021-11-06 MED ORDER — LABETALOL HCL 5 MG/ML IV SOLN
0.5000 mg/min | Status: DC
  Administered 2021-11-06: 2 mg/min via INTRAVENOUS
  Filled 2021-11-06: qty 200

## 2021-11-06 MED ORDER — AMLODIPINE BESYLATE 5 MG PO TABS
5.0000 mg | ORAL_TABLET | Freq: Every day | ORAL | Status: DC
  Administered 2021-11-06: 5 mg via ORAL
  Filled 2021-11-06: qty 1

## 2021-11-06 MED ORDER — SODIUM CHLORIDE 0.9 % IV BOLUS
500.0000 mL | Freq: Once | INTRAVENOUS | Status: AC
  Administered 2021-11-06: 500 mL via INTRAVENOUS

## 2021-11-06 MED ORDER — ASPIRIN 81 MG PO CHEW
324.0000 mg | CHEWABLE_TABLET | Freq: Once | ORAL | Status: DC
  Filled 2021-11-06: qty 4

## 2021-11-06 MED ORDER — DICLOFENAC EPOLAMINE 1.3 % EX PTCH
1.0000 | MEDICATED_PATCH | Freq: Two times a day (BID) | CUTANEOUS | Status: DC
  Administered 2021-11-06: 1 via TRANSDERMAL
  Filled 2021-11-06 (×2): qty 1

## 2021-11-06 MED ORDER — ESMOLOL HCL-SODIUM CHLORIDE 2000 MG/100ML IV SOLN
25.0000 ug/kg/min | INTRAVENOUS | Status: DC
  Administered 2021-11-06: 75 ug/kg/min via INTRAVENOUS
  Filled 2021-11-06: qty 100

## 2021-11-06 MED ORDER — HYDROMORPHONE HCL 1 MG/ML IJ SOLN
0.5000 mg | INTRAMUSCULAR | Status: DC | PRN
  Administered 2021-11-06 (×2): 0.5 mg via INTRAVENOUS
  Filled 2021-11-06: qty 1
  Filled 2021-11-06: qty 0.5

## 2021-11-06 MED ORDER — MORPHINE SULFATE (PF) 4 MG/ML IV SOLN
4.0000 mg | Freq: Once | INTRAVENOUS | Status: AC
  Administered 2021-11-06: 4 mg via INTRAVENOUS
  Filled 2021-11-06: qty 1

## 2021-11-06 MED ORDER — IOHEXOL 350 MG/ML SOLN
100.0000 mL | Freq: Once | INTRAVENOUS | Status: AC | PRN
  Administered 2021-11-06: 100 mL via INTRAVENOUS

## 2021-11-06 MED ORDER — PANTOPRAZOLE SODIUM 40 MG PO TBEC
40.0000 mg | DELAYED_RELEASE_TABLET | Freq: Every day | ORAL | Status: DC
  Administered 2021-11-06: 40 mg via ORAL
  Filled 2021-11-06: qty 1

## 2021-11-06 MED ORDER — LOSARTAN POTASSIUM 50 MG PO TABS
25.0000 mg | ORAL_TABLET | Freq: Every day | ORAL | Status: DC
  Administered 2021-11-06: 25 mg via ORAL
  Filled 2021-11-06: qty 1

## 2021-11-06 MED ORDER — DOCUSATE SODIUM 100 MG PO CAPS: 100.0000 mg | ORAL_CAPSULE | Freq: Two times a day (BID) | ORAL | Status: DC | PRN

## 2021-11-06 MED ORDER — ONDANSETRON HCL 4 MG/2ML IJ SOLN
4.0000 mg | Freq: Four times a day (QID) | INTRAMUSCULAR | Status: DC | PRN
  Administered 2021-11-06: 4 mg via INTRAVENOUS
  Filled 2021-11-06: qty 2

## 2021-11-06 NOTE — Consult Note (Addendum)
Hospital Consult    Reason for Consult:  aortic dissection Requesting Physician:  Rollene Rotunda [facility] MRN #:  [id number]  History of Present Illness: This is a 71 y.o. female who presented to the hospital today with severe back and chest pain.  She states she thought it would get better but kept getting progressively worse.  She states that when it got worse, she did get short of breath.   She states that she did have some buttock pain and left leg numbness but this got better along with the pain once she was at the hospital and started on medication.   She was brought to the hospital by EMS.  She did have a CTA, which revealed a type B dissection that extends into the infrarenal aorta and terminates just beyond the bifurcation of the right CIA.    She does have hx of MI and CVA but did not know about the CVA until she had tests that revealed this.  She does smoke and has for her whole life.  She does not have hx of renal disease or pulmonary disease that she is aware of.   There is family hx of [place], renal disease on her mother's side.   The pt is on a statin for cholesterol management.  The pt is not on a daily aspirin.   Other AC:  none The pt is on CCB, ARB for hypertension.   The pt is not diabetic.   Tobacco hx:  current  Past Medical History:  Diagnosis Date   Chest pain    a. 01/2015 Echo: Nl LV fxn, Gr 1 DD, triv AI, mild MR.   Essential hypertension    Family history of lung cancer    Hepatic cyst    a. noted on CT 01/2015.   Hyperthyroidism    GOING TO [facility] FOR SECOND OPINION   Multinodular goiter    a. 01/2015 CT chest: multinodular goidter w/ substernal extension of the left lobe of the thyroid assoc w/ rightward deviation of tracheal air column.   Neck pain, chronic    Personal history of colonic polyps    Pulmonary nodules    a. 01/2015 CT Chest: RLL ~ 74mm subpleural nodule - rec f/u in 6-12 mos.   Splenic cyst    a. noted on CT 01/2015.    Past Surgical History:   Procedure Laterality Date   CERVICAL DISC ARTHROPLASTY N/A 07/05/2019   Procedure: Cervical Six-Seven Artificial disc replacement;  Surgeon: Kristeen Miss, MD;  Location: [facility];  Service: Neurosurgery;  Laterality: N/A;  Cervical Six-Seven Artificial disc replacement   CESAREAN SECTION     COLONOSCOPY  01/13/2020   TONSILLECTOMY     AROUND 5-6 YRS OLD   UPPER GASTROINTESTINAL ENDOSCOPY  01/13/2020    Allergies  Allergen Reactions   Shellfish Allergy Anaphylaxis    Prior to Admission medications   Medication Sig Start Date End Date Taking? Authorizing Provider  amLODipine (NORVASC) 5 MG tablet Take 1 tablet (5 mg total) by mouth at bedtime. 04/12/21  Yes Martinique, Betty G, MD  Aspirin-Acetaminophen (GOODYS BACK & BODY PAIN PO) Take 1 Package by mouth daily as needed (pain).   Yes [provider]  aspirin-acetaminophen-caffeine (EXCEDRIN MIGRAINE) [phone] MG tablet Take 2 tablets by mouth every 6 (six) hours as needed for headache.   Yes [provider]  clotrimazole-betamethasone (LOTRISONE) cream Apply to affected area 2 times daily prn 04/24/20  Yes Raylene Everts, MD  atorvastatin (LIPITOR) 40 MG  tablet Take 1 tablet by mouth once daily Patient not taking: Reported on 11/06/2021 04/18/21   Martinique, Betty G, MD  diclofenac Sodium (VOLTAREN) 1 % GEL Apply 4 g topically 4 (four) times daily. Patient not taking: Reported on 11/06/2021 04/28/21   Deno Etienne, DO  losartan (COZAAR) 25 MG tablet Take 1 tablet (25 mg total) by mouth daily. Patient not taking: Reported on 11/06/2021 04/12/21   Martinique, Betty G, MD  [facility] Xxx 25 MG TB24 tablet Take 1 tablet by mouth once daily Patient not taking: Reported on 11/06/2021 08/12/21   Martinique, Betty G, MD  Tiotropium Bromide Monohydrate (SPIRIVA RESPIMAT) 2.5 MCG/ACT AERS Inhale 2 puffs into the lungs daily. Patient not taking: Reported on 11/06/2021 06/08/20   Deneise Lever, MD    Social History   Socioeconomic History   Marital  status: Single    Spouse name: Not on file   Number of children: Not on file   Years of education: Not on file   Highest education level: Not on file  Occupational History   Occupation: retired  Tobacco Use   Smoking status: Light Smoker    Types: Cigarettes   Smokeless tobacco: Never   Tobacco comments:    1-2 NOT EVERY DAY   Vaping Use   Vaping Use: Never used  Substance and Sexual Activity   Alcohol use: Yes    Alcohol/week: 0.0 standard drinks    Comment: rare   Drug use: No   Sexual activity: Not Currently  Other Topics Concern   Not on file  Social History Narrative   Not on file   Social Determinants of Health   Financial Resource Strain: Not on file  Food Insecurity: Not on file  Transportation Needs: Not on file  Physical Activity: Not on file  Stress: Not on file  Social Connections: Not on file  Intimate Partner Violence: Not on file    Family History  Problem Relation Age of Onset   Heart attack Maternal Grandmother        deceased   High blood pressure Mother    High Cholesterol Mother    Thyroid disease Mother    Thyroid disease Sister    Lung cancer Father    Cancer Maternal Uncle        unk type   Cancer Sister        unk type   Colon cancer Neg Hx    Colon polyps Neg Hx    Esophageal cancer Neg Hx    Rectal cancer Neg Hx    Stomach cancer Neg Hx     ROS: [x]  Positive   [ ]  Negative   [ ]  All sytems reviewed and are negative  Cardiac: [x]  chest pain/pressure []  palpitations []  SOB lying flat []  DOE  Vascular: []  pain in legs while walking []  pain in legs at rest []  pain in legs at night []  non-healing ulcers []  hx of DVT []  swelling in legs  Pulmonary: []  asthma/wheezing []  home O2  Neurologic: []  hx of CVA []  mini stroke   Hematologic: []  hx of cancer  Endocrine:   []  diabetes [x]  thyroid disease  GI []  GERD  GU: []  CKD/renal failure []  HD--[]  M/W/F or []  T/T/S  Psychiatric: []  anxiety []   depression  Musculoskeletal: []  arthritis []  joint pain  Integumentary: []  rashes []  ulcers  Constitutional: []  fever  []  chills  Physical Examination  Vitals:   11/06/21 1445 11/06/21 1500  BP: (!) 157/85 132/77  Pulse: 76 72  Resp:  16  Temp:    SpO2: 95% 99%   Body mass index is 28.34 kg/m.  General:  WDWN appears uncomfortable at times Gait: Not observed HENT: WNL, normocephalic Pulmonary: normal non-labored breathing Cardiac: regular Abdomen:  soft, NT/ND; aortic pulse is not palpable Skin: without rashes Vascular Exam/Pulses:  Right Left  Radial 2+ (normal) 2+ (normal)  Femoral 2+ (normal) 2+ (normal)  DP Unable to palpate Unable to palpate   Extremities: without ischemic changes, without Gangrene , without cellulitis; without open wounds Musculoskeletal: no muscle wasting or atrophy  Neurologic: A&O X 3; speech is fluent/normal Psychiatric:  The pt has Normal affect.   CBC    Component Value Date/Time   WBC 12.7 (H) 11/06/2021 0959   RBC 4.06 11/06/2021 0959   HGB 14.0 11/06/2021 0959   HCT 42.6 11/06/2021 0959   PLT 261 11/06/2021 0959   MCV 104.9 (H) 11/06/2021 0959   MCH 34.5 (H) 11/06/2021 0959   MCHC 32.9 11/06/2021 0959   RDW 11.8 11/06/2021 0959   LYMPHSABS 2.5 11/06/2021 0959   MONOABS 0.5 11/06/2021 0959   EOSABS 0.2 11/06/2021 0959   BASOSABS 0.0 11/06/2021 0959    BMET    Component Value Date/Time   NA 143 11/06/2021 0959   K 3.6 11/06/2021 0959   CL 109 11/06/2021 0959   CO2 22 11/06/2021 0959   GLUCOSE 136 (H) 11/06/2021 0959   BUN 12 11/06/2021 0959   CREATININE 0.60 11/06/2021 0959   CALCIUM 9.3 11/06/2021 0959   GFRNONAA >60 11/06/2021 0959   GFRAA >60 01/14/2020 1124    COAGS: Lab Results  Component Value Date   INR 1.0 11/06/2021   INR 0.8 04/10/2021   INR 0.90 08/16/2018     Non-Invasive Vascular Imaging:   CTA c/a/p 11/06/2021: IMPRESSION: 1. Stanford type B dissection (DeBakey type 3b dissection)  extends into the infrarenal abdominal aorta and right common iliac artery and terminates just beyond the bifurcation of the right common iliac artery. 2. Intramural hematoma is identified within the distal arch and descending thoracic aorta measuring 7 mm in thickness. 3. The inferior mesenteric artery appears to arise off the false lumen. There is contrast opacification of the IMA however. No signs of bowel ischemia at this time. 4. No evidence for solid organ ischemia within the abdomen or pelvis. 5. Aortic Atherosclerosis   ASSESSMENT/PLAN: This is a 71 y.o. female brought to the hospital with type B aortic dissection  -continue tight blood pressure control.  Pt's symptoms improved with better control. -Dr. Stanford Breed discussed with pt and daughter that she will be admitted to the ICU for BP control.  May need endovascular repair in a couple of weeks but will repeat scan in the next week.     Leontine Locket, PA-C [facility] [phone]  VASCULAR STAFF ADDENDUM: I have independently interviewed and examined the patient. I agree with the above.  71 year old female presents to [facility] ER today for evaluation of severe mid scapular back pain radiating to the anterior chest.  This began at about 8:00 this morning.  Patient has never had symptoms like this before.  Pain is associated with some numbness in the left leg and discomfort in the pelvis.  No weakness or sensory loss in the lower extremities otherwise.  No abdominal pain.  BP 132/77    Pulse 72    Temp 98.2 F (36.8 C) (Oral)    Resp 16    Ht  5\' 1"  (1.549 m)    Wt 68 kg    SpO2 99%    BMI 28.34 kg/m  Appears uncomfortable.  Repositioning frequently to get comfortable. Regular rate and rhythm Unlabored breathing Soft, nontender abdomen. 2+ radial pulses bilaterally; 2+ femoral pulses bilaterally.  I cannot appreciate any dorsalis pedis pulses.  CT angiogram reviewed in detail.  Type B aortic dissection  originating just distal to takeoff of left clavian artery extending through the perivisceral segment and infrarenal aorta into the left iliac artery.  A dynamic obstruction may be occurring in the origin of the right internal iliac artery.  There is clear inline flow into the proximal right upper extremity.  71 year old female with type B aortic dissection. Recommend medical therapy for now. Keep systolic blood pressure less than 120; at rate less than 60. Recommended agents to use, in order: Esmolol, Cleviprex, nitroprusside. Will try to avoid urgent TEVAR if at all possible. No evidence of malperfusion at the moment. She may be a candidate for early repair (I.e. in 2 weeks as an outpatient)  [name]. Stanford Breed, MD [facility] Phone Number: [phone] 11/06/2021 3:44 PM

## 2021-11-06 NOTE — ED Provider Notes (Signed)
[facility] EMERGENCY DEPARTMENT Provider Note   CSN: [id number] Arrival date & time: 11/06/21  0900     History  Chief Complaint  Patient presents with   GENERALIZED PAIN    DARON BREEDING is a 71 y.o. female.  HPI Patient presents with acute onset pain in the upper thoracic region radiating circumferentially to her chest.  Onset was less than 1 hour ago, she presents via EMS.  She does have history of multiple medical issues including hypertension, cardiac disease, but notes that she was well prior to today.  She has not been taking her medication regularly, per her admission.  Per EMS initial call was for stroke symptoms, the patient did eventually describe left leg numbness, but does not have this currently.    Home Medications Prior to Admission medications   Medication Sig Start Date End Date Taking? Authorizing Provider  amLODipine (NORVASC) 5 MG tablet Take 1 tablet (5 mg total) by mouth at bedtime. 04/12/21   [place], Betty G, MD  atorvastatin (LIPITOR) 40 MG tablet Take 1 tablet by mouth once daily 04/18/21   [place], Betty G, MD  carbamazepine (TEGRETOL) 100 MG chewable tablet Chew by mouth 4 (four) times daily. 03/22/20   [provider]  clotrimazole-betamethasone (LOTRISONE) cream Apply to affected area 2 times daily prn 04/24/20   Raylene Everts, MD  diazepam (VALIUM) 10 MG tablet  03/22/20   [provider]  diclofenac Sodium (VOLTAREN) 1 % GEL Apply 4 g topically 4 (four) times daily. 04/28/21   Deno Etienne, DO  EPITOL 200 MG tablet Take 200 mg by mouth 3 (three) times daily. 01/18/20   [provider]  fluconazole (DIFLUCAN) 150 MG tablet Take 1 tablet (150 mg total) by mouth daily. Repeat in 1 week if needed 04/24/20   Raylene Everts, MD  losartan (COZAAR) 25 MG tablet Take 1 tablet (25 mg total) by mouth daily. 04/12/21   [place], Betty G, MD  MYRBETRIQ 25 MG TB24 tablet Take 1 tablet by mouth once daily 08/12/21   [place],  Betty G, MD  omeprazole (PRILOSEC) 20 MG capsule Take 20 mg BID for 14 days, then take daily for 14 days then stop 01/18/20   Armbruster, Carlota Raspberry, MD  Tiotropium Bromide Monohydrate (SPIRIVA RESPIMAT) 2.5 MCG/ACT AERS Inhale 2 puffs into the lungs daily. 06/08/20   Deneise Lever, MD  traMADol (ULTRAM) 50 MG tablet Take 1 tablet (50 mg total) by mouth every 6 (six) hours as needed. 05/11/21   Lennice Sites, DO      Allergies    Patient has no known allergies.    Review of Systems   Review of Systems  Constitutional:        Per HPI, otherwise negative  HENT:         Per HPI, otherwise negative  Respiratory:         Per HPI, otherwise negative  Cardiovascular:        Per HPI, otherwise negative  Gastrointestinal:  Negative for vomiting.  Endocrine:       Negative aside from HPI  Genitourinary:        Neg aside from HPI   Musculoskeletal:        Per HPI, otherwise negative  Skin: Negative.   Neurological:  Negative for syncope.   Physical Exam Updated Vital Signs BP (!) 165/92    Pulse 89    Temp 98.2 F (36.8 C) (Oral)    Resp 15  Ht 5\' 1"  (1.549 m)    Wt 68 kg    SpO2 94%    BMI 28.34 kg/m  Physical Exam Vitals and nursing note reviewed.  Constitutional:      General: She is not in acute distress.    Appearance: She is well-developed. She is diaphoretic. She is not ill-appearing.  HENT:     Head: Normocephalic and atraumatic.  Eyes:     Conjunctiva/sclera: Conjunctivae normal.  Cardiovascular:     Rate and Rhythm: Normal rate and regular rhythm.  Pulmonary:     Effort: Pulmonary effort is normal. No respiratory distress.     Breath sounds: Normal breath sounds. No stridor.  Abdominal:     General: There is no distension.  Musculoskeletal:       Arms:  Skin:    General: Skin is warm.  Neurological:     Mental Status: She is alert and oriented to person, place, and time.     Cranial Nerves: No cranial nerve deficit.     Motor: No weakness.     Coordination:  Coordination normal.     Comments: Patient moves all extremities spontaneously, is able to climb into the bed without assistance, speaking clearly, has no facial asymmetry.    ED Results / Procedures / Treatments   Labs (all labs ordered are listed, but only abnormal results are displayed) Labs Reviewed  COMPREHENSIVE METABOLIC PANEL - Abnormal; Notable for the following components:      Result Value   Glucose, Bld 136 (*)    All other components within normal limits  CBC WITH DIFFERENTIAL/PLATELET - Abnormal; Notable for the following components:   WBC 12.7 (*)    MCV 104.9 (*)    MCH 34.5 (*)    Neutro Abs 9.5 (*)    All other components within normal limits  D-DIMER, QUANTITATIVE - Abnormal; Notable for the following components:   D-Dimer, Quant 4.78 (*)    All other components within normal limits  CBG MONITORING, ED - Abnormal; Notable for the following components:   Glucose-Capillary 128 (*)    All other components within normal limits  RESP PANEL BY RT-PCR (FLU A&B, COVID) ARPGX2  MAGNESIUM  BRAIN NATRIURETIC PEPTIDE  PROTIME-INR  TSH  LACTIC ACID, PLASMA  LACTIC ACID, PLASMA  TROPONIN I (HIGH SENSITIVITY)  TROPONIN I (HIGH SENSITIVITY)    EKG Sinus rhythm, rate 82, unremarkable  Radiology DG Chest Portable 1 View  Result Date: 11/06/2021 CLINICAL DATA:  Chest pain EXAM: PORTABLE CHEST 1 VIEW COMPARISON:  Chest x-ray dated August 17, 2021; chest CT dated August 31, 2020 FINDINGS: Cardiac and mediastinal contours are unchanged. Rightward deviation of the trachea is unchanged compared to multiple priors and due to large thyroid goiter which extends into the upper mediastinum when compared with prior chest CT. Lungs are clear. No large pleural effusion. IMPRESSION: 1. No active disease. 2. Unchanged rightward deviation of the trachea which is due to a large thyroid goiter. Electronically Signed   By: Yetta Glassman M.D.   On: 11/06/2021 09:31   CT Angio  Chest/Abd/Pel for Dissection W and/or Wo Contrast  Result Date: 11/06/2021 CLINICAL DATA:  Evaluate for aortic dissection. EXAM: CT ANGIOGRAPHY CHEST, ABDOMEN AND PELVIS TECHNIQUE: Non-contrast CT of the chest was initially obtained. Multidetector CT imaging through the chest, abdomen and pelvis was performed using the standard protocol during bolus administration of intravenous contrast. Multiplanar reconstructed images and MIPs were obtained and reviewed to evaluate the vascular anatomy. RADIATION DOSE REDUCTION:  This exam was performed according to the departmental dose-optimization program which includes automated exposure control, adjustment of the mA and/or kV according to patient size and/or use of iterative reconstruction technique. 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 The dissection flap originates at the origin of the left subclavian artery and extends into the infrarenal abdominal aorta and right common iliac artery and terminates just beyond the bifurcation, image 196/5. The vessels originating off the arch are supplied by the true lumen. There is contrast identified within both the true and false lumen with mixing of contrast noted in the false lumen proximally. Small intramural hematoma involving the aortic arch and descending thoracic aorta is also noted which measures 7 mm, image 23/6. The heart size appears normal. The thoracic aorta has a normal caliber. The ascending aorta measures 3 cm, image 56/9. The transverse aortic arch measures 2.9 cm. Aortic atherosclerotic calcifications and coronary artery calcifications noted. The main pulmonary artery and its proximal branches appear patent. Mediastinum/Nodes: Enlarged and heterogeneous thyroid gland with substernal extension into the mediastinum is identified. This has been evaluated on  previous imaging. (ref: J Am Coll Radiol. 2015 Feb;12(2): 143-50).Trachea is deviated right of the midline by the goiter. Insert thyroid no enlarged lymph nodes. Lungs/Pleura: Mild paraseptal emphysema. No pleural effusion, airspace consolidation or pneumothorax. Stable 5 mm perifissural nodule adjacent to the minor fissure, image 76/7. Musculoskeletal: Degenerative disc disease noted within the thoracic spine. No acute or suspicious osseous findings. Review of the MIP images confirms the above findings. CTA ABDOMEN AND PELVIS FINDINGS VASCULAR Aorta: Aortic dissection extends below the renal arteries into the right common iliac artery and terminates just beyond the bifurcation. Celiac: The celiac artery arises off the true lumen. No evidence of aneurysm, dissection or stenosis. SMA: The celiac artery arises off the true lumen. Patent without evidence of aneurysm, dissection, vasculitis or significant stenosis. Renals: Both renal arteries arise off the true lumen. Both renal arteries are patent without evidence of aneurysm, dissection, vasculitis, fibromuscular dysplasia or significant stenosis. IMA: The IMA appears to arise off the false lumen, image 165/5 and image 70/4. The vessel appears patent without evidence for dissection, vasculitis or stenosis. Inflow: See above Veins: No obvious venous abnormality within the limitations of this arterial phase study. Review of the MIP images confirms the above findings. NON-VASCULAR Hepatobiliary: Scattered liver cysts are again noted in appears similar to previous exam. Gallbladder appears normal. No bile duct dilatation. Pancreas: Unremarkable. No pancreatic ductal dilatation or surrounding inflammatory changes. Spleen: Normal in size without focal abnormality. Adrenals/Urinary Tract: Normal adrenal glands. The kidneys appear scratch set normal and symmetric enhancement of both kidneys. Several right kidney cysts are identified. The largest arises off the lateral cortex  of the inferior pole measuring 1.8 cm, image 162/5. No signs of nephrolithiasis or hydronephrosis. Bladder is unremarkable. Stomach/Bowel: Stomach is normal. No bowel wall thickening, inflammation, or distension. Distal colonic diverticula noted without signs of acute inflammation Lymphatic: No abdominopelvic adenopathy. Reproductive: Uterus and bilateral adnexa are unremarkable. Other: No ascites or focal fluid collections within the abdomen or pelvis. Musculoskeletal: No acute or significant osseous findings. Review of the MIP images confirms the above findings. IMPRESSION: 1. 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 2. Intramural  hematoma is identified within the distal arch and descending thoracic aorta measuring 7 mm in thickness. 3. The inferior mesenteric artery appears to arise off the false lumen. There is contrast opacification of the IMA however. No signs of bowel ischemia at this time. 4. No evidence for solid organ ischemia within the abdomen or pelvis. 5. Aortic Atherosclerosis (ICD10-I70.0) and Emphysema (ICD10-J43.9). Critical Value/emergent results were called by telephone at the time of interpretation on 11/06/2021 at 12:44 pm to provider Hailey Page PA-C , who verbally acknowledged these results. Electronically Signed   By: Kerby Moors M.D.   On: 11/06/2021 12:46    Procedures Procedures    Medications Ordered in ED Medications  aspirin chewable tablet 324 mg (324 mg Oral Not Given 11/06/21 0912)  diclofenac (FLECTOR) 1.3 % 1 patch (1 patch Transdermal Patch Applied 11/06/21 0956)  esmolol (BREVIBLOC) 2000 mg / 100 mL (20 mg/mL) infusion (25 mcg/kg/min  68 kg Intravenous New Bag/Given 11/06/21 1249)  morphine 4 MG/ML injection 4 mg (4 mg Intravenous Given 11/06/21 0958)  HYDROmorphone (DILAUDID) injection 1 mg (1 mg  Intravenous Given 11/06/21 1053)  HYDROmorphone (DILAUDID) injection 1 mg (1 mg Intravenous Given 11/06/21 1139)  iohexol (OMNIPAQUE) 350 MG/ML injection 100 mL (100 mLs Intravenous Contrast Given 11/06/21 1210)  HYDROmorphone (DILAUDID) injection 1 mg (1 mg Intravenous Given 11/06/21 1250)    ED Course/ Medical Decision Making/ A&P Cardiac 80s sinus normal Pulse ox 99% room air normal  Update:, Patient has received morphine, Dilaudid, now somewhat more comfortable.  Initial labs somewhat reassuring.  Update: After patient had CT scan, I interpreted the images, and I discussed them with her vascular surgery team, and her critical care team.  Aware of all findings as well.  My interpretation, consistent with the radiologist, notable for dissection of the aorta just inferior to the arch, type III, extending into the right common iliac artery with likely flow to the inferior mesenteric artery from false lumen. On exam after this was shared with the patient she is still hypertensive, will require initiation of esmolol continuous infusion for goal systolic less than 425 or heart rate less than 60.  Plan is for likely surgical repair after patient has been hemodynamically stabilized. Patient will require admission to the critical care team given critical abnormality of her aortic dissection  MDM Number of Diagnoses or Management Options   Amount and/or Complexity of Data Reviewed Clinical lab tests: reviewed and ordered Tests in the radiology section of CPT: ordered and reviewed Tests in the medicine section of CPT: ordered and reviewed Discussion of test results with the performing providers: yes Decide to obtain previous medical records or to obtain history from someone other than the patient: yes Obtain history from someone other than the patient: yes Review and summarize past medical records: yes Discuss the patient with other providers: yes Independent visualization of images, tracings, or  specimens: yes  Risk of Complications, Morbidity, and/or Mortality Presenting problems: high Diagnostic procedures: high Management options: high  Critical Care Total time providing critical care: 30-74 minutes (35)  Patient Progress Patient progress: stable           Final Clinical Impression(s) / ED Diagnoses Final diagnoses:  Dissection of thoracoabdominal aorta ([place])      Carmin Muskrat, MD 11/06/21 1320

## 2021-11-06 NOTE — ED Notes (Signed)
Patient screaming to the top of her lungs, patient refuses to lie still to get blood work, angry says RN has a bad attitude.  Explained I am trying to get things done so she can get pain medication and she kciked me out of the room and demanded another RN.  Informed patient she can't chose her nurse and all im trying to do is get things the MD ordered so we can get her pain medication.  Patient refused to let me do anything.  Security called to bedside and MD at bedside currently.

## 2021-11-06 NOTE — ED Triage Notes (Signed)
BIB EMS initially called out for stroke symtptoms but none noted then complains of chest pain and back pain and now complaiing of left hip pain with her leg being numb.  Patient screaming during triage stating "give mesomethiing to knock me out and if not I could have stayed at home in pain."

## 2021-11-06 NOTE — Progress Notes (Signed)
Pt with persistent nausea and vomiting. Added compazine for symptom relief.   Pt also with chest pain, dilaudid 0.5mg  only provides temporary relief. Increased dose to 1mg  IV q3h PRN.  Will monitor response.

## 2021-11-06 NOTE — H&P (Addendum)
NAME:  Darlene Griffith, MRN:  [id number], DOB:  Jul 17, 1951, LOS: 0 ADMISSION DATE:  11/06/2021, CONSULTATION DATE:  1/18 REFERRING MD:  Dr. Vanita Panda, CHIEF COMPLAINT:  Aortic dissection   History of Present Illness:  Patient is a 71 yo F w/ pertinent PMH of HTN, HLD, Hyperthyroidism, multinodular goiter present to [facility] ED on 1/18 with chest pain.  On 1/18, patient states she had acute onset chest pain  in upper back that radiates to her chest. EMS called. She states she has not been taking her home BP meds regularly. Arrived to [facility] ED. BP initially 136/76. Complaining of chest pain. EKG w/ sinus rhythm. Troponin and BNP WNL. CTA shows type B aortic dissection that extends into the infrarenal abdominal aorta and right common iliac artery. D-dimer 4.78. Started on Esmolol drip. Vascular surgery consulted.  PCCM consulted for ICU admission.  Pertinent  Medical History   Past Medical History:  Diagnosis Date   Chest pain    a. 01/2015 Echo: Nl LV fxn, Gr 1 DD, triv AI, mild MR.   Essential hypertension    Family history of lung cancer    Hepatic cyst    a. noted on CT 01/2015.   Hyperthyroidism    GOING TO [facility] FOR SECOND OPINION   Multinodular goiter    a. 01/2015 CT chest: multinodular goidter w/ substernal extension of the left lobe of the thyroid assoc w/ rightward deviation of tracheal air column.   Neck pain, chronic    Personal history of colonic polyps    Pulmonary nodules    a. 01/2015 CT Chest: RLL ~ 103mm subpleural nodule - rec f/u in 6-12 mos.   Splenic cyst    a. noted on CT 01/2015.     Significant Hospital Events: Including procedures, antibiotic start and stop dates in addition to other pertinent events   1/18: admitted to [facility]; aortic dissection; started on esmolol drip  Interim History / Subjective:  Patient awake and oriented. Chest pain improved after receiving morphine SBP 170s on esmolol drip  Objective   Blood pressure (!) 156/77, pulse 86, temperature 98.2 F  (36.8 C), temperature source Oral, resp. rate 20, height 5\' 1"  (1.549 m), weight 68 kg, SpO2 93 %.       No intake or output data in the 24 hours ending 11/06/21 1246 Filed Weights   11/06/21 0911  Weight: 68 kg    Examination: General: ill appearing female HEENT: MM pink/moist Neuro: Aox3; MAE CV: s1s2, RRR, no m/r/g PULM:  dim clear BS bilaterally; on room air GI: soft, bsx4 active  Extremities: warm/dry, no edema  Skin: no rashes or lesions appreciated  1/18 CTA chest 1. 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 2. Intramural hematoma is identified within the distal arch and descending thoracic aorta measuring 7 mm in thickness.   Resolved Hospital Problem list     Assessment & Plan:  Aortic Dissection: type B; extends into the infrarenal abdominal aorta and right common iliac artery Hx of HTN, HLD Hx of tobacco abuse P: -vascular surgery consulted -will admit to ICU for continuous telemetry monitoring -continue esmolol for HR goal <60 and SBP <120 -hold home BP meds for now -prn dilaudid for pain -smoking cessation counseling given -patient defers nicotine patch for now -UDS, ethanol, UA pending -continue home statin  Leukocytosis: reactive likely due  to above P: -trend lactic acid -no fever -trend wbc/fever curve -consider checking cultures and procalcitonin if wbc/fever trending up  GERD P: -PPI  Hx of Hyperthyroidism and multinodular goiter P: -check tsh -no home meds listed  Hx of pulmonary nodules: 1/18 CT chest 5 mm nodule; same size as CT 2016 P: -further follow up outpatient  Best Practice (right click and "Reselect all SmartList Selections" daily)   Diet/type: NPO w/ oral meds DVT prophylaxis: SCD GI prophylaxis: PPI Lines: N/A Foley:  N/A Code Status:  full code Last  date of multidisciplinary goals of care discussion [1/18 spoke with patient at bedside and patient's daughter over phone)  Labs   CBC: Recent Labs  Lab 11/06/21 0959  WBC 12.7*  NEUTROABS 9.5*  HGB 14.0  HCT 42.6  MCV 104.9*  PLT 081    Basic Metabolic Panel: Recent Labs  Lab 11/06/21 0959  NA 143  K 3.6  CL 109  CO2 22  GLUCOSE 136*  BUN 12  CREATININE 0.60  CALCIUM 9.3  MG 1.8   GFR: Estimated Creatinine Clearance: 57.7 mL/min (by C-G formula based on SCr of 0.6 mg/dL). Recent Labs  Lab 11/06/21 0959  WBC 12.7*    Liver Function Tests: Recent Labs  Lab 11/06/21 0959  AST 17  ALT 13  ALKPHOS 75  BILITOT 0.6  PROT 7.3  ALBUMIN 3.8   No results for input(s): LIPASE, AMYLASE in the last 168 hours. No results for input(s): AMMONIA in the last 168 hours.  ABG    Component Value Date/Time   TCO2 28 08/16/2018 1035     Coagulation Profile: Recent Labs  Lab 11/06/21 0959  INR 1.0    Cardiac Enzymes: No results for input(s): CKTOTAL, CKMB, CKMBINDEX, TROPONINI in the last 168 hours.  HbA1C: Hgb A1c MFr Bld  Date/Time Value Ref Range Status  05/21/2019 11:41 AM 5.0 4.8 - 5.6 % Final    Comment:    (NOTE) Pre diabetes:          5.7%-6.4% Diabetes:              >6.4% Glycemic control for   <7.0% adults with diabetes   02/17/2015 05:01 AM 5.5 4.8 - 5.6 % Final    Comment:    (NOTE)         Pre-diabetes: 5.7 - 6.4         Diabetes: >6.4         Glycemic control for adults with diabetes: <7.0     CBG: Recent Labs  Lab 11/06/21 0957  GLUCAP 128*    Review of Systems:   Review of Systems  Constitutional:  Negative for chills and fever.  HENT:  Negative for congestion.   Eyes:  Negative for blurred vision.  Respiratory:  Negative for cough, shortness of breath and wheezing.   Cardiovascular:  Positive for chest pain. Negative for palpitations and leg swelling.  Gastrointestinal:  Negative for constipation, diarrhea, nausea and  vomiting.  Musculoskeletal:  Positive for back pain.    Past Medical History:  She,  has a past medical history of Chest pain, Essential hypertension, Family history of lung cancer, Hepatic cyst, Hyperthyroidism, Multinodular goiter, Neck pain, chronic, Personal history of colonic polyps, Pulmonary nodules, and Splenic cyst.   Surgical History:   Past Surgical History:  Procedure Laterality Date   CERVICAL DISC ARTHROPLASTY N/A 07/05/2019   Procedure: Cervical Six-Seven Artificial disc replacement;  Surgeon: Kristeen Miss, MD;  Location: [facility];  Service:  Neurosurgery;  Laterality: N/A;  Cervical Six-Seven Artificial disc replacement   CESAREAN SECTION     COLONOSCOPY  01/13/2020   TONSILLECTOMY     AROUND 5-6 YRS OLD   UPPER GASTROINTESTINAL ENDOSCOPY  01/13/2020     Social History:   reports that she has been smoking cigarettes. She has never used smokeless tobacco. She reports current alcohol use. She reports that she does not use drugs.   Family History:  Her family history includes Cancer in her maternal uncle and sister; Heart attack in her maternal grandmother; High Cholesterol in her mother; High blood pressure in her mother; Lung cancer in her father; Thyroid disease in her mother and sister. There is no history of Colon cancer, Colon polyps, Esophageal cancer, Rectal cancer, or Stomach cancer.   Allergies No Known Allergies   Home Medications  Prior to Admission medications   Medication Sig Start Date End Date Taking? Authorizing Provider  amLODipine (NORVASC) 5 MG tablet Take 1 tablet (5 mg total) by mouth at bedtime. 04/12/21   [place], Betty G, MD  atorvastatin (LIPITOR) 40 MG tablet Take 1 tablet by mouth once daily 04/18/21   [place], Betty G, MD  carbamazepine (TEGRETOL) 100 MG chewable tablet Chew by mouth 4 (four) times daily. 03/22/20   [provider]  clotrimazole-betamethasone (LOTRISONE) cream Apply to affected area 2 times daily prn 04/24/20   Raylene Everts, MD  diazepam (VALIUM) 10 MG tablet  03/22/20   [provider]  diclofenac Sodium (VOLTAREN) 1 % GEL Apply 4 g topically 4 (four) times daily. 04/28/21   Deno Etienne, DO  EPITOL 200 MG tablet Take 200 mg by mouth 3 (three) times daily. 01/18/20   [provider]  fluconazole (DIFLUCAN) 150 MG tablet Take 1 tablet (150 mg total) by mouth daily. Repeat in 1 week if needed 04/24/20   Raylene Everts, MD  losartan (COZAAR) 25 MG tablet Take 1 tablet (25 mg total) by mouth daily. 04/12/21   [place], Betty G, MD  MYRBETRIQ 25 MG TB24 tablet Take 1 tablet by mouth once daily 08/12/21   [place], Betty G, MD  omeprazole (PRILOSEC) 20 MG capsule Take 20 mg BID for 14 days, then take daily for 14 days then stop 01/18/20   Armbruster, Carlota Raspberry, MD  Tiotropium Bromide Monohydrate (SPIRIVA RESPIMAT) 2.5 MCG/ACT AERS Inhale 2 puffs into the lungs daily. 06/08/20   Deneise Lever, MD  traMADol (ULTRAM) 50 MG tablet Take 1 tablet (50 mg total) by mouth every 6 (six) hours as needed. 05/11/21   Lennice Sites, DO     Critical care time: 45 minutes     JD Rexene Agent [facility] 11/06/2021, 12:46 PM  Please see [email or web address] for pager details.  From 7A-7P if no response, please call [phone]. After hours, please call ELink [phone].

## 2021-11-07 DIAGNOSIS — N179 Acute kidney failure, unspecified: Secondary | ICD-10-CM | POA: Diagnosis present

## 2021-11-07 LAB — BASIC METABOLIC PANEL
Anion gap: 9 (ref 5–15)
BUN: 23 mg/dL (ref 8–23)
CO2: 22 mmol/L (ref 22–32)
Calcium: 8.5 mg/dL — ABNORMAL LOW (ref 8.9–10.3)
Chloride: 109 mmol/L (ref 98–111)
Creatinine, Ser: 1.04 mg/dL — ABNORMAL HIGH (ref 0.44–1.00)
GFR, Estimated: 58 mL/min — ABNORMAL LOW (ref >60)
Glucose, Bld: 131 mg/dL — ABNORMAL HIGH (ref 70–99)
Potassium: 4.7 mmol/L (ref 3.5–5.1)
Sodium: 140 mmol/L (ref 135–145)

## 2021-11-07 LAB — CBC
HCT: 35.7 % — ABNORMAL LOW (ref 36.0–46.0)
Hemoglobin: 11.6 g/dL — ABNORMAL LOW (ref 12.0–15.0)
MCH: 34.4 pg — ABNORMAL HIGH (ref 26.0–34.0)
MCHC: 32.5 g/dL (ref 30.0–36.0)
MCV: 105.9 fL — ABNORMAL HIGH (ref 80.0–100.0)
Platelets: 199 10*3/uL (ref 150–400)
RBC: 3.37 MIL/uL — ABNORMAL LOW (ref 3.87–5.11)
RDW: 12 % (ref 11.5–15.5)
WBC: 11.9 10*3/uL — ABNORMAL HIGH (ref 4.0–10.5)
nRBC: 0 % (ref 0.0–0.2)

## 2021-11-07 MED ORDER — HYDROMORPHONE HCL 1 MG/ML IJ SOLN
1.0000 mg | INTRAMUSCULAR | Status: DC | PRN
  Administered 2021-11-07 – 2021-11-09 (×10): 1 mg via INTRAVENOUS
  Filled 2021-11-07 (×10): qty 1

## 2021-11-07 MED ORDER — ACETAMINOPHEN 325 MG PO TABS
650.0000 mg | ORAL_TABLET | Freq: Four times a day (QID) | ORAL | Status: DC
  Administered 2021-11-07 – 2021-11-11 (×16): 650 mg via ORAL
  Filled 2021-11-07 (×16): qty 2

## 2021-11-07 MED ORDER — METOPROLOL TARTRATE 5 MG/5ML IV SOLN
2.5000 mg | Freq: Four times a day (QID) | INTRAVENOUS | Status: DC
  Administered 2021-11-07 – 2021-11-08 (×3): 2.5 mg via INTRAVENOUS
  Filled 2021-11-07 (×3): qty 5

## 2021-11-07 MED ORDER — SODIUM CHLORIDE 0.9 % IV BOLUS
500.0000 mL | Freq: Once | INTRAVENOUS | Status: AC
  Administered 2021-11-07: 500 mL via INTRAVENOUS

## 2021-11-07 NOTE — Plan of Care (Signed)
°  Problem: Education: Goal: Knowledge of General Education information will improve Description: Including pain rating scale, medication(s)/side effects and non-pharmacologic comfort measures Outcome: Progressing   Problem: Health Behavior/Discharge Planning: Goal: Ability to manage health-related needs will improve Outcome: Progressing   Problem: Clinical Measurements: Goal: Ability to maintain clinical measurements within normal limits will improve Outcome: Progressing Goal: Will remain free from infection Outcome: Progressing Goal: Diagnostic test results will improve Outcome: Progressing Goal: Respiratory complications will improve Outcome: Progressing Goal: Cardiovascular complication will be avoided Outcome: Progressing   Problem: Nutrition: Goal: Adequate nutrition will be maintained Outcome: Progressing   Problem: Coping: Goal: Level of anxiety will decrease Outcome: Progressing   Problem: Elimination: Goal: Will not experience complications related to urinary retention Outcome: Progressing   Problem: Pain Managment: Goal: General experience of comfort will improve Outcome: Progressing   Problem: Safety: Goal: Ability to remain free from injury will improve Outcome: Progressing   Problem: Skin Integrity: Goal: Risk for impaired skin integrity will decrease Outcome: Progressing   

## 2021-11-07 NOTE — Progress Notes (Signed)
Notified of SBP in mid 80s.  Will give 552ml bolus and monitor response.

## 2021-11-07 NOTE — TOC Progression Note (Signed)
Transition of Care [facility]) - Initial/Assessment Note    Patient Details  Name: Darlene Griffith MRN: [id number] Date of Birth: November 13, 1950  Transition of Care [facility]) CM/SW Contact:    Milinda Antis, [place] Phone Number: 11/07/2021, 3:23 PM  Clinical Narrative:                  Transition of Care (TOC) Screening Note   Patient is from home and was admitted for Aortic Dissection.  Transition of Care Department [facility]) has reviewed patient and no TOC needs have been identified at this time. We will continue to monitor patient advancement through interdisciplinary progression rounds. If new patient transition needs arise, please place a TOC consult.          Patient Goals and CMS Choice        Expected Discharge Plan and Services                                                Prior Living Arrangements/Services                       Activities of Daily Living Home Assistive Devices/Equipment: Environmental consultant (specify type) (Intermittently uses rollator after recieving steroid hip injections.) ADL Screening (condition at time of admission) Patient's cognitive ability adequate to safely complete daily activities?: Yes Is the patient deaf or have difficulty hearing?: No Does the patient have difficulty seeing, even when wearing glasses/contacts?: No Does the patient have difficulty concentrating, remembering, or making decisions?: No Patient able to express need for assistance with ADLs?: Yes Does the patient have difficulty dressing or bathing?: No Independently performs ADLs?: Yes (appropriate for developmental age) Does the patient have difficulty walking or climbing stairs?: Yes Weakness of Legs: None Weakness of Arms/Hands: None  Permission Sought/Granted                  Emotional Assessment              Admission diagnosis:  Aortic dissection ([place]) [I71.00] Dissection of thoracoabdominal aorta ([place]) [I71.03] Patient Active Problem List    Diagnosis Date Noted   AKI (acute kidney injury) ([place])    Aortic dissection ([place]) 11/06/2021   Gastroesophageal reflux disease without esophagitis    Hyperthyroidism 04/19/2021   Snoring 03/02/2020   Tobacco use 03/02/2020   Genetic testing 02/10/2020   Family history of lung cancer    Personal history of colonic polyps    Dysphagia 01/06/2020   Cervical spondylosis with myelopathy and radiculopathy 07/05/2019   Neck pain, chronic 05/21/2019   Insomnia 07/27/2018   Multinodular goiter 07/14/2018   Chronic fatigue 06/15/2018   Midsternal chest pain 02/18/2015   Chest pain    Cough    Essential hypertension    Shortness of breath    Aortic stenosis    Pain in the chest    EKG abnormality    Malaise and fatigue    Lung nodule    Splenic cyst    Hepatic cyst    Hyperlipidemia    Hypertension 02/16/2015   PCP:  Martinique, Betty G, MD Pharmacy:   [facility] ([facility]), [place] [address] [place] [address] Phone: [phone] Fax: [place] [address] [place], [place] [address]  Mardene [place] [address] Phone: [phone] Fax: [phone]     Social Determinants of Health (SDOH) Interventions    Readmission Risk Interventions No flowsheet data found.

## 2021-11-07 NOTE — Progress Notes (Addendum)
°  Progress Note    11/07/2021 7:38 AM Hospital Day 1  Subjective:  still having some pain but much better than on arrival to hospital.  Denies any leg issues (numbness/pain) since being in the hospital.  Afebrile HR 60's-80's NSR 80's-110's  95% RA  Vitals:   11/07/21 0630 11/07/21 0715  BP: (!) 91/49   Pulse: 74   Resp: (!) 23   Temp:  98.9 F (37.2 C)  SpO2: 97%     Physical Exam: General:  appears more comfortable today than yesterday when seen in the ER Cardiac:  regular Lungs:  non labored Extremities:  bilateral feet are warm and motor and sensory are in tact; bilateral radial pulses are faintly palpable.    CBC    Component Value Date/Time   WBC 11.9 (H) 11/07/2021 0146   RBC 3.37 (L) 11/07/2021 0146   HGB 11.6 (L) 11/07/2021 0146   HCT 35.7 (L) 11/07/2021 0146   PLT 199 11/07/2021 0146   MCV 105.9 (H) 11/07/2021 0146   MCH 34.4 (H) 11/07/2021 0146   MCHC 32.5 11/07/2021 0146   RDW 12.0 11/07/2021 0146   LYMPHSABS 2.5 11/06/2021 0959   MONOABS 0.5 11/06/2021 0959   EOSABS 0.2 11/06/2021 0959   BASOSABS 0.0 11/06/2021 0959    BMET    Component Value Date/Time   NA 140 11/07/2021 0146   K 4.7 11/07/2021 0146   CL 109 11/07/2021 0146   CO2 22 11/07/2021 0146   GLUCOSE 131 (H) 11/07/2021 0146   BUN 23 11/07/2021 0146   CREATININE 1.04 (H) 11/07/2021 0146   CALCIUM 8.5 (L) 11/07/2021 0146   GFRNONAA 58 (L) 11/07/2021 0146   GFRAA >60 01/14/2020 1124    INR    Component Value Date/Time   INR 1.0 11/06/2021 0959     Intake/Output Summary (Last 24 hours) at 11/07/2021 0738 Last data filed at 11/07/2021 0447 Gross per 24 hour  Intake 1176.31 ml  Output --  Net 1176.31 ml     Assessment/Plan:  71 y.o. female admitted with Type B aortic dissection Hospital Day 1  -pt still with some pain but improved from yesterday.  No further numbness or pain in the lower extremities.  Pedal pulses are not palpable but feet are warm and well perfused with  motor and sensory in tact.   -continue tight BP control - currently not requiring any IV medications with soft BP.  -keep npo with ice chips for now until pt is seen by Dr. Stanford Breed.   Leontine Locket, PA-C [facility] [phone] 11/07/2021 7:38 AM   VASCULAR STAFF ADDENDUM: I have independently interviewed and examined the patient. I agree with the above.  Symptomatic improvement with impulse control. Keep HR <80; SBP < 120.  Cardiology consultation may be helpful to develop outpatient regimen. No plans for TEVAR. OK for regular diet from my standpoint. Following closely.  [name]. Stanford Breed, MD [name] Phone Number: [phone] 11/07/2021 9:08 AM

## 2021-11-07 NOTE — Progress Notes (Signed)
NAME:  Darlene Griffith, MRN:  [id number], DOB:  [birth year], LOS: 1 ADMISSION DATE:  11/06/2021, CONSULTATION DATE:  1/18 REFERRING MD:  Dr. Vanita Panda, CHIEF COMPLAINT:  Aortic dissection   History of Present Illness:  Patient is a 71 yo F w/ pertinent PMH of HTN, HLD, Hyperthyroidism, multinodular goiter present to [facility] ED on 1/18 with chest pain.  On 1/18, patient states she had acute onset chest pain  in upper back that radiates to her chest. EMS called. She states she has not been taking her home BP meds regularly. Arrived to [facility] ED. BP initially 136/76. Complaining of chest pain. EKG w/ sinus rhythm. Troponin and BNP WNL. CTA shows type B aortic dissection that extends into the infrarenal abdominal aorta and right common iliac artery. D-dimer 4.78. Started on Esmolol drip. Vascular surgery consulted.  PCCM consulted for ICU admission.  Pertinent  Medical History   Past Medical History:  Diagnosis Date   Chest pain    a. 01/2015 Echo: Nl LV fxn, Gr 1 DD, triv AI, mild MR.   Essential hypertension    Family history of lung cancer    Hepatic cyst    a. noted on CT 01/2015.   Hyperthyroidism    GOING TO [facility] FOR SECOND OPINION   Multinodular goiter    a. 01/2015 CT chest: multinodular goidter w/ substernal extension of the left lobe of the thyroid assoc w/ rightward deviation of tracheal air column.   Neck pain, chronic    Personal history of colonic polyps    Pulmonary nodules    a. 01/2015 CT Chest: RLL ~ 42mm subpleural nodule - rec f/u in 6-12 mos.   Splenic cyst    a. noted on CT 01/2015.     Significant Hospital Events: Including procedures, antibiotic start and stop dates in addition to other pertinent events   1/18: admitted to [facility]; aortic dissection; started on esmolol drip  Interim History / Subjective:  Patient awake and oriented. Chest pain improved BP soft and off labetalol drip  Objective   Blood pressure (!) 90/48, pulse 81, temperature 98.9 F (37.2 C),  temperature source Oral, resp. rate 11, height 5\' 1"  (1.549 m), weight 68 kg, SpO2 95 %.        Intake/Output Summary (Last 24 hours) at 11/07/2021 0835 Last data filed at 11/07/2021 0447 Gross per 24 hour  Intake 1176.31 ml  Output --  Net 1176.31 ml   Filed Weights   11/06/21 0911  Weight: 68 kg    Examination: General: NAD HEENT: MM pink/moist; [place] in place Neuro: Aox3; MAE CV: s1s2, RRR, no m/r/g PULM:  dim clear BS bilaterally; on 2L [place] GI: soft, bsx4 active  Extremities: warm/dry, no edema  Skin: no rashes or lesions appreciated  1/18 CTA chest 1. 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 2. Intramural hematoma is identified within the distal arch and descending thoracic aorta measuring 7 mm in thickness.   Resolved Hospital Problem list     Assessment & Plan:  Aortic Dissection: type B; extends into the infrarenal abdominal aorta and right common iliac artery Hx of HTN, HLD Hx of tobacco abuse P: -vascular surgery following -continue to monitor in ICU for continuous telemetry monitoring -will stop amlodipine, losartan, and labetalol due to soft BP -will start on low dose metoprolol later today for HR goal <  60 and SBP <120/80 -hold metoprolol if MAP <65 -prn dilaudid and tylenol for pain -smoking cessation counseling given -continue home statin -will need repeat CT before discharge  Leukocytosis: reactive likely due to above P: -WBC trending down -remains afebrile -trend wbc/fever curve  AKI P: -oral rehydration -stop losartan -Trend BMP / urinary output -Replace electrolytes as indicated -Avoid nephrotoxic agents, ensure adequate renal perfusion  GERD P: -PPI  Subclinical Hyperthyroidism Hx of Hyperthyroidism and multinodular goiter P: -no home meds listed -will need to follow  up outpatient  Hx of pulmonary nodules: 1/18 CT chest 5 mm nodule; same size as CT 2016 P: -further follow up outpatient  Best Practice (right click and "Reselect all SmartList Selections" daily)   Diet/type: Regular consistency (see orders) DVT prophylaxis: SCD GI prophylaxis: PPI Lines: N/A Foley:  N/A Code Status:  full code Last date of multidisciplinary goals of care discussion [1/19 updated daughter [name] and patient at bedside)  Critical care time:      Ethelene [facility] 11/07/2021, 8:35 AM  Please see [email or web address] for pager details.  From 7A-7P if no response, please call [phone]. After hours, please call ELink [phone].

## 2021-11-08 ENCOUNTER — Inpatient Hospital Stay (HOSPITAL_COMMUNITY): Payer: BC Managed Care – PPO

## 2021-11-08 DIAGNOSIS — J449 Chronic obstructive pulmonary disease, unspecified: Secondary | ICD-10-CM | POA: Diagnosis present

## 2021-11-08 LAB — BASIC METABOLIC PANEL
Anion gap: 8 (ref 5–15)
BUN: 12 mg/dL (ref 8–23)
CO2: 25 mmol/L (ref 22–32)
Calcium: 8.9 mg/dL (ref 8.9–10.3)
Chloride: 107 mmol/L (ref 98–111)
Creatinine, Ser: 0.48 mg/dL (ref 0.44–1.00)
GFR, Estimated: >60 mL/min (ref >60)
Glucose, Bld: 100 mg/dL — ABNORMAL HIGH (ref 70–99)
Potassium: 3.7 mmol/L (ref 3.5–5.1)
Sodium: 140 mmol/L (ref 135–145)

## 2021-11-08 LAB — CBC
HCT: 34.3 % — ABNORMAL LOW (ref 36.0–46.0)
Hemoglobin: 10.7 g/dL — ABNORMAL LOW (ref 12.0–15.0)
MCH: 33.4 pg (ref 26.0–34.0)
MCHC: 31.2 g/dL (ref 30.0–36.0)
MCV: 107.2 fL — ABNORMAL HIGH (ref 80.0–100.0)
Platelets: 188 10*3/uL (ref 150–400)
RBC: 3.2 MIL/uL — ABNORMAL LOW (ref 3.87–5.11)
RDW: 11.7 % (ref 11.5–15.5)
WBC: 11.9 10*3/uL — ABNORMAL HIGH (ref 4.0–10.5)
nRBC: 0 % (ref 0.0–0.2)

## 2021-11-08 LAB — T3, FREE: T3, Free: 3.1 pg/mL (ref 2.0–4.4)

## 2021-11-08 LAB — MAGNESIUM: Magnesium: 1.9 mg/dL (ref 1.7–2.4)

## 2021-11-08 LAB — TSH: TSH: 0.022 u[IU]/mL — ABNORMAL LOW (ref 0.350–4.500)

## 2021-11-08 LAB — T4, FREE: Free T4: 0.92 ng/dL (ref 0.61–1.12)

## 2021-11-08 MED ORDER — METOPROLOL TARTRATE 25 MG PO TABS
25.0000 mg | ORAL_TABLET | Freq: Two times a day (BID) | ORAL | Status: DC
  Administered 2021-11-08 (×2): 25 mg via ORAL
  Filled 2021-11-08 (×2): qty 1

## 2021-11-08 MED ORDER — IOHEXOL 350 MG/ML SOLN
70.0000 mL | Freq: Once | INTRAVENOUS | Status: AC | PRN
  Administered 2021-11-08: 70 mL via INTRAVENOUS

## 2021-11-08 MED ORDER — HYDRALAZINE HCL 20 MG/ML IJ SOLN
10.0000 mg | Freq: Four times a day (QID) | INTRAMUSCULAR | Status: DC | PRN
  Administered 2021-11-11: 10 mg via INTRAVENOUS
  Administered 2021-11-14: 5 mg via INTRAVENOUS
  Administered 2021-11-14 – 2021-11-15 (×2): 10 mg via INTRAVENOUS
  Filled 2021-11-08 (×6): qty 1

## 2021-11-08 MED ORDER — HYDRALAZINE HCL 10 MG PO TABS
10.0000 mg | ORAL_TABLET | Freq: Three times a day (TID) | ORAL | Status: DC
  Administered 2021-11-08: 10 mg via ORAL
  Filled 2021-11-08: qty 1

## 2021-11-08 MED ORDER — SENNOSIDES-DOCUSATE SODIUM 8.6-50 MG PO TABS
1.0000 | ORAL_TABLET | Freq: Two times a day (BID) | ORAL | Status: DC
  Administered 2021-11-08 – 2021-11-10 (×6): 1 via ORAL
  Filled 2021-11-08 (×6): qty 1

## 2021-11-08 MED ORDER — POLYETHYLENE GLYCOL 3350 17 G PO PACK
17.0000 g | PACK | Freq: Every day | ORAL | Status: DC
  Administered 2021-11-08 – 2021-11-15 (×7): 17 g via ORAL
  Filled 2021-11-08 (×7): qty 1

## 2021-11-08 MED ORDER — HYDRALAZINE HCL 10 MG PO TABS
10.0000 mg | ORAL_TABLET | Freq: Three times a day (TID) | ORAL | Status: DC
  Administered 2021-11-08: 10 mg via ORAL
  Filled 2021-11-08 (×2): qty 1

## 2021-11-08 MED ORDER — NICARDIPINE HCL IN NACL 20-0.86 MG/200ML-% IV SOLN
3.0000 mg/h | INTRAVENOUS | Status: DC
  Administered 2021-11-08: 5 mg/h via INTRAVENOUS
  Administered 2021-11-08 – 2021-11-09 (×9): 15 mg/h via INTRAVENOUS
  Filled 2021-11-08 (×13): qty 200

## 2021-11-08 NOTE — Assessment & Plan Note (Addendum)
CTA 1/18 noted Stanford type B dissection from level of left subclavian artery extending to infrarenal abdominal aorta, right common iliac artery -Vascular surgery following -Blood pressure control recommended -Yesterday in ICU all antihypertensives were discontinued due to hypotension -Transfer to stepdown unit today, change IV metoprolol to p.o., add p.o. hydralazine -PRN IV hydralazine -Continues to be limited by severe pain, plan for repeat CTA noted for VVS

## 2021-11-08 NOTE — Progress Notes (Signed)
Provided pt. With AD per her request. Pt. Appears to be having a good day. Pt was pleasant and family at bedside.  Chaplain available as needed.  Jaclynn Major, [place], [facility], Pager [phone]

## 2021-11-08 NOTE — Consult Note (Signed)
NAME:  Darlene Griffith, MRN:  [id number], DOB:  Dec 17, 1950, LOS: 2 ADMISSION DATE:  11/06/2021, CONSULTATION DATE:  1/18 REFERRING MD:  Dr. Vanita Panda, CHIEF COMPLAINT:  Aortic dissection   History of Present Illness:  Patient is a 71 yo F w/ pertinent PMH of HTN, HLD, Hyperthyroidism, multinodular goiter present to [facility] ED on 1/18 with chest pain.  On 1/18, patient states she had acute onset chest pain  in upper back that radiates to her chest. EMS called. She states she has not been taking her home BP meds regularly. Arrived to [facility] ED. BP initially 136/76. Complaining of chest pain. EKG w/ sinus rhythm. Troponin and BNP WNL. CTA shows type B aortic dissection that extends into the infrarenal abdominal aorta and right common iliac artery. D-dimer 4.78. Started on Esmolol drip. Vascular surgery consulted. On 1/19 she was off all drips. Transferred to progressive and TRH on 1/20.  The evening of 1/20 the patient became increasingly hypertensive with some chest pain. Vascular surgery started the patient on cardine GTT. Transfer to ICU. PCCM consulted  Pertinent  Medical History   Past Medical History:  Diagnosis Date   Chest pain    a. 01/2015 Echo: Nl LV fxn, Gr 1 DD, triv AI, mild MR.   Essential hypertension    Family history of lung cancer    Hepatic cyst    a. noted on CT 01/2015.   Hyperthyroidism    GOING TO [facility] FOR SECOND OPINION   Multinodular goiter    a. 01/2015 CT chest: multinodular goidter w/ substernal extension of the left lobe of the thyroid assoc w/ rightward deviation of tracheal air column.   Neck pain, chronic    Personal history of colonic polyps    Pulmonary nodules    a. 01/2015 CT Chest: RLL ~ 51mm subpleural nodule - rec f/u in 6-12 mos.   Splenic cyst    a. noted on CT 01/2015.     Significant Hospital Events: Including procedures, antibiotic start and stop dates in addition to other pertinent events   1/18: admitted to [facility]; aortic dissection; started on esmolol  drip  1/20: CTA chest  No change in descending AAA, HTN, started on cardiene GTT, TRH placed ICU transfer orders and PCCM consult  Interim History / Subjective:  Cardine 15 mg  Subjective: Chest pain improved s/p nicardicpine gtt and   Objective   Blood pressure 138/79, pulse 90, temperature 98.1 F (36.7 C), temperature source Oral, resp. rate 13, height 5\' 1"  (1.549 m), weight 66.7 kg, SpO2 95 %.        Intake/Output Summary (Last 24 hours) at 11/08/2021 2107 Last data filed at 11/08/2021 1800 Gross per 24 hour  Intake 360 ml  Output --  Net 360 ml   Filed Weights   11/06/21 0911 11/07/21 2050 11/08/21 2021  Weight: 68 kg 69.7 kg 66.7 kg    Examination: General: In bed, NAD, appears comfortable HEENT: MM pink/moist, anicteric, atraumatic Neuro: RASS 0, PERRL 80mm, GCS 15 CV: S1S2, NSR, no m/r/g appreciated PULM:  clear in the upper lobes, clear in the lower lobes, trachea midline, chest expansion symmetric GI: soft, bsx4 active, non-tender   Extremities: warm/dry, no pretibial edema, capillary refill less than 3 seconds  Skin:  no rashes or lesions noted  BMP reviewed WBC 11.9>11.9 HGB 10.7, MCV 107 MG 1.9 T4 0.92 TSH 0.022  CTA 1/20- No change in descending AAA  Resolved Hospital Problem list     Assessment &  Plan:  Aortic Dissection: type B; extends into the infrarenal abdominal aorta and right common iliac artery HTN HX HLD Hx of tobacco abuse Per med review, did not get 1500 PO hydralazine or PRN hydralazine on floor P: -Vascular surgery following, tentatively planning for TEVAR on 1/23 per notes -Goal SBP <120, HR less than 80, transfer to ICU, continue nicardipine -PRN dilaudid and tylenol for pain -ongoing smoking cessation education -continue home statin -Continue PO metoprolol 25 MG, BID and hydralazine PO 10 MG TID   Leukocytosis:  Suspect reactive due to above. WBC 11.9>11.9 P: -Trend WBC/Fever curve  Nausea Suspect secondary to aortic  dissection -PRN zofran and compazine  AKI- Improved Creat 1.04>0.48 P: -Ensure renal perfusion. Goal MAP 65 or greater. -Avoid neprotoxic drugs as possible. -Strict I&O's -Follow up AM creatinine  Subclinical Hyperthyroidism Hx of Hyperthyroidism and multinodular goiter P: -no home meds listed -will need follow up outpatient  Hx of pulmonary nodules: 1/18 CT chest 5 mm nodule; same size as CT 2016 P: -will need follow up outpatient   Best Practice (right click and "Reselect all SmartList Selections" daily)   Diet/type: Regular consistency (see orders) DVT prophylaxis: SCD GI prophylaxis: N/A Lines: N/A Foley:  N/A Code Status:  full code Last date of multidisciplinary goals of care discussion [ pending )  Labs   CBC: Recent Labs  Lab 11/06/21 0959 11/07/21 0146 11/08/21 0149  WBC 12.7* 11.9* 11.9*  NEUTROABS 9.5*  --   --   HGB 14.0 11.6* 10.7*  HCT 42.6 35.7* 34.3*  MCV 104.9* 105.9* 107.2*  PLT 261 199 322    Basic Metabolic Panel: Recent Labs  Lab 11/06/21 0959 11/07/21 0146 11/08/21 0149  NA 143 140 140  K 3.6 4.7 3.7  CL 109 109 107  CO2 22 22 25   GLUCOSE 136* 131* 100*  BUN 12 23 12   CREATININE 0.60 1.04* 0.48  CALCIUM 9.3 8.5* 8.9  MG 1.8  --  1.9   GFR: Estimated Creatinine Clearance: 57.2 mL/min (by C-G formula based on SCr of 0.48 mg/dL). Recent Labs  Lab 11/06/21 0959 11/06/21 1345 11/06/21 1621 11/07/21 0146 11/08/21 0149  WBC 12.7*  --   --  11.9* 11.9*  LATICACIDVEN  --  1.3 1.3  --   --     Liver Function Tests: Recent Labs  Lab 11/06/21 0959  AST 17  ALT 13  ALKPHOS 75  BILITOT 0.6  PROT 7.3  ALBUMIN 3.8   No results for input(s): LIPASE, AMYLASE in the last 168 hours. No results for input(s): AMMONIA in the last 168 hours.  ABG    Component Value Date/Time   TCO2 28 08/16/2018 1035     Coagulation Profile: Recent Labs  Lab 11/06/21 0959  INR 1.0    Cardiac Enzymes: No results for input(s): CKTOTAL,  CKMB, CKMBINDEX, TROPONINI in the last 168 hours.  HbA1C: Hgb A1c MFr Bld  Date/Time Value Ref Range Status  05/21/2019 11:41 AM 5.0 4.8 - 5.6 % Final    Comment:    (NOTE) Pre diabetes:          5.7%-6.4% Diabetes:              >6.4% Glycemic control for   <7.0% adults with diabetes   02/17/2015 05:01 AM 5.5 4.8 - 5.6 % Final    Comment:    (NOTE)         Pre-diabetes: 5.7 - 6.4         Diabetes: >  6.4         Glycemic control for adults with diabetes: <7.0     CBG: Recent Labs  Lab 11/06/21 0957 11/06/21 2004  GLUCAP 128* 130*    Review of Systems:   Positives in bold  Gen: fever, chills, weight change, fatigue, night sweats HEENT:  blurred vision, double vision, hearing loss, tinnitus, sinus congestion, rhinorrhea, sore throat, neck stiffness, dysphagia PULM:  shortness of breath, cough, sputum production, hemoptysis, wheezing CV: chest pain, edema, orthopnea, paroxysmal nocturnal dyspnea, palpitations GI:  abdominal pain, nausea, vomiting, diarrhea, hematochezia, melena, constipation, change in bowel habits GU: dysuria, hematuria, polyuria, oliguria, urethral discharge Endocrine: hot or cold intolerance, polyuria, polyphagia or appetite change Derm: rash, dry skin, scaling or peeling skin change Heme: easy bruising, bleeding, bleeding gums Neuro: headache, numbness, weakness, slurred speech, loss of memory or consciousness   Past Medical History:  She,  has a past medical history of Chest pain, Essential hypertension, Family history of lung cancer, Hepatic cyst, Hyperthyroidism, Multinodular goiter, Neck pain, chronic, Personal history of colonic polyps, Pulmonary nodules, and Splenic cyst.   Surgical History:   Past Surgical History:  Procedure Laterality Date   CERVICAL DISC ARTHROPLASTY N/A 07/05/2019   Procedure: Cervical Six-Seven Artificial disc replacement;  Surgeon: Kristeen Miss, MD;  Location: [place];  Service: Neurosurgery;  Laterality: N/A;  Cervical  Six-Seven Artificial disc replacement   CESAREAN SECTION     COLONOSCOPY  01/13/2020   TONSILLECTOMY     AROUND 5-6 YRS OLD   UPPER GASTROINTESTINAL ENDOSCOPY  01/13/2020     Social History:   reports that she has been smoking cigarettes. She has never used smokeless tobacco. She reports current alcohol use. She reports that she does not use drugs.   Family History:  Her family history includes Cancer in her maternal uncle and sister; Heart attack in her maternal grandmother; High Cholesterol in her mother; High blood pressure in her mother; Lung cancer in her father; Thyroid disease in her mother and sister. There is no history of Colon cancer, Colon polyps, Esophageal cancer, Rectal cancer, or Stomach cancer.   Allergies Allergies  Allergen Reactions   Shellfish Allergy Anaphylaxis     Home Medications  Prior to Admission medications   Medication Sig Start Date End Date Taking? Authorizing Provider  amLODipine (NORVASC) 5 MG tablet Take 1 tablet (5 mg total) by mouth at bedtime. 04/12/21  Yes Martinique, Betty G, MD  Aspirin-Acetaminophen (GOODYS BACK & BODY PAIN PO) Take 1 Package by mouth daily as needed (pain).   Yes [provider]  aspirin-acetaminophen-caffeine (EXCEDRIN MIGRAINE) [phone] MG tablet Take 2 tablets by mouth every 6 (six) hours as needed for headache.   Yes [provider]  clotrimazole-betamethasone (LOTRISONE) cream Apply to affected area 2 times daily prn 04/24/20  Yes Raylene Everts, MD  atorvastatin (LIPITOR) 40 MG tablet Take 1 tablet by mouth once daily Patient not taking: Reported on 11/06/2021 04/18/21   Martinique, Betty G, MD  diclofenac Sodium (VOLTAREN) 1 % GEL Apply 4 g topically 4 (four) times daily. Patient not taking: Reported on 11/06/2021 04/28/21   Deno Etienne, DO  losartan (COZAAR) 25 MG tablet Take 1 tablet (25 mg total) by mouth daily. Patient not taking: Reported on 11/06/2021 04/12/21   Martinique, Betty G, MD  [facility] 25 MG TB24  tablet Take 1 tablet by mouth once daily Patient not taking: Reported on 11/06/2021 08/12/21   Martin[place], Betty G, MD  Tiotropium Bromide Monohydrate ([place])  2.5 MCG/ACT AERS Inhale 2 puffs into the lungs daily. Patient not taking: Reported on 11/06/2021 06/08/20   Deneise Lever, MD     Critical care time: 35 minutes     Redmond School., MSN, APRN, AGACNP-BC [facility]  11/08/2021 , 9:07 PM  Please see [email or web address] for pager details  If no response, please call [phone] After hours, please call Elink at [phone]

## 2021-11-08 NOTE — Assessment & Plan Note (Addendum)
History of multinodular goiter -Will check TSH, free T4

## 2021-11-08 NOTE — Assessment & Plan Note (Signed)
See discussion above 

## 2021-11-08 NOTE — Significant Event (Signed)
Rapid Response Event Note   Asked to assist with initiation of Cardene gtt and tx to ICU for BP management. BP 151/86. Cardene gtt started at 5mg /hr to keep SBP <120. Pt transferred to 3M04.    Call [place] End Time:2015  Dillard Essex, RN

## 2021-11-08 NOTE — Progress Notes (Addendum)
°  Progress Note    11/08/2021 7:53 AM * No surgery found *  Subjective:  chest pain and L shoulder pain somewhat worse overnight.  Made worse with talking and exertion   Vitals:   11/08/21 0446 11/08/21 0502  BP: (!) 159/66 129/67  Pulse: 81 87  Resp:  15  Temp:  99.3 F (37.4 C)  SpO2: 96% 97%   Physical Exam: Lungs:  non labored Extremities:  palpable DP pulses Abdomen:  soft, NT, ND Neurologic: A&O  CBC    Component Value Date/Time   WBC 11.9 (H) 11/08/2021 0149   RBC 3.20 (L) 11/08/2021 0149   HGB 10.7 (L) 11/08/2021 0149   HCT 34.3 (L) 11/08/2021 0149   PLT 188 11/08/2021 0149   MCV 107.2 (H) 11/08/2021 0149   MCH 33.4 11/08/2021 0149   MCHC 31.2 11/08/2021 0149   RDW 11.7 11/08/2021 0149   LYMPHSABS 2.5 11/06/2021 0959   MONOABS 0.5 11/06/2021 0959   EOSABS 0.2 11/06/2021 0959   BASOSABS 0.0 11/06/2021 0959    BMET    Component Value Date/Time   NA 140 11/08/2021 0149   K 3.7 11/08/2021 0149   CL 107 11/08/2021 0149   CO2 25 11/08/2021 0149   GLUCOSE 100 (H) 11/08/2021 0149   BUN 12 11/08/2021 0149   CREATININE 0.48 11/08/2021 0149   CALCIUM 8.9 11/08/2021 0149   GFRNONAA >60 11/08/2021 0149   GFRAA >60 01/14/2020 1124    INR    Component Value Date/Time   INR 1.0 11/06/2021 0959     Intake/Output Summary (Last 24 hours) at 11/08/2021 0753 Last data filed at 11/07/2021 1930 Gross per 24 hour  Intake 360 ml  Output 325 ml  Net 35 ml     Assessment/Plan:  71 y.o. female with type B aortic dissection * No surgery found *   Pain worsening overnight now concentrated in chest and L shoulder No signs or symptoms of malperfusion on exam Continue parameters of SBP<120 and HR <80 Recheck CTA c/a/p this morning given worsening pain   Dagoberto Ligas, PA-C [facility] [phone] 11/08/2021 7:53 AM  VASCULAR STAFF ADDENDUM: I have independently interviewed and examined the patient. I agree with the above.  Re-scanned  today for return of chest pain despite HR/BP control.  No interval change on CT angiogram prompting emergent repair.  Will tentatively plan TEVAR Monday with gore branched device, as I suspect she will continue to have pain over the weekend.  [name]. Stanford Breed, MD [facility] Phone Number: [phone] 11/08/2021 12:41 PM

## 2021-11-08 NOTE — Assessment & Plan Note (Signed)
Stable

## 2021-11-08 NOTE — Progress Notes (Signed)
PROGRESS NOTE    KATHERYNE GORR  PJK:[id number] DOB: 1950-12-31 DOA: 11/06/2021 PCP: [place], Betty G, MD  Brief Narrative: 71/F with history of hypertension, dyslipidemia, hypothyroidism, multinodular goiter presented to the ED on 1/18 with chest pain, this was severe sharp radiating down to her abdomen. -CTA noted type B aortic dissection extending from below subclavian to infrarenal abdominal aorta and right common iliac artery, she was started on an esmolol drip and admitted to the ICU -Seen by vascular surgery, recommended blood pressure control and supportive care -Subsequently had drop in blood pressure, antihypertensives were discontinued -Transferred to [facility] today 1/20   Subjective: Continues to be limited by severe pain going down her chest, upper back etc.  Assessment & Plan: * Aortic dissection (HCC)- (present on admission) CTA 1/18 noted Stanford type B dissection from level of left subclavian artery extending to infrarenal abdominal aorta, right common iliac artery -Vascular surgery following -Blood pressure control recommended -Yesterday in ICU all antihypertensives were discontinued due to hypotension -Transfer to stepdown unit today, change IV metoprolol to p.o., add p.o. hydralazine -PRN IV hydralazine -Continues to be limited by severe pain, plan for repeat CTA noted for [facility]  COPD (chronic obstructive pulmonary disease) ([place])- (present on admission) Stable  AKI (acute kidney injury) ([place])- (present on admission) Resolved  Hyperthyroidism- (present on admission) History of multinodular goiter -Will check TSH, free T4  Hypertension- (present on admission) See discussion above   DVT prophylaxis: SCDs Code Status: Full code Family Communication: Daughter at bedside Disposition Plan: Home pending clinical stability, possibly 3 to 4 days   Consultants:  Vascular surgery  Procedures:   Antimicrobials:    Objective: Vitals:   11/08/21 0446  11/08/21 0502 11/08/21 0757 11/08/21 0845  BP: (!) 159/66 129/67 134/67   Pulse: 81 87 77 77  Resp:  15 16   Temp:  99.3 F (37.4 C) 98.5 F (36.9 C)   TempSrc:  Oral Oral   SpO2: 96% 97% 96% 94%  Weight:      Height:        Intake/Output Summary (Last 24 hours) at 11/08/2021 1152 Last data filed at 11/07/2021 1930 Gross per 24 hour  Intake 240 ml  Output 325 ml  Net -85 ml   Filed Weights   11/06/21 0911 11/07/21 2050  Weight: 68 kg 69.7 kg    Examination:  General exam: AAOx3, uncomfortable appearing Respiratory system: Clear to auscultation Cardiovascular system: S1 & S2 heard, RRR.  Abd: nondistended, soft and nontender.Normal bowel sounds heard. Central nervous system: Alert and oriented. No focal neurological deficits. Extremities: no edema, warm extremities, positive DP pulses Skin: No rashes Psychiatry:  Mood & affect appropriate.     Data Reviewed:   CBC: Recent Labs  Lab 11/06/21 0959 11/07/21 0146 11/08/21 0149  WBC 12.7* 11.9* 11.9*  NEUTROABS 9.5*  --   --   HGB 14.0 11.6* 10.7*  HCT 42.6 35.7* 34.3*  MCV 104.9* 105.9* 107.2*  PLT 261 199 809   Basic Metabolic Panel: Recent Labs  Lab 11/06/21 0959 11/07/21 0146 11/08/21 0149  NA 143 140 140  K 3.6 4.7 3.7  CL 109 109 107  CO2 22 22 25   GLUCOSE 136* 131* 100*  BUN 12 23 12   CREATININE 0.60 1.04* 0.48  CALCIUM 9.3 8.5* 8.9  MG 1.8  --  1.9   GFR: Estimated Creatinine Clearance: 58.5 mL/min (by C-G formula based on SCr of 0.48 mg/dL). Liver Function Tests: Recent Labs  Lab 11/06/21  0959  AST 17  ALT 13  ALKPHOS 75  BILITOT 0.6  PROT 7.3  ALBUMIN 3.8   No results for input(s): LIPASE, AMYLASE in the last 168 hours. No results for input(s): AMMONIA in the last 168 hours. Coagulation Profile: Recent Labs  Lab 11/06/21 0959  INR 1.0   Cardiac Enzymes: No results for input(s): CKTOTAL, CKMB, CKMBINDEX, TROPONINI in the last 168 hours. BNP (last 3 results) No results for  input(s): PROBNP in the last 8760 hours. HbA1C: No results for input(s): HGBA1C in the last 72 hours. CBG: Recent Labs  Lab 11/06/21 0957 11/06/21 2004  GLUCAP 128* 130*   Lipid Profile: No results for input(s): CHOL, HDL, LDLCALC, TRIG, CHOLHDL, LDLDIRECT in the last 72 hours. Thyroid Function Tests: Recent Labs    11/06/21 1255 11/06/21 1621  TSH 0.021*  --   FREET4  --  1.12  T3FREE  --  3.1   Anemia Panel: No results for input(s): VITAMINB12, FOLATE, FERRITIN, TIBC, IRON, RETICCTPCT in the last 72 hours. Urine analysis:    Component Value Date/Time   COLORURINE YELLOW 12/02/2019 [place] 12/02/2019 1053   LABSPEC 1.025 12/02/2019 1053   PHURINE 6.0 12/02/2019 1053   GLUCOSEU NEGATIVE 12/02/2019 1053   HGBUR MODERATE (A) 12/02/2019 1053   BILIRUBINUR NEGATIVE 12/02/2019 1053   KETONESUR NEGATIVE 12/02/2019 1053   PROTEINUR NEGATIVE 02/16/2015 1935   UROBILINOGEN 0.2 12/02/2019 1053   NITRITE NEGATIVE 12/02/2019 1053   LEUKOCYTESUR NEGATIVE 12/02/2019 1053   Sepsis Labs: @LABRCNTIP (procalcitonin:4,lacticidven:4)  ) Recent Results (from the past 240 hour(s))  Resp Panel by RT-PCR (Flu A&B, Covid) Nasopharyngeal Swab     Status: None   Collection Time: 11/06/21 12:29 PM   Specimen: Nasopharyngeal Swab; Nasopharyngeal(NP) swabs in vial transport medium  Result Value Ref Range Status   SARS Coronavirus 2 by RT PCR NEGATIVE NEGATIVE Final    Comment: (NOTE) SARS-CoV-2 target nucleic acids are NOT DETECTED.  The SARS-CoV-2 RNA is generally detectable in upper respiratory specimens during the acute phase of infection. The lowest concentration of SARS-CoV-2 viral copies this assay can detect is 138 copies/mL. A negative result does not preclude SARS-Cov-2 infection and should not be used as the sole basis for treatment or other patient management decisions. A negative result may occur with  improper specimen collection/handling, submission of  specimen other than nasopharyngeal swab, presence of viral mutation(s) within the areas targeted by this assay, and inadequate number of viral copies(<138 copies/mL). A negative result must be combined with clinical observations, patient history, and epidemiological information. The expected result is Negative.  Fact Sheet for Patients:  EntrepreneurPulse.com.au  Fact Sheet for Healthcare Providers:  IncredibleEmployment.be  This test is no t yet approved or cleared by the [place] FDA and  has been authorized for detection and/or diagnosis of SARS-CoV-2 by FDA under an Emergency Use Authorization (EUA). This EUA will remain  in effect (meaning this test can be used) for the duration of the COVID-19 declaration under Section 564(b)(1) of the Act, 21 U.S.C.section 360bbb-3(b)(1), unless the authorization is terminated  or revoked sooner.       Influenza A by PCR NEGATIVE NEGATIVE Final   Influenza B by PCR NEGATIVE NEGATIVE Final    Comment: (NOTE) The Xpert Xpress SARS-CoV-2/FLU/RSV plus assay is intended as an aid in the diagnosis of influenza from Nasopharyngeal swab specimens and should not be used as a sole basis for treatment. Nasal washings and aspirates are unacceptable for Xpert Xpress SARS-CoV-2/FLU/RSV  testing.  Fact Sheet for Patients: EntrepreneurPulse.com.au  Fact Sheet for Healthcare Providers: IncredibleEmployment.be  This test is not yet approved or cleared by the [place] FDA and has been authorized for detection and/or diagnosis of SARS-CoV-2 by FDA under an Emergency Use Authorization (EUA). This EUA will remain in effect (meaning this test can be used) for the duration of the COVID-19 declaration under Section 564(b)(1) of the Act, 21 U.S.C. section 360bbb-3(b)(1), unless the authorization is terminated or revoked.  Performed at [facility], [place] [address]  [place], [place] [address]   MRSA Next Gen by PCR, Nasal     Status: None   Collection Time: 11/06/21  5:51 PM   Specimen: Nasal Mucosa; Nasal Swab  Result Value Ref Range Status   MRSA by PCR Next Gen NOT DETECTED NOT DETECTED Final    Comment: (NOTE) The GeneXpert MRSA Assay (FDA approved for NASAL specimens only), is one component of a comprehensive MRSA colonization surveillance program. It is not intended to diagnose MRSA infection nor to guide or monitor treatment for MRSA infections. Test performance is not FDA approved in patients less than 83 years old. Performed at [facility], [place] [address] [place],  [address]      Radiology Studies: CT Angio Chest/Abd/Pel for Dissection W and/or Wo Contrast  Result Date: 11/06/2021 CLINICAL DATA:  Evaluate for aortic dissection. EXAM: CT ANGIOGRAPHY CHEST, ABDOMEN AND PELVIS TECHNIQUE: Non-contrast CT of the chest was initially obtained. Multidetector CT imaging through the chest, abdomen and pelvis was performed using the standard protocol during bolus administration of intravenous contrast. Multiplanar reconstructed images and MIPs were obtained and reviewed to evaluate the vascular anatomy. RADIATION DOSE REDUCTION: This exam was performed according to the departmental dose-optimization program which includes automated exposure control, adjustment of the mA and/or kV according to patient size and/or use of iterative reconstruction technique. CONTRAST:  131mL OMNIPAQUE IOHEXOL 350 MG/ML SOLN COMPARISON:  08/31/2020 FINDINGS: CTA CHEST FINDINGS Cardiovascular: There is an acute Stanford type B (DeBakey type 3b dissection) aortic dissection. The dissection flap originates at the origin of the left subclavian artery and extends into the infrarenal abdominal aorta and right common iliac artery and terminates just beyond the bifurcation, image 196/5. The vessels originating off the arch are supplied by the true lumen. There is contrast  identified within both the true and false lumen with mixing of contrast noted in the false lumen proximally. Small intramural hematoma involving the aortic arch and descending thoracic aorta is also noted which measures 7 mm, image 23/6. The heart size appears normal. The thoracic aorta has a normal caliber. The ascending aorta measures 3 cm, image 56/9. The transverse aortic arch measures 2.9 cm. Aortic atherosclerotic calcifications and coronary artery calcifications noted. The main pulmonary artery and its proximal branches appear patent. Mediastinum/Nodes: Enlarged and heterogeneous thyroid gland with substernal extension into the mediastinum is identified. This has been evaluated on previous imaging. (ref: J Am Coll Radiol. 2015 Feb;12(2): 143-50).Trachea is deviated right of the midline by the goiter. Insert thyroid no enlarged lymph nodes. Lungs/Pleura: Mild paraseptal emphysema. No pleural effusion, airspace consolidation or pneumothorax. Stable 5 mm perifissural nodule adjacent to the minor fissure, image 76/7. Musculoskeletal: Degenerative disc disease noted within the thoracic spine. No acute or suspicious osseous findings. Review of the MIP images confirms the above findings. CTA ABDOMEN AND PELVIS FINDINGS VASCULAR Aorta: Aortic dissection extends below the renal arteries into the right common iliac artery and terminates just beyond the  bifurcation. Celiac: The celiac artery arises off the true lumen. No evidence of aneurysm, dissection or stenosis. SMA: The celiac artery arises off the true lumen. Patent without evidence of aneurysm, dissection, vasculitis or significant stenosis. Renals: Both renal arteries arise off the true lumen. Both renal arteries are patent without evidence of aneurysm, dissection, vasculitis, fibromuscular dysplasia or significant stenosis. IMA: The IMA appears to arise off the false lumen, image 165/5 and image 70/4. The vessel appears patent without evidence for dissection,  vasculitis or stenosis. Inflow: See above Veins: No obvious venous abnormality within the limitations of this arterial phase study. Review of the MIP images confirms the above findings. NON-VASCULAR Hepatobiliary: Scattered liver cysts are again noted in appears similar to previous exam. Gallbladder appears normal. No bile duct dilatation. Pancreas: Unremarkable. No pancreatic ductal dilatation or surrounding inflammatory changes. Spleen: Normal in size without focal abnormality. Adrenals/Urinary Tract: Normal adrenal glands. The kidneys appear scratch set normal and symmetric enhancement of both kidneys. Several right kidney cysts are identified. The largest arises off the lateral cortex of the inferior pole measuring 1.8 cm, image 162/5. No signs of nephrolithiasis or hydronephrosis. Bladder is unremarkable. Stomach/Bowel: Stomach is normal. No bowel wall thickening, inflammation, or distension. Distal colonic diverticula noted without signs of acute inflammation Lymphatic: No abdominopelvic adenopathy. Reproductive: Uterus and bilateral adnexa are unremarkable. Other: No ascites or focal fluid collections within the abdomen or pelvis. Musculoskeletal: No acute or significant osseous findings. Review of the MIP images confirms the above findings. IMPRESSION: 1. 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 2. Intramural hematoma is identified within the distal arch and descending thoracic aorta measuring 7 mm in thickness. 3. The inferior mesenteric artery appears to arise off the false lumen. There is contrast opacification of the IMA however. No signs of bowel ischemia at this time. 4. No evidence for solid organ ischemia within the abdomen or pelvis. 5. Aortic Atherosclerosis (ICD10-I70.0) and Emphysema (ICD10-J43.9). Critical Value/emergent results  were called by telephone at the time of interpretation on 11/06/2021 at 12:44 pm to provider Hailey Page PA-C , who verbally acknowledged these results. Electronically Signed   By: Kerby Moors M.D.   On: 11/06/2021 12:46     Scheduled Meds:  acetaminophen  650 mg Oral Q6H   atorvastatin  40 mg Oral Daily   Chlorhexidine Gluconate Cloth  6 each Topical Daily   hydrALAZINE  10 mg Oral TID   metoprolol tartrate  25 mg Oral BID   polyethylene glycol  17 g Oral Daily   senna-docusate  1 tablet Oral BID   Continuous Infusions:   LOS: 2 days    Time spent: 34min    Domenic Polite, MD [facility]   11/08/2021, 11:52 AM

## 2021-11-08 NOTE — Assessment & Plan Note (Signed)
Resolved

## 2021-11-09 DIAGNOSIS — I10 Essential (primary) hypertension: Secondary | ICD-10-CM

## 2021-11-09 DIAGNOSIS — I7100 Dissection of unspecified site of aorta: Secondary | ICD-10-CM

## 2021-11-09 LAB — BASIC METABOLIC PANEL
Anion gap: 11 (ref 5–15)
BUN: 9 mg/dL (ref 8–23)
CO2: 20 mmol/L — ABNORMAL LOW (ref 22–32)
Calcium: 8.4 mg/dL — ABNORMAL LOW (ref 8.9–10.3)
Chloride: 106 mmol/L (ref 98–111)
Creatinine, Ser: 0.45 mg/dL (ref 0.44–1.00)
GFR, Estimated: >60 mL/min (ref >60)
Glucose, Bld: 96 mg/dL (ref 70–99)
Potassium: 4 mmol/L (ref 3.5–5.1)
Sodium: 137 mmol/L (ref 135–145)

## 2021-11-09 LAB — CBC
HCT: 33.7 % — ABNORMAL LOW (ref 36.0–46.0)
Hemoglobin: 10.6 g/dL — ABNORMAL LOW (ref 12.0–15.0)
MCH: 33.5 pg (ref 26.0–34.0)
MCHC: 31.5 g/dL (ref 30.0–36.0)
MCV: 106.6 fL — ABNORMAL HIGH (ref 80.0–100.0)
Platelets: 195 10*3/uL (ref 150–400)
RBC: 3.16 MIL/uL — ABNORMAL LOW (ref 3.87–5.11)
RDW: 11.5 % (ref 11.5–15.5)
WBC: 13.6 10*3/uL — ABNORMAL HIGH (ref 4.0–10.5)
nRBC: 0 % (ref 0.0–0.2)

## 2021-11-09 MED ORDER — OXYCODONE HCL 5 MG PO TABS
5.0000 mg | ORAL_TABLET | ORAL | Status: DC | PRN
  Administered 2021-11-09: 10 mg via ORAL
  Administered 2021-11-09 – 2021-11-10 (×3): 5 mg via ORAL
  Filled 2021-11-09: qty 1
  Filled 2021-11-09: qty 2
  Filled 2021-11-09 (×2): qty 1

## 2021-11-09 MED ORDER — HYDRALAZINE HCL 25 MG PO TABS: 25.0000 mg | ORAL_TABLET | Freq: Three times a day (TID) | ORAL | Status: DC

## 2021-11-09 MED ORDER — HYDRALAZINE HCL 10 MG PO TABS
10.0000 mg | ORAL_TABLET | Freq: Three times a day (TID) | ORAL | Status: DC
  Administered 2021-11-09 – 2021-11-14 (×15): 10 mg via ORAL
  Filled 2021-11-09 (×16): qty 1

## 2021-11-09 MED ORDER — CARVEDILOL 12.5 MG PO TABS
12.5000 mg | ORAL_TABLET | Freq: Two times a day (BID) | ORAL | Status: DC
  Administered 2021-11-09 – 2021-11-10 (×3): 12.5 mg via ORAL
  Filled 2021-11-09 (×3): qty 1

## 2021-11-09 MED ORDER — HYDROMORPHONE HCL 1 MG/ML IJ SOLN
1.0000 mg | INTRAMUSCULAR | Status: DC | PRN
  Administered 2021-11-09 – 2021-11-11 (×8): 1 mg via INTRAVENOUS
  Filled 2021-11-09 (×8): qty 1

## 2021-11-09 MED ORDER — OXYCODONE HCL 5 MG PO TABS: 5.0000 mg | ORAL_TABLET | Freq: Four times a day (QID) | ORAL | Status: DC | PRN

## 2021-11-09 NOTE — Consult Note (Signed)
NAME:  Darlene Griffith, MRN:  [id number], DOB:  04-Jun-1951, LOS: 3 ADMISSION DATE:  11/06/2021, CONSULTATION DATE:  1/18 REFERRING MD:  Dr. Vanita Panda, CHIEF COMPLAINT:  Aortic dissection   History of Present Illness:  Patient is a 71 yo F w/ pertinent PMH of HTN, HLD, Hyperthyroidism, multinodular goiter present to [facility] ED on 1/18 with chest pain.  On 1/18, patient states she had acute onset chest pain  in upper back that radiates to her chest. EMS called. She states she has not been taking her home BP meds regularly. Arrived to [facility] ED. BP initially 136/76. Complaining of chest pain. EKG w/ sinus rhythm. Troponin and BNP WNL. CTA shows type B aortic dissection that extends into the infrarenal abdominal aorta and right common iliac artery. D-dimer 4.78. Started on Esmolol drip. Vascular surgery consulted. On 1/19 she was off all drips. Transferred to progressive and TRH on 1/20.  The evening of 1/20 the patient became increasingly hypertensive with some chest pain. Vascular surgery started the patient on cardine GTT. Transfer to ICU. PCCM consulted  Pertinent  Medical History   Past Medical History:  Diagnosis Date   Chest pain    a. 01/2015 Echo: Nl LV fxn, Gr 1 DD, triv AI, mild MR.   Essential hypertension    Family history of lung cancer    Hepatic cyst    a. noted on CT 01/2015.   Hyperthyroidism    GOING TO [facility] FOR SECOND OPINION   Multinodular goiter    a. 01/2015 CT chest: multinodular goidter w/ substernal extension of the left lobe of the thyroid assoc w/ rightward deviation of tracheal air column.   Neck pain, chronic    Personal history of colonic polyps    Pulmonary nodules    a. 01/2015 CT Chest: RLL ~ 71mm subpleural nodule - rec f/u in 6-12 mos.   Splenic cyst    a. noted on CT 01/2015.     Significant Hospital Events: Including procedures, antibiotic start and stop dates in addition to other pertinent events   1/18: admitted to [facility]; aortic dissection; started on esmolol  drip  1/20: CTA chest  No change in descending AAA, HTN, started on cardiene GTT, TRH placed ICU transfer orders and PCCM consult  Interim History / Subjective:  Transferred to MICU overnight for CP referable to dissection, started on cardene by vascular surgery with goal SBP <120.  Objective   Blood pressure (!) 108/57, pulse 74, temperature 97.9 F (36.6 C), temperature source Oral, resp. rate 12, height 5\' 1"  (1.549 m), weight 67.8 kg, SpO2 96 %.        Intake/Output Summary (Last 24 hours) at 11/09/2021 0716 Last data filed at 11/09/2021 0600 Gross per 24 hour  Intake 1850.91 ml  Output 200 ml  Net 1650.91 ml   Filed Weights   11/07/21 2050 11/08/21 2021 11/09/21 0500  Weight: 69.7 kg 66.7 kg 67.8 kg    Examination: General appearance: 71 y.o., female, NAD, conversant  Eyes: PERRL, tracking appropriately HENT: NCAT; MMM Lungs: CTAB, no crackles, no wheeze, with normal respiratory effort CV: RRR, no murmur  Abdomen: Soft, non-tender; non-distended, BS present  Extremities: No peripheral edema, warm Skin: Normal turgor and texture; no rash Psych: Appropriate affect Neuro: Alert and oriented to person and place, no focal deficit    S Cr 0.48 Hb stable  CTA 1/20- No change in descending AAA  Resolved Hospital Problem list     Assessment & Plan:  Aortic  Dissection: type B; extends into the infrarenal abdominal aorta and right common iliac artery HTN HX HLD Hx of tobacco abuse Per med review, did not get 1500 PO hydralazine or PRN hydralazine on floor P: -Vascular surgery following, tentatively planning for TEVAR on 1/23 per notes -Goal SBP <120, HR less than 80, transfer to ICU, continue nicardipine -PRN dilaudid and tylenol for pain -ongoing smoking cessation education -continue home statin -stop metoprolol, start coreg 12.5 BID -continue hydralazine 10 TID  Leukocytosis:  Suspect reactive due to above. WBC 11.9>11.9 P: -Trend WBC/Fever  curve  Nausea Suspect secondary to aortic dissection -PRN zofran and compazine  AKI- Improved Creat 1.04>0.48 P: -Ensure renal perfusion. Goal MAP 65 or greater. -Avoid neprotoxic drugs as possible. -Strict I&O's -bmet in process  Subclinical Hyperthyroidism Hx of Hyperthyroidism and multinodular goiter P: -no home meds listed -will need follow up outpatient  Hx of pulmonary nodules: 1/18 CT chest 5 mm nodule; same size as CT 2016 P: -will need follow up outpatient   Best Practice (right click and "Reselect all SmartList Selections" daily)   Diet/type: Regular consistency (see orders) DVT prophylaxis: SCD GI prophylaxis: N/A Lines: N/A Foley:  N/A Code Status:  full code Last date of multidisciplinary goals of care discussion [ pt updated at bedside this AM )  Critical care time: 34 minutes     Darlene Griffith  Please see [email or web address] for pager details  If no response, please call [phone] After hours, please call Elink at [phone]

## 2021-11-09 NOTE — Progress Notes (Signed)
° °  VASCULAR SURGERY ASSESSMENT & PLAN:   TYPE B AORTIC DISSECTION: BP under better control. Pain is unchanged. Plan TEVAR Monday. Updated family at bedside.    SUBJECTIVE:   Still with some CP. No change  PHYSICAL EXAM:   Vitals:   11/09/21 0718 11/09/21 0757 11/09/21 0800 11/09/21 1115  BP:  118/62 116/77   Pulse:  80 86   Resp:   18   Temp: 98.7 F (37.1 C)   98.4 F (36.9 C)  TempSrc: Oral   Oral  SpO2:   95%   Weight:      Height:       Lungs clear Palp DP pulses  LABS:   Lab Results  Component Value Date   WBC 13.6 (H) 11/09/2021   HGB 10.6 (L) 11/09/2021   HCT 33.7 (L) 11/09/2021   MCV 106.6 (H) 11/09/2021   PLT 195 11/09/2021   Lab Results  Component Value Date   CREATININE 0.45 11/09/2021    CBG (last 3)  Recent Labs    11/06/21 2004  GLUCAP 130*    PROBLEM LIST:    Principal Problem:   Aortic dissection (HCC) Active Problems:   Hypertension   Hyperthyroidism   AKI (acute kidney injury) ([place])   COPD (chronic obstructive pulmonary disease) (HCC)   CURRENT MEDS:    acetaminophen  650 mg Oral Q6H   atorvastatin  40 mg Oral Daily   carvedilol  12.5 mg Oral BID WC   Chlorhexidine Gluconate Cloth  6 each Topical Daily   hydrALAZINE  10 mg Oral TID   polyethylene glycol  17 g Oral Daily   senna-docusate  1 tablet Oral BID    Deitra Mayo Office: [phone] 11/09/2021

## 2021-11-10 DIAGNOSIS — I10 Essential (primary) hypertension: Secondary | ICD-10-CM | POA: Diagnosis not present

## 2021-11-10 LAB — CBC
HCT: 33.2 % — ABNORMAL LOW (ref 36.0–46.0)
Hemoglobin: 10.8 g/dL — ABNORMAL LOW (ref 12.0–15.0)
MCH: 34.4 pg — ABNORMAL HIGH (ref 26.0–34.0)
MCHC: 32.5 g/dL (ref 30.0–36.0)
MCV: 105.7 fL — ABNORMAL HIGH (ref 80.0–100.0)
Platelets: 186 10*3/uL (ref 150–400)
RBC: 3.14 MIL/uL — ABNORMAL LOW (ref 3.87–5.11)
RDW: 11.4 % — ABNORMAL LOW (ref 11.5–15.5)
WBC: 11.8 10*3/uL — ABNORMAL HIGH (ref 4.0–10.5)
nRBC: 0 % (ref 0.0–0.2)

## 2021-11-10 LAB — BASIC METABOLIC PANEL
Anion gap: 9 (ref 5–15)
BUN: 8 mg/dL (ref 8–23)
CO2: 24 mmol/L (ref 22–32)
Calcium: 8.6 mg/dL — ABNORMAL LOW (ref 8.9–10.3)
Chloride: 104 mmol/L (ref 98–111)
Creatinine, Ser: 0.5 mg/dL (ref 0.44–1.00)
GFR, Estimated: >60 mL/min (ref >60)
Glucose, Bld: 97 mg/dL (ref 70–99)
Potassium: 3.6 mmol/L (ref 3.5–5.1)
Sodium: 137 mmol/L (ref 135–145)

## 2021-11-10 MED ORDER — CARVEDILOL 25 MG PO TABS
25.0000 mg | ORAL_TABLET | Freq: Two times a day (BID) | ORAL | Status: DC
  Administered 2021-11-10: 25 mg via ORAL
  Filled 2021-11-10: qty 1

## 2021-11-10 MED ORDER — POTASSIUM CHLORIDE CRYS ER 20 MEQ PO TBCR
40.0000 meq | EXTENDED_RELEASE_TABLET | Freq: Once | ORAL | Status: AC
  Administered 2021-11-10: 40 meq via ORAL
  Filled 2021-11-10: qty 2

## 2021-11-10 MED ORDER — TRAMADOL HCL 50 MG PO TABS
50.0000 mg | ORAL_TABLET | Freq: Four times a day (QID) | ORAL | Status: DC | PRN
  Administered 2021-11-10 – 2021-11-11 (×2): 50 mg via ORAL
  Filled 2021-11-10 (×2): qty 1

## 2021-11-10 NOTE — Anesthesia Preprocedure Evaluation (Addendum)
Anesthesia Evaluation  Patient identified by MRN, date of birth, ID band Patient awake    Reviewed: Allergy & Precautions, NPO status , Patient's Chart, lab work & pertinent test results  Airway Mallampati: II  TM Distance: >3 FB Neck ROM: Full    Dental no notable dental hx. (+) Dental Advisory Given   Pulmonary shortness of breath, COPD, Current Smoker,    Pulmonary exam normal breath sounds clear to auscultation       Cardiovascular hypertension, Pt. on home beta blockers and Pt. on medications Normal cardiovascular exam+ Valvular Problems/Murmurs AI and MR  Rhythm:Regular Rate:Normal  Echo 2016 - Left ventricle: The cavity size was normal. Systolic function wasnormal. Wall motion was normal; there were no regional wall motion abnormalities. Doppler parameters are consistent withabnormal left ventricular relaxation (grade 1 diastolicdysfunction).  - Aortic valve: There was trivial regurgitation. Valve area (VTI): 2.43 cm^2. Valve area (Vmax): 2.1 cm^2. Valve area (Vmean): 2.22 cm^2.  - Mitral valve: There was mild regurgitation.    Neuro/Psych  Neuromuscular disease    GI/Hepatic Neg liver ROS, GERD  ,  Endo/Other  Hyperthyroidism   Renal/GU Renal disease     Musculoskeletal  (+) Arthritis ,   Abdominal   Peds  Hematology negative hematology ROS (+)   Anesthesia Other Findings   Reproductive/Obstetrics                          Anesthesia Physical Anesthesia Plan  ASA: 4  Anesthesia Plan: General   Post-op Pain Management: Minimal or no pain anticipated and Tylenol PO (pre-op)   Induction: Intravenous  PONV Risk Score and Plan: 2 and Treatment may vary due to age or medical condition, Ondansetron, Dexamethasone and Midazolam  Airway Management Planned: Oral ETT  Additional Equipment: Arterial line and CVP  Intra-op Plan:   Post-operative Plan: Extubation in OR  Informed  Consent: I have reviewed the patients History and Physical, chart, labs and discussed the procedure including the risks, benefits and alternatives for the proposed anesthesia with the patient or authorized representative who has indicated his/her understanding and acceptance.     Dental advisory given  Plan Discussed with: CRNA  Anesthesia Plan Comments: (Spinal drain?)     Anesthesia Quick Evaluation

## 2021-11-10 NOTE — Progress Notes (Signed)
NAME:  Darlene Griffith, MRN:  [id number], DOB:  1951-05-27, LOS: 4 ADMISSION DATE:  11/06/2021, CONSULTATION DATE:  1/18 REFERRING MD:  Dr. Vanita Panda, CHIEF COMPLAINT:  Aortic dissection   History of Present Illness:  Patient is a 71 yo F w/ pertinent PMH of HTN, HLD, Hyperthyroidism, multinodular goiter present to [facility] ED on 1/18 with chest pain.  On 1/18, patient states she had acute onset chest pain  in upper back that radiates to her chest. EMS called. She states she has not been taking her home BP meds regularly. Arrived to [facility] ED. BP initially 136/76. Complaining of chest pain. EKG w/ sinus rhythm. Troponin and BNP WNL. CTA shows type B aortic dissection that extends into the infrarenal abdominal aorta and right common iliac artery. D-dimer 4.78. Started on Esmolol drip. Vascular surgery consulted. On 1/19 she was off all drips. Transferred to progressive and TRH on 1/20.  The evening of 1/20 the patient became increasingly hypertensive with some chest pain. Vascular surgery started the patient on cardine GTT. Transfer to ICU. PCCM consulted  Pertinent  Medical History   Past Medical History:  Diagnosis Date   Chest pain    a. 01/2015 Echo: Nl LV fxn, Gr 1 DD, triv AI, mild MR.   Essential hypertension    Family history of lung cancer    Hepatic cyst    a. noted on CT 01/2015.   Hyperthyroidism    GOING TO [facility] FOR SECOND OPINION   Multinodular goiter    a. 01/2015 CT chest: multinodular goidter w/ substernal extension of the left lobe of the thyroid assoc w/ rightward deviation of tracheal air column.   Neck pain, chronic    Personal history of colonic polyps    Pulmonary nodules    a. 01/2015 CT Chest: RLL ~ 67mm subpleural nodule - rec f/u in 6-12 mos.   Splenic cyst    a. noted on CT 01/2015.     Significant Hospital Events: Including procedures, antibiotic start and stop dates in addition to other pertinent events   1/18: admitted to [facility]; aortic dissection; started on esmolol  drip  1/20: CTA chest  No change in descending AAA, HTN, started on cardiene GTT, TRH placed ICU transfer orders and PCCM consult  Interim History / Subjective:  Still having CP, abdominal pain - no other acute issues overnight. Cardene weaned off.  Objective   Blood pressure 138/62, pulse 75, temperature 99.3 F (37.4 C), temperature source Oral, resp. rate 11, height 5\' 1"  (1.549 m), weight 67.8 kg, SpO2 93 %.        Intake/Output Summary (Last 24 hours) at 11/10/2021 0915 Last data filed at 11/09/2021 1752 Gross per 24 hour  Intake 523.93 ml  Output 400 ml  Net 123.93 ml   Filed Weights   11/07/21 2050 11/08/21 2021 11/09/21 0500  Weight: 69.7 kg 66.7 kg 67.8 kg    Examination: General appearance: 71 y.o., female, NAD Eyes: PERRL, tracking appropriately HENT: MMM Lungs: diminished bilaterally, no crackles, no wheeze, with normal respiratory effort CV: RRR, no murmur  Abdomen: Soft, non-tender; non-distended, BS present  Extremities: No peripheral edema, warm Skin: Normal turgor and texture; no rash Psych: Appropriate affect Neuro: Alert and oriented x3, no focal deficit    S Cr 0.5 Hb stable  CTA 1/20- No change in descending AAA  Resolved Hospital Problem list     Assessment & Plan:  Aortic Dissection: type B; extends into the infrarenal abdominal aorta and right  common iliac artery HTN HX HLD Hx of tobacco abuse Per med review, did not get 1500 PO hydralazine or PRN hydralazine on floor P: -Vascular surgery following, tentatively planning for TEVAR tomorrow given persistent CP -Goal SBP <120, HR less than 80 -PRN dilaudid and tylenol for pain -ongoing smoking cessation education -continue home statin -increase coreg to 25 BID -continue hydralazine 10 TID  Leukocytosis:  Suspect reactive due to above. WBC 11.9>11.9 P: -Trend WBC/Fever curve  Nausea Suspect secondary to aortic dissection -PRN zofran and compazine  AKI- Improved Creat  1.04>0.5 P: -Ensure renal perfusion. Goal MAP 65 or greater. -Avoid neprotoxic drugs as possible. -Strict I&O's -bmet in process  Subclinical Hyperthyroidism Hx of Hyperthyroidism and multinodular goiter P: -no home meds listed -will need follow up outpatient  Hx of pulmonary nodules: 1/18 CT chest 5 mm nodule; same size as CT 2016 P: -will need follow up outpatient   Best Practice (right click and "Reselect all SmartList Selections" daily)   Diet/type: Regular consistency (see orders) DVT prophylaxis: SCD GI prophylaxis: N/A Lines: N/A Foley:  N/A Code Status:  full code Last date of multidisciplinary goals of care discussion [ pt updated at bedside this AM )       [place]  Please see [email or web address] for pager details  If no response, please call [phone] After hours, please call Elink at [phone]

## 2021-11-10 NOTE — Plan of Care (Signed)
  Problem: Clinical Measurements: Goal: Will remain free from infection Outcome: Progressing Goal: Respiratory complications will improve Outcome: Progressing Goal: Cardiovascular complication will be avoided Outcome: Progressing   

## 2021-11-10 NOTE — Progress Notes (Addendum)
° °  VASCULAR SURGERY ASSESSMENT & PLAN:   TYPE B AORTIC DISSECTION: The patient is continuing to have some abdominal pain and left shoulder pain.  Her blood pressure is under better control.  She has palpable pedal pulses and good renal function with a normal creatinine.  Given the persistent symptoms however Dr. Stanford Breed plans on elective repair tomorrow.  I have discussed the procedure with the patient today.  He has previously discussed in detail the potential risks involved.  I have written preop orders for tomorrow.    SUBJECTIVE:   No significant change in her abdominal pain and occasional left shoulder pain.  PHYSICAL EXAM:   Vitals:   11/10/21 0700 11/10/21 0735 11/10/21 0800 11/10/21 0910  BP: 135/65 126/70 135/65 138/62  Pulse: 73 77 75   Resp: 11  11   Temp: 99.3 F (37.4 C)     TempSrc: Oral     SpO2: 92%  93%   Weight:      Height:       Palpable pedal pulses  LABS:   Lab Results  Component Value Date   WBC 11.8 (H) 11/10/2021   HGB 10.8 (L) 11/10/2021   HCT 33.2 (L) 11/10/2021   MCV 105.7 (H) 11/10/2021   PLT 186 11/10/2021   Lab Results  Component Value Date   CREATININE 0.50 11/10/2021   Lab Results  Component Value Date   INR 1.0 11/06/2021   CBG (last 3)  No results for input(s): GLUCAP in the last 72 hours.  PROBLEM LIST:    Principal Problem:   Aortic dissection (HCC) Active Problems:   Hypertension   Hyperthyroidism   AKI (acute kidney injury) ([place])   COPD (chronic obstructive pulmonary disease) (HCC)   CURRENT MEDS:    acetaminophen  650 mg Oral Q6H   atorvastatin  40 mg Oral Daily   carvedilol  12.5 mg Oral BID WC   Chlorhexidine Gluconate Cloth  6 each Topical Daily   hydrALAZINE  10 mg Oral TID   polyethylene glycol  17 g Oral Daily   senna-docusate  1 tablet Oral BID    Deitra Mayo Office: [phone] 11/10/2021

## 2021-11-10 NOTE — Progress Notes (Signed)
Pt expressed desire to speak to Dr.  Before surgery in the am. Consent by bedside and not signed until Dr. Mariana Arn to her  Chrisandra Carota, RN 11/10/2021 7:26 PM

## 2021-11-10 NOTE — Progress Notes (Signed)
Pt arrived from ICU, VSS, CHG complete. Orders checked    Chrisandra Carota, RN 11/10/2021 4:51 PM

## 2021-11-11 ENCOUNTER — Inpatient Hospital Stay (HOSPITAL_COMMUNITY): Payer: BC Managed Care – PPO

## 2021-11-11 ENCOUNTER — Inpatient Hospital Stay (HOSPITAL_COMMUNITY): Payer: BC Managed Care – PPO | Admitting: Anesthesiology

## 2021-11-11 ENCOUNTER — Encounter (HOSPITAL_COMMUNITY)
Admission: EM | Disposition: A | Discharge status: Rehabilitation Facility | Payer: Self-pay | Source: Home / Self Care | Attending: Vascular Surgery

## 2021-11-11 ENCOUNTER — Other Ambulatory Visit: Payer: Self-pay

## 2021-11-11 DIAGNOSIS — I9751 Accidental puncture and laceration of a circulatory system organ or structure during a circulatory system procedure: Secondary | ICD-10-CM

## 2021-11-11 DIAGNOSIS — I7100 Dissection of unspecified site of aorta: Secondary | ICD-10-CM | POA: Diagnosis not present

## 2021-11-11 HISTORY — PX: THORACIC AORTIC ENDOVASCULAR STENT GRAFT: SHX6112

## 2021-11-11 HISTORY — PX: ULTRASOUND GUIDANCE FOR VASCULAR ACCESS: SHX6516

## 2021-11-11 HISTORY — PX: BYPASS GRAFT AORTA TO AORTA: SHX6294

## 2021-11-11 LAB — CBC
HCT: 34.2 % — ABNORMAL LOW (ref 36.0–46.0)
HCT: 39.8 % (ref 36.0–46.0)
HCT: 37.8 % (ref 36.0–46.0)
Hemoglobin: 11.3 g/dL — ABNORMAL LOW (ref 12.0–15.0)
Hemoglobin: 13.6 g/dL (ref 12.0–15.0)
Hemoglobin: 13 g/dL (ref 12.0–15.0)
MCH: 34.2 pg — ABNORMAL HIGH (ref 26.0–34.0)
MCH: 30.5 pg (ref 26.0–34.0)
MCH: 30.6 pg (ref 26.0–34.0)
MCHC: 33 g/dL (ref 30.0–36.0)
MCHC: 34.2 g/dL (ref 30.0–36.0)
MCHC: 34.4 g/dL (ref 30.0–36.0)
MCV: 103.6 fL — ABNORMAL HIGH (ref 80.0–100.0)
MCV: 89.2 fL (ref 80.0–100.0)
MCV: 88.9 fL (ref 80.0–100.0)
Platelets: 235 10*3/uL (ref 150–400)
Platelets: 98 10*3/uL — ABNORMAL LOW (ref 150–400)
Platelets: 107 10*3/uL — ABNORMAL LOW (ref 150–400)
RBC: 3.3 MIL/uL — ABNORMAL LOW (ref 3.87–5.11)
RBC: 4.46 MIL/uL (ref 3.87–5.11)
RBC: 4.25 MIL/uL (ref 3.87–5.11)
RDW: 11.2 % — ABNORMAL LOW (ref 11.5–15.5)
RDW: 15.2 % (ref 11.5–15.5)
RDW: 15.3 % (ref 11.5–15.5)
WBC: 10.1 10*3/uL (ref 4.0–10.5)
WBC: 9.3 10*3/uL (ref 4.0–10.5)
WBC: 13.2 10*3/uL — ABNORMAL HIGH (ref 4.0–10.5)
nRBC: 0 % (ref 0.0–0.2)
nRBC: 0 % (ref 0.0–0.2)
nRBC: 0 % (ref 0.0–0.2)

## 2021-11-11 LAB — POCT ACTIVATED CLOTTING TIME
Activated Clotting Time: 257 seconds
Activated Clotting Time: 239 seconds
Activated Clotting Time: 263 seconds
Activated Clotting Time: 215 seconds

## 2021-11-11 LAB — POCT I-STAT 7, (LYTES, BLD GAS, ICA,H+H)
Acid-Base Excess: 5 mmol/L — ABNORMAL HIGH (ref 0.0–2.0)
Acid-Base Excess: 9 mmol/L — ABNORMAL HIGH (ref 0.0–2.0)
Acid-Base Excess: 7 mmol/L — ABNORMAL HIGH (ref 0.0–2.0)
Acid-base deficit: 14 mmol/L — ABNORMAL HIGH (ref 0.0–2.0)
Bicarbonate: 14.6 mmol/L — ABNORMAL LOW (ref 20.0–28.0)
Bicarbonate: 31.7 mmol/L — ABNORMAL HIGH (ref 20.0–28.0)
Bicarbonate: 34.1 mmol/L — ABNORMAL HIGH (ref 20.0–28.0)
Bicarbonate: 32.5 mmol/L — ABNORMAL HIGH (ref 20.0–28.0)
Calcium, Ion: 1.17 mmol/L (ref 1.15–1.40)
Calcium, Ion: 0.89 mmol/L — CL (ref 1.15–1.40)
Calcium, Ion: 1.52 mmol/L (ref 1.15–1.40)
Calcium, Ion: 1.32 mmol/L (ref 1.15–1.40)
HCT: 19 % — ABNORMAL LOW (ref 36.0–46.0)
HCT: 18 % — ABNORMAL LOW (ref 36.0–46.0)
HCT: 28 % — ABNORMAL LOW (ref 36.0–46.0)
HCT: 39 % (ref 36.0–46.0)
Hemoglobin: 6.5 g/dL — CL (ref 12.0–15.0)
Hemoglobin: 6.1 g/dL — CL (ref 12.0–15.0)
Hemoglobin: 9.5 g/dL — ABNORMAL LOW (ref 12.0–15.0)
Hemoglobin: 13.3 g/dL (ref 12.0–15.0)
O2 Saturation: 100 %
O2 Saturation: 100 %
O2 Saturation: 100 %
O2 Saturation: 100 %
Patient temperature: 97.8
Potassium: 4.9 mmol/L (ref 3.5–5.1)
Potassium: 3.4 mmol/L — ABNORMAL LOW (ref 3.5–5.1)
Potassium: 3.5 mmol/L (ref 3.5–5.1)
Potassium: 3.4 mmol/L — ABNORMAL LOW (ref 3.5–5.1)
Sodium: 140 mmol/L (ref 135–145)
Sodium: 142 mmol/L (ref 135–145)
Sodium: 145 mmol/L (ref 135–145)
Sodium: 141 mmol/L (ref 135–145)
TCO2: 16 mmol/L — ABNORMAL LOW (ref 22–32)
TCO2: 33 mmol/L — ABNORMAL HIGH (ref 22–32)
TCO2: 36 mmol/L — ABNORMAL HIGH (ref 22–32)
TCO2: 34 mmol/L — ABNORMAL HIGH (ref 22–32)
pCO2 arterial: 51 mmHg — ABNORMAL HIGH (ref 32.0–48.0)
pCO2 arterial: 48.9 mmHg — ABNORMAL HIGH (ref 32.0–48.0)
pCO2 arterial: 55.2 mmHg — ABNORMAL HIGH (ref 32.0–48.0)
pCO2 arterial: 49.6 mmHg — ABNORMAL HIGH (ref 32.0–48.0)
pH, Arterial: 7.065 — CL (ref 7.350–7.450)
pH, Arterial: 7.368 (ref 7.350–7.450)
pH, Arterial: 7.452 — ABNORMAL HIGH (ref 7.350–7.450)
pH, Arterial: 7.423 (ref 7.350–7.450)
pO2, Arterial: 305 mmHg — ABNORMAL HIGH (ref 83.0–108.0)
pO2, Arterial: 311 mmHg — ABNORMAL HIGH (ref 83.0–108.0)
pO2, Arterial: 412 mmHg — ABNORMAL HIGH (ref 83.0–108.0)
pO2, Arterial: 394 mmHg — ABNORMAL HIGH (ref 83.0–108.0)

## 2021-11-11 LAB — PREPARE RBC (CROSSMATCH)

## 2021-11-11 LAB — BASIC METABOLIC PANEL
Anion gap: 8 (ref 5–15)
Anion gap: 7 (ref 5–15)
BUN: 8 mg/dL (ref 8–23)
BUN: 6 mg/dL — ABNORMAL LOW (ref 8–23)
CO2: 26 mmol/L (ref 22–32)
CO2: 29 mmol/L (ref 22–32)
Calcium: 8.8 mg/dL — ABNORMAL LOW (ref 8.9–10.3)
Calcium: 8.8 mg/dL — ABNORMAL LOW (ref 8.9–10.3)
Chloride: 104 mmol/L (ref 98–111)
Chloride: 104 mmol/L (ref 98–111)
Creatinine, Ser: 0.45 mg/dL (ref 0.44–1.00)
Creatinine, Ser: 0.41 mg/dL — ABNORMAL LOW (ref 0.44–1.00)
GFR, Estimated: >60 mL/min (ref >60)
GFR, Estimated: >60 mL/min (ref >60)
Glucose, Bld: 95 mg/dL (ref 70–99)
Glucose, Bld: 187 mg/dL — ABNORMAL HIGH (ref 70–99)
Potassium: 3.6 mmol/L (ref 3.5–5.1)
Potassium: 3.4 mmol/L — ABNORMAL LOW (ref 3.5–5.1)
Sodium: 138 mmol/L (ref 135–145)
Sodium: 140 mmol/L (ref 135–145)

## 2021-11-11 LAB — PROTIME-INR
INR: 1.1 (ref 0.8–1.2)
INR: 1.1 (ref 0.8–1.2)
Prothrombin Time: 13.8 seconds (ref 11.4–15.2)
Prothrombin Time: 14.4 seconds (ref 11.4–15.2)

## 2021-11-11 LAB — GLUCOSE, CAPILLARY
Glucose-Capillary: 179 mg/dL — ABNORMAL HIGH (ref 70–99)
Glucose-Capillary: 151 mg/dL — ABNORMAL HIGH (ref 70–99)
Glucose-Capillary: 111 mg/dL — ABNORMAL HIGH (ref 70–99)

## 2021-11-11 LAB — FIBRINOGEN: Fibrinogen: 415 mg/dL (ref 210–475)

## 2021-11-11 LAB — LACTIC ACID, PLASMA
Lactic Acid, Venous: 1.3 mmol/L (ref 0.5–1.9)
Lactic Acid, Venous: 0.9 mmol/L (ref 0.5–1.9)

## 2021-11-11 LAB — MAGNESIUM: Magnesium: 1.4 mg/dL — ABNORMAL LOW (ref 1.7–2.4)

## 2021-11-11 LAB — MASSIVE TRANSFUSION PROTOCOL ORDER (BLOOD BANK NOTIFICATION)

## 2021-11-11 SURGERY — INSERTION, ENDOVASCULAR STENT GRAFT, AORTA, THORACIC
Anesthesia: General | Site: Groin | Laterality: Right

## 2021-11-11 MED ORDER — ACETAMINOPHEN 325 MG PO TABS
650.0000 mg | ORAL_TABLET | Freq: Four times a day (QID) | ORAL | Status: DC
  Administered 2021-11-11 – 2021-11-12 (×3): 650 mg
  Filled 2021-11-11 (×3): qty 2

## 2021-11-11 MED ORDER — DEXAMETHASONE SODIUM PHOSPHATE 10 MG/ML IJ SOLN
INTRAMUSCULAR | Status: DC | PRN
  Administered 2021-11-11: 8 mg via INTRAVENOUS

## 2021-11-11 MED ORDER — LACTATED RINGERS IV SOLN: INTRAVENOUS | Status: DC

## 2021-11-11 MED ORDER — CEFAZOLIN SODIUM-DEXTROSE 2-3 GM-%(50ML) IV SOLR
INTRAVENOUS | Status: DC | PRN
  Administered 2021-11-11 (×2): 2 g via INTRAVENOUS

## 2021-11-11 MED ORDER — CALCIUM CHLORIDE 10 % IV SOLN
INTRAVENOUS | Status: DC | PRN
  Administered 2021-11-11 (×2): 1 g via INTRAVENOUS
  Administered 2021-11-11: .5 g via INTRAVENOUS
  Administered 2021-11-11: 1 g via INTRAVENOUS

## 2021-11-11 MED ORDER — NOREPINEPHRINE 4 MG/250ML-% IV SOLN: 0.0000 ug/min | INTRAVENOUS | Status: DC

## 2021-11-11 MED ORDER — IODIXANOL 320 MG/ML IV SOLN
INTRAVENOUS | Status: DC | PRN
  Administered 2021-11-11: 125 mL

## 2021-11-11 MED ORDER — SODIUM CHLORIDE 0.9 % IV SOLN: INTRAVENOUS | Status: DC | PRN

## 2021-11-11 MED ORDER — INSULIN ASPART 100 UNIT/ML IJ SOLN
0.0000 [IU] | INTRAMUSCULAR | Status: DC
  Administered 2021-11-11: 2 [IU] via SUBCUTANEOUS
  Administered 2021-11-13 – 2021-11-19 (×4): 1 [IU] via SUBCUTANEOUS
  Administered 2021-11-20: 3 [IU] via SUBCUTANEOUS

## 2021-11-11 MED ORDER — PROPOFOL 1000 MG/100ML IV EMUL
5.0000 ug/kg/min | INTRAVENOUS | Status: DC
  Administered 2021-11-11: 20 ug/kg/min via INTRAVENOUS
  Administered 2021-11-11: 25 ug/kg/min via INTRAVENOUS
  Administered 2021-11-12: 08:00:00 30 ug/kg/min via INTRAVENOUS
  Administered 2021-11-12: 02:00:00 40 ug/kg/min via INTRAVENOUS
  Filled 2021-11-11 (×3): qty 100

## 2021-11-11 MED ORDER — PHENYLEPHRINE HCL-NACL 20-0.9 MG/250ML-% IV SOLN
INTRAVENOUS | Status: DC | PRN
  Administered 2021-11-11: 20 ug/min via INTRAVENOUS

## 2021-11-11 MED ORDER — HEPARIN 6000 UNIT IRRIGATION SOLUTION
Status: DC | PRN
  Administered 2021-11-11 (×3): 1

## 2021-11-11 MED ORDER — ATORVASTATIN CALCIUM 40 MG PO TABS
40.0000 mg | ORAL_TABLET | Freq: Every day | ORAL | Status: DC
  Administered 2021-11-12: 10:00:00 40 mg
  Filled 2021-11-11: qty 1

## 2021-11-11 MED ORDER — ORAL CARE MOUTH RINSE
15.0000 mL | OROMUCOSAL | Status: DC
  Administered 2021-11-11 – 2021-11-12 (×8): 15 mL via OROMUCOSAL

## 2021-11-11 MED ORDER — CLEVIDIPINE BUTYRATE 0.5 MG/ML IV EMUL
0.0000 mg/h | INTRAVENOUS | Status: DC
  Administered 2021-11-12: 09:00:00 3 mg/h via INTRAVENOUS
  Filled 2021-11-11: qty 50

## 2021-11-11 MED ORDER — EPINEPHRINE 1 MG/10ML IJ SOSY
PREFILLED_SYRINGE | INTRAMUSCULAR | Status: DC | PRN
  Administered 2021-11-11 (×2): 1 mg via INTRAVENOUS

## 2021-11-11 MED ORDER — CHLORHEXIDINE GLUCONATE 0.12% ORAL RINSE (MEDLINE KIT)
15.0000 mL | Freq: Two times a day (BID) | OROMUCOSAL | Status: DC
  Administered 2021-11-11 – 2021-11-12 (×2): 15 mL via OROMUCOSAL

## 2021-11-11 MED ORDER — PROPOFOL 10 MG/ML IV BOLUS
INTRAVENOUS | Status: DC | PRN
  Administered 2021-11-11: 100 mg via INTRAVENOUS

## 2021-11-11 MED ORDER — PHENYLEPHRINE 40 MCG/ML (10ML) SYRINGE FOR IV PUSH (FOR BLOOD PRESSURE SUPPORT)
PREFILLED_SYRINGE | INTRAVENOUS | Status: DC | PRN
  Administered 2021-11-11: 80 ug via INTRAVENOUS
  Administered 2021-11-11: 120 ug via INTRAVENOUS
  Administered 2021-11-11: 40 ug via INTRAVENOUS

## 2021-11-11 MED ORDER — HEPARIN 6000 UNIT IRRIGATION SOLUTION
Status: AC
  Filled 2021-11-11: qty 500

## 2021-11-11 MED ORDER — VASOPRESSIN 20 UNIT/ML IV SOLN
INTRAVENOUS | Status: DC | PRN
  Administered 2021-11-11: 3 [IU] via INTRAVENOUS
  Administered 2021-11-11: 2 [IU] via INTRAVENOUS

## 2021-11-11 MED ORDER — CLEVIDIPINE BUTYRATE 0.5 MG/ML IV EMUL
0.0000 mg/h | INTRAVENOUS | Status: AC
  Administered 2021-11-11: 10 mg/h via INTRAVENOUS
  Filled 2021-11-11: qty 50

## 2021-11-11 MED ORDER — ALBUMIN HUMAN 5 % IV SOLN: INTRAVENOUS | Status: DC | PRN

## 2021-11-11 MED ORDER — SODIUM BICARBONATE 8.4 % IV SOLN
INTRAVENOUS | Status: DC | PRN
  Administered 2021-11-11 (×3): 50 meq via INTRAVENOUS

## 2021-11-11 MED ORDER — LACTATED RINGERS IV SOLN: INTRAVENOUS | Status: DC | PRN

## 2021-11-11 MED ORDER — PROPOFOL 500 MG/50ML IV EMUL
INTRAVENOUS | Status: DC | PRN
  Administered 2021-11-11: 25 ug/kg/min via INTRAVENOUS

## 2021-11-11 MED ORDER — LIDOCAINE 2% (20 MG/ML) 5 ML SYRINGE
INTRAMUSCULAR | Status: DC | PRN
  Administered 2021-11-11: 50 mg via INTRAVENOUS

## 2021-11-11 MED ORDER — SODIUM CHLORIDE 0.9% FLUSH
10.0000 mL | Freq: Two times a day (BID) | INTRAVENOUS | Status: DC
  Administered 2021-11-11 – 2021-11-13 (×5): 10 mL

## 2021-11-11 MED ORDER — POTASSIUM CHLORIDE 10 MEQ/50ML IV SOLN
10.0000 meq | INTRAVENOUS | Status: AC
  Administered 2021-11-11 (×3): 10 meq via INTRAVENOUS
  Filled 2021-11-11 (×3): qty 50

## 2021-11-11 MED ORDER — 0.9 % SODIUM CHLORIDE (POUR BTL) OPTIME
TOPICAL | Status: DC | PRN
  Administered 2021-11-11: 1000 mL

## 2021-11-11 MED ORDER — CEFAZOLIN SODIUM-DEXTROSE 1-4 GM/50ML-% IV SOLN
1.0000 g | Freq: Three times a day (TID) | INTRAVENOUS | Status: AC
  Administered 2021-11-11 – 2021-11-12 (×3): 1 g via INTRAVENOUS
  Filled 2021-11-11 (×3): qty 50

## 2021-11-11 MED ORDER — HEPARIN SODIUM (PORCINE) 1000 UNIT/ML IJ SOLN
INTRAMUSCULAR | Status: DC | PRN
  Administered 2021-11-11 (×2): 2000 [IU] via INTRAVENOUS
  Administered 2021-11-11: 7000 [IU] via INTRAVENOUS

## 2021-11-11 MED ORDER — ROCURONIUM BROMIDE 10 MG/ML (PF) SYRINGE
PREFILLED_SYRINGE | INTRAVENOUS | Status: DC | PRN
  Administered 2021-11-11: 20 mg via INTRAVENOUS
  Administered 2021-11-11: 60 mg via INTRAVENOUS
  Administered 2021-11-11: 70 mg via INTRAVENOUS
  Administered 2021-11-11: 40 mg via INTRAVENOUS
  Administered 2021-11-11: 20 mg via INTRAVENOUS

## 2021-11-11 MED ORDER — CARVEDILOL 25 MG PO TABS
25.0000 mg | ORAL_TABLET | Freq: Two times a day (BID) | ORAL | Status: DC
  Administered 2021-11-12: 08:00:00 25 mg
  Filled 2021-11-11: qty 1

## 2021-11-11 MED ORDER — SUCCINYLCHOLINE CHLORIDE 200 MG/10ML IV SOSY
PREFILLED_SYRINGE | INTRAVENOUS | Status: DC | PRN
  Administered 2021-11-11: 80 mg via INTRAVENOUS

## 2021-11-11 MED ORDER — SODIUM CHLORIDE 0.9% FLUSH: 10.0000 mL | INTRAVENOUS | Status: DC | PRN

## 2021-11-11 MED ORDER — MIDAZOLAM HCL 2 MG/2ML IJ SOLN
INTRAMUSCULAR | Status: DC | PRN
  Administered 2021-11-11 (×2): 1 mg via INTRAVENOUS

## 2021-11-11 MED ORDER — SENNOSIDES 8.8 MG/5ML PO SYRP
5.0000 mL | ORAL_SOLUTION | Freq: Two times a day (BID) | ORAL | Status: DC
  Administered 2021-11-11 – 2021-11-15 (×8): 5 mL via ORAL
  Filled 2021-11-11 (×8): qty 5

## 2021-11-11 MED ORDER — PANTOPRAZOLE SODIUM 40 MG IV SOLR
40.0000 mg | Freq: Every day | INTRAVENOUS | Status: DC
  Administered 2021-11-11 – 2021-11-12 (×2): 40 mg via INTRAVENOUS
  Filled 2021-11-11 (×2): qty 40

## 2021-11-11 MED ORDER — FENTANYL CITRATE (PF) 250 MCG/5ML IJ SOLN
INTRAMUSCULAR | Status: DC | PRN
  Administered 2021-11-11: 50 ug via INTRAVENOUS
  Administered 2021-11-11: 25 ug via INTRAVENOUS
  Administered 2021-11-11: 50 ug via INTRAVENOUS
  Administered 2021-11-11: 25 ug via INTRAVENOUS
  Administered 2021-11-11 (×2): 50 ug via INTRAVENOUS

## 2021-11-11 MED ORDER — FENTANYL CITRATE PF 50 MCG/ML IJ SOSY
50.0000 ug | PREFILLED_SYRINGE | INTRAMUSCULAR | Status: DC | PRN
  Administered 2021-11-11 (×2): 50 ug via INTRAVENOUS
  Administered 2021-11-11 (×2): 25 ug via INTRAVENOUS
  Administered 2021-11-11 – 2021-11-12 (×6): 50 ug via INTRAVENOUS
  Administered 2021-11-12: 10:00:00 100 ug via INTRAVENOUS
  Administered 2021-11-12: 11:00:00 50 ug via INTRAVENOUS
  Administered 2021-11-12: 09:00:00 100 ug via INTRAVENOUS
  Filled 2021-11-11: qty 1
  Filled 2021-11-11: qty 2
  Filled 2021-11-11: qty 1
  Filled 2021-11-11: qty 2
  Filled 2021-11-11 (×9): qty 1

## 2021-11-11 SURGICAL SUPPLY — 94 items
ADH SKN CLS APL DERMABOND .7 (GAUZE/BANDAGES/DRESSINGS) ×5
ADH SKN CLS LQ APL DERMABOND (GAUZE/BANDAGES/DRESSINGS) ×5
APL PRP STRL LF DISP 70% ISPRP (MISCELLANEOUS) ×10
BAG COUNTER SPONGE SURGICOUNT (BAG) ×6 IMPLANT
BAG SPNG CNTER NS LX DISP (BAG) ×5
BALLN MUSTANG 10X80X75 (BALLOONS) ×6
BALLN MUSTANG 7.0X40 135 (BALLOONS) ×6
BALLN MUSTANG 8X20X75 (BALLOONS) ×6
BALLOON MUSTANG 10X80X75 (BALLOONS) IMPLANT
BALLOON MUSTANG 7.0X40 135 (BALLOONS) IMPLANT
BALLOON MUSTANG 8X20X75 (BALLOONS) IMPLANT
CANISTER SUCT 3000ML PPV (MISCELLANEOUS) ×6 IMPLANT
CATH ACCU-VU SIZ PIG 5F 100CM (CATHETERS) ×1 IMPLANT
CATH ANGIO 5F BER2 100CM (CATHETERS) ×1 IMPLANT
CATH VISIONS PV .035 IVUS (CATHETERS) ×1 IMPLANT
CHLORAPREP W/TINT 26 (MISCELLANEOUS) ×7 IMPLANT
CLIP LIGATING EXTRA MED SLVR (CLIP) ×1 IMPLANT
CLIP LIGATING EXTRA SM BLUE (MISCELLANEOUS) ×1 IMPLANT
DERMABOND ADHESIVE PROPEN (GAUZE/BANDAGES/DRESSINGS) ×1
DERMABOND ADVANCED (GAUZE/BANDAGES/DRESSINGS) ×1
DERMABOND ADVANCED .7 DNX12 (GAUZE/BANDAGES/DRESSINGS) ×5 IMPLANT
DERMABOND ADVANCED .7 DNX6 (GAUZE/BANDAGES/DRESSINGS) IMPLANT
DEVICE CLOSURE PERCLS PRGLD 6F (VASCULAR PRODUCTS) IMPLANT
DEVICE ENSNARE  12MMX20MM (VASCULAR PRODUCTS) ×6
DEVICE ENSNARE 12MMX20MM (VASCULAR PRODUCTS) IMPLANT
DEVICE TORQUE H2O (MISCELLANEOUS) ×1 IMPLANT
DRAIN CHANNEL 19F RND (DRAIN) ×1 IMPLANT
DRSG COVADERM 4X10 (GAUZE/BANDAGES/DRESSINGS) ×2 IMPLANT
DRSG COVADERM 4X6 (GAUZE/BANDAGES/DRESSINGS) ×1 IMPLANT
DRYSEAL FLEXSHEATH 22FR 33CM (SHEATH) ×3
ELECT CAUTERY BLADE 6.4 (BLADE) ×6 IMPLANT
ELECT REM PT RETURN 9FT ADLT (ELECTROSURGICAL) ×6
ELECTRODE REM PT RTRN 9FT ADLT (ELECTROSURGICAL) ×10 IMPLANT
ENDOPROSTHESIS THORAC 10X8X6 (Endovascular Graft) IMPLANT
ENDOPROSTHESIS THORAC 31X8X15 (Endovascular Graft) IMPLANT
ENDOPROTH THORACIC 10X8X6 (Endovascular Graft) ×6 IMPLANT
ENDOPROTH THORACIC 31X8X15 (Endovascular Graft) ×6 IMPLANT
EVACUATOR SILICONE 100CC (DRAIN) ×1 IMPLANT
GLOVE SURG MICRO LTX SZ6 (GLOVE) ×1 IMPLANT
GLOVE SURG POLYISO LF SZ8 (GLOVE) ×6 IMPLANT
GOWN STRL REUS W/ TWL LRG LVL3 (GOWN DISPOSABLE) ×10 IMPLANT
GOWN STRL REUS W/ TWL XL LVL3 (GOWN DISPOSABLE) ×5 IMPLANT
GOWN STRL REUS W/TWL LRG LVL3 (GOWN DISPOSABLE) ×12
GOWN STRL REUS W/TWL XL LVL3 (GOWN DISPOSABLE) ×6
GRAFT BALLN CATH 65CM (STENTS) IMPLANT
GRAFT PROPATEN W/RING 8X80X70 (Vascular Products) ×1 IMPLANT
GUIDEWIRE ANGLED .035X260CM (WIRE) ×1 IMPLANT
KIT BASIN OR (CUSTOM PROCEDURE TRAY) ×6 IMPLANT
KIT ENCORE 26 ADVANTAGE (KITS) ×1 IMPLANT
KIT TURNOVER KIT B (KITS) ×6 IMPLANT
LOOP VESSEL MAXI BLUE (MISCELLANEOUS) ×1 IMPLANT
LOOP VESSEL MINI RED (MISCELLANEOUS) ×1 IMPLANT
NS IRRIG 1000ML POUR BTL (IV SOLUTION) ×6 IMPLANT
PACK ENDOVASCULAR (PACKS) ×6 IMPLANT
PAD ARMBOARD 7.5X6 YLW CONV (MISCELLANEOUS) ×11 IMPLANT
PENCIL BUTTON HOLSTER BLD 10FT (ELECTRODE) ×6 IMPLANT
PERCLOSE PROGLIDE 6F (VASCULAR PRODUCTS) ×24
SHEATH DRYSEAL FLEX 22FR 33CM (SHEATH) IMPLANT
SHEATH FLEX ANSEL ANG 5F 45CM (SHEATH) ×1 IMPLANT
SHEATH PINNACLE 5F 10CM (SHEATH) ×2 IMPLANT
SHEATH PINNACLE 8F 10CM (SHEATH) ×2 IMPLANT
SLEEVE ISOL F/PACE RF HD COVER (MISCELLANEOUS) ×1 IMPLANT
STAPLER VISISTAT 35W (STAPLE) ×2 IMPLANT
STENT GRAFT BALLN CATH 65CM (STENTS) ×6
STENT GRFT THORAC ACS 31X31X15 (Endovascular Graft) ×1 IMPLANT
STENT VIABAHN 11X79X135 (Permanent Stent) IMPLANT
STENT VIABAHN 11X79X135MM (Permanent Stent) ×12 IMPLANT
STOPCOCK MORSE 400PSI 3WAY (MISCELLANEOUS) ×6 IMPLANT
SUT ETHILON 3 0 PS 1 (SUTURE) ×1 IMPLANT
SUT MNCRL AB 4-0 PS2 18 (SUTURE) ×2 IMPLANT
SUT PDS AB 1 CTX 36 (SUTURE) ×1 IMPLANT
SUT PDS AB 1 TP1 54 (SUTURE) ×1 IMPLANT
SUT PROLENE 5 0 C 1 24 (SUTURE) ×4 IMPLANT
SUT PROLENE 6 0 BV (SUTURE) ×1 IMPLANT
SUT PROLENE 7 0 BV 1 (SUTURE) ×1 IMPLANT
SUT SILK 2 0 (SUTURE) ×6
SUT SILK 2-0 18XBRD TIE 12 (SUTURE) IMPLANT
SUT SILK 3 0 (SUTURE) ×6
SUT SILK 3-0 18XBRD TIE 12 (SUTURE) IMPLANT
SUT VIC AB 0 CT1 27 (SUTURE) ×6
SUT VIC AB 0 CT1 27XBRD ANBCTR (SUTURE) IMPLANT
SUT VIC AB 2-0 CT1 18 (SUTURE) ×2 IMPLANT
SUT VIC AB 2-0 CT1 27 (SUTURE)
SUT VIC AB 2-0 CT1 TAPERPNT 27 (SUTURE) IMPLANT
SUT VIC AB 3-0 SH 27 (SUTURE) ×6
SUT VIC AB 3-0 SH 27X BRD (SUTURE) IMPLANT
SYR 30ML LL (SYRINGE) ×1 IMPLANT
TOWEL GREEN STERILE (TOWEL DISPOSABLE) ×6 IMPLANT
TRAY FOLEY MTR SLVR 16FR STAT (SET/KITS/TRAYS/PACK) ×6 IMPLANT
TUBING HIGH PRESSURE 120CM (CONNECTOR) ×6 IMPLANT
WIRE BENTSON .035X145CM (WIRE) ×1 IMPLANT
WIRE HYDRA 450CM (MISCELLANEOUS) ×1 IMPLANT
WIRE ROSEN-J .035X260CM (WIRE) ×1 IMPLANT
WIRE STIFF LUNDERQUIST 260CM (WIRE) ×1 IMPLANT

## 2021-11-11 NOTE — Transfer of Care (Signed)
Immediate Anesthesia Transfer of Care Note  Patient: Darlene Griffith  Procedure(s) Performed: THORACIC AORTIC ENDOVASCULAR STENT GRAFT WITH LEFT BRACHIAL  ARTERY ACCESS (Chest) ULTRASOUND GUIDANCE FOR VASCULAR ACCESS (Bilateral: Groin) INTRAVASCULAR ULTRASOUND (Right: Groin) APPLICATION OF CELL SAVER (Abdomen) BYPASS GRAFT AORTA TO LEFT COMMON  FEMORAL ARTERY (Left: Groin)  Patient Location: SICU  Anesthesia Type:General  Level of Consciousness: sedated and Patient remains intubated per anesthesia plan  Airway & Oxygen Therapy: Patient remains intubated per anesthesia plan and Patient placed on Ventilator (see vital sign flow sheet for setting)  Post-op Assessment: Report given to RN and Post -op Vital signs reviewed and stable  Post vital signs: Reviewed and stable  Last Vitals:  Vitals Value Taken Time  BP    Temp    Pulse 69 11/11/21 1357  Resp 14 11/11/21 1357  SpO2 100 % 11/11/21 1357  Vitals shown include unvalidated device data.  Last Pain:  Vitals:   11/11/21 0509  TempSrc:   PainSc: 6       Patients Stated Pain Goal: 0 (28/11/88 6773)  Complications: No notable events documented.

## 2021-11-11 NOTE — OR Nursing (Signed)
On cleaning patient and removing drapes two suture boots an one 6-0 BV-1 needle noted by patient's side. Items removed and discarded by M. Nori Riis, [place].

## 2021-11-11 NOTE — Anesthesia Postprocedure Evaluation (Signed)
Anesthesia Post Note  Patient: Darlene Griffith  Procedure(s) Performed: THORACIC AORTIC ENDOVASCULAR STENT GRAFT WITH LEFT BRACHIAL  ARTERY ACCESS (Chest) ULTRASOUND GUIDANCE FOR VASCULAR ACCESS (Bilateral: Groin) INTRAVASCULAR ULTRASOUND (Right: Groin) APPLICATION OF CELL SAVER (Abdomen) BYPASS GRAFT AORTA TO LEFT COMMON  FEMORAL ARTERY (Left: Groin)     Patient location during evaluation: SICU Anesthesia Type: General Level of consciousness: sedated and patient remains intubated per anesthesia plan Pain management: pain level controlled Vital Signs Assessment: post-procedure vital signs reviewed and stable Respiratory status: patient remains intubated per anesthesia plan Cardiovascular status: stable Anesthetic complications: no   No notable events documented.  Last Vitals:  Vitals:   11/11/21 1630 11/11/21 1645  BP:    Pulse: 81 82  Resp: 18 15  Temp:    SpO2: 99% 100%    Last Pain:  Vitals:   11/11/21 1400  TempSrc: Oral  PainSc:                  Darlene Griffith

## 2021-11-11 NOTE — Anesthesia Procedure Notes (Addendum)
Arterial Line Insertion Start/End1/23/2023 7:45 AM, 11/11/2021 8:00 AM Performed by: [name], MD, Leonor Liv, CRNA, anesthesiologist  Patient location: Pre-op. Preanesthetic checklist: patient identified, IV checked, site marked, risks and benefits discussed, surgical consent, monitors and equipment checked, pre-op evaluation, timeout performed and anesthesia consent Lidocaine 1% used for infiltration radial was placed Catheter size: 20 G Hand hygiene performed , maximum sterile barriers used  and Seldinger technique used  Attempts: 2 Procedure performed using ultrasound guided technique. Ultrasound Notes:anatomy identified, needle tip was noted to be adjacent to the nerve/plexus identified, no ultrasound evidence of intravascular and/or intraneural injection and image(s) printed for medical record Following insertion, dressing applied and Biopatch. Post procedure assessment: normal and unchanged  Post procedure complications: second provider assisted. Patient tolerated the procedure well with no immediate complications.

## 2021-11-11 NOTE — Anesthesia Procedure Notes (Signed)
Procedure Name: Intubation Date/Time: 11/11/2021 8:24 AM Performed by: Leonor Liv, CRNA Pre-anesthesia Checklist: Patient identified, Emergency Drugs available, Suction available and Patient being monitored Patient Re-evaluated:Patient Re-evaluated prior to induction Oxygen Delivery Method: Circle System Utilized Preoxygenation: Pre-oxygenation with 100% oxygen Induction Type: IV induction Ventilation: Mask ventilation without difficulty Laryngoscope Size: Mac and 3 Grade View: Grade I Tube type: Oral Tube size: 7.0 mm Number of attempts: 1 Airway Equipment and Method: Stylet and Oral airway Placement Confirmation: ETT inserted through vocal cords under direct vision, positive ETCO2 and breath sounds checked- equal and bilateral Secured at: 21 cm Tube secured with: Tape Dental Injury: Teeth and Oropharynx as per pre-operative assessment

## 2021-11-11 NOTE — Anesthesia Procedure Notes (Addendum)
Central Venous Catheter Insertion Performed by: [name], MD, anesthesiologist Start/End1/23/2023 7:30 AM, 11/11/2021 7:45 AM Patient location: Pre-op. Preanesthetic checklist: patient identified, IV checked, site marked, risks and benefits discussed, surgical consent, monitors and equipment checked, pre-op evaluation, timeout performed and anesthesia consent Position: Trendelenburg Lidocaine 1% used for infiltration and patient sedated Hand hygiene performed  and maximum sterile barriers used  Catheter size: 9 Fr MAC introducer Procedure performed using ultrasound guided technique. Ultrasound Notes:anatomy identified, needle tip was noted to be adjacent to the nerve/plexus identified, no ultrasound evidence of intravascular and/or intraneural injection and image(s) printed for medical record Attempts: 1 Following insertion, line sutured, dressing applied and Biopatch. Post procedure assessment: blood return through all ports, free fluid flow and no air  Patient tolerated the procedure well with no immediate complications.

## 2021-11-11 NOTE — Progress Notes (Signed)
Seen in ICU throughout the afternoon. Doing well from a clinical standpoint. Moving all four extremities per bedside nursing. Opening eyes spontaneously.  BP 125/67    Pulse 82    Temp 97.8 F (36.6 C) (Oral)    Resp 15    Ht 5\' 1"  (1.549 m)    Wt 68.9 kg    SpO2 100%    BMI 28.70 kg/m  No distress Intubated / sedated. Able to weakly move hands, feet. Open eyes spontaneously on sedation holiday while I was examining her. Regular rate and rhythm Unlabored breathing Soft abdomen. Brisk doppler flow in PT bilaterally.  CBC    Component Value Date/Time   WBC 10.1 11/11/2021 0234   RBC 3.30 (L) 11/11/2021 0234   HGB 13.3 11/11/2021 1404   HCT 39.0 11/11/2021 1404   PLT 235 11/11/2021 0234   MCV 103.6 (H) 11/11/2021 0234   MCH 34.2 (H) 11/11/2021 0234   MCHC 33.0 11/11/2021 0234   RDW 11.2 (L) 11/11/2021 0234   LYMPHSABS 2.5 11/06/2021 0959   MONOABS 0.5 11/06/2021 0959   EOSABS 0.2 11/06/2021 0959   BASOSABS 0.0 11/06/2021 0959   BMP Latest Ref Rng & Units 11/11/2021 11/11/2021 11/11/2021  Glucose 70 - 99 mg/dL - 187(H) -  BUN 8 - 23 mg/dL - 6(L) -  Creatinine 0.44 - 1.00 mg/dL - 0.41(L) -  Sodium 135 - 145 mmol/L 141 140 142  Potassium 3.5 - 5.1 mmol/L 3.4(L) 3.4(L) 3.4(L)  Chloride 98 - 111 mmol/L - 104 -  CO2 22 - 32 mmol/L - 29 -  Calcium 8.9 - 10.3 mg/dL - 8.8(L) -   INR 1.1  Lactate 1.3  ABG: 7.4 / 394 / 34 / 32.5   POD#0 thoracic branch device TEVAR complicated by iliac rupture requiring emergent left common iliac - common femoral bypass  CNS: Continue fentanyl / propofol. RASS goal -1. Sedation holidays overnight to evaluate neurologic exam. Exam reassuring at moment. Will get a clearer picture in the hours to come.  CV: hemodynamics stable. Keep SBP 100-160.  Pulm: keep intubated overnight. Plan trial of extubation tomorrow. GI: NPO/OGT to wall suction. FEN/GU: Good response to resuscitation. Keep UOP > 80mL/kg/hr.  Heme: No evidence of ongoing bleeding. Blood  counts stable. No need for transfusion.  ID: No evidence of infection. Endo: BG goal 120-180. Prophy: SQH. SCDs. pepcid.   Dispo: Critical. Needs ICU care. Appreciate PCCM assistance.  [name]. Stanford Breed, MD [facility] Phone Number: [phone] 11/11/2021 4:53 PM

## 2021-11-11 NOTE — Progress Notes (Signed)
NAME:  Darlene Griffith, MRN:  [id number], DOB:  1951-07-16, LOS: 5 ADMISSION DATE:  11/06/2021, CONSULTATION DATE:  1/18 REFERRING MD:  Dr. Vanita Panda, CHIEF COMPLAINT:  Aortic dissection   History of Present Illness:  Patient is a 71 yo F w/ pertinent PMH of HTN, HLD, Hyperthyroidism, multinodular goiter present to [facility] ED on 1/18 with chest pain.  On 1/18, patient states she had acute onset chest pain  in upper back that radiates to her chest. EMS called. She states she has not been taking her home BP meds regularly. Arrived to [facility] ED. BP initially 136/76. Complaining of chest pain. EKG w/ sinus rhythm. Troponin and BNP WNL. CTA shows type B aortic dissection that extends into the infrarenal abdominal aorta and right common iliac artery. D-dimer 4.78. Started on Esmolol drip. Vascular surgery consulted. On 1/19 she was off all drips. Transferred to progressive and TRH on 1/20.  The evening of 1/20 the patient became increasingly hypertensive with some chest pain. Vascular surgery started the patient on cardine GTT. Transfer to ICU. PCCM consulted.    Pertinent  Medical History   Past Medical History:  Diagnosis Date   Chest pain    a. 01/2015 Echo: Nl LV fxn, Gr 1 DD, triv AI, mild MR.   Essential hypertension    Family history of lung cancer    Hepatic cyst    a. noted on CT 01/2015.   Hyperthyroidism    GOING TO [facility] FOR SECOND OPINION   Multinodular goiter    a. 01/2015 CT chest: multinodular goidter w/ substernal extension of the left lobe of the thyroid assoc w/ rightward deviation of tracheal air column.   Neck pain, chronic    Personal history of colonic polyps    Pulmonary nodules    a. 01/2015 CT Chest: RLL ~ 15mm subpleural nodule - rec f/u in 6-12 mos.   Splenic cyst    a. noted on CT 01/2015.   Significant Hospital Events: Including procedures, antibiotic start and stop dates in addition to other pertinent events   1/18: admitted to [facility]; aortic dissection; started on esmolol  drip  1/20: CTA chest  No change in descending AAA, HTN, started on cardiene GTT, TRH placed ICU transfer orders and PCCM consult 1/21 Still having CP, abdominal pain - no other acute issues overnight. Cardene weaned off  Interim History / Subjective:  TEVAR in OR today- had disruption of iliac artery upon removing sheath with rapid drop in BP progressing to CPR with ROSC after 3 minutes.  Required open repair and bypass.   Given 2835 ml PRBC, 307 ml PLT, 1185 ml FFP, 1 unit cryo, 3L crystalloid and 1262ml of albumin in OR.  Returned to ICU intubated, on propofol and cleviprex   Objective   Blood pressure (!) 167/85, pulse 84, temperature 97.8 F (36.6 C), temperature source Oral, resp. rate 20, height 5\' 1"  (1.549 m), weight 68.9 kg, SpO2 99 %.    Vent Mode: PRVC FiO2 (%):  [40 %-100 %] 40 % Set Rate:  [16 bmp-20 bmp] 20 bmp Vt Set:  [380 mL] 380 mL PEEP:  [5 cmH20] 5 cmH20 Plateau Pressure:  [21 cmH20] 21 cmH20   Intake/Output Summary (Last 24 hours) at 11/11/2021 1526 Last data filed at 11/11/2021 1500 Gross per 24 hour  Intake 9068.82 ml  Output 3725 ml  Net 5343.82 ml   Filed Weights   11/08/21 2021 11/09/21 0500 11/11/21 0358  Weight: 66.7 kg 67.8 kg 68.9 kg  Examination: Propofol held for exam  General:  critically ill older female sedated in NAD HEENT: MM pink/dry, pupils 3/reactive, OGT, ETT Neuro:  was able to follow simple commands- wiggle toes/ grips hand, more brisk on right, reaching towards ETT with RUE CV: rr, NSR, no murmur PULM:  non labored on full MV support, clear/ diminished GI: soft, hypoBS, ND, incision LLQ dressing dry, JP drain with bloody drainage  Extremities: warm/dry, no LE edema, R radial aline Skin: no rashes  Resolved Hospital Problem list     Assessment & Plan:  Aortic Dissection: type B; extends into the infrarenal abdominal aorta and right common iliac artery s/p TEVAR 7/12 complicated by iliac artery disruption s/p left common  iliac to common femoral artery bypass HTN HX HLD Hx of tobacco abuse P: - Per Vascular surgery  - wean cleviprex for SBP < 140 - monitor distal perfusion - following commands in ICU when sedation paused, monitor closely for any focal neuro deficits given risk of CVA/ hemiplegia - continue coreg, hydralazine, and statin  - ongoing smoking cessation education when appropriate   Cardiac arrest secondary to ABLA - ROSC after 61mins P: - s/p 2835 ml PRBC, 307 ml PLT, 1185 ml FFP, 1 unit cryo, 3L crystalloid and 1280ml of albumin in OR - stable iCa post transfusions at 1.32 - pending CBC, CMET, and coags  - remains hemodynamically stable on cleviprex for goal SBP < 140, MAP > 65 - following commands in ICU when sedation paused - avoid fever   Post-op vent management P:  - full MV support, PRVC 8cc/kg, rate 20, goal Pplat <30 and DP<15  - CXR with stable ETT/ lines.  ABG reviewed - VAP prevention protocol/ PPI - PAD protocol for sedation> propofol and fentanyl for RASS goal 0/-1 w/ bowel regimen - wean FiO2 as able for SpO2 >92%  - plan to rest on vent overnight and assess for extubation 1/24  AKI P: - pending BMET - continue foley  - strict I/Os/ trend  urinary output - Replace electrolytes as indicated - Avoid nephrotoxic agents, ensure adequate renal perfusion w/ MAP goal > 65   Leukocytosis:  Suspect reactive due to above. WBC 11.9>11.9 > 10.1 P: - Trend WBC/Fever curve  Nausea Suspect secondary to aortic dissection -PRN zofran and compazine  Subclinical Hyperthyroidism Hx of Hyperthyroidism and multinodular goiter - 11/08/21 TSH 0.022, FT4 0.92 P: -no home meds listed -will need follow up outpatient  Hx of pulmonary nodules: 1/18 CT chest 5 mm nodule; same size as CT 2016 P: -will need follow up outpatient   Best Practice (right click and "Reselect all SmartList Selections" daily)   Diet/type: NPO DVT prophylaxis: SCD GI prophylaxis: PPI Lines: Central  line and Arterial Line- R IJ CVC and R radial Aline Foley:  Yes, and it is still needed Code Status:  full code Last date of multidisciplinary goals of care discussion:  Two daughters remains at bedside, updated on plan of care    Critical Care Time: 26 mins      Kennieth Rad, ACNP [facility] 11/11/2021, 3:26 PM  See Amion for pager If no response to pager, please call PCCM consult pager After 7:00 pm call Elink

## 2021-11-11 NOTE — Progress Notes (Signed)
[facility] OF [place] PROGRESS NOTE  ASSESSMENT / PLAN: Darlene Griffith is a 71 y.o. female with type B aortic dissection failing medical management. She has persistent chest pain despite anti-impulse control. Her blood pressure has been difficult to control. For these reasons, I offered her urgent TEVAR. I offered her TEVAR + left carotid-subclavian transposition or thoracic branch device. I explained the risk of stroke (<5%), hemiplegia (1-2%), and the risk for future reintervention. The patient is understanding and wishes to proceed with a thoracic branch device.    SUBJECTIVE: Continues to have midscapular back pain. She describes it as 10/10. She feels she cannot go on with pain like this.   OBJECTIVE: BP (!) 159/64 Comment: s/p hydralazine   Pulse 72    Temp 98.1 F (36.7 C) (Oral)    Resp 12    Ht 5\' 1"  (1.549 m)    Wt 68.9 kg    SpO2 100%    BMI 28.70 kg/m   Intake/Output Summary (Last 24 hours) at 11/11/2021 0659 Last data filed at 11/10/2021 2148 Gross per 24 hour  Intake 0 ml  Output 475 ml  Net -475 ml    Constitutional: Chronically ill appearing. no acute distress. Cardiac: RRR. Pulmonary: unlabored Abdomen: soft Vascular: 2+ radial pulses; 2+ femoral pulses  CBC Latest Ref Rng & Units 11/11/2021 11/10/2021 11/09/2021  WBC 4.0 - 10.5 K/uL 10.1 11.8(H) 13.6(H)  Hemoglobin 12.0 - 15.0 g/dL 11.3(L) 10.8(L) 10.6(L)  Hematocrit 36.0 - 46.0 % 34.2(L) 33.2(L) 33.7(L)  Platelets 150 - 400 K/uL 235 186 195     CMP Latest Ref Rng & Units 11/11/2021 11/10/2021 11/09/2021  Glucose 70 - 99 mg/dL 95 97 96  BUN 8 - 23 mg/dL 8 8 9   Creatinine 0.44 - 1.00 mg/dL 0.45 0.50 0.45  Sodium 135 - 145 mmol/L 138 137 137  Potassium 3.5 - 5.1 mmol/L 3.6 3.6 4.0  Chloride 98 - 111 mmol/L 104 104 106  CO2 22 - 32 mmol/L 26 24 20(L)  Calcium 8.9 - 10.3 mg/dL 8.8(L) 8.6(L) 8.4(L)  Total Protein 6.5 - 8.1 g/dL - - -  Total Bilirubin 0.3 - 1.2 mg/dL - - -  Alkaline Phos 38 -  126 U/L - - -  AST 15 - 41 U/L - - -  ALT 0 - 44 U/L - - -    Estimated Creatinine Clearance: 58.1 mL/min (by C-G formula based on SCr of 0.45 mg/dL).  Darlene Griffith. Stanford Breed, MD [facility] Phone Number: [phone] 11/11/2021 6:59 AM

## 2021-11-11 NOTE — Progress Notes (Signed)
Patient getting pain med continuously as parameters will allow. Patient states that it feels like someone is stabbing in her back. Pain has been 10. Patient states that the pain will decrease but then it will come right back.   Patient given dilaudid. Will recheck b/p to see if it comes down with the pain med given.   Hydralazine will be given per order if the pain medicine does not decrease b/p.   Will continue to monitor

## 2021-11-11 NOTE — Op Note (Signed)
DATE OF SERVICE: 11/11/2021  PATIENT:  Darlene Griffith  71 y.o. female  PRE-OPERATIVE DIAGNOSIS:  symptomatic type B aortic dissection despite medical therapy  POST-OPERATIVE DIAGNOSIS:  Same, intraoperative left external iliac disruption  PROCEDURE:   1) bilateral ultrasound-guided common femoral artery access 2) left brachial artery exposure and percutaneous access 3) thoracic branched endovascular aortic repair (see below for device details) 4) left common and external iliac artery stenting (11 x 22m VBX x2) 5) left common iliac to common femoral artery bypass with 8 mm externally supported PTFE  SURGEON:  Surgeon(s) and Role:    * HCherre Robins MD - Primary  CO-SURGEON: CBasilia Jumbo MD  ASSISTANT: JCassandria Santee MD  MODIFIER: A 22 modifier is appropriate given the complexity of repair required and the external iliac disruption making it 200% more difficult than typical. A co-surgeon was needed to proceed through the case safely.  ANESTHESIA:   general  ESTIMATED BLOOD LOSS: 20037m URINE OUTPUT: 95038mBLOOD PRODUCTS GIVEN:   2835m77mBC  307mL6m  1185mL 68m 1 unit cryopreciptate  INTRAVENOUS FLUIDS GIVEN:  3000mL c25malloid  1250mL al74mn  DRAINS: 71F JP to left retroperitoneal space   LOCAL MEDICATIONS USED:  NONE  SPECIMEN:  none  COUNTS: XR performed showing no foreign bodies.  TOURNIQUET:  none  PATIENT DISPOSITION:  ICU - intubated and hemodynamically stable.   Delay start of Pharmacological VTE agent (>24hrs) due to surgical blood loss or risk of bleeding: no  INDICATION FOR PROCEDURE: Darlene Griffith y.o. 57male with symptomatic type B aortic dissection.  Despite medical management, the patient had persistent, 10 out of 10, mid scapular back pain radiating to the chest and abdomen. After careful discussion of risks, benefits, and alternatives the patient was offered thoracic endovascular aortic repair. We specifically  discussed risk of stroke and risk of hemiplegia. The patient understood and wished to proceed.  ENDOVASCULAR REPAIR EQUIPMENT: 31 x 31 x 150mm Gor83moracic branch endoprosthesis with 8mm porta97m0 x 60mm Gore 65m subclavian artery sidebranch 31 x 31 x 150mm Gore C77mdistal thoracic extension 11 x 79mm VBX x 244mttempted iliac stenting (later debrided)  OPERATIVE FINDINGS: Unremarkable percutaneous access.  Able to navigate into the true lumen easily from the left access.  The right access required some manipulation, but were able to navigate into the true lumen.  IVUS was used to confirm true lumen position. Difficult to navigate 22 French sheath through the left external iliac artery system.  This was predilated with a 10 mm balloon gently.  After this, the sheath was able to be delivered into the infrarenal aorta.  TEVAR with sidebranch deployed in standard fashion.  Good technical result from TEVAR.  Upon removing the sheath, rapid drop in blood pressure was noted.  Angiogram confirmed complete disruption of the external iliac artery.  The sheath was repositioned across the disruption with some improvement in hemodynamics.  I elected to try to repair this minimally invasively.  This was not successful with balloon expandable covered iliac stenting.  Patient's blood pressure did drop to an undetectable range and CPR was initiated quickly, which restored a perfusing pressure.  ROSC was achieved within 3 minutes.  Rapid exposure of the infrarenal aorta was achieved through a left retroperitoneal exposure with a curvilinear incision.  Clamps were applied to the proximal and distal areas of bleeding.  Surgical hemostasis was achieved.  Anesthesia was able to catch up.  Good  hemodynamics were noted throughout the rest of the case.  A left common iliac to common femoral artery bypass was then performed using an 8 mm externally supported PTFE graft.  This was unremarkable.  The patient was transferred to the  ICU in critical, but hemodynamically stable condition.  DESCRIPTION OF PROCEDURE: After identification of the patient in the pre-operative holding area, the patient was transferred to the operating room. The patient was positioned supine on the operating room table. Anesthesia was induced. The chest, neck, left arm, abdomen, groins, and thighs were prepped and draped in standard fashion. A surgical pause was performed confirming correct patient, procedure, and operative location.  Using ultrasound guidance, the left common femoral artery was accessed with micropuncture technique.  The access was dilated with a 8 French sheath dilator.  A Perclose device was placed at 10 and 2:00 with plans to use this at the end of the case.  The sutures were placed on her related shots and secured away from the exposure.  We navigated a Bentson wire into the descending thoracic aorta.  The 8 French sheath was then introduced into the common femoral artery.  Over the wire, intravascular ultrasound was performed to confirm true lumen position.  The wire was then exchanged for a double curved Lunderquist wire placed into the ascending aorta.  Using ultrasound guidance the right common femoral artery was accessed with micropuncture technique the access was dilated with an 8 French sheath dilator.  A Perclose device was placed in the anterior wall with plans uses at the end the case.  Sutures were secured.  Initially, a Bentson wire was navigating into the false lumen.  I then used IVUS and a angled Glidewire to navigate into the true lumen.  IVUS was again used to confirm a true lumen position.  The wire was advanced in the ascending aorta.  A pigtail catheter was then delivered into the ascending aorta.  A longitudinal brachial artery exposure was performed at the distal humerus just above the antecubital fossa.  Was carried down through subcutaneous tissue until the brachial sheath was encountered.  This was entered sharply.   The brachial artery was exposed and encircled proximally distally.  Micropuncture technique was used to access the artery directly.  Our access was then upsized to a 5 [place].  A Antonietta Breach was then used to navigate a wire into the ascending aorta.  A Berenstein catheter was used to deflect the wire into the descending thoracic aorta.  A 5 French by 45 cm sheath was then delivered into the orifice of the left subclavian artery.  After establishing access in the femoral and brachial arteries, the patient was systemically heparinized.  Activated clotting time measurements were used throughout the case to confirm adequate anticoagulation.  Our left-sided access was then upsized to 22 [place] over the Lunderquist wire.  It was difficult to track the sheath through the pelvis.  I left the sheath in the common femoral artery, with hopes to "bareback" the device through the iliac arteries.  The Antonietta Breach was snared in the perivisceral aorta with a endovascular snare kit from the left common femoral artery access.  The wire was externalized.  The wire was then fed through a thoracic branch endoprosthesis (see above for details).  The device was also loaded on the Lunderquist wire.  This was per the manufacturer's instructions.  The device was then delivered through the 22 [place] sheath.  We were not able to "bareback" the device through the external iliac  artery.  The device was removed.  I then gently dilated the external iliac artery with a 10 x 60 mm Mustang balloon.  This allowed the sheath and dilator to pass with very mild resistance.  Hemostatics were stable throughout these maneuvers.  We made a note to turn our attention to this at the end of the case.  We then deployed the thoracic branched endoprosthesis per the manufacturer's instructions.  The device was positioned in the arch of the aorta, at zone 2.  We ensured there was no "wire wrap" and the device was well-positioned with the portal immediately about  the orifice of the left subclavian artery.  The left subclavian sheath was withdrawn into the subclavian artery so as not to impinge on the deployment of the stent graft.  An arch aortogram was performed.  Takeoff of the brachiocephalic vessels was marked.  A bovine arch was reconfirmed.  Forward pressure was held on the Lunderquist wire to oppose the stent graft to the outer wall of the aorta.  The stent graft was deployed swiftly, per the manufacturer's instructions.  The device did seem to "jump" forward a bit.  We performed balloon angioplasty of the portal with a 10 x 60 mm Mustang balloon to snug the device more distally in the arch of the aorta.  This worked well and the device seem to migrate more distally in the aorta..    We then delivered a 10 x 60 mm Gore sidebranch through the portal using the 5 French x 45 cm to help deliver it through the portal.  I retrograde left brachial angiogram was performed and used to identify the takeoff of the left vertebral artery.  It appeared well away from our stent, so we elected to proceed..  The sidebranch was deployed in standard fashion.  The stent was then postdilated in standard fashion using a 10 x 60 mm Mustang balloon.  A proximal completion angiogram was performed.  Once the parallax was removed, good flow through the origin of the innominate and left carotid artery was noted.  Some impingement of the bare-metal stent was noted across the orifice, but the covered stent did not impinge upon the common origin.  Brisk flow was noted through the sidebranch into the left subclavian artery.  The pigtail catheter was then rewired and repositioned into the stent graft.  The image intensifier was then positioned down over the descending thoracic and perivisceral aorta. An additional aortogram was performed.  This identified the major tear in the descending thoracic aorta, as well as the celiac artery.  An additional 31 x 31 x 150 mm CTAG device was deployed in  standard fashion with 4 cm of overlap into the existing endoprosthesis.  Follow-up angiogram was performed.  This revealed cessation of flow into the type B aortic dissection through the major fenestration.  The celiac artery was preserved.  A significant amount of descending thoracic aorta was noted between the end of the endoprosthesis and the celiac artery.  About 200 cm of aorta was covered with the 2 endoprostheses.  At this point, our attention was turned to the left external iliac artery.  A 2 French dilator was placed on the wire, at the ready, showed a transection be identified.  The sheath was withdrawn.  An abrupt drop in hemodynamics was noted.  An angiogram was performed, confirming disruption of the external iliac artery.  The dilator was inserted through the sheath.  The sheath was delivered back into the aortic bifurcation.  Hemostasis was briefly improved.  Anesthesia began resuscitating the patient with crystalloid and albumin.  Blood was called to the room.  We brought to 11 x 79 mm VBX balloon expandable stents onto the field with plans to bridge this disruption.  After careful communication with anesthesia, we withdrew our sheath again and deployed our for stent in standard fashion.  Her hemodynamics abruptly shifted downward.  Her blood pressure became nearly undetectable.  I swiftly placed an additional 11 x 79 mm VBX stent to the level of the acetabulum, but this was not effective in hemostatic control.  The stent, then, would not come off of its balloon amount.  I tried to pull this out, but could not to deliver the sheath back through the disruption.  At this point I elected to open to obtain hemostatic control.  The patient briefly developed asystole.  Sterile cardiopulmonary resuscitation with excellent quality compressions was delivered upon identification of the dangerously low pressure.  A perfusing pressure was noted at the arterial line after initiation of compression.   Epinephrine, and blood products were rapidly infused into the patient.  Simultaneously a curvilinear retroperitoneal exposure was made made over the left flank.  This was carried down through subcutaneous tissue until the anterior rectus fascia was identified.  This was divided.  The rectus fascia was rotated medially.  We identified the oblique muscles and divided them.  We entered the retroperitoneal space bluntly.  We swept the peritoneal contents anteriorly.  We obtained manual control over the aortic bifurcation.  Hemostatics were improved.  Henley clamps were then applied to the aortic bifurcation and the visualized stent in the left distal pelvis.  Spontaneous circulation was achieved after about 3 minutes of cardiopulmonary resuscitation.  We set up a self-retaining Omni system to allow hands-free exposure of the retroperitoneal space.  No further maneuvers were performed until the anesthesia team was happy with their resuscitation.  They quickly achieved stable blood pressure and heart rate.  Ongoing resuscitation was excellent (see anesthesia flow sheet for full detail).   Once resuscitation was achieved, I began exploring the disrupted external iliac artery.  We were able to place a Satinsky clamp on the left common iliac artery at its bifurcation.  The external iliac artery appeared to be avulsed off of the bifurcation.  One of the 11 x 79 mm VBX stents was hanging out of this disruption and manually removed.  We debrided back to healthy artery and identified the takeoff of the hypogastric artery.  A Henley clamp was applied to the hypogastric artery.  We then explored the distal external iliac artery.  This was completely disrupted from its origin to the mid common femoral artery.  All of the artery had telescoped onto the sheath.  A curvilinear incision was made over the sheath in the groin and carried down to the level of the common femoral artery.  After some exposure, we are able to identify  the transected common femoral artery.  This was debrided back to healthy tissue.  Common femoral bifurcation was identified and noted to be intact.  Good backbleeding was noted.  A Henley clamp was applied to the common femoral artery.  An 8 mm externally supported PTFE graft was brought onto the field and delivered through the previous tunnel where the external iliac was.  The proximal aspect of the graft was beveled to allow end-to-side anastomosis to the distal common iliac artery/hypogastric artery common channel.  This anastomosis was performed using continuous running suture  with a 5-0 Prolene.  1 repair stitch was needed.  After this anastomosis, clamps were released on the common iliac artery and hypogastric artery.  Hemostasis was achieved in the proximal anastomosis.  The vascular graft was flushed down the open end of the graft.  The graft was cut to lay without undue tension or redundancy to allow end-to-end anastomosis to the common femoral artery in the groin.  This anastomosis was accomplished using continuous running suture of 5-0 Prolene.  Immediately prior to completion the graft was flushed and de-aired.  The graft anastomosis was completed.  Hemostasis was again confirmed in both anastomoses.  A Doppler machine was brought onto the field and confirmed Doppler flow in the distal common femoral artery.  Doppler flow was confirmed in the posterior tibial arteries bilaterally.  Doppler flow was confirmed in the radial artery.  We ended the case here.  Heparin was reversed with protamine.  All surgical exposures were copiously irrigated with saline.  A 19 French channel JP drain was delivered into the left retroperitoneal space.  The left brachial exposure was closed in layers using 2-0 Vicryl and a surgical stapler.  The left retroperitoneal exposure was closed in layers using 0 PDS, 2-0 Vicryl, and a surgical stapler.  The left groin was closed using 2-0 Vicryl and a surgical stapler.  The  right common femoral access was closed with the previously placed Perclose device.  Hemostasis was good after this.  Doppler flow was again confirmed in the left radial artery, and bilateral posterior tibial arteries.  Upon completion of the case x-ray was performed to confirm no retained foreign bodies in the heads. The patient was transferred to the ICU, intubated but hemodynamically stable.  The patient's family was updated with her status. I was present for all portions of the procedure.  [name]. Stanford Breed, MD [facility] Phone Number: [phone] 11/11/2021 1:35 PM

## 2021-11-12 ENCOUNTER — Encounter (HOSPITAL_COMMUNITY): Payer: Self-pay | Admitting: Vascular Surgery

## 2021-11-12 ENCOUNTER — Inpatient Hospital Stay (HOSPITAL_COMMUNITY): Payer: BC Managed Care – PPO

## 2021-11-12 DIAGNOSIS — I469 Cardiac arrest, cause unspecified: Secondary | ICD-10-CM | POA: Diagnosis not present

## 2021-11-12 DIAGNOSIS — Z9911 Dependence on respirator [ventilator] status: Secondary | ICD-10-CM | POA: Diagnosis not present

## 2021-11-12 DIAGNOSIS — I7100 Dissection of unspecified site of aorta: Secondary | ICD-10-CM | POA: Diagnosis not present

## 2021-11-12 LAB — BPAM FFP
Blood Product Expiration Date: 202301232359
Blood Product Expiration Date: 202301232359
Blood Product Expiration Date: 202301232359
Blood Product Expiration Date: 202301282359
Blood Product Expiration Date: 202301282359
Blood Product Expiration Date: 202301282359
Blood Product Expiration Date: 202301282359
Blood Product Expiration Date: 202302022359
Blood Product Expiration Date: 202302132359
Blood Product Expiration Date: 202302132359
Blood Product Expiration Date: 202302162359
ISSUE DATE / TIME: 202301231105
ISSUE DATE / TIME: 202301231105
ISSUE DATE / TIME: 202301231105
ISSUE DATE / TIME: 202301231105
ISSUE DATE / TIME: 202301231115
ISSUE DATE / TIME: 202301231115
ISSUE DATE / TIME: 202301231115
ISSUE DATE / TIME: 202301231115
ISSUE DATE / TIME: 202301231127
ISSUE DATE / TIME: 202301231127
ISSUE DATE / TIME: 202301231127
Unit Type and Rh: 600
Unit Type and Rh: 600
Unit Type and Rh: 600
Unit Type and Rh: 6200
Unit Type and Rh: 6200
Unit Type and Rh: 6200
Unit Type and Rh: 6200
Unit Type and Rh: 6200
Unit Type and Rh: 6200
Unit Type and Rh: 6200
Unit Type and Rh: 6200

## 2021-11-12 LAB — PREPARE FRESH FROZEN PLASMA
Unit division: 0
Unit division: 0
Unit division: 0
Unit division: 0
Unit division: 0
Unit division: 0
Unit division: 0
Unit division: 0
Unit division: 0
Unit division: 0
Unit division: 0

## 2021-11-12 LAB — CBC
HCT: 34.8 % — ABNORMAL LOW (ref 36.0–46.0)
HCT: 35.1 % — ABNORMAL LOW (ref 36.0–46.0)
HCT: 31.4 % — ABNORMAL LOW (ref 36.0–46.0)
Hemoglobin: 11.6 g/dL — ABNORMAL LOW (ref 12.0–15.0)
Hemoglobin: 11.7 g/dL — ABNORMAL LOW (ref 12.0–15.0)
Hemoglobin: 10.5 g/dL — ABNORMAL LOW (ref 12.0–15.0)
MCH: 30.2 pg (ref 26.0–34.0)
MCH: 30.3 pg (ref 26.0–34.0)
MCH: 30.9 pg (ref 26.0–34.0)
MCHC: 33.3 g/dL (ref 30.0–36.0)
MCHC: 33.3 g/dL (ref 30.0–36.0)
MCHC: 33.4 g/dL (ref 30.0–36.0)
MCV: 90.6 fL (ref 80.0–100.0)
MCV: 90.9 fL (ref 80.0–100.0)
MCV: 92.4 fL (ref 80.0–100.0)
Platelets: 108 10*3/uL — ABNORMAL LOW (ref 150–400)
Platelets: 115 10*3/uL — ABNORMAL LOW (ref 150–400)
Platelets: 109 10*3/uL — ABNORMAL LOW (ref 150–400)
RBC: 3.84 MIL/uL — ABNORMAL LOW (ref 3.87–5.11)
RBC: 3.86 MIL/uL — ABNORMAL LOW (ref 3.87–5.11)
RBC: 3.4 MIL/uL — ABNORMAL LOW (ref 3.87–5.11)
RDW: 15.3 % (ref 11.5–15.5)
RDW: 15.3 % (ref 11.5–15.5)
RDW: 15 % (ref 11.5–15.5)
WBC: 13.3 10*3/uL — ABNORMAL HIGH (ref 4.0–10.5)
WBC: 13.8 10*3/uL — ABNORMAL HIGH (ref 4.0–10.5)
WBC: 12.5 10*3/uL — ABNORMAL HIGH (ref 4.0–10.5)
nRBC: 0 % (ref 0.0–0.2)
nRBC: 0 % (ref 0.0–0.2)
nRBC: 0 % (ref 0.0–0.2)

## 2021-11-12 LAB — HEPATIC FUNCTION PANEL
ALT: 18 U/L (ref 0–44)
AST: 20 U/L (ref 15–41)
Albumin: 2.6 g/dL — ABNORMAL LOW (ref 3.5–5.0)
Alkaline Phosphatase: 50 U/L (ref 38–126)
Bilirubin, Direct: 0.2 mg/dL (ref 0.0–0.2)
Indirect Bilirubin: 0.7 mg/dL (ref 0.3–0.9)
Total Bilirubin: 0.9 mg/dL (ref 0.3–1.2)
Total Protein: 5.1 g/dL — ABNORMAL LOW (ref 6.5–8.1)

## 2021-11-12 LAB — GLUCOSE, CAPILLARY
Glucose-Capillary: 100 mg/dL — ABNORMAL HIGH (ref 70–99)
Glucose-Capillary: 106 mg/dL — ABNORMAL HIGH (ref 70–99)
Glucose-Capillary: 107 mg/dL — ABNORMAL HIGH (ref 70–99)
Glucose-Capillary: 115 mg/dL — ABNORMAL HIGH (ref 70–99)
Glucose-Capillary: 89 mg/dL (ref 70–99)
Glucose-Capillary: 93 mg/dL (ref 70–99)
Glucose-Capillary: 97 mg/dL (ref 70–99)

## 2021-11-12 LAB — BPAM PLATELET PHERESIS
Blood Product Expiration Date: 202301242359
ISSUE DATE / TIME: 202301231115
Unit Type and Rh: 6200

## 2021-11-12 LAB — BPAM CRYOPRECIPITATE
Blood Product Expiration Date: 202301231724
ISSUE DATE / TIME: 202301231151
Unit Type and Rh: 5100

## 2021-11-12 LAB — BASIC METABOLIC PANEL
Anion gap: 8 (ref 5–15)
BUN: 12 mg/dL (ref 8–23)
CO2: 28 mmol/L (ref 22–32)
Calcium: 8.6 mg/dL — ABNORMAL LOW (ref 8.9–10.3)
Chloride: 105 mmol/L (ref 98–111)
Creatinine, Ser: 0.74 mg/dL (ref 0.44–1.00)
GFR, Estimated: >60 mL/min (ref >60)
Glucose, Bld: 112 mg/dL — ABNORMAL HIGH (ref 70–99)
Potassium: 3.7 mmol/L (ref 3.5–5.1)
Sodium: 141 mmol/L (ref 135–145)

## 2021-11-12 LAB — PREPARE CRYOPRECIPITATE: Unit division: 0

## 2021-11-12 LAB — BLOOD PRODUCT ORDER (VERBAL) VERIFICATION

## 2021-11-12 LAB — PREPARE PLATELET PHERESIS: Unit division: 0

## 2021-11-12 LAB — TRIGLYCERIDES: Triglycerides: 130 mg/dL (ref <150)

## 2021-11-12 MED ORDER — PANTOPRAZOLE SODIUM 40 MG PO TBEC: 40.0000 mg | DELAYED_RELEASE_TABLET | Freq: Every day | ORAL | Status: DC

## 2021-11-12 MED ORDER — ACETAMINOPHEN 325 MG PO TABS
650.0000 mg | ORAL_TABLET | Freq: Four times a day (QID) | ORAL | Status: DC
  Administered 2021-11-12 – 2021-11-13 (×3): 650 mg via ORAL
  Filled 2021-11-12 (×3): qty 2

## 2021-11-12 MED ORDER — SODIUM CHLORIDE 0.9% FLUSH: 9.0000 mL | INTRAVENOUS | Status: DC | PRN

## 2021-11-12 MED ORDER — ORAL CARE MOUTH RINSE
15.0000 mL | Freq: Two times a day (BID) | OROMUCOSAL | Status: DC
  Administered 2021-11-12 – 2021-11-21 (×15): 15 mL via OROMUCOSAL

## 2021-11-12 MED ORDER — LACTATED RINGERS IV SOLN: INTRAVENOUS | Status: DC

## 2021-11-12 MED ORDER — DIPHENHYDRAMINE HCL 12.5 MG/5ML PO ELIX: 12.5000 mg | ORAL_SOLUTION | Freq: Four times a day (QID) | ORAL | Status: DC | PRN

## 2021-11-12 MED ORDER — HYDROMORPHONE 1 MG/ML IV SOLN: INTRAVENOUS | Status: DC

## 2021-11-12 MED ORDER — ONDANSETRON HCL 4 MG/2ML IJ SOLN: 4.0000 mg | Freq: Four times a day (QID) | INTRAMUSCULAR | Status: DC | PRN

## 2021-11-12 MED ORDER — ATORVASTATIN CALCIUM 40 MG PO TABS
40.0000 mg | ORAL_TABLET | Freq: Every day | ORAL | Status: DC
  Administered 2021-11-13 – 2021-11-21 (×9): 40 mg via ORAL
  Filled 2021-11-12 (×9): qty 1

## 2021-11-12 MED ORDER — DIPHENHYDRAMINE HCL 50 MG/ML IJ SOLN: 12.5000 mg | Freq: Four times a day (QID) | INTRAMUSCULAR | Status: DC | PRN

## 2021-11-12 MED ORDER — NALOXONE HCL 0.4 MG/ML IJ SOLN: 0.4000 mg | INTRAMUSCULAR | Status: DC | PRN

## 2021-11-12 MED ORDER — CARVEDILOL 25 MG PO TABS
25.0000 mg | ORAL_TABLET | Freq: Two times a day (BID) | ORAL | Status: DC
  Administered 2021-11-12 – 2021-11-21 (×18): 25 mg via ORAL
  Filled 2021-11-12 (×18): qty 1

## 2021-11-12 MED ORDER — HYDROMORPHONE 1 MG/ML IV SOLN
INTRAVENOUS | Status: DC
  Administered 2021-11-12: 2.3 mg via INTRAVENOUS
  Administered 2021-11-13: 1.5 mg via INTRAVENOUS
  Administered 2021-11-13: 0.9 mg via INTRAVENOUS
  Administered 2021-11-13 – 2021-11-14 (×2): 1.2 mg via INTRAVENOUS
  Administered 2021-11-14: 3 mg via INTRAVENOUS
  Administered 2021-11-15: 2.7 mg via INTRAVENOUS
  Administered 2021-11-15: 1.8 mg via INTRAVENOUS
  Filled 2021-11-12 (×2): qty 30

## 2021-11-12 MED ORDER — DIPHENHYDRAMINE HCL 50 MG/ML IJ SOLN
12.5000 mg | Freq: Four times a day (QID) | INTRAMUSCULAR | Status: DC | PRN
  Administered 2021-11-13 – 2021-11-15 (×2): 12.5 mg via INTRAVENOUS
  Filled 2021-11-12 (×2): qty 1

## 2021-11-12 MED ORDER — IPRATROPIUM-ALBUTEROL 0.5-2.5 (3) MG/3ML IN SOLN: 3.0000 mL | Freq: Four times a day (QID) | RESPIRATORY_TRACT | Status: DC | PRN

## 2021-11-12 MED ORDER — MORPHINE SULFATE 1 MG/ML IV SOLN PCA: INTRAVENOUS | Status: DC

## 2021-11-12 MED ORDER — IPRATROPIUM-ALBUTEROL 0.5-2.5 (3) MG/3ML IN SOLN
3.0000 mL | RESPIRATORY_TRACT | Status: DC
  Administered 2021-11-12 (×2): 3 mL via RESPIRATORY_TRACT
  Filled 2021-11-12 (×4): qty 3

## 2021-11-12 MED ORDER — LIDOCAINE HCL URETHRAL/MUCOSAL 2 % EX GEL
1.0000 "application " | Freq: Once | CUTANEOUS | Status: AC
  Administered 2021-11-12: 1 via TOPICAL
  Filled 2021-11-12: qty 6

## 2021-11-12 NOTE — Progress Notes (Addendum)
eLink Physician-Brief Progress Note Patient Name: TALECIA SHERLIN DOB: [birth year] MRN: [id number]   Date of Service  11/12/2021  HPI/Events of Note  Patient partially dislodged NGT. Discussed with RN. Plan to re-insert with lidocaine jelly.   Patient also reporting abdominal and back pain.   eICU Interventions  F/u KUB after re-insertion of NGT> Appropriate position  3:15 AM RN reports increased frequency of PVCs. Patient with uncontrolled chest/back pain as well as stomach discomfort despite PCA. Ground team called to evaluate at bedside.       Intervention Category Intermediate Interventions: Diagnostic test evaluation  Rihaan Barrack Rodman Pickle 11/12/2021, 9:42 PM

## 2021-11-12 NOTE — Progress Notes (Signed)
Seen on evening rounds. Looks great. Extubated. Back to neurologic baseline. Pain control remains an issue.  BP (!) 140/59 (BP Location: Right Arm)    Pulse 85    Temp 98.1 F (36.7 C) (Oral)    Resp 19    Ht 5\' 1"  (1.549 m)    Wt 70.8 kg    SpO2 97%    BMI 29.49 kg/m  NAD RRR Unlabored breathing Abdomen appropriately tender CN intact. 5/5 throughout.  Looks great POD#1 thoracic branch endovascular aortic repair complicated by iliac rupture. Rupture repaired by common iliac to common femoral bypass.  CNS: PCA pain control. Start mobilizing with PT / OT. Delirium precautions. CV: SBP goal 100-160. Hemodynamics OK. Pulm: Pulmonary hygiene: IS / OOB. GI: NPO/NGT to wall suction. OK for sips. Await return of bowel function. FEN/GU: Increase IVF today. Monitor urine output. Keep foley. Await return of bowel function for enteral nutrition.  Heme: blood counts stable. no need for transfusion.  ID: no evidence of infection. Endo: BG goal 120-180. Prophy: SQH. SCDs. pepcid.    [name]. Stanford Breed, MD [facility] Phone Number: [phone] 11/12/2021 5:09 PM

## 2021-11-12 NOTE — Progress Notes (Addendum)
Progress Note    11/12/2021 7:55 AM 1 Day Post-Op  Patient overall stable. Sedated on Vent. Moving all extremities. Becomes very hypertensive and agitated with minimal stimulus   Vitals:   11/12/21 0739 11/12/21 0745  BP:    Pulse: 99 (!) 106  Resp: 20 (!) 23  Temp:    SpO2: 97% 97%   Physical Exam: General: Not in any acute distress Cardiac:  regular Lungs:  on vent Incisions: Left groin incision dressed, clean and dry. JP drain with 25 cc of bloody output, some clots present Extremities: well perfused and warm. Brisk Doppler PT signals bilaterally, palpable Right DP.Moving all extremities Abdomen:  soft, non distended Neurologic: sedated  CBC    Component Value Date/Time   WBC 13.3 (H) 11/12/2021 0320   RBC 3.84 (L) 11/12/2021 0320   HGB 11.6 (L) 11/12/2021 0320   HCT 34.8 (L) 11/12/2021 0320   PLT 108 (L) 11/12/2021 0320   MCV 90.6 11/12/2021 0320   MCH 30.2 11/12/2021 0320   MCHC 33.3 11/12/2021 0320   RDW 15.3 11/12/2021 0320   LYMPHSABS 2.5 11/06/2021 0959   MONOABS 0.5 11/06/2021 0959   EOSABS 0.2 11/06/2021 0959   BASOSABS 0.0 11/06/2021 0959    BMET    Component Value Date/Time   NA 141 11/12/2021 0320   K 3.7 11/12/2021 0320   CL 105 11/12/2021 0320   CO2 28 11/12/2021 0320   GLUCOSE 112 (H) 11/12/2021 0320   BUN 12 11/12/2021 0320   CREATININE 0.74 11/12/2021 0320   CALCIUM 8.6 (L) 11/12/2021 0320   GFRNONAA >60 11/12/2021 0320   GFRAA >60 01/14/2020 1124    INR    Component Value Date/Time   INR 1.1 11/11/2021 2147     Intake/Output Summary (Last 24 hours) at 11/12/2021 0755 Last data filed at 11/12/2021 0700 Gross per 24 hour  Intake 9999.52 ml  Output 4525 ml  Net 5474.52 ml     Assessment/Plan:  71 y.o. female is s/p thoracic branch device TEVAR complicated by iliac rupture requiring emergent left common iliac - common femoral bypass 1 Day Post-Op   Incision: Dressings clean, dry and intact. JP drain with 170 cc overnight of  bloody output CNS: Continue fentanyl / propofol. Try to wean. Moving all extremities CV: hemodynamics stable. Keep SBP 100-160.  Pulm: Plan trial of extubation today GI: NPO/OGT to wall suction. FEN/GU: UOP decreased overnight. Will increase fluids Heme: H&H stable ID: No evidence of infection. afebrile Endo: BG goal 120-180  Appreciate CCM assistance  DVT prophylaxis:  SCDs, sq Heparin   Karoline Caldwell, PA-C [facility] [phone] 11/12/2021 7:55 AM  VASCULAR STAFF ADDENDUM: I have independently interviewed and examined the patient. I agree with the above.   Looks good POD#1 thoracic branch endovascular aortic repair complicated by iliac rupture. Rupture repaired by common iliac to common femoral bypass. Moving all extremities to command. Nodding head. Opening eyes spontaneously.  Vital signs reassuring. See ICU flowsheet. Urine output tapering off, but adequate. Brisk doppler flow in left radial artery and PT arteries bilaterally; good waveform in right radial arterial line. Abdomen soft, not-distended. Appropriate tenderness. Dressings clean and dry.  Labs reassuring. WBC 13.3k; Hgb 11.6; PLTs 108. Chemistries OK. Scr 0.74 CXR personally reviewed - stent in good position. Minimal perihilar edema.   CNS: fentanyl/propofol while intubated. Transition to PCA pain control. Start mobilizing with PT / OT. Delirium precautions. CV: SBP goal 100-160. Hemodynamics OK. Pulm: Extubate today. Initiate pulmonary hygiene: IS /  OOB. GI: NPO/NGT to wall suction. Await return of bowel function. FEN/GU: Increase IVF today. Monitor urine output. Keep foley. Await return of bowel function for enteral nutrition.  Heme: blood counts stable. no need for transfusion.  ID: no evidence of infection. Endo: BG goal 120-180. Prophy: SQH. SCDs. pepcid.   Disposition: ICU.  [name]. Stanford Breed, MD [facility] Phone Number: [phone] 11/12/2021 8:07 AM

## 2021-11-12 NOTE — Procedures (Signed)
Extubation Procedure Note  Patient Details:   Name: Darlene Griffith DOB: 02-Jun-1951 MRN: [id number]   Airway Documentation:    Vent end date: 11/12/21 Vent end time: 1025   Evaluation  O2 sats: stable throughout Complications: No apparent complications Patient did tolerate procedure well. Bilateral Breath Sounds: Diminished, Rhonchi   Yes  Pt extubated per MD order. Pt placed on 2L Okay at this time. Positive cuff leak noted, no stridor heard at this time. Vitals stable throughout. RT will continue to monitor.   Tobi Bastos 11/12/2021, 10:29 AM

## 2021-11-12 NOTE — Progress Notes (Signed)
NAME:  Darlene Griffith, MRN:  [id number], DOB:  01-Jun-1951, LOS: 6 ADMISSION DATE:  11/06/2021, CONSULTATION DATE:  1/18 REFERRING MD:  Dr. Vanita Panda, CHIEF COMPLAINT:  Aortic dissection   History of Present Illness:  Patient is a 71 yo F w/ pertinent PMH of HTN, HLD, Hyperthyroidism, multinodular goiter present to [facility] ED on 1/18 with chest pain.  On 1/18, patient states she had acute onset chest pain  in upper back that radiates to her chest. EMS called. She states she has not been taking her home BP meds regularly. Arrived to [facility] ED. BP initially 136/76. Complaining of chest pain. EKG w/ sinus rhythm. Troponin and BNP WNL. CTA shows type B aortic dissection that extends into the infrarenal abdominal aorta and right common iliac artery. D-dimer 4.78. Started on Esmolol drip. Vascular surgery consulted. On 1/19 she was off all drips. Transferred to progressive and TRH on 1/20.  The evening of 1/20 the patient became increasingly hypertensive with some chest pain. Vascular surgery started the patient on cardine GTT. Transfer to ICU. PCCM consulted.    Pertinent  Medical History   Past Medical History:  Diagnosis Date   Chest pain    a. 01/2015 Echo: Nl LV fxn, Gr 1 DD, triv AI, mild MR.   Essential hypertension    Family history of lung cancer    Hepatic cyst    a. noted on CT 01/2015.   Hyperthyroidism    GOING TO [facility] FOR SECOND OPINION   Multinodular goiter    a. 01/2015 CT chest: multinodular goidter w/ substernal extension of the left lobe of the thyroid assoc w/ rightward deviation of tracheal air column.   Neck pain, chronic    Personal history of colonic polyps    Pulmonary nodules    a. 01/2015 CT Chest: RLL ~ 49mm subpleural nodule - rec f/u in 6-12 mos.   Splenic cyst    a. noted on CT 01/2015.   Significant Hospital Events: Including procedures, antibiotic start and stop dates in addition to other pertinent events   1/18: admitted to [facility]; aortic dissection; started on esmolol  drip  1/20: CTA chest  No change in descending AAA, HTN, started on cardiene GTT, TRH placed ICU transfer orders and PCCM consult 1/21 Still having CP, abdominal pain - no other acute issues overnight. Cardene weaned off  Interim History / Subjective:  This morning she is waking up from sedation, 2 daughters at bedside. Not answering questions but following commands.  Objective   Blood pressure 129/66, pulse 80, temperature 97.6 F (36.4 C), temperature source Axillary, resp. rate 10, height 5\' 1"  (1.549 m), weight 70.8 kg, SpO2 100 %.    Vent Mode: PRVC FiO2 (%):  [40 %-100 %] 40 % Set Rate:  [16 bmp-20 bmp] 20 bmp Vt Set:  [380 mL] 380 mL PEEP:  [5 cmH20-8 cmH20] 8 cmH20 Plateau Pressure:  [10 PXT06-26 cmH20] 10 cmH20   Intake/Output Summary (Last 24 hours) at 11/12/2021 0707 Last data filed at 11/12/2021 0700 Gross per 24 hour  Intake 9999.52 ml  Output 4525 ml  Net 5474.52 ml    Filed Weights   11/09/21 0500 11/11/21 0358 11/12/21 0324  Weight: 67.8 kg 68.9 kg 70.8 kg    Examination: General:  critically ill appearing woman lying in bed in NAD HEENT: [place]/AT, eyes anicteric, ETT in place, oral mucosa moist. Neuro:  following commands in all extremities, strong gag reflex. CV: S1S2, RRR PULM:  wheezing, mild rhonchi cleared  with suctioning GI: soft, NT, bloody drainage from abdominal drain Extremities: no peripheral edema in LE, mild hand edema, no cyanosis Skin: warm, dry, no rashes.  WBC 13.3 H/H 11.6/24.8 Platelets 108 CXR personally reviewed> ETT, RIJ CVC in place, no lung opacities. Endovascular aortic stent in place.   Resolved Hospital Problem list     Assessment & Plan:  Aortic Dissection: type B; extends into the infrarenal abdominal aorta and right common iliac artery s/p TEVAR 2/50 complicated by iliac artery disruption s/p left common iliac to common femoral artery bypass HTN HX HLD Hx of tobacco abuse P: -SBP goal <160; resume coreg. Can resume  amlodipine and losartan if she still needs cleviprex after extubation.  -Con't to monitor distal perfusion closely. -Post-op care per vascular surgery. Monitor drain output. -con't statin -smoking cessation counseling when appropriate  Post-op vent management -LTVV, 4-8cc/kg IBW with goal Pplat<30 and DP<15 -SAT & SBT this morning -VAP prevention protocol -PAD protocol  PEA cardiac arrest secondary to ABLA - ROSC after 94mins;  s/p 2835 ml PRBC, 307 ml PLT, 1185 ml FFP, 1 unit cryo, 3L crystalloid and 1263ml of albumin in OR - con't neuroprotective measures -monitor on tele -monitor drain output  AKI - strict I/OS - renally dose meds, avoid nephrotoxic meds - con't to monitor -maintain adequate renal perfusion  Leukocytosis, reactive -con't to monitor  Thrombocytopenia, consumptive -monitor, no need for transfusion currently  Nausea, Suspect secondary to aortic dissection -zofran and compazine PRN  Subclinical Hyperthyroidism Hx of Hyperthyroidism and multinodular goiter -needs close outpatient follow up    Hx of pulmonary nodules: 1/18 CT chest 5 mm nodule; same size as CT 2016 -no additional follow up needed for this nodule that has been stable for 6 years.  -recommend referral for outpatient lung cancer screening given ongoing tobacco abuse  Daughters updated at bedside.    Best Practice (right click and "Reselect all SmartList Selections" daily)   Diet/type: NPO DVT prophylaxis: SCD GI prophylaxis: PPI Lines: Central line and Arterial Line- R IJ CVC and R radial Aline Foley:  Yes, and it is still needed Code Status:  full code Last date of multidisciplinary goals of care discussion:     Critical Care Time: 40 min.     Julian Hy, DO 11/12/21 8:02 AM [facility]

## 2021-11-13 ENCOUNTER — Inpatient Hospital Stay: Payer: Self-pay

## 2021-11-13 ENCOUNTER — Inpatient Hospital Stay (HOSPITAL_COMMUNITY): Payer: BC Managed Care – PPO

## 2021-11-13 DIAGNOSIS — J432 Centrilobular emphysema: Secondary | ICD-10-CM

## 2021-11-13 DIAGNOSIS — N179 Acute kidney failure, unspecified: Secondary | ICD-10-CM | POA: Diagnosis not present

## 2021-11-13 DIAGNOSIS — I7100 Dissection of unspecified site of aorta: Secondary | ICD-10-CM | POA: Diagnosis not present

## 2021-11-13 DIAGNOSIS — I10 Essential (primary) hypertension: Secondary | ICD-10-CM | POA: Diagnosis not present

## 2021-11-13 LAB — GLUCOSE, CAPILLARY
Glucose-Capillary: 136 mg/dL — ABNORMAL HIGH (ref 70–99)
Glucose-Capillary: 82 mg/dL (ref 70–99)
Glucose-Capillary: 83 mg/dL (ref 70–99)
Glucose-Capillary: 85 mg/dL (ref 70–99)
Glucose-Capillary: 92 mg/dL (ref 70–99)
Glucose-Capillary: 95 mg/dL (ref 70–99)

## 2021-11-13 LAB — BASIC METABOLIC PANEL
Anion gap: 5 (ref 5–15)
Anion gap: 6 (ref 5–15)
BUN: 8 mg/dL (ref 8–23)
BUN: 7 mg/dL — ABNORMAL LOW (ref 8–23)
CO2: 29 mmol/L (ref 22–32)
CO2: 27 mmol/L (ref 22–32)
Calcium: 8.5 mg/dL — ABNORMAL LOW (ref 8.9–10.3)
Calcium: 8.3 mg/dL — ABNORMAL LOW (ref 8.9–10.3)
Chloride: 102 mmol/L (ref 98–111)
Chloride: 101 mmol/L (ref 98–111)
Creatinine, Ser: 0.48 mg/dL (ref 0.44–1.00)
Creatinine, Ser: 0.49 mg/dL (ref 0.44–1.00)
GFR, Estimated: >60 mL/min (ref >60)
GFR, Estimated: >60 mL/min (ref >60)
Glucose, Bld: 95 mg/dL (ref 70–99)
Glucose, Bld: 81 mg/dL (ref 70–99)
Potassium: 3.3 mmol/L — ABNORMAL LOW (ref 3.5–5.1)
Potassium: 4.2 mmol/L (ref 3.5–5.1)
Sodium: 136 mmol/L (ref 135–145)
Sodium: 134 mmol/L — ABNORMAL LOW (ref 135–145)

## 2021-11-13 LAB — CBC
HCT: 30.9 % — ABNORMAL LOW (ref 36.0–46.0)
Hemoglobin: 10.2 g/dL — ABNORMAL LOW (ref 12.0–15.0)
MCH: 30.9 pg (ref 26.0–34.0)
MCHC: 33 g/dL (ref 30.0–36.0)
MCV: 93.6 fL (ref 80.0–100.0)
Platelets: 111 10*3/uL — ABNORMAL LOW (ref 150–400)
RBC: 3.3 MIL/uL — ABNORMAL LOW (ref 3.87–5.11)
RDW: 14.8 % (ref 11.5–15.5)
WBC: 14.7 10*3/uL — ABNORMAL HIGH (ref 4.0–10.5)
nRBC: 0 % (ref 0.0–0.2)

## 2021-11-13 LAB — MAGNESIUM: Magnesium: 1.7 mg/dL (ref 1.7–2.4)

## 2021-11-13 MED ORDER — LIDOCAINE 5 % EX OINT
TOPICAL_OINTMENT | Freq: Four times a day (QID) | CUTANEOUS | Status: DC | PRN
  Filled 2021-11-13: qty 35.44

## 2021-11-13 MED ORDER — POTASSIUM CHLORIDE 10 MEQ/100ML IV SOLN: 10.0000 meq | INTRAVENOUS | Status: DC

## 2021-11-13 MED ORDER — MAGNESIUM SULFATE 2 GM/50ML IV SOLN
2.0000 g | Freq: Once | INTRAVENOUS | Status: AC
  Administered 2021-11-13: 20:00:00 2 g via INTRAVENOUS
  Filled 2021-11-13: qty 50

## 2021-11-13 MED ORDER — IOHEXOL 350 MG/ML SOLN
100.0000 mL | Freq: Once | INTRAVENOUS | Status: AC | PRN
  Administered 2021-11-13: 06:00:00 100 mL via INTRAVENOUS

## 2021-11-13 MED ORDER — ACETAMINOPHEN 325 MG PO TABS: 650.0000 mg | ORAL_TABLET | Freq: Four times a day (QID) | ORAL | Status: DC

## 2021-11-13 MED ORDER — ACETAMINOPHEN 650 MG RE SUPP
650.0000 mg | Freq: Four times a day (QID) | RECTAL | Status: DC
  Administered 2021-11-13 – 2021-11-15 (×8): 650 mg via RECTAL
  Filled 2021-11-13 (×8): qty 1

## 2021-11-13 MED ORDER — LIDOCAINE 4 % EX CREA
TOPICAL_CREAM | Freq: Two times a day (BID) | CUTANEOUS | Status: DC | PRN
  Administered 2021-11-13 – 2021-11-19 (×6): 1 via TOPICAL
  Filled 2021-11-13 (×2): qty 5

## 2021-11-13 MED ORDER — OXYCODONE HCL 5 MG PO TABS
5.0000 mg | ORAL_TABLET | ORAL | Status: DC | PRN
  Administered 2021-11-15 (×2): 5 mg via ORAL
  Administered 2021-11-15 – 2021-11-21 (×21): 10 mg via ORAL
  Filled 2021-11-13 (×3): qty 2
  Filled 2021-11-13 (×2): qty 1
  Filled 2021-11-13 (×3): qty 2
  Filled 2021-11-13: qty 1
  Filled 2021-11-13 (×4): qty 2
  Filled 2021-11-13: qty 1
  Filled 2021-11-13 (×2): qty 2
  Filled 2021-11-13: qty 1
  Filled 2021-11-13 (×3): qty 2
  Filled 2021-11-13: qty 1
  Filled 2021-11-13 (×4): qty 2

## 2021-11-13 MED ORDER — SODIUM CHLORIDE 0.9% FLUSH
10.0000 mL | Freq: Two times a day (BID) | INTRAVENOUS | Status: DC
  Administered 2021-11-13 – 2021-11-15 (×5): 10 mL
  Administered 2021-11-16: 30 mL
  Administered 2021-11-16 – 2021-11-21 (×8): 10 mL

## 2021-11-13 MED ORDER — METHYLNALTREXONE BROMIDE 12 MG/0.6ML ~~LOC~~ SOLN
12.0000 mg | Freq: Once | SUBCUTANEOUS | Status: AC
  Administered 2021-11-13: 10:00:00 12 mg via SUBCUTANEOUS
  Filled 2021-11-13: qty 0.6

## 2021-11-13 MED ORDER — EPINEPHRINE 1 MG/10ML IJ SOSY
PREFILLED_SYRINGE | INTRAMUSCULAR | Status: AC
  Filled 2021-11-13: qty 10

## 2021-11-13 MED ORDER — REVEFENACIN 175 MCG/3ML IN SOLN
175.0000 ug | Freq: Every day | RESPIRATORY_TRACT | Status: DC
  Administered 2021-11-14 – 2021-11-21 (×5): 175 ug via RESPIRATORY_TRACT
  Filled 2021-11-13 (×10): qty 3

## 2021-11-13 MED ORDER — POTASSIUM CHLORIDE 20 MEQ PO PACK: 20.0000 meq | PACK | ORAL | Status: DC

## 2021-11-13 MED ORDER — SODIUM CHLORIDE 0.9% FLUSH
10.0000 mL | INTRAVENOUS | Status: DC | PRN
  Administered 2021-11-18: 10 mL

## 2021-11-13 MED ORDER — VANCOMYCIN HCL 1500 MG/300ML IV SOLN
1500.0000 mg | Freq: Once | INTRAVENOUS | Status: AC
  Administered 2021-11-13: 11:00:00 1500 mg via INTRAVENOUS
  Filled 2021-11-13: qty 300

## 2021-11-13 MED ORDER — POTASSIUM CHLORIDE 10 MEQ/50ML IV SOLN
10.0000 meq | INTRAVENOUS | Status: AC
  Administered 2021-11-13 (×6): 10 meq via INTRAVENOUS
  Filled 2021-11-13 (×6): qty 50

## 2021-11-13 MED ORDER — VANCOMYCIN HCL 1250 MG/250ML IV SOLN
1250.0000 mg | INTRAVENOUS | Status: DC
  Administered 2021-11-14: 1250 mg via INTRAVENOUS
  Filled 2021-11-13 (×2): qty 250

## 2021-11-13 MED ORDER — HYDROMORPHONE HCL 1 MG/ML IJ SOLN
1.0000 mg | Freq: Once | INTRAMUSCULAR | Status: AC
  Administered 2021-11-13: 04:00:00 1 mg via INTRAVENOUS
  Filled 2021-11-13: qty 1

## 2021-11-13 MED ORDER — HYDROMORPHONE HCL 1 MG/ML IJ SOLN
0.5000 mg | INTRAMUSCULAR | Status: DC | PRN
  Administered 2021-11-13 – 2021-11-20 (×20): 0.5 mg via INTRAVENOUS
  Filled 2021-11-13 (×21): qty 0.5

## 2021-11-13 MED ORDER — SODIUM CHLORIDE 0.9 % IV SOLN: 1.0000 g | Freq: Two times a day (BID) | INTRAVENOUS | Status: DC

## 2021-11-13 MED ORDER — PANTOPRAZOLE SODIUM 40 MG IV SOLR
40.0000 mg | Freq: Every day | INTRAVENOUS | Status: DC
  Administered 2021-11-13 – 2021-11-15 (×3): 40 mg via INTRAVENOUS
  Filled 2021-11-13 (×3): qty 40

## 2021-11-13 MED ORDER — SODIUM CHLORIDE 0.9 % IV SOLN
2.0000 g | Freq: Three times a day (TID) | INTRAVENOUS | Status: DC
  Administered 2021-11-13 – 2021-11-15 (×6): 2 g via INTRAVENOUS
  Filled 2021-11-13 (×6): qty 2

## 2021-11-13 MED FILL — Sodium Chloride Irrigation Soln 0.9%: Qty: 500 | Status: AC

## 2021-11-13 MED FILL — Sodium Chloride IV Soln 0.9%: INTRAVENOUS | Qty: 1000 | Status: AC

## 2021-11-13 MED FILL — Heparin Sodium (Porcine) Inj 1000 Unit/ML: INTRAMUSCULAR | Qty: 30 | Status: AC

## 2021-11-13 NOTE — Progress Notes (Addendum)
Aortic Surgery Progress Note    11/13/2021 7:21 AM 2 Days Post-Op  Subjective:  says she hurts from her neck down to where her legs connect to her body.  Says she is having burning pain in her back similar to before surgery.  Says she is having abdominal pain.  CTA done this morning.   States that she does not want to know much about her surgery.  Says that she is in too much pain to walk.    Afebrile HR 70's-90's NSR 329'J-188'C systolic 16% [id number]  Gtts:  none   Vitals:   11/13/21 0413 11/13/21 0500  BP:  124/68  Pulse:  87  Resp: 18 13  Temp:    SpO2: 97% 99%    Physical Exam: Cardiac:  regular Lungs:  extubated yesterday.  On 2LO2NC Abdomen:  soft; slightly tender to palpation near incisions Incisions:  left lower abdominal incision clean with staples in tact and minimal bloody drainage mid portion on the bandage.  .  Extremities:  easily palpable right DP pulse and brisk biphasic doppler signal left PT.  Bilateral feet are warm and motor in tact.  General:  deconditioned.   CBC    Component Value Date/Time   WBC 14.7 (H) 11/13/2021 0409   RBC 3.30 (L) 11/13/2021 0409   HGB 10.2 (L) 11/13/2021 0409   HCT 30.9 (L) 11/13/2021 0409   PLT 111 (L) 11/13/2021 0409   MCV 93.6 11/13/2021 0409   MCH 30.9 11/13/2021 0409   MCHC 33.0 11/13/2021 0409   RDW 14.8 11/13/2021 0409   LYMPHSABS 2.5 11/06/2021 0959   MONOABS 0.5 11/06/2021 0959   EOSABS 0.2 11/06/2021 0959   BASOSABS 0.0 11/06/2021 0959    BMET    Component Value Date/Time   NA 136 11/13/2021 0409   K 3.3 (L) 11/13/2021 0409   CL 102 11/13/2021 0409   CO2 29 11/13/2021 0409   GLUCOSE 95 11/13/2021 0409   BUN 8 11/13/2021 0409   CREATININE 0.48 11/13/2021 0409   CALCIUM 8.5 (L) 11/13/2021 0409   GFRNONAA >60 11/13/2021 0409   GFRAA >60 01/14/2020 1124    INR    Component Value Date/Time   INR 1.1 11/11/2021 2147     Intake/Output Summary (Last 24 hours) at 11/13/2021 0721 Last data filed at  11/13/2021 0600 Gross per 24 hour  Intake 2608.45 ml  Output 1340 ml  Net 1268.45 ml   CTA 11/13/2021: IMPRESSION: 1. Status post branched endovascular aortic repair of the thoracic aortic aneurysm/dissection, with maintained patency of the arch vessels and decreasing size of the false lumen throughout the thoracic aorta. 2. Status post open left common iliac to left common femoral artery bypass graft with small amount of resolving blood products along the left pelvic sidewall and retroperitoneum, but no large well-defined postoperative hematoma. 3. High-grade stenosis versus complete occlusion at the ostium of the left internal iliac artery. The left internal artery is patent, however, potentially fed by extensive collateral vasculature. 4. Trace right and small left pleural effusions lying dependently with extensive areas of passive subsegmental atelectasis in the lower lobes of the lungs bilaterally. 5. New wedge-shaped area of ground-glass attenuation in the right upper lobe near the apex. The pulmonary artery branches extending to this region appear patent. This is presumably of infectious or inflammatory etiology. Attention on follow-up studies is recommended to ensure resolution of this finding. 6. Thyroid goiter with substernal extension redemonstrated, as above. 7. Morphologic changes in the liver suggesting underlying cirrhosis.  8. Additional incidental findings, as above.  PCXR 11/12/2021: IMPRESSION: 1. NGT tip is either in the most distal stomach or in the proximal descending duodenum. 2. Left lower lobe consolidation and small left pleural effusion. 3. Skin staples are newly visualized extending horizontally across the left lower abdomen.   JP drain output: 35cc/24hr (10cc last shift)   Assessment/Plan:  71 y.o. female is s/p  thoracic branch endovascular aortic repair complicated by iliac rupture. Rupture repaired by common iliac to common femoral bypass 2  Days Post-Op  -Vascular:  On CTA, High-grade stenosis versus complete occlusion at the ostium of the left internal iliac artery. The left internal artery is patent,however, potentially fed by extensive collateral vasculature and pt with brisk biphasic doppler signals left PT and palpable right DP.   CTA this am shows decrease in size of false lumen.   -Cardiac:  hemodynamically stable but somewhat hypertensive when we were talking this am.   She has prn hydralazine ordered.  RN reports that overnight, this was improved.   -Pulmonary:  pt was extubated yesterday and currently 98% on 2LO2NC.   -Neuro:  in tact -Renal:  BUN/creatinine are normal; hypokalemia at 3.3-supplemented -GI:  abdomen with mild tenderness near incisions.  -Incisions:  clean and dry with staples in tact with minimal bloody drainage mid incision on bandage.  No active bleeding. -Heme/ID:  acute blood loss anemia-hgb stable at 10.2 this am.   -General:  overall, deconditioned.  Needs PT/OT and start mobilizing.  Continue dilaudid PCA for pain.  Benadryl for itching is ordered.  Supplement with Tylenol to help with pain control.    Leontine Locket, PA-C [facility] [phone] 11/13/2021 7:21 AM

## 2021-11-13 NOTE — Progress Notes (Signed)
NAME:  Darlene Griffith, MRN:  [id number], DOB:  Jan 31, 1951, LOS: 7 ADMISSION DATE:  11/06/2021, CONSULTATION DATE:  1/18 REFERRING MD:  Dr. Vanita Panda, CHIEF COMPLAINT:  Aortic dissection   History of Present Illness:  Patient is a 71 yo F w/ pertinent PMH of HTN, HLD, Hyperthyroidism, multinodular goiter present to [facility] ED on 1/18 with chest pain.  On 1/18, patient states she had acute onset chest pain  in upper back that radiates to her chest. EMS called. She states she has not been taking her home BP meds regularly. Arrived to [facility] ED. BP initially 136/76. Complaining of chest pain. EKG w/ sinus rhythm. Troponin and BNP WNL. CTA shows type B aortic dissection that extends into the infrarenal abdominal aorta and right common iliac artery. D-dimer 4.78. Started on Esmolol drip. Vascular surgery consulted. On 1/19 she was off all drips. Transferred to progressive and TRH on 1/20.  The evening of 1/20 the patient became increasingly hypertensive with some chest pain. Vascular surgery started the patient on cardine GTT. Transfer to ICU. PCCM consulted.    Pertinent  Medical History   Past Medical History:  Diagnosis Date   Chest pain    a. 01/2015 Echo: Nl LV fxn, Gr 1 DD, triv AI, mild MR.   Essential hypertension    Family history of lung cancer    Hepatic cyst    a. noted on CT 01/2015.   Hyperthyroidism    GOING TO [facility] FOR SECOND OPINION   Multinodular goiter    a. 01/2015 CT chest: multinodular goidter w/ substernal extension of the left lobe of the thyroid assoc w/ rightward deviation of tracheal air column.   Neck pain, chronic    Personal history of colonic polyps    Pulmonary nodules    a. 01/2015 CT Chest: RLL ~ 36mm subpleural nodule - rec f/u in 6-12 mos.   Splenic cyst    a. noted on CT 01/2015.   Significant Hospital Events: Including procedures, antibiotic start and stop dates in addition to other pertinent events   1/18: admitted to [facility]; aortic dissection; started on esmolol  drip  1/20: CTA chest  No change in descending AAA, HTN, started on cardiene GTT, TRH placed ICU transfer orders and PCCM consult 1/21 Still having CP, abdominal pain - no other acute issues overnight. Cardene weaned off  Interim History / Subjective:  Uncontrolled pain-- back, chest, abdomen. NGT replaced overnight. Family worried about nutrition. Left foot tingling overnight- resolved now.  Objective   Blood pressure (!) 143/72, pulse 87, temperature 98.4 F (36.9 C), temperature source Oral, resp. rate 17, height 5\' 1"  (1.549 m), weight 72.4 kg, SpO2 96 %.    FiO2 (%):  [30 %] 30 %   Intake/Output Summary (Last 24 hours) at 11/13/2021 0744 Last data filed at 11/13/2021 0700 Gross per 24 hour  Intake 2689.8 ml  Output 1340 ml  Net 1349.8 ml    Filed Weights   11/11/21 0358 11/12/21 0324 11/13/21 0500  Weight: 68.9 kg 70.8 kg 72.4 kg    NG output 400cc UOP 905  Examination: General:  ill appearing woman lying in bed in NAD HEENT: [place]/AT, eyes anicteric Neuro:  Awake, not moving much but able to move all extremities on command-symmetric squeeze, wiggles toes. Answering questions appropriately, but mostly keeping her eyes closed. Full strength testing not performed due to uncontrolled pain. CV: S1S2, RRR, hypertensive. PULM:  Breathing comfortably on [place], no rhonchi or wheezing. GI: Soft, TTP especially  on the LLQ near her incision. Dressing c/d/i Extremities: mild edema, no cyanosis Skin: warm, dry, no rashes  WBC 14.7 H/H 10.2/30.9 Platelets 111 BUN 8 Cr 0.48  CT C/A/P- left dependent pleural effusion, smaller R pleural effusion. Large heterogenous multinodular goiter extending into substernal mediastinum. Arch and proximal descending aortic graft in place, distal aorta with similar size false lumen and true lumen compared to 1/20.  Resolved Hospital Problem list     Assessment & Plan:  Aortic Dissection: type B; extends into the infrarenal abdominal aorta and right  common iliac artery s/p TEVAR 7/82 complicated by iliac artery disruption s/p left common iliac to common femoral artery bypass. HTN HX HLD Hx of tobacco abuse P: -Goal SBP <160; not able to take enteral meds if NG output remains high -resume coreg, statin when able to take PO -con't nicardipine to meet BP goals; must remain in ICU until off cleviprex -Con't to monitor distal perfusion. -Post-op care per vascular surgery.  -recommend quitting smoking entirely, can counsel more when appropriate -con't NGT to suction while awaiting bowel function return  Post-op vent management- extubated POD #1 on 1/24  -ETCO2 while PCA in use -wean supplemental O2 as needed to maintain SpO2 >90%  PEA cardiac arrest secondary to ABLA intra-operatively - ROSC after 33mins;  s/p 2835 ml PRBC, 307 ml PLT, 1185 ml FFP, 1 unit cryo, 3L crystalloid and 121ml of albumin in OR  -con't supportive care measures, neuroprotective measures -monitor on tele  Uncontrolled pain-- some chest wall pain from compressions, some incisional -dilaudid PRNs in addition to PCA pump-- no ability to adjust pump rates -adding topical lidocaine  -tylenol rectally -would like to add robaxin for chest wall pain, but not an option until able to take POs  AKI, improving. - strict I/O -renally dose meds, avoid nephrotoxic meds -monitor -maintain adequate renal perfusion  Leukocytosis, reactive -con't to monitor  Thrombocytopenia, consumptive -con't to monitor -no need for transfusions acutely  Nausea, Suspect secondary to aortic dissection -zofran & compazine PRN  Subclinical Hyperthyroidism Hx of Hyperthyroidism and multinodular goiter extending into mediastinum> biopsy in 2022 benign. -needs close outpatient follow up    Hx of pulmonary nodules: 1/18 CT chest 5 mm nodule; same size as CT 2016 -no additional follow up needed for this nodule that has been stable for 6 years.  -recommend referral for outpatient lung  cancer screening given ongoing tobacco abuse  2 daughters updated at bedside today.    Best Practice (right click and "Reselect all SmartList Selections" daily)   Diet/type: NPO DVT prophylaxis: SCD GI prophylaxis: PPI Lines: Central line and Arterial Line- R IJ CVC and R radial Aline Foley:  Yes, and it is still needed Code Status:  full code Last date of multidisciplinary goals of care discussion:     Critical Care Time:  40 min      Julian Hy, DO 11/13/21 7:44 AM [facility]

## 2021-11-13 NOTE — Progress Notes (Addendum)
Inpatient Rehab Admissions Coordinator Note:   Per therapy recommendations patient was screened for CIR candidacy by Michel Santee, PT. At this time, pt appears to be a potential candidate for CIR, though will need to begin mobilizing out of bed without use of hoyer as soon as possible.  I will place an order for rehab consult for full assessment, per our protocol.  Please contact me any with questions.Shann Medal, PT, DPT [phone] 11/13/21 1:08 PM

## 2021-11-13 NOTE — Evaluation (Signed)
Occupational Therapy Evaluation Patient Details Name: Darlene Griffith MRN: [id number] DOB: 12/08/1950 Today's Date: 11/13/2021   History of Present Illness 71 y.o. female who presented 11/06/21 with acute onset CP. CTA shows type B aortic dissection that extends into the infrarenal abdominal aorta and right common iliac artery. S/p thoracic branch device TEVAR complicated by iliac rupture requiring emergent left common iliac - common femoral bypass 1/23. ETT 1/23 - 1/24. PMH: HTN, hepatic cyst, hyperthyroidism, multinodular goiter, pulmonary and colonic polyps, splenic cyst   Clinical Impression   Patient is s/p TEVAR complicated by ruptured left common iliac common femoral bypass surgery resulting in functional limitations due to the deficits listed below (see OT problem list). Pt required hoyer lift to chair this session to help progress patient and she reports decreased back pain. Pt educated and notified that next session would be eob dangle to help her start to activate static sitting. Pt expressed understanding. Pt limited by pain and anxiety about potential pain from movement. This session was to help build confidence in the ability to be upright in chair. Pt tolerating well at this time and expressed wanting to drink from cup in upright position.  Patient will benefit from skilled OT acutely to increase independence and safety with ADLS to allow discharge CIR.       Recommendations for follow up therapy are one component of a multi-disciplinary discharge planning process, led by the attending physician.  Recommendations may be updated based on patient status, additional functional criteria and insurance authorization.   Follow Up Recommendations  Acute inpatient rehab (3hours/day)    Assistance Recommended at Discharge    Patient can return home with the following A lot of help with walking and/or transfers;A lot of help with bathing/dressing/bathroom;Assist for transportation     Functional Status Assessment  Patient has had a recent decline in their functional status and demonstrates the ability to make significant improvements in function in a reasonable and predictable amount of time.  Equipment Recommendations  BSC/3in1    Recommendations for Other Services Rehab consult     Precautions / Restrictions Precautions Precautions: Fall;Other (comment) Precaution Comments: A-line, L JP drain Restrictions Weight Bearing Restrictions: No      Mobility Bed Mobility Overal bed mobility: Needs Assistance Bed Mobility: Rolling Rolling: Mod assist, +2 for safety/equipment         General bed mobility comments: Cues provided to flex knee and place foot on bed to push through to roll trunk, needed assistance for hand placement on abdomen for comfort rolling either direction for hoyer lift pad placement, modA to complete.    Transfers Overall transfer level: Needs assistance   Transfers: Bed to chair/wheelchair/BSC             General transfer comment: total (A) for hoyer placement and transfer to chair. pt with multiple lines /leads to manage with transfer requiring skilled hands Transfer via Lift Equipment: [place]                                           ADL either performed or assessed with clinical judgement   ADL Overall ADL's : Needs assistance/impaired Eating/Feeding: Moderate assistance   Grooming: Moderate assistance   Upper Body Bathing: Maximal assistance   Lower Body Bathing: Maximal assistance   Upper Body Dressing : Maximal assistance   Lower Body Dressing: Maximal assistance  General ADL Comments: pt hoyer lifted to chair to help progress to upright posture and help to mentally progress in task. pt tolerated hoyer and reports that it "wasnt bad"     Vision   Additional Comments: vision occlusion will need further assesment     Perception     Praxis      Pertinent  Vitals/Pain Pain Assessment Pain Assessment: Faces Faces Pain Scale: Hurts whole lot Pain Location: L arm and knee with mobility, back, abdomen Pain Descriptors / Indicators: Discomfort, Grimacing, Guarding Pain Intervention(s): Monitored during session, Premedicated before session, Repositioned, PCA encouraged     Hand Dominance Left   Extremity/Trunk Assessment Upper Extremity Assessment Upper Extremity Assessment: Generalized weakness;RUE deficits/detail;LUE deficits/detail RUE Deficits / Details: aline with block limiting hand arm LUE Deficits / Details: reports discomfort in shoulder. pt tolerates positioning on pillow but very sensitive to tactile input   Lower Extremity Assessment Lower Extremity Assessment: RLE deficits/detail;LLE deficits/detail RLE Deficits / Details: MMT score of 4 knee extension, can flex knee and hip in bed against gravity with minA; detects touch at feet RLE Sensation: WNL RLE Coordination: decreased gross motor LLE Deficits / Details: MMT score of 3+ knee extension (limited by pain), can flex knee and hip in bed against gravity with minA; detects touch at feet LLE Sensation: WNL LLE Coordination: decreased gross motor   Cervical / Trunk Assessment Cervical / Trunk Assessment: Other exceptions Cervical / Trunk Exceptions: back pain   Communication Communication Communication: No difficulties   Cognition Arousal/Alertness: Lethargic Behavior During Therapy: Impulsive Overall Cognitive Status: No family/caregiver present to determine baseline cognitive functioning                                 General Comments: pt keeping eyes closed 90% of session. pt does reports decrease pain in back with sitting in chair. pt following 1 step commands. Pt making request for drink and reaching to drink it appropriately     General Comments  VSS 2L [place] pushing PCA and RN giving medications    Exercises Exercises: Other exercises Other  Exercises Other Exercises: cues to open close hands for edema   Shoulder Instructions      Home Living Family/patient expects to be discharged to:: Private residence Living Arrangements: Alone Available Help at Discharge: Family;Available 24 hours/day Type of Home: House Home Access: Stairs to enter [employer] of Steps: 2 Entrance Stairs-Rails: None Home Layout: One level     Bathroom Shower/Tub: Teacher, early years/pre: Standard     Home Equipment: BSC/3in1          Prior Functioning/Environment Prior Level of Function : Independent/Modified Independent;Driving;History of Falls (last six months)             Mobility Comments: Does not use an AD but reports multiple recent falls. ADLs Comments: Not driving as often anymore.        OT Problem List: Decreased strength;Decreased activity tolerance;Impaired balance (sitting and/or standing);Decreased cognition;Decreased safety awareness;Decreased knowledge of use of DME or AE;Decreased knowledge of precautions;Cardiopulmonary status limiting activity;Impaired sensation;Impaired UE functional use;Pain      OT Treatment/Interventions: Self-care/ADL training;Therapeutic exercise;Energy conservation;DME and/or AE instruction;Manual therapy;Modalities;Therapeutic activities;Cognitive remediation/compensation;Patient/family education;Balance training    OT Goals(Current goals can be found in the care plan section) Acute Rehab OT Goals Patient Stated Goal: to drink some wate OT Goal Formulation: Patient unable to participate in goal setting Time For Goal Achievement:  11/27/21 Potential to Achieve Goals: Good  OT Frequency: Min 2X/week    Co-evaluation PT/OT/SLP Co-Evaluation/Treatment: Yes Reason for Co-Treatment: Complexity of the patient's impairments (multi-system involvement);Necessary to address cognition/behavior during functional activity;For patient/therapist safety;To address functional/ADL  transfers PT goals addressed during session: Mobility/safety with mobility OT goals addressed during session: ADL's and self-care;Proper use of Adaptive equipment and DME;Strengthening/ROM      AM-PAC OT "6 Clicks" Daily Activity     Outcome Measure Help from another person eating meals?: A Little Help from another person taking care of personal grooming?: A Little Help from another person toileting, which includes using toliet, bedpan, or urinal?: A Lot Help from another person bathing (including washing, rinsing, drying)?: A Lot Help from another person to put on and taking off regular upper body clothing?: A Lot Help from another person to put on and taking off regular lower body clothing?: A Lot 6 Click Score: 14   End of Session Equipment Utilized During Treatment: Oxygen Nurse Communication: Mobility status;Precautions;Need for lift equipment  Activity Tolerance: Patient tolerated treatment well Patient left: in chair;with call bell/phone within reach;with chair alarm set;with nursing/sitter in room  OT Visit Diagnosis: Unsteadiness on feet (R26.81)                Time: 3013-1438 OT Time Calculation (min): 21 min Charges:  OT General Charges $OT Visit: 1 Visit OT Evaluation $OT Eval High Complexity: 1 High   Brynn, OTR/L  Acute Rehabilitation Services Pager: [phone] Office: [phone] .   Jeri Modena 11/13/2021, 11:53 AM

## 2021-11-13 NOTE — Progress Notes (Signed)
C/o chest pain worsening compared to previous. Same character as her dissection. Duladed PCA helping partially.  - give extra dose of 1mg  dilaudid - stat CTA - elink informed

## 2021-11-13 NOTE — Progress Notes (Signed)
°  Pharmacy Antibiotic Note  Darlene Griffith is a 71 y.o. female admitted on 11/06/2021  for thoracic aneurysm/dissection and repair - now  with pneumonia.  Tm 98 wbc elevated 14.7 cr 0.5 stable crcl 80ml/min. Pharmacy has been consulted for vancomycin and cefepime dosing.  Plan: Vancomycin 1500mg  x1 then 1250mg  IV q24hr  Cefepime 2gm IV q8h  Height: 5\' 1"  (154.9 cm) Weight: 72.4 kg (159 lb 9.8 oz) IBW/kg (Calculated) : 47.8  Temp (24hrs), Avg:98.3 F (36.8 C), Min:98.1 F (36.7 C), Max:98.4 F (36.9 C)  Recent Labs  Lab 11/06/21 1345 11/06/21 1621 11/07/21 0146 11/10/21 0416 11/11/21 0234 11/11/21 1401 11/11/21 2146 11/11/21 2147 11/12/21 0320 11/12/21 0828 11/12/21 1623 11/13/21 0409  WBC  --   --    < > 11.8* 10.1 9.3  --  13.2* 13.3* 13.8* 12.5* 14.7*  CREATININE  --   --    < > 0.50 0.45 0.41*  --   --  0.74  --   --  0.48  LATICACIDVEN 1.3 1.3  --   --   --  1.3 0.9  --   --   --   --   --    < > = values in this interval not displayed.    Estimated Creatinine Clearance: 59.5 mL/min (by C-G formula based on SCr of 0.48 mg/dL).    Allergies  Allergen Reactions   Shellfish Allergy Anaphylaxis    Antimicrobials this admission: Post op ancef complete 1/24  Dose adjustments this admission:   Microbiology results:    Bonnita Nasuti Pharm.D. CPP, BCPS Clinical Pharmacist [phone] 11/13/2021 11:14 AM

## 2021-11-13 NOTE — Progress Notes (Signed)
Peripherally Inserted Central Catheter Placement  The IV Nurse has discussed with the patient and/or persons authorized to consent for the patient, the purpose of this procedure and the potential benefits and risks involved with this procedure.  The benefits include less needle sticks, lab draws from the catheter, and the patient may be discharged home with the catheter. Risks include, but not limited to, infection, bleeding, blood clot (thrombus formation), and puncture of an artery; nerve damage and irregular heartbeat and possibility to perform a PICC exchange if needed/ordered by physician.  Alternatives to this procedure were also discussed.  Bard Power PICC patient education guide, fact sheet on infection prevention and patient information card has been provided to patient /or left at bedside.    Consent obtained with daughter at bedside  PICC Placement Documentation  PICC Double Lumen 20/72/18 PICC Right Basilic 38 cm 0 cm (Active)  Indication for Insertion or Continuance of Line Prolonged intravenous therapies 11/13/21 1800  Exposed Catheter (cm) 0 cm 11/13/21 1800  Site Assessment Clean;Dry;Intact 11/13/21 1800  Lumen #1 Status Flushed;Saline locked;Blood return noted 11/13/21 1800  Lumen #2 Status Flushed;Saline locked;Blood return noted 11/13/21 1800  Dressing Type Transparent;Securing device 11/13/21 1800  Dressing Status Clean;Dry;Intact 11/13/21 1800  Antimicrobial disc in place? Yes 11/13/21 1800  Safety Lock Not Applicable 28/83/37 4451  Line Care Connections checked and tightened 11/13/21 1800  Dressing Intervention New dressing 11/13/21 1800  Dressing Change Due 11/20/21 11/13/21 1800       Darlyn Read 11/13/2021, 6:19 PM

## 2021-11-13 NOTE — Progress Notes (Signed)
Confirmed with Radiology that patient does not need prophylactic medications for CT contrast given she does have a shellfish allergy.

## 2021-11-13 NOTE — Evaluation (Signed)
Physical Therapy Evaluation Patient Details Name: Darlene Griffith MRN: [id number] DOB: September 10, 1951 Today's Date: 11/13/2021  History of Present Illness  Pt is a 71 y.o. female who presented 11/06/21 with acute onset CP. EKG w/ sinus rhythm. Troponin and BNP WNL. CTA shows type B aortic dissection that extends into the infrarenal abdominal aorta and right common iliac artery. S/p thoracic branch device TEVAR complicated by iliac rupture requiring emergent left common iliac - common femoral bypass 1/23. ETT 1/23 - 1/24. PMH: HTN, hepatic cyst, hyperthyroidism, multinodular goiter, pulmonary and colonic polyps, splenic cyst   Clinical Impression  Pt presents with condition above and deficits mentioned below, see PT Problem List. PTA, she was independent with functional mobility without an AD, living alone in a 1-level house with 2 STE. Currently, pt is limited in mobility by pain and lethargy. However, she was able to tolerate the pain to be transferred OOB to the chair using the maxi-sky lift today, expressing that the pain did improve once upright in the chair. She displays deficits in gross strength and activity tolerance also limiting her mobility, with pt requiring modA to roll either direction in bed. Provided pt's pain and arousal levels improve, she would greatly benefit from intensive therapy in the AIR setting to maximize her return to her baseline. Will continue to follow acutely.     Recommendations for follow up therapy are one component of a multi-disciplinary discharge planning process, led by the attending physician.  Recommendations may be updated based on patient status, additional functional criteria and insurance authorization.  Follow Up Recommendations Acute inpatient rehab (3hours/day)    Assistance Recommended at Discharge Frequent or constant Supervision/Assistance  Patient can return home with the following  Two people to help with walking and/or transfers;A lot of help with  walking and/or transfers;A lot of help with bathing/dressing/bathroom;Two people to help with bathing/dressing/bathroom;Assistance with cooking/housework;Assist for transportation;Help with stairs or ramp for entrance    Equipment Recommendations  (TBA pending progression)  Recommendations for Other Services  Rehab consult    Functional Status Assessment Patient has had a recent decline in their functional status and demonstrates the ability to make significant improvements in function in a reasonable and predictable amount of time.     Precautions / Restrictions Precautions Precautions: Fall;Other (comment) Precaution Comments: A-line, L JP drain Restrictions Weight Bearing Restrictions: No      Mobility  Bed Mobility Overal bed mobility: Needs Assistance Bed Mobility: Rolling Rolling: Mod assist, +2 for safety/equipment         General bed mobility comments: Cues provided to flex knee and place foot on bed to push through to roll trunk, needed assistance for hand placement on abdomen for comfort rolling either direction for hoyer lift pad placement, modA to complete.    Transfers Overall transfer level: Needs assistance   Transfers: Bed to chair/wheelchair/BSC             General transfer comment: TA for hoyser lift transfer from bed to chair with personnel managing lines/leads for safety. Positioned pt with pillows under UEs and on her R side to facilitate midline sitting posture in recliner. Transfer via Lift Equipment: [place]  Ambulation/Gait               General Gait Details: Deferred due to pain this date  Stairs            Wheelchair Mobility    Modified Rankin (Stroke Patients Only)       Balance  Sitting balance - Comments: deferred       Standing balance comment: deferred                             Pertinent Vitals/Pain Pain Assessment Pain Assessment: Faces Faces Pain Scale: Hurts whole lot Pain  Location: L arm and knee with mobility, back, abdomen Pain Descriptors / Indicators: Discomfort, Grimacing, Guarding Pain Intervention(s): Limited activity within patient's tolerance, Monitored during session, Repositioned, Patient requesting pain meds-RN notified    Home Living Family/patient expects to be discharged to:: Private residence Living Arrangements: Alone Available Help at Discharge: Family;Available 24 hours/day Type of Home: House Home Access: Stairs to enter Entrance Stairs-Rails: None Entrance Stairs-Number of Steps: 2   Home Layout: One level Home Equipment: BSC/3in1      Prior Function Prior Level of Function : Independent/Modified Independent;Driving;History of Falls (last six months)             Mobility Comments: Does not use an AD but reports multiple recent falls. ADLs Comments: Not driving as often anymore.     Hand Dominance        Extremity/Trunk Assessment   Upper Extremity Assessment Upper Extremity Assessment: Defer to OT evaluation    Lower Extremity Assessment Lower Extremity Assessment: RLE deficits/detail;LLE deficits/detail RLE Deficits / Details: MMT score of 4 knee extension, can flex knee and hip in bed against gravity with minA; detects touch at feet RLE Sensation: WNL RLE Coordination: decreased gross motor LLE Deficits / Details: MMT score of 3+ knee extension (limited by pain), can flex knee and hip in bed against gravity with minA; detects touch at feet LLE Sensation: WNL LLE Coordination: decreased gross motor    Cervical / Trunk Assessment Cervical / Trunk Assessment: Other exceptions Cervical / Trunk Exceptions: back pain  Communication   Communication: No difficulties  Cognition Arousal/Alertness: Lethargic Behavior During Therapy: Flat affect, Anxious Overall Cognitive Status: No family/caregiver present to determine baseline cognitive functioning                                 General Comments: Pt  lethargic, keeping eyes closed majority of session. However, answers questions appropriately and can follow cues, but self-limited due to pain. Appears to be [facility] cognitive status for tasks performed, but can assess further at a later date when pt is more alert.        General Comments General comments (skin integrity, edema, etc.): VSS on 2L O2 via [place]    Exercises     Assessment/Plan    PT Assessment Patient needs continued PT services  PT Problem List Decreased strength;Decreased activity tolerance;Decreased balance;Decreased mobility;Decreased coordination;Decreased knowledge of use of DME;Cardiopulmonary status limiting activity;Pain       PT Treatment Interventions DME instruction;Gait training;Stair training;Functional mobility training;Therapeutic activities;Therapeutic exercise;Balance training;Neuromuscular re-education;Patient/family education    PT Goals (Current goals can be found in the Care Plan section)  Acute Rehab PT Goals Patient Stated Goal: to not hurt PT Goal Formulation: With patient Time For Goal Achievement: 11/27/21 Potential to Achieve Goals: Good    Frequency Min 3X/week     Co-evaluation PT/OT/SLP Co-Evaluation/Treatment: Yes Reason for Co-Treatment: To address functional/ADL transfers;For patient/therapist safety;Other (comment) (pain limiting) PT goals addressed during session: Mobility/safety with mobility         AM-PAC PT "6 Clicks" Mobility  Outcome Measure Help needed turning from your back to your  side while in a flat bed without using bedrails?: A Lot Help needed moving from lying on your back to sitting on the side of a flat bed without using bedrails?: Total Help needed moving to and from a bed to a chair (including a wheelchair)?: Total Help needed standing up from a chair using your arms (e.g., wheelchair or bedside chair)?: Total Help needed to walk in hospital room?: Total Help needed climbing 3-5 steps with a railing? : Total 6  Click Score: 7    End of Session Equipment Utilized During Treatment: Other (comment);Oxygen (hoyer lift and pad) Activity Tolerance: Patient limited by pain;Patient limited by lethargy Patient left: in chair;with call bell/phone within reach;with chair alarm set Nurse Communication: Need for lift equipment;Mobility status PT Visit Diagnosis: Muscle weakness (generalized) (M62.81);History of falling (Z91.81);Difficulty in walking, not elsewhere classified (R26.2);Pain Pain - Right/Left: Left Pain - part of body: Arm;Knee (abdomen, back)    Time: 9381-8299 PT Time Calculation (min) (ACUTE ONLY): 23 min   Charges:   PT Evaluation $PT Eval Moderate Complexity: 1 Mod          Moishe Spice, PT, DPT Acute Rehabilitation Services  Pager: [phone] Office: [phone]   Orvan Falconer 11/13/2021, 10:54 AM

## 2021-11-13 NOTE — Progress Notes (Signed)
K+3.3 ?Replaced per protocol  ?

## 2021-11-13 NOTE — TOC Initial Note (Signed)
Transition of Care [facility]) - Initial/Assessment Note    Patient Details  Name: Darlene Griffith MRN: [id number] Date of Birth: 09-Aug-1951  Transition of Care [facility]) CM/SW Contact:    Bethena Roys, RN Phone Number: 11/13/2021, 11:28 AM  Clinical Narrative: Case Manager spoke with the patient and she stated she was independent from home alone. Patient has three daughters that check in on her and pick up medications for the patient. PT/OT will evaluate the patient. Case Manager will continue to follow the patient for additional transition of care needs.                 Expected Discharge Plan: [place] Barriers to Discharge: Continued Medical Work up   Expected Discharge Plan and Services Expected Discharge Plan: [place] In-house Referral: NA Discharge Planning Services: CM Consult Post Acute Care Choice: [place] arrangements for the past 2 months: Single Family Home                   DME Agency: NA     Prior Living Arrangements/Services Living arrangements for the past 2 months: Single Family Home Lives with:: Self Patient language and need for interpreter reviewed:: Yes        Need for Family Participation in Patient Care: Yes (Comment) Care giver support system in place?: Yes (comment)   Criminal Activity/Legal Involvement Pertinent to Current Situation/Hospitalization: No - Comment as needed  Activities of Daily Living Home Assistive Devices/Equipment: Walker (specify type) (Intermittently uses rollator after recieving steroid hip injections.) ADL Screening (condition at time of admission) Patient's cognitive ability adequate to safely complete daily activities?: Yes Is the patient deaf or have difficulty hearing?: No Does the patient have difficulty seeing, even when wearing glasses/contacts?: No Does the patient have difficulty concentrating, remembering, or making decisions?: No Patient able to express need  for assistance with ADLs?: Yes Does the patient have difficulty dressing or bathing?: No Independently performs ADLs?: Yes (appropriate for developmental age) Does the patient have difficulty walking or climbing stairs?: Yes Weakness of Legs: None Weakness of Arms/Hands: None   Emotional Assessment Appearance:: Appears stated age Attitude/Demeanor/Rapport: Engaged Affect (typically observed): Appropriate Orientation: : Oriented to Self, Oriented to Place Alcohol / Substance Use: Not Applicable Psych Involvement: No (comment)  Admission diagnosis:  Aortic dissection (HCC) [I71.00] Dissection of thoracoabdominal aorta ([place]) [I71.03] Patient Active Problem List   Diagnosis Date Noted   COPD (chronic obstructive pulmonary disease) ([place]) 11/08/2021   AKI (acute kidney injury) ([place])    Aortic dissection ([place]) 11/06/2021   Gastroesophageal reflux disease without esophagitis    Hyperthyroidism 04/19/2021   Snoring 03/02/2020   Tobacco use 03/02/2020   Genetic testing 02/10/2020   Family history of lung cancer    Personal history of colonic polyps    Dysphagia 01/06/2020   Cervical spondylosis with myelopathy and radiculopathy 07/05/2019   Neck pain, chronic 05/21/2019   Insomnia 07/27/2018   Multinodular goiter 07/14/2018   Chronic fatigue 06/15/2018   Midsternal chest pain 02/18/2015   Chest pain    Cough    Essential hypertension    Shortness of breath    Aortic stenosis    Pain in the chest    EKG abnormality    Malaise and fatigue    Lung nodule    Splenic cyst    Hepatic cyst    Hyperlipidemia    Hypertension 02/16/2015   PCP:  Martin[place], Betty G, MD Pharmacy:  [facility] ([address]), [place] [address] [place] [address] Phone: [phone] Fax: [place] [facility] [address] [place] [address] Phone: [phone] Fax:  [phone]   Readmission Risk Interventions No flowsheet data found.

## 2021-11-14 DIAGNOSIS — D62 Acute posthemorrhagic anemia: Secondary | ICD-10-CM | POA: Diagnosis not present

## 2021-11-14 DIAGNOSIS — R52 Pain, unspecified: Secondary | ICD-10-CM

## 2021-11-14 DIAGNOSIS — Z720 Tobacco use: Secondary | ICD-10-CM

## 2021-11-14 DIAGNOSIS — I7100 Dissection of unspecified site of aorta: Secondary | ICD-10-CM | POA: Diagnosis not present

## 2021-11-14 DIAGNOSIS — N179 Acute kidney failure, unspecified: Secondary | ICD-10-CM | POA: Diagnosis not present

## 2021-11-14 LAB — CBC
HCT: 25.9 % — ABNORMAL LOW (ref 36.0–46.0)
HCT: 25.6 % — ABNORMAL LOW (ref 36.0–46.0)
Hemoglobin: 8.3 g/dL — ABNORMAL LOW (ref 12.0–15.0)
Hemoglobin: 8.1 g/dL — ABNORMAL LOW (ref 12.0–15.0)
MCH: 31.3 pg (ref 26.0–34.0)
MCH: 30.3 pg (ref 26.0–34.0)
MCHC: 32 g/dL (ref 30.0–36.0)
MCHC: 31.6 g/dL (ref 30.0–36.0)
MCV: 97.7 fL (ref 80.0–100.0)
MCV: 95.9 fL (ref 80.0–100.0)
Platelets: 100 10*3/uL — ABNORMAL LOW (ref 150–400)
Platelets: 121 10*3/uL — ABNORMAL LOW (ref 150–400)
RBC: 2.65 MIL/uL — ABNORMAL LOW (ref 3.87–5.11)
RBC: 2.67 MIL/uL — ABNORMAL LOW (ref 3.87–5.11)
RDW: 14.6 % (ref 11.5–15.5)
RDW: 14.6 % (ref 11.5–15.5)
WBC: 11.4 10*3/uL — ABNORMAL HIGH (ref 4.0–10.5)
WBC: 12.3 10*3/uL — ABNORMAL HIGH (ref 4.0–10.5)
nRBC: 0 % (ref 0.0–0.2)
nRBC: 0 % (ref 0.0–0.2)

## 2021-11-14 LAB — BASIC METABOLIC PANEL
Anion gap: 10 (ref 5–15)
BUN: 6 mg/dL — ABNORMAL LOW (ref 8–23)
CO2: 26 mmol/L (ref 22–32)
Calcium: 8.8 mg/dL — ABNORMAL LOW (ref 8.9–10.3)
Chloride: 104 mmol/L (ref 98–111)
Creatinine, Ser: 0.43 mg/dL — ABNORMAL LOW (ref 0.44–1.00)
GFR, Estimated: >60 mL/min (ref >60)
Glucose, Bld: 71 mg/dL (ref 70–99)
Potassium: 3.8 mmol/L (ref 3.5–5.1)
Sodium: 140 mmol/L (ref 135–145)

## 2021-11-14 LAB — GLUCOSE, CAPILLARY
Glucose-Capillary: 89 mg/dL (ref 70–99)
Glucose-Capillary: 75 mg/dL (ref 70–99)
Glucose-Capillary: 111 mg/dL — ABNORMAL HIGH (ref 70–99)
Glucose-Capillary: 93 mg/dL (ref 70–99)
Glucose-Capillary: 83 mg/dL (ref 70–99)

## 2021-11-14 MED ORDER — METHYLNALTREXONE BROMIDE 12 MG/0.6ML ~~LOC~~ SOLN
12.0000 mg | Freq: Once | SUBCUTANEOUS | Status: AC
  Administered 2021-11-14: 12 mg via SUBCUTANEOUS
  Filled 2021-11-14: qty 0.6

## 2021-11-14 MED ORDER — PROPOFOL 10 MG/ML IV BOLUS
INTRAVENOUS | Status: AC
  Filled 2021-11-14: qty 40

## 2021-11-14 MED ORDER — PROTAMINE SULFATE 10 MG/ML IV SOLN
INTRAVENOUS | Status: AC
  Filled 2021-11-14: qty 5

## 2021-11-14 MED ORDER — CEFAZOLIN SODIUM 1 G IJ SOLR
INTRAMUSCULAR | Status: AC
  Filled 2021-11-14: qty 20

## 2021-11-14 MED ORDER — HEPARIN SODIUM (PORCINE) 1000 UNIT/ML IJ SOLN
INTRAMUSCULAR | Status: AC
  Filled 2021-11-14: qty 10

## 2021-11-14 MED ORDER — METOCLOPRAMIDE HCL 5 MG/ML IJ SOLN
5.0000 mg | Freq: Three times a day (TID) | INTRAMUSCULAR | Status: DC
  Administered 2021-11-14 – 2021-11-20 (×18): 5 mg via INTRAVENOUS
  Filled 2021-11-14 (×18): qty 2

## 2021-11-14 MED ORDER — VASOPRESSIN 20 UNIT/ML IV SOLN
INTRAVENOUS | Status: AC
  Filled 2021-11-14: qty 1

## 2021-11-14 MED ORDER — MIDAZOLAM HCL 2 MG/2ML IJ SOLN
INTRAMUSCULAR | Status: AC
  Filled 2021-11-14: qty 2

## 2021-11-14 MED ORDER — PROPOFOL 1000 MG/100ML IV EMUL
INTRAVENOUS | Status: AC
  Filled 2021-11-14: qty 100

## 2021-11-14 MED ORDER — FENTANYL CITRATE (PF) 250 MCG/5ML IJ SOLN
INTRAMUSCULAR | Status: AC
  Filled 2021-11-14: qty 5

## 2021-11-14 MED ORDER — ESMOLOL HCL 100 MG/10ML IV SOLN
INTRAVENOUS | Status: AC
  Filled 2021-11-14: qty 10

## 2021-11-14 MED ORDER — HEPARIN SODIUM (PORCINE) 5000 UNIT/ML IJ SOLN
5000.0000 [IU] | Freq: Three times a day (TID) | INTRAMUSCULAR | Status: DC
  Administered 2021-11-14 – 2021-11-21 (×21): 5000 [IU] via SUBCUTANEOUS
  Filled 2021-11-14 (×21): qty 1

## 2021-11-14 NOTE — Progress Notes (Addendum)
Aortic Surgery Progress Note    11/14/2021 6:42 AM 3 Days Post-Op  Subjective:  up in chair; seems in better spirits this morning.   Wants to know when the tube in her nose can come out.  She says she is passing gas but has not had a BM.  RN reports foley removed this morning.  Also reports she required some hydralazine for BP control this am.   Afebrile HR 70's-80's NSR 093'A-355'D systolic 322% 0UR4YH  Gtts:  none  Vitals:   11/14/21 0617 11/14/21 0623  BP: (!) 175/82 (!) 175/82  Pulse: 78 76  Resp: 14 12  Temp:    SpO2: 100% 100%    Physical Exam: Cardiac:  regular Lungs:  non labored Abdomen:  soft; +flatus; -BM Extremities:  palpable radial and DP pulses bilaterally; motor and sensory in tact General:  in better spirits this morning.  Looks good up in chair.  CBC    Component Value Date/Time   WBC 11.4 (H) 11/14/2021 0321   RBC 2.65 (L) 11/14/2021 0321   HGB 8.3 (L) 11/14/2021 0321   HCT 25.9 (L) 11/14/2021 0321   PLT 100 (L) 11/14/2021 0321   MCV 97.7 11/14/2021 0321   MCH 31.3 11/14/2021 0321   MCHC 32.0 11/14/2021 0321   RDW 14.6 11/14/2021 0321   LYMPHSABS 2.5 11/06/2021 0959   MONOABS 0.5 11/06/2021 0959   EOSABS 0.2 11/06/2021 0959   BASOSABS 0.0 11/06/2021 0959    BMET    Component Value Date/Time   NA 140 11/14/2021 0321   K 3.8 11/14/2021 0321   CL 104 11/14/2021 0321   CO2 26 11/14/2021 0321   GLUCOSE 71 11/14/2021 0321   BUN 6 (L) 11/14/2021 0321   CREATININE 0.43 (L) 11/14/2021 0321   CALCIUM 8.8 (L) 11/14/2021 0321   GFRNONAA >60 11/14/2021 0321   GFRAA >60 01/14/2020 1124    INR    Component Value Date/Time   INR 1.1 11/11/2021 2147     Intake/Output Summary (Last 24 hours) at 11/14/2021 0623 Last data filed at 11/14/2021 0600 Gross per 24 hour  Intake 2677.24 ml  Output 1790 ml  Net 887.24 ml    NGT output:  800cc/24hr JP drain output: 50cc/24hr (20cc last shift)   Assessment/Plan:  71 y.o. female is s/p   thoracic branch endovascular aortic repair complicated by iliac rupture. Rupture repaired by common iliac to common femoral bypass 3 Days Post-Op  -Vascular:  palpable radial and DP pulses bilaterally -Cardiac:  hemodynamically stable not requiring any gtts -Pulmonary:  O2 sat 98% on 2LO2NC; continue to work on IS and mobilizing -Neuro:  in tact -Renal:  renal function normal; foley removed this morning. -GI:  abdomen soft; NGT with 800cc/24hr.  She is passing flatus but no BM yet.  May benefit from suppository -Heme/ID:  acute blood loss anemia-hgb down this am-probably dilutional.  Pt asymptomatic.  Leukocytosis continues to improve.  Thrombocytopenia with platelet count of 100k and has been trending downward.  No evidence of bleeding-will continue to monitor.   Not receiving heparin.  Continue SCD's for DVT prophylaxis.   -General:  in better spirits this morning.  Continue mobilizing pt.  She in on PCA.   PT recommending CIR.     Leontine Locket, PA-C [facility] [phone] 11/14/2021 6:42 AM  VASCULAR STAFF ADDENDUM: I have independently interviewed and examined the patient. I agree with the above.  Doing well globally. Mobilizing slowly.  Transition to PRN pain control. Keep  NGT to suction. PT / OT / OOB / Ambulate.  [name]. Stanford Breed, MD [facility] Phone Number: [phone] 11/14/2021 2:30 PM

## 2021-11-14 NOTE — Progress Notes (Signed)
NAME:  Darlene Griffith, MRN:  [id number], DOB:  21-Feb-1951, LOS: 8 ADMISSION DATE:  11/06/2021, CONSULTATION DATE:  1/18 REFERRING MD:  Dr. Vanita Panda, CHIEF COMPLAINT:  Aortic dissection   History of Present Illness:  Patient is a 71 yo F w/ pertinent PMH of HTN, HLD, Hyperthyroidism, multinodular goiter present to [facility] ED on 1/18 with chest pain.  On 1/18, patient states she had acute onset chest pain  in upper back that radiates to her chest. EMS called. She states she has not been taking her home BP meds regularly. Arrived to [facility] ED. BP initially 136/76. Complaining of chest pain. EKG w/ sinus rhythm. Troponin and BNP WNL. CTA shows type B aortic dissection that extends into the infrarenal abdominal aorta and right common iliac artery. D-dimer 4.78. Started on Esmolol drip. Vascular surgery consulted. On 1/19 she was off all drips. Transferred to progressive and TRH on 1/20.  The evening of 1/20 the patient became increasingly hypertensive with some chest pain. Vascular surgery started the patient on cardine GTT. Transfer to ICU. PCCM consulted.    Pertinent  Medical History   Past Medical History:  Diagnosis Date   Chest pain    a. 01/2015 Echo: Nl LV fxn, Gr 1 DD, triv AI, mild MR.   Essential hypertension    Family history of lung cancer    Hepatic cyst    a. noted on CT 01/2015.   Hyperthyroidism    GOING TO [facility] FOR SECOND OPINION   Multinodular goiter    a. 01/2015 CT chest: multinodular goidter w/ substernal extension of the left lobe of the thyroid assoc w/ rightward deviation of tracheal air column.   Neck pain, chronic    Personal history of colonic polyps    Pulmonary nodules    a. 01/2015 CT Chest: RLL ~ 67mm subpleural nodule - rec f/u in 6-12 mos.   Splenic cyst    a. noted on CT 01/2015.   Significant Hospital Events: Including procedures, antibiotic start and stop dates in addition to other pertinent events   1/18: admitted to [facility]; aortic dissection; started on esmolol  drip  1/20: CTA chest  No change in descending AAA, HTN, started on cardiene GTT, TRH placed ICU transfer orders and PCCM consult 1/21 Still having CP, abdominal pain - no other acute issues overnight. Cardene weaned off 1/23 OR  1/24 extubated  Interim History / Subjective:  She continues to have pain, mostly in her lower back. Off cardene infusion.  Objective   Blood pressure (!) 164/63, pulse 87, temperature 97.8 F (36.6 C), temperature source Oral, resp. rate 18, height 5\' 1"  (1.549 m), weight 71.9 kg, SpO2 100 %.        Intake/Output Summary (Last 24 hours) at 11/14/2021 0815 Last data filed at 11/14/2021 0800 Gross per 24 hour  Intake 2967.45 ml  Output 1745 ml  Net 1222.45 ml    Filed Weights   11/12/21 0324 11/13/21 0500 11/14/21 0600  Weight: 70.8 kg 72.4 kg 71.9 kg    NG output 800cc UOP 940  Examination: General:  ill appearing woman sitting up in the chair in NAD HEENT: [place]/AT, eyes anicteric Neuro:  sleepy but arousable, falls back asleep quickly. Moving extremities but globally weak. Answering questions appropriately. CV: S1S2, RRR PULM:  breathing comfortably on [place], CTAB GI: mildly distended, soft, TTP only around incision in LLQ. Bowel sounds present today. Extremities: Minimal peripheral edema, no cyanosis Skin: Warm, dry, no rashes   WBC 11.4  H/H  8.3/25.9 Platelets 100 BUN 6 Cr 0.43   Resolved Hospital Problem list     Assessment & Plan:  Aortic Dissection: type B; extends into the infrarenal abdominal aorta and right common iliac artery s/p TEVAR 9/39 complicated by iliac artery disruption s/p left common iliac to common femoral artery bypass. HTN HX HLD Hx of tobacco abuse P: -goal SBP <160-- meeting with coreg and hydralazine. Can resume cardene if needed -daily statin -post-op care per vascular surgery -smoking cessation recommended  Ileus -reglan -reslistor to counteract opiates -OOB mobility -recommend keeping NGT LIWS until  output slows  Post-op vent management- extubated POD #1 on 1/24  -ETCO2 while PCA in use -wean supplemental O2 to maintain SpO2>90%  PEA cardiac arrest secondary to ABLA intra-operatively - ROSC after 66mins;  s/p 2835 ml PRBC, 307 ml PLT, 1185 ml FFP, 1 unit cryo, 3L crystalloid and 1245ml of albumin in OR  -con't supportive care measures -tele monitoring  Uncontrolled pain- back pain, some chest wall pain from compressions, some incisional -Con't dilaudid PCA. Eventually will need to transition to oral meds with IV breakthrough.  -scheduled oral tylenol -topical lidocaine to chest -can consider adding robaxin for back pain  AKI, resolved -strict I/Os -con't to monitor -renally dose meds, avoid nephrotoxic meds -ensure adequate renal perfusion -maintain adequate renal perfusion  Leukocytosis, reactive, improving -con't to monitor  Acute blood loss anemia, operative blood loss Thrombocytopenia, consumptive -con't to monitor -recheck CBC this afternoon with drop in H/H -no indication for transfusion currently.  Nausea, post-op -zofran & compazine PRN -NGT LIWS  Subclinical Hyperthyroidism Hx of Hyperthyroidism and multinodular goiter extending into mediastinum> biopsy in 2022 benign. -needs outpatient follow up    Hx of pulmonary nodules: 1/18 CT chest 5 mm nodule; same size as CT 2016 -no additional follow up needed for this nodule that has been stable for 6 years.  -recommend referral for outpatient lung cancer screening at discharge given ongoing tobacco abuse  Daughter updated at bedside during rounds.    Best Practice (right click and "Reselect all SmartList Selections" daily)   Diet/type: NPO DVT prophylaxis: SCD GI prophylaxis: PPI Lines: Central line and Arterial Line- R IJ CVC and R radial Aline Foley:  Yes, and it is still needed Code Status:  full code Last date of multidisciplinary goals of care discussion:     Critical Care Time:       Julian Hy, DO 11/14/21 8:15 AM  Pulmonary & Critical Care

## 2021-11-14 NOTE — Progress Notes (Signed)
Inpatient Rehab Admissions Coordinator:   Met with pt and her daughter at the bedside.  Pt very sleepy, but worked with therapy today and did extremely well.  She showed remarkable progress between yesterday's session and todays.  We discussed CIR recommendations and goals/expectations.  I think pt would reasonably reach supervision level with a short stay on CIR (~10-12 days or less).  I will need insurance approval and will start once pt transitions to oral pain meds and NGT removed.  Shann Medal, PT, DPT Admissions Coordinator [phone] 11/14/21  4:06 PM

## 2021-11-14 NOTE — Progress Notes (Signed)
Physical Therapy Treatment Patient Details Name: Darlene Griffith MRN: [id number] DOB: 03-20-1951 Today's Date: 11/14/2021   History of Present Illness Pt is a 71 y.o. female who presented 11/06/21 with acute onset CP. EKG w/ sinus rhythm. Troponin and BNP WNL. CTA shows type B aortic dissection that extends into the infrarenal abdominal aorta and right common iliac artery. S/p thoracic branch device TEVAR complicated by iliac rupture requiring emergent left common iliac - common femoral bypass 1/23. ETT 1/23 - 1/24. PMH: HTN, hepatic cyst, hyperthyroidism, multinodular goiter, pulmonary and colonic polyps, splenic cyst    PT Comments    Focused session on progressing pt with transfers and gait. Pt has made significant progress since yesterday as her pain improved. Pt was able to come to stand from the recliner with minAx2, needing extra time and assistance to extend her hips and trunk due to weakness and pain. Pt also requiring minAx2 with mod cues to navigate an eva-walker in the room until she fatigued. She is at high risk for falls, having difficulty standing upright and proximal to the eva-walker. Will continue to follow acutely. Current recommendations remain appropriate.    Recommendations for follow up therapy are one component of a multi-disciplinary discharge planning process, led by the attending physician.  Recommendations may be updated based on patient status, additional functional criteria and insurance authorization.  Follow Up Recommendations  Acute inpatient rehab (3hours/day)     Assistance Recommended at Discharge Frequent or constant Supervision/Assistance  Patient can return home with the following A lot of help with walking and/or transfers;A lot of help with bathing/dressing/bathroom;Assistance with cooking/housework;Assist for transportation;Help with stairs or ramp for entrance   Equipment Recommendations  Rolling walker (2 wheels)    Recommendations for Other Services        Precautions / Restrictions Precautions Precautions: Fall;Other (comment) Precaution Comments: NG tube, L JP drain Restrictions Weight Bearing Restrictions: No     Mobility  Bed Mobility               General bed mobility comments: Pt up in recliner upon arrival.    Transfers Overall transfer level: Needs assistance Equipment used:  Administrator, [employer]) Transfers: Sit to/from Stand Sit to Stand: Min assist, +2 physical assistance, +2 safety/equipment           General transfer comment: Cues provided to scoot hips to edge of seat, needing minAx2 to extend hips to power up to stand and place UEs on Eva-walker.    Ambulation/Gait Ambulation/Gait assistance: Min assist, +2 physical assistance, +2 safety/equipment Gait Distance (Feet): 20 Feet Assistive device: Ethelene Hal Gait Pattern/deviations: Step-through pattern, Decreased step length - right, Decreased step length - left, Decreased stride length, Trunk flexed, Shuffle Gait velocity: reduced Gait velocity interpretation: <1.31 ft/sec, indicative of household ambulator   General Gait Details: Pt with slow, short bil steps. Cues provided to step further proximal into eva-walker and extend trunk to stand more upright, momenatry success. Pt fatigued after walking to door, noted increased knee and hip flexion as she fatigued. MinA + 2 for safety, steadying, eva-walker and line management and mod cues throughout.   Stairs             Wheelchair Mobility    Modified Rankin (Stroke Patients Only)       Balance Overall balance assessment: Needs assistance         Standing balance support: Bilateral upper extremity supported, During functional activity, Reliant on assistive device for balance Standing balance-Leahy Scale: Poor Standing balance  comment: Reliant on bil UE support and external physical assistance.                            Cognition Arousal/Alertness: Awake/alert Behavior During  Therapy: Flat affect                                   General Comments: Pt more awake, still closing eyes once sitting often, but follows cues with extra time to process.        Exercises      General Comments General comments (skin integrity, edema, etc.): SpO2 >/= 91% on RA throughout      Pertinent Vitals/Pain Pain Assessment Pain Assessment: Faces Faces Pain Scale: Hurts little more Pain Location: L arm, back, abdomen Pain Descriptors / Indicators: Discomfort, Grimacing, Guarding Pain Intervention(s): Limited activity within patient's tolerance, Monitored during session, Repositioned, Other (comment) (had RN rub cream on her back)    Home Living                          Prior Function            PT Goals (current goals can now be found in the care plan section) Acute Rehab PT Goals Patient Stated Goal: to improve PT Goal Formulation: With patient/family Time For Goal Achievement: 11/27/21 Potential to Achieve Goals: Good Progress towards PT goals: Progressing toward goals    Frequency    Min 3X/week      PT Plan Equipment recommendations need to be updated    Co-evaluation              AM-PAC PT "6 Clicks" Mobility   Outcome Measure  Help needed turning from your back to your side while in a flat bed without using bedrails?: A Lot Help needed moving from lying on your back to sitting on the side of a flat bed without using bedrails?: A Lot Help needed moving to and from a bed to a chair (including a wheelchair)?: A Lot Help needed standing up from a chair using your arms (e.g., wheelchair or bedside chair)?: A Lot Help needed to walk in hospital room?: A Lot Help needed climbing 3-5 steps with a railing? : Total 6 Click Score: 11    End of Session Equipment Utilized During Treatment: Oxygen;Gait belt Activity Tolerance: Patient tolerated treatment well Patient left: in chair;with call bell/phone within reach;with chair  alarm set;with family/visitor present Nurse Communication: Mobility status;Other (comment) (sats) PT Visit Diagnosis: Muscle weakness (generalized) (M62.81);History of falling (Z91.81);Difficulty in walking, not elsewhere classified (R26.2);Pain;Unsteadiness on feet (R26.81);Other abnormalities of gait and mobility (R26.89) Pain - Right/Left: Left Pain - part of body: Arm (abdomen, back)     Time: 8341-9622 PT Time Calculation (min) (ACUTE ONLY): 26 min  Charges:  $Gait Training: 8-22 mins $Therapeutic Activity: 8-22 mins                     Moishe Spice, PT, DPT Acute Rehabilitation Services  Pager: [phone] Office: [place] 11/14/2021, 4:00 PM

## 2021-11-15 DIAGNOSIS — N179 Acute kidney failure, unspecified: Secondary | ICD-10-CM | POA: Diagnosis not present

## 2021-11-15 DIAGNOSIS — I7100 Dissection of unspecified site of aorta: Secondary | ICD-10-CM | POA: Diagnosis not present

## 2021-11-15 DIAGNOSIS — K567 Ileus, unspecified: Secondary | ICD-10-CM

## 2021-11-15 DIAGNOSIS — E0590 Thyrotoxicosis, unspecified without thyrotoxic crisis or storm: Secondary | ICD-10-CM

## 2021-11-15 DIAGNOSIS — I10 Essential (primary) hypertension: Secondary | ICD-10-CM | POA: Diagnosis not present

## 2021-11-15 LAB — BPAM RBC
Blood Product Expiration Date: 202302272359
Blood Product Expiration Date: 202302272359
Blood Product Expiration Date: 202302272359
Blood Product Expiration Date: 202302272359
Blood Product Expiration Date: 202302272359
Blood Product Expiration Date: 202302272359
Blood Product Expiration Date: 202303012359
Blood Product Expiration Date: 202303012359
Blood Product Expiration Date: 202303012359
Blood Product Expiration Date: 202303012359
Blood Product Expiration Date: 202303012359
Blood Product Expiration Date: 202303012359
Blood Product Expiration Date: 202303012359
Blood Product Expiration Date: 202303012359
Blood Product Expiration Date: 202303012359
Blood Product Expiration Date: 202303012359
ISSUE DATE / TIME: 202301231057
ISSUE DATE / TIME: 202301231057
ISSUE DATE / TIME: 202301231057
ISSUE DATE / TIME: 202301231057
ISSUE DATE / TIME: 202301231105
ISSUE DATE / TIME: 202301231105
ISSUE DATE / TIME: 202301231105
ISSUE DATE / TIME: 202301231105
ISSUE DATE / TIME: 202301231115
ISSUE DATE / TIME: 202301231115
ISSUE DATE / TIME: 202301231115
ISSUE DATE / TIME: 202301231115
ISSUE DATE / TIME: 202301231126
ISSUE DATE / TIME: 202301231126
ISSUE DATE / TIME: 202301231126
ISSUE DATE / TIME: 202301231126
Unit Type and Rh: 5100
Unit Type and Rh: 5100
Unit Type and Rh: 5100
Unit Type and Rh: 5100
Unit Type and Rh: 5100
Unit Type and Rh: 5100
Unit Type and Rh: 5100
Unit Type and Rh: 5100
Unit Type and Rh: 5100
Unit Type and Rh: 5100
Unit Type and Rh: 5100
Unit Type and Rh: 5100
Unit Type and Rh: 5100
Unit Type and Rh: 5100
Unit Type and Rh: 5100
Unit Type and Rh: 5100

## 2021-11-15 LAB — TYPE AND SCREEN
ABO/RH(D): O POS
Antibody Screen: NEGATIVE
Unit division: 0
Unit division: 0
Unit division: 0
Unit division: 0
Unit division: 0
Unit division: 0
Unit division: 0
Unit division: 0
Unit division: 0
Unit division: 0
Unit division: 0
Unit division: 0
Unit division: 0
Unit division: 0
Unit division: 0
Unit division: 0

## 2021-11-15 LAB — CBC
HCT: 29.3 % — ABNORMAL LOW (ref 36.0–46.0)
Hemoglobin: 9.2 g/dL — ABNORMAL LOW (ref 12.0–15.0)
MCH: 30.5 pg (ref 26.0–34.0)
MCHC: 31.4 g/dL (ref 30.0–36.0)
MCV: 97 fL (ref 80.0–100.0)
Platelets: 165 10*3/uL (ref 150–400)
RBC: 3.02 MIL/uL — ABNORMAL LOW (ref 3.87–5.11)
RDW: 14.6 % (ref 11.5–15.5)
WBC: 12.7 10*3/uL — ABNORMAL HIGH (ref 4.0–10.5)
nRBC: 0 % (ref 0.0–0.2)

## 2021-11-15 LAB — BASIC METABOLIC PANEL
Anion gap: 9 (ref 5–15)
BUN: 6 mg/dL — ABNORMAL LOW (ref 8–23)
CO2: 27 mmol/L (ref 22–32)
Calcium: 8.2 mg/dL — ABNORMAL LOW (ref 8.9–10.3)
Chloride: 101 mmol/L (ref 98–111)
Creatinine, Ser: 0.5 mg/dL (ref 0.44–1.00)
GFR, Estimated: >60 mL/min (ref >60)
Glucose, Bld: 87 mg/dL (ref 70–99)
Potassium: 3.1 mmol/L — ABNORMAL LOW (ref 3.5–5.1)
Sodium: 137 mmol/L (ref 135–145)

## 2021-11-15 LAB — GLUCOSE, CAPILLARY
Glucose-Capillary: 85 mg/dL (ref 70–99)
Glucose-Capillary: 87 mg/dL (ref 70–99)
Glucose-Capillary: 87 mg/dL (ref 70–99)
Glucose-Capillary: 87 mg/dL (ref 70–99)
Glucose-Capillary: 90 mg/dL (ref 70–99)
Glucose-Capillary: 93 mg/dL (ref 70–99)
Glucose-Capillary: 99 mg/dL (ref 70–99)

## 2021-11-15 MED ORDER — LABETALOL HCL 5 MG/ML IV SOLN
10.0000 mg | INTRAVENOUS | Status: DC | PRN
  Administered 2021-11-15 (×3): 20 mg via INTRAVENOUS
  Administered 2021-11-16: 10 mg via INTRAVENOUS
  Administered 2021-11-17: 20 mg via INTRAVENOUS
  Filled 2021-11-15 (×6): qty 4

## 2021-11-15 MED ORDER — LACTULOSE 10 GM/15ML PO SOLN
20.0000 g | Freq: Three times a day (TID) | ORAL | Status: DC
  Administered 2021-11-15: 20 g via ORAL
  Filled 2021-11-15: qty 30

## 2021-11-15 MED ORDER — AMLODIPINE BESYLATE 10 MG PO TABS
10.0000 mg | ORAL_TABLET | Freq: Every day | ORAL | Status: DC
  Administered 2021-11-16 – 2021-11-21 (×6): 10 mg via ORAL
  Filled 2021-11-15 (×6): qty 1

## 2021-11-15 MED ORDER — KETOROLAC TROMETHAMINE 15 MG/ML IJ SOLN
15.0000 mg | Freq: Once | INTRAMUSCULAR | Status: AC
  Administered 2021-11-15: 15 mg via INTRAVENOUS
  Filled 2021-11-15: qty 1

## 2021-11-15 MED ORDER — DOCUSATE SODIUM 100 MG PO CAPS
200.0000 mg | ORAL_CAPSULE | Freq: Two times a day (BID) | ORAL | Status: DC
  Administered 2021-11-15 – 2021-11-21 (×11): 200 mg via ORAL
  Filled 2021-11-15 (×12): qty 2

## 2021-11-15 MED ORDER — BISACODYL 10 MG RE SUPP
10.0000 mg | Freq: Once | RECTAL | Status: AC
  Administered 2021-11-15: 10 mg via RECTAL
  Filled 2021-11-15: qty 1

## 2021-11-15 MED ORDER — POTASSIUM CHLORIDE 20 MEQ PO PACK: 20.0000 meq | PACK | ORAL | Status: DC

## 2021-11-15 MED ORDER — AMLODIPINE BESYLATE 5 MG PO TABS
5.0000 mg | ORAL_TABLET | Freq: Every day | ORAL | Status: DC
  Administered 2021-11-15: 5 mg via ORAL
  Filled 2021-11-15: qty 1

## 2021-11-15 MED ORDER — POTASSIUM CHLORIDE 10 MEQ/50ML IV SOLN: 10.0000 meq | INTRAVENOUS | Status: DC

## 2021-11-15 MED ORDER — LOSARTAN POTASSIUM 25 MG PO TABS
25.0000 mg | ORAL_TABLET | Freq: Every day | ORAL | Status: DC
  Administered 2021-11-15 – 2021-11-21 (×7): 25 mg via ORAL
  Filled 2021-11-15 (×7): qty 1

## 2021-11-15 MED ORDER — POTASSIUM CHLORIDE 10 MEQ/50ML IV SOLN
10.0000 meq | INTRAVENOUS | Status: AC
  Administered 2021-11-15 (×6): 10 meq via INTRAVENOUS
  Filled 2021-11-15 (×5): qty 50

## 2021-11-15 MED ORDER — HYDRALAZINE HCL 20 MG/ML IJ SOLN
20.0000 mg | Freq: Four times a day (QID) | INTRAMUSCULAR | Status: DC | PRN
  Administered 2021-11-15: 20 mg via INTRAVENOUS
  Filled 2021-11-15 (×2): qty 1

## 2021-11-15 NOTE — Progress Notes (Signed)
Dr. Tacy Learn gave order for one time toradol 15 mg IV.

## 2021-11-15 NOTE — Progress Notes (Addendum)
[facility] [place] very tired.  She states she moved around more yesterday.      Assessment/Planning: POD # 4  71 y.o. female is s/p  thoracic branch endovascular aortic repair complicated by iliac rupture. Rupture repaired by common iliac to common femoral bypass  Patient has palpable pedal pulses, moving all 4 ext. Left LQ and groin are soft without skin compromise.  No evidence of active bleeding  HGB stable at 9.2 Hypokalemia 3.1 order placed to replenish WBC 12.7 on IV Cefepime  NG tube in place patient reports passing flatus Pain control is still an issue. Urine OP 550 cc last 24 hours.  Cr WNL     Objective (!) 161/77 89 97.9 F (36.6 C) (Oral) 14 100%  Intake/Output Summary (Last 24 hours) at 11/15/2021 0711 Last data filed at 11/15/2021 7628 Gross per 24 hour  Intake 3143.05 ml  Output 970 ml  Net 2173.05 ml    Patient has palpable pedal pulses, moving all 4 ext. Left LQ and groin are soft without skin compromise. Lungs non labored breathing A & O x 3 O2 support via [place]    Roxy Horseman 11/15/2021 7:11 AM --  Laboratory Lab Results: Recent Labs    11/14/21 1532 11/15/21 0431  WBC 12.3* 12.7*  HGB 8.1* 9.2*  HCT 25.6* 29.3*  PLT 121* 165   BMET Recent Labs    11/14/21 0321 11/15/21 0431  NA 140 137  K 3.8 3.1*  CL 104 101  CO2 26 27  GLUCOSE 71 87  BUN 6* 6*  CREATININE 0.43* 0.50  CALCIUM 8.8* 8.2*    COAG Lab Results  Component Value Date   INR 1.1 11/11/2021   INR 1.1 11/11/2021   INR 1.0 11/06/2021   No results found for: PTT   VASCULAR STAFF ADDENDUM: I have independently interviewed and examined the patient. I agree with the above.   Doing well overall. CNS: PRN pain control. PT / OT / OOB / Ambulate.  CV: Hypertensive. Keep SBP < 160. Will adjust oral regimen. Pulm: pulmonary hygiene: IS / OOB. GI: Removed NGT. OK for sips. Back off bowel regimen.  FEN/GU:  Monitor renal function / urine output - reassuring so far. Hopeful to start enteral nutrition this weekend.  Heme:  blood counts stable. no need for transfusion.  ID: Leukocytosis; CT angiogram showed some evidence of ? Pneumonia. Good initial response to Abx. PCCM has stopped Abx. Monitor WBC / fever curve. Endo: BG goal 120-180. Prophy: SQH. SCDs. pepcid.   Ready for stepdown.  [name]. Stanford Breed, MD [facility] Phone Number: [phone] 11/15/2021 2:32 PM

## 2021-11-15 NOTE — Progress Notes (Signed)
NAME:  Darlene Griffith, MRN:  [id number], DOB:  07/25/1951, LOS: 9 ADMISSION DATE:  11/06/2021, CONSULTATION DATE:  1/18 REFERRING MD:  Dr. Vanita Panda, CHIEF COMPLAINT:  Aortic dissection   History of Present Illness:  Patient is a 71 yo F w/ pertinent PMH of HTN, HLD, Hyperthyroidism, multinodular goiter present to [facility] ED on 1/18 with chest pain.  On 1/18, patient states she had acute onset chest pain  in upper back that radiates to her chest. EMS called. She states she has not been taking her home BP meds regularly. Arrived to [facility] ED. BP initially 136/76. Complaining of chest pain. EKG w/ sinus rhythm. Troponin and BNP WNL. CTA shows type B aortic dissection that extends into the infrarenal abdominal aorta and right common iliac artery. D-dimer 4.78. Started on Esmolol drip. Vascular surgery consulted. On 1/19 she was off all drips. Transferred to progressive and TRH on 1/20.  The evening of 1/20 the patient became increasingly hypertensive with some chest pain. Vascular surgery started the patient on cardine GTT. Transfer to ICU. PCCM consulted.    Pertinent  Medical History   Past Medical History:  Diagnosis Date   Chest pain    a. 01/2015 Echo: Nl LV fxn, Gr 1 DD, triv AI, mild MR.   Essential hypertension    Family history of lung cancer    Hepatic cyst    a. noted on CT 01/2015.   Hyperthyroidism    GOING TO [facility] FOR SECOND OPINION   Multinodular goiter    a. 01/2015 CT chest: multinodular goidter w/ substernal extension of the left lobe of the thyroid assoc w/ rightward deviation of tracheal air column.   Neck pain, chronic    Personal history of colonic polyps    Pulmonary nodules    a. 01/2015 CT Chest: RLL ~ 11mm subpleural nodule - rec f/u in 6-12 mos.   Splenic cyst    a. noted on CT 01/2015.   Significant Hospital Events: Including procedures, antibiotic start and stop dates in addition to other pertinent events   1/18: admitted to [facility]; aortic dissection; started on esmolol  drip  1/20: CTA chest  No change in descending AAA, HTN, started on cardiene GTT, TRH placed ICU transfer orders and PCCM consult 1/21 Still having CP, abdominal pain - no other acute issues overnight. Cardene weaned off 1/23 OR  1/24 extubated  Interim History / Subjective:  Patient is lethargic and sleepy today Continue to receive Dilaudid PCA No bowel movement despite multiple medications   Objective   Blood pressure (!) 157/79, pulse 81, temperature 97.7 F (36.5 C), temperature source Oral, resp. rate 12, height 5\' 1"  (1.549 m), weight 75.5 kg, SpO2 100 %.        Intake/Output Summary (Last 24 hours) at 11/15/2021 1140 Last data filed at 11/15/2021 1000 Gross per 24 hour  Intake 3126.72 ml  Output 520 ml  Net 2606.72 ml   Filed Weights   11/13/21 0500 11/14/21 0600 11/15/21 0700  Weight: 72.4 kg 71.9 kg 75.5 kg    Examination: General: Acute on chronically ill appearing elderly woman sitting up in the chair, lethargic HEENT: [place]/AT, eyes anicteric Neuro:  sleepy but arousable, falls back asleep quickly. Moving extremities but globally weak. Answering questions appropriately. CV: S1S2, RRR PULM:  breathing comfortably on [place], CTAB GI: mildly distended, soft, TTP only around incision in LLQ.  Very sluggish bowel sounds present Extremities: Minimal peripheral edema, no cyanosis Skin: Warm, dry, no rashes  Resolved Hospital Problem list     Assessment & Plan:  Aortic Dissection: type B; extends into the infrarenal abdominal aorta and right common iliac artery s/p TEVAR 3/71 complicated by iliac artery disruption s/p left common iliac to common femoral artery bypass. HTN HLD Tobacco dependence Vascular surgery is following Goal SBP <160, remain above goal in last 24 hours requiring IV labetalol push Off Cardene infusion Continue Coreg Resume home dose Norvasc and losartan Stop hydralazine Continue statin Counseling provided regarding smoking cessation  Acute  ileus Patient is still with no bowel movement She has very sluggish bowel sounds Continued to have some NG output She received Relistor to counteract opiate On Reglan, senna and MiraLAX We will give her Dulcolax suppository and started on lactulose OOB mobility Recommend keeping NGT LIWS until output slows  Post-op vent management- extubated on 1/24  Continue to wean oxygen to maintain SpO2>90%  PEA cardiac arrest secondary to ABLA intra-operatively ROSC after 78mins;  s/p 2835 ml PRBC, 307 ml PLT, 1185 ml FFP, 1 unit cryo, 3L crystalloid and 1262ml of albumin in OR  Continue supportive care and tele monitoring  Uncontrolled pain- back pain, some chest wall pain from compressions, some incisional She stated pain is slightly better today Dilaudid PCA was stopped Continue as needed Dilaudid and oxycodone Continue scheduled oral tylenol Apply topical lidocaine to chest  AKI, prerenal.  Resolved  Acute blood loss anemia, operative blood loss Thrombocytopenia, consumptive Hemoglobin and platelet counts are stable Monitor CBC No indication for transfusion currently.  Subclinical Hyperthyroidism Hx of Hyperthyroidism and multinodular goiter extending into mediastinum> biopsy in 2022 benign. -needs outpatient follow up    Hx of pulmonary nodules: 1/18 CT chest 5 mm nodule; same size as CT 2016 -no additional follow up needed for this nodule that has been stable for 6 years.  -recommend referral for outpatient lung cancer screening at discharge given ongoing tobacco abuse   Best Practice (right click and "Reselect all SmartList Selections" daily)   Diet/type: NPO DVT prophylaxis: SCD GI prophylaxis: PPI Lines: Central line and Arterial Line- R IJ CVC and R radial Aline Foley:  Yes, and it is still needed Code Status:  full code Last date of multidisciplinary goals of care discussion: 1/27: Patient's daughter was updated at bedside, decision was to continue full aggressive  care    Critical Care Time:     Total critical care time: 31 minutes  Performed by: Jacky Kindle   Critical care time was exclusive of separately billable procedures and treating other patients.   Critical care was necessary to treat or prevent imminent or life-threatening deterioration.   Critical care was time spent personally by me on the following activities: development of treatment plan with patient and/or surrogate as well as nursing, discussions with consultants, evaluation of patient's response to treatment, examination of patient, obtaining history from patient or surrogate, ordering and performing treatments and interventions, ordering and review of laboratory studies, ordering and review of radiographic studies, pulse oximetry and re-evaluation of patient's condition.   Jacky Kindle MD [facility] See Amion for pager If no response to pager, please call [phone] until 7pm After 7pm, Please call E-link [phone]

## 2021-11-15 NOTE — Progress Notes (Signed)
eLink Physician-Brief Progress Note Patient Name: Darlene Griffith DOB: Oct 02, 1951 MRN: [id number]   Date of Service  11/15/2021  HPI/Events of Note  Patient with sub-optimal blood pressure control, BP 198/78, MAP 108, HR 90's.  eICU Interventions  PRN iv Labetalol ordered, failing which patient will be placed back on a Cleviprex gtt.     Intervention Category Major Interventions: Hypertension - evaluation and management  Frederik Pear 11/15/2021, 12:33 AM

## 2021-11-15 NOTE — Progress Notes (Signed)
K+ 3.1 Replaced per protocol

## 2021-11-15 NOTE — Progress Notes (Addendum)
Dr. Stanford Breed present and gave order that patient may have very small amounts of sips of clear liquid including coke, coffee and juice. MD also gave order to keep patient's systolic blood pressure less than 160.

## 2021-11-15 NOTE — Progress Notes (Signed)
Inpatient Rehab Admissions Coordinator:   Note requiring cleviprex infusion this AM.  Will continue to monitor for progress and open insurance when closer to being ready for CIR.    Shann Medal, PT, DPT Admissions Coordinator [phone] 11/15/21  8:41 AM

## 2021-11-15 NOTE — Progress Notes (Signed)
Patient moved to room 11 on 2C by bed, alert with no distress noted on tele. Hildred Alamin, RN at bedside and patient's granddaughter.

## 2021-11-15 NOTE — Progress Notes (Signed)
Report called to Wynelle Link, RN on [place].

## 2021-11-15 NOTE — Progress Notes (Signed)
Dilaudid PCA 27 ml wasted with Wynona Canes, RN.

## 2021-11-16 ENCOUNTER — Inpatient Hospital Stay (HOSPITAL_COMMUNITY): Payer: BC Managed Care – PPO

## 2021-11-16 DIAGNOSIS — M7989 Other specified soft tissue disorders: Secondary | ICD-10-CM

## 2021-11-16 LAB — GLUCOSE, CAPILLARY
Glucose-Capillary: 87 mg/dL (ref 70–99)
Glucose-Capillary: 100 mg/dL — ABNORMAL HIGH (ref 70–99)
Glucose-Capillary: 121 mg/dL — ABNORMAL HIGH (ref 70–99)
Glucose-Capillary: 95 mg/dL (ref 70–99)
Glucose-Capillary: 109 mg/dL — ABNORMAL HIGH (ref 70–99)

## 2021-11-16 LAB — CBC
HCT: 28.5 % — ABNORMAL LOW (ref 36.0–46.0)
Hemoglobin: 9.3 g/dL — ABNORMAL LOW (ref 12.0–15.0)
MCH: 30.7 pg (ref 26.0–34.0)
MCHC: 32.6 g/dL (ref 30.0–36.0)
MCV: 94.1 fL (ref 80.0–100.0)
Platelets: 207 10*3/uL (ref 150–400)
RBC: 3.03 MIL/uL — ABNORMAL LOW (ref 3.87–5.11)
RDW: 14.8 % (ref 11.5–15.5)
WBC: 10.6 10*3/uL — ABNORMAL HIGH (ref 4.0–10.5)
nRBC: 0 % (ref 0.0–0.2)

## 2021-11-16 LAB — BASIC METABOLIC PANEL
Anion gap: 10 (ref 5–15)
BUN: 6 mg/dL — ABNORMAL LOW (ref 8–23)
CO2: 27 mmol/L (ref 22–32)
Calcium: 8.3 mg/dL — ABNORMAL LOW (ref 8.9–10.3)
Chloride: 100 mmol/L (ref 98–111)
Creatinine, Ser: 0.52 mg/dL (ref 0.44–1.00)
GFR, Estimated: >60 mL/min (ref >60)
Glucose, Bld: 90 mg/dL (ref 70–99)
Potassium: 3.4 mmol/L — ABNORMAL LOW (ref 3.5–5.1)
Sodium: 137 mmol/L (ref 135–145)

## 2021-11-16 LAB — MAGNESIUM: Magnesium: 1.8 mg/dL (ref 1.7–2.4)

## 2021-11-16 MED ORDER — PANTOPRAZOLE SODIUM 40 MG PO TBEC
40.0000 mg | DELAYED_RELEASE_TABLET | Freq: Every day | ORAL | Status: DC
  Administered 2021-11-16 – 2021-11-20 (×5): 40 mg via ORAL
  Filled 2021-11-16 (×5): qty 1

## 2021-11-16 NOTE — Plan of Care (Signed)
  Problem: Coping: Goal: Level of anxiety will decrease Outcome: Progressing   Problem: Elimination: Goal: Will not experience complications related to bowel motility Outcome: Progressing Goal: Will not experience complications related to urinary retention Outcome: Progressing   Problem: Safety: Goal: Ability to remain free from injury will improve Outcome: Progressing   

## 2021-11-16 NOTE — Progress Notes (Signed)
°  Progress Note    11/16/2021 9:02 AM 5 Days Post-Op  Subjective: Still complaining of pain mostly in her back and left groin area.  States that she is passing flatus and had a bowel movement with NG tube out  Vitals:   11/16/21 0300 11/16/21 0800  BP: (!) 169/81 (!) 217/91  Pulse: 90 90  Resp: 17 14  Temp: 98.5 F (36.9 C) 98.5 F (36.9 C)  SpO2: 100% 100%    Physical Exam: Awake alert oriented Nonlabored respirations Abdomen is soft left lower quadrant incision with staples Right upper extremity swelling including the hand with PICC line in place Left groin incision clean dry intact with staples Both feet are warm and well perfused with palpable pedal pulses  CBC    Component Value Date/Time   WBC 10.6 (H) 11/16/2021 0404   RBC 3.03 (L) 11/16/2021 0404   HGB 9.3 (L) 11/16/2021 0404   HCT 28.5 (L) 11/16/2021 0404   PLT 207 11/16/2021 0404   MCV 94.1 11/16/2021 0404   MCH 30.7 11/16/2021 0404   MCHC 32.6 11/16/2021 0404   RDW 14.8 11/16/2021 0404   LYMPHSABS 2.5 11/06/2021 0959   MONOABS 0.5 11/06/2021 0959   EOSABS 0.2 11/06/2021 0959   BASOSABS 0.0 11/06/2021 0959    BMET    Component Value Date/Time   NA 137 11/16/2021 0404   K 3.4 (L) 11/16/2021 0404   CL 100 11/16/2021 0404   CO2 27 11/16/2021 0404   GLUCOSE 90 11/16/2021 0404   BUN 6 (L) 11/16/2021 0404   CREATININE 0.52 11/16/2021 0404   CALCIUM 8.3 (L) 11/16/2021 0404   GFRNONAA >60 11/16/2021 0404   GFRAA >60 01/14/2020 1124    INR    Component Value Date/Time   INR 1.1 11/11/2021 2147     Intake/Output Summary (Last 24 hours) at 11/16/2021 0902 Last data filed at 11/16/2021 0800 Gross per 24 hour  Intake 408.3 ml  Output 1160 ml  Net -751.7 ml     Assessment:  71 y.o. female is s/p endovascular aneurysm repair type B aortic dissection with persistent pain, NG tube removed  Plan: Will allow heart healthy diet patient does not have her teeth will mostly need to rely on soft  food  Leukocytosis resolving no evidence of infection this morning and antibiotics have been discontinued initially started for pneumonia noted on postoperative CT  She does have right upper extremity swelling we will check venous duplex  I reviewed her postoperative CT possibly she still has pain from the dissection although with good perfusion distally this is less likely.  She is getting intermittent IV Dilaudid which is helping some she also has Toradol and p.o. oxycodone ordered and I have encouraged the oral medication route  Subcutaneous heparin if there is DVT noted on upper extremity duplex we will transition to full dose anticoagulation    Yolander Goodie C. Donzetta Matters, MD [facility] Office: [phone] Pager: [phone]  11/16/2021 9:02 AM

## 2021-11-16 NOTE — Progress Notes (Signed)
Upper extremity venous has been completed.   Preliminary results in CV Proc.   [name] 11/16/2021 11:56 AM

## 2021-11-17 LAB — CBC WITH DIFFERENTIAL/PLATELET
Abs Immature Granulocytes: 0.07 10*3/uL (ref 0.00–0.07)
Basophils Absolute: 0 10*3/uL (ref 0.0–0.1)
Basophils Relative: 0 %
Eosinophils Absolute: 0.1 10*3/uL (ref 0.0–0.5)
Eosinophils Relative: 1 %
HCT: 28.2 % — ABNORMAL LOW (ref 36.0–46.0)
Hemoglobin: 9.3 g/dL — ABNORMAL LOW (ref 12.0–15.0)
Immature Granulocytes: 1 %
Lymphocytes Relative: 15 %
Lymphs Abs: 1.6 10*3/uL (ref 0.7–4.0)
MCH: 30.9 pg (ref 26.0–34.0)
MCHC: 33 g/dL (ref 30.0–36.0)
MCV: 93.7 fL (ref 80.0–100.0)
Monocytes Absolute: 1.2 10*3/uL — ABNORMAL HIGH (ref 0.1–1.0)
Monocytes Relative: 11 %
Neutro Abs: 8.2 10*3/uL — ABNORMAL HIGH (ref 1.7–7.7)
Neutrophils Relative %: 72 %
Platelets: 229 10*3/uL (ref 150–400)
RBC: 3.01 MIL/uL — ABNORMAL LOW (ref 3.87–5.11)
RDW: 14.5 % (ref 11.5–15.5)
WBC: 11.2 10*3/uL — ABNORMAL HIGH (ref 4.0–10.5)
nRBC: 0 % (ref 0.0–0.2)

## 2021-11-17 LAB — GLUCOSE, CAPILLARY
Glucose-Capillary: 105 mg/dL — ABNORMAL HIGH (ref 70–99)
Glucose-Capillary: 114 mg/dL — ABNORMAL HIGH (ref 70–99)
Glucose-Capillary: 90 mg/dL (ref 70–99)
Glucose-Capillary: 95 mg/dL (ref 70–99)
Glucose-Capillary: 99 mg/dL (ref 70–99)

## 2021-11-17 LAB — BASIC METABOLIC PANEL
Anion gap: 10 (ref 5–15)
BUN: 7 mg/dL — ABNORMAL LOW (ref 8–23)
CO2: 26 mmol/L (ref 22–32)
Calcium: 8.2 mg/dL — ABNORMAL LOW (ref 8.9–10.3)
Chloride: 99 mmol/L (ref 98–111)
Creatinine, Ser: 0.43 mg/dL — ABNORMAL LOW (ref 0.44–1.00)
GFR, Estimated: >60 mL/min (ref >60)
Glucose, Bld: 102 mg/dL — ABNORMAL HIGH (ref 70–99)
Potassium: 3.4 mmol/L — ABNORMAL LOW (ref 3.5–5.1)
Sodium: 135 mmol/L (ref 135–145)

## 2021-11-17 MED ORDER — KETOROLAC TROMETHAMINE 30 MG/ML IJ SOLN
30.0000 mg | Freq: Four times a day (QID) | INTRAMUSCULAR | Status: AC
  Administered 2021-11-17 – 2021-11-18 (×5): 30 mg via INTRAVENOUS
  Filled 2021-11-17 (×5): qty 1

## 2021-11-17 NOTE — Plan of Care (Signed)
°  Problem: Education: Goal: Knowledge of General Education information will improve Description: Including pain rating scale, medication(s)/side effects and non-pharmacologic comfort measures Outcome: Progressing   Problem: Health Behavior/Discharge Planning: Goal: Ability to manage health-related needs will improve Outcome: Progressing   Problem: Clinical Measurements: Goal: Ability to maintain clinical measurements within normal limits will improve Outcome: Progressing Goal: Will remain free from infection Outcome: Progressing Goal: Diagnostic test results will improve Outcome: Progressing Goal: Respiratory complications will improve Outcome: Progressing Goal: Cardiovascular complication will be avoided Outcome: Progressing   Problem: Activity: Goal: Risk for activity intolerance will decrease Outcome: Progressing   Problem: Nutrition: Goal: Adequate nutrition will be maintained Outcome: Progressing   Problem: Coping: Goal: Level of anxiety will decrease Outcome: Progressing   Problem: Elimination: Goal: Will not experience complications related to bowel motility Outcome: Progressing Goal: Will not experience complications related to urinary retention Outcome: Progressing   Problem: Pain Managment: Goal: General experience of comfort will improve Outcome: Progressing   Problem: Safety: Goal: Ability to remain free from injury will improve Outcome: Progressing   Problem: Skin Integrity: Goal: Risk for impaired skin integrity will decrease Outcome: Progressing   Problem: Clinical Measurements: Goal: Will remain free from infection Outcome: Progressing   Problem: Pain Managment: Goal: General experience of comfort will improve Outcome: Progressing

## 2021-11-17 NOTE — Progress Notes (Signed)
°  Progress Note    11/17/2021 9:47 AM 6 Days Post-Op  Subjective: Complains of pain in her back and abdomen.  She tolerated minimal diet yesterday  Vitals:   11/17/21 0800 11/17/21 0849  BP: (!) 169/88   Pulse: 79   Resp: 18   Temp: 99 F (37.2 C)   SpO2: 94% 95%    Physical Exam: Awake alert oriented Unlabored respirations Abdomen is soft left lower quadrant incision clean dry intact with staples Both feet are warm and well perfused with palpable dorsalis pedis pulses Persistent edema right upper extremity unchanged from yesterday  CBC    Component Value Date/Time   WBC 10.6 (H) 11/16/2021 0404   RBC 3.03 (L) 11/16/2021 0404   HGB 9.3 (L) 11/16/2021 0404   HCT 28.5 (L) 11/16/2021 0404   PLT 207 11/16/2021 0404   MCV 94.1 11/16/2021 0404   MCH 30.7 11/16/2021 0404   MCHC 32.6 11/16/2021 0404   RDW 14.8 11/16/2021 0404   LYMPHSABS 2.5 11/06/2021 0959   MONOABS 0.5 11/06/2021 0959   EOSABS 0.2 11/06/2021 0959   BASOSABS 0.0 11/06/2021 0959    BMET    Component Value Date/Time   NA 137 11/16/2021 0404   K 3.4 (L) 11/16/2021 0404   CL 100 11/16/2021 0404   CO2 27 11/16/2021 0404   GLUCOSE 90 11/16/2021 0404   BUN 6 (L) 11/16/2021 0404   CREATININE 0.52 11/16/2021 0404   CALCIUM 8.3 (L) 11/16/2021 0404   GFRNONAA >60 11/16/2021 0404   GFRAA >60 01/14/2020 1124    INR    Component Value Date/Time   INR 1.1 11/11/2021 2147     Intake/Output Summary (Last 24 hours) at 11/17/2021 0947 Last data filed at 11/16/2021 1719 Gross per 24 hour  Intake 480 ml  Output 1100 ml  Net -620 ml     Assessment:  71 y.o. female is s/p endovascular aneurysm repair type B aortic dissection.  She is now tolerating diet has persistent pain.  No evidence of DVT right upper extremity with PICC line in place  Plan: Recheck BMP and CBC  Out of bed as tolerated  I have reordered Toradol for pain control  Continue subcutaneous heparin DVT prophylaxis  Maclovio Henson C. Donzetta Matters,  MD [facility] Office: [phone] Pager: [phone]  11/17/2021 9:47 AM

## 2021-11-18 LAB — GLUCOSE, CAPILLARY
Glucose-Capillary: 114 mg/dL — ABNORMAL HIGH (ref 70–99)
Glucose-Capillary: 118 mg/dL — ABNORMAL HIGH (ref 70–99)
Glucose-Capillary: 150 mg/dL — ABNORMAL HIGH (ref 70–99)
Glucose-Capillary: 86 mg/dL (ref 70–99)
Glucose-Capillary: 93 mg/dL (ref 70–99)
Glucose-Capillary: 95 mg/dL (ref 70–99)
Glucose-Capillary: 96 mg/dL (ref 70–99)

## 2021-11-18 MED ORDER — SENNOSIDES-DOCUSATE SODIUM 8.6-50 MG PO TABS
1.0000 | ORAL_TABLET | Freq: Every day | ORAL | Status: DC | PRN
  Administered 2021-11-18 – 2021-11-19 (×2): 1 via ORAL
  Filled 2021-11-18 (×2): qty 1

## 2021-11-18 NOTE — Progress Notes (Signed)
Mobility Specialist Progress Note    11/18/21 1619  Mobility  Activity Contraindicated/medical hold   RN advised to hold off d/t pt pain. Will f/u as schedule permits.   [facility] Mobility Specialist  M.S. 2C and 6E: [phone] M.S. 4E: [phone]

## 2021-11-18 NOTE — Progress Notes (Signed)
Occupational Therapy Treatment Patient Details Name: Darlene Griffith MRN: [id number] DOB: 1950-11-08 Today's Date: 11/18/2021   History of present illness Pt is a 71 y.o. female who presented 11/06/21 with acute onset CP. EKG w/ sinus rhythm. Troponin and BNP WNL. CTA shows type B aortic dissection that extends into the infrarenal abdominal aorta and right common iliac artery. S/p thoracic branch device TEVAR complicated by iliac rupture requiring emergent left common iliac - common femoral bypass 1/23. ETT 1/23 - 1/24. PMH: HTN, hepatic cyst, hyperthyroidism, multinodular goiter, pulmonary and colonic polyps, splenic cyst   OT comments  Pt progressed with bed mobility and basic transfer this session. Pt requires cues for sternal precautions and use of pillow for positioning. Pt needs cues to bed mobility. Pt states after "that was the easiest ive ever done that." Recommendation for CIR. At this time.    Recommendations for follow up therapy are one component of a multi-disciplinary discharge planning process, led by the attending physician.  Recommendations may be updated based on patient status, additional functional criteria and insurance authorization.    Follow Up Recommendations  Acute inpatient rehab (3hours/day)    Assistance Recommended at Discharge None  Patient can return home with the following  A lot of help with walking and/or transfers;A lot of help with bathing/dressing/bathroom;Assist for transportation   Equipment Recommendations  BSC/3in1    Recommendations for Other Services Rehab consult    Precautions / Restrictions Precautions Precautions: Fall;Other (comment) Precaution Comments: Keep SBP <160       Mobility Bed Mobility Overal bed mobility: Needs Assistance Bed Mobility: Rolling, Supine to Sit, Sit to Supine Rolling: Supervision   Supine to sit: Min assist Sit to supine: Min assist        Transfers Overall transfer level: Needs assistance Equipment  used: 1 person hand held assist Transfers: Sit to/from Stand Sit to Stand: Min guard                 Balance Overall balance assessment: Needs assistance Sitting-balance support: Bilateral upper extremity supported, Feet supported Sitting balance-Leahy Scale: Fair     Standing balance support: Single extremity supported, During functional activity Standing balance-Leahy Scale: Fair                             ADL either performed or assessed with clinical judgement   ADL Overall ADL's : Needs assistance/impaired Eating/Feeding: Set up   Grooming: Applying deodorant   Upper Body Bathing: Minimal assistance   Lower Body Bathing: Moderate assistance   Upper Body Dressing : Minimal assistance   Lower Body Dressing: Moderate assistance   Toilet Transfer: Minimal assistance                  Extremity/Trunk Assessment              Vision       Perception     Praxis      Cognition Arousal/Alertness: Awake/alert Behavior During Therapy: WFL for tasks assessed/performed Overall Cognitive Status: Impaired/Different from baseline Area of Impairment: Awareness                           Awareness: Emergent            Exercises Other Exercises Other Exercises: cues for use of pillow for safety    Shoulder Instructions       General Comments daughter present during session  Pertinent Vitals/ Pain          Home Living                                          Prior Functioning/Environment              Frequency  Min 2X/week        Progress Toward Goals  OT Goals(current goals can now be found in the care plan section)  Progress towards OT goals: Progressing toward goals  Acute Rehab OT Goals Patient Stated Goal: to rest of a little while OT Goal Formulation: With patient Time For Goal Achievement: 11/27/21 Potential to Achieve Goals: Good ADL Goals Pt Will Perform Grooming:  sitting;with supervision Pt Will Perform Upper Body Bathing: sitting;with supervision Additional ADL Goal #1: pt will complete bed mobility Supervision as precursor to adls. Additional ADL Goal #2: pt will tolerate static sitting mod i(A) as precursor to adls.  Plan Discharge plan remains appropriate    Co-evaluation                 AM-PAC OT "6 Clicks" Daily Activity     Outcome Measure   Help from another person eating meals?: A Little Help from another person taking care of personal grooming?: A Little Help from another person toileting, which includes using toliet, bedpan, or urinal?: A Lot Help from another person bathing (including washing, rinsing, drying)?: A Lot Help from another person to put on and taking off regular upper body clothing?: A Lot Help from another person to put on and taking off regular lower body clothing?: A Lot 6 Click Score: 14    End of Session Equipment Utilized During Treatment: Oxygen  OT Visit Diagnosis: Unsteadiness on feet (R26.81)   Activity Tolerance Patient tolerated treatment well   Patient Left with call bell/phone within reach;in bed;with family/visitor present;with bed alarm set   Nurse Communication Mobility status;Precautions;Need for lift equipment        Time: 1215-1237 OT Time Calculation (min): 22 min  Charges: OT General Charges $OT Visit: 1 Visit OT Treatments $Self Care/Home Management : 8-22 mins   Darlene Griffith, OTR/L  Acute Rehabilitation Services Pager: [phone] Office: [phone] .   Jeri Modena 11/18/2021, 3:34 PM

## 2021-11-18 NOTE — Progress Notes (Signed)
Inpatient Rehabilitation Admissions Coordinator   I met with patient and her daughter at bedside. She is progressing with mobility. I have asked OT to see her today and then I will begin Auth with [employer] for possible CIR admit this week. They are in agreement.  Danne Baxter, RN, MSN Rehab Admissions Coordinator [phone] 11/18/2021 12:08 PM

## 2021-11-18 NOTE — Progress Notes (Signed)
Physical Therapy Treatment Patient Details Name: Darlene Griffith MRN: [id number] DOB: November 12, 1950 Today's Date: 11/18/2021   History of Present Illness Pt is a 71 y.o. female who presented 11/06/21 with acute onset CP. EKG w/ sinus rhythm. Troponin and BNP WNL. CTA shows type B aortic dissection that extends into the infrarenal abdominal aorta and right common iliac artery. S/p thoracic branch device TEVAR complicated by iliac rupture requiring emergent left common iliac - common femoral bypass 1/23. ETT 1/23 - 1/24. PMH: HTN, hepatic cyst, hyperthyroidism, multinodular goiter, pulmonary and colonic polyps, splenic cyst    PT Comments    Patient progressing well towards PT goals. Session focused on functional transfers and gait training with use of RW and chair follow for support. Pt continues to report pain in back but motivated to move with some encouragement. Min A needed for stability/safety as well as cues for upright as pt wanting to lean on RW handles. Demonstrates weakness, decreased endurance and slow gait speed putting pt at increased risk for falls. VSS on 2L/min 02 [place]. Will follow.    Recommendations for follow up therapy are one component of a multi-disciplinary discharge planning process, led by the attending physician.  Recommendations may be updated based on patient status, additional functional criteria and insurance authorization.  Follow Up Recommendations  Acute inpatient rehab (3hours/day)     Assistance Recommended at Discharge Frequent or constant Supervision/Assistance  Patient can return home with the following A little help with walking and/or transfers;A little help with bathing/dressing/bathroom;Help with stairs or ramp for entrance;Assist for transportation;Assistance with cooking/housework   Equipment Recommendations  Rolling walker (2 wheels)    Recommendations for Other Services       Precautions / Restrictions Precautions Precautions: Fall;Other  (comment) Precaution Comments: Keep SBP <160 Restrictions Weight Bearing Restrictions: No     Mobility  Bed Mobility Overal bed mobility: Needs Assistance Bed Mobility: Supine to Sit     Supine to sit: Min guard, HOB elevated     General bed mobility comments: Able to get to EOB without assist, increased time.    Transfers Overall transfer level: Needs assistance Equipment used: Rolling walker (2 wheels) Transfers: Sit to/from Stand, Bed to chair/wheelchair/BSC Sit to Stand: Min guard   Step pivot transfers: Min assist       General transfer comment: Min guard for safety to stand from EOB, MIn A to step pivot to get to [facility], flexed trunk. Stood from chair X1.    Ambulation/Gait Ambulation/Gait assistance: Min assist, Min guard, +2 safety/equipment Gait Distance (Feet): 45 Feet Assistive device: Rolling walker (2 wheels) Gait Pattern/deviations: Step-through pattern, Decreased stride length, Decreased step length - left, Decreased step length - right, Trunk flexed Gait velocity: very slow     General Gait Details: Slow, mildly unsteady gait with decreased step lengths bilaterally and constant cues to use hands on RW and not lean on forearms. VSS on RA.   Stairs             Wheelchair Mobility    Modified Rankin (Stroke Patients Only)       Balance Overall balance assessment: Needs assistance Sitting-balance support: Feet supported, No upper extremity supported Sitting balance-Leahy Scale: Fair     Standing balance support: During functional activity, Reliant on assistive device for balance Standing balance-Leahy Scale: Fair Standing balance comment: Min A for transfer and UE support for walking.  Cognition Arousal/Alertness: Awake/alert Behavior During Therapy: WFL for tasks assessed/performed Overall Cognitive Status: No family/caregiver present to determine baseline cognitive functioning                                  General Comments: Pt in good spirits today; laughing appropriately with this therapist. Awake and alert. Needs some encouragement.        Exercises      General Comments General comments (skin integrity, edema, etc.): Darlene Griffith presrnt during session. VSS on 2L/min 02 [place].      Pertinent Vitals/Pain Pain Assessment Pain Assessment: Faces Faces Pain Scale: Hurts even more Pain Location: back Pain Descriptors / Indicators: Discomfort, Grimacing, Guarding Pain Intervention(s): Monitored during session, Patient requesting pain meds-RN notified, Repositioned, Limited activity within patient's tolerance    Home Living                          Prior Function            PT Goals (current goals can now be found in the care plan section) Progress towards PT goals: Progressing toward goals    Frequency    Min 3X/week      PT Plan Current plan remains appropriate    Co-evaluation              AM-PAC PT "6 Clicks" Mobility   Outcome Measure  Help needed turning from your back to your side while in a flat bed without using bedrails?: A Little Help needed moving from lying on your back to sitting on the side of a flat bed without using bedrails?: A Little Help needed moving to and from a bed to a chair (including a wheelchair)?: A Little Help needed standing up from a chair using your arms (e.g., wheelchair or bedside chair)?: A Little Help needed to walk in hospital room?: A Little Help needed climbing 3-5 steps with a railing? : A Lot 6 Click Score: 17    End of Session Equipment Utilized During Treatment: Oxygen;Gait belt Activity Tolerance: Patient tolerated treatment well Patient left: in chair;with call bell/phone within reach;with nursing/sitter in room;with family/visitor present Nurse Communication: Mobility status;Patient requests pain meds PT Visit Diagnosis: Muscle weakness (generalized) (M62.81);History of falling  (Z91.81);Difficulty in walking, not elsewhere classified (R26.2);Pain;Unsteadiness on feet (R26.81);Other abnormalities of gait and mobility (R26.89) Pain - part of body:  (back)     Time: 6578-4696 PT Time Calculation (min) (ACUTE ONLY): 28 min  Charges:  $Gait Training: 8-22 mins $Therapeutic Activity: 8-22 mins                     Marisa Severin, PT, DPT Acute Rehabilitation Services Pager [phone] Office [place] 11/18/2021, 9:28 AM

## 2021-11-18 NOTE — Progress Notes (Addendum)
[facility] [place]  Subjective  - Pain left LQ otherwise feels like she is improving.  Assessment/Planning: symptomatic type B aortic dissection despite medical management  71 y.o. female is s/p  thoracic branch endovascular aortic repair complicated by iliac rupture. Rupture repaired by common iliac to common femoral bypass  She is tolerating PO intake without N/V.    PT/OT recommending CIR once stable.  Encourage mobility. Moving all 4 extremities, states  Mild right UE edema, DVT study negative.  Elevation and active ROM encouraged.  Afebrile, lungs non labored breathing, IS at bedside. Leukocytosis 11.2 11/17/21.  Antibiotics stopped for management of pneumonia previously on CT post op.  Will monitor.   I will place lab for am 11/19/21.    Continue to monitor pain control with CC being left LQ pain.  Torodol was restarted over the weekend with continued Oxy PO PRN and IV dilaudid for breakthrough pain.   Acute surgical blood loss anemia requiring 8 units PRBC, 4 units plasma, and I unit of Plt.  HGB stable 9.2 over the last several days.    SQ heparin for DVT prophylaxis.   BP controlled Cored and 20 mg IV of labetalol x 1 04/07/49 for systolic in upper 9326 range.  Currently 116/64    Objective 116/64 76 98.7 F (37.1 C) (Oral) (!) 21 90%  Intake/Output Summary (Last 24 hours) at 11/18/2021 0723 Last data filed at 11/17/2021 1533 Gross per 24 hour  Intake 240 ml  Output 700 ml  Net -460 ml    Moving all 4 ext  Well perfused B LE/B UE Left LQ firmness with pain to palpation.  Incision healing well without skin compromise.  NTTP central ad left abdomin Lungs non labored breathing, speech clear Heart RRR, BP well controlled    Darlene Griffith 11/18/2021 7:23 AM --  Laboratory Lab Results: Recent Labs    11/16/21 0404 11/17/21 1030  WBC 10.6* 11.2*  HGB 9.3* 9.3*  HCT 28.5* 28.2*  PLT 207 229   BMET Recent Labs     11/16/21 0404 11/17/21 1030  NA 137 135  K 3.4* 3.4*  CL 100 99  CO2 27 26  GLUCOSE 90 102*  BUN 6* 7*  CREATININE 0.52 0.43*  CALCIUM 8.3* 8.2*    COAG Lab Results  Component Value Date   INR 1.1 11/11/2021   INR 1.1 11/11/2021   INR 1.0 11/06/2021   No results found for: PTT   VASCULAR STAFF ADDENDUM: I have independently interviewed and examined the patient. I agree with the above.  Globally improving. Mobilizing with therapy. Taking more PO. Physical exam reassuring: abdomen benign. Lower extremities well perfused. Incisions healing well.  Check labs again tomorrow. Pending disposition to CIR. I anticipate she will be ready later this week.  [name]. Stanford Breed, MD [facility] Phone Number: [phone] 11/18/2021 8:30 AM

## 2021-11-19 ENCOUNTER — Inpatient Hospital Stay (HOSPITAL_COMMUNITY): Payer: BC Managed Care – PPO

## 2021-11-19 LAB — BASIC METABOLIC PANEL
Anion gap: 8 (ref 5–15)
BUN: 7 mg/dL — ABNORMAL LOW (ref 8–23)
CO2: 30 mmol/L (ref 22–32)
Calcium: 8.6 mg/dL — ABNORMAL LOW (ref 8.9–10.3)
Chloride: 100 mmol/L (ref 98–111)
Creatinine, Ser: 0.41 mg/dL — ABNORMAL LOW (ref 0.44–1.00)
GFR, Estimated: >60 mL/min (ref >60)
Glucose, Bld: 96 mg/dL (ref 70–99)
Potassium: 3.6 mmol/L (ref 3.5–5.1)
Sodium: 138 mmol/L (ref 135–145)

## 2021-11-19 LAB — CBC
HCT: 30.2 % — ABNORMAL LOW (ref 36.0–46.0)
Hemoglobin: 9.7 g/dL — ABNORMAL LOW (ref 12.0–15.0)
MCH: 30.7 pg (ref 26.0–34.0)
MCHC: 32.1 g/dL (ref 30.0–36.0)
MCV: 95.6 fL (ref 80.0–100.0)
Platelets: 200 10*3/uL (ref 150–400)
RBC: 3.16 MIL/uL — ABNORMAL LOW (ref 3.87–5.11)
RDW: 14.5 % (ref 11.5–15.5)
WBC: 11 10*3/uL — ABNORMAL HIGH (ref 4.0–10.5)
nRBC: 0 % (ref 0.0–0.2)

## 2021-11-19 LAB — GLUCOSE, CAPILLARY
Glucose-Capillary: 94 mg/dL (ref 70–99)
Glucose-Capillary: 101 mg/dL — ABNORMAL HIGH (ref 70–99)
Glucose-Capillary: 139 mg/dL — ABNORMAL HIGH (ref 70–99)
Glucose-Capillary: 107 mg/dL — ABNORMAL HIGH (ref 70–99)
Glucose-Capillary: 110 mg/dL — ABNORMAL HIGH (ref 70–99)
Glucose-Capillary: 102 mg/dL — ABNORMAL HIGH (ref 70–99)

## 2021-11-19 NOTE — Plan of Care (Signed)
°  Problem: Education: Goal: Knowledge of General Education information will improve Description: Including pain rating scale, medication(s)/side effects and non-pharmacologic comfort measures Outcome: Progressing   Problem: Clinical Measurements: Goal: Will remain free from infection Outcome: Progressing Goal: Diagnostic test results will improve Outcome: Progressing Goal: Respiratory complications will improve Outcome: Progressing Goal: Cardiovascular complication will be avoided Outcome: Progressing   Problem: Activity: Goal: Risk for activity intolerance will decrease Outcome: Not Progressing   Problem: Nutrition: Goal: Adequate nutrition will be maintained Outcome: Not Progressing   Problem: Coping: Goal: Level of anxiety will decrease Outcome: Not Progressing

## 2021-11-19 NOTE — Progress Notes (Addendum)
Progress Note    11/19/2021 7:16 AM 8 Days Post-Op  Subjective:  c/o pain in her breasts.  Says her daughter brought her a bra and it has helped somewhat.  Daughter on phone and has concerns about bone fractures after CPR and why is pain getting worse when it should be getting better.  Afebrile HR 70's-80's 235'T-732'K systolic 02% RA  Vitals:   11/18/21 2331 11/19/21 0426  BP: (!) 148/67 (!) 153/67  Pulse: 82 78  Resp: 20 17  Temp: 98.9 F (37.2 C) 98.9 F (37.2 C)  SpO2: 91% (!) 89%    Physical Exam: Cardiac:  regular Lungs:  non labored on room air Incisions:  left lower abdominal and left groin incisions look good with staples in tact.  Extremities:  easily palpable bilateral DP pulses.  Abdomen:  soft, NT/ND  CBC    Component Value Date/Time   WBC 11.2 (H) 11/17/2021 1030   RBC 3.01 (L) 11/17/2021 1030   HGB 9.3 (L) 11/17/2021 1030   HCT 28.2 (L) 11/17/2021 1030   PLT 229 11/17/2021 1030   MCV 93.7 11/17/2021 1030   MCH 30.9 11/17/2021 1030   MCHC 33.0 11/17/2021 1030   RDW 14.5 11/17/2021 1030   LYMPHSABS 1.6 11/17/2021 1030   MONOABS 1.2 (H) 11/17/2021 1030   EOSABS 0.1 11/17/2021 1030   BASOSABS 0.0 11/17/2021 1030    BMET    Component Value Date/Time   NA 135 11/17/2021 1030   K 3.4 (L) 11/17/2021 1030   CL 99 11/17/2021 1030   CO2 26 11/17/2021 1030   GLUCOSE 102 (H) 11/17/2021 1030   BUN 7 (L) 11/17/2021 1030   CREATININE 0.43 (L) 11/17/2021 1030   CALCIUM 8.2 (L) 11/17/2021 1030   GFRNONAA >60 11/17/2021 1030   GFRAA >60 01/14/2020 1124    INR    Component Value Date/Time   INR 1.1 11/11/2021 2147     Intake/Output Summary (Last 24 hours) at 11/19/2021 0716 Last data filed at 11/19/2021 0400 Gross per 24 hour  Intake 10 ml  Output 400 ml  Net -390 ml     Assessment/Plan:  71 y.o. female is s/p:  symptomatic type B aortic dissection despite medical management with thoracic branch endovascular aortic repair complicated by  iliac rupture. Rupture repaired by common iliac to common femoral bypass 8 Days Post-Op   -pt with easily palpable DP pulses bilaterally -pt is on room air-continue working on IS and mobilizing -labs for today are pending-I don't see order for labs so will place order for CBC/BMP this morning.   Pt is afebrile -pt and daughter concerned about pain and possible fracture-will get PA and lateral CXR today -acute blood loss anemia was stable on labs a couple of days ago and pt is tolerating.   PT/OT recommending CIR-authorization process to start.  Continue oob to chair and mobilizing.  -DVT prophylaxis:  sq heparin   Leontine Locket, PA-C [facility] [phone] 11/19/2021 7:16 AM   VASCULAR STAFF ADDENDUM: I have independently interviewed and examined the patient. I agree with the above.  Persistent chest discomfort likely from chest compressions. No rib fractures seen on CT angiogram 11/13/21. Will check CXR. Pending labs. Exam reassuring - reproducible chest wall tenderness.  Mobilizing well. Up on side of bed on my arrival. Continue PT / OT / OOB.  Anticipate CIR later this week. She is medically ready pending CXR and labs.   [name]. Stanford Breed, MD [facility]  Office Phone Number: [phone] 11/19/2021 8:50 AM

## 2021-11-19 NOTE — Progress Notes (Addendum)
Inpatient Rehab Admissions Coordinator:   Awaiting determination from [facility] regarding CIR prior auth request.  Will continue to follow.  Spoke to daughter on the phone to provide update.  Discussed pain control and med management on rehab as well.    Shann Medal, PT, DPT Admissions Coordinator [phone] 11/19/21  10:44 AM

## 2021-11-19 NOTE — Progress Notes (Signed)
Mobility Specialist: Progress Note   11/19/21 1110  Mobility  Activity Refused mobility   Pt refused mobility d/t chest pain. Pt said she tried to get up to the [facility] earlier and couldn't d/t her pain level. Will f/u as able.   [facility] Martavis Gurney Mobility Specialist Mobility Specialist 4 [place]: [phone] Mobility Specialist 2 [place]: [phone]

## 2021-11-19 NOTE — Plan of Care (Signed)
°  Problem: Clinical Measurements: Goal: Will remain free from infection Outcome: Progressing Goal: Respiratory complications will improve Outcome: Progressing Goal: Cardiovascular complication will be avoided Outcome: Progressing   Problem: Activity: Goal: Risk for activity intolerance will decrease Outcome: Progressing   Problem: Nutrition: Goal: Adequate nutrition will be maintained Outcome: Progressing   Problem: Elimination: Goal: Will not experience complications related to urinary retention Outcome: Progressing   Problem: Safety: Goal: Ability to remain free from injury will improve Outcome: Progressing   Problem: Elimination: Goal: Will not experience complications related to bowel motility Outcome: Not Progressing

## 2021-11-20 ENCOUNTER — Inpatient Hospital Stay (HOSPITAL_COMMUNITY): Payer: BC Managed Care – PPO

## 2021-11-20 LAB — GLUCOSE, CAPILLARY
Glucose-Capillary: 101 mg/dL — ABNORMAL HIGH (ref 70–99)
Glucose-Capillary: 105 mg/dL — ABNORMAL HIGH (ref 70–99)
Glucose-Capillary: 227 mg/dL — ABNORMAL HIGH (ref 70–99)
Glucose-Capillary: 89 mg/dL (ref 70–99)
Glucose-Capillary: 92 mg/dL (ref 70–99)
Glucose-Capillary: 99 mg/dL (ref 70–99)

## 2021-11-20 MED ORDER — NAPROXEN 250 MG PO TABS
250.0000 mg | ORAL_TABLET | Freq: Two times a day (BID) | ORAL | Status: DC
  Administered 2021-11-20 – 2021-11-21 (×2): 250 mg via ORAL
  Filled 2021-11-20 (×3): qty 1

## 2021-11-20 MED ORDER — POLYETHYLENE GLYCOL 3350 17 G PO PACK
17.0000 g | PACK | Freq: Every day | ORAL | Status: DC
  Administered 2021-11-20 – 2021-11-21 (×2): 17 g via ORAL
  Filled 2021-11-20 (×2): qty 1

## 2021-11-20 MED ORDER — GABAPENTIN 300 MG PO CAPS
300.0000 mg | ORAL_CAPSULE | Freq: Three times a day (TID) | ORAL | Status: DC
  Administered 2021-11-20: 300 mg via ORAL
  Filled 2021-11-20: qty 1

## 2021-11-20 MED ORDER — BISACODYL 10 MG RE SUPP: 10.0000 mg | Freq: Every day | RECTAL | Status: DC | PRN

## 2021-11-20 MED ORDER — METHOCARBAMOL 500 MG PO TABS
500.0000 mg | ORAL_TABLET | Freq: Three times a day (TID) | ORAL | Status: DC
  Administered 2021-11-20: 500 mg via ORAL
  Filled 2021-11-20: qty 1

## 2021-11-20 MED ORDER — KETOROLAC TROMETHAMINE 15 MG/ML IJ SOLN: 15.0000 mg | Freq: Four times a day (QID) | INTRAMUSCULAR | Status: DC | PRN

## 2021-11-20 NOTE — Progress Notes (Signed)
Occupational Therapy Treatment Patient Details Name: Darlene Griffith MRN: [id number] DOB: 1951-01-08 Today's Date: 11/20/2021   History of present illness Pt is a 71 y.o. female who presented 11/06/21 with acute onset CP. EKG w/ sinus rhythm. Troponin and BNP WNL. CTA shows type B aortic dissection that extends into the infrarenal abdominal aorta and right common iliac artery. S/p thoracic branch device TEVAR complicated by iliac rupture requiring emergent left common iliac - common femoral bypass 1/23. ETT 1/23 - 1/24. PMH: HTN, hepatic cyst, hyperthyroidism, multinodular goiter, pulmonary and colonic polyps, splenic cyst   OT comments  Pt progressed from bed to chair this session with pain rating decreased and reports increased comfort. Pt falling asleep once in the chair in upright position. Pt provided warm blanket for comfort with music temptations playing. Pt anxious due to cognitive deficits. Recommendation remains CIR at this time.    Recommendations for follow up therapy are one component of a multi-disciplinary discharge planning process, led by the attending physician.  Recommendations may be updated based on patient status, additional functional criteria and insurance authorization.    Follow Up Recommendations  Acute inpatient rehab (3hours/day)    Assistance Recommended at Discharge None  Patient can return home with the following  A lot of help with walking and/or transfers;A lot of help with bathing/dressing/bathroom;Assist for transportation   Equipment Recommendations  BSC/3in1    Recommendations for Other Services Rehab consult    Precautions / Restrictions Precautions Precautions: Fall;Other (comment) Precaution Comments: Keep SBP <160       Mobility Bed Mobility Overal bed mobility: Needs Assistance Bed Mobility: Supine to Sit, Sit to Supine     Supine to sit: Min assist     General bed mobility comments: pt with bil LE off the EOB and helicopter like method  used to help sit EOB. pt with pad used to help progress to eob. pt static sitting min guard    Transfers Overall transfer level: Needs assistance Equipment used: Rolling walker (2 wheels) Transfers: Sit to/from Stand Sit to Stand: Mod assist, From elevated surface Stand pivot transfers: Mod assist, From elevated surface         General transfer comment: pt needs slow transfer due to anixous but able to progress     Balance Overall balance assessment: Needs assistance Sitting-balance support: Bilateral upper extremity supported, Feet supported Sitting balance-Leahy Scale: Fair     Standing balance support: Bilateral upper extremity supported, Reliant on assistive device for balance                               ADL either performed or assessed with clinical judgement   ADL Overall ADL's : Needs assistance/impaired Eating/Feeding: Set up                       Toilet Transfer: Moderate assistance   Toileting- Clothing Manipulation and Hygiene: Maximal assistance         General ADL Comments: OOB to chair this session. pt immediately expressed back feeling better. pt falling asleep shortly after repositioning    Extremity/Trunk Assessment Upper Extremity Assessment Upper Extremity Assessment: Generalized weakness   Lower Extremity Assessment Lower Extremity Assessment: Generalized weakness        Vision       Perception     Praxis      Cognition Arousal/Alertness: Lethargic, Suspect due to medications Behavior During Therapy: Flat affect, Anxious Overall  Cognitive Status: Impaired/Different from baseline Area of Impairment: Orientation, Attention, Memory, Following commands, Safety/judgement, Awareness, Problem solving                 Orientation Level: Disoriented to, Place, Time, Situation Current Attention Level: Sustained Memory: Decreased recall of precautions, Decreased short-term memory Following Commands: Follows one  step commands with increased time Safety/Judgement: Decreased awareness of safety, Decreased awareness of deficits Awareness: Intellectual Problem Solving: Slow processing, Difficulty sequencing General Comments: Pt anxious and upset on the phone with daughter on arrival. pt states "oh god i am losing my mind. all my children saw me Sunday adn i dont remember. I can't remember. I am losing my mind." Pt calmed with "temptations" music played on phone and then later on the [place] in the room for continuous play. the patient immediately smiling and singing along. pt asking "where am I" "what happen to me" "what have they done to me" Pt educated on all questions. Family calling again during session and expressed they will have family come mid afternoon to check on her.        Exercises      Shoulder Instructions       General Comments VSS RA    Pertinent Vitals/ Pain       Pain Assessment Pain Assessment: Faces Faces Pain Scale: Hurts little more Pain Location: back / L side Pain Descriptors / Indicators: Discomfort Pain Intervention(s): Monitored during session, Premedicated before session, Repositioned  Home Living                                          Prior Functioning/Environment              Frequency  Min 2X/week        Progress Toward Goals  OT Goals(current goals can now be found in the care plan section)  Progress towards OT goals: Progressing toward goals  Acute Rehab OT Goals Patient Stated Goal: to find out what happen to me. OT Goal Formulation: With patient Time For Goal Achievement: 11/27/21 Potential to Achieve Goals: Good ADL Goals Pt Will Perform Grooming: sitting;with supervision Pt Will Perform Upper Body Bathing: sitting;with supervision Additional ADL Goal #1: pt will complete bed mobility Supervision as precursor to adls. Additional ADL Goal #2: pt will tolerate static sitting mod i(A) as precursor to adls.  Plan Discharge  plan remains appropriate    Co-evaluation                 AM-PAC OT "6 Clicks" Daily Activity     Outcome Measure   Help from another person eating meals?: A Little Help from another person taking care of personal grooming?: A Little Help from another person toileting, which includes using toliet, bedpan, or urinal?: A Lot Help from another person bathing (including washing, rinsing, drying)?: A Lot Help from another person to put on and taking off regular upper body clothing?: A Lot Help from another person to put on and taking off regular lower body clothing?: A Lot 6 Click Score: 14    End of Session    OT Visit Diagnosis: Unsteadiness on feet (R26.81)   Activity Tolerance Patient tolerated treatment well   Patient Left in chair;with call bell/phone within reach;with chair alarm set   Nurse Communication Mobility status;Precautions;Need for lift equipment        Time: 1275-1700 OT Time Calculation (  min): 22 min  Charges: OT General Charges $OT Visit: 1 Visit OT Treatments $Self Care/Home Management : 8-22 mins   Brynn, OTR/L  Acute Rehabilitation Services Pager: [phone] Office: [phone] .   Jeri Modena 11/20/2021, 12:08 PM

## 2021-11-20 NOTE — Progress Notes (Addendum)
Progress Note    11/20/2021 7:41 AM 9 Days Post-Op  Subjective:  continues to have pain in breasts and now having pain today in right hip/ low back. Says last night was the worst night she has had. Daughter on phone with continued concerns about her pain, and fractures   Vitals:   11/20/21 0354 11/20/21 0443  BP: (!) 179/78 (!) 161/67  Pulse: 77   Resp: 19   Temp: 98.6 F (37 C)   SpO2: 94%    Physical Exam: Cardiac:  regular Lungs:  non labored Incisions: left lower abdomen and left groin incisions well appearing and intact. No drainage. Right femoral access site c/d/I without swelling or Hematoma. Left brachial access site c/d/I without swelling or hematoma. Staples intact Extremities: well perfused and warm with palpable pulses bilaterally Abdomen:  soft, non distended Neurologic: alert and oriented  CBC    Component Value Date/Time   WBC 11.0 (H) 11/19/2021 0800   RBC 3.16 (L) 11/19/2021 0800   HGB 9.7 (L) 11/19/2021 0800   HCT 30.2 (L) 11/19/2021 0800   PLT 200 11/19/2021 0800   MCV 95.6 11/19/2021 0800   MCH 30.7 11/19/2021 0800   MCHC 32.1 11/19/2021 0800   RDW 14.5 11/19/2021 0800   LYMPHSABS 1.6 11/17/2021 1030   MONOABS 1.2 (H) 11/17/2021 1030   EOSABS 0.1 11/17/2021 1030   BASOSABS 0.0 11/17/2021 1030    BMET    Component Value Date/Time   NA 138 11/19/2021 0800   K 3.6 11/19/2021 0800   CL 100 11/19/2021 0800   CO2 30 11/19/2021 0800   GLUCOSE 96 11/19/2021 0800   BUN 7 (L) 11/19/2021 0800   CREATININE 0.41 (L) 11/19/2021 0800   CALCIUM 8.6 (L) 11/19/2021 0800   GFRNONAA >60 11/19/2021 0800   GFRAA >60 01/14/2020 1124    INR    Component Value Date/Time   INR 1.1 11/11/2021 2147     Intake/Output Summary (Last 24 hours) at 11/20/2021 0741 Last data filed at 11/20/2021 0401 Gross per 24 hour  Intake 360 ml  Output 1700 ml  Net -1340 ml     Assessment/Plan:  71 y.o. female is s/p symptomatic type B aortic dissection despite medical  management with thoracic branch endovascular aortic repair complicated by iliac rupture. Rupture repaired by common iliac to common femoral bypass 9 Days Post-Op   Pain control still an issue. Will try Toradol today Incisions well appearing. Lower extremities are well perfused and warm with palpable pulses CXR showed no rib fractures Reassurance provided to patient and her daughter that this pain should improve with time. Continue IS and mobilization Hemodynamically stable Continue to mobilize Awaiting CIR auth  DVT Prophylaxis: sq Heparin   Karoline Caldwell, PA-C [facility] [phone] 11/20/2021 7:41 AM  VASCULAR STAFF ADDENDUM: I have independently interviewed and examined the patient. I agree with the above.  Rapid response called to bedside given change in mental status. On exam patient is nonfocal.  She is a bit confused.  She is not sure of the events that caused her hospitalization.  I reviewed these in detail with her. Exam is otherwise benign.  Her abdomen is soft.  Her incisions are healing well.  She has posterior tibial pulses bilaterally. A noncontrast CT scan of her head.  We will adjust her pain medicine regimen.  We will avoid narcotics going forward.  [name]. Stanford Breed, MD [facility] Phone Number: [phone] 11/20/2021 1:45 PM

## 2021-11-20 NOTE — Progress Notes (Addendum)
Inpatient Rehab Admissions Coordinator:    I have insurance approval and a bed available for pt to admit to CIR today. Dr. Stanford Breed to see and confirm she's ready to admit.  Will let pt/family and TOC team know.   Addendum 1530: Per Dr. Stanford Breed pt with change in mental status.  Will hold admit today and reassess tomorrow.   Shann Medal, PT, DPT Admissions Coordinator [phone] 11/20/21  12:17 PM

## 2021-11-20 NOTE — H&P (Signed)
Physical Medicine and Rehabilitation Admission H&P    Chief Complaint  Patient presents with   GENERALIZED PAIN  Functional deficits/debility secondary to aortic dissection with repair, brief cardiac arrest and acute blood loss anemia:  HPI: Quincee Gittens is a 71 year old female who presented to the emergency department with acute onset of the upper thoracic region pain radiating circumferentially around her chest. This was approximately 1 hour to presentation on 11/06/2021.  Past medical history significant for hypertension on Norvasc and Cozaar. Imaging revealed Type B aortic dissection extending into the infra-renal aorta and right common iliac artery. Vascular consultation obtained by Dr. Stanford Breed. Admitted to CCU by CCM. Esmolol started for hypertension but changed to labetalol for better efficacy. ECG with sinus rhythm and troponin and BNP were within normal limits.  Follow-up CTA of the chest performed on 1/20 with stable appearance of dissection.  Despite medical management, she continued to have persistent 10 out of 10 mid scapular back pain radiating to her chest and abdomen.  On 1/23 she was taken to the operating room and underwent thoracic branched endovascular aortic repair with a left common and external iliac stenting, left common iliac to common femoral artery bypass with PTFE graft.  This was performed through bilateral common femoral artery access and left brachial artery exposure percutaneous access.  During the procedure, a rapid drop in blood pressure occurred and angiogram confirmed complete disruption of the external iliac artery.  Endovascular repair was attempted and the patient's blood pressure continued further decline and CPR was initiated which restored a perfusion pressure.  ROSC was achieved within 3 minutes.  Rapid exposure of the infrarenal aorta was achieved to the left retroperitoneal exposure.  A surgical drain was left to the retroperitoneal space.  At completion of  the case, Doppler flow was confirmed in the left radial artery and bilateral posterior tibial arteries.  She remained intubated and observed in the ICU.  She required Cardene infusion on 1/20 for hypertension follow-up CTA was performed on 1/24 which revealed possible pneumonia and cefepime initiated.  She was extubated on 1/24 and continued observation in the ICU.  Her nasogastric tube was discontinued on 1/28 and she was tolerating small amounts of p.o. intake.  Cefepime was stopped on 1/30.  Her hemoglobin remained stable at 9.2. The patient requires inpatient medicine and rehabilitation evaluations and services for ongoing dysfunction secondary to aortic dissection repair complicated by brief cardiac arrest and acute blood loss anemia, intensive care monitoring.  Other significant medical history includes hyperlipidemia, tobacco abuse, overactive bladder, gastroesophageal reflux disease, COPD, seizure disorder.  Underwent cervical disc arthroplasty in 2020 by Dr. Ellene Route.  Lives alone with excellent family support and very involved with grandchildren. Has one dog at home, [place]. She is being cared for presently by one of the children  Review of Systems  Unable to perform ROS: Mental acuity  Cardiovascular:        Reports pleuritic chest pain  Gastrointestinal:  Positive for constipation.  Genitourinary:  Negative for dysuria.  Musculoskeletal:  Positive for back pain.       Left mid-upper back pain>>worse after ambulating, relieved somewhat with pillow support. Anterior chest wall pain resolved. Complains of left axillary pain when elevating arm.  Neurological:  Positive for tingling.       Alert and oriented. Reports new memory issues.  Reports left foot tingling, no pain  Psychiatric/Behavioral:  Positive for memory loss.   Past Medical History:  Diagnosis Date   Chest pain  a. 01/2015 Echo: Nl LV fxn, Gr 1 DD, triv AI, mild MR.   Essential hypertension    Family history of lung cancer     Hepatic cyst    a. noted on CT 01/2015.   Hyperthyroidism    GOING TO [facility] FOR SECOND OPINION   Multinodular goiter    a. 01/2015 CT chest: multinodular goidter w/ substernal extension of the left lobe of the thyroid assoc w/ rightward deviation of tracheal air column.   Neck pain, chronic    Personal history of colonic polyps    Pulmonary nodules    a. 01/2015 CT Chest: RLL ~ 16mm subpleural nodule - rec f/u in 6-12 mos.   Splenic cyst    a. noted on CT 01/2015.   Past Surgical History:  Procedure Laterality Date   BYPASS GRAFT AORTA TO AORTA Left 11/11/2021   Procedure: BYPASS GRAFT AORTA TO LEFT COMMON  FEMORAL ARTERY;  Surgeon: Cherre Robins, MD;  Location: [facility];  Service: Vascular;  Laterality: Left;   CERVICAL DISC ARTHROPLASTY N/A 07/05/2019   Procedure: Cervical Six-Seven Artificial disc replacement;  Surgeon: Kristeen Miss, MD;  Location: [facility];  Service: Neurosurgery;  Laterality: N/A;  Cervical Six-Seven Artificial disc replacement   CESAREAN SECTION     COLONOSCOPY  01/13/2020   THORACIC AORTIC ENDOVASCULAR STENT GRAFT N/A 11/11/2021   Procedure: THORACIC AORTIC ENDOVASCULAR STENT GRAFT WITH LEFT BRACHIAL  ARTERY ACCESS;  Surgeon: Cherre Robins, MD;  [facility];  Service: Vascular;  Laterality: N/A;   TONSILLECTOMY     AROUND 5-6 YRS OLD   ULTRASOUND GUIDANCE FOR VASCULAR ACCESS Bilateral 11/11/2021   Procedure: ULTRASOUND GUIDANCE FOR VASCULAR ACCESS;  Surgeon: Cherre Robins, MD;  Location: [facility] OR;  Service: Vascular;  Laterality: Bilateral;   UPPER GASTROINTESTINAL ENDOSCOPY  01/13/2020   Family History  Problem Relation Age of Onset   Heart attack Maternal Grandmother        deceased   High blood pressure Mother    High Cholesterol Mother    Thyroid disease Mother    Thyroid disease Sister    Lung cancer Father    Cancer Maternal Uncle        unk type   Cancer Sister        unk type   Colon cancer Neg Hx    Colon polyps Neg Hx    Esophageal cancer  Neg Hx    Rectal cancer Neg Hx    Stomach cancer Neg Hx    Social History:  reports that she has been smoking cigarettes. She has never used smokeless tobacco. She reports current alcohol use. She reports that she does not use drugs. Allergies:  Allergies  Allergen Reactions   Shellfish Allergy Anaphylaxis   Medications Prior to Admission  Medication Sig Dispense Refill   amLODipine (NORVASC) 5 MG tablet Take 1 tablet (5 mg total) by mouth at bedtime. 90 tablet 1   Aspirin-Acetaminophen (GOODYS BACK & BODY PAIN PO) Take 1 Package by mouth daily as needed (pain).     aspirin-acetaminophen-caffeine (EXCEDRIN MIGRAINE) 250-250-65 MG tablet Take 2 tablets by mouth every 6 (six) hours as needed for headache.     clotrimazole-betamethasone (LOTRISONE) cream Apply to affected area 2 times daily prn 15 g 0   atorvastatin (LIPITOR) 40 MG tablet Take 1 tablet by mouth once daily (Patient not taking: Reported on 11/06/2021) 90 tablet 1   diclofenac Sodium (VOLTAREN) 1 % GEL Apply 4 g topically 4 (four)  times daily. (Patient not taking: Reported on 11/06/2021) 100 g 0   losartan (COZAAR) 25 MG tablet Take 1 tablet (25 mg total) by mouth daily. (Patient not taking: Reported on 11/06/2021) 30 tablet 1   MYRBETRIQ 25 MG TB24 tablet Take 1 tablet by mouth once daily (Patient not taking: Reported on 11/06/2021) 30 tablet 0   Tiotropium Bromide Monohydrate (SPIRIVA RESPIMAT) 2.5 MCG/ACT AERS Inhale 2 puffs into the lungs daily. (Patient not taking: Reported on 11/06/2021) 4 g 0    Drug Regimen Review  Drug regimen was reviewed and remains appropriate with no significant issues identified  Home: Home Living Family/patient expects to be discharged to:: Private residence Living Arrangements: Alone Available Help at Discharge: Family, Available 24 hours/day Type of Home: House Home Access: Stairs to enter [employer] of Steps: 2 Entrance Stairs-Rails: None Home Layout: One level Bathroom  Shower/Tub: Chiropodist: Standard Home Equipment: BSC/3in1   Functional History: Prior Function Prior Level of Function : Independent/Modified Independent, Driving, History of Falls (last six months) Mobility Comments: Does not use an AD but reports multiple recent falls. ADLs Comments: Not driving as often anymore.  Functional Status:  Mobility: Bed Mobility Overal bed mobility: Needs Assistance Bed Mobility: Rolling, Supine to Sit, Sit to Supine Rolling: Supervision Supine to sit: Min assist Sit to supine: Min assist General bed mobility comments: Able to get to EOB without assist, increased time. Transfers Overall transfer level: Needs assistance Equipment used: 1 person hand held assist Transfers: Sit to/from Stand Sit to Stand: Min guard Bed to/from chair/wheelchair/BSC transfer type:: Step pivot Step pivot transfers: Min assist Transfer via Lift Equipment: [place] transfer comment: Min guard for safety to stand from EOB, MIn A to step pivot to get to [facility], flexed trunk. Stood from chair X1. Ambulation/Gait Ambulation/Gait assistance: Min assist, Min guard, +2 safety/equipment Gait Distance (Feet): 45 Feet Assistive device: Rolling walker (2 wheels) Gait Pattern/deviations: Step-through pattern, Decreased stride length, Decreased step length - left, Decreased step length - right, Trunk flexed General Gait Details: Slow, mildly unsteady gait with decreased step lengths bilaterally and constant cues to use hands on RW and not lean on forearms. VSS on RA. Gait velocity: very slow Gait velocity interpretation: <1.31 ft/sec, indicative of household ambulator    ADL: ADL Overall ADL's : Needs assistance/impaired Eating/Feeding: Set up Grooming: Applying deodorant Upper Body Bathing: Minimal assistance Lower Body Bathing: Moderate assistance Upper Body Dressing : Minimal assistance Lower Body Dressing: Moderate assistance Toilet Transfer:  Minimal assistance General ADL Comments: pt hoyer lifted to chair to help progress to upright posture and help to mentally progress in task. pt tolerated hoyer and reports that it "wasnt bad"  Cognition: Cognition Overall Cognitive Status: Impaired/Different from baseline Orientation Level: Oriented X4 Cognition Arousal/Alertness: Awake/alert Behavior During Therapy: WFL for tasks assessed/performed Overall Cognitive Status: Impaired/Different from baseline Area of Impairment: Awareness Awareness: Emergent General Comments: Pt in good spirits today; laughing appropriately with this therapist. Awake and alert. Needs some encouragement.  Physical Exam: Blood pressure (!) 154/65, pulse 88, temperature 98.6 F (37 C), temperature source Oral, resp. rate 19, height 5\' 1"  (1.549 m), weight 68.3 kg, SpO2 94 %. Physical Exam Vitals and nursing note reviewed.  Constitutional:      Appearance: She is obese.     Comments: Pt sitting up in bed; asking to be set up/then put back down- vague, tangential; couldn't remember that was c/o constipation to nursing (per nursing) 20 minutes prior; says "I don't know  where my head is at". NAD  HENT:     Head: Normocephalic and atraumatic.     Comments: Smile equal; tongue midline    Nose: Nose normal. No congestion.     Mouth/Throat:     Mouth: Mucous membranes are moist.     Pharynx: Oropharynx is clear. No oropharyngeal exudate.  Eyes:     General:        Right eye: No discharge.        Left eye: No discharge.     Extraocular Movements: Extraocular movements intact.  Cardiovascular:     Rate and Rhythm: Normal rate and regular rhythm.     Heart sounds: Normal heart sounds. No murmur heard.   No gallop.  Pulmonary:     Effort: Pulmonary effort is normal. No respiratory distress.     Breath sounds: Normal breath sounds. No wheezing or rales.  Abdominal:     General: Bowel sounds are normal. There is no distension.     Palpations: Abdomen is  soft.     Tenderness: There is no abdominal tenderness.  Musculoskeletal:        General: No swelling or deformity.     Cervical back: Neck supple. No tenderness.     Comments: 4+/5 in Biceps, triceps, WE, grip and FA B/L  4+/5 in HF/KE/DF and PF B/L- Poor effort vs weakness- hard to determine  Skin:    Comments: L abdominal incision from L of umbilicus to LLQ- staples intact- no drainage; a little puffy L groin /inguinal incision- from Mid inguinal to pubis- slightly moist- staples intact- no drainage seen- no erythema on either.    Neurological:     Mental Status: She is alert.     Comments: Pleasantly confused; vague, tangential- Ox1-2  Psychiatric:        Mood and Affect: Mood normal.        Behavior: Behavior normal.    Results for orders placed or performed during the hospital encounter of 11/06/21 (from the past 48 hour(s))  Glucose, capillary     Status: Abnormal   Collection Time: 11/18/21 11:24 AM  Result Value Ref Range   Glucose-Capillary 114 (H) 70 - 99 mg/dL    Comment: Glucose reference range applies only to samples taken after fasting for at least 8 hours.  Glucose, capillary     Status: Abnormal   Collection Time: 11/18/21  5:14 PM  Result Value Ref Range   Glucose-Capillary 150 (H) 70 - 99 mg/dL    Comment: Glucose reference range applies only to samples taken after fasting for at least 8 hours.  Glucose, capillary     Status: None   Collection Time: 11/18/21  7:57 PM  Result Value Ref Range   Glucose-Capillary 86 70 - 99 mg/dL    Comment: Glucose reference range applies only to samples taken after fasting for at least 8 hours.  Glucose, capillary     Status: None   Collection Time: 11/18/21 11:34 PM  Result Value Ref Range   Glucose-Capillary 95 70 - 99 mg/dL    Comment: Glucose reference range applies only to samples taken after fasting for at least 8 hours.  Glucose, capillary     Status: None   Collection Time: 11/19/21  3:58 AM  Result Value Ref Range    Glucose-Capillary 94 70 - 99 mg/dL    Comment: Glucose reference range applies only to samples taken after fasting for at least 8 hours.  Glucose, capillary  Status: Abnormal   Collection Time: 11/19/21  7:49 AM  Result Value Ref Range   Glucose-Capillary 101 (H) 70 - 99 mg/dL    Comment: Glucose reference range applies only to samples taken after fasting for at least 8 hours.  CBC     Status: Abnormal   Collection Time: 11/19/21  8:00 AM  Result Value Ref Range   WBC 11.0 (H) 4.0 - 10.5 K/uL   RBC 3.16 (L) 3.87 - 5.11 MIL/uL   Hemoglobin 9.7 (L) 12.0 - 15.0 g/dL   HCT 30.2 (L) 36.0 - 46.0 %   MCV 95.6 80.0 - 100.0 fL   MCH 30.7 26.0 - 34.0 pg   MCHC 32.1 30.0 - 36.0 g/dL   RDW 14.5 11.5 - 15.5 %   Platelets 200 150 - 400 K/uL   nRBC 0.0 0.0 - 0.2 %    Comment: Performed at [facility], [place] [address] [place], [place] [address]  Basic metabolic panel     Status: Abnormal   Collection Time: 11/19/21  8:00 AM  Result Value Ref Range   Sodium 138 135 - 145 mmol/L   Potassium 3.6 3.5 - 5.1 mmol/L   Chloride 100 98 - 111 mmol/L   CO2 30 22 - 32 mmol/L   Glucose, Bld 96 70 - 99 mg/dL    Comment: Glucose reference range applies only to samples taken after fasting for at least 8 hours.   BUN 7 (L) 8 - 23 mg/dL   Creatinine, Ser 0.41 (L) 0.44 - 1.00 mg/dL   Calcium 8.6 (L) 8.9 - 10.3 mg/dL   GFR, Estimated >60 >60 mL/min    Comment: (NOTE) Calculated using the CKD-EPI Creatinine Equation (2021)    Anion gap 8 5 - 15    Comment: Performed at [facility] [address], [place], [place] [address]  Glucose, capillary     Status: Abnormal   Collection Time: 11/19/21 11:20 AM  Result Value Ref Range   Glucose-Capillary 139 (H) 70 - 99 mg/dL    Comment: Glucose reference range applies only to samples taken after fasting for at least 8 hours.  Glucose, capillary     Status: Abnormal   Collection Time: 11/19/21  3:56 PM  Result Value Ref Range   Glucose-Capillary  107 (H) 70 - 99 mg/dL    Comment: Glucose reference range applies only to samples taken after fasting for at least 8 hours.  Glucose, capillary     Status: Abnormal   Collection Time: 11/19/21  7:50 PM  Result Value Ref Range   Glucose-Capillary 110 (H) 70 - 99 mg/dL    Comment: Glucose reference range applies only to samples taken after fasting for at least 8 hours.  Glucose, capillary     Status: Abnormal   Collection Time: 11/19/21 11:09 PM  Result Value Ref Range   Glucose-Capillary 102 (H) 70 - 99 mg/dL    Comment: Glucose reference range applies only to samples taken after fasting for at least 8 hours.  Glucose, capillary     Status: None   Collection Time: 11/20/21  3:52 AM  Result Value Ref Range   Glucose-Capillary 89 70 - 99 mg/dL    Comment: Glucose reference range applies only to samples taken after fasting for at least 8 hours.  Glucose, capillary     Status: Abnormal   Collection Time: 11/20/21  8:16 AM  Result Value Ref Range   Glucose-Capillary 227 (H) 70 - 99 mg/dL  Comment: Glucose reference range applies only to samples taken after fasting for at least 8 hours.   DG Chest 2 View  Result Date: 11/19/2021 CLINICAL DATA:  Chest pain, generalized pain EXAM: CHEST - 2 VIEW COMPARISON:  11/12/2021 FINDINGS: No focal consolidation. No pleural effusion or pneumothorax. Heart and mediastinal contours are unremarkable. Thoracic aortic stent noted. No acute osseous abnormality. IMPRESSION: No active cardiopulmonary disease. Electronically Signed   By: Kathreen Devoid M.D.   On: 11/19/2021 09:38       Medical Problem List and Plan: 1. Functional deficits/debility secondary to aortic dissection -pt was very clear she panics when told "serious things"-   -patient may  shower- if covers incisions  -ELOS/Goals: 12-14 days- supervision 2.  Antithrombotics: -DVT/anticoagulation:  Pharmaceutical: Heparin  q 8 hours  -antiplatelet therapy: n/a 3. Pain Management: Tylenol,  Neurontin, Robaxin, oxycodone  -family said don't want her to have robaxin/gabapentin- will change to skelaxin and d/c gabapentin 4. Mood: LCSW to evaluate and provide emotional support  -antipsychotic agents: n/a 5. Neuropsych: This patient is capable of making decisions on her own behalf. 6. Skin/Wound Care: Routine skin care checks  -- Monitor LLQ, left groin incision 7. Fluids/Electrolytes/Nutrition: Routine ins and outs and follow-up chemistries  -- Heart healthy diet, thin liquids  --Glucose has been in normal range; DC SSI and follow-up BMET 8. Blood loss anemia: Monitor CBC 9. Hypertension: continue Coreg, Norvasc, Cozaar 10. Hyperlipidemia: continue Lipitor 11: Tobacco use: Provide cessation counseling  --Nicotine patch ?  --Continue pulmonary toilet 12: COPD: Continue Yupelri nebulizer daily 13: GERD: continue Protonix 14: Hyperthyroidismmulti-nodular goiter:  --Dr. Loanne Drilling, endocrinologist>>not on methimazole currently --TSH = 0.022 1/20, normal T3, T4 this admission 15: History of pulmonary nodules: stable in size of 5 mm nodule on 1/18 as compared to 2016 16. Constipation?- per nursing, LBM this AM, but feels constipated-  17. Feels cold- will order kpad for feeling cold/pain  Barbie Banner, PA-C 11/20/2021

## 2021-11-20 NOTE — PMR Pre-admission (Signed)
PMR Admission Coordinator Pre-Admission Assessment  Patient: Darlene Griffith is an 71 y.o., female MRN: [id number] DOB: 10-Dec-1950 Height: 5\' 1"  (154.9 cm) Weight: 68.6 kg  Insurance Information HMO:     PPO: yes     PCP:      IPA:      80/20:      OTHER:  PRIMARY: [place]      Policy#: [id number]       Subscriber: pt CM Name: Rex Kras      Phone#: [phone]     Fax#: [phone] Pre-Cert#: [id number] [place] for CIR from [place] with [place] with updates due to fax listed above on 11/27/21    Employer:  Benefits:  Phone #: [phone]     Name:  Eff. Date: 10/20/20     Deduct: $850 ($0)      Out of Pocket Max: $3000 ($304 met)      Life Max: n/a CIR: 80%      SNF: 80% Outpatient:      Co-Pay: $25/visit Home Health: 80%      Co-Ins: 20% DME: 80%     Co-Ins: 20% Providers: n/a SECONDARY: Medicare A/B      Policy#: [id number]      Phone#:   Financial Counselor:       Phone#:   The Actuary for patients in Inpatient Rehabilitation Facilities with attached Privacy Act [place] Records was provided and verbally reviewed with: Patient and Family  Emergency Contact Information Contact Information     Name Relation Home Work Mobile   [place] Daughter   [phone]   [name]   [phone]       Current Medical History  Patient Admitting Diagnosis: aortic dissection   History of Present Illness: Darlene Griffith is a 71 year old female who presented on 11/06/21 with acute onset of the upper thoracic region pain radiating circumferentially around her chest.  Past medical history significant for hypertension on Norvasc and Cozaar. Imaging revealed Type B aortic dissection extending into the infra-renal aorta and right common iliac artery. Vascular consultation obtained by Dr. Stanford Breed. Admitted to CCU by CCM. Esmolol started for hypertension but changed to labetalol for better efficacy. ECG with sinus rhythm and troponin and BNP  were within normal limits.  Follow-up CTA of the chest performed on 1/20 with stable appearance of dissection.  Despite medical management, she continued to have persistent 10 out of 10 mid scapular back pain radiating to her chest and abdomen.  On 1/23 she underwent thoracic branched endovascular aortic repair with a left common and external iliac stenting, left common iliac to common femoral artery bypass with PTFE graft.  This was performed through bilateral common femoral artery access and left brachial artery exposure percutaneous access.  During the procedure, a rapid drop in blood pressure occurred and angiogram confirmed complete disruption of the external iliac artery.  Endovascular repair was attempted and the patient's blood pressure continued further decline and CPR was initiated, which restored a perfusion pressure.  ROSC was achieved within 3 minutes.  Rapid exposure of the infrarenal aorta was achieved to the left retroperitoneal exposure.  A surgical drain was left to the retroperitoneal space.  At completion of the case, Doppler flow was confirmed in the left radial artery and bilateral posterior tibial arteries.  She remained intubated and observed in the ICU.  She required Cardene infusion on 1/20 for hypertension follow-up CTA was performed on 1/24 which revealed possible pneumonia and cefepime initiated.  She  was extubated on 1/24.  Post operatively developed an ileus with NG tube initiated, and it was discontinued on 1/28 and she was tolerating small amounts of p.o. intake.  Cefepime was stopped on 1/30.  Her hemoglobin remained stable at 9.2.  Therapy ongoing and recommendations are for CIR.   Patient's medical record from [name] has been reviewed by the rehabilitation admission coordinator and physician.  Past Medical History  Past Medical History:  Diagnosis Date   Chest pain    a. 01/2015 Echo: Nl LV fxn, Gr 1 DD, triv AI, mild MR.   Essential hypertension    Family history of  lung cancer    Hepatic cyst    a. noted on CT 01/2015.   Hyperthyroidism    GOING TO [facility] FOR SECOND OPINION   Multinodular goiter    a. 01/2015 CT chest: multinodular goidter w/ substernal extension of the left lobe of the thyroid assoc w/ rightward deviation of tracheal air column.   Neck pain, chronic    Personal history of colonic polyps    Pulmonary nodules    a. 01/2015 CT Chest: RLL ~ 34m subpleural nodule - rec f/u in 6-12 mos.   Splenic cyst    a. noted on CT 01/2015.    Has the patient had major surgery during 100 days prior to admission? Yes  Family History   family history includes Cancer in her maternal uncle and sister; Heart attack in her maternal grandmother; High Cholesterol in her mother; High blood pressure in her mother; Lung cancer in her father; Thyroid disease in her mother and sister.  Current Medications  Current Facility-Administered Medications:    acetaminophen (TYLENOL) tablet 650 mg, 650 mg, Darlene, Q4H PRN, PMick Sell PA-C, 650 mg at 11/20/21 1015   amLODipine (NORVASC) tablet 10 mg, 10 mg, Darlene, Daily, HCherre Robins MD, 10 mg at 11/21/21 01884  atorvastatin (LIPITOR) tablet 40 mg, 40 mg, Darlene, Daily, HCherre Robins MD, 40 mg at 11/21/21 01660  bisacodyl (DULCOLAX) suppository 10 mg, 10 mg, Rectal, Daily PRN, HCherre Robins MD   carvedilol (COREG) tablet 25 mg, 25 mg, Darlene, BID WC, HCherre Robins MD, 25 mg at 11/21/21 06301  Chlorhexidine Gluconate Cloth 2 % PADS 6 each, 6 each, Topical, Daily, DSpero Geralds MD, 6 each at 11/21/21 0844   diphenhydrAMINE (BENADRYL) injection 12.5 mg, 12.5 mg, Intravenous, Q6H PRN, 12.5 mg at 11/15/21 0816 **OR** diphenhydrAMINE (BENADRYL) 12.5 MG/5ML elixir 12.5 mg, 12.5 mg, Darlene, Q6H PRN, CNoemi ChapelP, DO   docusate sodium (COLACE) capsule 200 mg, 200 mg, Darlene, BID, HCherre Robins MD, 200 mg at 11/21/21 0842   heparin injection 5,000 Units, 5,000 Units, Subcutaneous, Q8H, HCherre Robins MD, 5,000  Units at 11/21/21 0620   hydrALAZINE (APRESOLINE) injection 20 mg, 20 mg, Intravenous, Q6H PRN, CJacky Kindle MD, 20 mg at 11/15/21 2000   HYDROmorphone (DILAUDID) injection 0.5 mg, 0.5 mg, Intravenous, Q4H PRN, HCherre Robins MD, 0.5 mg at 11/20/21 2235   insulin aspart (novoLOG) injection 0-9 Units, 0-9 Units, Subcutaneous, Q4H, Simpson, Paula B, NP, 3 Units at 11/20/21 0908   ipratropium-albuterol (DUONEB) 0.5-2.5 (3) MG/3ML nebulizer solution 3 mL, 3 mL, Nebulization, Q6H PRN, HCherre Robins MD   labetalol (NORMODYNE) injection 10-20 mg, 10-20 mg, Intravenous, Q2H PRN, Ogan, [place]wo U, MD, 20 mg at 11/17/21 0403   lidocaine (LMX) 4 % cream, , Topical, BID PRN, CJulian Hy DO, 1 application  at 11/19/21 1932   losartan (COZAAR) tablet 25 mg, 25 mg, Darlene, Daily, Jacky Kindle, MD, 25 mg at 11/21/21 0840   MEDLINE mouth rinse, 15 mL, Mouth Rinse, BID, Cherre Robins, MD, 15 mL at 11/21/21 0844   naloxone [facility]) injection 0.4 mg, 0.4 mg, Intravenous, PRN **AND** sodium chloride flush (NS) 0.9 % injection 9 mL, 9 mL, Intravenous, PRN, Noemi Chapel P, DO   naproxen (NAPROSYN) tablet 250 mg, 250 mg, Darlene, BID WC, Cherre Robins, MD, 250 mg at 11/21/21 0619   ondansetron [facility]) injection 4 mg, 4 mg, Intravenous, Q6H PRN, Noemi Chapel P, DO   oxyCODONE (Oxy IR/ROXICODONE) immediate release tablet 5-10 mg, 5-10 mg, Darlene, Q4H PRN, Cherre Robins, MD, 10 mg at 11/21/21 0840   pantoprazole (PROTONIX) EC tablet 40 mg, 40 mg, Darlene, QHS, Cherre Robins, MD, 40 mg at 11/20/21 2235   polyethylene glycol (MIRALAX / GLYCOLAX) packet 17 g, 17 g, Darlene, Daily, Cherre Robins, MD, 17 g at 11/21/21 0843   prochlorperazine (COMPAZINE) injection 10 mg, 10 mg, Intravenous, Q6H PRN, Laqueta Jean, MD, 10 mg at 11/06/21 2110   revefenacin (YUPELRI) nebulizer solution 175 mcg, 175 mcg, Nebulization, Daily, Julian Hy, DO, 175 mcg at 11/21/21 0919   sodium chloride flush (NS) 0.9 % injection  10-40 mL, 10-40 mL, Intracatheter, Q12H, Cherre Robins, MD, 10 mL at 11/21/21 0844   sodium chloride flush (NS) 0.9 % injection 10-40 mL, 10-40 mL, Intracatheter, PRN, Cherre Robins, MD, 10 mL at 11/18/21 1145  Patients Current Diet:  Diet Order             Diet Heart Room service appropriate? Yes; Fluid consistency: Thin  Diet effective now                   Precautions / Restrictions Precautions Precautions: Fall, Other (comment) Precaution Comments: Keep SBP <160 Restrictions Weight Bearing Restrictions: No   Has the patient had 2 or more falls or a fall with injury in the past year? Yes  Prior Activity Level Limited Community (1-2x/wk): occasionally using a rollator but otherwise no DME, working FT remotely in customer service, driving  Prior Functional Level Self Care: Did the patient need help bathing, dressing, using the toilet or eating? Independent  Indoor Mobility: Did the patient need assistance with walking from room to room (with or without device)? Independent  Stairs: Did the patient need assistance with internal or external stairs (with or without device)? Independent  Functional Cognition: Did the patient need help planning regular tasks such as shopping or remembering to take medications? Independent  Patient Information Are you of Hispanic, Latino/a,or Spanish origin?: A. No, not of Hispanic, Latino/a, or Spanish origin What is your race?: B. Black or African American Do you need or want an interpreter to communicate with a doctor or health care staff?: 0. No  Patient's Response To:  Health Literacy and Transportation Is the patient able to respond to health literacy and transportation needs?: Yes Health Literacy - How often do you need to have someone help you when you read instructions, pamphlets, or other written material from your doctor or pharmacy?: Never In the past 12 months, has lack of transportation kept you from medical appointments  or from getting medications?: Yes In the past 12 months, has lack of transportation kept you from meetings, work, or from getting things needed for daily living?: No  Development worker, international aid / [place] Devices/Equipment:  Walker (specify type) (Intermittently uses rollator after recieving steroid hip injections.) Home Equipment: BSC/3in1  Prior Device Use: Indicate devices/aids used by the patient prior to current illness, exacerbation or injury?  Rollator occasionally  Current Functional Level Cognition  Overall Cognitive Status: Impaired/Different from baseline Current Attention Level: Sustained Orientation Level: Oriented to person, Oriented to place, Disoriented to time, Disoriented to situation Following Commands: Follows one step commands with increased time Safety/Judgement: Decreased awareness of safety, Decreased awareness of deficits General Comments: Pt with decreased short term and working memory, requires step by step cues and repetition for motor tasks. often asking what happened to her    Extremity Assessment (includes Sensation/Coordination)  Upper Extremity Assessment: Generalized weakness RUE Deficits / Details: aline with block limiting hand arm LUE Deficits / Details: reports discomfort in shoulder. pt tolerates positioning on pillow but very sensitive to tactile input  Lower Extremity Assessment: Generalized weakness RLE Deficits / Details: MMT score of 4 knee extension, can flex knee and hip in bed against gravity with minA; detects touch at feet RLE Sensation: WNL RLE Coordination: decreased gross motor LLE Deficits / Details: MMT score of 3+ knee extension (limited by pain), can flex knee and hip in bed against gravity with minA; detects touch at feet LLE Sensation: WNL LLE Coordination: decreased gross motor    ADLs  Overall ADL's : Needs assistance/impaired Eating/Feeding: Set up Grooming: Applying deodorant Upper Body Bathing: Minimal  assistance Lower Body Bathing: Moderate assistance Upper Body Dressing : Minimal assistance Lower Body Dressing: Moderate assistance Toilet Transfer: Moderate assistance Toileting- Clothing Manipulation and Hygiene: Maximal assistance General ADL Comments: OOB to chair this session. pt immediately expressed back feeling better. pt falling asleep shortly after repositioning    Mobility  Overal bed mobility: Needs Assistance Bed Mobility: Sit to Supine Rolling: Supervision Supine to sit: Min assist Sit to supine: Mod assist General bed mobility comments: BLE assist back into bed    Transfers  Overall transfer level: Needs assistance Equipment used: Rolling walker (2 wheels) Transfers: Sit to/from Stand, Bed to chair/wheelchair/BSC Sit to Stand: Min assist Bed to/from chair/wheelchair/BSC transfer type:: Stand pivot Stand pivot transfers: Min assist Step pivot transfers: Min assist Transfer via Lift Equipment: [place] transfer comment: Pt utilizing wide BOS, minA to rise and steady and pivot towards left to bed    Ambulation / Gait / Stairs / Wheelchair Mobility  Ambulation/Gait Ambulation/Gait assistance: Min assist, Min guard, +2 safety/equipment Gait Distance (Feet): 45 Feet Assistive device: Rolling walker (2 wheels) Gait Pattern/deviations: Step-through pattern, Decreased stride length, Decreased step length - left, Decreased step length - right, Trunk flexed General Gait Details: Slow, mildly unsteady gait with decreased step lengths bilaterally and constant cues to use hands on RW and not lean on forearms. VSS on RA. Gait velocity: very slow Gait velocity interpretation: <1.31 ft/sec, indicative of household ambulator    Posture / Balance Dynamic Sitting Balance Sitting balance - Comments: deferred Balance Overall balance assessment: Needs assistance Sitting-balance support: Feet supported Sitting balance-Leahy Scale: Fair Sitting balance - Comments:  deferred Standing balance support: Bilateral upper extremity supported Standing balance-Leahy Scale: Poor Standing balance comment: Min A for transfer and UE support for walking.    Special needs/care consideration Diabetic management yes   Previous Home Environment (from acute therapy documentation) Living Arrangements: Alone Available Help at Discharge: Family, Available 24 hours/day Type of Home: House Home Layout: One level Home Access: Stairs to enter Entrance Stairs-Rails: None Entrance Stairs-Number of Steps: 2 Bathroom Shower/Tub: Tub/shower  unit Bathroom Toilet: Standard Home Care Services: No  Discharge Living Setting Plans for Discharge Living Setting: Patient's home (family to stay with her at d/c) Type of Home at Discharge: House Discharge Home Layout: One level Discharge Home Access: Stairs to enter Entrance Stairs-Rails: None Entrance Stairs-Number of Steps: 2 Discharge Bathroom Shower/Tub: Tub/shower unit Discharge Bathroom Toilet: Standard Discharge Bathroom Accessibility: Yes How Accessible: Accessible via walker Does the patient have any problems obtaining your medications?: No  Social/Family/Support Systems Anticipated Caregiver: children will rotate, daughter [name] is main contact Anticipated Caregiver's Contact Information: [name] [phone] Ability/Limitations of Caregiver: n/a Caregiver Availability: 24/7 Discharge Plan Discussed with Primary Caregiver: Yes Is Caregiver In Agreement with Plan?: Yes Does Caregiver/Family have Issues with Lodging/Transportation while Pt is in Rehab?: No  Goals Patient/Family Goal for Rehab: PT/OT supervision to mod I, SLP n/a Expected length of stay: 12-14 days Additional Information: difficulty with pain control in the last few days Pt/Family Agrees to Admission and willing to participate: Yes Program Orientation Provided & Reviewed with Pt/Caregiver Including Roles  & Responsibilities: Yes  Barriers to  Discharge: Insurance for SNF coverage  Decrease burden of Care through IP rehab admission: n/a  Possible need for SNF placement upon discharge: Not anticipated.  Pt with good family support and making good progress, despite pain.    Patient Condition: I have reviewed medical records from [facility], spoken with  [facility] team , and patient and daughter. I met with patient at the bedside and discussed via phone for inpatient rehabilitation assessment.  Patient will benefit from ongoing PT and OT, can actively participate in 3 hours of therapy a day 5 days of the week, and can make measurable gains during the admission.  Patient will also benefit from the coordinated team approach during an Inpatient Acute Rehabilitation admission.  The patient will receive intensive therapy as well as Rehabilitation physician, nursing, social worker, and care management interventions.  Due to bladder management, bowel management, safety, skin/wound care, disease management, medication administration, pain management, and patient education the patient requires 24 hour a day rehabilitation nursing.  The patient is currently min to mod assist with mobility and basic ADLs.  Discharge setting and therapy post discharge at home with home health is anticipated.  Patient has agreed to participate in the Acute Inpatient Rehabilitation Program and will admit today.  Preadmission Screen Completed By:  Michel Santee, PT, DPT 11/21/2021 11:15 AM ______________________________________________________________________   Discussed status with Dr. Dagoberto Ligas on 11/21/21  at 11:15 AM  and received approval for admission today.  Admission Coordinator:  Michel Santee, PT, DPT time 11:15 AM Sudie Grumbling 11/21/21    Assessment/Plan: Diagnosis: Does the need for close, 24 hr/day Medical supervision in concert with the patient's rehab needs make it unreasonable for this patient to be served in a less intensive setting? Yes Co-Morbidities requiring  supervision/potential complications: aortic dissection, s/p repair and iliac rupture s/p repair; s/p femoral bypass; chest pain due to CPR; HTN, goiter;  Due to bladder management, bowel management, safety, skin/wound care, disease management, medication administration, pain management, and patient education, does the patient require 24 hr/day rehab nursing? Yes Does the patient require coordinated care of a physician, rehab nurse, PT, OT, and SLP to address physical and functional deficits in the context of the above medical diagnosis(es)? Yes Addressing deficits in the following areas: balance, endurance, locomotion, strength, transferring, bowel/bladder control, bathing, dressing, feeding, grooming, toileting, and cognition Can the patient actively participate in an intensive therapy program of at  least 3 hrs of therapy 5 days a week? Yes The potential for patient to make measurable gains while on inpatient rehab is good Anticipated functional outcomes upon discharge from inpatient rehab: modified independent and supervision PT, modified independent and supervision OT, n/a SLP Estimated rehab length of stay to reach the above functional goals is: 12-14 days Anticipated discharge destination: Home 10. Overall Rehab/Functional Prognosis: good   MD Signature:

## 2021-11-20 NOTE — Progress Notes (Signed)
Physical Therapy Treatment Patient Details Name: Darlene Griffith MRN: [id number] DOB: 1950-11-30 Today's Date: 11/20/2021   History of Present Illness Pt is a 71 y.o. female who presented 11/06/21 with acute onset CP. EKG w/ sinus rhythm. Troponin and BNP WNL. CTA shows type B aortic dissection that extends into the infrarenal abdominal aorta and right common iliac artery. S/p thoracic branch device TEVAR complicated by iliac rupture requiring emergent left common iliac - common femoral bypass 1/23. ETT 1/23 - 1/24. PMH: HTN, hepatic cyst, hyperthyroidism, multinodular goiter, pulmonary and colonic polyps, splenic cyst    PT Comments    Pt received in chair; displays anxious behaviors due to cognitive deficits. Requiring min-mod assist for functional mobility. Transferred from bed to chair with walker. Further treatment limited due to transport arriving to take pt for CT head. D/c plan remains appropriate.   Recommendations for follow up therapy are one component of a multi-disciplinary discharge planning process, led by the attending physician.  Recommendations may be updated based on patient status, additional functional criteria and insurance authorization.  Follow Up Recommendations  Acute inpatient rehab (3hours/day)     Assistance Recommended at Discharge Frequent or constant Supervision/Assistance  Patient can return home with the following A little help with walking and/or transfers;A little help with bathing/dressing/bathroom;Help with stairs or ramp for entrance;Assist for transportation;Assistance with cooking/housework   Equipment Recommendations  Rolling walker (2 wheels)    Recommendations for Other Services       Precautions / Restrictions Precautions Precautions: Fall;Other (comment) Precaution Comments: Keep SBP <160 Restrictions Weight Bearing Restrictions: No     Mobility  Bed Mobility Overal bed mobility: Needs Assistance Bed Mobility: Sit to Supine        Sit to supine: Mod assist   General bed mobility comments: BLE assist back into bed    Transfers Overall transfer level: Needs assistance Equipment used: Rolling walker (2 wheels) Transfers: Sit to/from Stand, Bed to chair/wheelchair/BSC Sit to Stand: Min assist Stand pivot transfers: Min assist         General transfer comment: Pt utilizing wide BOS, minA to rise and steady and pivot towards left to bed    Ambulation/Gait                   Stairs             Wheelchair Mobility    Modified Rankin (Stroke Patients Only)       Balance Overall balance assessment: Needs assistance Sitting-balance support: Feet supported Sitting balance-Leahy Scale: Fair     Standing balance support: Bilateral upper extremity supported Standing balance-Leahy Scale: Poor                              Cognition Arousal/Alertness: Awake/alert Behavior During Therapy: Anxious Overall Cognitive Status: Impaired/Different from baseline Area of Impairment: Orientation, Attention, Memory, Following commands, Safety/judgement, Awareness, Problem solving                 Orientation Level: Disoriented to, Place, Time, Situation Current Attention Level: Sustained Memory: Decreased recall of precautions, Decreased short-term memory Following Commands: Follows one step commands with increased time Safety/Judgement: Decreased awareness of safety, Decreased awareness of deficits Awareness: Intellectual Problem Solving: Slow processing, Difficulty sequencing General Comments: Pt with decreased short term and working memory, requires step by step cues and repetition for motor tasks. often asking what happened to her        Exercises  General Comments        Pertinent Vitals/Pain Pain Assessment Pain Assessment: Faces Faces Pain Scale: Hurts even more Pain Location: back/L side Pain Descriptors / Indicators: Discomfort Pain Intervention(s): Limited  activity within patient's tolerance, Monitored during session    Home Living                          Prior Function            PT Goals (current goals can now be found in the care plan section) Acute Rehab PT Goals Potential to Achieve Goals: Good    Frequency    Min 3X/week      PT Plan Current plan remains appropriate    Co-evaluation              AM-PAC PT "6 Clicks" Mobility   Outcome Measure  Help needed turning from your back to your side while in a flat bed without using bedrails?: A Little Help needed moving from lying on your back to sitting on the side of a flat bed without using bedrails?: A Little Help needed moving to and from a bed to a chair (including a wheelchair)?: A Lot Help needed standing up from a chair using your arms (e.g., wheelchair or bedside chair)?: A Little Help needed to walk in hospital room?: A Little Help needed climbing 3-5 steps with a railing? : A Lot 6 Click Score: 16    End of Session   Activity Tolerance: Patient tolerated treatment well Patient left: in bed;with call bell/phone within reach Nurse Communication: Mobility status PT Visit Diagnosis: Muscle weakness (generalized) (M62.81);History of falling (Z91.81);Difficulty in walking, not elsewhere classified (R26.2);Pain;Unsteadiness on feet (R26.81);Other abnormalities of gait and mobility (R26.89) Pain - Right/Left: Left Pain - part of body: Arm     Time: 4562-5638 PT Time Calculation (min) (ACUTE ONLY): 12 min  Charges:  $Therapeutic Activity: 8-22 mins                     Wyona Almas, PT, DPT Acute Rehabilitation Services Pager [phone] Office [phone]    Deno Etienne 11/20/2021, 4:04 PM

## 2021-11-20 NOTE — Progress Notes (Signed)
Mobility Specialist Progress Note    11/20/21 1114  Mobility  Activity Refused mobility   Pt c/o 9/10 pain in her 'leg crease' radiating around her back.   [facility] Mobility Specialist  M.S. 2C and 6E: [phone] M.S. 4E: [phone]

## 2021-11-20 NOTE — Significant Event (Addendum)
Rapid Response Event Note   Reason for Call :  Altered mental status   Initial Focused Assessment:  Pt sitting in chair. She is alert, oriented to person. Disoriented to time, situation, and place. She does not remember why she is in the hospital or the surgery she had. She also doesn't remember that she is expecting a grandchild in April. Full NIH performed and documented in flow sheet with no focal deficit noted.  Pt in no distress, although tearful as she is concerned she is "loosing her mind".  Today she was started on Gabapentin and Robaxin, in addition to Oxycodone, to help control her pain.  Skin is warm, dry, pink.  Pt with appetite and is eating lunch.   VS: T 97.62F, BP 141/69, HR 64, RR 14, SpO2 94% on room air CBG: 105  Interventions:  -No intervention from RRT  Plan of Care:  -CT head -Delirium precautions: reorient patient, fall precautions, encourage normal sleep/wake pattern, ambulate and up in the chair during the day  Call rapid response for additional needs  Event Summary:  MD Notified: Dr. Stanford Breed Call Time: [place] Time: 1225 End Time: [place], RN

## 2021-11-20 NOTE — Progress Notes (Signed)
Daughter expressed concerns about pt being "out of it" and pt not knowing where she was. Daughter said "something is wrong" and said the pt was not like that earlier. RN notes change as well. Pt had pain meds this am and started Robaxin and Gabapentin as well. PT's daughter said that pt is prescribed Gabapentin but does not always take her meds at home. RN contacted rapid response RN who came to bedside immediately. RN paged MD who was in surgery but said he would come to bedside as soon as he was finished. RN continuing to monitor.  MD to bedside. Pt will go to CT. RN continuing to monitor

## 2021-11-21 ENCOUNTER — Other Ambulatory Visit: Payer: Self-pay

## 2021-11-21 ENCOUNTER — Inpatient Hospital Stay (HOSPITAL_COMMUNITY)
Admission: RE | Admit: 2021-11-21 | Discharge: 2021-12-03 | DRG: 945 | Disposition: A | Discharge status: Home Health | Payer: BC Managed Care – PPO | Source: Intra-hospital | Attending: Physical Medicine and Rehabilitation | Admitting: Physical Medicine and Rehabilitation

## 2021-11-21 ENCOUNTER — Encounter (HOSPITAL_COMMUNITY): Payer: Self-pay | Admitting: Physical Medicine and Rehabilitation

## 2021-11-21 DIAGNOSIS — Z8679 Personal history of other diseases of the circulatory system: Secondary | ICD-10-CM | POA: Diagnosis not present

## 2021-11-21 DIAGNOSIS — R918 Other nonspecific abnormal finding of lung field: Secondary | ICD-10-CM | POA: Diagnosis present

## 2021-11-21 DIAGNOSIS — Z83438 Family history of other disorder of lipoprotein metabolism and other lipidemia: Secondary | ICD-10-CM | POA: Diagnosis not present

## 2021-11-21 DIAGNOSIS — J449 Chronic obstructive pulmonary disease, unspecified: Secondary | ICD-10-CM | POA: Diagnosis present

## 2021-11-21 DIAGNOSIS — I71019 Dissection of thoracic aorta, unspecified: Secondary | ICD-10-CM

## 2021-11-21 DIAGNOSIS — R109 Unspecified abdominal pain: Secondary | ICD-10-CM | POA: Diagnosis not present

## 2021-11-21 DIAGNOSIS — E785 Hyperlipidemia, unspecified: Secondary | ICD-10-CM | POA: Diagnosis not present

## 2021-11-21 DIAGNOSIS — Z8249 Family history of ischemic heart disease and other diseases of the circulatory system: Secondary | ICD-10-CM

## 2021-11-21 DIAGNOSIS — K5730 Diverticulosis of large intestine without perforation or abscess without bleeding: Secondary | ICD-10-CM | POA: Diagnosis not present

## 2021-11-21 DIAGNOSIS — T82858A Stenosis of vascular prosthetic devices, implants and grafts, initial encounter: Secondary | ICD-10-CM | POA: Diagnosis not present

## 2021-11-21 DIAGNOSIS — F17210 Nicotine dependence, cigarettes, uncomplicated: Secondary | ICD-10-CM | POA: Diagnosis not present

## 2021-11-21 DIAGNOSIS — K5900 Constipation, unspecified: Secondary | ICD-10-CM | POA: Diagnosis present

## 2021-11-21 DIAGNOSIS — D72829 Elevated white blood cell count, unspecified: Secondary | ICD-10-CM | POA: Diagnosis present

## 2021-11-21 DIAGNOSIS — K219 Gastro-esophageal reflux disease without esophagitis: Secondary | ICD-10-CM | POA: Diagnosis not present

## 2021-11-21 DIAGNOSIS — R5381 Other malaise: Principal | ICD-10-CM | POA: Diagnosis present

## 2021-11-21 DIAGNOSIS — E0520 Thyrotoxicosis with toxic multinodular goiter without thyrotoxic crisis or storm: Secondary | ICD-10-CM | POA: Diagnosis present

## 2021-11-21 DIAGNOSIS — E669 Obesity, unspecified: Secondary | ICD-10-CM | POA: Diagnosis not present

## 2021-11-21 DIAGNOSIS — Z8674 Personal history of sudden cardiac arrest: Secondary | ICD-10-CM | POA: Diagnosis not present

## 2021-11-21 DIAGNOSIS — Z91013 Allergy to seafood: Secondary | ICD-10-CM

## 2021-11-21 DIAGNOSIS — D62 Acute posthemorrhagic anemia: Secondary | ICD-10-CM | POA: Diagnosis not present

## 2021-11-21 DIAGNOSIS — I493 Ventricular premature depolarization: Secondary | ICD-10-CM | POA: Diagnosis not present

## 2021-11-21 DIAGNOSIS — I959 Hypotension, unspecified: Secondary | ICD-10-CM | POA: Diagnosis not present

## 2021-11-21 DIAGNOSIS — Z8349 Family history of other endocrine, nutritional and metabolic diseases: Secondary | ICD-10-CM

## 2021-11-21 DIAGNOSIS — R296 Repeated falls: Secondary | ICD-10-CM | POA: Diagnosis present

## 2021-11-21 DIAGNOSIS — Z6828 Body mass index (BMI) 28.0-28.9, adult: Secondary | ICD-10-CM

## 2021-11-21 DIAGNOSIS — G40909 Epilepsy, unspecified, not intractable, without status epilepticus: Secondary | ICD-10-CM | POA: Diagnosis present

## 2021-11-21 DIAGNOSIS — Z86010 Personal history of colonic polyps: Secondary | ICD-10-CM | POA: Diagnosis not present

## 2021-11-21 DIAGNOSIS — M549 Dorsalgia, unspecified: Secondary | ICD-10-CM | POA: Diagnosis present

## 2021-11-21 DIAGNOSIS — R4189 Other symptoms and signs involving cognitive functions and awareness: Secondary | ICD-10-CM | POA: Diagnosis not present

## 2021-11-21 DIAGNOSIS — I7411 Embolism and thrombosis of thoracic aorta: Secondary | ICD-10-CM | POA: Diagnosis not present

## 2021-11-21 DIAGNOSIS — R4182 Altered mental status, unspecified: Secondary | ICD-10-CM | POA: Diagnosis not present

## 2021-11-21 DIAGNOSIS — R110 Nausea: Secondary | ICD-10-CM | POA: Diagnosis not present

## 2021-11-21 DIAGNOSIS — I771 Stricture of artery: Secondary | ICD-10-CM | POA: Diagnosis not present

## 2021-11-21 DIAGNOSIS — I7100 Dissection of unspecified site of aorta: Secondary | ICD-10-CM | POA: Diagnosis present

## 2021-11-21 DIAGNOSIS — I10 Essential (primary) hypertension: Secondary | ICD-10-CM | POA: Diagnosis present

## 2021-11-21 DIAGNOSIS — R079 Chest pain, unspecified: Secondary | ICD-10-CM | POA: Diagnosis not present

## 2021-11-21 DIAGNOSIS — J90 Pleural effusion, not elsewhere classified: Secondary | ICD-10-CM | POA: Diagnosis not present

## 2021-11-21 DIAGNOSIS — Z79899 Other long term (current) drug therapy: Secondary | ICD-10-CM

## 2021-11-21 DIAGNOSIS — Z801 Family history of malignant neoplasm of trachea, bronchus and lung: Secondary | ICD-10-CM | POA: Diagnosis not present

## 2021-11-21 DIAGNOSIS — N3281 Overactive bladder: Secondary | ICD-10-CM | POA: Diagnosis present

## 2021-11-21 DIAGNOSIS — G8918 Other acute postprocedural pain: Secondary | ICD-10-CM | POA: Diagnosis not present

## 2021-11-21 LAB — GLUCOSE, CAPILLARY
Glucose-Capillary: 91 mg/dL (ref 70–99)
Glucose-Capillary: 119 mg/dL — ABNORMAL HIGH (ref 70–99)
Glucose-Capillary: 89 mg/dL (ref 70–99)

## 2021-11-21 MED ORDER — HEPARIN SODIUM (PORCINE) 5000 UNIT/ML IJ SOLN: 5000.0000 [IU] | Freq: Three times a day (TID) | INTRAMUSCULAR | Status: DC

## 2021-11-21 MED ORDER — SORBITOL 70 % SOLN
30.0000 mL | Freq: Every day | Status: DC | PRN
  Administered 2021-11-21 – 2021-12-02 (×2): 30 mL via ORAL
  Filled 2021-11-21 (×2): qty 30

## 2021-11-21 MED ORDER — PROCHLORPERAZINE 25 MG RE SUPP: 12.5000 mg | Freq: Four times a day (QID) | RECTAL | Status: DC | PRN

## 2021-11-21 MED ORDER — REVEFENACIN 175 MCG/3ML IN SOLN
175.0000 ug | Freq: Every day | RESPIRATORY_TRACT | Status: DC
  Administered 2021-11-24 – 2021-12-01 (×5): 175 ug via RESPIRATORY_TRACT
  Filled 2021-11-21 (×14): qty 3

## 2021-11-21 MED ORDER — PROCHLORPERAZINE EDISYLATE 10 MG/2ML IJ SOLN: 5.0000 mg | Freq: Four times a day (QID) | INTRAMUSCULAR | Status: DC | PRN

## 2021-11-21 MED ORDER — GUAIFENESIN-DM 100-10 MG/5ML PO SYRP: 5.0000 mL | ORAL_SOLUTION | Freq: Four times a day (QID) | ORAL | Status: DC | PRN

## 2021-11-21 MED ORDER — CHLORHEXIDINE GLUCONATE CLOTH 2 % EX PADS: 6.0000 | MEDICATED_PAD | Freq: Every day | CUTANEOUS | Status: DC

## 2021-11-21 MED ORDER — ACETAMINOPHEN 325 MG PO TABS
325.0000 mg | ORAL_TABLET | ORAL | Status: DC | PRN
  Administered 2021-11-21 – 2021-11-26 (×14): 650 mg via ORAL
  Administered 2021-11-26: 325 mg via ORAL
  Administered 2021-11-26 – 2021-11-30 (×14): 650 mg via ORAL
  Administered 2021-12-01: 325 mg via ORAL
  Administered 2021-12-01 – 2021-12-03 (×5): 650 mg via ORAL
  Filled 2021-11-21 (×37): qty 2

## 2021-11-21 MED ORDER — CARVEDILOL 25 MG PO TABS
25.0000 mg | ORAL_TABLET | Freq: Two times a day (BID) | ORAL | Status: DC
  Administered 2021-11-21 – 2021-12-03 (×22): 25 mg via ORAL
  Filled 2021-11-21 (×22): qty 1

## 2021-11-21 MED ORDER — HEPARIN SODIUM (PORCINE) 5000 UNIT/ML IJ SOLN
5000.0000 [IU] | Freq: Three times a day (TID) | INTRAMUSCULAR | Status: DC
  Administered 2021-11-21 – 2021-11-25 (×12): 5000 [IU] via SUBCUTANEOUS
  Filled 2021-11-21 (×12): qty 1

## 2021-11-21 MED ORDER — OXYCODONE HCL 5 MG PO TABS
5.0000 mg | ORAL_TABLET | ORAL | Status: DC | PRN
  Administered 2021-11-21: 10 mg via ORAL
  Administered 2021-11-21: 5 mg via ORAL
  Administered 2021-11-22 – 2021-11-24 (×11): 10 mg via ORAL
  Administered 2021-11-25: 5 mg via ORAL
  Administered 2021-11-25 – 2021-11-26 (×6): 10 mg via ORAL
  Administered 2021-11-26: 5 mg via ORAL
  Administered 2021-11-27 – 2021-12-02 (×18): 10 mg via ORAL
  Filled 2021-11-21 (×46): qty 2

## 2021-11-21 MED ORDER — IPRATROPIUM-ALBUTEROL 0.5-2.5 (3) MG/3ML IN SOLN: 3.0000 mL | Freq: Four times a day (QID) | RESPIRATORY_TRACT | Status: DC | PRN

## 2021-11-21 MED ORDER — TRAZODONE HCL 50 MG PO TABS
25.0000 mg | ORAL_TABLET | Freq: Every evening | ORAL | Status: DC | PRN
  Administered 2021-11-21 – 2021-12-03 (×7): 50 mg via ORAL
  Filled 2021-11-21 (×8): qty 1

## 2021-11-21 MED ORDER — LOSARTAN POTASSIUM 50 MG PO TABS
25.0000 mg | ORAL_TABLET | Freq: Every day | ORAL | Status: DC
  Administered 2021-11-22 – 2021-12-03 (×12): 25 mg via ORAL
  Filled 2021-11-21 (×12): qty 1

## 2021-11-21 MED ORDER — ATORVASTATIN CALCIUM 40 MG PO TABS
40.0000 mg | ORAL_TABLET | Freq: Every day | ORAL | Status: DC
  Administered 2021-11-22 – 2021-12-03 (×12): 40 mg via ORAL
  Filled 2021-11-21 (×12): qty 1

## 2021-11-21 MED ORDER — PROCHLORPERAZINE MALEATE 5 MG PO TABS
5.0000 mg | ORAL_TABLET | Freq: Four times a day (QID) | ORAL | Status: DC | PRN
  Administered 2021-11-23 – 2021-12-01 (×5): 10 mg via ORAL
  Filled 2021-11-21 (×6): qty 2

## 2021-11-21 MED ORDER — ALUM & MAG HYDROXIDE-SIMETH 200-200-20 MG/5ML PO SUSP
30.0000 mL | ORAL | Status: DC | PRN
Start: 2021-11-21 — End: 2021-12-03

## 2021-11-21 MED ORDER — METAXALONE 800 MG PO TABS
400.0000 mg | ORAL_TABLET | Freq: Three times a day (TID) | ORAL | Status: DC | PRN
  Administered 2021-11-22 – 2021-11-28 (×11): 400 mg via ORAL
  Filled 2021-11-21 (×5): qty 0.5
  Filled 2021-11-21: qty 1
  Filled 2021-11-21 (×5): qty 0.5
  Filled 2021-11-21: qty 1
  Filled 2021-11-21 (×3): qty 0.5

## 2021-11-21 MED ORDER — PANTOPRAZOLE SODIUM 40 MG PO TBEC
40.0000 mg | DELAYED_RELEASE_TABLET | Freq: Every day | ORAL | Status: DC
  Administered 2021-11-21 – 2021-12-02 (×12): 40 mg via ORAL
  Filled 2021-11-21 (×11): qty 1

## 2021-11-21 MED ORDER — POLYETHYLENE GLYCOL 3350 17 G PO PACK
17.0000 g | PACK | Freq: Every day | ORAL | Status: DC | PRN
  Administered 2021-11-21: 17 g via ORAL
  Filled 2021-11-21 (×2): qty 1

## 2021-11-21 MED ORDER — AMLODIPINE BESYLATE 10 MG PO TABS
10.0000 mg | ORAL_TABLET | Freq: Every day | ORAL | Status: DC
  Administered 2021-11-22 – 2021-11-26 (×5): 10 mg via ORAL
  Filled 2021-11-21 (×5): qty 1

## 2021-11-21 MED ORDER — FLEET ENEMA 7-19 GM/118ML RE ENEM: 1.0000 | ENEMA | Freq: Once | RECTAL | Status: DC | PRN

## 2021-11-21 MED ORDER — BISACODYL 10 MG RE SUPP
10.0000 mg | Freq: Every day | RECTAL | Status: DC | PRN
  Administered 2021-11-25: 10 mg via RECTAL
  Filled 2021-11-21: qty 1

## 2021-11-21 NOTE — Plan of Care (Signed)

## 2021-11-21 NOTE — Progress Notes (Signed)
Physical Therapy Treatment Patient Details Name: Darlene Griffith MRN: [id number] DOB: 11/26/1950 Today's Date: 11/21/2021   History of Present Illness Pt is a 71 y.o. female who presented 11/06/21 with acute onset CP. EKG w/ sinus rhythm. Troponin and BNP WNL. CTA shows type B aortic dissection that extends into the infrarenal abdominal aorta and right common iliac artery. S/p thoracic branch device TEVAR complicated by iliac rupture requiring emergent left common iliac - common femoral bypass 1/23. ETT 1/23 - 1/24. PMH: HTN, hepatic cyst, hyperthyroidism, multinodular goiter, pulmonary and colonic polyps, splenic cyst    PT Comments    Patient progressing slowly towards PT goals. Continues to be frustrated with her memory issues. Requires encouragement to participate in mobility. Requires Min A to power to standing. Pt very slow with all mobility and needs cues to progress with activity. Tolerated gait training today with Min guard assist and use of RW for support with constant cues to place hands on RW and not lean on it. Limited mainly by pain despite being premedicated. Will follow and progress as tolerated.   Recommendations for follow up therapy are one component of a multi-disciplinary discharge planning process, led by the attending physician.  Recommendations may be updated based on patient status, additional functional criteria and insurance authorization.  Follow Up Recommendations  Acute inpatient rehab (3hours/day)     Assistance Recommended at Discharge Frequent or constant Supervision/Assistance  Patient can return home with the following A little help with walking and/or transfers;A little help with bathing/dressing/bathroom;Help with stairs or ramp for entrance;Assist for transportation;Assistance with cooking/housework   Equipment Recommendations  Rolling walker (2 wheels)    Recommendations for Other Services       Precautions / Restrictions Precautions Precautions:  Fall;Other (comment) Precaution Comments: Keep SBP <160 Restrictions Weight Bearing Restrictions: No     Mobility  Bed Mobility Overal bed mobility: Needs Assistance Bed Mobility: Supine to Sit     Supine to sit: Min assist, HOB elevated     General bed mobility comments: Min A for trunk to get to EOB, step by step cues for sequencing. Increased time.    Transfers Overall transfer level: Needs assistance Equipment used: Rolling walker (2 wheels) Transfers: Sit to/from Stand Sit to Stand: Min assist           General transfer comment: Min A to power to standing, wide BoS and pulling up on RW. Transferred to chair post ambulation.    Ambulation/Gait Ambulation/Gait assistance: Min guard Gait Distance (Feet): 40 Feet Assistive device: Rolling walker (2 wheels) Gait Pattern/deviations: Step-through pattern, Decreased stride length, Decreased step length - left, Decreased step length - right, Trunk flexed Gait velocity: very slow Gait velocity interpretation: <1.31 ft/sec, indicative of household ambulator   General Gait Details: Slow, mildly unsteady gait with decreased step lengths bilaterally and constant cues to use hands on RW and not lean on forearms. VSS on RA.   Stairs             Wheelchair Mobility    Modified Rankin (Stroke Patients Only)       Balance Overall balance assessment: Needs assistance Sitting-balance support: Feet supported, No upper extremity supported Sitting balance-Leahy Scale: Fair     Standing balance support: During functional activity, Reliant on assistive device for balance Standing balance-Leahy Scale: Poor                              Cognition Arousal/Alertness:  Awake/alert Behavior During Therapy: WFL for tasks assessed/performed Overall Cognitive Status: Impaired/Different from baseline Area of Impairment: Orientation, Memory, Following commands, Safety/judgement, Awareness, Problem solving                  Orientation Level: Disoriented to, Time, Situation   Memory: Decreased recall of precautions, Decreased short-term memory Following Commands: Follows one step commands with increased time Safety/Judgement: Decreased awareness of safety, Decreased awareness of deficits Awareness: Intellectual Problem Solving: Slow processing, Difficulty sequencing, Requires verbal cues General Comments: Pt with decreased short term memory, requires step by step cues and repetition for motor tasks. Remembers this therapist with contextual cues. Frustrated her mind doesnt work. Able to state "Feb" "1972"        Exercises      General Comments General comments (skin integrity, edema, etc.): VSS on RA.      Pertinent Vitals/Pain Pain Assessment Pain Assessment: Faces Faces Pain Scale: Hurts whole lot Pain Location: back/L side Pain Descriptors / Indicators: Discomfort, Sore Pain Intervention(s): Premedicated before session, Monitored during session, Repositioned, Limited activity within patient's tolerance    Home Living                          Prior Function            PT Goals (current goals can now be found in the care plan section) Progress towards PT goals: Progressing toward goals    Frequency    Min 3X/week      PT Plan Current plan remains appropriate    Co-evaluation              AM-PAC PT "6 Clicks" Mobility   Outcome Measure  Help needed turning from your back to your side while in a flat bed without using bedrails?: A Little Help needed moving from lying on your back to sitting on the side of a flat bed without using bedrails?: A Little Help needed moving to and from a bed to a chair (including a wheelchair)?: A Little Help needed standing up from a chair using your arms (e.g., wheelchair or bedside chair)?: A Little Help needed to walk in hospital room?: A Little Help needed climbing 3-5 steps with a railing? : A Lot 6 Click Score: 17     End of Session Equipment Utilized During Treatment: Gait belt Activity Tolerance: Patient tolerated treatment well Patient left: in chair;with call bell/phone within reach;Other (comment) (with PA present) Nurse Communication: Mobility status PT Visit Diagnosis: Muscle weakness (generalized) (M62.81);History of falling (Z91.81);Difficulty in walking, not elsewhere classified (R26.2);Pain;Unsteadiness on feet (R26.81);Other abnormalities of gait and mobility (R26.89) Pain - part of body:  (back)     Time: 7124-5809 PT Time Calculation (min) (ACUTE ONLY): 29 min  Charges:  $Gait Training: 23-37 mins                     Marisa Severin, PT, DPT Acute Rehabilitation Services Pager [phone] Office [place] 11/21/2021, 12:18 PM

## 2021-11-21 NOTE — Discharge Summary (Addendum)
[facility] Discharge Summary   Patient ID:  Darlene Griffith MRN: [id number] DOB/AGE: 23-Apr-1951 71 y.o.  Admit date: 11/06/21 Discharge date: 11/21/21 Date of Surgery: 11/11/21 Surgeon: DR. Jamelle Haring  Admission Diagnosis: Aortic dissection [facility]) [I71.00]  Discharge Diagnoses:  Aortic dissection (HCC) [I71.00]  Secondary Diagnoses: Past Medical History:  Diagnosis Date   Chest pain    a. 01/2015 Echo: Nl LV fxn, Gr 1 DD, triv AI, mild MR.   Essential hypertension    Family history of lung cancer    Hepatic cyst    a. noted on CT 01/2015.   Hyperthyroidism    GOING TO [facility] FOR SECOND OPINION   Multinodular goiter    a. 01/2015 CT chest: multinodular goidter w/ substernal extension of the left lobe of the thyroid assoc w/ rightward deviation of tracheal air column.   Neck pain, chronic    Personal history of colonic polyps    Pulmonary nodules    a. 01/2015 CT Chest: RLL ~ 44mm subpleural nodule - rec f/u in 6-12 mos.   Splenic cyst    a. noted on CT 01/2015.      Discharged Condition: stable  HPI:  &71 y/o female presented to the [facility] ED with sever back and chest pain.  CTA demonstrates type B aortic dissection extends into the infrarenal abdominal aorta and right common iliac artery and terminates just beyond the bifurcation of the right common iliac artery.  She was admitted 11/06/21 to ICU for strict blood pressure control, pain management and close observation.  Pending repeat CTA and possible. On examination dated 11/11/21 her BP was difficult to control and she had persistent she was scheduled for urgent TEVAR.  Hospital Course:  Darlene Griffith is a 71 y.o. female is S/P  PROCEDURE:   1) bilateral ultrasound-guided common femoral artery access 2) left brachial artery exposure and percutaneous access 3) thoracic branched endovascular aortic repair (see below for device details) 4) left common and external iliac artery stenting (11 x 50mm VBX x2) 5)  left common iliac to common femoral artery bypass with 8 mm externally supported PTFE intraoperative left external iliac disruption.  Post op she remained intubated.  Keep SBP 100-160. Her blood counts stable. No need for transfusion. Extremities remained well perfused with brisk doppler and palpable radial pulses.  Left groin firm, JP drain in place.    Repeat CTA 11/13/21 was reviewed by Dr. Stanford Breed.  Repair is in good position and has already had a beneficial effect on the dissection. The dissection has nearly resolved in the chest. There is no evidence of retrograde type a dissection.  The left common iliac to common femoral bypass is widely patent.  The origin of the left hypogastric artery appears compromised, but there is brisk flow throughout the pelvis.  Pain management was difficult left LQ without evidence of active blood loss and interfered with mobility.  NG tube and foley were d/c on 11/14/21.  Leukocytosis resolving no evidence of infection this morning and antibiotics have been discontinued initially started for pneumonia noted on postoperative CT.  Non labored breathing noted.  She developed swelling in the right UE DVT study was negative.  Stable disposition with confusion and short term memory loss surrounding surgery.  CTA of head reveal no acute intercranial changes.  She was discharged to [facility] for rehabilitation.    Plan for OP f/u 12/17/21 with Dr. Stanford Breed and repeat CTA chest/abdomin Jerene Pitch.  Consults:  Treatment Team:  Jamelle Haring  N, MD  Significant Diagnostic Studies: CBC Lab Results  Component Value Date   WBC 10.7 (H) 11/22/2021   HGB 9.2 (L) 11/22/2021   HCT 29.0 (L) 11/22/2021   MCV 98.0 11/22/2021   PLT 111 (L) 11/22/2021    BMET    Component Value Date/Time   NA 137 11/22/2021 0639   K 3.8 11/22/2021 0639   CL 101 11/22/2021 0639   CO2 27 11/22/2021 0639   GLUCOSE 90 11/22/2021 0639   BUN 13 11/22/2021 0639   CREATININE 0.47 11/22/2021 0639   CALCIUM  8.6 (L) 11/22/2021 0639   GFRNONAA >60 11/22/2021 0639   GFRAA >60 01/14/2020 1124   COAG Lab Results  Component Value Date   INR 1.1 11/11/2021   INR 1.1 11/11/2021   INR 1.0 11/06/2021     Disposition:  Discharge to :Rehab   Scheduled Meds:  amLODipine  10 mg Oral Daily   vitamin C  1,000 mg Oral Daily   atorvastatin  40 mg Oral Daily   carvedilol  25 mg Oral BID WC   Chlorhexidine Gluconate Cloth  6 each Topical BID   enoxaparin (LOVENOX) injection  40 mg Subcutaneous Daily   losartan  25 mg Oral Daily   [START ON 11/26/2021] oxyCODONE  5 mg Oral Daily   pantoprazole  40 mg Oral QHS   revefenacin  175 mcg Nebulization Daily   senna  2 tablet Oral Daily   sodium chloride flush  10-40 mL Intracatheter Q12H   sorbitol  30 mL Oral Once   Continuous Infusions: PRN Meds:.acetaminophen, alum & mag hydroxide-simeth, bisacodyl, guaiFENesin-dextromethorphan, ipratropium-albuterol, metaxalone, oxyCODONE, polyethylene glycol, prochlorperazine **OR** prochlorperazine **OR** prochlorperazine, sodium phosphate, sorbitol, traZODone  Verbal and written Discharge instructions given to the patient. Wound care per Discharge AVS   Signed: Roxy Horseman 11/25/2021, 9:43 AM

## 2021-11-21 NOTE — Progress Notes (Signed)
Inpatient Rehab Admissions Coordinator:   Per Dr. Stanford Breed ready to admit to CIR today.  Will let pt/family and TOC team know.   Shann Medal, PT, DPT Admissions Coordinator [phone] 11/21/21  12:18 PM

## 2021-11-21 NOTE — Progress Notes (Addendum)
[facility] [place] having issue with short term memory loss and states in pain.    Assessment/Planning:  71 y.o. female is s/p symptomatic type B aortic dissection despite medical management with thoracic branch endovascular aortic repair complicated by iliac rupture. Rupture repaired by common iliac to common femoral bypass 10 Days Post-Op   Continued issues with pain control Incisions healing well without hematoma or ischemic changes Confused about surgery and post op memory loss General No acute distress BP and HR stable no new labs today on home anti hypertensive's DVT prophylaxis SQ heparin CT reveals no acute intercranial changes Encourage mobility  Pending insurance approval for CIR  Objective (!) 152/71 76 97.8 F (36.6 C) (Oral) 14 98%  Intake/Output Summary (Last 24 hours) at 11/21/2021 0743 Last data filed at 11/21/2021 0600 Gross per 24 hour  Intake 70 ml  Output 600 ml  Net -530 ml   Moving all 4 ext. Lungs non labored breathing HR RRR Left LQ/ groin tender to palpation, no ischemic changes to skin.  Incision healing well Left brachial artery site healing well with intact staples and palpable radial pulse        Roxy Horseman 11/21/2021 7:43 AM --  Laboratory Lab Results: Recent Labs    11/19/21 0800  WBC 11.0*  HGB 9.7*  HCT 30.2*  PLT 200   BMET Recent Labs    11/19/21 0800  NA 138  K 3.6  CL 100  CO2 30  GLUCOSE 96  BUN 7*  CREATININE 0.41*  CALCIUM 8.6*    COAG Lab Results  Component Value Date   INR 1.1 11/11/2021   INR 1.1 11/11/2021   INR 1.0 11/06/2021   No results found for: PTT  VASCULAR STAFF ADDENDUM: I have independently interviewed and examined the patient. I agree with the above.  She is ready for the next level of care.  I reviewed her hospital course with her in detail, per her wishes.  She is very appreciative to the entire care team for such excellent  work in taking caring of her.  Plan for transfer to inpatient rehab later today.  [name]. Stanford Breed, MD [facility] Phone Number: [phone] 11/21/2021 12:13 PM

## 2021-11-21 NOTE — Progress Notes (Signed)
Inpatient Rehabilitation  Patient information reviewed and entered into eRehab system by Colbi Staubs M. Makael Stein, M.A., CCC/SLP, PPS Coordinator.  Information including medical coding, functional ability and quality indicators will be reviewed and updated through discharge.    

## 2021-11-21 NOTE — Progress Notes (Signed)
Inpatient Rehabilitation Admission Medication Review by a Pharmacist  A complete drug regimen review was completed for this patient to identify any potential clinically significant medication issues.  High Risk Drug Classes Is patient taking? Indication by Medication  Antipsychotic Yes Compazine- N/V  Anticoagulant Yes Heparin- VTE prophylaxis  Antibiotic No   Opioid Yes OxyIR- acute pain  Antiplatelet No   Hypoglycemics/insulin No   Vasoactive Medication Yes Coreg, Norvasc, Cozaar- Hypertension  Chemotherapy No   Other Yes Lipitor- HLD Protonix- GERD Trazodone- sleep     Type of Medication Issue Identified Description of Issue Recommendation(s)  Drug Interaction(s) (clinically significant)     Duplicate Therapy     Allergy     No Medication Administration End Date     Incorrect Dose     Additional Drug Therapy Needed     Significant med changes from prior encounter (inform family/care partners about these prior to discharge).    Other       Clinically significant medication issues were identified that warrant physician communication and completion of prescribed/recommended actions by midnight of the next day:  No  Time spent performing this drug regimen review (minutes):  30   Jeyren Danowski BS, PharmD, BCPS Clinical Pharmacist 11/21/2021 2:32 PM

## 2021-11-21 NOTE — Progress Notes (Signed)
Pt refused standing weight due to extreme pain. Will get bed weight and pass onto day shift to obtain standing weight.

## 2021-11-21 NOTE — Progress Notes (Signed)
PMR Admission Coordinator Pre-Admission Assessment   Patient: Darlene Griffith is an 71 y.o., female MRN: [id number] DOB: May 25, 1951 Height: $RemoveBefo'5\' 1"'[id number]$  (154.9 cm) Weight: 68.6 kg   Insurance Information HMO:     PPO: yes     PCP:      IPA:      80/20:      OTHER:  PRIMARY: [place]      Policy#: [id number]       Subscriber: pt CM Name: Rex Kras      Phone#: [phone]     Fax#: [phone] Pre-Cert#: [id number] [place] for CIR from [place] with [place] with updates due to fax listed above on 11/27/21    Employer:  Benefits:  Phone #: [phone]     Name:  Eff. Date: 10/20/20     Deduct: $850 ($0)      Out of Pocket Max: $3000 ($304 met)      Life Max: n/a CIR: 80%      SNF: 80% Outpatient:      Co-Pay: $25/visit Home Health: 80%      Co-Ins: 20% DME: 80%     Co-Ins: 20% Providers: n/a SECONDARY: Medicare A/B      Policy#: [id number]      Phone#:    Financial Counselor:       Phone#:    The Actuary for patients in Inpatient Rehabilitation Facilities with attached Privacy Act [place] Records was provided and verbally reviewed with: Patient and Family   Emergency Contact Information Contact Information       Name Relation Home Work Mobile    [name] Daughter     [phone]    [name]     [phone]           Current Medical History  Patient Admitting Diagnosis: aortic dissection    History of Present Illness: Darlene Griffith is a 72 year old female who presented on 11/06/21 with acute onset of the upper thoracic region pain radiating circumferentially around her chest.  Past medical history significant for hypertension on Norvasc and Cozaar. Imaging revealed Type B aortic dissection extending into the infra-renal aorta and right common iliac artery. Vascular consultation obtained by Dr. Stanford Breed. Admitted to CCU by CCM. Esmolol started for hypertension but changed to labetalol for better efficacy. ECG with sinus rhythm and  troponin and BNP were within normal limits.  Follow-up CTA of the chest performed on 1/20 with stable appearance of dissection.  Despite medical management, she continued to have persistent 10 out of 10 mid scapular back pain radiating to her chest and abdomen.  On 1/23 she underwent thoracic branched endovascular aortic repair with a left common and external iliac stenting, left common iliac to common femoral artery bypass with PTFE graft.  This was performed through bilateral common femoral artery access and left brachial artery exposure percutaneous access.  During the procedure, a rapid drop in blood pressure occurred and angiogram confirmed complete disruption of the external iliac artery.  Endovascular repair was attempted and the patient's blood pressure continued further decline and CPR was initiated, which restored a perfusion pressure.  ROSC was achieved within 3 minutes.  Rapid exposure of the infrarenal aorta was achieved to the left retroperitoneal exposure.  A surgical drain was left to the retroperitoneal space.  At completion of the case, Doppler flow was confirmed in the left radial artery and bilateral posterior tibial arteries.  She remained intubated and observed in the ICU.  She required Cardene infusion on  1/20 for hypertension follow-up CTA was performed on 1/24 which revealed possible pneumonia and cefepime initiated.  She was extubated on 1/24.  Post operatively developed an ileus with NG tube initiated, and it was discontinued on 1/28 and she was tolerating small amounts of p.o. intake.  Cefepime was stopped on 1/30.  Her hemoglobin remained stable at 9.2.  Therapy ongoing and recommendations are for CIR.    Patient's medical record from [name] has been reviewed by the rehabilitation admission coordinator and physician.   Past Medical History      Past Medical History:  Diagnosis Date   Chest pain      a. 01/2015 Echo: Nl LV fxn, Gr 1 DD, triv AI, mild MR.   Essential  hypertension     Family history of lung cancer     Hepatic cyst      a. noted on CT 01/2015.   Hyperthyroidism      GOING TO [facility] FOR SECOND OPINION   Multinodular goiter      a. 01/2015 CT chest: multinodular goidter w/ substernal extension of the left lobe of the thyroid assoc w/ rightward deviation of tracheal air column.   Neck pain, chronic     Personal history of colonic polyps     Pulmonary nodules      a. 01/2015 CT Chest: RLL ~ 52mm subpleural nodule - rec f/u in 6-12 mos.   Splenic cyst      a. noted on CT 01/2015.      Has the patient had major surgery during 100 days prior to admission? Yes   Family History   family history includes Cancer in her maternal uncle and sister; Heart attack in her maternal grandmother; High Cholesterol in her mother; High blood pressure in her mother; Lung cancer in her father; Thyroid disease in her mother and sister.   Current Medications   Current Facility-Administered Medications:    acetaminophen (TYLENOL) tablet 650 mg, 650 mg, Oral, Q4H PRN, Mick Sell, PA-C, 650 mg at 11/20/21 1015   amLODipine (NORVASC) tablet 10 mg, 10 mg, Oral, Daily, Cherre Robins, MD, 10 mg at 11/21/21 4497   atorvastatin (LIPITOR) tablet 40 mg, 40 mg, Oral, Daily, Cherre Robins, MD, 40 mg at 11/21/21 5300   bisacodyl (DULCOLAX) suppository 10 mg, 10 mg, Rectal, Daily PRN, Cherre Robins, MD   carvedilol (COREG) tablet 25 mg, 25 mg, Oral, BID WC, Cherre Robins, MD, 25 mg at 11/21/21 5110   Chlorhexidine Gluconate Cloth 2 % PADS 6 each, 6 each, Topical, Daily, Spero Geralds, MD, 6 each at 11/21/21 0844   diphenhydrAMINE (BENADRYL) injection 12.5 mg, 12.5 mg, Intravenous, Q6H PRN, 12.5 mg at 11/15/21 0816 **OR** diphenhydrAMINE (BENADRYL) 12.5 MG/5ML elixir 12.5 mg, 12.5 mg, Oral, Q6H PRN, Noemi Chapel P, DO   docusate sodium (COLACE) capsule 200 mg, 200 mg, Oral, BID, Cherre Robins, MD, 200 mg at 11/21/21 0842   heparin injection 5,000 Units, 5,000  Units, Subcutaneous, Q8H, Cherre Robins, MD, 5,000 Units at 11/21/21 0620   hydrALAZINE (APRESOLINE) injection 20 mg, 20 mg, Intravenous, Q6H PRN, Jacky Kindle, MD, 20 mg at 11/15/21 2000   HYDROmorphone (DILAUDID) injection 0.5 mg, 0.5 mg, Intravenous, Q4H PRN, Cherre Robins, MD, 0.5 mg at 11/20/21 2235   insulin aspart (novoLOG) injection 0-9 Units, 0-9 Units, Subcutaneous, Q4H, Simpson, Paula B, NP, 3 Units at 11/20/21 0908   ipratropium-albuterol (DUONEB) 0.5-2.5 (3) MG/3ML nebulizer solution 3 mL, 3 mL,  Nebulization, Q6H PRN, Cherre Robins, MD   labetalol (NORMODYNE) injection 10-20 mg, 10-20 mg, Intravenous, Q2H PRN, Ogan, Kerry Kass, MD, 20 mg at 11/17/21 0403   lidocaine (LMX) 4 % cream, , Topical, BID PRN, Julian Hy, DO, 1 application at 25/42/70 1932   losartan (COZAAR) tablet 25 mg, 25 mg, Oral, Daily, Jacky Kindle, MD, 25 mg at 11/21/21 0840   MEDLINE mouth rinse, 15 mL, Mouth Rinse, BID, Cherre Robins, MD, 15 mL at 11/21/21 0844   naloxone (NARCAN) injection 0.4 mg, 0.4 mg, Intravenous, PRN **AND** sodium chloride flush (NS) 0.9 % injection 9 mL, 9 mL, Intravenous, PRN, Noemi Chapel P, DO   naproxen (NAPROSYN) tablet 250 mg, 250 mg, Oral, BID WC, Cherre Robins, MD, 250 mg at 11/21/21 0619   ondansetron (ZOFRAN) injection 4 mg, 4 mg, Intravenous, Q6H PRN, Noemi Chapel P, DO   oxyCODONE (Oxy IR/ROXICODONE) immediate release tablet 5-10 mg, 5-10 mg, Oral, Q4H PRN, Cherre Robins, MD, 10 mg at 11/21/21 0840   pantoprazole (PROTONIX) EC tablet 40 mg, 40 mg, Oral, QHS, Cherre Robins, MD, 40 mg at 11/20/21 2235   polyethylene glycol (MIRALAX / GLYCOLAX) packet 17 g, 17 g, Oral, Daily, Cherre Robins, MD, 17 g at 11/21/21 0843   prochlorperazine (COMPAZINE) injection 10 mg, 10 mg, Intravenous, Q6H PRN, Laqueta Jean, MD, 10 mg at 11/06/21 2110   revefenacin (YUPELRI) nebulizer solution 175 mcg, 175 mcg, Nebulization, Daily, Julian Hy, DO, 175 mcg at  11/21/21 0919   sodium chloride flush (NS) 0.9 % injection 10-40 mL, 10-40 mL, Intracatheter, Q12H, Cherre Robins, MD, 10 mL at 11/21/21 0844   sodium chloride flush (NS) 0.9 % injection 10-40 mL, 10-40 mL, Intracatheter, PRN, Cherre Robins, MD, 10 mL at 11/18/21 1145   Patients Current Diet:  Diet Order                  Diet Heart Room service appropriate? Yes; Fluid consistency: Thin  Diet effective now                         Precautions / Restrictions Precautions Precautions: Fall, Other (comment) Precaution Comments: Keep SBP <160 Restrictions Weight Bearing Restrictions: No    Has the patient had 2 or more falls or a fall with injury in the past year? Yes   Prior Activity Level Limited Community (1-2x/wk): occasionally using a rollator but otherwise no DME, working FT remotely in customer service, driving   Prior Functional Level Self Care: Did the patient need help bathing, dressing, using the toilet or eating? Independent   Indoor Mobility: Did the patient need assistance with walking from room to room (with or without device)? Independent   Stairs: Did the patient need assistance with internal or external stairs (with or without device)? Independent   Functional Cognition: Did the patient need help planning regular tasks such as shopping or remembering to take medications? Independent   Patient Information Are you of Hispanic, Latino/a,or Spanish origin?: A. No, not of Hispanic, Latino/a, or Spanish origin What is your race?: B. Black or African American Do you need or want an interpreter to communicate with a doctor or health care staff?: 0. No   Patient's Response To:  Health Literacy and Transportation Is the patient able to respond to health literacy and transportation needs?: Yes Health Literacy - How often do you need to have someone help you when you  read instructions, pamphlets, or other written material from your doctor or pharmacy?: Never In the  past 12 months, has lack of transportation kept you from medical appointments or from getting medications?: Yes In the past 12 months, has lack of transportation kept you from meetings, work, or from getting things needed for daily living?: No   Home Assistive Devices / [place] Devices/Equipment: Environmental consultant (specify type) (Intermittently uses rollator after recieving steroid hip injections.) Home Equipment: BSC/3in1   Prior Device Use: Indicate devices/aids used by the patient prior to current illness, exacerbation or injury?  Rollator occasionally   Current Functional Level Cognition   Overall Cognitive Status: Impaired/Different from baseline Current Attention Level: Sustained Orientation Level: Oriented to person, Oriented to place, Disoriented to time, Disoriented to situation Following Commands: Follows one step commands with increased time Safety/Judgement: Decreased awareness of safety, Decreased awareness of deficits General Comments: Pt with decreased short term and working memory, requires step by step cues and repetition for motor tasks. often asking what happened to her    Extremity Assessment (includes Sensation/Coordination)   Upper Extremity Assessment: Generalized weakness RUE Deficits / Details: aline with block limiting hand arm LUE Deficits / Details: reports discomfort in shoulder. pt tolerates positioning on pillow but very sensitive to tactile input  Lower Extremity Assessment: Generalized weakness RLE Deficits / Details: MMT score of 4 knee extension, can flex knee and hip in bed against gravity with minA; detects touch at feet RLE Sensation: WNL RLE Coordination: decreased gross motor LLE Deficits / Details: MMT score of 3+ knee extension (limited by pain), can flex knee and hip in bed against gravity with minA; detects touch at feet LLE Sensation: WNL LLE Coordination: decreased gross motor     ADLs   Overall ADL's : Needs  assistance/impaired Eating/Feeding: Set up Grooming: Applying deodorant Upper Body Bathing: Minimal assistance Lower Body Bathing: Moderate assistance Upper Body Dressing : Minimal assistance Lower Body Dressing: Moderate assistance Toilet Transfer: Moderate assistance Toileting- Clothing Manipulation and Hygiene: Maximal assistance General ADL Comments: OOB to chair this session. pt immediately expressed back feeling better. pt falling asleep shortly after repositioning     Mobility   Overal bed mobility: Needs Assistance Bed Mobility: Sit to Supine Rolling: Supervision Supine to sit: Min assist Sit to supine: Mod assist General bed mobility comments: BLE assist back into bed     Transfers   Overall transfer level: Needs assistance Equipment used: Rolling walker (2 wheels) Transfers: Sit to/from Stand, Bed to chair/wheelchair/BSC Sit to Stand: Min assist Bed to/from chair/wheelchair/BSC transfer type:: Stand pivot Stand pivot transfers: Min assist Step pivot transfers: Min assist Transfer via Lift Equipment: [place] transfer comment: Pt utilizing wide BOS, minA to rise and steady and pivot towards left to bed     Ambulation / Gait / Stairs / Wheelchair Mobility   Ambulation/Gait Ambulation/Gait assistance: Min assist, Min guard, +2 safety/equipment Gait Distance (Feet): 45 Feet Assistive device: Rolling walker (2 wheels) Gait Pattern/deviations: Step-through pattern, Decreased stride length, Decreased step length - left, Decreased step length - right, Trunk flexed General Gait Details: Slow, mildly unsteady gait with decreased step lengths bilaterally and constant cues to use hands on RW and not lean on forearms. VSS on RA. Gait velocity: very slow Gait velocity interpretation: <1.31 ft/sec, indicative of household ambulator     Posture / Balance Dynamic Sitting Balance Sitting balance - Comments: deferred Balance Overall balance assessment: Needs  assistance Sitting-balance support: Feet supported Sitting balance-Leahy Scale: Fair Sitting balance -  Comments: deferred Standing balance support: Bilateral upper extremity supported Standing balance-Leahy Scale: Poor Standing balance comment: Min A for transfer and UE support for walking.     Special needs/care consideration Diabetic management yes    Previous Home Environment (from acute therapy documentation) Living Arrangements: Alone Available Help at Discharge: Family, Available 24 hours/day Type of Home: House Home Layout: One level Home Access: Stairs to enter Entrance Stairs-Rails: None Entrance Stairs-Number of Steps: 2 Bathroom Shower/Tub: Chiropodist: [place]: No   Discharge Living Setting Plans for Discharge Living Setting: Patient's home (family to stay with her at d/c) Type of Home at Discharge: House Discharge Home Layout: One level Discharge Home Access: Stairs to enter Entrance Stairs-Rails: None Entrance Stairs-Number of Steps: 2 Discharge Bathroom Shower/Tub: Tub/shower unit Discharge Bathroom Toilet: Standard Discharge Bathroom Accessibility: Yes How Accessible: Accessible via walker Does the patient have any problems obtaining your medications?: No   Social/Family/Support Systems Anticipated Caregiver: children will rotate, daughter [name] is main contact Anticipated Caregiver's Contact Information: [name] [phone] Ability/Limitations of Caregiver: n/a Caregiver Availability: 24/7 Discharge Plan Discussed with Primary Caregiver: Yes Is Caregiver In Agreement with Plan?: Yes Does Caregiver/Family have Issues with Lodging/Transportation while Pt is in Rehab?: No   Goals Patient/Family Goal for Rehab: PT/OT supervision to mod I, SLP n/a Expected length of stay: 12-14 days Additional Information: difficulty with pain control in the last few days Pt/Family Agrees to Admission and willing to participate:  Yes Program Orientation Provided & Reviewed with Pt/Caregiver Including Roles  & Responsibilities: Yes  Barriers to Discharge: Insurance for SNF coverage   Decrease burden of Care through IP rehab admission: n/a   Possible need for SNF placement upon discharge: Not anticipated.  Pt with good family support and making good progress, despite pain.     Patient Condition: I have reviewed medical records from [facility], spoken with  [facility] team , and patient and daughter. I met with patient at the bedside and discussed via phone for inpatient rehabilitation assessment.  Patient will benefit from ongoing PT and OT, can actively participate in 3 hours of therapy a day 5 days of the week, and can make measurable gains during the admission.  Patient will also benefit from the coordinated team approach during an Inpatient Acute Rehabilitation admission.  The patient will receive intensive therapy as well as Rehabilitation physician, nursing, social worker, and care management interventions.  Due to bladder management, bowel management, safety, skin/wound care, disease management, medication administration, pain management, and patient education the patient requires 24 hour a day rehabilitation nursing.  The patient is currently min to mod assist with mobility and basic ADLs.  Discharge setting and therapy post discharge at home with home health is anticipated.  Patient has agreed to participate in the Acute Inpatient Rehabilitation Program and will admit today.   Preadmission Screen Completed By:  Michel Santee, PT, DPT 11/21/2021 11:15 AM ______________________________________________________________________   Discussed status with Dr. Dagoberto Ligas on 11/21/21  at 11:15 AM  and received approval for admission today.   Admission Coordinator:  Michel Santee, PT, DPT time 11:15 AM Sudie Grumbling 11/21/21     Assessment/Plan: Diagnosis: Does the need for close, 24 hr/day Medical supervision in concert with the patient's  rehab needs make it unreasonable for this patient to be served in a less intensive setting? Yes Co-Morbidities requiring supervision/potential complications: aortic dissection, s/p repair and iliac rupture s/p repair; s/p femoral bypass; chest pain due to CPR; HTN, goiter;  Due to bladder management, bowel management, safety, skin/wound care, disease management, medication administration, pain management, and patient education, does the patient require 24 hr/day rehab nursing? Yes Does the patient require coordinated care of a physician, rehab nurse, PT, OT, and SLP to address physical and functional deficits in the context of the above medical diagnosis(es)? Yes Addressing deficits in the following areas: balance, endurance, locomotion, strength, transferring, bowel/bladder control, bathing, dressing, feeding, grooming, toileting, and cognition Can the patient actively participate in an intensive therapy program of at least 3 hrs of therapy 5 days a week? Yes The potential for patient to make measurable gains while on inpatient rehab is good Anticipated functional outcomes upon discharge from inpatient rehab: modified independent and supervision PT, modified independent and supervision OT, n/a SLP Estimated rehab length of stay to reach the above functional goals is: 12-14 days Anticipated discharge destination: Home 10. Overall Rehab/Functional Prognosis: good     MD Signature:

## 2021-11-21 NOTE — Progress Notes (Signed)
Patient arrived to unit and oriented to person and place. Inconsistent in reporting symptoms (location of pain, bowel movements, etc). Verbalized agreement to call for assistance and oriented to unit routines. Will need to reinforce. Resting comfortably with call bell in place.

## 2021-11-21 NOTE — Plan of Care (Signed)

## 2021-11-21 NOTE — Discharge Summary (Deleted)
  The note originally documented on this encounter has been moved the the encounter in which it belongs.  

## 2021-11-21 NOTE — H&P (Addendum)
Physical Medicine and Rehabilitation Admission H&P        Chief Complaint  Patient presents with   GENERALIZED PAIN  Functional deficits/debility secondary to aortic dissection with repair, brief cardiac arrest and acute blood loss anemia:   HPI: Darlene Griffith is a 71 year old female who presented to the emergency department with acute onset of the upper thoracic region pain radiating circumferentially around her chest. This was approximately 1 hour to presentation on 11/06/2021.  Past medical history significant for hypertension on Norvasc and Cozaar. Imaging revealed Type B aortic dissection extending into the infra-renal aorta and right common iliac artery. Vascular consultation obtained by Dr. Stanford Breed. Admitted to CCU by CCM. Esmolol started for hypertension but changed to labetalol for better efficacy. ECG with sinus rhythm and troponin and BNP were within normal limits.  Follow-up CTA of the chest performed on 1/20 with stable appearance of dissection.  Despite medical management, she continued to have persistent 10 out of 10 mid scapular back pain radiating to her chest and abdomen.  On 1/23 she was taken to the operating room and underwent thoracic branched endovascular aortic repair with a left common and external iliac stenting, left common iliac to common femoral artery bypass with PTFE graft.  This was performed through bilateral common femoral artery access and left brachial artery exposure percutaneous access.  During the procedure, a rapid drop in blood pressure occurred and angiogram confirmed complete disruption of the external iliac artery.  Endovascular repair was attempted and the patient's blood pressure continued further decline and CPR was initiated which restored a perfusion pressure.  ROSC was achieved within 3 minutes.  Rapid exposure of the infrarenal aorta was achieved to the left retroperitoneal exposure.  A surgical drain was left to the retroperitoneal space.  At  completion of the case, Doppler flow was confirmed in the left radial artery and bilateral posterior tibial arteries.  She remained intubated and observed in the ICU.  She required Cardene infusion on 1/20 for hypertension follow-up CTA was performed on 1/24 which revealed possible pneumonia and cefepime initiated.  She was extubated on 1/24 and continued observation in the ICU.  Her nasogastric tube was discontinued on 1/28 and she was tolerating small amounts of p.o. intake.  Cefepime was stopped on 1/30.  Her hemoglobin remained stable at 9.2. The patient requires inpatient medicine and rehabilitation evaluations and services for ongoing dysfunction secondary to aortic dissection repair complicated by brief cardiac arrest and acute blood loss anemia, intensive care monitoring.   Other significant medical history includes hyperlipidemia, tobacco abuse, overactive bladder, gastroesophageal reflux disease, COPD, seizure disorder.  Underwent cervical disc arthroplasty in 2020 by Dr. Ellene Route.   Lives alone with excellent family support and very involved with grandchildren. Has one dog at home, Shaniko. She is being cared for presently by one of the children   Review of Systems  Unable to perform ROS: Mental acuity  Cardiovascular:        Reports pleuritic chest pain  Gastrointestinal:  Positive for constipation.  Genitourinary:  Negative for dysuria.  Musculoskeletal:  Positive for back pain.       Left mid-upper back pain>>worse after ambulating, relieved somewhat with pillow support. Anterior chest wall pain resolved. Complains of left axillary pain when elevating arm.  Neurological:  Positive for tingling.       Alert and oriented. Reports new memory issues.  Reports left foot tingling, no pain  Psychiatric/Behavioral:  Positive for memory loss.  Past Medical History:  Diagnosis Date   Chest pain      a. 01/2015 Echo: Nl LV fxn, Gr 1 DD, triv AI, mild MR.   Essential hypertension     Family  history of lung cancer     Hepatic cyst      a. noted on CT 01/2015.   Hyperthyroidism      GOING TO [facility] FOR SECOND OPINION   Multinodular goiter      a. 01/2015 CT chest: multinodular goidter w/ substernal extension of the left lobe of the thyroid assoc w/ rightward deviation of tracheal air column.   Neck pain, chronic     Personal history of colonic polyps     Pulmonary nodules      a. 01/2015 CT Chest: RLL ~ 28mm subpleural nodule - rec f/u in 6-12 mos.   Splenic cyst      a. noted on CT 01/2015.         Past Surgical History:  Procedure Laterality Date   BYPASS GRAFT AORTA TO AORTA Left 11/11/2021    Procedure: BYPASS GRAFT AORTA TO LEFT COMMON  FEMORAL ARTERY;  Surgeon: Cherre Robins, MD;  Location: [place] [facility];  Service: Vascular;  Laterality: Left;   CERVICAL DISC ARTHROPLASTY N/A 07/05/2019    Procedure: Cervical Six-Seven Artificial disc replacement;  Surgeon: Kristeen Miss, MD;  Location: [facility];  Service: Neurosurgery;  Laterality: N/A;  Cervical Six-Seven Artificial disc replacement   CESAREAN SECTION       COLONOSCOPY   01/13/2020   THORACIC AORTIC ENDOVASCULAR STENT GRAFT N/A 11/11/2021    Procedure: THORACIC AORTIC ENDOVASCULAR STENT GRAFT WITH LEFT BRACHIAL  ARTERY ACCESS;  Surgeon: Cherre Robins, MD;  Location: [facility] OR;  Service: Vascular;  Laterality: N/A;   TONSILLECTOMY        AROUND 5-6 YRS OLD   ULTRASOUND GUIDANCE FOR VASCULAR ACCESS Bilateral 11/11/2021    Procedure: ULTRASOUND GUIDANCE FOR VASCULAR ACCESS;  Surgeon: Cherre Robins, MD;  Location: [facility] OR;  Service: Vascular;  Laterality: Bilateral;   UPPER GASTROINTESTINAL ENDOSCOPY   01/13/2020         Family History  Problem Relation Age of Onset   Heart attack Maternal Grandmother          deceased   High blood pressure Mother     High Cholesterol Mother     Thyroid disease Mother     Thyroid disease Sister     Lung cancer Father     Cancer Maternal Uncle          unk type   Cancer Sister          unk  type   Colon cancer Neg Hx     Colon polyps Neg Hx     Esophageal cancer Neg Hx     Rectal cancer Neg Hx     Stomach cancer Neg Hx      Social History:  reports that she has been smoking cigarettes. She has never used smokeless tobacco. She reports current alcohol use. She reports that she does not use drugs. Allergies:      Allergies  Allergen Reactions   Shellfish Allergy Anaphylaxis          Medications Prior to Admission  Medication Sig Dispense Refill   amLODipine (NORVASC) 5 MG tablet Take 1 tablet (5 mg total) by mouth at bedtime. 90 tablet 1   Aspirin-Acetaminophen (GOODYS BACK & BODY PAIN PO) Take 1 Package by mouth daily as  needed (pain).       aspirin-acetaminophen-caffeine (EXCEDRIN MIGRAINE) 250-250-65 MG tablet Take 2 tablets by mouth every 6 (six) hours as needed for headache.       clotrimazole-betamethasone (LOTRISONE) cream Apply to affected area 2 times daily prn 15 g 0   atorvastatin (LIPITOR) 40 MG tablet Take 1 tablet by mouth once daily (Patient not taking: Reported on 11/06/2021) 90 tablet 1   diclofenac Sodium (VOLTAREN) 1 % GEL Apply 4 g topically 4 (four) times daily. (Patient not taking: Reported on 11/06/2021) 100 g 0   losartan (COZAAR) 25 MG tablet Take 1 tablet (25 mg total) by mouth daily. (Patient not taking: Reported on 11/06/2021) 30 tablet 1   MYRBETRIQ 25 MG TB24 tablet Take 1 tablet by mouth once daily (Patient not taking: Reported on 11/06/2021) 30 tablet 0   Tiotropium Bromide Monohydrate (SPIRIVA RESPIMAT) 2.5 MCG/ACT AERS Inhale 2 puffs into the lungs daily. (Patient not taking: Reported on 11/06/2021) 4 g 0      Drug Regimen Review  Drug regimen was reviewed and remains appropriate with no significant issues identified   Home: Home Living Family/patient expects to be discharged to:: Private residence Living Arrangements: Alone Available Help at Discharge: Family, Available 24 hours/day Type of Home: House Home Access: Stairs to  enter [employer] of Steps: 2 Entrance Stairs-Rails: None Home Layout: One level Bathroom Shower/Tub: Chiropodist: Standard Home Equipment: BSC/3in1   Functional History: Prior Function Prior Level of Function : Independent/Modified Independent, Driving, History of Falls (last six months) Mobility Comments: Does not use an AD but reports multiple recent falls. ADLs Comments: Not driving as often anymore.   Functional Status:  Mobility: Bed Mobility Overal bed mobility: Needs Assistance Bed Mobility: Rolling, Supine to Sit, Sit to Supine Rolling: Supervision Supine to sit: Min assist Sit to supine: Min assist General bed mobility comments: Able to get to EOB without assist, increased time. Transfers Overall transfer level: Needs assistance Equipment used: 1 person hand held assist Transfers: Sit to/from Stand Sit to Stand: Min guard Bed to/from chair/wheelchair/BSC transfer type:: Step pivot Step pivot transfers: Min assist Transfer via Lift Equipment: [place] transfer comment: Min guard for safety to stand from EOB, MIn A to step pivot to get to [facility], flexed trunk. Stood from chair X1. Ambulation/Gait Ambulation/Gait assistance: Min assist, Min guard, +2 safety/equipment Gait Distance (Feet): 45 Feet Assistive device: Rolling walker (2 wheels) Gait Pattern/deviations: Step-through pattern, Decreased stride length, Decreased step length - left, Decreased step length - right, Trunk flexed General Gait Details: Slow, mildly unsteady gait with decreased step lengths bilaterally and constant cues to use hands on RW and not lean on forearms. VSS on RA. Gait velocity: very slow Gait velocity interpretation: <1.31 ft/sec, indicative of household ambulator   ADL: ADL Overall ADL's : Needs assistance/impaired Eating/Feeding: Set up Grooming: Applying deodorant Upper Body Bathing: Minimal assistance Lower Body Bathing: Moderate  assistance Upper Body Dressing : Minimal assistance Lower Body Dressing: Moderate assistance Toilet Transfer: Minimal assistance General ADL Comments: pt hoyer lifted to chair to help progress to upright posture and help to mentally progress in task. pt tolerated hoyer and reports that it "wasnt bad"   Cognition: Cognition Overall Cognitive Status: Impaired/Different from baseline Orientation Level: Oriented X4 Cognition Arousal/Alertness: Awake/alert Behavior During Therapy: WFL for tasks assessed/performed Overall Cognitive Status: Impaired/Different from baseline Area of Impairment: Awareness Awareness: Emergent General Comments: Pt in good spirits today; laughing appropriately with this therapist. Awake and alert. Needs  some encouragement.   Physical Exam: Blood pressure (!) 154/65, pulse 88, temperature 98.6 F (37 C), temperature source Oral, resp. rate 19, height 5\' 1"  (1.549 m), weight 68.3 kg, SpO2 94 %. Physical Exam Vitals and nursing note reviewed.  Constitutional:      Appearance: She is obese.     Comments: Pt sitting up in bed; asking to be set up/then put back down- vague, tangential; couldn't remember that was c/o constipation to nursing (per nursing) 20 minutes prior; says "I don't know where my head is at". NAD  HENT:     Head: Normocephalic and atraumatic.     Comments: Smile equal; tongue midline    Nose: Nose normal. No congestion.     Mouth/Throat:     Mouth: Mucous membranes are moist.     Pharynx: Oropharynx is clear. No oropharyngeal exudate.  Eyes:     General:        Right eye: No discharge.        Left eye: No discharge.     Extraocular Movements: Extraocular movements intact.  Cardiovascular:     Rate and Rhythm: Normal rate and regular rhythm.     Heart sounds: Normal heart sounds. No murmur heard.   No gallop.  Pulmonary:     Effort: Pulmonary effort is normal. No respiratory distress.     Breath sounds: Normal breath sounds. No wheezing or  rales.  Abdominal:     General: Bowel sounds are normal. There is no distension.     Palpations: Abdomen is soft.     Tenderness: There is no abdominal tenderness.  Musculoskeletal:        General: No swelling or deformity.     Cervical back: Neck supple. No tenderness.     Comments: 4+/5 in Biceps, triceps, WE, grip and FA B/L  4+/5 in HF/KE/DF and PF B/L- Poor effort vs weakness- hard to determine  Skin:    Comments: L abdominal incision from L of umbilicus to LLQ- staples intact- no drainage; a little puffy L groin /inguinal incision- from Mid inguinal to pubis- slightly moist- staples intact- no drainage seen- no erythema on either.    Neurological:     Mental Status: She is alert.     Comments: Pleasantly confused; vague, tangential- Ox1-2  Psychiatric:        Mood and Affect: Mood normal.        Behavior: Behavior normal.      Lab Results Last 48 Hours        Results for orders placed or performed during the hospital encounter of 11/06/21 (from the past 48 hour(s))  Glucose, capillary     Status: Abnormal    Collection Time: 11/18/21 11:24 AM  Result Value Ref Range    Glucose-Capillary 114 (H) 70 - 99 mg/dL      Comment: Glucose reference range applies only to samples taken after fasting for at least 8 hours.  Glucose, capillary     Status: Abnormal    Collection Time: 11/18/21  5:14 PM  Result Value Ref Range    Glucose-Capillary 150 (H) 70 - 99 mg/dL      Comment: Glucose reference range applies only to samples taken after fasting for at least 8 hours.  Glucose, capillary     Status: None    Collection Time: 11/18/21  7:57 PM  Result Value Ref Range    Glucose-Capillary 86 70 - 99 mg/dL      Comment: Glucose reference range applies  only to samples taken after fasting for at least 8 hours.  Glucose, capillary     Status: None    Collection Time: 11/18/21 11:34 PM  Result Value Ref Range    Glucose-Capillary 95 70 - 99 mg/dL      Comment: Glucose reference range  applies only to samples taken after fasting for at least 8 hours.  Glucose, capillary     Status: None    Collection Time: 11/19/21  3:58 AM  Result Value Ref Range    Glucose-Capillary 94 70 - 99 mg/dL      Comment: Glucose reference range applies only to samples taken after fasting for at least 8 hours.  Glucose, capillary     Status: Abnormal    Collection Time: 11/19/21  7:49 AM  Result Value Ref Range    Glucose-Capillary 101 (H) 70 - 99 mg/dL      Comment: Glucose reference range applies only to samples taken after fasting for at least 8 hours.  CBC     Status: Abnormal    Collection Time: 11/19/21  8:00 AM  Result Value Ref Range    WBC 11.0 (H) 4.0 - 10.5 K/uL    RBC 3.16 (L) 3.87 - 5.11 MIL/uL    Hemoglobin 9.7 (L) 12.0 - 15.0 g/dL    HCT 30.2 (L) 36.0 - 46.0 %    MCV 95.6 80.0 - 100.0 fL    MCH 30.7 26.0 - 34.0 pg    MCHC 32.1 30.0 - 36.0 g/dL    RDW 14.5 11.5 - 15.5 %    Platelets 200 150 - 400 K/uL    nRBC 0.0 0.0 - 0.2 %      Comment: Performed at [facility], [place] [address], [place], [place] [address]  Basic metabolic panel     Status: Abnormal    Collection Time: 11/19/21  8:00 AM  Result Value Ref Range    Sodium 138 135 - 145 mmol/L    Potassium 3.6 3.5 - 5.1 mmol/L    Chloride 100 98 - 111 mmol/L    CO2 30 22 - 32 mmol/L    Glucose, Bld 96 70 - 99 mg/dL      Comment: Glucose reference range applies only to samples taken after fasting for at least 8 hours.    BUN 7 (L) 8 - 23 mg/dL    Creatinine, Ser 0.41 (L) 0.44 - 1.00 mg/dL    Calcium 8.6 (L) 8.9 - 10.3 mg/dL    GFR, Estimated >60 >60 mL/min      Comment: (NOTE) Calculated using the CKD-EPI Creatinine Equation (2021)      Anion gap 8 5 - 15      Comment: Performed at [facility] [address] [place], [place] [address]  Glucose, capillary     Status: Abnormal    Collection Time: 11/19/21 11:20 AM  Result Value Ref Range    Glucose-Capillary 139 (H) 70 - 99 mg/dL      Comment: Glucose  reference range applies only to samples taken after fasting for at least 8 hours.  Glucose, capillary     Status: Abnormal    Collection Time: 11/19/21  3:56 PM  Result Value Ref Range    Glucose-Capillary 107 (H) 70 - 99 mg/dL      Comment: Glucose reference range applies only to samples taken after fasting for at least 8 hours.  Glucose, capillary     Status: Abnormal  Collection Time: 11/19/21  7:50 PM  Result Value Ref Range    Glucose-Capillary 110 (H) 70 - 99 mg/dL      Comment: Glucose reference range applies only to samples taken after fasting for at least 8 hours.  Glucose, capillary     Status: Abnormal    Collection Time: 11/19/21 11:09 PM  Result Value Ref Range    Glucose-Capillary 102 (H) 70 - 99 mg/dL      Comment: Glucose reference range applies only to samples taken after fasting for at least 8 hours.  Glucose, capillary     Status: None    Collection Time: 11/20/21  3:52 AM  Result Value Ref Range    Glucose-Capillary 89 70 - 99 mg/dL      Comment: Glucose reference range applies only to samples taken after fasting for at least 8 hours.  Glucose, capillary     Status: Abnormal    Collection Time: 11/20/21  8:16 AM  Result Value Ref Range    Glucose-Capillary 227 (H) 70 - 99 mg/dL      Comment: Glucose reference range applies only to samples taken after fasting for at least 8 hours.       Imaging Results (Last 48 hours)  DG Chest 2 View   Result Date: 11/19/2021 CLINICAL DATA:  Chest pain, generalized pain EXAM: CHEST - 2 VIEW COMPARISON:  11/12/2021 FINDINGS: No focal consolidation. No pleural effusion or pneumothorax. Heart and mediastinal contours are unremarkable. Thoracic aortic stent noted. No acute osseous abnormality. IMPRESSION: No active cardiopulmonary disease. Electronically Signed   By: Kathreen Devoid M.D.   On: 11/19/2021 09:38             Medical Problem List and Plan: 1. Functional deficits/debility secondary to aortic dissection -pt was very  clear she panics when told "serious things"-              -patient may  shower- if covers incisions             -ELOS/Goals: 12-14 days- supervision 2.  Antithrombotics: -DVT/anticoagulation:  Pharmaceutical: Heparin [place] q 8 hours             -antiplatelet therapy: n/a 3. Pain Management: Tylenol, Neurontin, Robaxin, oxycodone             -family said don't want her to have robaxin/gabapentin- will change to skelaxin and d/c gabapentin 4. Mood: LCSW to evaluate and provide emotional support             -antipsychotic agents: n/a 5. Neuropsych: This patient is capable of making decisions on her own behalf. 6. Skin/Wound Care: Routine skin care checks             -- Monitor LLQ, left groin incision 7. Fluids/Electrolytes/Nutrition: Routine ins and outs and follow-up chemistries             -- Heart healthy diet, thin liquids             --Glucose has been in normal range; DC SSI and follow-up BMET 8. Blood loss anemia: Monitor CBC 9. Hypertension: continue Coreg, Norvasc, Cozaar 10. Hyperlipidemia: continue Lipitor 11: Tobacco use: Provide cessation counseling             --Nicotine patch ?             --Continue pulmonary toilet 12: COPD: Continue Yupelri nebulizer daily 13: GERD: continue Protonix 14: Hyperthyroidismmulti-nodular goiter:  --Dr. Loanne Drilling, endocrinologist>>not on methimazole currently --TSH = 0.022 1/20, normal  T3, T4 this admission 15: History of pulmonary nodules: stable in size of 5 mm nodule on 1/18 as compared to 2016 16. Constipation?- per nursing, LBM this AM, but feels constipated-  17. Feels cold- will order kpad for feeling cold/pain    I have personally performed a face to face diagnostic evaluation of this patient and formulated the key components of the plan.  Additionally, I have personally reviewed laboratory data, imaging studies, as well as relevant notes and concur with the physician assistant's documentation above.   The patient's status has not  changed from the original H&P.  Any changes in documentation from the acute care chart have been noted above.    Barbie Banner, PA-C 11/20/2021

## 2021-11-22 DIAGNOSIS — I7100 Dissection of unspecified site of aorta: Secondary | ICD-10-CM | POA: Diagnosis not present

## 2021-11-22 LAB — CBC WITH DIFFERENTIAL/PLATELET
Abs Immature Granulocytes: 0.08 10*3/uL — ABNORMAL HIGH (ref 0.00–0.07)
Basophils Absolute: 0 10*3/uL (ref 0.0–0.1)
Basophils Relative: 0 %
Eosinophils Absolute: 0.2 10*3/uL (ref 0.0–0.5)
Eosinophils Relative: 2 %
HCT: 29 % — ABNORMAL LOW (ref 36.0–46.0)
Hemoglobin: 9.2 g/dL — ABNORMAL LOW (ref 12.0–15.0)
Immature Granulocytes: 1 %
Lymphocytes Relative: 19 %
Lymphs Abs: 2 10*3/uL (ref 0.7–4.0)
MCH: 31.1 pg (ref 26.0–34.0)
MCHC: 31.7 g/dL (ref 30.0–36.0)
MCV: 98 fL (ref 80.0–100.0)
Monocytes Absolute: 1.1 10*3/uL — ABNORMAL HIGH (ref 0.1–1.0)
Monocytes Relative: 10 %
Neutro Abs: 7.3 10*3/uL (ref 1.7–7.7)
Neutrophils Relative %: 68 %
Platelets: 111 10*3/uL — ABNORMAL LOW (ref 150–400)
RBC: 2.96 MIL/uL — ABNORMAL LOW (ref 3.87–5.11)
RDW: 15.2 % (ref 11.5–15.5)
WBC: 10.7 10*3/uL — ABNORMAL HIGH (ref 4.0–10.5)
nRBC: 0 % (ref 0.0–0.2)

## 2021-11-22 LAB — COMPREHENSIVE METABOLIC PANEL
ALT: 44 U/L (ref 0–44)
AST: 53 U/L — ABNORMAL HIGH (ref 15–41)
Albumin: 2.2 g/dL — ABNORMAL LOW (ref 3.5–5.0)
Alkaline Phosphatase: 77 U/L (ref 38–126)
Anion gap: 9 (ref 5–15)
BUN: 13 mg/dL (ref 8–23)
CO2: 27 mmol/L (ref 22–32)
Calcium: 8.6 mg/dL — ABNORMAL LOW (ref 8.9–10.3)
Chloride: 101 mmol/L (ref 98–111)
Creatinine, Ser: 0.47 mg/dL (ref 0.44–1.00)
GFR, Estimated: >60 mL/min (ref >60)
Glucose, Bld: 90 mg/dL (ref 70–99)
Potassium: 3.8 mmol/L (ref 3.5–5.1)
Sodium: 137 mmol/L (ref 135–145)
Total Bilirubin: 0.6 mg/dL (ref 0.3–1.2)
Total Protein: 6 g/dL — ABNORMAL LOW (ref 6.5–8.1)

## 2021-11-22 LAB — HEMOGLOBIN A1C
Hgb A1c MFr Bld: 5 % (ref 4.8–5.6)
Mean Plasma Glucose: 96.8 mg/dL

## 2021-11-22 MED ORDER — SODIUM CHLORIDE 0.9% FLUSH
10.0000 mL | Freq: Two times a day (BID) | INTRAVENOUS | Status: DC
  Administered 2021-11-22 – 2021-11-25 (×4): 10 mL

## 2021-11-22 MED ORDER — CHLORHEXIDINE GLUCONATE CLOTH 2 % EX PADS
6.0000 | MEDICATED_PAD | Freq: Every day | CUTANEOUS | Status: DC
  Administered 2021-11-22: 6 via TOPICAL

## 2021-11-22 MED ORDER — LACTULOSE ENEMA
300.0000 mL | Freq: Once | ORAL | Status: AC
  Administered 2021-11-22: 300 mL via RECTAL
  Filled 2021-11-22: qty 300

## 2021-11-22 NOTE — Progress Notes (Signed)
Inpatient [facility] Individual Statement of Services  Patient Name:  Darlene Griffith  Date:  11/22/2021  Welcome to the [facility].  Our goal is to provide you with an individualized program based on your diagnosis and situation, designed to meet your specific needs.  With this comprehensive rehabilitation program, you will be expected to participate in at least 3 hours of rehabilitation therapies Monday-Friday, with modified therapy programming on the weekends.  Your rehabilitation program will include the following services:  Physical Therapy (PT), Occupational Therapy (OT), Speech Therapy (ST), 24 hour per day rehabilitation nursing, Therapeutic Recreaction (TR), Neuropsychology, Care Coordinator, Rehabilitation Medicine, Nutrition Services, Pharmacy Services, and Other  Weekly team conferences will be held on Wednesdays to discuss your progress.  Your Inpatient Rehabilitation Care Coordinator will talk with you frequently to get your input and to update you on team discussions.  Team conferences with you and your family in attendance may also be held.  Expected length of stay:  12-14 Days  Overall anticipated outcome:  Supervision to MOD I  Depending on your progress and recovery, your program may change. Your Inpatient Rehabilitation Care Coordinator will coordinate services and will keep you informed of any changes. Your Inpatient Rehabilitation Care Coordinator's name and contact numbers are listed  below.  The following services may also be recommended but are not provided by the [facility]:   [place] will be made to provide these services after discharge if needed.  Arrangements include referral to agencies that provide these services.  Your insurance has been verified to be:  [place] Your primary doctor is:  [place], Malka So, MD  Pertinent  information will be shared with your doctor and your insurance company.  Inpatient Rehabilitation Care Coordinator:  Erlene Quan, [place] or [phone]  Information discussed with and copy given to patient by: Dyanne Iha, 11/22/2021, 11:17 AM

## 2021-11-22 NOTE — Progress Notes (Addendum)
2200: Pt continues to c/o 9/10 sharp, shooting, throbbing, ongoing pain to L breast and back after giving 650 mg tylenol, PRN Oxy, and trazodone for sleep and discomfort. She c/o of pain at surgical sites and lower back. Heat applied to lower back. Pt c/o that the room is cold. Temp increased in room for comfort.   0015: Pain pain is unresolved. Repositioned patient, provided reassurance, and made sure personal items were within reach. Pt is crying and c/o increasing pain. PRN Metaxalone given at this time. Pt is a/o times 4, speaks in clear sentences, Resp are even and unlabored and Pt is able to express needs and wants.   RN will continue to round on pt every hour and monitor and assess pain and reposition pt every 2 hours.    0200: Pt is asleep, resting well, she has not called out for pain medication. Resp are even and unlabored. No distress is noted at this time.   0300: Pt is asleep. No acute distress noted at this time. RN will continue to monitor and assess for distress. Bed pan offered and denies any needs at this time.

## 2021-11-22 NOTE — Evaluation (Signed)
Speech Language Pathology Assessment and Plan  Patient Details  Name: Darlene Griffith MRN: [id number] Date of Birth: 1951/07/04  SLP Diagnosis: Cognitive Impairments  Rehab Potential: Excellent ELOS: 7-10 days   Today's Date: 11/22/2021 SLP Individual Time: 1300-1400 SLP Individual Time Calculation (min): 60 min  Hospital Problem: Principal Problem:   Aortic dissection [facility])  Past Medical History:  Past Medical History:  Diagnosis Date   Chest pain    a. 01/2015 Echo: Nl LV fxn, Gr 1 DD, triv AI, mild MR.   Essential hypertension    Family history of lung cancer    Hepatic cyst    a. noted on CT 01/2015.   Hyperthyroidism    GOING TO [facility] FOR SECOND OPINION   Multinodular goiter    a. 01/2015 CT chest: multinodular goidter w/ substernal extension of the left lobe of the thyroid assoc w/ rightward deviation of tracheal air column.   Neck pain, chronic    Personal history of colonic polyps    Pulmonary nodules    a. 01/2015 CT Chest: RLL ~ 67mm subpleural nodule - rec f/u in 6-12 mos.   Splenic cyst    a. noted on CT 01/2015.   Past Surgical History:  Past Surgical History:  Procedure Laterality Date   BYPASS GRAFT AORTA TO AORTA Left 11/11/2021   Procedure: BYPASS GRAFT AORTA TO LEFT COMMON  FEMORAL ARTERY;  Surgeon: Cherre Robins, MD;  Location: [place];  Service: Vascular;  Laterality: Left;   CERVICAL DISC ARTHROPLASTY N/A 07/05/2019   Procedure: Cervical Six-Seven Artificial disc replacement;  Surgeon: Kristeen Miss, MD;  Location: [place];  Service: Neurosurgery;  Laterality: N/A;  Cervical Six-Seven Artificial disc replacement   CESAREAN SECTION     COLONOSCOPY  01/13/2020   THORACIC AORTIC ENDOVASCULAR STENT GRAFT N/A 11/11/2021   Procedure: THORACIC AORTIC ENDOVASCULAR STENT GRAFT WITH LEFT BRACHIAL  ARTERY ACCESS;  Surgeon: Cherre Robins, MD;  Location: [facility] OR;  Service: Vascular;  Laterality: N/A;   TONSILLECTOMY     AROUND 5-6 YRS OLD   ULTRASOUND GUIDANCE FOR VASCULAR  ACCESS Bilateral 11/11/2021   Procedure: ULTRASOUND GUIDANCE FOR VASCULAR ACCESS;  Surgeon: Cherre Robins, MD;  Location: [facility] OR;  Service: Vascular;  Laterality: Bilateral;   UPPER GASTROINTESTINAL ENDOSCOPY  01/13/2020    Assessment / Plan / Recommendation Clinical Impression Darlene Griffith is a 71 year old female who presented to the emergency department with acute onset of the upper thoracic region pain radiating circumferentially around her chest. Past medical history significant for hypertension on Norvasc and Cozaar. Imaging revealed Type B aortic dissection extending into the infra-renal aorta and right common iliac artery. Despite medical management, she continued to have persistent 10 out of 10 mid scapular back pain radiating to her chest and abdomen. On 1/23 she was taken to the operating room and underwent thoracic branched endovascular aortic repair with a left common and external iliac stenting, left common iliac to common femoral artery bypass with PTFE graft. During the procedure, a rapid drop in blood pressure occurred and angiogram confirmed complete disruption of the external iliac artery. Endovascular repair was attempted and the patient's blood pressure continued further decline and CPR was initiated which restored a perfusion pressure. She remained intubated and observed in the ICU. Follow-up CTA was performed on 1/24 which revealed possible pneumonia and cefepime initiated. She was extubated on 1/24 and continued observation in the ICU. Her nasogastric tube was discontinued on 1/28 and she was tolerating small amounts of p.o.  intake. Other significant medical history includes hyperlipidemia, tobacco abuse, overactive bladder, gastroesophageal reflux disease, COPD, seizure disorder. Underwent cervical disc arthroplasty in 2020 by Dr. Ellene Route. The patient requires inpatient medicine and rehabilitation evaluations and services for ongoing dysfunction secondary to aortic dissection repair  complicated by brief cardiac arrest and acute blood loss anemia, intensive care monitoring. Lives alone with excellent family support and very involved with grandchildren. Has one dog at home, [place].   Patient demonstrates at least mild cognitive impairments characterized by decreased orientation to situation, sustained attention, recall of functional information, and decreased emergent awareness. Patient exhibited significant internal distractibility to pain, and external distractibility to TV and stimuli in hallway. Pt reported 10/10 pain under left breast and back which almost brought patient to tears. Pt benefited from repositioning, distraction, and emotional support. Nurse made aware. SLP administered the [facility] Mental Status Examination (SLUMS) and patient scored 23/30 points with a score of 27 or above considered normal. Patient would benefit re-assessment once pain improves. Patient's overall auditory comprehension and verbal expression appeared [facility] for all tasks assessed and patient was 100% intelligible at the conversation level. Pt denied acute chewing/swallowing difficulty and currently tolerating regular textures and thin liquids. Patient benefit from skilled SLP intervention to maximize her cognitive functioning and overall functional independence prior to discharge.    Skilled Therapeutic Interventions          Pt participated in [place] Status Examination (SLUMS) as well as further non-standardized assessments of cognitive-linguistic, speech, and language function. Please see above.     SLP Assessment  Patient will need skilled [facility] during CIR admission    Recommendations  Patient destination: Home Follow up Recommendations: Other (comment) (TBD) Equipment Recommended: None recommended by SLP    SLP Frequency 3 to 5 out of 7 days   SLP Duration  SLP Intensity  SLP Treatment/Interventions 7-10 days  Minumum of 1-2  x/day, 30 to 90 minutes  Cueing hierarchy;Environmental controls;Functional tasks;Internal/external aids;Patient/family education;Therapeutic Activities    Pain Pain Assessment Pain Scale: 0-10 Pain Score: 10-Worst pain ever Faces Pain Scale: Hurts worst Pain Type: Acute pain Pain Location: Chest (under left breast) Pain Orientation: Left Pain Descriptors / Indicators: Aching;Burning;Stabbing Pain Onset: On-going Pain Intervention(s): Medication (See eMAR);RN made aware;Emotional support;Repositioned;Distraction Multiple Pain Sites: No  Prior Functioning Cognitive/Linguistic Baseline: Information not available Type of Home: House  Lives With: Alone Available Help at Discharge: Family;Available 24 hours/day Vocation: Part time employment  SLP Evaluation Cognition Overall Cognitive Status: Impaired/Different from baseline Orientation Level: Oriented to person;Disoriented to situation;Oriented to place;Oriented to time Year: 2023 Month: February Day of Week: Incorrect Attention: Sustained Sustained Attention: Impaired Sustained Attention Impairment: Verbal basic;Functional basic Memory: Impaired Memory Impairment: Decreased recall of new information;Decreased short term memory Decreased Short Term Memory: Verbal basic;Functional basic Awareness: Impaired Awareness Impairment: Emergent impairment Problem Solving: Impaired Problem Solving Impairment: Verbal complex Behaviors: Restless Safety/Judgment: Impaired  Comprehension Auditory Comprehension Overall Auditory Comprehension: Appears within functional limits for tasks assessed Visual Recognition/Discrimination Discrimination: Not tested Reading Comprehension Reading Status: Not tested Expression Expression Primary Mode of Expression: Verbal Verbal Expression Overall Verbal Expression: Appears within functional limits for tasks assessed Written Expression Dominant Hand: Left Written Expression: Not tested Oral  Motor Oral Motor/Sensory Function Overall Oral Motor/Sensory Function: Within functional limits Motor Speech Overall Motor Speech: Appears within functional limits for tasks assessed Intelligibility: Intelligible  Care Tool Care Tool Cognition Ability to hear (with hearing aid or hearing appliances if normally used  Ability to hear (with hearing aid or hearing appliances if normally used): 0. Adequate - no difficulty in normal conservation, social interaction, listening to TV   Expression of Ideas and Wants Expression of Ideas and Wants: 4. Without difficulty (complex and basic) - expresses complex messages without difficulty and with speech that is clear and easy to understand   Understanding Verbal and Non-Verbal Content Understanding Verbal and Non-Verbal Content: 3. Usually understands - understands most conversations, but misses some part/intent of message. Requires cues at times to understand  Memory/Recall Ability Memory/Recall Ability : Current season;That he or she is in a hospital/hospital unit   Short Term Goals: Week 1: SLP Short Term Goal 1 (Week 1): STG=LTG due to ELOS  Refer to Care Plan for Long Term Goals  Recommendations for other services: None   Discharge Criteria: Patient will be discharged from SLP if patient refuses treatment 3 consecutive times without medical reason, if treatment goals not met, if there is a change in medical status, if patient makes no progress towards goals or if patient is discharged from hospital.  The above assessment, treatment plan, treatment alternatives and goals were discussed and mutually agreed upon: by patient  Patty Sermons 11/22/2021, 4:04 PM

## 2021-11-22 NOTE — Progress Notes (Signed)
Inpatient Rehabilitation Care Coordinator Assessment and Plan Patient Details  Name: Darlene Griffith MRN: [id number] Date of Birth: [birth year]  Today's Date: 11/22/2021  Hospital Problems: Principal Problem:   Aortic dissection [facility])  Past Medical History:  Past Medical History:  Diagnosis Date   Chest pain    a. 01/2015 Echo: Nl LV fxn, Gr 1 DD, triv AI, mild MR.   Essential hypertension    Family history of lung cancer    Hepatic cyst    a. noted on CT 01/2015.   Hyperthyroidism    GOING TO [facility] FOR SECOND OPINION   Multinodular goiter    a. 01/2015 CT chest: multinodular goidter w/ substernal extension of the left lobe of the thyroid assoc w/ rightward deviation of tracheal air column.   Neck pain, chronic    Personal history of colonic polyps    Pulmonary nodules    a. 01/2015 CT Chest: RLL ~ 87mm subpleural nodule - rec f/u in 6-12 mos.   Splenic cyst    a. noted on CT 01/2015.   Past Surgical History:  Past Surgical History:  Procedure Laterality Date   BYPASS GRAFT AORTA TO AORTA Left 11/11/2021   Procedure: BYPASS GRAFT AORTA TO LEFT COMMON  FEMORAL ARTERY;  Surgeon: Cherre Robins, MD;  Location: [place];  Service: Vascular;  Laterality: Left;   CERVICAL DISC ARTHROPLASTY N/A 07/05/2019   Procedure: Cervical Six-Seven Artificial disc replacement;  Surgeon: Kristeen Miss, MD;  Location: [place];  Service: Neurosurgery;  Laterality: N/A;  Cervical Six-Seven Artificial disc replacement   CESAREAN SECTION     COLONOSCOPY  01/13/2020   THORACIC AORTIC ENDOVASCULAR STENT GRAFT N/A 11/11/2021   Procedure: THORACIC AORTIC ENDOVASCULAR STENT GRAFT WITH LEFT BRACHIAL  ARTERY ACCESS;  Surgeon: Cherre Robins, MD;  Location: [facility] OR;  Service: Vascular;  Laterality: N/A;   TONSILLECTOMY     AROUND 5-6 YRS OLD   ULTRASOUND GUIDANCE FOR VASCULAR ACCESS Bilateral 11/11/2021   Procedure: ULTRASOUND GUIDANCE FOR VASCULAR ACCESS;  Surgeon: Cherre Robins, MD;  Location: [place];  Service:  Vascular;  Laterality: Bilateral;   UPPER GASTROINTESTINAL ENDOSCOPY  01/13/2020   Social History:  reports that she has been smoking cigarettes. She has never used smokeless tobacco. She reports current alcohol use. She reports that she does not use drugs.  Family / Support Systems Marital Status: Single Children: Quarry manager (Daughter), Agricultural consultant (Daughter) Anticipated Caregiver: children to rotate care Ability/Limitations of Caregiver: none Caregiver Availability: 24/7 Family Dynamics: support from children  Social History Preferred language: English Religion: [employer] Employment Status: Employed   Abuse/Neglect Abuse/Neglect Assessment Can Be Completed: Yes Physical Abuse: Denies Verbal Abuse: Denies Sexual Abuse: Denies Exploitation of patient/patient's resources: Denies Self-Neglect: Denies  Patient response to: Social Isolation - How often do you feel lonely or isolated from those around you?: Never  Emotional Status Recent Psychosocial Issues: coping Psychiatric History: n/a Substance Abuse History: n/a  Patient / Family Perceptions, Expectations & Goals Pt/Family understanding of illness & functional limitations: yes Premorbid pt/family roles/activities: working, driving, intermittent use of the rollator Anticipated changes in roles/activities/participation: children plans to rotate assistance Pt/family expectations/goals: supervision to [place]: None Premorbid Home Care/DME Agencies: Other (Comment) (bac and rollator) Transportation available at discharge: family able to transport Is the patient able to respond to transportation needs?: Yes In the past 12 months, has lack of transportation kept you from medical appointments or from getting medications?: No In the past 12 months, has  lack of transportation kept you from meetings, work, or from getting things needed for daily living?: No  Discharge Planning Living Arrangements:  Alone Support Systems: Children Type of Residence: Private residence Insurance Resources: Multimedia programmer (specify), Medicare Financial Screen Referred: No Living Expenses: Own Money Management: Patient Does the patient have any problems obtaining your medications?: No Home Management: independent Patient/Family Preliminary Plans: children willing to assist Care Coordinator Barriers to Discharge: Insurance for SNF coverage Care Coordinator Anticipated Follow Up Needs: HH/OP Expected length of stay: 12-14 Days  Clinical Impression Sw made attempt to contact patient daughter, no answer- unable to leave VM. Will continue to follow up  Dyanne Iha 11/22/2021, 11:16 AM

## 2021-11-22 NOTE — Progress Notes (Signed)
Patient ID: Darlene Griffith, female   DOB: 1951-08-04, 71 y.o.   MRN: [id number] Met with the patient to review role, rehab process, team conference and plan of care. Discussed pain management, positioning along with secondary risk management including HTN, HLD (LDL 103/Trig 192), smoking cessation. Reviewed medications and dietary modification recommendations. Continue to follow along to discharge to address educational needs and facilitate preparation for discharge. Margarito Liner

## 2021-11-22 NOTE — Evaluation (Signed)
Physical Therapy Assessment and Plan  Patient Details  Name: Darlene Griffith MRN: [id number] Date of Birth: [birth year]  PT Diagnosis: Difficulty walking, Edema, Impaired cognition, Muscle weakness, and Pain in chest  Rehab Potential: Good ELOS: 7-10 days   Today's Date: 11/22/2021 PT Individual Time:  -       Hospital Problem: Principal Problem:   Aortic dissection [facility])   Past Medical History:  Past Medical History:  Diagnosis Date   Chest pain    a. 01/2015 Echo: Nl LV fxn, Gr 1 DD, triv AI, mild MR.   Essential hypertension    Family history of lung cancer    Hepatic cyst    a. noted on CT 01/2015.   Hyperthyroidism    GOING TO [facility] FOR SECOND OPINION   Multinodular goiter    a. 01/2015 CT chest: multinodular goidter w/ substernal extension of the left lobe of the thyroid assoc w/ rightward deviation of tracheal air column.   Neck pain, chronic    Personal history of colonic polyps    Pulmonary nodules    a. 01/2015 CT Chest: RLL ~ 43mm subpleural nodule - rec f/u in 6-12 mos.   Splenic cyst    a. noted on CT 01/2015.   Past Surgical History:  Past Surgical History:  Procedure Laterality Date   BYPASS GRAFT AORTA TO AORTA Left 11/11/2021   Procedure: BYPASS GRAFT AORTA TO LEFT COMMON  FEMORAL ARTERY;  Surgeon: Cherre Robins, MD;  Location: [place];  Service: Vascular;  Laterality: Left;   CERVICAL DISC ARTHROPLASTY N/A 07/05/2019   Procedure: Cervical Six-Seven Artificial disc replacement;  Surgeon: Kristeen Miss, MD;  Location: [place];  Service: Neurosurgery;  Laterality: N/A;  Cervical Six-Seven Artificial disc replacement   CESAREAN SECTION     COLONOSCOPY  01/13/2020   THORACIC AORTIC ENDOVASCULAR STENT GRAFT N/A 11/11/2021   Procedure: THORACIC AORTIC ENDOVASCULAR STENT GRAFT WITH LEFT BRACHIAL  ARTERY ACCESS;  Surgeon: Cherre Robins, MD;  Location: [facility] OR;  Service: Vascular;  Laterality: N/A;   TONSILLECTOMY     AROUND 5-6 YRS OLD   ULTRASOUND GUIDANCE FOR VASCULAR  ACCESS Bilateral 11/11/2021   Procedure: ULTRASOUND GUIDANCE FOR VASCULAR ACCESS;  Surgeon: Cherre Robins, MD;  Location: [facility] OR;  Service: Vascular;  Laterality: Bilateral;   UPPER GASTROINTESTINAL ENDOSCOPY  01/13/2020    Assessment & Plan Clinical Impression: Patient is a a 71 year old female who presented to the emergency department with acute onset of the upper thoracic region pain radiating circumferentially around her chest. This was approximately 1 hour to presentation on 11/06/2021.  Past medical history significant for hypertension on Norvasc and Cozaar. Imaging revealed Type B aortic dissection extending into the infra-renal aorta and right common iliac artery. Vascular consultation obtained by Dr. Stanford Breed. Admitted to CCU by CCM. Esmolol started for hypertension but changed to labetalol for better efficacy. ECG with sinus rhythm and troponin and BNP were within normal limits.  Follow-up CTA of the chest performed on 1/20 with stable appearance of dissection.  Despite medical management, she continued to have persistent 10 out of 10 mid scapular back pain radiating to her chest and abdomen.  On 1/23 she was taken to the operating room and underwent thoracic branched endovascular aortic repair with a left common and external iliac stenting, left common iliac to common femoral artery bypass with PTFE graft.  This was performed through bilateral common femoral artery access and left brachial artery exposure percutaneous access.  During the procedure, a  rapid drop in blood pressure occurred and angiogram confirmed complete disruption of the external iliac artery.  Endovascular repair was attempted and the patient's blood pressure continued further decline and CPR was initiated which restored a perfusion pressure.  ROSC was achieved within 3 minutes.  Rapid exposure of the infrarenal aorta was achieved to the left retroperitoneal exposure.  A surgical drain was left to the retroperitoneal space.  At  completion of the case, Doppler flow was confirmed in the left radial artery and bilateral posterior tibial arteries.  She remained intubated and observed in the ICU.  She required Cardene infusion on 1/20 for hypertension follow-up CTA was performed on 1/24 which revealed possible pneumonia and cefepime initiated.  She was extubated on 1/24 and continued observation in the ICU.  Her nasogastric tube was discontinued on 1/28 and she was tolerating small amounts of p.o. intake.  Cefepime was stopped on 1/30.  Her hemoglobin remained stable at 9.2. The patient requires inpatient medicine and rehabilitation evaluations and services for ongoing dysfunction secondary to aortic dissection repair complicated by brief cardiac arrest and acute blood loss anemia, intensive care monitoring.   Other significant medical history includes hyperlipidemia, tobacco abuse, overactive bladder, gastroesophageal reflux disease, COPD, seizure disorder.   Patient transferred to CIR on 11/21/2021 .   Patient currently requires total with mobility secondary to muscle weakness and muscle joint tightness, decreased cardiorespiratoy endurance, decreased motor planning, decreased initiation, decreased attention, decreased awareness, decreased problem solving, decreased safety awareness, decreased memory, and delayed processing, and decreased sitting balance, decreased standing balance, decreased postural control, hemiplegia, and decreased balance strategies.  Prior to hospitalization, patient was modified independent  with mobility and lived with Alone in a House home.  Home access is 2Stairs to enter.  Patient will benefit from skilled PT intervention to maximize safe functional mobility, minimize fall risk, and decrease caregiver burden for planned discharge home with 24 hour supervision.  Anticipate patient will benefit from follow up [place] at discharge.  PT - End of Session Activity Tolerance: Tolerates < 10 min activity, no significant  change in vital signs Endurance Deficit: Yes Endurance Deficit Description: refused all out of bed mobility on this day. PT Assessment Rehab Potential (ACUTE/IP ONLY): Good PT Barriers to Discharge: Inaccessible home environment;Decreased caregiver support;Medication compliance;Behavior PT Patient demonstrates impairments in the following area(s): Balance;Behavior;Edema;Endurance;Motor;Pain;Perception;Safety;Sensory;Skin Integrity PT Transfers Functional Problem(s): Bed Mobility;Bed to Chair;Car;Furniture PT Locomotion Functional Problem(s): Ambulation;Wheelchair Mobility;Stairs PT Plan PT Intensity: Minimum of 1-2 x/day ,45 to 90 minutes PT Frequency: 5 out of 7 days PT Duration Estimated Length of Stay: 7-10 days PT Treatment/Interventions: Ambulation/gait training;Community reintegration;DME/adaptive equipment instruction;Neuromuscular re-education;Psychosocial support;Wheelchair propulsion/positioning;Stair training;UE/LE Strength taining/ROM;Balance/vestibular training;Discharge planning;Pain management;Skin care/wound management;Therapeutic Activities;UE/LE Coordination activities;Cognitive remediation/compensation;Disease management/prevention;Functional mobility training;Patient/family education;Splinting/orthotics;Therapeutic Exercise;Visual/perceptual remediation/compensation PT Transfers Anticipated Outcome(s): Supervision assist with LRAD PT Recommendation Recommendations for Other Services: Neuropsych consult;Therapeutic Recreation consult Therapeutic Recreation Interventions: Stress management Follow Up Recommendations: Home health PT Patient destination: Home Equipment Recommended: To be determined   PT Evaluation Precautions/Restrictions Precautions Precautions: Fall;Other (comment) Precaution Comments: Keep SBP <160 General   Vital SignsTherapy Vitals Temp: 98.6 F (37 C) Pulse Rate: 66 Resp: 18 BP: 137/64 Patient Position (if appropriate): Lying Oxygen  Therapy SpO2: 98 % Pain Pain Assessment Pain Scale: 0-10 Pain Score: 10-Worst pain ever Pain Type: Acute pain Pain Location: Chest Pain Orientation: Left Pain Descriptors / Indicators: Aching;Sore Pain Onset: On-going Patients Stated Pain Goal: 0 Pain Intervention(s): Medication (See eMAR) Pain Interference Pain Interference Pain Effect on Sleep: 2. Occasionally  Pain Interference with Therapy Activities: 4. Almost constantly Pain Interference with Day-to-Day Activities: 4. Almost constantly Home Living/Prior Functioning Home Living Available Help at Discharge: Family;Available 24 hours/day Type of Home: House Home Access: Stairs to enter [employer] of Steps: 2 Entrance Stairs-Rails: None Home Layout: One level Bathroom Shower/Tub: Chiropodist: Standard Additional Comments: difficult to determine due to no family member present and pt with tangential/impaired cognition  Lives With: Alone Prior Function Level of Independence: Independent with basic ADLs;Independent with homemaking with ambulation;Requires assistive device for independence (occassional use of rollator)  Able to Take Stairs?: Yes Driving: Yes Vocation: Part time employment Vision/Perception  Vision - History Ability to See in Adequate Light: 0 Adequate Vision - Assessment Additional Comments: kept eyes closed through majority of PT treatment. no difficulties noted with informal assessment Perception Perception: Within Functional Limits Praxis Praxis: Intact  Cognition Overall Cognitive Status: Impaired/Different from baseline Year: 2023 Month: February Day of Week: Incorrect Attention: Sustained Sustained Attention: Impaired Sustained Attention Impairment: Verbal basic;Functional basic Memory: Impaired Memory Impairment: Decreased recall of new information;Decreased short term memory Decreased Short Term Memory: Verbal basic;Functional basic Awareness:  Impaired Awareness Impairment: Emergent impairment Problem Solving: Impaired Problem Solving Impairment: Verbal complex Behaviors: Restless Safety/Judgment: Impaired Sensation Sensation Light Touch: Appears Intact Proprioception: Appears Intact Additional Comments: reports mild parasthesia on the RLE but able to detect all stimuli to BLE Coordination Gross Motor Movements are Fluid and Coordinated: Yes Fine Motor Movements are Fluid and Coordinated: Yes Heel Shin Test: limited ROM due to pain with movement. Motor  Motor Motor: Other (comment) Motor - Skilled Clinical Observations: generalized deconditioning from prolonged hospital stay   Trunk/Postural Assessment  Cervical Assessment Cervical Assessment: Exceptions to [facility] (forward head) Thoracic Assessment Thoracic Assessment: Exceptions to [facility] (kyphosis) Lumbar Assessment Lumbar Assessment: Exceptions to [facility] (posterior pelvic tilt when sitting EOB) Postural Control Postural Control: Within Functional Limits  Balance Balance Balance Assessed: No Extremity Assessment      RLE Assessment RLE Assessment: Exceptions to [facility] General Strength Comments: grossly 4-/5 proximal to distal LLE Assessment LLE Assessment: Exceptions to [facility] General Strength Comments: grossly 4-/5 proximal to distal  Care Tool Care Tool Bed Mobility Roll left and right activity   Roll left and right assist level: Maximal Assistance - Patient 25 - 49%    Sit to lying activity Sit to lying activity did not occur: Refused      Lying to sitting on side of bed activity Lying to sitting on side of bed activity did not occur: the ability to move from lying on the back to sitting on the side of the bed with no back support.: Refused       Care Tool Transfers Sit to stand transfer Sit to stand activity did not occur: Refused      Chair/bed transfer Chair/bed transfer activity did not occur: Copywriter, advertising  transfer activity did not occur: Safety/medical concerns        Care Tool Locomotion Ambulation Ambulation activity did not occur: Refused        Walk 10 feet activity Walk 10 feet activity did not occur: Refused       Walk 50 feet with 2 turns activity Walk 50 feet with 2 turns activity did not occur: Refused      Walk 150 feet activity Walk 150 feet activity did not occur: Refused      Walk  10 feet on uneven surfaces activity Walk 10 feet on uneven surfaces activity did not occur: Refused      Stairs Stair activity did not occur: Refused        Walk up/down 1 step activity Walk up/down 1 step or curb (drop down) activity did not occur: Refused      Walk up/down 4 steps activity Walk up/down 4 steps activity did not occur: Refused      Walk up/down 12 steps activity Walk up/down 12 steps activity did not occur: Refused      Pick up small objects from floor Pick up small object from the floor (from standing position) activity did not occur: Refused      Wheelchair Is the patient using a wheelchair?: Yes Type of Wheelchair: Manual Wheelchair activity did not occur: Refused      Wheel 50 feet with 2 turns activity Wheelchair 50 feet with 2 turns activity did not occur: Refused    Wheel 150 feet activity Wheelchair 150 feet activity did not occur: Refused      Refer to Care Plan for Long Term Goals  SHORT TERM GOAL WEEK 1 PT Short Term Goal 1 (Week 1): STG=LTG due to ELOS  Recommendations for other services: Neuropsych and Therapeutic Recreation  Stress management  Skilled Therapeutic Intervention Mobility Bed Mobility Bed Mobility: Rolling Right;Rolling Left Rolling Right: Maximal Assistance - Patient 25-49% (partial roll) Rolling Left: Maximal Assistance - Patient 25-49% (partial roll) Transfers Transfer (Assistive device):  (refused) Locomotion  Gait Ambulation: No Gait Gait: No Stairs / Additional Locomotion Stairs: No Wheelchair  Mobility Wheelchair Mobility: No  Pt received supine in bed and agreeable to PT at bed level. PT instructed patient in PT Evaluation and initiated treatment intervention; see above for results. PT educated patient in [place], rehab potential, rehab goals, and discharge recommendations along with recommendation for follow-up rehabilitation services. PT encourage out of bed activity, but pt declines adamantly due to pain. Pt able to tolerate partial roll with max assist from PT, but declines to complete supine>sit transfer. BLE AROM hip abduction, hip adduction, knee extension, ankle DF/PF. Pt reports mild paraesthesia on the RLE following AROM. UE forwards reach x 4 Bil with mild discomfort noted in the L chest. Throughout session, pt very tangential in conversation, requiring max cues to sustain attention to task with poor recall from events eariler in day. Left supine in bed with call bell in reach and all needs met.  [name]    Discharge Criteria: Patient will be discharged from PT if patient refuses treatment 3 consecutive times without medical reason, if treatment goals not met, if there is a change in medical status, if patient makes no progress towards goals or if patient is discharged from hospital.  The above assessment, treatment plan, treatment alternatives and goals were discussed and mutually agreed upon: by patient  Lorie Phenix 11/22/2021, 11:38 PM

## 2021-11-22 NOTE — Progress Notes (Signed)
Inpatient Rehabilitation Discharge Medication Review by a Pharmacist  A complete drug regimen review was completed for this patient to identify any potential clinically significant medication issues.  High Risk Drug Classes Is patient taking? Indication by Medication  Antipsychotic Yes Compazine- N/V  Anticoagulant Yes Heparin- VTE prophylaxis  Antibiotic No   Opioid Yes OxyIR- acute pain  Antiplatelet No   Hypoglycemics/insulin No   Vasoactive Medication Yes Coreg, Norvasc, Cozaar- Hypertension  Chemotherapy No   Other Yes Lipitor- HLD Protonix- GERD Trazodone- sleep     Type of Medication Issue Identified Description of Issue Recommendation(s)  Drug Interaction(s) (clinically significant)     Duplicate Therapy     Allergy     No Medication Administration End Date     Incorrect Dose     Additional Drug Therapy Needed     Significant med changes from prior encounter (inform family/care partners about these prior to discharge).    Other       Clinically significant medication issues were identified that warrant physician communication and completion of prescribed/recommended actions by midnight of the next day:  No  Time spent performing this drug regimen review (minutes):  30   Pepper Wyndham BS, PharmD, BCPS Clinical Pharmacist 11/22/2021 12:52 PM

## 2021-11-22 NOTE — Plan of Care (Signed)
°  Problem: RH Balance Goal: LTG Patient will maintain dynamic sitting balance (PT) Description: LTG:  Patient will maintain dynamic sitting balance with assistance during mobility activities (PT) Flowsheets (Taken 11/22/2021 2329) LTG: Pt will maintain dynamic sitting balance during mobility activities with:: Independent with assistive device  Goal: LTG Patient will maintain dynamic standing balance (PT) Description: LTG:  Patient will maintain dynamic standing balance with assistance during mobility activities (PT) Flowsheets (Taken 11/22/2021 2329) LTG: Pt will maintain dynamic standing balance during mobility activities with:: Supervision/Verbal cueing   Problem: RH Bed Mobility Goal: LTG Patient will perform bed mobility with assist (PT) Description: LTG: Patient will perform bed mobility with assistance, with/without cues (PT). Flowsheets (Taken 11/22/2021 2329) LTG: Pt will perform bed mobility with assistance level of: Independent with assistive device    Problem: RH Bed to Chair Transfers Goal: LTG Patient will perform bed/chair transfers w/assist (PT) Description: LTG: Patient will perform bed to chair transfers with assistance (PT). Flowsheets (Taken 11/22/2021 2329) LTG: Pt will perform Bed to Chair Transfers with assistance level: Supervision/Verbal cueing   Problem: RH Furniture Transfers Goal: LTG Patient will perform furniture transfers w/assist (OT/PT) Description: LTG: Patient will perform furniture transfers  with assistance (OT/PT). Flowsheets (Taken 11/22/2021 2329) LTG: Pt will perform furniture transfers with assist:: Supervision/Verbal cueing   Problem: RH Ambulation Goal: LTG Patient will ambulate in controlled environment (PT) Description: LTG: Patient will ambulate in a controlled environment, # of feet with assistance (PT). Flowsheets (Taken 11/22/2021 2329) LTG: Pt will ambulate in controlled environ  assist needed:: Supervision/Verbal cueing LTG: Ambulation distance  in controlled environment: 158ft with LRAD Goal: LTG Patient will ambulate in home environment (PT) Description: LTG: Patient will ambulate in home environment, # of feet with assistance (PT). Flowsheets (Taken 11/22/2021 2329) LTG: Ambulation distance in home environment: 107ft with LRAD   Problem: RH Stairs Goal: LTG Patient will ambulate up and down stairs w/assist (PT) Description: LTG: Patient will ambulate up and down # of stairs with assistance (PT) Flowsheets (Taken 11/22/2021 2329) LTG: Pt will ambulate up/down stairs assist needed:: Minimal Assistance - Patient > 75% LTG: Pt will  ambulate up and down number of stairs: 4 steps with LRAD

## 2021-11-22 NOTE — Evaluation (Signed)
Occupational Therapy Assessment and Plan  Patient Details  Name: Darlene Griffith MRN: [id number] Date of Birth: 1951/06/30  OT Diagnosis: abnormal posture, acute pain, cognitive deficits, muscle weakness (generalized), pain in joint, and swelling of limb Rehab Potential: Rehab Potential (ACUTE ONLY): Fair ELOS: 7-10 days   Today's Date: 11/22/2021 OT Individual Time: 0800-0910 OT Individual Time Calculation (min): 70 min     Hospital Problem: Principal Problem:   Aortic dissection [facility])   Past Medical History:  Past Medical History:  Diagnosis Date   Chest pain    a. 01/2015 Echo: Nl LV fxn, Gr 1 DD, triv AI, mild MR.   Essential hypertension    Family history of lung cancer    Hepatic cyst    a. noted on CT 01/2015.   Hyperthyroidism    GOING TO [facility] FOR SECOND OPINION   Multinodular goiter    a. 01/2015 CT chest: multinodular goidter w/ substernal extension of the left lobe of the thyroid assoc w/ rightward deviation of tracheal air column.   Neck pain, chronic    Personal history of colonic polyps    Pulmonary nodules    a. 01/2015 CT Chest: RLL ~ 23mm subpleural nodule - rec f/u in 6-12 mos.   Splenic cyst    a. noted on CT 01/2015.   Past Surgical History:  Past Surgical History:  Procedure Laterality Date   BYPASS GRAFT AORTA TO AORTA Left 11/11/2021   Procedure: BYPASS GRAFT AORTA TO LEFT COMMON  FEMORAL ARTERY;  Surgeon: Cherre Robins, MD;  Location: [facility];  Service: Vascular;  Laterality: Left;   CERVICAL DISC ARTHROPLASTY N/A 07/05/2019   Procedure: Cervical Six-Seven Artificial disc replacement;  Surgeon: Kristeen Miss, MD;  Location: [facility];  Service: Neurosurgery;  Laterality: N/A;  Cervical Six-Seven Artificial disc replacement   CESAREAN SECTION     COLONOSCOPY  01/13/2020   THORACIC AORTIC ENDOVASCULAR STENT GRAFT N/A 11/11/2021   Procedure: THORACIC AORTIC ENDOVASCULAR STENT GRAFT WITH LEFT BRACHIAL  ARTERY ACCESS;  Surgeon: Cherre Robins, MD;  Location: [facility]  OR;  Service: Vascular;  Laterality: N/A;   TONSILLECTOMY     AROUND 5-6 YRS OLD   ULTRASOUND GUIDANCE FOR VASCULAR ACCESS Bilateral 11/11/2021   Procedure: ULTRASOUND GUIDANCE FOR VASCULAR ACCESS;  Surgeon: Cherre Robins, MD;  Location: [facility] OR;  Service: Vascular;  Laterality: Bilateral;   UPPER GASTROINTESTINAL ENDOSCOPY  01/13/2020    Assessment & Plan Clinical Impression:   Darlene Griffith is a 71 year old female who presented to the emergency department with acute onset of the upper thoracic region pain radiating circumferentially around her chest. This was approximately 1 hour to presentation on 11/06/2021.  Past medical history significant for hypertension on Norvasc and Cozaar. Imaging revealed Type B aortic dissection extending into the infra-renal aorta and right common iliac artery. Vascular consultation obtained by Dr. Stanford Breed. Admitted to CCU by CCM. Esmolol started for hypertension but changed to labetalol for better efficacy. ECG with sinus rhythm and troponin and BNP were within normal limits.  Follow-up CTA of the chest performed on 1/20 with stable appearance of dissection.  Despite medical management, she continued to have persistent 10 out of 10 mid scapular back pain radiating to her chest and abdomen.  On 1/23 she was taken to the operating room and underwent thoracic branched endovascular aortic repair with a left common and external iliac stenting, left common iliac to common femoral artery bypass with PTFE graft.  This was performed through bilateral common femoral  artery access and left brachial artery exposure percutaneous access.  During the procedure, a rapid drop in blood pressure occurred and angiogram confirmed complete disruption of the external iliac artery.  Endovascular repair was attempted and the patient's blood pressure continued further decline and CPR was initiated which restored a perfusion pressure.  ROSC was achieved within 3 minutes.  Rapid exposure of the  infrarenal aorta was achieved to the left retroperitoneal exposure.  A surgical drain was left to the retroperitoneal space.  At completion of the case, Doppler flow was confirmed in the left radial artery and bilateral posterior tibial arteries.  She remained intubated and observed in the ICU.  She required Cardene infusion on 1/20 for hypertension follow-up CTA was performed on 1/24 which revealed possible pneumonia and cefepime initiated.  She was extubated on 1/24 and continued observation in the ICU.  Her nasogastric tube was discontinued on 1/28 and she was tolerating small amounts of p.o. intake.  Cefepime was stopped on 1/30.  Her hemoglobin remained stable at 9.2. The patient requires inpatient medicine and rehabilitation evaluations and services for ongoing dysfunction secondary to aortic dissection repair complicated by brief cardiac arrest and acute blood loss anemia, intensive care monitoring. Patient transferred to CIR on 11/21/2021 .    Patient currently requires mod-max A with basic self-care skills secondary to muscle weakness and muscle joint tightness, decreased cardiorespiratoy endurance, impaired timing and sequencing, decreased coordination, and decreased motor planning, decreased attention, decreased awareness, decreased problem solving, decreased safety awareness, decreased memory, and delayed processing, and decreased sitting balance, decreased standing balance, and decreased balance strategies.  Prior to hospitalization, patient could complete all self-care independently per pt report.  Patient will benefit from skilled intervention to decrease level of assist with basic self-care skills and increase independence with basic self-care skills prior to discharge home with care partner.  Anticipate patient will require 24 hour supervision and follow up home health.  OT - End of Session Activity Tolerance: Tolerates 10 - 20 min activity with multiple rests (mostly limited by  pain) Endurance Deficit: Yes Endurance Deficit Description: required rest breaks during bathing/dressing OT Assessment Rehab Potential (ACUTE ONLY): Fair OT Barriers to Discharge: Home environment access/layout;Behavior OT Patient demonstrates impairments in the following area(s): Balance;Safety;Cognition;Edema;Endurance;Motor;Pain OT Basic ADL's Functional Problem(s): Grooming;Bathing;Dressing;Toileting OT Advanced ADL's Functional Problem(s): Simple Meal Preparation OT Transfers Functional Problem(s): Toilet;Tub/Shower OT Plan OT Intensity: Minimum of 1-2 x/day, 45 to 90 minutes OT Frequency: 5 out of 7 days OT Duration/Estimated Length of Stay: 7-10 days OT Treatment/Interventions: Balance/vestibular training;Discharge planning;Pain management;Self Care/advanced ADL retraining;Therapeutic Activities;Disease mangement/prevention;Patient/family education;Functional mobility training;Therapeutic Exercise;Cognitive remediation/compensation;DME/adaptive equipment instruction;Psychosocial support;UE/LE Strength taining/ROM;Wheelchair propulsion/positioning OT Self Feeding Anticipated Outcome(s): mod I OT Basic Self-Care Anticipated Outcome(s): Supervision OT Toileting Anticipated Outcome(s): Supervision OT Bathroom Transfers Anticipated Outcome(s): Supervision OT Recommendation Patient destination: Home Follow Up Recommendations: Home health OT;24 hour supervision/assistance Equipment Recommended: To be determined   OT Evaluation Precautions/Restrictions  Precautions Precautions: Fall;Other (comment) Precaution Comments: Keep SBP <160 Restrictions Weight Bearing Restrictions: No Home Living/Prior Functioning Home Living Family/patient expects to be discharged to:: Private residence Living Arrangements: Alone Available Help at Discharge: Family, Available 24 hours/day Type of Home: House Home Access: Stairs to enter [employer] of Steps: 2 Entrance Stairs-Rails:  None Home Layout: One level Bathroom Shower/Tub: Chiropodist: Standard Additional Comments: difficult to determine due to no family member present and pt with tangential/impaired cognition  Lives With: Alone IADL History Homemaking Responsibilities: Yes Meal Prep Responsibility: Primary Current License: Yes (  per pt, but unsure of accuracy) Occupation: Part time employment IADL Comments: difficult to determine due to no family member present and pt with tangential/impaired cognition Prior Function Level of Independence: Independent with basic ADLs, Independent with homemaking with ambulation, Requires assistive device for independence (occassional use of rollator)  Able to Take Stairs?: Yes Driving: Yes Vocation: Part time employment Vision Baseline Vision/History: 1 Wears glasses (reading) Ability to See in Adequate Light: 0 Adequate Patient Visual Report: No change from baseline Additional Comments: pt keeping eyes closed throughout session but able to perform scanning and saccades without difficulty Perception  Perception: Within Functional Limits Praxis Praxis: Intact Cognition Overall Cognitive Status: Impaired/Different from baseline Arousal/Alertness: Awake/alert Orientation Level: Person;Place;Situation Person: Oriented Place: Oriented Situation: Oriented Year: 2023 Month: February Day of Week: Incorrect Memory: Impaired Memory Recall Sock: Not able to recall Memory Recall Blue: Not able to recall Memory Recall Bed: Not able to recall Awareness: Impaired Problem Solving: Impaired Safety/Judgment: Impaired Sensation Sensation Light Touch: Appears Intact Proprioception: Appears Intact Additional Comments: reports mild parasthesia on the RLE but able to detect all stimuli to BLE Coordination Gross Motor Movements are Fluid and Coordinated: Yes Fine Motor Movements are Fluid and Coordinated: Yes Finger Nose Finger Test: [facility] bilaterally Heel  Shin Test: limited ROM due to pain with movement. Motor  Motor Motor: Other (comment) Motor - Skilled Clinical Observations: generalized deconditioning from prolonged hospital stay  Trunk/Postural Assessment  Cervical Assessment Cervical Assessment: Exceptions to [facility] (forward head) Thoracic Assessment Thoracic Assessment: Exceptions to [facility] (kyphosis) Lumbar Assessment Lumbar Assessment: Exceptions to [facility] (posterior pelvic tilt when sitting EOB) Postural Control Postural Control: Within Functional Limits  Balance Balance Balance Assessed: Yes Static Sitting Balance Static Sitting - Balance Support: Feet supported;No upper extremity supported Static Sitting - Level of Assistance: 5: Stand by assistance (supervision) Dynamic Sitting Balance Dynamic Sitting - Balance Support: Feet supported;No upper extremity supported;During functional activity Dynamic Sitting - Level of Assistance: 5: Stand by assistance (CGA) Static Standing Balance Static Standing - Balance Support: During functional activity;Bilateral upper extremity supported Static Standing - Level of Assistance: 5: Stand by assistance (Supervision, RW) Dynamic Standing Balance Dynamic Standing - Balance Support: Bilateral upper extremity supported;During functional activity Dynamic Standing - Level of Assistance: 5: Stand by assistance (CGA, RW) Extremity/Trunk Assessment RUE Assessment RUE Assessment: Within Functional Limits General Strength Comments: WFL during self-care tasks LUE Assessment LUE Assessment: Within Functional Limits General Strength Comments: WFL during self-care tasks  Care Tool Care Tool Self Care Eating   Eating Assist Level: Set up assist    Oral Care  Oral care, brush teeth, clean dentures activity did not occur: Refused (doesn't have teeth and no dentures available)      Bathing   Body parts bathed by patient: Right arm;Left arm;Chest;Abdomen;Right upper leg;Left upper leg;Face Body parts  bathed by helper: Right lower leg;Left lower leg;Front perineal area;Buttocks   Assist Level: Moderate Assistance - Patient 50 - 74%    Upper Body Dressing(including orthotics)   What is the patient wearing?: Pull over shirt   Assist Level: Supervision/Verbal cueing    Lower Body Dressing (excluding footwear)   What is the patient wearing?: Incontinence brief;Pants Assist for lower body dressing: Maximal Assistance - Patient 25 - 49%    Putting on/Taking off footwear   What is the patient wearing?: Non-skid slipper socks Assist for footwear: Total Assistance - Patient < 25%       Care Tool Toileting Toileting activity   Assist for toileting: Maximal Assistance -  Patient 25 - 49%     Care Tool Bed Mobility Roll left and right activity   Roll left and right assist level: Supervision/Verbal cueing    Sit to lying activity   Sit to lying assist level: Supervision/Verbal cueing    Lying to sitting on side of bed activity         Care Tool Transfers Sit to stand transfer        Chair/bed transfer         Toilet transfer   Assist Level: Minimal Assistance - Patient > 75%     Care Tool Cognition  Expression of Ideas and Wants Expression of Ideas and Wants: 4. Without difficulty (complex and basic) - expresses complex messages without difficulty and with speech that is clear and easy to understand  Understanding Verbal and Non-Verbal Content Understanding Verbal and Non-Verbal Content: 3. Usually understands - understands most conversations, but misses some part/intent of message. Requires cues at times to understand   Memory/Recall Ability Memory/Recall Ability : Current season;That he or she is in a hospital/hospital unit   Refer to Care Plan for [facility] OT Short Term Goal 1 (Week 1): STG = LTG due to ELOS  Recommendations for other services: None    Skilled Therapeutic Intervention Skilled OT intervention completed with discussion  on POC, rehab goals and explanation of OT purpose. Pt received supine in bed, agreeable to session. Pt reporting >10 pain in L side with RN in room and aware providing meds. Note- pt very tangential with cues needed for re-direction, pt only able to follow one step-commands and increased time needed for motor planning and sequencing functional tasks/transfers throughout session. Pt completed bed mobility, very slow and HOB at 90 degrees, but with min A for managing BLEs <> the EOB. While seated EOB, pt able to complete bathing with mod A, dress UB with supervision, dress LB with max A. Pt completed sit > stand and stand pivot transfers with min A, at first without AD but pt reporting feeling unsteady so completed more towards CGA level with RW. Pt with continent void only episode on BSC, with max A needed for toileting at the sit > stand level with CGA needed for balance. See caretool for further details on assist level with self-care tasks performed. Pt left upright in bed, with bed alarm on and all needs in reach at end of session.    ADL ADL Eating: Set up Where Assessed-Eating: Bed level Grooming: Setup Where Assessed-Grooming: Bed level Upper Body Bathing: Supervision/safety Where Assessed-Upper Body Bathing: Edge of bed Lower Body Bathing: Moderate assistance Where Assessed-Lower Body Bathing: Edge of bed Upper Body Dressing: Supervision/safety Where Assessed-Upper Body Dressing: Edge of bed Lower Body Dressing: Maximal assistance Where Assessed-Lower Body Dressing: Edge of bed Toileting: Maximal assistance Where Assessed-Toileting: Bedside Commode Toilet Transfer: Minimal assistance Toilet Transfer Method: Stand pivot (RW) Science writer: Radiographer, therapeutic: Unable to assess Social research officer, government: Unable to assess Mobility  Bed Mobility Bed Mobility: Rolling Right;Rolling Left Rolling Right: Maximal Assistance - Patient 25-49% (partial roll) Rolling  Left: Maximal Assistance - Patient 25-49% (partial roll)   Discharge Criteria: Patient will be discharged from OT if patient refuses treatment 3 consecutive times without medical reason, if treatment goals not met, if there is a change in medical status, if patient makes no progress towards goals or if patient is discharged from hospital.  The above assessment, treatment plan, treatment alternatives and  goals were discussed and mutually agreed upon: by patient  Blase Mess 11/22/2021, 9:17 AM

## 2021-11-22 NOTE — Plan of Care (Signed)
°  Problem: RH Cognition - SLP Goal: RH LTG Patient will demonstrate orientation with cues Description:  LTG:  Patient will demonstrate orientation to person/place/time/situation with cues (SLP)   Flowsheets (Taken 11/22/2021 1613) LTG Patient will demonstrate orientation to:  Person  Place  Time  Situation LTG: Patient will demonstrate orientation using cueing (SLP): Modified Independent   Problem: RH Memory Goal: LTG Patient will demonstrate ability for day to day (SLP) Description: LTG:   Patient will demonstrate ability for day to day recall/carryover during cognitive/linguistic activities with assist  (SLP) Flowsheets (Taken 11/22/2021 1613) LTG: Patient will demonstrate ability for day to day recall/carryover during cognitive/linguistic activities with assist (SLP): Supervision Goal: LTG Patient will use memory compensatory aids to (SLP) Description: LTG:  Patient will use memory compensatory aids to recall biographical/new, daily complex information with cues (SLP) Flowsheets (Taken 11/22/2021 1613) LTG: Patient will use memory compensatory aids to (SLP): Supervision   Problem: RH Attention Goal: LTG Patient will demonstrate this level of attention during functional activites (SLP) Description: LTG:  Patient will will demonstrate this level of attention during functional activites (SLP) Flowsheets (Taken 11/22/2021 1613) Patient will demonstrate during cognitive/linguistic activities the attention type of: Selective Patient will demonstrate this level of attention during cognitive/linguistic activities in: Controlled LTG: Patient will demonstrate this level of attention during cognitive/linguistic activities with assistance of (SLP): Supervision   Problem: RH Awareness Goal: LTG: Patient will demonstrate awareness during functional activites type of (SLP) Description: LTG: Patient will demonstrate awareness during functional activites type of (SLP) Flowsheets (Taken 11/22/2021  1613) Patient will demonstrate during cognitive/linguistic activities awareness type of: Emergent LTG: Patient will demonstrate awareness during cognitive/linguistic activities with assistance of (SLP): Supervision

## 2021-11-22 NOTE — Progress Notes (Signed)
PROGRESS NOTE   Subjective/Complaints: Feeling horrible today Having worst pain imaginable from her breast to her abdomen. Has not had a BM since surgery, No results with milk of mag  ROS: +abdominal pain  Objective:   CT HEAD WO CONTRAST (5MM)  Result Date: 11/20/2021 CLINICAL DATA:  Altered mental status EXAM: CT HEAD WITHOUT CONTRAST TECHNIQUE: Contiguous axial images were obtained from the base of the skull through the vertex without intravenous contrast. RADIATION DOSE REDUCTION: This exam was performed according to the departmental dose-optimization program which includes automated exposure control, adjustment of the mA and/or kV according to patient size and/or use of iterative reconstruction technique. COMPARISON:  CT head 08/16/2018 FINDINGS: Brain: No acute intracranial hemorrhage, mass effect, or herniation. No extra-axial fluid collections. No evidence of acute territorial infarct. No hydrocephalus. Small old lacunar infarcts in the right basal ganglia. Patchy hypodensities in the periventricular and subcortical white matter, likely secondary to chronic microvascular ischemic changes. Vascular: No hyperdense vessel or unexpected calcification. Skull: Normal. Negative for fracture or focal lesion. Sinuses/Orbits: No acute finding. Other: None. IMPRESSION: Chronic changes with no acute intracranial process identified. Electronically Signed   By: Ofilia Neas M.D.   On: 11/20/2021 13:45   Recent Labs    11/22/21 0639  WBC 10.7*  HGB 9.2*  HCT 29.0*  PLT 111*   Recent Labs    11/22/21 0639  NA 137  K 3.8  CL 101  CO2 27  GLUCOSE 90  BUN 13  CREATININE 0.47  CALCIUM 8.6*    Intake/Output Summary (Last 24 hours) at 11/22/2021 1142 Last data filed at 11/22/2021 0700 Gross per 24 hour  Intake 236 ml  Output --  Net 236 ml        Physical Exam: Vital Signs Blood pressure 135/60, pulse 70, temperature 98.3 F  (36.8 C), temperature source Oral, resp. rate 18, height 5' 0.98" (1.549 m), weight 68.6 kg, SpO2 97 %. Gen: no distress, normal appearing HEENT: oral mucosa pink and moist, NCAT Cardio: Reg rate Chest: normal effort, normal rate of breathing Abd: soft, non-distended Musculoskeletal:        General: No swelling or deformity.     Cervical back: Neck supple. No tenderness.     Comments: 4+/5 in Biceps, triceps, WE, grip and FA B/L  4+/5 in HF/KE/DF and PF B/L- Poor effort vs weakness- hard to determine  Skin:    Comments: L abdominal incision from L of umbilicus to LLQ- staples intact- no drainage; a little puffy L groin /inguinal incision- from Mid inguinal to pubis- slightly moist- staples intact- no drainage seen- no erythema on either.    Neurological:     Mental Status: She is alert.     Comments: Pleasantly confused; vague, tangential- Ox1-2  Psychiatric:        Mood and Affect: Mood normal.        Behavior: Behavior normal.    Assessment/Plan: 1. Functional deficits which require 3+ hours per day of interdisciplinary therapy in a comprehensive inpatient rehab setting. Physiatrist is providing close team supervision and 24 hour management of active medical problems listed below. Physiatrist and rehab team continue to assess barriers to discharge/monitor  patient progress toward functional and medical goals  Care Tool:  Bathing    Body parts bathed by patient: Right arm, Left arm, Chest, Abdomen, Right upper leg, Left upper leg, Face   Body parts bathed by helper: Right lower leg, Left lower leg, Front perineal area, Buttocks     Bathing assist Assist Level: Moderate Assistance - Patient 50 - 74%     Upper Body Dressing/Undressing Upper body dressing   What is the patient wearing?: Pull over shirt    Upper body assist Assist Level: Supervision/Verbal cueing    Lower Body Dressing/Undressing Lower body dressing      What is the patient wearing?: Incontinence  brief, Pants     Lower body assist Assist for lower body dressing: Maximal Assistance - Patient 25 - 49%     Toileting Toileting    Toileting assist Assist for toileting: Maximal Assistance - Patient 25 - 49%     Transfers Chair/bed transfer  Transfers assist  Chair/bed transfer activity did not occur: Safety/medical concerns        Locomotion Ambulation   Ambulation assist              Walk 10 feet activity   Assist           Walk 50 feet activity   Assist           Walk 150 feet activity   Assist           Walk 10 feet on uneven surface  activity   Assist           Wheelchair     Assist               Wheelchair 50 feet with 2 turns activity    Assist            Wheelchair 150 feet activity     Assist          Blood pressure 135/60, pulse 70, temperature 98.3 F (36.8 C), temperature source Oral, resp. rate 18, height 5' 0.98" (1.549 m), weight 68.6 kg, SpO2 97 %.  Medical Problem List and Plan: 1. Functional deficits/debility secondary to aortic dissection -pt was very clear she panics when told "serious things"-              -patient may  shower- if covers incisions             -ELOS/Goals: 12-14 days- supervision  HFU scheduled.  2.  Antithrombotics: -DVT/anticoagulation:  Pharmaceutical: Heparin [place] q 8 hours             -antiplatelet therapy: n/a 3. Pain Management: Tylenol, Neurontin, Robaxin, oxycodone             -family said don't want her to have robaxin/gabapentin- will change to skelaxin and d/c gabapentin 4. Mood: LCSW to evaluate and provide emotional support             -antipsychotic agents: n/a 5. Neuropsych: This patient is capable of making decisions on her own behalf. 6. Skin/Wound Care: Routine skin care checks             -- Monitor LLQ, left groin incision 7. Fluids/Electrolytes/Nutrition: Routine ins and outs and follow-up chemistries             -- Heart healthy diet,  thin liquids             --Glucose has been in normal range; DC SSI and follow-up BMET 8. Blood  loss anemia: Monitor CBC 9. Hypertension: continue Coreg, Norvasc, Cozaar 10. Hyperlipidemia: continue Lipitor 11: Tobacco use: Provide cessation counseling             --Nicotine patch ?             --Continue pulmonary toilet 12: COPD: Continue Yupelri nebulizer daily 13: GERD: continue Protonix 14: Hyperthyroidismmulti-nodular goiter:  --Dr. Loanne Drilling, endocrinologist>>not on methimazole currently --TSH = 0.022 1/20, normal T3, T4 this admission 15: History of pulmonary nodules: stable in size of 5 mm nodule on 1/18 as compared to 2016, outpatient follow-up.  16. Constipation: enema ordered.  17. Feels cold- will order kpad for feeling cold/pain 18. Abdominal pain: KUB ordered     LOS: 1 days A FACE TO FACE EVALUATION WAS PERFORMED  Darlene Griffith 11/22/2021, 11:42 AM

## 2021-11-22 NOTE — Discharge Summary (Addendum)
Physician Discharge Summary  Patient ID: Darlene Griffith MRN: [id number] DOB/AGE: [birth year] 71 y.o.  Admit date: 11/21/2021 Discharge date: 12/03/2021  Discharge Diagnoses:  Principal Problem:   Aortic dissection (HCC) Disability secondary to repair aortic dissection Active Problems: Disability secondary to repair aortic dissection Acute blood loss anemia Hypertension Hyperlipidemia Tobacco use COPD GERD Hyperthyroidism Multinodular goiter Pulmonary nodules Constipation Chest, thorax, flank pain   Discharged Condition: good, stable  Significant Diagnostic Studies: DG Chest 1 View  Addendum Date: 11/11/2021   ADDENDUM REPORT: 11/11/2021 13:23 ADDENDUM: Findings were discussed with the operating room nurse at the initial time of dictation approximately 1:13 p.m. Electronically Signed   By: Valetta Mole M.D.   On: 11/11/2021 13:23   Result Date: 11/11/2021 CLINICAL DATA:  Status post thoracic aortic endograft, concern for retained foreign body EXAM: CHEST  1 VIEW COMPARISON:  CT a chest 11/08/2021 cough chest radiograph 11/06/2021 FINDINGS: Endotracheal tube tip terminates in the midthoracic trachea. A right IJ vascular sheath terminates in the lower SVC. There has been interval thoracic aortic dissection repair with endograft material extending from the ascending aorta through the imaged descending aorta. Tubular graft material is also noted about the aortic arch. There is no unexpected retained foreign body. The cardiomediastinal silhouette is stable There is vascular congestion without overt pulmonary edema. There is no focal consolidation. There is no significant pleural effusion. There is no acute osseous abnormality. Cervical spine hardware is noted. IMPRESSION: Status post repair of a thoracic aortic aneurysm. No definite unexpected retained foreign body. Electronically Signed: By: Valetta Mole M.D. On: 11/11/2021 13:13   DG Chest 2 View  Result Date: 11/19/2021 CLINICAL DATA:   Chest pain, generalized pain EXAM: CHEST - 2 VIEW COMPARISON:  11/12/2021 FINDINGS: No focal consolidation. No pleural effusion or pneumothorax. Heart and mediastinal contours are unremarkable. Thoracic aortic stent noted. No acute osseous abnormality. IMPRESSION: No active cardiopulmonary disease. Electronically Signed   By: Kathreen Devoid M.D.   On: 11/19/2021 09:38   DG Abd 1 View  Result Date: 11/12/2021 CLINICAL DATA:  Encounter for NGT placement. EXAM: ABDOMEN - 1 VIEW COMPARISON:  Abdomen film yesterday at 7:04 p.m. FINDINGS: Portable abdomen flat plate single-view, 80:32 p.m. on 11/12/2021. Stent grafting of the thoracic aorta is again partially visible with dense left lower lobe consolidation and small left pleural effusion. NGT is in place with the tip either in the most distal stomach or in the proximal descending duodenum, appears slightly advanced compared with yesterday's exam. The bowel pattern is nonobstructive. There is no supine evidence of free air although the true pelvis is not excluded in the scan. No pathologic calcification is seen. There are skin staples extending horizontally across the left lower abdomen, possibly related to recent aortic to left common femoral bypass grafting and not seen yesterday. Visceral shadows are stable. IMPRESSION: 1. NGT tip is either in the most distal stomach or in the proximal descending duodenum. 2. Left lower lobe consolidation and small left pleural effusion. 3. Skin staples are newly visualized extending horizontally across the left lower abdomen. Electronically Signed   By: Telford Nab M.D.   On: 11/12/2021 23:02   CT HEAD WO CONTRAST (5MM)  Result Date: 12/02/2021 CLINICAL DATA:  Mental status changes EXAM: CT HEAD WITHOUT CONTRAST TECHNIQUE: Contiguous axial images were obtained from the base of the skull through the vertex without intravenous contrast. RADIATION DOSE REDUCTION: This exam was performed according to the departmental  dose-optimization program which includes automated exposure control, adjustment  of the mA and/or kV according to patient size and/or use of iterative reconstruction technique. COMPARISON:  11/20/2021 FINDINGS: Brain: Patchy chronic small vessel disease throughout the deep white matter. No acute intracranial abnormality. Specifically, no hemorrhage, hydrocephalus, mass lesion, acute infarction, or significant intracranial injury. Vascular: No hyperdense vessel or unexpected calcification. Skull: No acute calvarial abnormality. Sinuses/Orbits: No acute findings Other: None IMPRESSION: Chronic small vessel disease. No acute intracranial abnormality. Electronically Signed   By: Rolm Baptise M.D.   On: 12/02/2021 20:57   CT HEAD WO CONTRAST (5MM)  Result Date: 11/20/2021 CLINICAL DATA:  Altered mental status EXAM: CT HEAD WITHOUT CONTRAST TECHNIQUE: Contiguous axial images were obtained from the base of the skull through the vertex without intravenous contrast. RADIATION DOSE REDUCTION: This exam was performed according to the departmental dose-optimization program which includes automated exposure control, adjustment of the mA and/or kV according to patient size and/or use of iterative reconstruction technique. COMPARISON:  CT head 08/16/2018 FINDINGS: Brain: No acute intracranial hemorrhage, mass effect, or herniation. No extra-axial fluid collections. No evidence of acute territorial infarct. No hydrocephalus. Small old lacunar infarcts in the right basal ganglia. Patchy hypodensities in the periventricular and subcortical white matter, likely secondary to chronic microvascular ischemic changes. Vascular: No hyperdense vessel or unexpected calcification. Skull: Normal. Negative for fracture or focal lesion. Sinuses/Orbits: No acute finding. Other: None. IMPRESSION: Chronic changes with no acute intracranial process identified. Electronically Signed   By: Ofilia Neas M.D.   On: 11/20/2021 13:45   Portable  Chest xray  Result Date: 11/12/2021 CLINICAL DATA:  Respiratory failure. EXAM: PORTABLE CHEST 1 VIEW COMPARISON:  Radiographs 11/11/2021 and 11/06/2021.  CT 11/08/2021. FINDINGS: 0526 hours. Recent endograft repair of thoracic aortic dissection with stability of the heart size and mediastinal contours. There is persistent tracheal deviation to the right by an enlarged left thyroid lobe on previous CT. The endotracheal tube, right IJ sheath and enteric tube are satisfactorily positioned, the latter projecting below the diaphragm, tip not visualized. Interval improved aeration of the lungs without residual vascular congestion. There are probable small pleural effusions and mild bibasilar atelectasis. No pneumothorax. IMPRESSION: Interval improved aeration of the lungs with decreased vascular congestion. Otherwise stable postoperative chest without acute findings Electronically Signed   By: Richardean Sale M.D.   On: 11/12/2021 08:20   DG Chest Portable 1 View  Result Date: 11/06/2021 CLINICAL DATA:  Chest pain EXAM: PORTABLE CHEST 1 VIEW COMPARISON:  Chest x-ray dated August 17, 2021; chest CT dated August 31, 2020 FINDINGS: Cardiac and mediastinal contours are unchanged. Rightward deviation of the trachea is unchanged compared to multiple priors and due to large thyroid goiter which extends into the upper mediastinum when compared with prior chest CT. Lungs are clear. No large pleural effusion. IMPRESSION: 1. No active disease. 2. Unchanged rightward deviation of the trachea which is due to a large thyroid goiter. Electronically Signed   By: Yetta Glassman M.D.   On: 11/06/2021 09:31   DG Abd Portable 1V  Result Date: 11/11/2021 CLINICAL DATA:  NG tube placement. EXAM: PORTABLE ABDOMEN - 1 VIEW COMPARISON:  Operative localization films earlier today. FINDINGS: Tip and side port of the enteric tube are below the diaphragm in the stomach. Thoracic aortic stent graft is partially visualized. IMPRESSION:  Tip and side port of the enteric tube below the diaphragm in the stomach. Electronically Signed   By: Keith Rake M.D.   On: 11/11/2021 19:27   CT Angio Chest/Abd/Pel for Dissection W and/or  W/WO  Result Date: 11/28/2021 CLINICAL DATA:  Follow-up aortic dissection treated with a thoracic aortic stent graft. EXAM: CT ANGIOGRAPHY CHEST, ABDOMEN AND PELVIS TECHNIQUE: Non-contrast CT of the chest was initially obtained. Multidetector CT imaging through the chest, abdomen and pelvis was performed using the standard protocol during bolus administration of intravenous contrast. Multiplanar reconstructed images and MIPs were obtained and reviewed to evaluate the vascular anatomy. RADIATION DOSE REDUCTION: This exam was performed according to the departmental dose-optimization program which includes automated exposure control, adjustment of the mA and/or kV according to patient size and/or use of iterative reconstruction technique. CONTRAST:  147mL OMNIPAQUE IOHEXOL 350 MG/ML SOLN COMPARISON:  11/13/2021 FINDINGS: CTA CHEST FINDINGS Cardiovascular: The thoracic aortic stent graft extends from the origins of the innominate and left common carotid arteries, into the proximal left subclavian artery and through the arch and descending thoracic aorta to the level of the upper abdomen. All of these are normally opacified. The compressed false lumen is not thrombosed throughout the chest. Normal sized heart. Mediastinum/Nodes: Again demonstrated is diffuse enlargement and heterogeneity of the thyroid gland containing multiple nodules and with a large substernal component. This continues to deviate the trachea to the right without significant tracheal compression. There is also deviation of the esophagus to the right with compression of the esophagus. No enlarged lymph nodes. Lungs/Pleura: The previously demonstrated right upper lobe ground-glass opacity has resolved. Small left pleural effusion, decreased in size.  Resolution of the previously demonstrated small right pleural effusion. Mild bilateral dependent atelectasis, left greater than right. Musculoskeletal: Thoracic spine degenerative changes and lower cervical spine interbody hardware fusion. Review of the MIP images confirms the above findings. CTA ABDOMEN AND PELVIS FINDINGS VASCULAR Aorta: The false lumen of the aortic dissection is thrombosed inferior to the inferior extent of the aortic stent graft. This is partially opacified above the renal arteries and completely opacified at the level of the right renal artery and extending inferiorly. Celiac: Normally opacified, arising from the true lumen. SMA: Normally opacified, arising from the true lumen. Renals: The right renal artery is normally opacified, arising from the false lumen. The left renal artery is normally opacified, rising from the true lumen. IMA: Normally opacified, rising from the false lumen. Inflow: The dissection flap continues to extend in to the right common iliac artery and proximal right external iliac artery with opacification of the true and false lumens. The internal iliac artery on the right arises from the true lumen and the external iliac artery arises from both the true and false lumens. The left common iliac artery is opacified from the true lumen. The origin of the left internal iliac artery is re-opacified with a high-grade stenosis of approximately 90%. Stable normal opacification of the left internal iliac artery graft. The right common femoral, superficial femoral and deep femoral arteries have normal appearances and are normally opacified. A graft is again demonstrated extending from the left common iliac artery to the left common femoral artery with approximately 60% stenosis prior to the origins of the left femoral and deep femoral arteries which are normally opacified. Veins: No obvious venous abnormality within the limitations of this arterial phase study. Review of the MIP  images confirms the above findings. NON-VASCULAR Hepatobiliary: No significant change in multiple small liver cysts. Unremarkable gallbladder. Pancreas: Unremarkable. No pancreatic ductal dilatation or surrounding inflammatory changes. Spleen: Normal in size without focal abnormality. Adrenals/Urinary Tract: Normal appearing adrenal glands. Small bilateral renal cysts. Unremarkable urinary bladder and ureters. Stomach/Bowel: Sigmoid colon diverticulosis  without evidence of diverticulitis. Unremarkable stomach, small bowel and appendix. Lymphatic: No enlarged lymph nodes. Reproductive: Uterus and bilateral adnexa are unremarkable. Other: Skin clips in normal postoperative subcutaneous edema in the anterior abdominal wall at the level of the upper pelvis. Small amount of subcutaneous air in the right lower anterior abdominal wall compatible with recent injections. Musculoskeletal: No acute bony abnormality. Review of the MIP images confirms the above findings. IMPRESSION: 1. Thoracic aortic endovascular stent graft is normally opacified as are the great vessels. 2. Thrombosis of the false lumen of the thoracic portion of the aortic dissection to the level of the upper abdomen, above the renal arteries and inferior to the inferior extent of the stent graft. 3. The left common iliac to femoral graft remains patent. 4. 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 5. Approximately 60% stenosis of the left common femoral artery inferior to the inferior extent of the left common iliac to common femoral artery graft. 6. Resolved ground-glass opacity in the right upper lobe. 7. Small left pleural effusion, decreased in size and resolved right pleural effusion with improved bilateral dependent atelectasis. 8. Sigmoid diverticulosis. 9. Stable thyroid multinodular goiter with a large substernal extension with associated mass effect as described above. Electronically  Signed   By: Claudie Revering M.D.   On: 11/28/2021 13:15   CT Angio Chest/Abd/Pel for Dissection W and/or W/WO  Result Date: 11/13/2021 CLINICAL DATA:  71 year old female with suspected acute aortic syndrome. EXAM: CT ANGIOGRAPHY CHEST, ABDOMEN AND PELVIS TECHNIQUE: Non-contrast CT of the chest was initially obtained. Multidetector CT imaging through the chest, abdomen and pelvis was performed using the standard protocol during bolus administration of intravenous contrast. Multiplanar reconstructed images and MIPs were obtained and reviewed to evaluate the vascular anatomy. RADIATION DOSE REDUCTION: This exam was performed according to the departmental dose-optimization program which includes automated exposure control, adjustment of the mA and/or kV according to patient size and/or use of iterative reconstruction technique. CONTRAST:  190mL OMNIPAQUE IOHEXOL 350 MG/ML SOLN COMPARISON:  CTA of the chest, abdomen and pelvis dated 11/08/2021. FINDINGS: CTA CHEST FINDINGS Cardiovascular: Interval placement of a stent graft in the thoracic aorta which originates just distal to the origin of the bovine trunk (normal anatomical variant). There is also been placement of a small stent in the proximal left subclavian artery. The great vessels remain widely patent at this time. The distal end of the stent graft extends to the level of the distal descending thoracic aorta shortly above the level of the hiatus. The excluded false lumen is smaller than the prior examination and remains opacified with iodinated contrast material indicating patency. Heart size is normal. There is no significant pericardial fluid, thickening or pericardial calcification. Atherosclerotic calcifications in the left anterior descending coronary artery. Mediastinum/Nodes: No pathologically enlarged mediastinal or hilar lymph nodes. Thyroid gland is diffusely enlarged and heterogeneous in appearance, compatible with a goiter, including asymmetric  enlargement of the left lobe of the thyroid gland which demonstrates substantial substernal extension down to the level of the carina into the middle mediastinum, similar to the prior study. Esophagus is unremarkable in appearance. Feeding tube extends into the upper abdomen. No axillary lymphadenopathy. Lungs/Pleura: Trace right and moderate left pleural effusions lying dependently with areas of passive subsegmental atelectasis in the lower lobes of the lungs bilaterally. Wedge-shaped area of ground-glass attenuation in the right upper lobe near the apex (axial image 44 of series 11), new compared to the prior  study. Pulmonary artery branches extending into this region appear patent on today's study. No other acute consolidative airspace disease. No definite suspicious appearing pulmonary nodules or masses are noted elsewhere. Musculoskeletal: Interbody cage at C6-C7. There are no aggressive appearing lytic or blastic lesions noted in the visualized portions of the skeleton. Review of the MIP images confirms the above findings. CTA ABDOMEN AND PELVIS FINDINGS VASCULAR Aorta: Chronic dissection of the abdominal aorta with extension of the dissection flap into the right common iliac artery, similar to the prior examination. Both the true and false lumen remain patent. The true lumen supplies flow to the celiac axis, superior mesenteric artery bilateral renal arteries, and left common iliac artery. Inferior mesenteric artery appears likely fed by both lumens as it sits adjacent to a fenestration in the dissection flap. Right common iliac artery is fed by both lumens, with the dissection flap extending to the origin of the right internal iliac artery which appears fed by the true lumen, and the right external iliac artery which appears fed by the false lumen. Celiac: Arises from the true lumen. Patent without evidence of aneurysm, dissection, vasculitis or significant stenosis. SMA: Arises from the true lumen. Patent  without evidence of aneurysm, dissection, vasculitis or significant stenosis. Renals: Arises from the true lumen. Both renal arteries are patent without evidence of aneurysm, dissection, vasculitis, fibromuscular dysplasia or significant stenosis. IMA: Origin is adjacent to a fenestration in the dissection flap, likely fed by both the true and false lumen. Patent without evidence of aneurysm, dissection, vasculitis or significant stenosis. Inflow: Dissection flap extends into the right common iliac artery with perfusion of the right external iliac artery from the false lumen and right internal iliac artery from the true lumen. Left common iliac artery is fed by the true lumen. Mild irregularity of the left common iliac artery, presumably related to recent open vascular repair (see operative note 11/11/2021). The proximal left internal iliac artery is either severely stenosed or completely occluded, although there is flow in the distal left internal iliac artery, which may be related to collateralization or residual patency. Left external iliac graft is widely patent, as is the anastomosis with the left common femoral artery. Veins: No obvious venous abnormality within the limitations of this arterial phase study. Review of the MIP images confirms the above findings. NON-VASCULAR Hepatobiliary: Liver has a slightly shrunken appearance and nodular contour, suggesting underlying cirrhosis. No discrete cystic or solid hepatic lesions are confidently identified on today's arterial phase examination. No intra or extrahepatic biliary ductal dilatation. Gallbladder is moderately distended. No gallbladder wall thickening or surrounding pericholecystic fluid or inflammatory changes. Intermediate attenuation amorphous material lying dependently in the gallbladder may represent biliary sludge or vicarious excretion of contrast material from recent contrast enhanced examination. Pancreas: No pancreatic mass. No pancreatic ductal  dilatation. No well-defined peripancreatic fluid collections. There is mild peripancreatic stranding adjacent to the distal body and tail of the pancreas, favored to be related to left-sided perinephric fluid rather than inflammation centered around the pancreas. Spleen: Unremarkable. Adrenals/Urinary Tract: Low-attenuation lesions in the right kidney measuring up to 2.2 cm in the lower pole, compatible with simple cysts. Interval development of extensive left-sided perinephric soft tissue stranding, along with soft tissue stranding around the left proximal ureter. Bilateral adrenal glands are normal in appearance. No hydroureteronephrosis. Urinary bladder is completely decompressed around an indwelling Foley balloon catheter. Stomach/Bowel: Feeding tube extending into the proximal duodenum. Stomach is normal in appearance. No pathologic dilatation of small bowel or  colon. Numerous colonic diverticulae are noted, particularly in the sigmoid colon, without definite surrounding inflammatory changes to suggest an acute diverticulitis at this time. The appendix is not confidently identified and may be surgically absent. Regardless, there are no inflammatory changes noted adjacent to the cecum to suggest the presence of an acute appendicitis at this time. Lymphatic: No lymphadenopathy noted in the abdomen or pelvis. Reproductive: Uterus and ovaries are atrophic. Other: Intermediate attenuation material along the left pelvic sidewall tracking into the retroperitoneum bilaterally (left-greater-than-right) both cephalad and caudally, likely residual blood products given the need for open operative repair of the left external iliac artery. No significant volume of ascites. No pneumoperitoneum. Surgical staples in the skin of the left lower quadrant. Small amount of subcutaneous gas in the abdominal wall overlying the left lower quadrant. Surgical drain entering via the left lower quadrant tracking adjacent to the left  external iliac graft. Musculoskeletal: There are no aggressive appearing lytic or blastic lesions noted in the visualized portions of the skeleton. Review of the MIP images confirms the above findings. IMPRESSION: 1. Status post branched endovascular aortic repair of the thoracic aortic aneurysm/dissection, with maintained patency of the arch vessels and decreasing size of the false lumen throughout the thoracic aorta. 2. Status post open left common iliac to left common femoral artery bypass graft with small amount of resolving blood products along the left pelvic sidewall and retroperitoneum, but no large well-defined postoperative hematoma. 3. High-grade stenosis versus complete occlusion at the ostium of the left internal iliac artery. The left internal artery is patent, however, potentially fed by extensive collateral vasculature. 4. Trace right and small left pleural effusions lying dependently with extensive areas of passive subsegmental atelectasis in the lower lobes of the lungs bilaterally. 5. New wedge-shaped area of ground-glass attenuation in the right upper lobe near the apex. The pulmonary artery branches extending to this region appear patent. This is presumably of infectious or inflammatory etiology. Attention on follow-up studies is recommended to ensure resolution of this finding. 6. Thyroid goiter with substernal extension redemonstrated, as above. 7. Morphologic changes in the liver suggesting underlying cirrhosis. 8. Additional incidental findings, as above. Electronically Signed   By: Vinnie Langton M.D.   On: 11/13/2021 06:43   CT Angio Chest/Abd/Pel for Dissection W and/or W/WO  Result Date: 11/08/2021 CLINICAL DATA:  Aortic dissection, considering treatment change EXAM: CT ANGIOGRAPHY CHEST, ABDOMEN AND PELVIS TECHNIQUE: Non-contrast CT of the chest was initially obtained. Multidetector CT imaging through the chest, abdomen and pelvis was performed using the standard protocol during  bolus administration of intravenous contrast. Multiplanar reconstructed images and MIPs were obtained and reviewed to evaluate the vascular anatomy. RADIATION DOSE REDUCTION: This exam was performed according to the departmental dose-optimization program which includes automated exposure control, adjustment of the mA and/or kV according to patient size and/or use of iterative reconstruction technique. CONTRAST:  95mL OMNIPAQUE IOHEXOL 350 MG/ML SOLN COMPARISON:  11/06/2021 FINDINGS: CTA CHEST FINDINGS VASCULAR Aorta: Satisfactory opacification of the aorta. No significant change in contour or caliber of the thoracic aorta, with a descending thoracic aortic dissection arising just distally to the left subclavian artery origin (series 7, image 49) and extending into the abdominal aorta. There is again seen intramural hematoma and adjacent fat stranding about the superior aspect of the distal arch proximal to the dissection lumen (series 10, image 77). The dissected vessel is nonaneurysmal, measuring up to 2.9 x 2.9 cm in the thorax. Review of the MIP images confirms the above  findings. Cardiovascular: No evidence of pulmonary embolism on limited non-tailored examination. Normal heart size. Scattered left coronary artery calcifications. No pericardial effusion. NON VASCULAR Mediastinum/Nodes: No enlarged mediastinal, hilar, or axillary lymph nodes. Bulky thyroid goiter with a large nodule arising from the inferior pole of the left lobe and extending posteriorly to and deflecting the trachea (series 5, image 19). This has been evaluated on previous imaging. (ref: J Am Coll Radiol. 2015 Feb;12(2): 143-50). Trachea, and esophagus demonstrate no significant findings. Lungs/Pleura: Lungs are clear. New small bilateral pleural effusions and associated atelectasis or consolidation. Musculoskeletal: No chest wall abnormality. No acute osseous findings. Review of the MIP images confirms the above findings. CTA ABDOMEN AND  PELVIS FINDINGS VASCULAR No significant change in contour or caliber of abdominal aortic dissection, contiguous with thoracic aortic dissection and extending into the right common iliac artery and terminating in the right external iliac artery. The false lumen is opacified and the true lumen supplies all of the branch vessels except for the inferior mesenteric artery (series 7, image 181). The dissected vessel is nonaneurysmal, maximum caliber approximately 2.8 x 2.5 cm near the hiatus. Standard branching pattern of the abdominal aorta with solitary bilateral renal arteries. Review of the MIP images confirms the above findings. NON-VASCULAR Hepatobiliary: No solid liver abnormality is seen. No gallstones, gallbladder wall thickening, or biliary dilatation. Pancreas: Unremarkable. No pancreatic ductal dilatation or surrounding inflammatory changes. Spleen: Normal in size without significant abnormality. Adrenals/Urinary Tract: Adrenal glands are unremarkable. Kidneys are normal, without renal calculi, solid lesion, or hydronephrosis. Bladder is unremarkable. Stomach/Bowel: Stomach is within normal limits. Appendix not clearly visualized. No evidence of bowel wall thickening, distention, or inflammatory changes. Descending and sigmoid diverticulosis. Lymphatic: No enlarged abdominal or pelvic lymph nodes. Reproductive: No mass or other significant abnormality. Other: No abdominal wall hernia or abnormality. No ascites. Musculoskeletal: No acute osseous findings. IMPRESSION: 1. No significant change in contour or caliber of a descending thoracoabdominal aortic dissection, arising just distally to the left subclavian artery origin and extending into the abdominal aorta, right common iliac artery, and terminating in the right external iliac artery. The dissected vessel remains nonaneurysmal, measuring up to 2.9 x 2.9 cm in the thorax. 2. The false lumen remains opacified. True lumen supplies all of the branch vessels  except for the inferior mesenteric artery, which is opacified. 3. No evidence of abdominal organ ischemia. 4. New small bilateral pleural effusions and associated atelectasis or consolidation. Electronically Signed   By: Delanna Ahmadi M.D.   On: 11/08/2021 12:05   CT Angio Chest/Abd/Pel for Dissection W and/or Wo Contrast  Result Date: 11/06/2021 CLINICAL DATA:  Evaluate for aortic dissection. EXAM: CT ANGIOGRAPHY CHEST, ABDOMEN AND PELVIS TECHNIQUE: Non-contrast CT of the chest was initially obtained. Multidetector CT imaging through the chest, abdomen and pelvis was performed using the standard protocol during bolus administration of intravenous contrast. Multiplanar reconstructed images and MIPs were obtained and reviewed to evaluate the vascular anatomy. RADIATION DOSE REDUCTION: This exam was performed according to the departmental dose-optimization program which includes automated exposure control, adjustment of the mA and/or kV according to patient size and/or use of iterative reconstruction technique. 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 The dissection flap originates at the origin of the left subclavian artery and extends into the infrarenal abdominal aorta and right common iliac artery and terminates just beyond the bifurcation, image  196/5. The vessels originating off the arch are supplied by the true lumen. There is contrast identified within both the true and false lumen with mixing of contrast noted in the false lumen proximally. Small intramural hematoma involving the aortic arch and descending thoracic aorta is also noted which measures 7 mm, image 23/6. The heart size appears normal. The thoracic aorta has a normal caliber. The ascending aorta measures 3 cm, image 56/9. The transverse aortic arch measures 2.9 cm. Aortic  atherosclerotic calcifications and coronary artery calcifications noted. The main pulmonary artery and its proximal branches appear patent. Mediastinum/Nodes: Enlarged and heterogeneous thyroid gland with substernal extension into the mediastinum is identified. This has been evaluated on previous imaging. (ref: J Am Coll Radiol. 2015 Feb;12(2): 143-50).Trachea is deviated right of the midline by the goiter. Insert thyroid no enlarged lymph nodes. Lungs/Pleura: Mild paraseptal emphysema. No pleural effusion, airspace consolidation or pneumothorax. Stable 5 mm perifissural nodule adjacent to the minor fissure, image 76/7. Musculoskeletal: Degenerative disc disease noted within the thoracic spine. No acute or suspicious osseous findings. Review of the MIP images confirms the above findings. CTA ABDOMEN AND PELVIS FINDINGS VASCULAR Aorta: Aortic dissection extends below the renal arteries into the right common iliac artery and terminates just beyond the bifurcation. Celiac: The celiac artery arises off the true lumen. No evidence of aneurysm, dissection or stenosis. SMA: The celiac artery arises off the true lumen. Patent without evidence of aneurysm, dissection, vasculitis or significant stenosis. Renals: Both renal arteries arise off the true lumen. Both renal arteries are patent without evidence of aneurysm, dissection, vasculitis, fibromuscular dysplasia or significant stenosis. IMA: The IMA appears to arise off the false lumen, image 165/5 and image 70/4. The vessel appears patent without evidence for dissection, vasculitis or stenosis. Inflow: See above Veins: No obvious venous abnormality within the limitations of this arterial phase study. Review of the MIP images confirms the above findings. NON-VASCULAR Hepatobiliary: Scattered liver cysts are again noted in appears similar to previous exam. Gallbladder appears normal. No bile duct dilatation. Pancreas: Unremarkable. No pancreatic ductal dilatation or  surrounding inflammatory changes. Spleen: Normal in size without focal abnormality. Adrenals/Urinary Tract: Normal adrenal glands. The kidneys appear scratch set normal and symmetric enhancement of both kidneys. Several right kidney cysts are identified. The largest arises off the lateral cortex of the inferior pole measuring 1.8 cm, image 162/5. No signs of nephrolithiasis or hydronephrosis. Bladder is unremarkable. Stomach/Bowel: Stomach is normal. No bowel wall thickening, inflammation, or distension. Distal colonic diverticula noted without signs of acute inflammation Lymphatic: No abdominopelvic adenopathy. Reproductive: Uterus and bilateral adnexa are unremarkable. Other: No ascites or focal fluid collections within the abdomen or pelvis. Musculoskeletal: No acute or significant osseous findings. Review of the MIP images confirms the above findings. IMPRESSION: 1. 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 2. Intramural hematoma is identified within the distal arch and descending thoracic aorta measuring 7 mm in thickness. 3. The inferior mesenteric artery appears to arise off the false lumen. There is contrast opacification of the IMA however. No signs of bowel ischemia at this time. 4. No evidence for solid organ ischemia within the abdomen or pelvis. 5. Aortic Atherosclerosis (ICD10-I70.0) and Emphysema (ICD10-J43.9). Critical Value/emergent results were called by telephone at the time of interpretation on 11/06/2021 at 12:44 pm to provider Hailey Page PA-C , who verbally acknowledged these results.  Electronically Signed   By: Kerby Moors M.D.   On: 11/06/2021 12:46   VAS [place] UPPER EXTREMITY VENOUS DUPLEX  Result Date: 11/17/2021 UPPER VENOUS STUDY  Patient Name:  Darlene Griffith [facility]  Date of Exam:   11/16/2021 Medical Rec #: [id number]         Accession #:    [id number] Date of Birth: 05/27/1951         Patient Gender: F Patient Age:   82 years Exam Location:  [facility] Procedure:      VAS [place] UPPER EXTREMITY VENOUS DUPLEX Referring Phys: Servando Snare --------------------------------------------------------------------------------  Indications: Swelling Comparison Study: no prior Performing Technologist: Archie Patten RVS  Examination Guidelines: A complete evaluation includes B-mode imaging, spectral Doppler, color Doppler, and power Doppler as needed of all accessible portions of each vessel. Bilateral testing is considered an integral part of a complete examination. Limited examinations for reoccurring indications may be performed as noted.  Right Findings: +----------+------------+---------+-----------+----------+-------+  RIGHT      Compressible Phasicity Spontaneous Properties Summary  +----------+------------+---------+-----------+----------+-------+  IJV            Full        Yes        Yes                         +----------+------------+---------+-----------+----------+-------+  Subclavian     Full        Yes        Yes                         +----------+------------+---------+-----------+----------+-------+  Axillary       Full        Yes        Yes                         +----------+------------+---------+-----------+----------+-------+  Brachial       Full        Yes        Yes                         +----------+------------+---------+-----------+----------+-------+  Radial         Full                                               +----------+------------+---------+-----------+----------+-------+  Ulnar          Full                                               +----------+------------+---------+-----------+----------+-------+  Cephalic       Full                                               +----------+------------+---------+-----------+----------+-------+  Basilic        Full                                                +----------+------------+---------+-----------+----------+-------+  Left Findings: +----------+------------+---------+-----------+----------+-------+  LEFT       Compressible Phasicity Spontaneous Properties Summary  +----------+------------+---------+-----------+----------+-------+  Subclavian                 Yes        Yes                         +----------+------------+---------+-----------+----------+-------+  Summary:  Right: No evidence of deep vein thrombosis in the upper extremity. No evidence of superficial vein thrombosis in the upper extremity.  Left: No evidence of thrombosis in the subclavian.  *See table(s) above for measurements and observations.  Diagnosing physician: Servando Snare MD Electronically signed by Servando Snare MD on 11/17/2021 at 9:28:55 AM.    Final    [place] EKG SITE RITE  Result Date: 11/13/2021 If Site Rite image not attached, placement could not be confirmed due to current cardiac rhythm.  HYBRID OR IMAGING ([facility] ONLY)  Result Date: 11/11/2021 There is no interpretation for this exam.  This order is for images obtained during a surgical procedure.  Please See "Surgeries" Tab for more information regarding the procedure.   DG OR LOCAL ABDOMEN  Result Date: 11/11/2021 CLINICAL DATA:  Unintended retained foreign body. EXAM: OR LOCAL ABDOMEN COMPARISON:  None. FINDINGS: Surgical staples are seen in the pelvis. Small focal density seen in right upper quadrant which may represent calcification. Possible surgical drain seen in left lower quadrant. No other definite radiopaque foreign body seen. IMPRESSION: Small focal density seen in right upper quadrant which may represent calcification. Surgical staples are seen in the pelvis. Probable surgical drain seen in left lower quadrant. No other radiopaque foreign body seen. These results were called by telephone at the time of interpretation on 11/11/2021 at 1:11 pm to provider Gladstone Lighter in [place], who verbally acknowledged these  results. Electronically Signed   By: Marijo Conception M.D.   On: 11/11/2021 13:12    Labs:  Basic Metabolic Panel: Recent Labs  Lab 11/28/21 0511  NA 138  K 4.1  CL 103  CO2 24  GLUCOSE 111*  BUN 14  CREATININE 0.48  CALCIUM 9.6    CBC: Recent Labs  Lab 11/28/21 0511  WBC 10.3  HGB 10.8*  HCT 33.2*  MCV 96.8  PLT 188    CBG: No results for input(s): GLUCAP in the last 168 hours.   Brief HPI:   Darlene Griffith is a 71 y.o. female who presented to [facility] ED on 11/06/2021 with acute onset of upper back pain radiating to her chest. Imaging revealed Type B thoracic aneurysm and vascular surgery, Dr. Stanford Breed consulted. Admitted to CVICU and IV meds started for BP control. Pain persisted and follow-up CTA of the chest revealed stable findings. She underwent TAVR on 1/23. During the procedure, a rapid drop in BP was noted and angiogram confirmed complete disruption of the left EIA. She had further decline in VS with initiation of chest compressions. ROSC was achieved within 3 minutes. Rapid exposure of the infrarenal aorta was carried out and the procedure completed with left CIA to left CFA bypass. She required Cardene infusion for hypertension. This was weaned and was discontinued on oral medication. stable post-op and was extubated on 1/24. CXR was positive for possible pneumonia and she was treated with cefepime. NGT was removed and she her diet was advanced. She had no signs of malperfusion.   Hospital Course: Darlene Griffith was admitted to rehab 11/21/2021 for  inpatient therapies to consist of PT, ST and OT at least three hours five days a week. Past admission physiatrist, therapy team and rehab RN have worked together to provide customized collaborative inpatient rehab. 2/3 reports ongoing chest/back pain. Remains hemodynamically stable. KUB ordered to assess ongoing complaints of significant constipation. Issue with incisional pain on 2/4 and Vitamin C supplement started.  Enema ordered  for constipation. Leukocytosis noted and trending downward. On 2/6 she complained of back pain, heavy feeling of both breasts and right flank apin. Vital signs remained stable. ECG performed with no acute changes. Lidocaine patch ordered to assist in current pain control measures.  Over the next several days the patient continued to complain of persistent chest and back pain.  Her vital signs remained stable.  Dr. Stanford Breed really evaluated the patient and spoke with the patient's family.  Confirmed etiology of pain likely musculoskeletal in origin as all previous work-up have been negative for other causes.  Peripheral pulses intact and no signs of malperfusion.  CTA of the chest abdomen pelvis performed on 2/9.  Graft patent without mention of endoleak.  Patient unable to participate in scheduled therapies due to pain.  Concern for mild cognitive impairment during hospitalization.  Family requests weaning off all pain medications to see if this would improve cognition. Pain syndrome seems to respond only to nitroglycerin SL. Transitioned to Nitro-dur patch on 2/10. Cymbalta added 2/11 to assist with pain control, sleep. Did not get relief with Nitro-dur patch so this was discontinued. Using oxycodone intermittently. Combination of ntg SL and oxycodone seem to bring best relief, but still with episodes of significant pain.   Blood pressures were monitored on TID basis and remained controlled on Norvasc, Coreg and Cozaar.  Rehab course: During patient's stay in rehab weekly team conferences were held to monitor patient's progress, set goals and discuss barriers to discharge. At admission, patient required increased time with all functional mobility and verbose, requiring cues for attention and focus. Max A overall for toileting, CGA with RW.  She  has had improvement in activity tolerance, balance, postural control as well as ability to compensate for deficits. She has had improvement in functional use RUE/LUE   and RLE/LLE as well as improvement in awareness.  Disposition: Home   Diet: Heart  healthy  Special Instructions:  No driving, alcohol consumption or tobacco use.   Keep right arm, abdominal and left groin incisions clean and dry.  Keep dry gauze in left groin crease to wick moisture.  30-35 minutes were spent on discharge planning and discharge summary.  Discharge Instructions     Ambulatory referral to Physical Medicine Rehab   Complete by: As directed    Follow-up hospitalization s/p TVAR, pain syndrome   Call MD for:  redness, tenderness, or signs of infection (pain, swelling, bleeding, redness, odor or green/yellow discharge around incision site)   Complete by: As directed    Call MD for:  severe or increased pain, loss or decreased feeling  in affected limb(s)   Complete by: As directed    Call MD for:  temperature >100.5   Complete by: As directed    Resume previous diet   Complete by: As directed       Allergies as of 12/03/2021       Reactions   Shellfish Allergy Anaphylaxis        Medication List     STOP taking these medications    aspirin-acetaminophen-caffeine 250-250-65 MG tablet Commonly known as: EXCEDRIN MIGRAINE  GOODYS BACK & BODY PAIN PO       TAKE these medications    acetaminophen 325 MG tablet Commonly known as: TYLENOL Take 1-2 tablets (325-650 mg total) by mouth every 4 (four) hours as needed for mild pain.   amLODipine 10 MG tablet Commonly known as: NORVASC Take 1 tablet (10 mg total) by mouth daily. What changed:  medication strength how much to take when to take this   ascorbic acid 1000 MG tablet Commonly known as: VITAMIN C Take 1 tablet (1,000 mg total) by mouth daily.   atorvastatin 40 MG tablet Commonly known as: LIPITOR Take 1 tablet (40 mg total) by mouth daily.   carvedilol 25 MG tablet Commonly known as: COREG Take 1 tablet (25 mg total) by mouth 2 (two) times daily with a meal.    clotrimazole-betamethasone cream Commonly known as: LOTRISONE Apply to affected area 2 times daily prn   diclofenac Sodium 1 % Gel Commonly known as: VOLTAREN Apply 4 g topically 4 (four) times daily.   DULoxetine 30 MG capsule Commonly known as: CYMBALTA Take 1 capsule (30 mg total) by mouth at bedtime.   losartan 25 MG tablet Commonly known as: Cozaar Take 1 tablet (25 mg total) by mouth daily.   Myrbetriq 25 MG Tb24 tablet Generic drug: mirabegron ER Take 1 tablet by mouth once daily   nitroGLYCERIN 0.4 MG SL tablet Commonly known as: NITROSTAT Place 1 tablet (0.4 mg total) under the tongue every 5 (five) minutes as needed for chest pain. May take up to 3 tabs in one hour and no more than 10 tablets in 24 hours.   oxyCODONE 5 MG immediate release tablet Commonly known as: Oxy IR/ROXICODONE Take 1 tablet (5 mg total) by mouth every 4 (four) hours as needed for severe pain.   pantoprazole 40 MG tablet Commonly known as: PROTONIX Take 1 tablet (40 mg total) by mouth at bedtime.   polyethylene glycol 17 g packet Commonly known as: MIRALAX / GLYCOLAX Take 17 g by mouth daily as needed for mild constipation.   prochlorperazine 5 MG tablet Commonly known as: COMPAZINE Take 1-2 tablets (5-10 mg total) by mouth every 6 (six) hours as needed for nausea.   senna 8.6 MG Tabs tablet Commonly known as: SENOKOT Take 2 tablets (17.2 mg total) by mouth at bedtime.   Spiriva Respimat 2.5 MCG/ACT Aers Generic drug: Tiotropium Bromide Monohydrate Inhale 2 puffs into the lungs daily.   traZODone 50 MG tablet Commonly known as: DESYREL Take 0.5-1 tablets (25-50 mg total) by mouth at bedtime as needed for sleep.        Follow-up Information     Cherre Robins, MD Follow up.   Specialties: Vascular Surgery, Interventional Cardiology Why: Office will call you to arrange your appt (sent) Contact information: [place] [id number] [phone]          [place], Betty G, MD Follow up.   Specialty: Family Medicine Why: Call On Wednesday to arrange hospital follow-up appointment Contact information: [place] [address] [phone]         Izora Ribas, MD Follow up.   Specialty: Physical Medicine and Rehabilitation Why: office will call you to arrange your appt (sent) Follow-up hospitalization s/p TVAR Contact information: [address] [place] [address] [phone]         Wellington Hampshire, MD Follow up.   Specialty: Cardiology Why: call for appointment for follow up  Contact information: [place] [address] [phone]                 Signed: Barbie Banner 12/03/2021, 2:09 PM

## 2021-11-23 DIAGNOSIS — I7100 Dissection of unspecified site of aorta: Secondary | ICD-10-CM | POA: Diagnosis not present

## 2021-11-23 MED ORDER — GABAPENTIN 100 MG PO CAPS: 100.0000 mg | ORAL_CAPSULE | Freq: Three times a day (TID) | ORAL | Status: DC

## 2021-11-23 MED ORDER — ASCORBIC ACID 500 MG PO TABS
1000.0000 mg | ORAL_TABLET | Freq: Every day | ORAL | Status: DC
  Administered 2021-11-23 – 2021-12-03 (×11): 1000 mg via ORAL
  Filled 2021-11-23 (×11): qty 2

## 2021-11-23 MED ORDER — CHLORHEXIDINE GLUCONATE CLOTH 2 % EX PADS
6.0000 | MEDICATED_PAD | Freq: Two times a day (BID) | CUTANEOUS | Status: DC
  Administered 2021-11-23 – 2021-11-25 (×4): 6 via TOPICAL

## 2021-11-23 NOTE — Plan of Care (Signed)
°  Problem: Consults Goal: RH GENERAL PATIENT EDUCATION Description: See Patient Education module for education specifics. Outcome: Progressing   Problem: RH BOWEL ELIMINATION Goal: RH STG MANAGE BOWEL WITH ASSISTANCE Description: STG Manage Bowel with Assistance. Mod I Outcome: Progressing Goal: RH STG MANAGE BOWEL W/MEDICATION W/ASSISTANCE Description: STG Manage Bowel with Medication with mod I  Assistance. Outcome: Progressing   Problem: RH SKIN INTEGRITY Goal: RH STG ABLE TO PERFORM INCISION/WOUND CARE W/ASSISTANCE Description: STG Able To Perform Incision/Wound Care With Assistance. Mod I Outcome: Progressing   Problem: RH PAIN MANAGEMENT Goal: RH STG PAIN MANAGED AT OR BELOW PT'S PAIN GOAL Description: Less than 5 Outcome: Not Progressing Note: Patient continues to have pain from 8-10/10 throughout the day even with pain receiving pain meds.   Problem: RH KNOWLEDGE DEFICIT GENERAL Goal: RH STG INCREASE KNOWLEDGE OF SELF CARE AFTER HOSPITALIZATION Description: Patient/family will be able to verbalize management of condition including diet, exercise, medications, skin integrity, prevention, and pain management with cues/handouts Outcome: Progressing

## 2021-11-23 NOTE — Progress Notes (Signed)
Physical Therapy Session Note  Patient Details  Name: Darlene Griffith MRN: [id number] Date of Birth: May 18, 1951  Today's Date: 11/23/2021 PT Individual Time: 1000-1115 PT Individual Time Calculation (min): 75 min   Short Term Goals: Week 1:  PT Short Term Goal 1 (Week 1): STG=LTG due to ELOS  Skilled Therapeutic Interventions/Progress Updates:  Chart reviewed and pt agreeable to therapy. Pt received semi-reclined in bed with 8/10 c/o pain at surgical site.  Session focused on rehabilitation participation and functional transfers to increase independence for home access. Pt initially declined therapy siting fatigue and need to rest. This PT explained need for therapy to increase activity tolerance and reduce overall fatigue. Pt made enquiries about physical activity and memory, with this PT providing education on sitting out of bed and light to increase alertness. Pt very receptive and supportive of reasons to participate. Pt then practiced functional transfers to promote home mobility. Pt initiated session with transfer to EOB using SBA. At EOB pl completed trunk control activities to reduce pain with mobilization. Pt then completed seated LAQ x10 for BLE. Pt reported need for toileting and was transferred to [facility] with CGA + RW. Once finished, pt required supervision for hygiene and MaxA for donning new brief. Pt returned to bed with CGA + RW. Session education also emphasized safe mobility in setting of surgery, which pt was able to perform t/o mobilization. At end of session, pt was left semi-reclined in bed with alarm engaged, nurse call bell and all needs in reach.   Therapy Documentation Precautions:  Precautions Precautions: Fall, Other (comment) Precaution Comments: Keep SBP <160 Restrictions Weight Bearing Restrictions: No    Therapy/Group: Individual Therapy  Marquette Saa, PT, DPT 11/23/2021, 12:30 PM

## 2021-11-23 NOTE — Progress Notes (Signed)
Physical Therapy Session Note  Patient Details  Name: Darlene Griffith MRN: [id number] Date of Birth: 1950/12/25  Today's Date: 11/23/2021 PT Individual Time: 7893-8101 PT Individual Time Calculation (min): 25 min   Short Term Goals: Week 1:  PT Short Term Goal 1 (Week 1): STG=LTG due to ELOS  Skilled Therapeutic Interventions/Progress Updates:  Pt received supine in bed, family present. Pt reported 9/10 "all over my body" and was premedicated. Offered repositioning and distraction for pain modulation, but pt adamant against participating due to "having a bad day and hurting". Family continuously encouraging pt to participate, which agitated pt and resulted in pt impulsively jumping out of bed on opposite side of therapist, setting off bed alarm and walking out of room without pants, RW or gait belt, family holding onto patient until therapist could intervene. Therapist obtained RW for safety but no WC in room, requested NT follow pt w/recliner down hallway. Pt very angry about gait belt due to it "hurting her girls". Pt ambulated >150' w/RW and min A for steadying assist and to prevent pt sitting down on ground. Noted decreased step clearance bilaterally, decreased step length and inconsistent cadence, as pt occasionally running and then slowing down due to pain. Pt returned to room and sat on bed w/min A. As therapist was bringing recliner back into room, pt's family had transferred pt to [facility] unsafely. Informed pt's family that CIR must be present when moving pts due to safety risks, family did not respond. Pt voided continently and performed peri care w/set-up assist. Sit <>stand from [facility] w/CGA and pt ambulated 5' to bed w/RW and Min A. Sit <>supine w/CGA and pt was left supine in bed, bed alarm on, family present and all needs in reach.   Therapy Documentation Precautions:  Precautions Precautions: Fall, Other (comment) Precaution Comments: Keep SBP <160 Restrictions Weight Bearing Restrictions:  No   Therapy/Group: Individual Therapy Darlene Griffith, PT, DPT  11/23/2021, 7:53 AM

## 2021-11-23 NOTE — Progress Notes (Signed)
Nurse responded to chair alarm in patient room. When nurse arrived to room patient was transferred to bed by daughter. Daughter informed that only staff is to transfer patient until they are checked off.  Sanda Linger, LPN

## 2021-11-23 NOTE — Progress Notes (Signed)
Occupational Therapy Session Note  Patient Details  Name: Darlene Griffith MRN: [id number] Date of Birth: 1951/02/04  Today's Date: 11/23/2021 OT Individual Time: 4709-6283 OT Individual Time Calculation (min): 47 min    Short Term Goals: Week 1:  OT Short Term Goal 1 (Week 1): STG = LTG due to ELOS  Skilled Therapeutic Interventions/Progress Updates:    Pt received semi-reclined in bed with nursing and family present. Requesting therapist return later due to not feeling well, agreeable to do therapy at later time. Returned 45 min later and pt, agreeable to therapy with encouragement/family support. . Session focus on activity tolerance, transfer retraining, BUE strengthening in prep for improved ADL/IADL/func mobility performance + decreased caregiver burden. Required extensive encouragement and time for initiation of tasks throughout session as pt often tangential and internally distracted.   Came to sitting EOB with min A to lift trunk and mod cues for BUE/BLE placement.  Completed 5 sit to stands in 55 seconds due to requiring extensive time to initiate first stand and forgetting how many reps she had to complete (>15 seconds associated with increased falls risk, but suspect pt will be able to complete under 30 seconds when maintaining sustained attention. Completed stands with S and use of BUE on RW.   In standing, practiced tossing/catching ball with BUE and then 1 lb dowel rod from various vantage points, CGA for balance throughout. Reporting increase in back pain/fatigue after ~5 min standing.  Stand-pivot  > recliner with RW and CGA. LPN present for medication administration. Wrote in [employer] tasks completed, pt surprised that had SLP session earlier that day because she had forgotten.    Pt left seated in recliner with bed alarm engaged, call bell in reach, and all immediate needs met.    Therapy Documentation Precautions:  Precautions Precautions: Fall, Other  (comment) Precaution Comments: Keep SBP <160 Restrictions Weight Bearing Restrictions: No  Pain: see session note ADL: See Care Tool for more details.  Therapy/Group: Individual Therapy  Volanda Napoleon MS, OTR/L  11/23/2021, 6:58 AM

## 2021-11-23 NOTE — Progress Notes (Signed)
PROGRESS NOTE   Subjective/Complaints: Abdominal pain improved but having flank pain at staples site. Received muscle relaxer and oxycodone today but did not find these helpful enough. Vitamin C supplement started to improve sensitivity to pain. She is hopeful that with better pain control she can participate more  ROS: +abdominal pain, +flank pain  Objective:   No results found. Recent Labs    11/22/21 0639  WBC 10.7*  HGB 9.2*  HCT 29.0*  PLT 111*   Recent Labs    11/22/21 0639  NA 137  K 3.8  CL 101  CO2 27  GLUCOSE 90  BUN 13  CREATININE 0.47  CALCIUM 8.6*    Intake/Output Summary (Last 24 hours) at 11/23/2021 1645 Last data filed at 11/23/2021 0834 Gross per 24 hour  Intake 360 ml  Output 250 ml  Net 110 ml        Physical Exam: Vital Signs Blood pressure 115/61, pulse 70, temperature 98 F (36.7 C), resp. rate 18, height 5' 0.98" (1.549 m), weight 68.6 kg, SpO2 99 %. Gen: no distress, normal appearing HEENT: oral mucosa pink and moist, NCAT Cardio: Reg rate Chest: normal effort, normal rate of breathing Abd: soft, non-distended Ext: no edema Psych: pleasant, normal affect Musculoskeletal:        General: No swelling or deformity.     Cervical back: Neck supple. No tenderness.     Comments: 4+/5 in Biceps, triceps, WE, grip and FA B/L  4+/5 in HF/KE/DF and PF B/L- Poor effort vs weakness- hard to determine  Skin:    Comments: L abdominal incision from L of umbilicus to LLQ- staples intact- no drainage; a little puffy L groin /inguinal incision- from Mid inguinal to pubis- slightly moist- staples intact- no drainage seen- no erythema on either.    Neurological:     Mental Status: She is alert.     Comments: Pleasantly confused; vague, tangential- Ox1-2  Psychiatric:        Mood and Affect: Mood normal.        Behavior: Behavior normal.    Assessment/Plan: 1. Functional deficits which  require 3+ hours per day of interdisciplinary therapy in a comprehensive inpatient rehab setting. Physiatrist is providing close team supervision and 24 hour management of active medical problems listed below. Physiatrist and rehab team continue to assess barriers to discharge/monitor patient progress toward functional and medical goals  Care Tool:  Bathing    Body parts bathed by patient: Right arm, Left arm, Chest, Abdomen, Right upper leg, Left upper leg, Face   Body parts bathed by helper: Right lower leg, Left lower leg, Front perineal area, Buttocks     Bathing assist Assist Level: Moderate Assistance - Patient 50 - 74%     Upper Body Dressing/Undressing Upper body dressing   What is the patient wearing?: Pull over shirt    Upper body assist Assist Level: Supervision/Verbal cueing    Lower Body Dressing/Undressing Lower body dressing      What is the patient wearing?: Incontinence brief, Pants     Lower body assist Assist for lower body dressing: Maximal Assistance - Patient 25 - 49%     Toileting Toileting  Toileting assist Assist for toileting: Total Assistance - Patient < 25%     Transfers Chair/bed transfer  Transfers assist  Chair/bed transfer activity did not occur: Refused        Locomotion Ambulation   Ambulation assist   Ambulation activity did not occur: Refused  Assist level: Minimal Assistance - Patient > 75% Assistive device: Walker-rolling Max distance: 150   Walk 10 feet activity   Assist  Walk 10 feet activity did not occur: Refused  Assist level: Minimal Assistance - Patient > 75% Assistive device: Walker-rolling   Walk 50 feet activity   Assist Walk 50 feet with 2 turns activity did not occur: Refused  Assist level: Minimal Assistance - Patient > 75% Assistive device: Walker-rolling    Walk 150 feet activity   Assist Walk 150 feet activity did not occur: Refused  Assist level: Minimal Assistance - Patient >  75% Assistive device: Walker-rolling    Walk 10 feet on uneven surface  activity   Assist Walk 10 feet on uneven surfaces activity did not occur: Refused         Wheelchair     Assist Is the patient using a wheelchair?: Yes Type of Wheelchair: Manual Wheelchair activity did not occur: Refused         Wheelchair 50 feet with 2 turns activity    Assist    Wheelchair 50 feet with 2 turns activity did not occur: Refused       Wheelchair 150 feet activity     Assist  Wheelchair 150 feet activity did not occur: Refused       Blood pressure 115/61, pulse 70, temperature 98 F (36.7 C), resp. rate 18, height 5' 0.98" (1.549 m), weight 68.6 kg, SpO2 99 %.  Medical Problem List and Plan: 1. Functional deficits/debility secondary to aortic dissection -pt was very clear she panics when told "serious things"-              -patient may  shower- if covers incisions             -ELOS/Goals: 12-14 days- supervision  HFU scheduled.   Continue CIR 2.  Antithrombotics: -DVT/anticoagulation:  Pharmaceutical: Heparin [place] q 8 hours             -antiplatelet therapy: n/a 3. Pain Management: Tylenol, Neurontin, Robaxin, oxycodone             -family said don't want her to have robaxin/gabapentin- will change to skelaxin and d/c gabapentin 4. Mood: LCSW to evaluate and provide emotional support             -antipsychotic agents: n/a 5. Neuropsych: This patient is capable of making decisions on her own behalf. 6. Skin/Wound Care: Routine skin care checks             -- Monitor LLQ, left groin incision 7. Fluids/Electrolytes/Nutrition: Routine ins and outs and follow-up chemistries             -- Heart healthy diet, thin liquids             --Glucose has been in normal range; DC SSI and follow-up BMET 8. Blood loss anemia: Monitor CBC 9. Hypertension: continue Coreg, Norvasc, Cozaar 10. Hyperlipidemia: continue Lipitor 11: Tobacco use: Provide cessation counseling              --Nicotine patch ?             --Continue pulmonary toilet 12: COPD: Continue Yupelri nebulizer daily 13: GERD:  continue Protonix 14: Hyperthyroidismmulti-nodular goiter:  --Dr. Loanne Drilling, endocrinologist>>not on methimazole currently --TSH = 0.022 1/20, normal T3, T4 this admission 15: History of pulmonary nodules: stable in size of 5 mm nodule on 1/18 as compared to 2016, outpatient follow-up.  16. Constipation: enema ordered.  17. Feels cold- will order kpad for feeling cold/pain 18. Abdominal pain: resolved with treatment of constipation 19. Flank pain: placed nursing order to ice 15 minutes three times per day 20. Leukocytosis: trending downward, repeat Thursday     LOS: 2 days A FACE TO FACE EVALUATION WAS PERFORMED  Clide Deutscher Joene Gelder 11/23/2021, 4:45 PM

## 2021-11-23 NOTE — Progress Notes (Signed)
Speech Language Pathology Daily Session Note  Patient Details  Name: Darlene Griffith MRN: [id number] Date of Birth: September 11, 1951  Today's Date: 11/23/2021 SLP Individual Time: 9563-8756 SLP Individual Time Calculation (min): 45 min  Short Term Goals: Week 1: SLP Short Term Goal 1 (Week 1): STG=LTG due to ELOS  Skilled Therapeutic Interventions: Pt seen for skilled ST with focus on cognitive goals, pt in bed on phone when SLP entered but ending call to participate in tasks. Pt continues to endorse 10/10 pain in L side. When asked about events leading up to and during hospitalization, pt unable to recall any details. Pt calling daughter who provided information re: pain in chest at home, daughter calling EMS and pt transported to hospital. Pt states she has no memory of this or any time in ICU, is hesitant to discuss events because they "must be upsetting". At this point, pt becoming alarmed and reporting acute visual changes saying "everything just went blurry" and "I can't focus on anything". SLP bringing RN into room for assessment and RN contacted MD. After ~2 minutes, pt reports symptoms lessening and able to see/focus. Pt very verbose at this point requiring verbal cues for redirection to task. Pt largest complaint this session was inability to recall simple events throughout and between days. SLP initiating memory notebook for pt, staff and family to write important information in to increase recall and carryover. Pt very appreciative. Left in bed with RN and nursing student for med pass. Cont ST POC.   Pain Pain Assessment Pain Scale: 0-10 Pain Score: 10-Worst pain ever Pain Type: Acute pain Pain Location: Chest Pain Orientation: Mid Pain Descriptors / Indicators: Aching Pain Frequency: Constant Pain Onset: On-going Patients Stated Pain Goal: 0 Pain Intervention(s): Medication (See eMAR)  Therapy/Group: Individual Therapy  Dewaine Conger 11/23/2021, 9:59 AM

## 2021-11-24 NOTE — IPOC Note (Signed)
Overall Plan of Care [facility]) Patient Details Name: Darlene Griffith MRN: [id number] DOB: 1951-04-19  Admitting Diagnosis: Aortic dissection [facility])  Hospital Problems: Principal Problem:   Aortic dissection (HCC)     Functional Problem List: Nursing Bowel, Endurance, Medication Management, Motor, Pain, Skin Integrity  PT Balance, Behavior, Edema, Endurance, Motor, Pain, Perception, Safety, Sensory, Skin Integrity  OT Balance, Safety, Cognition, Edema, Endurance, Motor, Pain  SLP Cognition, Pain  TR         Basic ADLs: OT Grooming, Bathing, Dressing, Toileting     Advanced  ADLs: OT Simple Meal Preparation     Transfers: PT Bed Mobility, Bed to Chair, Car, Manufacturing systems engineer, Metallurgist: PT Ambulation, Emergency planning/management officer, Stairs     Additional Impairments: OT    SLP Social Cognition   Memory, Attention, Awareness  TR      Anticipated Outcomes Item Anticipated Outcome  Self Feeding mod I  Swallowing      Basic self-care  Supervision  Toileting  Supervision   Bathroom Transfers Supervision  Bowel/Bladder  remain continent and maintain regular pattern of emtpying bowel and bladder  Transfers  Supervision assist with LRAD  Locomotion     Communication     Cognition  mod I-to-sup A  Pain  less than 5  Safety/Judgment  no falls, skin breakdown, infection whileon rehab   Therapy Plan: PT Intensity: Minimum of 1-2 x/day ,45 to 90 minutes PT Frequency: 5 out of 7 days PT Duration Estimated Length of Stay: 7-10 days OT Intensity: Minimum of 1-2 x/day, 45 to 90 minutes OT Frequency: 5 out of 7 days OT Duration/Estimated Length of Stay: 7-10 days SLP Intensity: Minumum of 1-2 x/day, 30 to 90 minutes SLP Frequency: 3 to 5 out of 7 days SLP Duration/Estimated Length of Stay: 7-10 days   Due to the current state of emergency, patients may not be receiving their 3-hours of Medicare-mandated therapy.   Team Interventions: Nursing  Interventions Patient/Family Education, Bowel Management, Disease Management/Prevention, Pain Management, Discharge Planning  PT interventions Ambulation/gait training, Community reintegration, DME/adaptive equipment instruction, Neuromuscular re-education, Psychosocial support, Wheelchair propulsion/positioning, Stair training, UE/LE Strength taining/ROM, Training and development officer, Discharge planning, Pain management, Skin care/wound management, Therapeutic Activities, UE/LE Coordination activities, Cognitive remediation/compensation, Disease management/prevention, Functional mobility training, Patient/family education, Splinting/orthotics, Therapeutic Exercise, Visual/perceptual remediation/compensation  OT Interventions Balance/vestibular training, Discharge planning, Pain management, Self Care/advanced ADL retraining, Therapeutic Activities, Disease mangement/prevention, Patient/family education, Functional mobility training, Therapeutic Exercise, Cognitive remediation/compensation, DME/adaptive equipment instruction, Psychosocial support, UE/LE Strength taining/ROM, Wheelchair propulsion/positioning  SLP Interventions Cueing hierarchy, Environmental controls, Functional tasks, Internal/external aids, Patient/family education, Therapeutic Activities  TR Interventions    SW/CM Interventions Discharge Planning, Psychosocial Support, Patient/Family Education, Disease Management/Prevention   Barriers to Discharge MD  Medical stability  Nursing Decreased caregiver support 1 level, 2ste no rails solo PTA; daughter Darlene Griffith to assist  PT Inaccessible home environment, Decreased caregiver support, Medication compliance, Behavior    OT Home environment access/layout, Behavior    SLP      SW Insurance for SNF coverage     Team Discharge Planning: Destination: PT-Home ,OT- Home , SLP-Home Projected Follow-up: PT-Home health PT, OT-  Home health OT, 24 hour supervision/assistance, SLP-Other (comment)  (TBD) Projected Equipment Needs: PT-To be determined, OT- To be determined, SLP-None recommended by SLP Equipment Details: PT- , OT-  Patient/family involved in discharge planning: PT- Patient,  OT-Patient, SLP-Patient  MD ELOS: 12-14 days Medical Rehab Prognosis:  Excellent Assessment: Darlene Griffith is a  71 year old woman admitted to CIR with functional deficits/debility secondary to aortic dissection. Course currently complicated by pain at surgical incision site, but oversedation with pain medications. Medications are being managed, and labs and vitals are being monitored regularly.      See Team Conference Notes for weekly updates to the plan of care

## 2021-11-24 NOTE — Progress Notes (Signed)
Occupational Therapy Session Note  Patient Details  Name: Darlene Griffith MRN: [id number] Date of Birth: 23-Feb-1951  Today's Date: 11/24/2021 OT Individual Time: 5462-7035 OT Individual Time Calculation (min): 53 min    Short Term Goals: Week 1:  OT Short Term Goal 1 (Week 1): STG = LTG due to ELOS  Skilled Therapeutic Interventions/Progress Updates:  Pt greeted supine in bed reporting 10/10 pain in chest. Pt declined OOB mobility or repositioning in bed. Pt was agreeable to complete UB grooming tasks from bed level with set- up assist. Focused session on pain mgmt strategies such as imagery( distraction), deep breathing, using pillow at chest for comfort, music therapy, and body scanning with education provided on all strategies. Pt completed 3 mins of deep belly breathing from sidelying. Wrote down all strategies for pt in memory book to increase carryover. Pt did report need to void bladder, pt declined OOB transfer to [facility] despite max encouragement, MIN A to roll for bed pan placement ( MAX A overall for toileting), pt able to cleanse self from supine with set- up of wash cloth.  pt left in sidelying with all needs within reach and bed alarm activated.                        Therapy Documentation Precautions:  Precautions Precautions: Fall, Other (comment) Precaution Comments: Keep SBP <160 Restrictions Weight Bearing Restrictions: No      Therapy/Group: Individual Therapy  Corinne Ports [facility] 11/24/2021, 9:59 AM

## 2021-11-24 NOTE — Progress Notes (Signed)
Speech Language Pathology Daily Session Note  Patient Details  Name: Darlene Griffith MRN: [id number] Date of Birth: 02-Jun-1951  Today's Date: 11/24/2021 SLP Individual Time: 5625-6389 SLP Individual Time Calculation (min): 25 min  Short Term Goals: Week 1: SLP Short Term Goal 1 (Week 1): STG=LTG due to ELOS  Skilled Therapeutic Interventions:Skilled ST services focused on cognitive skills. Pt was on bedpan upon entering room and requested SLP remove pan after only urinating. SLP removed bedpan and pt followed basic commands to apply brief. Pt was unable to recall memory notebook, but was able to read notes from today with mod A verbal cues due to limited attention impacted by pain. Pt reported 101/10 chest pain and feeling of "burning up." Slp repositioned pt in bed and pt requested to rest. Pt missed 20 minutes of skilled ST services due to pain. SLP notified nurse of pain and fever sensation. Pt was left in room with call bell within reach and bed alarm set. SLP recommends to continue skilled services.      Pain Pain Assessment Pain Scale: 0-10 Pain Score: 10-Worst pain ever Faces Pain Scale: Hurts worst Pain Type: Acute pain Pain Location: Chest Pain Onset: On-going Pain Intervention(s): Emotional support;Rest;Repositioned;RN made aware  Therapy/Group: Individual Therapy  Harlan Ervine  [facility] 11/24/2021, 2:58 PM

## 2021-11-24 NOTE — Progress Notes (Signed)
VAST RN to bedside providing line care for PICC. Pt's daughter came in and asked about her mother receiving [place] for help with bowel movements. She stated she has been mentioning this to staff for a week now and no one is listening. After reviewing the chart, I told the daughter and patient that an enema had been ordered, but I did not see where it had been administered. I also told them I did not see an order for [place], but advised that I was the IV nurse and didn't deal with those kinds of things. I suggested they mention the request again to the nurses during shift report. They both thanked me for the guidance.

## 2021-11-24 NOTE — Plan of Care (Signed)
°  Problem: Consults Goal: RH GENERAL PATIENT EDUCATION Description: See Patient Education module for education specifics. Outcome: Progressing   Problem: RH BOWEL ELIMINATION Goal: RH STG MANAGE BOWEL WITH ASSISTANCE Description: STG Manage Bowel with Assistance. Mod I Outcome: Progressing Goal: RH STG MANAGE BOWEL W/MEDICATION W/ASSISTANCE Description: STG Manage Bowel with Medication with mod I  Assistance. Outcome: Progressing   Problem: RH SKIN INTEGRITY Goal: RH STG ABLE TO PERFORM INCISION/WOUND CARE W/ASSISTANCE Description: STG Able To Perform Incision/Wound Care With Assistance. Mod I Outcome: Progressing   Problem: RH PAIN MANAGEMENT Goal: RH STG PAIN MANAGED AT OR BELOW PT'S PAIN GOAL Description: Less than 5 Outcome: Not Progressing   Problem: RH KNOWLEDGE DEFICIT GENERAL Goal: RH STG INCREASE KNOWLEDGE OF SELF CARE AFTER HOSPITALIZATION Description: Patient/family will be able to verbalize management of condition including diet, exercise, medications, skin integrity, prevention, and pain management with cues/handouts Outcome: Progressing

## 2021-11-24 NOTE — Progress Notes (Signed)
Nurse was informed that patient refuses to use [facility] despite multiple encouragement throughout the day. Patient stated that "if it was a choice between be moving and dying than I would lay here and die".  Patient encouraged to mobilize to recliner and patient refused. Patient c/o 5/10 pain to abdomen and patient received prn pain medication as ordered. Patient is also refusing to participate in personal hygiene. Sanda Linger, LPN

## 2021-11-25 DIAGNOSIS — I7100 Dissection of unspecified site of aorta: Secondary | ICD-10-CM | POA: Diagnosis not present

## 2021-11-25 DIAGNOSIS — Z0279 Encounter for issue of other medical certificate: Secondary | ICD-10-CM

## 2021-11-25 MED ORDER — OXYCODONE HCL 5 MG PO TABS
5.0000 mg | ORAL_TABLET | Freq: Every day | ORAL | Status: DC
  Administered 2021-11-27 – 2021-11-28 (×2): 5 mg via ORAL
  Filled 2021-11-25 (×3): qty 1

## 2021-11-25 MED ORDER — ENOXAPARIN SODIUM 40 MG/0.4ML IJ SOSY
40.0000 mg | PREFILLED_SYRINGE | Freq: Every day | INTRAMUSCULAR | Status: DC
  Administered 2021-11-25 – 2021-12-03 (×9): 40 mg via SUBCUTANEOUS
  Filled 2021-11-25 (×9): qty 0.4

## 2021-11-25 MED ORDER — SENNA 8.6 MG PO TABS
2.0000 | ORAL_TABLET | Freq: Every day | ORAL | Status: DC
  Administered 2021-11-25 – 2021-11-26 (×2): 17.2 mg via ORAL
  Filled 2021-11-25 (×2): qty 2

## 2021-11-25 MED ORDER — LIDOCAINE 5 % EX PTCH
1.0000 | MEDICATED_PATCH | CUTANEOUS | Status: DC
  Administered 2021-11-25 – 2021-12-01 (×4): 1 via TRANSDERMAL
  Filled 2021-11-25 (×4): qty 1

## 2021-11-25 MED ORDER — SORBITOL 70 % SOLN
30.0000 mL | Freq: Once | Status: DC
  Filled 2021-11-25: qty 30

## 2021-11-25 NOTE — Progress Notes (Signed)
Occupational Therapy Session Note  Patient Details  Name: Darlene Griffith MRN: [id number] Date of Birth: [birth year]  Today's Date: 11/25/2021 OT Individual Time: 1350-1443 OT Individual Time Calculation (min): 53 min    Short Term Goals: Week 1:  OT Short Term Goal 1 (Week 1): STG = LTG due to ELOS  Skilled Therapeutic Interventions/Progress Updates:  Skilled OT intervention completed with focus on self-care, functional transfers, pain management. Pt received seated in recliner, with pt's bottom noticeably towards the edge of the leg rests. Pt required min A to reposition in recliner to prevent bottoming out prior to start of session. Pt reporting extreme pain in back, with frequent requests to receive pain meds, however after checking in with RN, nursing reports pt asks shortly after being given timed meds, with pt reporting she doesn't remember nursing administering less than an hour ago. Educated pt on pain management strategies to utilize that don't involve meds, with pt able to teach back 2 methods to help ease pain. Note- pt VERY slow and distracted by pain this session, requiring re-directive cues throughout transitions. Pt tolerated sitting up in recliner, for UB bathing/grooming with supervision, with pt doffing/redonning shirt with supervision as well. Pt completed sit > stand and stand pivot transfer using RW and CGA, with pt able to remain standing for about 1 min as pt reported standing improved the discomfort, however randomly said "ahhh can't take this anymore". Pt completed sitting EOB to supine in bed mobility with min A for BLE management. Mod A required for repositioning pt for comfort in bed, and towards HOB. Provided several pillows, and positioned k-pad under pt's back with setting turned up per pt request. Therapist assisted pt in filling out her memory book, with pt requiring mod help to remember OT session, with pt improving on recalling session focus with hints provided. Pt left  upright in bed, with bed alarm on and all needs in reach at end of session.   Therapy Documentation Precautions:  Precautions Precautions: Fall, Other (comment) Precaution Comments: Keep SBP <160 Restrictions Weight Bearing Restrictions: No  Pain: Unrated pain in upper/lower back, L axillary region and L hip- RN aware, pt pre-medicated, with therapist repositioning pt for comfort   Therapy/Group: Individual Therapy  Darlene Griffith Darlene Griffith 11/25/2021, 7:51 AM

## 2021-11-25 NOTE — Progress Notes (Addendum)
Asked to evaluate the patient secondary to ongoing back pain rated at 10 out of 10 and patient in tears.  Her attendant RN is at bedside and she is out of bed to chair.  She reports the pain is in the upper left back as previously.  When asked about anterior or substernal chest pain or jaw pain she states #No, but both breasts feel extremely heavy like they are full of rocks".  She denies pleuritic chest pain, N or V. Pain was not brought on by exertion or toileting.  She is not diaphoretic, not short of breath and vital signs are stable.  Palpable pedal pulses, no peritoneal signs.  She was able to eat a few bites of breakfast prior to onset of pain.  She asked that I explained in simple terms her presentation and procedure.  I discussed the few minutes of chest compression she had during surgery could have caused soft tissue and ligamental injury and likely cause of bouts spasmodic pain.  Patient repositioned with pillows with improvement.  She has no signs of malperfusion.  Will obtain twelve-lead EKG to rule out acute changes.  10:30 am>>EKG reviewed. Unchanged from 2/2 with no ST changes. PR interval 213 ms. Now states pain is in the right flank. Will try Lidocaine patch prior to increasing dosages of other meds.   Continue to monitor.

## 2021-11-25 NOTE — Progress Notes (Signed)
Physical Therapy Session Note  Patient Details  Name: Darlene Griffith MRN: [id number] Date of Birth: [birth year]  Today's Date: 11/25/2021 PT Individual Time: 0800-0854 PT Individual Time Calculation (min): 54 min   Short Term Goals: Week 1:  PT Short Term Goal 1 (Week 1): STG=LTG due to ELOS  Skilled Therapeutic Interventions/Progress Updates:   Received pt semi-reclined in bed. Pt reported "not doing good" and feeling completely "burnt out and exhausted from the physical and mental aspects of therapy". Pt also reported increased chest pain and tightness since arriving in CIR - RN notified. MD present for morning rounds and RN present to flush IV. Noted breakfast tray untouched and with encouragement, pt agreed to transfer to recliner to eat breakfast. Pt transferred semi-reclined<>sitting EOB with HOB elevated and use of bedrails with min HHA. Pt reported feeling "smelly" and reported having soiled brief. Stood with RW and CGA and removed dirty brief (which actually wasn't soiled) and donned new one in standing with mod A. Transferred to recliner with RW and CGA then reported urge to have BM. Pt declined ambulating to bathroom due to fatigue, therefore transferred recliner<>bedside commode with RW and CGA. Pt required increased time on bedside commode and continent of bladder - performed peri-care in standing with CGA. Transferred bedside commode<>recliner with RW and CGA and spent increased time repositioning in recliner for comfort. Pt c/o increased low back pain - positioned K-pad for relief. Concluded session with pt sitting in recliner eating breakfast, needs within reach, and chair pad alarm on. Memory notebook updated.   Of note, pt required increased time with all functional mobility and verbose, requiring cues for attention and focus.   Therapy Documentation Precautions:  Precautions Precautions: Fall, Other (comment) Precaution Comments: Keep SBP <160 Restrictions Weight Bearing  Restrictions: No  Therapy/Group: Individual Therapy Alfonse Alpers PT, DPT   11/25/2021, 7:11 AM

## 2021-11-25 NOTE — Progress Notes (Signed)
PROGRESS NOTE   Subjective/Complaints: Pt reports having significant chest/musculoskeletal and lower abd pain like has been having.   Also IV nurse mentioned pt needs to have BM- will give scheduled senokot and also give sorbitol if no BM by then.   Also, therapy asking for scheduled pain meds before therapy- really impacting pt's ability to participate.   ROS:  Pt denies SOB, abd pain, CP, N/V/(+) C/D, and vision changes   Objective:   No results found. No results for input(s): WBC, HGB, HCT, PLT in the last 72 hours.  No results for input(s): NA, K, CL, CO2, GLUCOSE, BUN, CREATININE, CALCIUM in the last 72 hours.   Intake/Output Summary (Last 24 hours) at 11/25/2021 0839 Last data filed at 11/24/2021 1400 Gross per 24 hour  Intake 0 ml  Output --  Net 0 ml        Physical Exam: Vital Signs Blood pressure 137/64, pulse 76, temperature 99.2 F (37.3 C), temperature source Oral, resp. rate 18, height 5' 0.98" (1.549 m), weight 68.6 kg, SpO2 96 %.    General: awake, alert, appropriate, sitting EOB; IV nurse and PT in room; NAD HENT: conjugate gaze; oropharynx moist CV: regular rate; no JVD Pulmonary: CTA B/L; no W/R/R- good air movement GI: soft, NT except over lower abd from bruising/incisions; somewhat distended; hypoactive BS Psychiatric: appropriate Neurological: alert- poor memory  Musculoskeletal:        General: No swelling or deformity.     Cervical back: Neck supple. No tenderness.     Comments: 4+/5 in Biceps, triceps, WE, grip and FA B/L  4+/5 in HF/KE/DF and PF B/L- Poor effort vs weakness- hard to determine  Skin:    Comments: L abdominal incision from L of umbilicus to LLQ- staples intact- no drainage; a little puffy L groin /inguinal incision- from Mid inguinal to pubis- slightly moist- staples intact- no drainage seen- no erythema on either.   staples look good/incision looks good Neurological:      Mental Status: She is alert.     Comments: Pleasantly confused; vague, tangential- Ox1-2  Psychiatric:        Mood and Affect: Mood normal.        Behavior: Behavior normal.    Assessment/Plan: 1. Functional deficits which require 3+ hours per day of interdisciplinary therapy in a comprehensive inpatient rehab setting. Physiatrist is providing close team supervision and 24 hour management of active medical problems listed below. Physiatrist and rehab team continue to assess barriers to discharge/monitor patient progress toward functional and medical goals  Care Tool:  Bathing    Body parts bathed by patient: Right arm, Left arm, Chest, Abdomen, Right upper leg, Left upper leg, Face   Body parts bathed by helper: Right lower leg, Left lower leg, Front perineal area, Buttocks     Bathing assist Assist Level: Moderate Assistance - Patient 50 - 74%     Upper Body Dressing/Undressing Upper body dressing   What is the patient wearing?: Pull over shirt    Upper body assist Assist Level: Supervision/Verbal cueing    Lower Body Dressing/Undressing Lower body dressing      What is the patient wearing?: Incontinence brief,  Pants     Lower body assist Assist for lower body dressing: Maximal Assistance - Patient 25 - 49%     Toileting Toileting    Toileting assist Assist for toileting: Maximal Assistance - Patient 25 - 49% (bed pan)     Transfers Chair/bed transfer  Transfers assist  Chair/bed transfer activity did not occur: Refused        Locomotion Ambulation   Ambulation assist   Ambulation activity did not occur: Refused  Assist level: Minimal Assistance - Patient > 75% Assistive device: Walker-rolling Max distance: 150   Walk 10 feet activity   Assist  Walk 10 feet activity did not occur: Refused  Assist level: Minimal Assistance - Patient > 75% Assistive device: Walker-rolling   Walk 50 feet activity   Assist Walk 50 feet with 2 turns  activity did not occur: Refused  Assist level: Minimal Assistance - Patient > 75% Assistive device: Walker-rolling    Walk 150 feet activity   Assist Walk 150 feet activity did not occur: Refused  Assist level: Minimal Assistance - Patient > 75% Assistive device: Walker-rolling    Walk 10 feet on uneven surface  activity   Assist Walk 10 feet on uneven surfaces activity did not occur: Refused         Wheelchair     Assist Is the patient using a wheelchair?: Yes Type of Wheelchair: Manual Wheelchair activity did not occur: Refused         Wheelchair 50 feet with 2 turns activity    Assist    Wheelchair 50 feet with 2 turns activity did not occur: Refused       Wheelchair 150 feet activity     Assist  Wheelchair 150 feet activity did not occur: Refused       Blood pressure 137/64, pulse 76, temperature 99.2 F (37.3 C), temperature source Oral, resp. rate 18, height 5' 0.98" (1.549 m), weight 68.6 kg, SpO2 96 %.  Medical Problem List and Plan: 1. Functional deficits/debility secondary to aortic dissection -pt was very clear she panics when told "serious things"-              -patient may  shower- if covers incisions             -ELOS/Goals: 12-14 days- supervision  HFU scheduled.   Con't CIR- PT/OT and SLP 2.  Antithrombotics: -DVT/anticoagulation:  Pharmaceutical: Heparin [place] q 8 hours 2/6- will change to Lovenox since renal function OK             -antiplatelet therapy: n/a 3. Pain Management: Tylenol, Neurontin, Robaxin, oxycodone             -family said don't want her to have robaxin/gabapentin- will change to skelaxin and d/c gabapentin  2/6- will schedule oxycodone at 7:30 am daily to help with therapy participation.  4. Mood: LCSW to evaluate and provide emotional support             -antipsychotic agents: n/a 5. Neuropsych: This patient is capable of making decisions on her own behalf. 6. Skin/Wound Care: Routine skin care checks              -- Monitor LLQ, left groin incision 7. Fluids/Electrolytes/Nutrition: Routine ins and outs and follow-up chemistries             -- Heart healthy diet, thin liquids             --Glucose has been in normal range; DC SSI and  follow-up BMET 8. Blood loss anemia: Monitor CBC 9. Hypertension: continue Coreg, Norvasc, Cozaar 10. Hyperlipidemia: continue Lipitor 11: Tobacco use: Provide cessation counseling             --Nicotine patch ?             --Continue pulmonary toilet 12: COPD: Continue Yupelri nebulizer daily 13: GERD: continue Protonix 14: Hyperthyroidismmulti-nodular goiter:  --Dr. Loanne Drilling, endocrinologist>>not on methimazole currently --TSH = 0.022 1/20, normal T3, T4 this admission 15: History of pulmonary nodules: stable in size of 5 mm nodule on 1/18 as compared to 2016, outpatient follow-up.  16. Constipation: enema ordered. 2/6- only had 1 BM since here - will order sorbitol and add senokot 2 tabs daily-   17. Feels cold- will order kpad for feeling cold/pain 18. Abdominal pain: resolved with treatment of constipation 19. Flank pain: placed nursing order to ice 15 minutes three times per day 20. Leukocytosis: trending downward, repeat Thursday  2/6- Last WBC 10.7- is better- will recheck per primary team- had very low temp of 99.2- but hasn't recurred- could be blankets- piled on bed?  22. PICC- will d/c- could be source of infection and not taking any IV meds at all.    I spent a total of 35   minutes on total care today- >50% coordination of care- due to speaking with PT and IV nurse   LOS: 4 days A FACE TO FACE EVALUATION WAS PERFORMED  Sincere Liuzzi 11/25/2021, 8:39 AM

## 2021-11-25 NOTE — Progress Notes (Addendum)
°  Progress Note    11/25/2021 7:46 AM  Subjective:  says she doesn't feel well this morning but states her back pain is better  Tm 99.2  Vitals:   11/24/21 1924 11/25/21 0250  BP: (!) 141/71 137/64  Pulse: 78 76  Resp: 18 18  Temp: 98.3 F (36.8 C) 99.2 F (37.3 C)  SpO2: 100% 96%    Physical Exam: Cardiac:  regular Lungs:  non labored Incisions:  healing nicely left arm and abdominal incisions Extremities:  easily palpable bilateral radial pulses and left AT and right PT Abdomen:  soft, NT  CBC    Component Value Date/Time   WBC 10.7 (H) 11/22/2021 0639   RBC 2.96 (L) 11/22/2021 0639   HGB 9.2 (L) 11/22/2021 0639   HCT 29.0 (L) 11/22/2021 0639   PLT 111 (L) 11/22/2021 0639   MCV 98.0 11/22/2021 0639   MCH 31.1 11/22/2021 0639   MCHC 31.7 11/22/2021 0639   RDW 15.2 11/22/2021 0639   LYMPHSABS 2.0 11/22/2021 0639   MONOABS 1.1 (H) 11/22/2021 0639   EOSABS 0.2 11/22/2021 0639   BASOSABS 0.0 11/22/2021 0639    BMET    Component Value Date/Time   NA 137 11/22/2021 0639   K 3.8 11/22/2021 0639   CL 101 11/22/2021 0639   CO2 27 11/22/2021 0639   GLUCOSE 90 11/22/2021 0639   BUN 13 11/22/2021 0639   CREATININE 0.47 11/22/2021 0639   CALCIUM 8.6 (L) 11/22/2021 0639   GFRNONAA >60 11/22/2021 0639   GFRAA >60 01/14/2020 1124    INR    Component Value Date/Time   INR 1.1 11/11/2021 2147     Intake/Output Summary (Last 24 hours) at 11/25/2021 0746 Last data filed at 11/24/2021 1400 Gross per 24 hour  Intake 120 ml  Output --  Net 120 ml     Assessment/Plan:  71 y.o. female is s/p:  Thoracic branched endovascular aortic repair, left CIA and EIA stenting and left CIA to CFA bypass with 36mm ringed PTFE on 11/11/2021  -back pain improved -pt with palpable pedal pulses and radial pulses bilaterally -incisions are healing nicely -BP stable in the 315'Q-008'Q systolic -may be able to get staples out before pt discharges to home.  She has f/u appt on 2/28.     Leontine Locket, PA-C [facility] [phone] 11/25/2021 7:46 AM  VASCULAR STAFF ADDENDUM: I have independently interviewed and examined the patient. I agree with the above.   [name]. Stanford Breed, MD [facility] Phone Number: [phone] 11/25/2021 9:34 AM

## 2021-11-25 NOTE — Progress Notes (Signed)
Speech Language Pathology Daily Session Note  Patient Details  Name: Darlene Griffith MRN: [id number] Date of Birth: 11-May-1951  Today's Date: 11/25/2021 SLP Individual Time: 1105-1200 SLP Individual Time Calculation (min): 55 min  Short Term Goals: Week 1: SLP Short Term Goal 1 (Week 1): STG=LTG due to ELOS  Skilled Therapeutic Interventions: Skilled ST treatment focused on cognitive goals. Pt reported 8/10 chest pain and benefited from repositioning in recliner chair. RN aware of pain and medications last given 0925. Functional conversation on orientation and awareness of deficits appeared to distract from pain temporarily. Patient proceeded to require overall mod-to-max A verbal redirection for topic maintenance and was known to be verbose. Pt required sup A verbal cues for orientation to day of week + date, and sup A to refer to memory notebook with this information, as well as recalling events from earlier in the day/weekend. Pt required min A question cues to retain previously discussed information throughout session. Patient requested to call food/nutrition services for a specific lunch request. Pt utilized external aid with sup A cues to recall room number for food to be delivered. Daughter-in-law arrived toward end of session and patient appeared uplifted by this encounter. Pt stated she was feeling down but the support of her family has been helpful. Patient was left in recliner with alarm activated and immediate needs within reach at end of session. Continue per current plan of care.      Pain Pain Assessment Pain Scale: 0-10 Pain Score: 8  Faces Pain Scale: Hurts whole lot Pain Type: Acute pain Pain Location: Chest Pain Orientation: Right;Left Pain Descriptors / Indicators: Aching Pain Onset: On-going Pain Intervention(s): Medication (See eMAR);Relaxation;Distraction;Emotional support  Therapy/Group: Individual Therapy  Darlene Griffith 11/25/2021, 11:14 AM

## 2021-11-25 NOTE — Progress Notes (Addendum)
RN called to room to assess patient for continued complaints of pain. Patient indicated her chest, back, and ribs were 10/10 pain. No difficulty breathing noted, vital signs stable, Setzer and [place]orn notified and requested PA assessment. EKG order placed per verbal order. Oxy 10 mg and tylenol 650 mg administered per orders for pain and patient repositioned and reclined to further enhance comfort. Will continue to monitor for the duration of my shift.

## 2021-11-25 NOTE — Progress Notes (Signed)
Physical Therapy Session Note  Patient Details  Name: Darlene Griffith MRN: [id number] Date of Birth: 08-09-1951  Today's Date: 11/25/2021 PT Individual Time: 1031-1100 PT Individual Time Calculation (min): 29 min   Short Term Goals: Week 1:  PT Short Term Goal 1 (Week 1): STG=LTG due to ELOS   Skilled Therapeutic Interventions/Progress Updates:  Patient seated in recliner with LE elevated on entrance to room. Patient alert and agreeable to PT session. Does not know where her makeup and teeth are. Notes that she will have to ask her children.   Patient with c/o 8/10 constant pain in LLE from hip to knee throughout session. Pt asks what this therapist wants from her. Hope is that pt will be able to walk with therapist even if just around the room. Pt does not know what she will be able to do, but does not think she will be able to do that. Guided in sit<>stand transfer to RW from recliner. Requires CGA/ MinA to complete. Is able to stand for one minute. After standing, pt's main pain complaint moves to R lower back and wraps around abdomen to side. No numerical pain score provided but appears to be 10/10.   Pt continues to c/o pain in back. PA enters room to relate lab results to pt. Is concerned over pt's back pain. Therapist moves pt's heating pad to R side and lower back and repositions fewer pillows with increased recline to change pressure and tension to back. PA recommends possibly increasing pt's opioid medications and explains side effects. Also recommends addition of lidocaine patch.   Patient reclined in recliner and happy re: position at end of session with brakes locked, seat pad alarm set, and all needs within reach.     Therapy Documentation Precautions:  Precautions Precautions: Fall, Other (comment) Precaution Comments: Keep SBP <160 Restrictions Weight Bearing Restrictions: No General:   Pain:  Numerical rating of 8/10 for L upper leg pain. Then pain changes to faces pain  scale 10/10 at R lower back and side of abdomen.  Therapy/Group: Individual Therapy  Alger Simons PT, DPT 11/25/2021, 10:14 AM

## 2021-11-26 DIAGNOSIS — I7100 Dissection of unspecified site of aorta: Secondary | ICD-10-CM | POA: Diagnosis not present

## 2021-11-26 MED ORDER — MAGNESIUM HYDROXIDE 400 MG/5ML PO SUSP: 30.0000 mL | Freq: Every day | ORAL | Status: DC | PRN

## 2021-11-26 MED ORDER — TAPENTADOL HCL 50 MG PO TABS
50.0000 mg | ORAL_TABLET | Freq: Four times a day (QID) | ORAL | Status: DC | PRN
  Administered 2021-11-27 – 2021-11-28 (×2): 50 mg via ORAL
  Filled 2021-11-26 (×2): qty 1

## 2021-11-26 MED ORDER — SENNA 8.6 MG PO TABS
2.0000 | ORAL_TABLET | Freq: Every day | ORAL | Status: DC
  Administered 2021-11-27 – 2021-12-02 (×6): 17.2 mg via ORAL
  Filled 2021-11-26 (×5): qty 2

## 2021-11-26 MED ORDER — TAPENTADOL HCL ER 50 MG PO TB12: 50.0000 mg | ORAL_TABLET | Freq: Two times a day (BID) | ORAL | Status: DC

## 2021-11-26 MED ORDER — AMLODIPINE BESYLATE 5 MG PO TABS
5.0000 mg | ORAL_TABLET | Freq: Every day | ORAL | Status: DC
  Administered 2021-11-27: 5 mg via ORAL
  Filled 2021-11-26: qty 1

## 2021-11-26 NOTE — Progress Notes (Signed)
Physical Therapy Session Note  Patient Details  Name: Darlene Griffith MRN: [id number] Date of Birth: 1951-05-28  Today's Date: 11/26/2021 PT Individual Time: 8828-0034 PT Individual Time Calculation (min): 37 min  Today's Date: 11/26/2021 PT Missed Time: 23 Minutes Missed Time Reason: Patient fatigue;Pain;Patient unwilling to participate  Short Term Goals: Week 1:  PT Short Term Goal 1 (Week 1): STG=LTG due to ELOS  Skilled Therapeutic Interventions/Progress Updates:   Received pt slouched in bed with family present at bedside requesting therapist "give her a minute" to read text messages. Allowed time, then encouraged OOB mobility and change of scenery getting out of room. Pt c/o being "exhausted" from previous therapies and stated "If you just give me 10 minutes, I promise when you come back I'll work with you." Therapist returned 10 minutes later and pt stated "you came back at the wrong time" c/o increased pain along side of abdomen and ultimately refusing OOB mobility. Explained 3 hour rule and expectations for rehab to which pt responded with "well I'll tell you what, I'm not getting up right now", but with convincing was agreeable to therapist providing HEP. Provided pt with HEP and educated on frequency/duration/technique for the following exercises: -Supine Bridge - 1 x daily - 7 x weekly - 3 sets - 10 reps -Seated Long Arc Quad - 1 x daily - 7 x weekly - 3 sets - 10 reps -Seated March - 1 x daily - 7 x weekly - 3 sets - 10 reps -Supine Active Straight Leg Raise - 1 x daily - 7 x weekly - 3 sets - 10 reps -Standing Heel Raises - 1 x daily - 7 x weekly - 3 sets - 10 reps -Standing Hip Abduction with Counter Support - 1 x daily - 7 x weekly - 3 sets - 10 reps Offered to assist with repositioning in bed, but pt continued to declince. Concluded session with pt slouched in bed, needs within reach, and bed alarm on. 23 minutes missed of skilled physical therapy due to pain, failure, and  unwillingness to participate.   Therapy Documentation Precautions:  Precautions Precautions: Fall, Other (comment) Precaution Comments: Keep SBP <160 Restrictions Weight Bearing Restrictions: No  Therapy/Group: Individual Therapy Alfonse Alpers PT, DPT   11/26/2021, 7:40 AM

## 2021-11-26 NOTE — Progress Notes (Signed)
Speech Language Pathology Daily Session Note  Patient Details  Name: Darlene Griffith MRN: [id number] Date of Birth: May 08, 1951  Today's Date: 11/26/2021 SLP Individual Time: 0917-1000 SLP Individual Time Calculation (min): 43 min  Short Term Goals: Week 1: SLP Short Term Goal 1 (Week 1): STG=LTG due to ELOS  Skilled Therapeutic Interventions:Skilled ST services focused on cognitive skills. Pt was sitting up in chair when SLP entered and agreeable to participate in medication management task. Pt's daughter called and SLP communicated with, [name], via phone. SLP educated daughter that limited functional tasks have been completed in [facility] services due to pain impacting participation. SLP educated pt and [name] on use of pill organizer to aid in recall of medication and reduce errors. SLP facilitated mildly complex problem solving, error awareness and recall skills in BID pill organizer task. Pt required only supervision A verbal cues to complete first part of organizer, will complete the rest next session due to time. Pt expressed concerns taking medication, SLP provided education and recommend nursing continue with medication education. Pt required min A verbal cues for redirection to tasks due to internal distractions. Pt mentioned chronic pain several times, but was able to be redirect to today's task. Pt called nurse for headache medication as SLP was leaving. Pt was left in room with call bell within reach and chair alarm set. SLP recommends to continue skilled services.     Pain Pain Assessment Pain Scale: 0-10 Pain Score: 8  Faces Pain Scale: Hurts whole lot Pain Type: Acute pain Pain Location: Chest Pain Orientation: Left Pain Radiating Towards: breast Pain Descriptors / Indicators: Aching;Burning Pain Onset: On-going Pain Intervention(s): Medication (See eMAR)  Therapy/Group: Individual Therapy  Chloe Flis  [facility] 11/26/2021, 10:10 AM

## 2021-11-26 NOTE — Progress Notes (Signed)
Patient ID: Darlene Griffith, female   DOB: 08/15/1951, 71 y.o.   MRN: [id number] Daughter asking to speak with a Education officer, museum. Informed her Margreta Journey will be back tomorrow and can call her after team conference. She was fine with this and gave me her name Darlene Griffith and number [phone]. Will give Margreta Journey the message

## 2021-11-26 NOTE — Progress Notes (Signed)
Physical Therapy Session Note  Patient Details  Name: Darlene Griffith MRN: [id number] Date of Birth: Jun 15, 1951  Today's Date: 11/26/2021 PT Individual Time: 1105-1135 PT Individual Time Calculation (min): 30 min   Short Term Goals: Week 1:  PT Short Term Goal 1 (Week 1): STG=LTG due to ELOS  Skilled Therapeutic Interventions/Progress Updates:    Pt received supine in bed with her daughter-in-law present. Pt initially not agreeable to therapy session reporting pain from where the nurse gave her the heparin shot in her abdomen - pt adamantly reports that the nurse gave her the shot "directly in my belly button." Therapist provided emotional support and redirection. With encouragement and time pt agreeable to therapy session stating "I don't want to, but I'll do what you want." Supine>sitting R EOB, HOB partially elevated and using bedrail with supervision and increased time. Pt impulsively stood up and waked ~54ft to transfer into wheelchair, no AD, while stating "I'm not going to let you put that belt on me until I'm in the chair" referring to the gait belt - pt completed the transfer with CGA for safety. Then, pt agreeable to therapist donning gait belt.  Transported to/from gym in w/c for time management and energy conservation. Sit<>stands using RW with CGA for safety - pt keeps both hands up on RW during the transition. Gait training ~31ft x2 using RW with CGA for safety - pt ambulates with significantly slow gait speed, short B LE step lengths and decreased B LE foot clearances with overall slow speed of movements. Pt enjoys talking about her dog, Janace Hoard, as a form of distraction and to encourage increased participation in therapy. Transported back to room. Despite encouragement, pt not agreeable to remain sitting up at this time but does state she will make it her goal to sit up in chair 3-4 hours tomorrow. Short distance ~4ft ambulatory transfer to EOB using RW with CGA. Sit>supine, HOB elevated and  using bedrail, with supervision and increased time with pt moving very slowly and controlled while stating "dear Reita Cliche, help me." Upon lying in bed, pt reports sudden onset of sharp pain in her side - therapist provided emotional support and reminded pt that she received pain medication during session and that should help with her pain. At end of session, pt left supine in bed with needs in reach, bed alarm on, her daughter-in-law present, and per pt's request 4 bedrails up.  Therapy Documentation Precautions:  Precautions Precautions: Fall, Other (comment) Precaution Comments: Keep SBP <160 Restrictions Weight Bearing Restrictions: No   Pain: At end of session , pt reporting sudden onset R side flank pain - nurse present during session to provide pain medication - therapist provided rest breaks, repositioning, distraction, and emotional support for pain management.   Therapy/Group: Individual Therapy  Tawana Scale , PT, DPT, NCS, CSRS  11/26/2021, 8:00 AM

## 2021-11-26 NOTE — Progress Notes (Signed)
PROGRESS NOTE   Subjective/Complaints: Continues to have severe pain and constipation On bedside commode today Ambulated 70 feet with therapy! Incision healing well.   ROS:  Pt denies SOB, abd pain, CP, N/V/(+) C/D, and vision changes, +postoperative pain   Objective:   No results found. No results for input(s): WBC, HGB, HCT, PLT in the last 72 hours.  No results for input(s): NA, K, CL, CO2, GLUCOSE, BUN, CREATININE, CALCIUM in the last 72 hours.   Intake/Output Summary (Last 24 hours) at 11/26/2021 1338 Last data filed at 11/26/2021 0900 Gross per 24 hour  Intake 60 ml  Output --  Net 60 ml        Physical Exam: Vital Signs Blood pressure (!) 104/57, pulse 61, temperature 97.8 F (36.6 C), resp. rate 18, height 5' 0.98" (1.549 m), weight 68.6 kg, SpO2 100 %.    General: awake, alert, appropriate, sitting EOB; IV nurse and PT in room; NAD HENT: conjugate gaze; oropharynx moist CV: regular rate; no JVD Pulmonary: CTA B/L; no W/R/R- good air movement GI: soft, NT except over lower abd from bruising/incisions; somewhat distended; hypoactive BS   Psychiatric: appropriate Neurological: alert- poor memory  Musculoskeletal:        General: No swelling or deformity.     Cervical back: Neck supple. No tenderness.     Comments: 4+/5 in Biceps, triceps, WE, grip and FA B/L  4+/5 in HF/KE/DF and PF B/L- Poor effort vs weakness- hard to determine  Skin:    Comments: L abdominal incision from L of umbilicus to LLQ- staples intact- no drainage; a little puffy L groin /inguinal incision- from Mid inguinal to pubis- slightly moist- staples intact- no drainage seen- no erythema on either.   staples look good/incision looks good Neurological:     Mental Status: She is alert.     Comments: Pleasantly confused; vague, tangential- Ox1-2  Psychiatric:        Mood and Affect: Mood normal.        Behavior: Behavior normal.     Assessment/Plan: 1. Functional deficits which require 3+ hours per day of interdisciplinary therapy in a comprehensive inpatient rehab setting. Physiatrist is providing close team supervision and 24 hour management of active medical problems listed below. Physiatrist and rehab team continue to assess barriers to discharge/monitor patient progress toward functional and medical goals  Care Tool:  Bathing    Body parts bathed by patient: Right arm, Left arm, Chest, Abdomen, Front perineal area, Buttocks, Face   Body parts bathed by helper: Right lower leg, Left lower leg, Front perineal area, Buttocks     Bathing assist Assist Level: Contact Guard/Touching assist     Upper Body Dressing/Undressing Upper body dressing   What is the patient wearing?: Pull over shirt, Bra    Upper body assist Assist Level: Minimal Assistance - Patient > 75%    Lower Body Dressing/Undressing Lower body dressing      What is the patient wearing?: Incontinence brief, Pants     Lower body assist Assist for lower body dressing: Maximal Assistance - Patient 25 - 49%     Toileting Toileting    Toileting assist Assist for toileting:  Minimal Assistance - Patient > 75%     Transfers Chair/bed transfer  Transfers assist  Chair/bed transfer activity did not occur: Refused  Chair/bed transfer assist level: Contact Guard/Touching assist Chair/bed transfer assistive device: Programmer, multimedia   Ambulation assist   Ambulation activity did not occur: Refused  Assist level: Contact Guard/Touching assist Assistive device: Walker-rolling Max distance: 70ft   Walk 10 feet activity   Assist  Walk 10 feet activity did not occur: Refused  Assist level: Minimal Assistance - Patient > 75% Assistive device: Walker-rolling   Walk 50 feet activity   Assist Walk 50 feet with 2 turns activity did not occur: Refused  Assist level: Minimal Assistance - Patient > 75% Assistive  device: Walker-rolling    Walk 150 feet activity   Assist Walk 150 feet activity did not occur: Refused  Assist level: Minimal Assistance - Patient > 75% Assistive device: Walker-rolling    Walk 10 feet on uneven surface  activity   Assist Walk 10 feet on uneven surfaces activity did not occur: Refused         Wheelchair     Assist Is the patient using a wheelchair?: Yes Type of Wheelchair: Manual Wheelchair activity did not occur: Refused         Wheelchair 50 feet with 2 turns activity    Assist    Wheelchair 50 feet with 2 turns activity did not occur: Refused       Wheelchair 150 feet activity     Assist  Wheelchair 150 feet activity did not occur: Refused       Blood pressure (!) 104/57, pulse 61, temperature 97.8 F (36.6 C), resp. rate 18, height 5' 0.98" (1.549 m), weight 68.6 kg, SpO2 100 %.  Medical Problem List and Plan: 1. Functional deficits/debility secondary to aortic dissection -pt was very clear she panics when told "serious things"-              -patient may  shower- if covers incisions             -ELOS/Goals: 12-14 days- supervision  HFU scheduled.   Continue CIR- PT/OT and SLP 2.  Antithrombotics: -DVT/anticoagulation:  Pharmaceutical: Heparin [place] q 8 hours 2/6- will change to Lovenox since renal function OK             -antiplatelet therapy: n/a 3. Pain Management: Tylenol, Neurontin, Robaxin, oxycodone             -family said don't want her to have robaxin/gabapentin- will change to skelaxin and d/c gabapentin  2/6- will schedule oxycodone at 7:30 am daily to help with therapy participation.   2/7: add nucynta 47m BID 4. Mood: LCSW to evaluate and provide emotional support             -antipsychotic agents: n/a 5. Neuropsych: This patient is capable of making decisions on her own behalf. 6. Skin/Wound Care: Routine skin care checks             -- Monitor LLQ, left groin incision 7. Fluids/Electrolytes/Nutrition:  Routine ins and outs and follow-up chemistries             -- Heart healthy diet, thin liquids             --Glucose has been in normal range; DC SSI and follow-up BMET 8. Blood loss anemia: Monitor CBC 9. Hypertension: continue Coreg, Norvasc, Cozaar 10. Hyperlipidemia: continue Lipitor 11: Tobacco use: Provide cessation counseling             --  Nicotine patch ?             --Continue pulmonary toilet 12: COPD: Continue Yupelri nebulizer daily 13: GERD: continue Protonix 14: Hyperthyroidismmulti-nodular goiter:  --Dr. Loanne Drilling, endocrinologist>>not on methimazole currently --TSH = 0.022 1/20, normal T3, T4 this admission 15: History of pulmonary nodules: stable in size of 5 mm nodule on 1/18 as compared to 2016, outpatient follow-up.  16. Constipation: add daily milk of mag prn 17. Feels cold- will order kpad for feeling cold/pain 18. Abdominal pain: resolved with treatment of constipation 19. Flank pain: placed nursing order to ice 15 minutes three times per day 20. Leukocytosis: trending downward, repeat Thursday  2/6- Last WBC 10.7- is better- will recheck per primary team- had very low temp of 99.2- but hasn't recurred- could be blankets- piled on bed? 21. Hypotension: decrease amlodipine to 5mg     LOS: 5 days A FACE TO FACE EVALUATION WAS PERFORMED  Darlene Griffith 11/26/2021, 1:38 PM

## 2021-11-26 NOTE — Progress Notes (Signed)
Occupational Therapy Session Note  Patient Details  Name: Darlene Griffith MRN: [id number] Date of Birth: [birth year]  Today's Date: 11/26/2021 OT Individual Time: 1423-9532 OT Individual Time Calculation (min): 70 min    Short Term Goals: Week 1:  OT Short Term Goal 1 (Week 1): STG = LTG due to ELOS  Skilled Therapeutic Interventions/Progress Updates:  Skilled OT intervention completed with focus on self-care, pain management, toileting transfers. Pt received supine in bed, with environmental services present, with pt sharing a time of religious prayer/worship with staff, with therapist supporting pt in her pain management strategy. Pt found to not have touched her breakfast, with therapist assisting in setting up her food to encourage pt to eat. Pt ate a few bites, while therapist retrieved a w/c from storage to encourage out of room activities for therapy participation and increased orientation, as pt has declined several therapies. Note- pt very tangential and slow throughout entire session, requiring re-directive cues. Pt completed bed mobility with HOB at 90 degrees, with supervision. Pt completed sit > stand and short ambulatory transfer without AD to w/c with CGA as pt preferring to hold her breasts up due to "heavy feeling". Seated at sink, pt required initiation cues for washing, but able to complete UB bathing with supervision. Pt reporting need to void, with ambulatory transfer without AD and CGA to [facility], with pt having continent episode of urine only. MD in room for rounds, with therapist threading new brief/pants with Max A for time management. Pt donned bra/shirt with min A due to pain. Pt able to complete pericare in stance without AD with CGA for balance only, then min A for donning pants over hips. Pt ambulated to recliner without AD and CGA, with one episode of pt being off balance with light min A required to correct. Pt left seated in recliner with pillows and k-pad for back comfort,  chair alarm on, breakfast in front of pt, and all other needs in reach/met at end of session.   Therapy Documentation Precautions:  Precautions Precautions: Fall, Other (comment) Precaution Comments: Keep SBP <160 Restrictions Weight Bearing Restrictions: No  Pain: Unrated pain in chest/heavy breasts, premedicated, nursing aware   Therapy/Group: Individual Therapy  Tiajah Oyster E Leilyn Frayre 11/26/2021, 7:31 AM

## 2021-11-27 ENCOUNTER — Telehealth: Payer: Self-pay

## 2021-11-27 DIAGNOSIS — I7100 Dissection of unspecified site of aorta: Secondary | ICD-10-CM | POA: Diagnosis not present

## 2021-11-27 LAB — TROPONIN I (HIGH SENSITIVITY)
Troponin I (High Sensitivity): 3 ng/L (ref <18)
Troponin I (High Sensitivity): 3 ng/L (ref <18)

## 2021-11-27 MED ORDER — NITROGLYCERIN 0.4 MG SL SUBL
0.4000 mg | SUBLINGUAL_TABLET | SUBLINGUAL | Status: DC | PRN
  Administered 2021-11-27 – 2021-12-03 (×21): 0.4 mg via SUBLINGUAL
  Filled 2021-11-27 (×17): qty 1

## 2021-11-27 MED ORDER — AMLODIPINE BESYLATE 10 MG PO TABS
10.0000 mg | ORAL_TABLET | Freq: Every day | ORAL | Status: DC
  Administered 2021-11-28 – 2021-12-03 (×6): 10 mg via ORAL
  Filled 2021-11-27 (×6): qty 1

## 2021-11-27 NOTE — Progress Notes (Signed)
Occupational Therapy Session Note  Patient Details  Name: Darlene Griffith MRN: [id number] Date of Birth: [birth year]  Today's Date: 11/27/2021 OT Individual Time: 9417-4081 OT Individual Time Calculation (min): 38 min  and Today's Date: 11/27/2021 OT Missed Time: 88 Minutes Missed Time Reason: Pain   Short Term Goals: Week 1:  OT Short Term Goal 1 (Week 1): STG = LTG due to ELOS  Skilled Therapeutic Interventions/Progress Updates:  Pt received upright in bed, with DIL present. Pt complaining of  >10/10 pain in chest, with MD and RN already aware. Pt extremely verbose, with increased time needed for simple answers. Pt adamant about therapist rechecking in with nurse, with nurse coming to room to provide applicable meds. DIL had several questions about DME, pt's CLOF, and goals. Therapist provided education to both pt and DIL about DME that therapist would recommend and it's function with demonstration provided, however advised that pt complete tub/shower transfer with therapist prior to attempting at home. Pt stating "I'm going to get in that shower regardless." Therapist again advising that the shower transfer be completed in rehab, or at the very least to wait until a [place] is present. Educated pt on [place] purpose and role. This therapist reminded the pt and pt's DIL about the 3 refusal policy = automatic discharge as pt has refused PT primarily and now how refused any OOB or bed level therapy with OT this session, due to pain. Pt declined repositioning, bed level self-care, exercises or pain management techniques. Pt left upright in bed with bed alarm on, RN and DIL present, and all needs in reach at end of session.   Therapy Documentation Precautions:  Precautions Precautions: Fall, Other (comment) Precaution Comments: Keep SBP <160 Restrictions Weight Bearing Restrictions: No    Therapy/Group: Individual Therapy  Darlene Griffith 11/27/2021, 7:50 AM

## 2021-11-27 NOTE — Plan of Care (Signed)
°  Problem: RH Memory Goal: LTG Patient will demonstrate ability for day to day (SLP) Description: LTG:   Patient will demonstrate ability for day to day recall/carryover during cognitive/linguistic activities with assist  (SLP) Flowsheets (Taken 11/27/2021 1153) LTG: Patient will demonstrate ability for day to day recall/carryover during cognitive/linguistic activities with assist (SLP): (downgraded due to limited participation and poor carryover) Minimal Assistance - Patient > 75% Note: Downgraded due to limited participation and poor carryover

## 2021-11-27 NOTE — Progress Notes (Addendum)
Patient ID: Darlene Griffith, female   DOB: [birth year], 71 y.o.   MRN: [id number]  Rolling walker and Transfer Bench ordered through [place].

## 2021-11-27 NOTE — Progress Notes (Signed)
Speech Language Pathology Daily Session Note  Patient Details  Name: Darlene Griffith MRN: [id number] Date of Birth: 1951-02-06  Today's Date: 11/27/2021 SLP Individual Time: 1308-6578 SLP Individual Time Calculation (min): 44 min  Short Term Goals: Week 1: SLP Short Term Goal 1 (Week 1): STG=LTG due to ELOS  Skilled Therapeutic Interventions: Skilled ST services focused on cognitive skills. Pt expressed "110" pain when asked to rate back/chest pain. Pt supported consuming pain medication this am, but had not consumed breakfast. SLP repositioned pt in bed for PO consumption, pt consumed a minimal amount expressing due to changes in taste and pain. Pt was unable to recall ST events from yesterday, but recalled memory notebook with supervision A verbal cues and ability to read events with supervision A verbal cues. Pt requested to not complete pill organizer in today's session due to increased pain with movement. Pt was able to answer mildly complex problem solving questions pertaining to medication consumed 2-4 times per day and then 2 "daily math" questions from [employer] with supervision A verbal cues for errors, before functional therapy ended due to pt request. Pt requested to speak to an environmental services staff member as she passed by the room and requested SLP leave so they could pray. SLP provided education pertaining to need to participate in ST services, but pt politely declined. Pt was in room with staff member. Recommend to continue ST services if pt participate in treatment session. Pt missed 15 minutes due to refusal.      Pain Pain Assessment Faces Pain Scale: Hurts worst Pain Type: Acute pain Pain Location: Chest Pain Onset: On-going Pain Intervention(s): Repositioned;Prayer;Relaxation  Therapy/Group: Individual Therapy  Roben Tatsch  [facility] 11/27/2021, 8:56 AM

## 2021-11-27 NOTE — Progress Notes (Shared)
Speech Language Pathology Discharge Summary  Patient Details  Name: Darlene Griffith MRN: [id number] Date of Birth: [birth year]  {chl ip rehab slp time calculations:[id number]}   Skilled Therapeutic Interventions:  ***     Patient has met 5 of 5 long term goals.  Patient to discharge at overall Supervision;Min level.  Reasons goals not met:     Clinical Impression/Discharge Summary:   Pt made good progress meeting 5 out 5 goals discharging at min-supervision A. Pt's goals were downgrade during CIR course of stay due to limited participation, pt supports due to pain. SLP facilitated short term recall initiating memory notebook, functional problem solving to address error awareness in medication management tasks.... Barriers at d/c are self-limiting behaviors/impacting by pain, short term recall, emergent/safety awareness and selective attention. Pt benefited from skilled ST services in order to maximize functional independence and reduce burden of care, requiring 24 hour supervision at discharge with continued skilled ST services.  Care Partner:  Caregiver Able to Provide Assistance: Yes  Type of Caregiver Assistance: Physical;Cognitive  Recommendation:  Home Health SLP;24 hour supervision/assistance;Outpatient SLP  Rationale for SLP Follow Up: Maximize functional communication;Maximize cognitive function and independence;Reduce caregiver burden   Equipment: N/A   Reasons for discharge: Discharged from hospital   Patient/Family Agrees with Progress Made and Goals Achieved: Yes    Deke Tilghman  [facility] 11/27/2021, 11:56 AM

## 2021-11-27 NOTE — Progress Notes (Signed)
PROGRESS NOTE   Subjective/Complaints: C/o 110 chest pain- had relief with nitroglycerin that was shortlived- felt like elephants on her chest. Troponin 3, EKG with premature ventricular complexes-otherwise sinus rhythm. Oxycodone does not help. She would like another nitroglycerin.    ROS:  Pt denies SOB, abd pain, N/V/(+) C/D, and vision changes, +postoperative pain, +chest pain   Objective:   No results found. No results for input(s): WBC, HGB, HCT, PLT in the last 72 hours.  No results for input(s): NA, K, CL, CO2, GLUCOSE, BUN, CREATININE, CALCIUM in the last 72 hours.   Intake/Output Summary (Last 24 hours) at 11/27/2021 1139 Last data filed at 11/26/2021 1900 Gross per 24 hour  Intake 220 ml  Output --  Net 220 ml        Physical Exam: Vital Signs Blood pressure (!) 150/67, pulse 91, temperature 97.6 F (36.4 C), resp. rate 18, height 5' 0.98" (1.549 m), weight 68.6 kg, SpO2 97 %.  Gen: no distress, normal appearing HEENT: oral mucosa pink and moist, NCAT Cardio: Reg rate Chest: normal effort, normal rate of breathing GI: soft, NT except over lower abd from bruising/incisions; somewhat distended; hypoactive BS   Psychiatric: appropriate Neurological: alert- poor memory  Musculoskeletal:        General: No swelling or deformity.     Cervical back: Neck supple. No tenderness.     Comments: 4+/5 in Biceps, triceps, WE, grip and FA B/L  4+/5 in HF/KE/DF and PF B/L- Poor effort vs weakness- hard to determine  Skin:    Comments: L abdominal incision from L of umbilicus to LLQ- staples intact- no drainage; a little puffy L groin /inguinal incision- from Mid inguinal to pubis- slightly moist- staples intact- no drainage seen- no erythema on either.   staples look good/incision looks good Neurological:     Mental Status: She is alert.     Comments: Pleasantly confused; vague, tangential- Ox1-2  Psychiatric:         Mood and Affect: Mood normal.        Behavior: Behavior normal.    Assessment/Plan: 1. Functional deficits which require 3+ hours per day of interdisciplinary therapy in a comprehensive inpatient rehab setting. Physiatrist is providing close team supervision and 24 hour management of active medical problems listed below. Physiatrist and rehab team continue to assess barriers to discharge/monitor patient progress toward functional and medical goals  Care Tool:  Bathing    Body parts bathed by patient: Right arm, Left arm, Chest, Abdomen, Front perineal area, Buttocks, Face   Body parts bathed by helper: Right lower leg, Left lower leg, Front perineal area, Buttocks     Bathing assist Assist Level: Contact Guard/Touching assist     Upper Body Dressing/Undressing Upper body dressing   What is the patient wearing?: Pull over shirt, Bra    Upper body assist Assist Level: Minimal Assistance - Patient > 75%    Lower Body Dressing/Undressing Lower body dressing      What is the patient wearing?: Incontinence brief, Pants     Lower body assist Assist for lower body dressing: Maximal Assistance - Patient 25 - 49%     Toileting Toileting  Toileting assist Assist for toileting: Minimal Assistance - Patient > 75%     Transfers Chair/bed transfer  Transfers assist  Chair/bed transfer activity did not occur: Refused  Chair/bed transfer assist level: Contact Guard/Touching assist Chair/bed transfer assistive device: Programmer, multimedia   Ambulation assist   Ambulation activity did not occur: Refused  Assist level: Contact Guard/Touching assist Assistive device: Walker-rolling Max distance: 27ft   Walk 10 feet activity   Assist  Walk 10 feet activity did not occur: Refused  Assist level: Minimal Assistance - Patient > 75% Assistive device: Walker-rolling   Walk 50 feet activity   Assist Walk 50 feet with 2 turns activity did not occur:  Refused  Assist level: Minimal Assistance - Patient > 75% Assistive device: Walker-rolling    Walk 150 feet activity   Assist Walk 150 feet activity did not occur: Refused  Assist level: Minimal Assistance - Patient > 75% Assistive device: Walker-rolling    Walk 10 feet on uneven surface  activity   Assist Walk 10 feet on uneven surfaces activity did not occur: Refused         Wheelchair     Assist Is the patient using a wheelchair?: Yes Type of Wheelchair: Manual Wheelchair activity did not occur: Refused         Wheelchair 50 feet with 2 turns activity    Assist    Wheelchair 50 feet with 2 turns activity did not occur: Refused       Wheelchair 150 feet activity     Assist  Wheelchair 150 feet activity did not occur: Refused       Blood pressure (!) 150/67, pulse 91, temperature 97.6 F (36.4 C), resp. rate 18, height 5' 0.98" (1.549 m), weight 68.6 kg, SpO2 97 %.  Medical Problem List and Plan: 1. Functional deficits/debility secondary to aortic dissection -pt was very clear she panics when told "serious things"-              -patient may  shower- if covers incisions             -ELOS/Goals: 12-14 days- supervision  HFU scheduled.   Continue CIR- PT/OT and SLP 2.  Antithrombotics: -DVT/anticoagulation:  Pharmaceutical: Heparin [place] q 8 hours 2/6- will change to Lovenox since renal function OK             -antiplatelet therapy: n/a 3. Pain Management: Tylenol, Neurontin, Robaxin, oxycodone             -family said don't want her to have robaxin/gabapentin- will change to skelaxin and d/c gabapentin  2/6- will schedule oxycodone at 7:30 am daily to help with therapy participation.   2/7: add nucynta 53m BID 4. Mood: LCSW to evaluate and provide emotional support             -antipsychotic agents: n/a 5. Neuropsych: This patient is capable of making decisions on her own behalf. 6. Skin/Wound Care: Routine skin care checks             --  Monitor LLQ, left groin incision 7. Fluids/Electrolytes/Nutrition: Routine ins and outs and follow-up chemistries             -- Heart healthy diet, thin liquids             --Glucose has been in normal range; DC SSI and follow-up BMET 8. Blood loss anemia: Monitor CBC 9. Hypertension: continue Coreg, Norvasc, Cozaar. Increase amlodipine to 10mg  10. Hyperlipidemia: continue Lipitor 11:  Tobacco use: Provide cessation counseling             --Nicotine patch ?             --Continue pulmonary toilet 12: COPD: Continue Yupelri nebulizer daily 13: GERD: continue Protonix 14: Hyperthyroidismmulti-nodular goiter:  --Dr. Loanne Drilling, endocrinologist>>not on methimazole currently --TSH = 0.022 1/20, normal T3, T4 this admission 15: History of pulmonary nodules: stable in size of 5 mm nodule on 1/18 as compared to 2016, outpatient follow-up.  16. Constipation: add daily milk of mag prn 17. Feels cold- will order kpad for feeling cold/pain 18. Abdominal pain: resolved with treatment of constipation 19. Flank pain: placed nursing order to ice 15 minutes three times per day 20. Leukocytosis: trending downward, repeat Thursday  2/6- Last WBC 10.7- is better- will recheck per primary team- had very low temp of 99.2- but hasn't recurred- could be blankets- piled on bed? 21. Chest pain: nitroglycerin ordered. 22. EKG shows premature ventricular complexes, outpatient follow-up with cardiology.     LOS: 6 days A FACE TO FACE EVALUATION WAS PERFORMED  Clide Deutscher Ledarius Leeson 11/27/2021, 11:39 AM

## 2021-11-27 NOTE — Progress Notes (Signed)
Patient ID: Darlene Griffith, female   DOB: 08/17/1951, 71 y.o.   MRN: [id number]  Sw received call from patient daughter Darlene Griffith). Patient daughter upset due to events taken place over the patient hospital stay. Sw will provide patient daughter with the patient advocate information. Daughter requesting a call from Dr. Roselie Awkward, message sent and message call into the office with patient daughter information. Sw will cont to follow up

## 2021-11-27 NOTE — Patient Care Conference (Signed)
Inpatient RehabilitationTeam Conference and Plan of Care Update Date: 11/27/2021   Time: 11:35 AM    Patient Name: Darlene Griffith      Medical Record Number: [id number]  Date of Birth: 01/21/1951 Sex: Female         Room/Bed: 4W20C/4W20C-01 Payor Info: Payor: [place] / Plan: BCBS COMM PPO / Product Type: *No Product type* /    Admit Date/Time:  11/21/2021  2:28 PM  Primary Diagnosis:  Aortic dissection [facility])  Hospital Problems: Principal Problem:   Aortic dissection [facility])    Expected Discharge Date: Expected Discharge Date: 12/01/21  Team Members Present: Physician leading conference: Dr. Leeroy Cha Social Worker Present: Erlene Quan, BSW Nurse Present: Dorien Chihuahua, RN PT Present: Becky Sax, PT OT Present: Other (comment) [facility] Alphonsa Gin, OT) SLP Present: Charolett Bumpers, SLP PPS Coordinator present : Gunnar Fusi, SLP     Current Status/Progress Goal Weekly Team Focus  Bowel/Bladder   cont of B/B. last BM 2/6  remain cont.  assess q shift and PRN   Swallow/Nutrition/ Hydration             ADL's   min A bathing, Mod A LB dressing, min A toileting, CGA ambulatory transfers with/without RW  supervision  activity tolerance, ADL retraining, endurance   Mobility   bed mobility min A, transfers with RW CGA, gait 127ft with RW min A  supervision, min A steps  functional mobility/transfers, generalized strengthening, dynamic standing balance/coordination, gait training, D/C planning.   Communication             Safety/Cognition/ Behavioral Observations  mod I orientation, Min-Supervision A, limited by pain but participating the last session  Mod I orientation, Supervision A  memory notebook, functional mildly complex problem solving, error awareness and selective attention   Pain   c/o pain 9 of 10 to surgical site- abdomen. pain managmant in progress- effective  pain<3  assess pain level q shift and PRN   Skin   surgical incisions  proper  healing , no signs of infection  assess skin q shift and PRN     Discharge Planning:  family reports staying with patient at discharge with daughters to provide supervision   Team Discussion: Patient with multiple pain issues; pain addressed. Constipation addressed. Progress limited by impulsivity, cognitive issues and poor carryover and limited participation with therapy.  Patient on target to meet rehab goals: yes, currently CGA for ambulation with RW and able to ambulate up to 150' with CGA. Goals for discharge set for supervision overall. Min assist for steps.   *See Care Plan and progress notes for long and short-term goals.   Revisions to Treatment Plan:  N/A  Teaching Needs: Safety, medications, skin care, transfers, toileting, etc.  Current Barriers to Discharge: Decreased caregiver support and Home enviroment access/layout  Possible Resolutions to Barriers: Family education DME: RW HH follow up services recommended     Medical Summary Current Status: overweight, verbose and perseverative, severe pain at incisional site and also chest pain today, cognitive deficits, fluctuating blood pressure  Barriers to Discharge: Weight;Medical stability  Barriers to Discharge Comments: overweight, verbose and perseverative, severe pain at incisional site and also chest pain today, cognitive deficits, fluctuating blood pressure Possible Resolutions to [employer] Focus: provide dietary education EKG/troponin/nitroglycerin today, added nucynta, continue to monitor blood pressure TID   Continued Need for Acute Rehabilitation Level of Care: The patient requires daily medical management by a physician with specialized training in physical medicine and rehabilitation  for the following reasons: Direction of a multidisciplinary physical rehabilitation program to maximize functional independence : Yes Medical management of patient stability for increased activity during participation in an  intensive rehabilitation regime.: Yes Analysis of laboratory values and/or radiology reports with any subsequent need for medication adjustment and/or medical intervention. : Yes   I attest that I was present, lead the team conference, and concur with the assessment and plan of the team.   Dorien Chihuahua B 11/27/2021, 11:40 AM

## 2021-11-27 NOTE — Progress Notes (Signed)
Patient ID: Darlene Griffith, female   DOB: [birth year], 72 y.o.   MRN: [id number] Team Conference Report to Patient/Family  Team Conference discussion was reviewed with the patient and caregiver, including goals, any changes in plan of care and target discharge date.  Patient and caregiver express understanding and are in agreement.  The patient has a target discharge date of 12/01/21.  Sw spoke to patient daughter Darlene Griffith) provided team conference updates and addressed questions and concerns. Patient daughter asking to have patient transferred to [place] informed pt daughter of restrictions of IP to IP tranfers. SW informed pt daughter of patient self-limiting, missing therapy time and requiring heavy encouragement. Daughter asked if more participation would allot more time on CIR, sw inform daughter that decision is up to the physician and progress with therapy. No additional questions or concerns.  Dyanne Iha 11/27/2021, 1:54 PM

## 2021-11-27 NOTE — Progress Notes (Signed)
Physical Therapy Session Note  Patient Details  Name: MALACHI SUDERMAN MRN: [id number] Date of Birth: [birth year]  Today's Date: 11/27/2021  Short Term Goals: Week 1:  PT Short Term Goal 1 (Week 1): STG=LTG due to ELOS  Skilled Therapeutic Interventions/Progress Updates:    Attempted to see patient for scheduled therapy session. Pt received seated in bed, declines participation in therapy session at this time due to pain. Pt reports she has taken pain medication with no relief. Offered repositioning for patient to more comfortable position, she declines due to pain and reports speaking is increasing her back pain. Pt missed 45 min of scheduled therapy session due to refusal 2/2 pain.  Therapy Documentation Precautions:  Precautions Precautions: Fall, Other (comment) Precaution Comments: Keep SBP <160 Restrictions Weight Bearing Restrictions: No General: PT Amount of Missed Time (min): 45 Minutes PT Missed Treatment Reason: Pain;Patient unwilling to participate      Therapy/Group: Individual Therapy   Excell Seltzer, PT, DPT, CSRS  11/27/2021, 3:25 PM

## 2021-11-27 NOTE — Progress Notes (Signed)
Physical Therapy Session Note  Patient Details  Name: Darlene Griffith MRN: [id number] Date of Birth: 10/24/1950  Today's Date: 11/27/2021 PT Individual Time: 3570-1779 PT Individual Time Calculation (min): 25 min   Short Term Goals: Week 1:  PT Short Term Goal 1 (Week 1): STG=LTG due to ELOS  Skilled Therapeutic Interventions/Progress Updates:   Received pt semi-reclined in bed. Before therapist fully entered room pt said "I can't do anything today" and reported constant pain "120/10" in chest and in L side of abdomen. Pt reports feeling like "someone is punching her in her chest" - notified MD and RN - offered repositioning, but pt declined. Pt also reported feeling nauseous and dizzy. BP: 139/71 taken on R leg. Pt's family arrived and briefly discussed D/C plan. Per pt/family report, pt has 2 3in steps to enter home and +1 step into door with no railings to hold onto. Encouraged practicing stairs to simulate home entry, however refused. Emphasized importance of having 24/7 supervision and assist with stair navigation and pt/family verbalized understanding. Pt frequently crying out "I can't be by myself" and discussed potential option for pt to D/C home with daughter in law. Concluded session with pt semi-reclined in bed, needs within reach, and bed alarm on. Family present at bedside.   Therapy Documentation Precautions:  Precautions Precautions: Fall, Other (comment) Precaution Comments: Keep SBP <160 Restrictions Weight Bearing Restrictions: No  Therapy/Group: Individual Therapy Alfonse Alpers PT, DPT   11/27/2021, 7:41 AM

## 2021-11-27 NOTE — Telephone Encounter (Signed)
I spoke with patient's daughter and advised that her short term disability forms have been completed and are ready for pick up. [name] (Daughter) states she will stop by our office to pick up forms.

## 2021-11-28 ENCOUNTER — Inpatient Hospital Stay (HOSPITAL_COMMUNITY): Payer: BC Managed Care – PPO

## 2021-11-28 LAB — CBC
HCT: 33.2 % — ABNORMAL LOW (ref 36.0–46.0)
Hemoglobin: 10.8 g/dL — ABNORMAL LOW (ref 12.0–15.0)
MCH: 31.5 pg (ref 26.0–34.0)
MCHC: 32.5 g/dL (ref 30.0–36.0)
MCV: 96.8 fL (ref 80.0–100.0)
Platelets: 188 10*3/uL (ref 150–400)
RBC: 3.43 MIL/uL — ABNORMAL LOW (ref 3.87–5.11)
RDW: 15.9 % — ABNORMAL HIGH (ref 11.5–15.5)
WBC: 10.3 10*3/uL (ref 4.0–10.5)
nRBC: 0 % (ref 0.0–0.2)

## 2021-11-28 LAB — BASIC METABOLIC PANEL
Anion gap: 11 (ref 5–15)
BUN: 14 mg/dL (ref 8–23)
CO2: 24 mmol/L (ref 22–32)
Calcium: 9.6 mg/dL (ref 8.9–10.3)
Chloride: 103 mmol/L (ref 98–111)
Creatinine, Ser: 0.48 mg/dL (ref 0.44–1.00)
GFR, Estimated: >60 mL/min (ref >60)
Glucose, Bld: 111 mg/dL — ABNORMAL HIGH (ref 70–99)
Potassium: 4.1 mmol/L (ref 3.5–5.1)
Sodium: 138 mmol/L (ref 135–145)

## 2021-11-28 MED ORDER — IOHEXOL 350 MG/ML SOLN
100.0000 mL | Freq: Once | INTRAVENOUS | Status: AC | PRN
  Administered 2021-11-28: 100 mL via INTRAVENOUS

## 2021-11-28 NOTE — Progress Notes (Signed)
Physical Therapy Session Note  Patient Details  Name: Darlene Griffith MRN: [id number] Date of Birth: 03-18-1951  Today's Date: 11/28/2021 PT Individual Time: 4098-1191 PT Individual Time Calculation (min): 10 min  Today's Date: 11/28/2021 PT Missed Time: 50 Minutes Missed Time Reason: Pain;Patient unwilling to participate  Short Term Goals: Week 1:  PT Short Term Goal 1 (Week 1): STG=LTG due to ELOS  Skilled Therapeutic Interventions/Progress Updates:   Received pt slouched in bed reporting "not going good". Pt reported pain 7/10 in chest and along side of abdomen but did state that new medication is helping with pain overall. Encouraged OOB mobility to reposition, however pt refused. Reminded pt of refusal policy in CIR and importance of participating in therapy, however pt adamant that she was not getting OOB. Reached out to care team to adjust schedule to 15/7 due to continued decreased participation. Concluded session with pt slouched down in bed, needs within reach, and bed alarm on. 50 minutes missed of skilled physical therapy due to pain and refusal to participate.   Therapy Documentation Precautions:  Precautions Precautions: Fall, Other (comment) Precaution Comments: Keep SBP <160 Restrictions Weight Bearing Restrictions: No  Therapy/Group: Individual Therapy Alfonse Alpers PT, DPT   11/28/2021, 7:23 AM

## 2021-11-28 NOTE — Progress Notes (Signed)
PROGRESS NOTE   Subjective/Complaints: Continues to complain of chest pain. Nitroglycerin is the only thing that helps.  Appreciate Dr. Mora Appl evaluation and plan for CTA today Patient has no SNF benefits   ROS:  Pt denies SOB, abd pain, N/V/(+) C/D, and vision changes, +postoperative pain, +chest pain   Objective:   No results found. Recent Labs    11/28/21 0511  WBC 10.3  HGB 10.8*  HCT 33.2*  PLT 188    Recent Labs    11/28/21 0511  NA 138  K 4.1  CL 103  CO2 24  GLUCOSE 111*  BUN 14  CREATININE 0.48  CALCIUM 9.6    No intake or output data in the 24 hours ending 11/28/21 1052       Physical Exam: Vital Signs Blood pressure 139/67, pulse 86, temperature 98.5 F (36.9 C), temperature source Oral, resp. rate 18, height 5' 0.98" (1.549 m), weight 68.6 kg, SpO2 100 %. Gen: no distress, normal appearing HEENT: oral mucosa pink and moist, NCAT Cardio: Reg rate Chest: normal effort, normal rate of breathing GI: soft, NT except over lower abd from bruising/incisions; somewhat distended; hypoactive BS   Psychiatric: appropriate Neurological: alert- poor memory  Musculoskeletal:        General: No swelling or deformity.     Cervical back: Neck supple. No tenderness.     Comments: 4+/5 in Biceps, triceps, WE, grip and FA B/L  4+/5 in HF/KE/DF and PF B/L- Poor effort vs weakness- hard to determine  Skin:    Comments: L abdominal incision from L of umbilicus to LLQ- staples intact- no drainage; a little puffy L groin /inguinal incision- from Mid inguinal to pubis- slightly moist- staples intact- no drainage seen- no erythema on either.   staples look good/incision looks good Neurological:     Mental Status: She is alert.     Comments: Pleasantly confused; vague, tangential- Ox1-2  Psychiatric:        Mood and Affect: Mood normal.        Behavior: Behavior normal.    Assessment/Plan: 1.  Functional deficits which require 3+ hours per day of interdisciplinary therapy in a comprehensive inpatient rehab setting. Physiatrist is providing close team supervision and 24 hour management of active medical problems listed below. Physiatrist and rehab team continue to assess barriers to discharge/monitor patient progress toward functional and medical goals  Care Tool:  Bathing    Body parts bathed by patient: Right arm, Left arm, Chest, Abdomen, Front perineal area, Buttocks, Face   Body parts bathed by helper: Right lower leg, Left lower leg, Front perineal area, Buttocks     Bathing assist Assist Level: Contact Guard/Touching assist     Upper Body Dressing/Undressing Upper body dressing   What is the patient wearing?: Pull over shirt, Bra    Upper body assist Assist Level: Minimal Assistance - Patient > 75%    Lower Body Dressing/Undressing Lower body dressing      What is the patient wearing?: Incontinence brief, Pants     Lower body assist Assist for lower body dressing: Maximal Assistance - Patient 25 - 49%     Toileting Toileting  Toileting assist Assist for toileting: Minimal Assistance - Patient > 75%     Transfers Chair/bed transfer  Transfers assist  Chair/bed transfer activity did not occur: Refused  Chair/bed transfer assist level: Contact Guard/Touching assist Chair/bed transfer assistive device: Programmer, multimedia   Ambulation assist   Ambulation activity did not occur: Refused  Assist level: Contact Guard/Touching assist Assistive device: Walker-rolling Max distance: 66ft   Walk 10 feet activity   Assist  Walk 10 feet activity did not occur: Refused  Assist level: Minimal Assistance - Patient > 75% Assistive device: Walker-rolling   Walk 50 feet activity   Assist Walk 50 feet with 2 turns activity did not occur: Refused  Assist level: Minimal Assistance - Patient > 75% Assistive device: Walker-rolling     Walk 150 feet activity   Assist Walk 150 feet activity did not occur: Refused  Assist level: Minimal Assistance - Patient > 75% Assistive device: Walker-rolling    Walk 10 feet on uneven surface  activity   Assist Walk 10 feet on uneven surfaces activity did not occur: Refused         Wheelchair     Assist Is the patient using a wheelchair?: Yes Type of Wheelchair: Manual Wheelchair activity did not occur: Refused         Wheelchair 50 feet with 2 turns activity    Assist    Wheelchair 50 feet with 2 turns activity did not occur: Refused       Wheelchair 150 feet activity     Assist  Wheelchair 150 feet activity did not occur: Refused       Blood pressure 139/67, pulse 86, temperature 98.5 F (36.9 C), temperature source Oral, resp. rate 18, height 5' 0.98" (1.549 m), weight 68.6 kg, SpO2 100 %.  Medical Problem List and Plan: 1. Functional deficits/debility secondary to aortic dissection -pt was very clear she panics when told "serious things"-              -patient may  shower- if covers incisions             -ELOS/Goals: 12-14 days- supervision  HFU scheduled.   Continue CIR- PT/OT and SLP  Refusing therapy- change to 15/7 2.  Antithrombotics: -DVT/anticoagulation:  Pharmaceutical: Heparin [place] q 8 hours 2/6- will change to Lovenox since renal function OK             -antiplatelet therapy: n/a 3. Chest pain: Tylenol, Neurontin, Robaxin, oxycodone. CTA today.              -family said don't want her to have robaxin/gabapentin- will change to skelaxin and d/c gabapentin  2/6- will schedule oxycodone at 7:30 am daily to help with therapy participation.   2/7: add nucynta 67m BID 4. Mood: LCSW to evaluate and provide emotional support             -antipsychotic agents: n/a 5. Neuropsych: This patient is capable of making decisions on her own behalf. 6. Skin/Wound Care: Routine skin care checks             -- Monitor LLQ, left groin  incision 7. Fluids/Electrolytes/Nutrition: Routine ins and outs and follow-up chemistries             -- Heart healthy diet, thin liquids             --Glucose has been in normal range; DC SSI and follow-up BMET 8. Blood loss anemia: Monitor CBC 9. Hypertension: continue Coreg,  Norvasc, Cozaar. Increase amlodipine to 10mg  10. Hyperlipidemia: continue Lipitor 11: Tobacco use: Provide cessation counseling             --Nicotine patch ?             --Continue pulmonary toilet 12: COPD: Continue Yupelri nebulizer daily 13: GERD: continue Protonix 14: Hyperthyroidismmulti-nodular goiter:  --Dr. Loanne Drilling, endocrinologist>>not on methimazole currently --TSH = 0.022 1/20, normal T3, T4 this admission 15: History of pulmonary nodules: stable in size of 5 mm nodule on 1/18 as compared to 2016, outpatient follow-up.  16. Constipation: add daily milk of mag prn 17. Feels cold- will order kpad for feeling cold/pain 18. Abdominal pain: resolved with treatment of constipation 19. Flank pain: placed nursing order to ice 15 minutes three times per day 20. Leukocytosis: trending downward, repeat Thursday  2/6- Last WBC 10.7- is better- will recheck per primary team- had very low temp of 99.2- but hasn't recurred- could be blankets- piled on bed? 21. Chest pain: nitroglycerin ordered. 22. EKG shows premature ventricular complexes, outpatient follow-up with cardiology.  23. Incisional pain: better controlled- d/c nucynta since not helping 24. Cognitive impairment: daughter would like patient off all pain medications that can impair cognition- discussed plan to wean off  >50 minutes spent in discussion with patient and daughter about her chest pain, negative troponin, stable ECG, plan for CTA today, that I will call daughter tomorrow to discuss results/discharge planning, discussing whether she has NF benefits with SW and she has none    LOS: 7 days A FACE TO FACE EVALUATION WAS PERFORMED  Martha Clan P  Tab Rylee 11/28/2021, 10:52 AM

## 2021-11-28 NOTE — Progress Notes (Signed)
CT angiogram personally reviewed in detail. No vascular cause seen for her persistent pain. She has had good remodeling of her aorta after TEVAR with thoracic branch device.  Her left iliac-femoral bypass is widely patent. I think the "stenosis" seen on the imaging is size mismatch between the 65mm graft and the smaller common femoral artery.  I see no evidence of deep space infection or abdominal wall problem after retroperitoneal incision.

## 2021-11-28 NOTE — Progress Notes (Signed)
Occupational Therapy Note  Patient Details  Name: Darlene Griffith MRN: [id number] Date of Birth: 01-19-1951  Today's Date: 11/28/2021 OT Missed Time: 46 Minutes Missed Time Reason: Patient unwilling/refused to participate without medical reason;Other (comment) (pt didn't feel like she could tolerate group session)  Pt declined group session as pt felt like she had done too much already in individual OT session and declined group session. Will f/u as time allows for OT session.      Corinne Ports [facility] 11/28/2021, 4:04 PM

## 2021-11-28 NOTE — Progress Notes (Addendum)
Progress Note    11/28/2021 7:51 AM * No surgery found *  Subjective:  persistent back pain but believes this is related to spending too much time in the hopsital bed   Vitals:   11/27/21 2109 11/28/21 0415  BP: (!) 134/55 139/67  Pulse: 79 86  Resp: 18 18  Temp: 98.2 F (36.8 C) 98.5 F (36.9 C)  SpO2: 99% 100%   Physical Exam: Lungs:  non labored Incisions:  L arm and iliac incisions c/d/i Extremities:  symmetrical DP pulses; symmetrical radial pulses Abdomen:  soft, NT, ND Neurologic: A&O  CBC    Component Value Date/Time   WBC 10.3 11/28/2021 0511   RBC 3.43 (L) 11/28/2021 0511   HGB 10.8 (L) 11/28/2021 0511   HCT 33.2 (L) 11/28/2021 0511   PLT 188 11/28/2021 0511   MCV 96.8 11/28/2021 0511   MCH 31.5 11/28/2021 0511   MCHC 32.5 11/28/2021 0511   RDW 15.9 (H) 11/28/2021 0511   LYMPHSABS 2.0 11/22/2021 0639   MONOABS 1.1 (H) 11/22/2021 0639   EOSABS 0.2 11/22/2021 0639   BASOSABS 0.0 11/22/2021 0639    BMET    Component Value Date/Time   NA 138 11/28/2021 0511   K 4.1 11/28/2021 0511   CL 103 11/28/2021 0511   CO2 24 11/28/2021 0511   GLUCOSE 111 (H) 11/28/2021 0511   BUN 14 11/28/2021 0511   CREATININE 0.48 11/28/2021 0511   CALCIUM 9.6 11/28/2021 0511   GFRNONAA >60 11/28/2021 0511   GFRAA >60 01/14/2020 1124    INR    Component Value Date/Time   INR 1.1 11/11/2021 2147    No intake or output data in the 24 hours ending 11/28/21 0751   Assessment/Plan:  71 y.o. female is s/p Thoracic branched endovascular aortic repair, left CIA and EIA stenting and left CIA to CFA bypass with 39mm ringed PTFE on 11/11/2021  Extremities well perfused with symmetrical radial and DP pulses Remove staples from L arm and LLE before discharge Repeat CTA c/a/p scheduled later this month   Dagoberto Ligas, PA-C [facility] [phone] 11/28/2021 7:51 AM  VASCULAR STAFF ADDENDUM: I have independently interviewed and examined the  patient. I agree with the above.   On my arrival to the room, the patient was having a conversation with her family member on the phone. She reports continued, severe pain throughout the lower chest and epigastric abdomen.  The pain location has changed from evaluation to evaluation.  The severity has not changed.  She would now reports nausea and poor appetite.  She is passing gas.  She cannot remember her last bowel movement.  On exam BP 139/67 (BP Location: Right Arm)    Pulse 86    Temp 98.5 F (36.9 C) (Oral)    Resp 18    Ht 5' 0.98" (1.549 m)    Wt 68.6 kg    SpO2 100%    BMI 28.59 kg/m  No acute distress noted Regular rate and rhythm Unlabored breathing Chest wall tenderness which reproduces her pain over the left parasternal chest. Soft nontender abdomen.  The abdomen is not distended. All incisions clean dry and intact. 2+ radial pulses bilaterally.  2+ posterior tibial pulses bilaterally.  The patient continues to have severe pain.  Work-up to date has been unremarkable.  She has had stable vital signs and laboratory data.  A CT angiogram done postoperatively was unremarkable.  EKG and troponin evaluation was unremarkable.  She has had persistent chest wall  tenderness which reproduces her symptoms.  I have counseled her that this is likely from intraoperative chest compressions in the past and today.    Given her persistent symptoms, I will repeat a CT angiogram of the chest, abdomen, and pelvis today.  I had a lengthy discussion with the patient's daughter yesterday.  The family is extremely frustrated by her perceived lack of progress.  They are concerned about her persistent pain.  They are concerned about the planned discharge over the weekend.  They would like a second opinion from [place].  I understand all their frustrations, and share many of them.  I support them if they want to go to [facility] for another opinion.  She may benefit from transition to skilled nursing facility given  her inability to participate with aggressive physical therapy.  [name]. Stanford Breed, MD [facility] Phone Number: [phone] 11/28/2021 8:30 AM

## 2021-11-28 NOTE — Progress Notes (Signed)
Recreational Therapy Session Note  Patient Details  Name: Darlene Griffith MRN: [id number] Date of Birth: 04-02-1951 Today's Date: 11/28/2021  Attempted eval completion today, pt requesting that LRT come back as she was talking to her work on the phone.  Attempted to come back in 5 minutes as requested, but still on the phone.  Pt also scheduled for stress management group today but refused due to fatigue.  [place] 11/28/2021, 4:13 PM

## 2021-11-28 NOTE — Progress Notes (Signed)
Physical Therapy Session Note  Patient Details  Name: Darlene Griffith MRN: [id number] Date of Birth: 1951/07/31  Today's Date: 11/28/2021 PT Individual Time: 1300-1310 PT Individual Time Calculation (min): 10 min  and Today's Date: 11/28/2021 PT Missed Time: 20 Minutes Missed Time Reason: Patient unwilling to participate  Short Term Goals: Week 1:  PT Short Term Goal 1 (Week 1): STG=LTG due to ELOS  Skilled Therapeutic Interventions/Progress Updates:    Patient received supine in bed, shaking her head at PT as therapist walked through the door. Before PT could begin to encourage patient to participate, patient stating "oh honey, I've heard it all- I hurt too much to get out of this bed." PT attempting to educate patient on importance of OOB mobility to assist with pain management. Patient reporting that her chest and stomach hurt and that she felt hungry. Patients lunch was at bedside. PT attempting to encourage patient to sit up for lunch- patient then reporting that doing so would hurt her back. PT offering non-pharmacological pain interventions, such as ice, ice, gentle stretch to assist with LBP- patient declined. Patient remaining in bed, bed alarm on, call light within reach.   Therapy Documentation Precautions:  Precautions Precautions: Fall, Other (comment) Precaution Comments: Keep SBP <160 Restrictions Weight Bearing Restrictions: No    Therapy/Group: Individual Therapy  Karoline Caldwell, PT, DPT, CBIS  11/28/2021, 10:37 AM

## 2021-11-28 NOTE — Progress Notes (Signed)
Patient ID: Darlene Griffith, female   DOB: 1951/03/11, 71 y.o.   MRN: [id number]  Patient [facility] referral sent to [facility]

## 2021-11-28 NOTE — Progress Notes (Signed)
Occupational Therapy Session Note  Patient Details  Name: Darlene Griffith MRN: [id number] Date of Birth: 06/07/1951  Today's Date: 11/28/2021 OT Individual Time: 2449-7530 OT Individual Time Calculation (min): 56 min    Short Term Goals: Week 1:  OT Short Term Goal 1 (Week 1): STG = LTG due to ELOS  Skilled Therapeutic Interventions/Progress Updates:  Pt greeted supine in bed reporting increased pain, but with increased encouragement and time pt  agreeable to OT intervention. Session focus on BADL reeducation, functional mobility, increasing overall activity tolerance and decreasing overall caregiver burden. Pt noted to be completely soiled with linens saturated. Pt agreeable to transfer supine>sit with increased time and effort and CGA, pt able to drink a couple sips of ensure from EOB. Pt stood with CGA with no AD while OTA removed sheets, pt reports need to void bowels during sit<>stand with pt able to pivot to [facility] with CGA and no AD. -BM overall MIN A for toileting with pt needing assist to manage dirty brief.  Pt able to completed bathing from [facility] with overall MIN A. Pt donned new brief with MOD A. Pt declined need for new gown. Pt pivoted back to EOB with CGA. This OTA wrote in pts memory book and discussed goal of trying to do more with OT tomorrow. pt left supine in bed with bed alarm activated and all needs within reach.                      Therapy Documentation Precautions:  Precautions Precautions: Fall, Other (comment) Precaution Comments: Keep SBP <160 Restrictions Weight Bearing Restrictions: No  Pain: 10/10 pain in stomach, rest breaks, visualization, repositioning and distraction utilized as pain mgmt strategies.    Therapy/Group: Individual Therapy  Precious Haws 11/28/2021, 3:57 PM

## 2021-11-28 NOTE — Progress Notes (Signed)
Physical Therapy Note  Patient Details  Name: Darlene Griffith MRN: [id number] Date of Birth: [birth year] Today's Date: 11/28/2021    Pt's plan of care adjusted to 15/7 after speaking with care team and discussed with MD as pt currently unable to tolerate current therapy schedule with OT, PT, and SLP.    [place] PT, DPT   11/28/2021, 10:55 AM

## 2021-11-28 NOTE — Progress Notes (Signed)
Occupational Therapy Session Note  Patient Details  Name: Darlene Griffith MRN: [id number] Date of Birth: July 22, 1951  Today's Date: 11/29/2021 OT Individual Time: 6967-8938 OT Individual Time Calculation (min): 30 min  15 minutes missed   Short Term Goals: Week 1:  OT Short Term Goal 1 (Week 1): STG = LTG due to ELOS  Skilled Therapeutic Interventions/Progress Updates:    Pt greeted in bed, MD having a phone meeting with daughter. Per MD, medical team is actively working on better controlling pts pain. 10 minutes missed initially due to meeting. Afterwards, pt reported having too much pain for OOB mobility. Agreeable to OT education regarding holistic pain mgt interventions. OT provided pt with lavender aromatherapy and guided her through diaphragmatic breathing exercises. Education provided on 3 visualization techniques to incorporate during breathing, pt practicing visualization during tx. Discussed effects of parasympathetic NS activation in regards to pain perception, pt appreciative of education and appearing receptive. OT assisted her into sidelying position with pillow between knees for comfort/pain relief. She remained in bed at close of session, all needs within reach and bed alarm set.   Therapy Documentation Precautions:  Precautions Precautions: Fall, Other (comment) Precaution Comments: Keep SBP <160 Restrictions Weight Bearing Restrictions: No General: General OT Amount of Missed Time: 15 Minutes ADL: ADL Eating: Set up Where Assessed-Eating: Bed level Grooming: Setup Where Assessed-Grooming: Bed level Upper Body Bathing: Supervision/safety Where Assessed-Upper Body Bathing: Edge of bed Lower Body Bathing: Moderate assistance Where Assessed-Lower Body Bathing: Edge of bed Upper Body Dressing: Supervision/safety Where Assessed-Upper Body Dressing: Edge of bed Lower Body Dressing: Maximal assistance Where Assessed-Lower Body Dressing: Edge of bed Toileting: Maximal  assistance Where Assessed-Toileting: Bedside Commode Toilet Transfer: Minimal assistance Toilet Transfer Method: Stand pivot (RW) Science writer: Bedside commode Tub/Shower Transfer: Unable to assess Social research officer, government: Unable to assess   Therapy/Group: Individual Therapy  Deasha Clendenin A Marylon Verno 11/29/2021, 12:34 PM

## 2021-11-29 MED ORDER — NITROGLYCERIN 0.1 MG/HR TD PT24
0.1000 mg | MEDICATED_PATCH | Freq: Every day | TRANSDERMAL | Status: DC
  Administered 2021-11-29 – 2021-12-02 (×4): 0.1 mg via TRANSDERMAL
  Filled 2021-11-29 (×4): qty 1

## 2021-11-29 NOTE — Progress Notes (Addendum)
PROGRESS NOTE   Subjective/Complaints: Discussed CTA results with patient and and her daughter. Shows stenosis- nitroglycerin is the only thing that she notes to help with her pain- discussed may be to decreased blood flow/intermittent ischemia- will try nitrodur patch.    ROS:  Pt denies SOB, abd pain, N/V/(+) C/D, and vision changes, +postoperative pain, +chest pain, +right flank pain   Objective:   CT Angio Chest/Abd/Pel for Dissection W and/or W/WO  Result Date: 11/28/2021 CLINICAL DATA:  Follow-up aortic dissection treated with a thoracic aortic stent graft. EXAM: CT ANGIOGRAPHY CHEST, ABDOMEN AND PELVIS TECHNIQUE: Non-contrast CT of the chest was initially obtained. Multidetector CT imaging through the chest, abdomen and pelvis was performed using the standard protocol during bolus administration of intravenous contrast. Multiplanar reconstructed images and MIPs were obtained and reviewed to evaluate the vascular anatomy. RADIATION DOSE REDUCTION: This exam was performed according to the departmental dose-optimization program which includes automated exposure control, adjustment of the mA and/or kV according to patient size and/or use of iterative reconstruction technique. CONTRAST:  162mL OMNIPAQUE IOHEXOL 350 MG/ML SOLN COMPARISON:  11/13/2021 FINDINGS: CTA CHEST FINDINGS Cardiovascular: The thoracic aortic stent graft extends from the origins of the innominate and left common carotid arteries, into the proximal left subclavian artery and through the arch and descending thoracic aorta to the level of the upper abdomen. All of these are normally opacified. The compressed false lumen is not thrombosed throughout the chest. Normal sized heart. Mediastinum/Nodes: Again demonstrated is diffuse enlargement and heterogeneity of the thyroid gland containing multiple nodules and with a large substernal component. This continues to deviate the  trachea to the right without significant tracheal compression. There is also deviation of the esophagus to the right with compression of the esophagus. No enlarged lymph nodes. Lungs/Pleura: The previously demonstrated right upper lobe ground-glass opacity has resolved. Small left pleural effusion, decreased in size. Resolution of the previously demonstrated small right pleural effusion. Mild bilateral dependent atelectasis, left greater than right. Musculoskeletal: Thoracic spine degenerative changes and lower cervical spine interbody hardware fusion. Review of the MIP images confirms the above findings. CTA ABDOMEN AND PELVIS FINDINGS VASCULAR Aorta: The false lumen of the aortic dissection is thrombosed inferior to the inferior extent of the aortic stent graft. This is partially opacified above the renal arteries and completely opacified at the level of the right renal artery and extending inferiorly. Celiac: Normally opacified, arising from the true lumen. SMA: Normally opacified, arising from the true lumen. Renals: The right renal artery is normally opacified, arising from the false lumen. The left renal artery is normally opacified, rising from the true lumen. IMA: Normally opacified, rising from the false lumen. Inflow: The dissection flap continues to extend in to the right common iliac artery and proximal right external iliac artery with opacification of the true and false lumens. The internal iliac artery on the right arises from the true lumen and the external iliac artery arises from both the true and false lumens. The left common iliac artery is opacified from the true lumen. The origin of the left internal iliac artery is re-opacified with a high-grade stenosis of approximately 90%. Stable normal opacification of the left  internal iliac artery graft. The right common femoral, superficial femoral and deep femoral arteries have normal appearances and are normally opacified. A graft is again demonstrated  extending from the left common iliac artery to the left common femoral artery with approximately 60% stenosis prior to the origins of the left femoral and deep femoral arteries which are normally opacified. Veins: No obvious venous abnormality within the limitations of this arterial phase study. Review of the MIP images confirms the above findings. NON-VASCULAR Hepatobiliary: No significant change in multiple small liver cysts. Unremarkable gallbladder. Pancreas: Unremarkable. No pancreatic ductal dilatation or surrounding inflammatory changes. Spleen: Normal in size without focal abnormality. Adrenals/Urinary Tract: Normal appearing adrenal glands. Small bilateral renal cysts. Unremarkable urinary bladder and ureters. Stomach/Bowel: Sigmoid colon diverticulosis without evidence of diverticulitis. Unremarkable stomach, small bowel and appendix. Lymphatic: No enlarged lymph nodes. Reproductive: Uterus and bilateral adnexa are unremarkable. Other: Skin clips in normal postoperative subcutaneous edema in the anterior abdominal wall at the level of the upper pelvis. Small amount of subcutaneous air in the right lower anterior abdominal wall compatible with recent injections. Musculoskeletal: No acute bony abnormality. Review of the MIP images confirms the above findings. IMPRESSION: 1. Thoracic aortic endovascular stent graft is normally opacified as are the great vessels. 2. Thrombosis of the false lumen of the thoracic portion of the aortic dissection to the level of the upper abdomen, above the renal arteries and inferior to the inferior extent of the stent graft. 3. The left common iliac to femoral graft remains patent. 4. 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 5. Approximately 60% stenosis of the left common femoral artery inferior to the inferior extent of the left common iliac to common femoral artery graft. 6. Resolved ground-glass opacity in the  right upper lobe. 7. Small left pleural effusion, decreased in size and resolved right pleural effusion with improved bilateral dependent atelectasis. 8. Sigmoid diverticulosis. 9. Stable thyroid multinodular goiter with a large substernal extension with associated mass effect as described above. Electronically Signed   By: Claudie Revering M.D.   On: 11/28/2021 13:15   Recent Labs    11/28/21 0511  WBC 10.3  HGB 10.8*  HCT 33.2*  PLT 188    Recent Labs    11/28/21 0511  NA 138  K 4.1  CL 103  CO2 24  GLUCOSE 111*  BUN 14  CREATININE 0.48  CALCIUM 9.6     Intake/Output Summary (Last 24 hours) at 11/29/2021 1340 Last data filed at 11/29/2021 0533 Gross per 24 hour  Intake 180 ml  Output --  Net 180 ml         Physical Exam: Vital Signs Blood pressure (!) 136/55, pulse 84, temperature 98.5 F (36.9 C), temperature source Oral, resp. rate 18, height 5' 0.98" (1.549 m), weight 68.6 kg, SpO2 99 %. Gen: no distress, normal appearing HEENT: oral mucosa pink and moist, NCAT Cardio: Reg rate Chest: normal effort, normal rate of breathing GI: soft, NT except over lower abd from bruising/incisions; somewhat distended; hypoactive BS. Tender over right flank   Psychiatric: appropriate Neurological: alert- poor memory  Musculoskeletal:        General: No swelling or deformity.     Cervical back: Neck supple. No tenderness.     Comments: 4+/5 in Biceps, triceps, WE, grip and FA B/L  4+/5 in HF/KE/DF and PF B/L- Poor effort vs weakness- hard to determine  Skin:    Comments: L abdominal  incision from L of umbilicus to LLQ- staples intact- no drainage; a little puffy L groin /inguinal incision- from Mid inguinal to pubis- slightly moist- staples intact- no drainage seen- no erythema on either.   staples look good/incision looks good Neurological:     Mental Status: She is alert.     Comments: Pleasantly confused; vague, tangential- Ox1-2  Psychiatric:        Mood and Affect:  Mood normal.        Behavior: Behavior normal.    Assessment/Plan: 1. Functional deficits which require 3+ hours per day of interdisciplinary therapy in a comprehensive inpatient rehab setting. Physiatrist is providing close team supervision and 24 hour management of active medical problems listed below. Physiatrist and rehab team continue to assess barriers to discharge/monitor patient progress toward functional and medical goals  Care Tool:  Bathing    Body parts bathed by patient: Right arm, Left arm, Chest, Abdomen, Front perineal area, Buttocks, Face, Right upper leg, Left upper leg   Body parts bathed by helper: Right lower leg, Left lower leg, Front perineal area, Buttocks Body parts n/a: Right lower leg, Left lower leg   Bathing assist Assist Level: Minimal Assistance - Patient > 75%     Upper Body Dressing/Undressing Upper body dressing   What is the patient wearing?: Pull over shirt, Bra    Upper body assist Assist Level: Minimal Assistance - Patient > 75%    Lower Body Dressing/Undressing Lower body dressing      What is the patient wearing?: Incontinence brief     Lower body assist Assist for lower body dressing: Moderate Assistance - Patient 50 - 74%     Toileting Toileting    Toileting assist Assist for toileting: Minimal Assistance - Patient > 75%     Transfers Chair/bed transfer  Transfers assist  Chair/bed transfer activity did not occur: Refused  Chair/bed transfer assist level: Supervision/Verbal cueing Chair/bed transfer assistive device: Programmer, multimedia   Ambulation assist   Ambulation activity did not occur: Refused  Assist level: Contact Guard/Touching assist Assistive device: Walker-rolling Max distance: 56ft   Walk 10 feet activity   Assist  Walk 10 feet activity did not occur: Refused  Assist level: Minimal Assistance - Patient > 75% Assistive device: Walker-rolling   Walk 50 feet activity   Assist  Walk 50 feet with 2 turns activity did not occur: Refused  Assist level: Minimal Assistance - Patient > 75% Assistive device: Walker-rolling    Walk 150 feet activity   Assist Walk 150 feet activity did not occur: Refused  Assist level: Minimal Assistance - Patient > 75% Assistive device: Walker-rolling    Walk 10 feet on uneven surface  activity   Assist Walk 10 feet on uneven surfaces activity did not occur: Refused         Wheelchair     Assist Is the patient using a wheelchair?: Yes Type of Wheelchair: Manual Wheelchair activity did not occur: Refused         Wheelchair 50 feet with 2 turns activity    Assist    Wheelchair 50 feet with 2 turns activity did not occur: Refused       Wheelchair 150 feet activity     Assist  Wheelchair 150 feet activity did not occur: Refused       Blood pressure (!) 136/55, pulse 84, temperature 98.5 F (36.9 C), temperature source Oral, resp. rate 18, height 5' 0.98" (1.549 m), weight 68.6 kg, SpO2  99 %.  Medical Problem List and Plan: 1. Functional deficits/debility secondary to aortic dissection -pt was very clear she panics when told "serious things"-              -patient may  shower- if covers incisions             -ELOS/Goals: 12-14 days- supervision  HFU scheduled.   Continue CIR- PT/OT and SLP  Refusing therapy- change to QD 2.  Antithrombotics: -DVT/anticoagulation:  Pharmaceutical: Heparin [place] q 8 hours 2/6- will change to Lovenox since renal function OK             -antiplatelet therapy: n/a 3. Chest pain: Tylenol, Neurontin, Robaxin, oxycodone. CTA today.              -family said don't want her to have robaxin/gabapentin- will change to skelaxin and d/c gabapentin  2/6- will schedule oxycodone at 7:30 am daily to help with therapy participation.   D/c nucynta as per family request to minimize sedating medications 4. Mood: LCSW to evaluate and provide emotional support              -antipsychotic agents: n/a 5. Neuropsych: This patient is capable of making decisions on her own behalf. 6. Skin/Wound Care: Routine skin care checks             -- Monitor LLQ, left groin incision 7. Fluids/Electrolytes/Nutrition: Routine ins and outs and follow-up chemistries             -- Heart healthy diet, thin liquids             --Glucose has been in normal range; DC SSI and follow-up BMET 8. Blood loss anemia: Monitor CBC 9. Hypertension: continue Coreg, Norvasc, Cozaar. Increase amlodipine to 10mg  10. Hyperlipidemia: continue Lipitor 11: Tobacco use: Provide cessation counseling             --Nicotine patch ?             --Continue pulmonary toilet 12: COPD: Continue Yupelri nebulizer daily 13: GERD: continue Protonix 14: Hyperthyroidismmulti-nodular goiter:  --Dr. Loanne Drilling, endocrinologist>>not on methimazole currently --TSH = 0.022 1/20, normal T3, T4 this admission 15: History of pulmonary nodules: stable in size of 5 mm nodule on 1/18 as compared to 2016, outpatient follow-up.  16. Constipation: add daily milk of mag prn 17. Feels cold- will order kpad for feeling cold/pain 18. Abdominal pain: resolved with treatment of constipation 19. Flank pain: placed nursing order to ice 15 minutes three times per day 20. Leukocytosis: trending downward, repeat Thursday  2/6- Last WBC 10.7- is better- will recheck per primary team- had very low temp of 99.2- but hasn't recurred- could be blankets- piled on bed? 21. Chest pain: nitroglycerin ordered and this helped.  22.  Premature ventricular complexes, outpatient follow-up with cardiology.  23. Incisional pain: better controlled- d/c nucynta since not helping 24. Cognitive impairment: daughter would like patient off all pain medications that can impair cognition- discussed plan to wean off. Discussed that weaning off these medications will likely result in increased pain.  25. Stenosis of vessels on CTA: likely contributing to her pain  given resolution of pain with nitroglycerin patch- add nitrodur patch- can uptitrate over weekend if still with pain and if patch is well tolerated     LOS: 8 days A FACE TO FACE EVALUATION WAS PERFORMED  Indigo Chaddock P Sundiata Ferrick 11/29/2021, 1:40 PM

## 2021-11-29 NOTE — Progress Notes (Signed)
Physical Therapy Session Note  Patient Details  Name: Darlene Griffith MRN: [id number] Date of Birth: Jun 09, 1951  Today's Date: 11/29/2021 PT Individual Time: 1310-1335 PT Individual Time Calculation (min): 25 min   Today's Date: 11/29/2021 PT Missed Time: 50 Minutes Missed Time Reason: Patient unwilling to participate;Pain;Other (Comment) (arrived late to session due to general team meeting)  Short Term Goals: Week 1:  PT Short Term Goal 1 (Week 1): STG=LTG due to ELOS  Skilled Therapeutic Interventions/Progress Updates:   Arrived to session 10 minutes late due to general team meeting. Received pt slouched in bed on phone. Therapist stood at bedside until pt finished with phone conversation. Pt reported pain in chest and in between "shoulder blades" (premedicated), but did not rate pain. Pt extremely tangential, with therapist having to re-direct. Encouraged OOB mobility to reposition and pt stated "I don't want to, but I will". With maximal encouragement and significantly increased time, pt transferred semi-reclined<>sitting EOB with HOB elevated and use of bedrails with supervision. Sat EOB for ~1 minute while dizziness resolved then transferred bed<>recliner with RW and supervision. Encouraged ambulation, however pt declined stating "I did what you wanted and got up, that's it". Placed k-pad and pillows for comfort and set pt up to continue eating lunch, as pt unwilling to participate any further. Concluded session with pt sitting in recliner, needs within reach, and chair pad alarm on. 50 minutes missed of skilled physical therapy.   Therapy Documentation Precautions:  Precautions Precautions: Fall, Other (comment) Precaution Comments: Keep SBP <160 Restrictions Weight Bearing Restrictions: No  Therapy/Group: Individual Therapy Alfonse Alpers PT, DPT   11/29/2021, 7:35 AM

## 2021-11-29 NOTE — Progress Notes (Signed)
Patient ID: Darlene Griffith, female   DOB: 14-Oct-1951, 71 y.o.   MRN: [id number]  Patient approved by [facility] for follow up. Orders sent to agency.

## 2021-11-29 NOTE — Discharge Instructions (Addendum)
Inpatient Rehab Discharge Instructions  Darlene Griffith Discharge date and time: 12/03/2021  Activities/Precautions/ Functional Status:  Activity: no lifting, driving, or strenuous exercise for until cleared by MD  Diet: cardiac diet  Wound Care: keep wound clean and dry; keep dry gauze in left groin skin fold to wick moisture  Functional status:  ___ No restrictions     ___ Walk up steps independently ___ 24/7 supervision/assistance   ___ Walk up steps with assistance ___ Intermittent supervision/assistance  ___ Bathe/dress independently ___ Walk with walker     ___ Bathe/dress with assistance ___ Walk Independently    ___ Shower independently ___ Walk with assistance    _x__ Shower with assistance __x_ No alcohol     ___ Return to work/school ________  COMMUNITY REFERRALS UPON DISCHARGE:    Home Health:   PT     OT     ST                   [facility]ency: [facility] Phone: [phone]   Medical Equipment/Items Ordered: Vassie Moselle and Tub Producer, television/film/video                                                  Agency/Supplier: [employer]   Special Instructions:  No driving, alcohol consumption or tobacco use.   My questions have been answered and I understand these instructions. I will adhere to these goals and the provided educational materials after my discharge from the hospital.  Patient/Caregiver Signature _______________________________ Date __________  Clinician Signature _______________________________________ Date __________  Please bring this form and your medication list with you to all your follow-up doctor's appointments.

## 2021-11-29 NOTE — Progress Notes (Signed)
Occupational Therapy Session Note  Patient Details  Name: AURIELLE SLINGERLAND MRN: [id number] Date of Birth: [birth year]  Today's Date: 11/29/2021 OT Missed Time: 21 Minutes Missed Time Reason: Other (comment);Pain   Pt received laying in bed on her L side, reporting had just gotten up with nursing to use bathroom. Pt reporting increased pain this session and fatigue, stating that all she wants to do is "get pain meds, lay in this bed and go home." Educated pt on 3 refusal policy. Offered pt a chance to wash her hair, however due to short time, therapist encouraging shower tray method vs full shower this session, with pt declining to participate. Pt declined repositioning, bed level or simple self-care tasks. Therapist notified pt's nurse about pain meds, with pt left in same position, with bed alarm on and all needs in reach. Pt missed 30 mins of OT intervention due to pain/refusal.  Taliya Mcclard E Mckenzee Beem 11/29/2021, 7:29 AM

## 2021-11-29 NOTE — Progress Notes (Signed)
Patient ID: Darlene Griffith, female   DOB: 07-10-1951, 71 y.o.   MRN: [id number]  SW informed patient extended on CIR until Tuesday. Patient daughter, [name] made aware.

## 2021-11-29 NOTE — Progress Notes (Signed)
Physical Therapy Note  Patient Details  Name: Darlene Griffith MRN: [id number] Date of Birth: 11-Apr-1951 Today's Date: 11/29/2021    Pt's plan of care adjusted to QD after speaking with care team and discussed with MD as pt currently unable to tolerate current therapy schedule with OT, PT, and SLP.    Betsey Holiday Lyn Hollingshead PT, DPT   11/29/2021, 2:09 PM

## 2021-11-30 MED ORDER — DULOXETINE HCL 30 MG PO CPEP
30.0000 mg | ORAL_CAPSULE | Freq: Every day | ORAL | Status: DC
  Administered 2021-11-30 – 2021-12-02 (×3): 30 mg via ORAL
  Filled 2021-11-30 (×2): qty 1

## 2021-11-30 NOTE — Progress Notes (Signed)
Speech Language Pathology Daily Session Note  Patient Details  Name: Darlene Griffith MRN: [id number] Date of Birth: [birth year]  Today's Date: 11/30/2021 SLP Individual Time: 0930-1015 SLP Individual Time Calculation (min): 45 min  Short Term Goals: Week 1: SLP Short Term Goal 1 (Week 1): STG=LTG due to ELOS  Skilled Therapeutic Interventions:   Patient seen for skilled ST session focused on cognitive function. Initially when SLP entered room and introduced self and purpose of visit, patient shook her head. She had a N/V episode prior to SLP's arrival. As SLP was getting ready to leave and asking patient if there was anything she needed, she then started talking about needing prayer. SLP mentioned Chaplain service and patient appreciated this information but she said she needed to get her mind off of thinking that chaplains represent "death". SLP then commented on TV in her room, prompting her to start describing entire synopsis of a movie that she had watched. She was very pleasant and required only minA level verbal cues to maintain topic, but although she would often comment "Im going to keep this short", she remained verbose without adequate awareness. Patient left in bed with all needs within reach. She continues to benefit from skilled SLP intervention to maximize cognitive function prior to discharge.  Pain Pain Assessment Pain Scale: 0-10 Pain Score: 0-No pain  Therapy/Group: Individual Therapy   Sonia Baller, MA, CCC-SLP Speech Therapy

## 2021-11-30 NOTE — Progress Notes (Signed)
Patient c/o sub sternal chest pain r/t CPR on admission to hospital. Patient was given PRN order of nitroglycerin SL .4mg  for chest pain. Patient states "pain medication does not help with pain like the SL tablet".  Patients blood pressure and heart rate WDL. Patient asleep in bed with call bell within reach. Will continue with plan of care.

## 2021-11-30 NOTE — Progress Notes (Signed)
PROGRESS NOTE   Subjective/Complaints:  Pt reports pain is her biggest issue- just got Nitro patch put on this AM when I was in room.   The worst is the burning.   ROS:   Pt denies SOB, abd pain, CP, N/V/C/D, and vision changes  Objective:   No results found. Recent Labs    11/28/21 0511  WBC 10.3  HGB 10.8*  HCT 33.2*  PLT 188    Recent Labs    11/28/21 0511  NA 138  K 4.1  CL 103  CO2 24  GLUCOSE 111*  BUN 14  CREATININE 0.48  CALCIUM 9.6    No intake or output data in the 24 hours ending 11/30/21 1343        Physical Exam: Vital Signs Blood pressure 128/72, pulse 78, temperature 98.7 F (37.1 C), temperature source Oral, resp. rate 16, height 5' 0.98" (1.549 m), weight 68.6 kg, SpO2 98 %.    General: awake, alert, appropriate, NAD HENT: conjugate gaze; oropharynx moist CV: regular rate; no JVD Pulmonary: CTA B/L; no W/R/R- good air movement Neurological: Ox2- remembered me GI: soft, NT except over lower abd from bruising/incisions; somewhat distended; hypoactive BS. Tender over right flank   Psychiatric: appropriate Neurological: alert- poor memory  Musculoskeletal:        General: No swelling or deformity.     Cervical back: Neck supple. No tenderness.     Comments: 4+/5 in Biceps, triceps, WE, grip and FA B/L  4+/5 in HF/KE/DF and PF B/L- Poor effort vs weakness- hard to determine  Skin:    Comments: L abdominal incision from L of umbilicus to LLQ- staples intact- no drainage; a little puffy L groin /inguinal incision- from Mid inguinal to pubis- slightly moist- staples intact- no drainage seen- no erythema on either.   staples look good/incision looks good Neurological:     Mental Status: She is alert.     Comments: Pleasantly confused; vague, tangential- Ox1-2  Psychiatric:        Mood and Affect: Mood normal.        Behavior: Behavior normal.    Assessment/Plan: 1.  Functional deficits which require 3+ hours per day of interdisciplinary therapy in a comprehensive inpatient rehab setting. Physiatrist is providing close team supervision and 24 hour management of active medical problems listed below. Physiatrist and rehab team continue to assess barriers to discharge/monitor patient progress toward functional and medical goals  Care Tool:  Bathing    Body parts bathed by patient: Right arm, Left arm, Chest, Abdomen, Front perineal area, Buttocks, Face, Right upper leg, Left upper leg   Body parts bathed by helper: Right lower leg, Left lower leg, Front perineal area, Buttocks Body parts n/a: Right lower leg, Left lower leg   Bathing assist Assist Level: Minimal Assistance - Patient > 75%     Upper Body Dressing/Undressing Upper body dressing   What is the patient wearing?: Pull over shirt, Bra    Upper body assist Assist Level: Minimal Assistance - Patient > 75%    Lower Body Dressing/Undressing Lower body dressing      What is the patient wearing?: Incontinence brief  Lower body assist Assist for lower body dressing: Moderate Assistance - Patient 50 - 74%     Toileting Toileting    Toileting assist Assist for toileting: Minimal Assistance - Patient > 75%     Transfers Chair/bed transfer  Transfers assist  Chair/bed transfer activity did not occur: Refused  Chair/bed transfer assist level: Supervision/Verbal cueing Chair/bed transfer assistive device: Programmer, multimedia   Ambulation assist   Ambulation activity did not occur: Refused  Assist level: Contact Guard/Touching assist Assistive device: Walker-rolling Max distance: 57ft   Walk 10 feet activity   Assist  Walk 10 feet activity did not occur: Refused  Assist level: Minimal Assistance - Patient > 75% Assistive device: Walker-rolling   Walk 50 feet activity   Assist Walk 50 feet with 2 turns activity did not occur: Refused  Assist level:  Minimal Assistance - Patient > 75% Assistive device: Walker-rolling    Walk 150 feet activity   Assist Walk 150 feet activity did not occur: Refused  Assist level: Minimal Assistance - Patient > 75% Assistive device: Walker-rolling    Walk 10 feet on uneven surface  activity   Assist Walk 10 feet on uneven surfaces activity did not occur: Refused         Wheelchair     Assist Is the patient using a wheelchair?: Yes Type of Wheelchair: Manual Wheelchair activity did not occur: Refused         Wheelchair 50 feet with 2 turns activity    Assist    Wheelchair 50 feet with 2 turns activity did not occur: Refused       Wheelchair 150 feet activity     Assist  Wheelchair 150 feet activity did not occur: Refused       Blood pressure 128/72, pulse 78, temperature 98.7 F (37.1 C), temperature source Oral, resp. rate 16, height 5' 0.98" (1.549 m), weight 68.6 kg, SpO2 98 %.  Medical Problem List and Plan: 1. Functional deficits/debility secondary to aortic dissection -pt was very clear she panics when told "serious things"-              -patient may  shower- if covers incisions             -ELOS/Goals: 12-14 days- supervision  HFU scheduled.   Continue CIR- PT, OT and SLP- as tolerated  Refusing therapy- change to QD 2.  Antithrombotics: -DVT/anticoagulation:  Pharmaceutical: Heparin [place] q 8 hours 2/6- will change to Lovenox since renal function OK             -antiplatelet therapy: n/a 3. Chest pain: Tylenol, Neurontin, Robaxin, oxycodone. CTA today.              -family said don't want her to have robaxin/gabapentin- will change to skelaxin and d/c gabapentin  2/6- will schedule oxycodone at 7:30 am daily to help with therapy participation.   D/c nucynta as per family request to minimize sedating medications  2/11- will try Cymbalta 30 mg QHS for nerve pain-  4. Mood: LCSW to evaluate and provide emotional support             -antipsychotic  agents: n/a 5. Neuropsych: This patient is capable of making decisions on her own behalf. 6. Skin/Wound Care: Routine skin care checks             -- Monitor LLQ, left groin incision 7. Fluids/Electrolytes/Nutrition: Routine ins and outs and follow-up chemistries             --  Heart healthy diet, thin liquids             --Glucose has been in normal range; DC SSI and follow-up BMET 8. Blood loss anemia: Monitor CBC 9. Hypertension: continue Coreg, Norvasc, Cozaar. Increase amlodipine to 10mg   2/11- BP well controlle-d con't regimen 10. Hyperlipidemia: continue Lipitor 11: Tobacco use: Provide cessation counseling             --Nicotine patch ?             --Continue pulmonary toilet 12: COPD: Continue Yupelri nebulizer daily 13: GERD: continue Protonix 14: Hyperthyroidismmulti-nodular goiter:  --Dr. Loanne Drilling, endocrinologist>>not on methimazole currently --TSH = 0.022 1/20, normal T3, T4 this admission 15: History of pulmonary nodules: stable in size of 5 mm nodule on 1/18 as compared to 2016, outpatient follow-up.  16. Constipation: add daily milk of mag prn 17. Feels cold- will order kpad for feeling cold/pain 18. Abdominal pain: resolved with treatment of constipation 19. Flank pain: placed nursing order to ice 15 minutes three times per day 20. Leukocytosis: trending downward, repeat Thursday  2/6- Last WBC 10.7- is better- will recheck per primary team- had very low temp of 99.2- but hasn't recurred- could be blankets- piled on bed? 21. Chest pain: nitroglycerin ordered and this helped.  22.  Premature ventricular complexes, outpatient follow-up with cardiology.  23. Incisional pain: better controlled- d/c nucynta since not helping 24. Cognitive impairment: daughter would like patient off all pain medications that can impair cognition- discussed plan to wean off. Discussed that weaning off these medications will likely result in increased pain.  25. Stenosis of vessels on CTA:  likely contributing to her pain given resolution of pain with nitroglycerin patch- add nitrodur patch- can uptitrate over weekend if still with pain and if patch is well tolerated   I spent a total of 35   minutes on total care today- >50% coordination of care- due to d/w pt prolonged period about NTG patch and nursing about cymbalta.       LOS: 9 days A FACE TO FACE EVALUATION WAS PERFORMED  Rashidi Loh 11/30/2021, 1:43 PM

## 2021-12-01 NOTE — Progress Notes (Signed)
Patient resting in bed. Continues to C/O chest pain and lower back pain. PRN OXY 10 mg given for back pain and Nitro given x 1 for chest pain. Patient states that she only gets relief by taking the nitro under her tongue. Upon recheck patient continues to C/O chest and back pain. Encouraged patient to sit up in bed, change position or get  into W/C to possible alleviate some pain and discomfort. Patient declined, Heating pad applied to back for patient comfort. Incisions to groin, ABT and under left breast  CDI, no draining no S/S of infections. Call light and personal items within reach.

## 2021-12-02 ENCOUNTER — Inpatient Hospital Stay (HOSPITAL_COMMUNITY): Payer: BC Managed Care – PPO

## 2021-12-02 MED ORDER — OXYCODONE HCL 5 MG PO TABS
5.0000 mg | ORAL_TABLET | ORAL | Status: DC | PRN
  Administered 2021-12-02 – 2021-12-03 (×5): 5 mg via ORAL
  Filled 2021-12-02 (×5): qty 1

## 2021-12-02 NOTE — Progress Notes (Signed)
Patient requests PRN nitro 0.4 mg sl. Administered 2 Nitro 0.4 mg sl 5 minutes apart. Patient reports decreased pain

## 2021-12-02 NOTE — Progress Notes (Signed)
Inpatient Rehabilitation Discharge Medication Review by a Pharmacist  A complete drug regimen review was completed for this patient to identify any potential clinically significant medication issues.  High Risk Drug Classes Is patient taking? Indication by Medication  Antipsychotic Yes Compazine- N/V only for inpt  Anticoagulant Yes Lovenox - VTE prophylaxis for inpt only  Antibiotic No   Opioid Yes OxyIR- prn pain  Antiplatelet No   Hypoglycemics/insulin No   Vasoactive Medication Yes Coreg, Norvasc, Cozaar- Hypertension  Chemotherapy No   Other Yes Lipitor- HLD Protonix- GERD     Type of Medication Issue Identified Description of Issue Recommendation(s)  Drug Interaction(s) (clinically significant)     Duplicate Therapy     Allergy     No Medication Administration End Date     Incorrect Dose     Additional Drug Therapy Needed     Significant med changes from prior encounter (inform family/care partners about these prior to discharge).    Other       Clinically significant medication issues were identified that warrant physician communication and completion of prescribed/recommended actions by midnight of the next day:  No  Time spent performing this drug regimen review (minutes):  [address] PharmD, [place], AAHIVP, CPP Infectious Disease Pharmacist 12/02/2021 1:53 PM

## 2021-12-02 NOTE — Progress Notes (Signed)
Occupational Therapy Discharge Summary  Patient Details  Name: Darlene Griffith MRN: [id number] Date of Birth: [birth year]   Patient has met 8 of 11 long term goals due to improved activity tolerance, improved balance, and improved coordination.  Patient to discharge at overall Supervision level.  Patient's care partner is independent to provide consistent supervision assistance at discharge.    Reasons goals not met: tub/shower transfer not completed due to pt refusal, LBD goal not met at supervision level due to decreased participation however pt able to complete at min A level for threading BLEs into pants. Meal prep goal not met due to pt refusal with any out of room activity and declining going to the kitchen with therapist for meal prep education  Recommendation:  Patient will benefit from ongoing skilled OT services in home health setting to continue to advance functional skills in the area of BADL, iADL, and Reduce care partner burden.  Equipment: TTB  Reasons for discharge: lack of progress toward goals and discharge from hospital  Patient/family agrees with progress made and goals achieved: Yes  OT Discharge Precautions/Restrictions  Precautions Precautions: Fall Restrictions Weight Bearing Restrictions: No ADL ADL Eating: Set up Where Assessed-Eating: Chair Grooming: Modified independent Where Assessed-Grooming: Edge of bed Upper Body Bathing: Modified independent Where Assessed-Upper Body Bathing: Sitting at sink Lower Body Bathing: Supervision/safety Where Assessed-Lower Body Bathing: Sitting at sink Upper Body Dressing: Modified independent (Device) Where Assessed-Upper Body Dressing: Edge of bed Lower Body Dressing: Supervision/safety Where Assessed-Lower Body Dressing: Edge of bed Toileting: Supervision/safety Where Assessed-Toileting: Bedside Commode Toilet Transfer: Close supervision Toilet Transfer Method: Counselling psychologist: Research officer, political party: Unable to assess Social research officer, government: Unable to assess Vision Baseline Vision/History: 1 Wears glasses Patient Visual Report: No change from baseline Perception  Perception: Within Functional Limits Praxis Praxis: Intact Cognition Overall Cognitive Status: Impaired/Different from baseline Arousal/Alertness: Lethargic Orientation Level: Oriented X4 Year: 2023 Month: February Day of Week: Incorrect Memory: Impaired Memory Recall Sock: Without Cue Memory Recall Blue: Not able to recall Memory Recall Bed: With Cue Awareness: Impaired Problem Solving: Impaired Safety/Judgment: Impaired Sensation Sensation Light Touch: Appears Intact Proprioception: Appears Intact Coordination Gross Motor Movements are Fluid and Coordinated: Yes Fine Motor Movements are Fluid and Coordinated: Yes Finger Nose Finger Test: [facility] bilaterally Motor  Motor Motor: Within Functional Limits Motor - Skilled Clinical Observations: generalized weakness and deconditioning - poor motivation to participate in therapy or get OOB Mobility  Bed Mobility Bed Mobility: Rolling Right;Rolling Left;Supine to Sit;Sit to Supine Rolling Right: Supervision/verbal cueing Rolling Left: Supervision/Verbal cueing Supine to Sit: Supervision/Verbal cueing Sit to Supine: Supervision/Verbal cueing Transfers Sit to Stand: Supervision/Verbal cueing Stand to Sit: Supervision/Verbal cueing  Trunk/Postural Assessment  Cervical Assessment Cervical Assessment: Exceptions to [facility] (forward head) Thoracic Assessment Thoracic Assessment: Exceptions to [facility] (kyphosis) Lumbar Assessment Lumbar Assessment: Exceptions to [facility] (posterior pelvic tilt when sitting EOB) Postural Control Postural Control: Within Functional Limits  Balance Balance Balance Assessed: Yes Static Sitting Balance Static Sitting - Balance Support: Feet supported;No upper extremity supported Static Sitting - Level of Assistance:  6: Modified independent (Device/Increase time) Dynamic Sitting Balance Dynamic Sitting - Balance Support: Feet supported;No upper extremity supported Dynamic Sitting - Level of Assistance: 6: Modified independent (Device/Increase time) Static Standing Balance Static Standing - Balance Support: Bilateral upper extremity supported (RW) Static Standing - Level of Assistance: 5: Stand by assistance (supervision) Dynamic Standing Balance Dynamic Standing - Balance Support: Bilateral upper extremity supported (RW) Dynamic Standing - Level of Assistance:  5: Stand by assistance (supervision) Extremity/Trunk Assessment RUE Assessment RUE Assessment: Within Functional Limits General Strength Comments: WFL during self-care tasks LUE Assessment LUE Assessment: Within Functional Limits General Strength Comments: WFL during self-care tasks   Herold Salguero E Con Arganbright 12/02/2021, 7:58 AM

## 2021-12-02 NOTE — Progress Notes (Signed)
Physical Therapy Discharge Summary  Patient Details  Name: Darlene Griffith MRN: [id number] Date of Birth: September 19, 1951  Today's Date: 12/02/2021 PT Individual Time: 0801-0839 PT Individual Time Calculation (min): 38 min   Patient has met 4 of 8 long term goals due to decreased pain and ability to compensate for deficits.  Patient to discharge at an ambulatory level Supervision using RW. Patient's care partner is independent to provide the necessary physical and cognitive assistance at discharge. Pt has made limited progress while in rehab due to numerous refusal to participate in therapy. Per pt, she will have 24/7 supervision/assist from family   Reasons goals not met: Pt did not meet goals due to refusal to get OOB during majority of therapy sessions.   Recommendation:  Patient will benefit from ongoing skilled PT services in home health setting to continue to advance safe functional mobility, address ongoing impairments in generalized strengthening and endurance, gait training, dynamic standing balance/coordination, and to minimize fall risk.  Equipment: RW  Reasons for discharge: refusal of 3 consecutive treatment sessions without medical reason, lack of progress toward goals, and discharge from hospital  Patient/family agrees with progress made and goals achieved: Yes  Today's interventions: Received pt slouched in bed reporting pain in low back and stomach, rated 8/10 (premedicated). Pt also reported feeling nauseous with 2 emesis episodes, 1 last night and 1 this morning. Pt declined OOB mobility despite maximal encouragement but was agreeable to letting therapist perform sensation and manual muscle testing. Rolled to R with supervision and repositioned k-pad to relieve low back pain. Reviewed HEP and emphasized importance of movement even while in the bed. Pt requesting hot chocolate - used as motivation to encourage pt to work on exercises. Pt performed the following exercises with  supervision and verbal cues for technique: -R sidelying clamshells 2x15 -supine hip adduction pillow squeezes 2x15 -supine SLR 2x10 bilaterally - decreased ROM as a result of poor motivation Concluded session with pt supine in bed, needs within reach, and bed alarm on. Provided pt with hot chocolate as promised and fresh ice water.   PT Discharge Precautions/Restrictions Precautions Precautions: Fall Restrictions Weight Bearing Restrictions: No Pain Interference Pain Interference Pain Effect on Sleep: 4. Almost constantly Pain Interference with Therapy Activities: 4. Almost constantly Pain Interference with Day-to-Day Activities: 4. Almost constantly (pt reports the pain prevents her from eating) Cognition Overall Cognitive Status: Impaired/Different from baseline Arousal/Alertness: Awake/alert Orientation Level: Oriented X4 Memory: Impaired Awareness: Impaired Problem Solving: Impaired Safety/Judgment: Impaired Sensation Sensation Light Touch: Appears Intact Proprioception: Appears Intact Additional Comments: pt reports occasional numbness/tingling/burning in LEs Coordination Gross Motor Movements are Fluid and Coordinated: Yes Fine Motor Movements are Fluid and Coordinated: Yes Finger Nose Finger Test: WFL bilaterally, but slow Heel Shin Test: [facility] but slow Motor  Motor Motor: Within Functional Limits Motor - Skilled Clinical Observations: generalized weakness and deconditioning - poor motivation to participate in therapy or get OOB  Mobility Bed Mobility Bed Mobility: Rolling Right;Rolling Left;Supine to Sit;Sit to Supine Rolling Right: Supervision/verbal cueing Rolling Left: Supervision/Verbal cueing Supine to Sit: Supervision/Verbal cueing Sit to Supine: Supervision/Verbal cueing Transfers Transfers: Sit to Stand;Stand to Sit;Stand Pivot Transfers Sit to Stand: Supervision/Verbal cueing Stand to Sit: Supervision/Verbal cueing Stand Pivot Transfers:  Supervision/Verbal cueing Stand Pivot Transfer Details: Verbal cues for safe use of DME/AE Stand Pivot Transfer Details (indicate cue type and reason): verbal cues for hand placement on RW for safety Transfer (Assistive device): Rolling walker Locomotion  Gait Ambulation: Yes Gait Assistance: Contact Guard/Touching  assist Gait Distance (Feet): 35 Feet Assistive device: Rolling walker Gait Gait: Yes Gait Pattern: Impaired Gait Pattern: Decreased step length - left;Decreased step length - right;Decreased stride length;Poor foot clearance - left;Poor foot clearance - right;Trunk flexed;Decreased trunk rotation Gait velocity: decreased Stairs / Additional Locomotion Stairs: No (refused) Pick up small object from the floor (from standing position) activity did not occur: Human resources officer Mobility: No (refused)  Trunk/Postural Assessment  Cervical Assessment Cervical Assessment: Exceptions to [facility] (forward head) Thoracic Assessment Thoracic Assessment: Exceptions to [facility] (kyphosis) Lumbar Assessment Lumbar Assessment: Exceptions to [facility] (posterior pelvic tilt when sitting EOB) Postural Control Postural Control: Within Functional Limits  Balance Balance Balance Assessed: Yes Static Sitting Balance Static Sitting - Balance Support: Feet supported;No upper extremity supported Static Sitting - Level of Assistance: 6: Modified independent (Device/Increase time) Dynamic Sitting Balance Dynamic Sitting - Balance Support: Feet supported;No upper extremity supported Dynamic Sitting - Level of Assistance: 6: Modified independent (Device/Increase time) Static Standing Balance Static Standing - Balance Support: Bilateral upper extremity supported (RW) Static Standing - Level of Assistance: 5: Stand by assistance (supervision) Dynamic Standing Balance Dynamic Standing - Balance Support: Bilateral upper extremity supported (RW) Dynamic Standing - Level of Assistance: 5:  Stand by assistance (supervision) Extremity Assessment  RLE Assessment RLE Assessment: Exceptions to [facility] RLE Strength RLE Overall Strength Comments: tested from supine position Right Hip Flexion: 4-/5 Right Hip ABduction: 4-/5 Right Knee Extension: 4-/5 Right Ankle Dorsiflexion: 4/5 Right Ankle Plantar Flexion: 4/5 LLE Assessment LLE Assessment: Exceptions to [facility] General Strength Comments: tested from supine position LLE Strength Left Hip Flexion: 4-/5 Left Hip ABduction: 4-/5 Left Knee Extension: 4-/5 Left Ankle Dorsiflexion: 4/5 Left Ankle Plantar Flexion: 4/5  Jakeria Caissie M Lyn Hollingshead PT, DPT  12/02/2021, 7:15 AM

## 2021-12-02 NOTE — Progress Notes (Signed)
Occupational Therapy Session Note  Patient Details  Name: Darlene Griffith MRN: [id number] Date of Birth: 05-Mar-1951  Today's Date: 12/02/2021 OT Individual Time: 1300-1340 OT Individual Time Calculation (min): 40 min    Short Term Goals: Week 1:  OT Short Term Goal 1 (Week 1): STG = LTG due to ELOS  Skilled Therapeutic Interventions/Progress Updates:  Skilled OT intervention completed with focus on family education regarding home management, pt's CLOF, assist level recommended, DME and explanation of POC, OT goals, and d/c planning. Pt received supine in bed, with pt's DIL present. Pt c/o pain, with therapist checking in with nurse on status of meds. MD and nurse in room discussing pt's pain med regimen, with therapist participating in pt's c/o pain and frequency, as some disconnect between staff occurs when pt says she has requested meds but with impaired memory, is inaccurate on timing of meds.   Pt able to sit up in bed with back unsupported for facial hygiene at set up level. Pt declined OOB or EOB activity. Pt's DIL with several questions about recommendations for home, as she reports CSW has not contacted family. Pt's DIL requested that OT call and talk to family about all d/c plans, with therapist educating family on CSW role, however did provide DIL direct number to call CSW, with message also sent to [place] about meeting with the family prior to d/c if possible.   Education provided about the following:  -TTB, and recommendations for pt to wait for [place] to assist in practicing transfers for showers due to pt refusal to try with OT here in rehab -CLOF, at the constant supervision level for toilet transfers and min A for LB dressing -How to maintain functional endurance, encouraging family to get her OOB -purpose of [place] -Strategies to increase arousal and decrease pain -Cognitive strategies to use at home  Pt and DIL receptive to education provided, with therapist assisting to  reposition pt in bed, with pt left supine in bed with bed alarm on, RN providing meds, DIL in room and all needs met at end of session.    Therapy Documentation Precautions:  Precautions Precautions: Fall Precaution Comments: Keep SBP <160 Restrictions Weight Bearing Restrictions: No  Pain: Unrated pain in chest- MD and RN in room and aware   Therapy/Group: Individual Therapy  [name] [name] 12/02/2021, 1:08 PM

## 2021-12-02 NOTE — Progress Notes (Signed)
PROGRESS NOTE   Subjective/Complaints: Patient states she only gets relief from sublingual nitroglycerin- can provide script for this at home as well and d/c nitro patch. Decrease oxycodone to 5mg  and will send this home on d/c  ROS:   Pt denies SOB, abd pain, N/V/C/D, and vision changes, +chest pain, +incisional site pain  Objective:   No results found. No results for input(s): WBC, HGB, HCT, PLT in the last 72 hours.   No results for input(s): NA, K, CL, CO2, GLUCOSE, BUN, CREATININE, CALCIUM in the last 72 hours.   No intake or output data in the 24 hours ending 12/02/21 1132        Physical Exam: Vital Signs Blood pressure (!) 120/57, pulse 87, temperature 98.8 F (37.1 C), temperature source Oral, resp. rate 16, height 5' 0.98" (1.549 m), weight 68.6 kg, SpO2 95 %. Gen: no distress, normal appearing HEENT: oral mucosa pink and moist, NCAT Cardio: Reg rate Chest: normal effort, normal rate of breathing  GI: soft, NT except over lower abd from bruising/incisions; somewhat distended; hypoactive BS. Tender over right flank   Psychiatric: appropriate Neurological: alert- poor memory  Musculoskeletal:        General: No swelling or deformity.     Cervical back: Neck supple. No tenderness.     Comments: 4+/5 in Biceps, triceps, WE, grip and FA B/L  4+/5 in HF/KE/DF and PF B/L- Poor effort vs weakness- hard to determine  Skin: Incisions c/d/i    Comments: L abdominal incision from L of umbilicus to LLQ- staples intact- no drainage; a little puffy L groin /inguinal incision- from Mid inguinal to pubis- slightly moist- staples intact- no drainage seen- no erythema on either.   staples look good/incision looks good Neurological:     Mental Status: She is alert.     Comments: Pleasantly confused; vague, tangential- Ox1-2  Psychiatric:        Mood and Affect: Mood normal.        Behavior: Behavior normal.     Assessment/Plan: 1. Functional deficits which require 3+ hours per day of interdisciplinary therapy in a comprehensive inpatient rehab setting. Physiatrist is providing close team supervision and 24 hour management of active medical problems listed below. Physiatrist and rehab team continue to assess barriers to discharge/monitor patient progress toward functional and medical goals  Care Tool:  Bathing    Body parts bathed by patient: Right arm, Left arm, Chest, Abdomen, Front perineal area, Buttocks, Face, Right upper leg, Left upper leg   Body parts bathed by helper: Right lower leg, Left lower leg, Front perineal area, Buttocks Body parts n/a: Right lower leg, Left lower leg   Bathing assist Assist Level: Minimal Assistance - Patient > 75%     Upper Body Dressing/Undressing Upper body dressing   What is the patient wearing?: Pull over shirt, Bra    Upper body assist Assist Level: Minimal Assistance - Patient > 75%    Lower Body Dressing/Undressing Lower body dressing      What is the patient wearing?: Incontinence brief     Lower body assist Assist for lower body dressing: Moderate Assistance - Patient 50 - 74%  Toileting Toileting    Toileting assist Assist for toileting: Minimal Assistance - Patient > 75%     Transfers Chair/bed transfer  Transfers assist  Chair/bed transfer activity did not occur: Refused  Chair/bed transfer assist level: Supervision/Verbal cueing Chair/bed transfer assistive device: Museum/gallery exhibitions officer assist   Ambulation activity did not occur: Refused  Assist level: Contact Guard/Touching assist Assistive device: Walker-rolling Max distance: 23ft   Walk 10 feet activity   Assist  Walk 10 feet activity did not occur: Refused  Assist level: Contact Guard/Touching assist Assistive device: Walker-rolling   Walk 50 feet activity   Assist Walk 50 feet with 2 turns activity did not occur:  Refused  Assist level: Minimal Assistance - Patient > 75% Assistive device: Walker-rolling    Walk 150 feet activity   Assist Walk 150 feet activity did not occur: Refused  Assist level: Minimal Assistance - Patient > 75% Assistive device: Walker-rolling    Walk 10 feet on uneven surface  activity   Assist Walk 10 feet on uneven surfaces activity did not occur: Refused         Wheelchair     Assist Is the patient using a wheelchair?: Yes Type of Wheelchair: Manual Wheelchair activity did not occur: Refused         Wheelchair 50 feet with 2 turns activity    Assist    Wheelchair 50 feet with 2 turns activity did not occur: Refused       Wheelchair 150 feet activity     Assist  Wheelchair 150 feet activity did not occur: Refused       Blood pressure (!) 120/57, pulse 87, temperature 98.8 F (37.1 C), temperature source Oral, resp. rate 16, height 5' 0.98" (1.549 m), weight 68.6 kg, SpO2 95 %.  Medical Problem List and Plan: 1. Functional deficits/debility secondary to aortic dissection -pt was very clear she panics when told "serious things"-              -patient may  shower- if covers incisions             -ELOS/Goals: 12-14 days- supervision  HFU scheduled.   Continue CIR- PT, OT and SLP- as tolerated  Refusing therapy- change to QD  Plan for d/c tomorrow.  2.  Antithrombotics: -DVT/anticoagulation:  Pharmaceutical: Heparin [place] q 8 hours 2/6- will change to Lovenox since renal function OK             -antiplatelet therapy: n/a 3. Chest pain: Tylenol, Neurontin, Robaxin, oxycodone. CTA today.              -family said don't want her to have robaxin/gabapentin- will change to skelaxin and d/c gabapentin  2/6- will schedule oxycodone at 7:30 am daily to help with therapy participation.   D/c nucynta as per family request to minimize sedating medications  2/11- will try Cymbalta 30 mg QHS for nerve pain-   2/13: d/c nitrodur patch and  continue prn nitroglycerin 4. Mood: LCSW to evaluate and provide emotional support             -antipsychotic agents: n/a 5. Neuropsych: This patient is capable of making decisions on her own behalf. 6. Skin/Wound Care: Routine skin care checks             -- Monitor LLQ, left groin incision 7. Fluids/Electrolytes/Nutrition: Routine ins and outs and follow-up chemistries             -- Heart  healthy diet, thin liquids             --Glucose has been in normal range; DC SSI and follow-up BMET 8. Blood loss anemia: Monitor CBC 9. Hypertension: continue Coreg, Norvasc, Cozaar. Increase amlodipine to 10mg   2/11- BP well controlle-d con't regimen 10. Hyperlipidemia: continue Lipitor 11: Tobacco use: Provide cessation counseling             --Nicotine patch ?             --Continue pulmonary toilet 12: COPD: Continue Yupelri nebulizer daily 13: GERD: continue Protonix 14: Hyperthyroidismmulti-nodular goiter:  --Dr. Loanne Drilling, endocrinologist>>not on methimazole currently --TSH = 0.022 1/20, normal T3, T4 this admission 15: History of pulmonary nodules: stable in size of 5 mm nodule on 1/18 as compared to 2016, outpatient follow-up.  16. Constipation: add daily milk of mag prn 17. Feels cold- will order kpad for feeling cold/pain 18. Abdominal pain: resolved with treatment of constipation 19. Flank pain: placed nursing order to ice 15 minutes three times per day 20. Leukocytosis: trending downward, repeat Thursday  2/6- Last WBC 10.7- is better- will recheck per primary team- had very low temp of 99.2- but hasn't recurred- could be blankets- piled on bed? 21. Chest pain: nitroglycerin ordered and this helped.  22.  Premature ventricular complexes, outpatient follow-up with cardiology.  23. Incisional pain: better controlled- d/c nucynta since not helping. Discussed expected duration of pain 24. Cognitive impairment: daughter would like patient off all pain medications that can impair cognition-  discussed plan to wean off with daughter but patient prefers to go home with oxy 5 given the severity of her pain- discussed we can send this for her. Discussed that weaning off these medications will likely result in increased pain.  25. Stenosis of vessels on CTA: likely contributing to her pain given resolution of pain with nitroglycerin patch- d/c nitrodur patch since this did not help     LOS: 11 days A FACE TO FACE EVALUATION WAS PERFORMED  Martha Clan P Kynley Metzger 12/02/2021, 11:32 AM

## 2021-12-02 NOTE — Progress Notes (Signed)
Patient ID: Darlene Griffith, female   DOB: 1951/10/15, 71 y.o.   MRN: [id number]  Sw made aware that [employer] is attempting to make contact. Family provided with [employer] contact information to reach out. SW will cont to follow up.         Note Details  Author Dyanne Iha File Time 12/02/2021 10:43 AM  Author Type Social Worker Status Signed  Last Editor [place], [facility] # [id number] Admit Date 11/06/2021

## 2021-12-02 NOTE — Progress Notes (Addendum)
Verbal order from Dr. Adam Phenix ok to D/C nitroglycerin patch and ok to give up to 12 nitroglycerin  0.4 mg sl tabs an hour. Dr understand that nurse will have to stay with patient will distributing medication. Patient requested 3 nitroglycerin 0.4 mg tabs for chest pain not related to cardiac pain.  No new order from Dr. Adam Phenix at this time.

## 2021-12-02 NOTE — Progress Notes (Signed)
Inpatient Rehabilitation Care Coordinator Discharge Note   Patient Details  Name: Darlene Griffith MRN: [id number] Date of Birth: 12-30-1950   Discharge location: Home  Length of Stay: 12 Days  Discharge activity level: Sup/Cga  Home/community participation: patient daughters  Patient response [id number] Literacy - How often do you need to have someone help you when you read instructions, pamphlets, or other written material from your doctor or pharmacy?: Never  Patient response [id number] Isolation - How often do you feel lonely or isolated from those around you?: Never  Services provided included: SW, Pharmacy, TR, CM, RN, SLP, OT, PT, RD, MD  Financial Services:  Financial Services Utilized: [place] offered to/list presented to: n/a  Follow-up services arranged:  [place]: [facility]         Patient response to transportation need: Is the patient able to respond to transportation needs?: Yes In the past 12 months, has lack of transportation kept you from medical appointments or from getting medications?: No In the past 12 months, has lack of transportation kept you from meetings, work, or from getting things needed for daily living?: No    Comments (or additional information):  Patient/Family verbalized understanding of follow-up arrangements:  Yes  Individual responsible for coordination of the follow-up plan: [name] [phone]  Confirmed correct DME delivered: Dyanne Iha 12/02/2021    Dyanne Iha

## 2021-12-02 NOTE — Progress Notes (Signed)
Speech Language Pathology Discharge Summary  Patient Details  Name: Darlene Griffith MRN: [id number] Date of Birth: 1951-02-05  Today's Date: 12/02/2021 SLP Individual Time: 1415-1501 SLP Individual Time Calculation (min): 46 min   Skilled Therapeutic Interventions:  Skilled ST services focused on education and cognitive skills. Pt's daughter-in-law was present for education. SLP facilitated reassessment of cognitive linguistic skills utilizing SLUMS, pt scored 24/30 (n=>27) indicating impairments in memory, attention, and emergent awareness. SLP provided education to pt and pt's daughter-in-law, need for assistance in IADL tasks, short term memory, sustained/selective attention and error awareness. Pt was able to participate in limited therapeutic task due to pt supporting pain. SLP was unable to reach pt's primary contact, [name] via phone, but education was conducted with present parties and all questions answered to satisfaction. Pt missed 15 minutes of treatment due to pt falling asleep. Pt was left in room with call bell within reach and bed alarm set. SLP recommends to continue skilled services.    Patient has met 5 of 5 long term goals.  Patient to discharge at overall Modified Independent;Supervision;Min level.  Reasons goals not met:     Clinical Impression/Discharge Summary:   Pt made good progress meeting 5 out 5 goals discharging at min-supervision A. Pt's goals were downgrade during CIR course of stay due to limited participation, pt supports due to pain. SLP facilitated short term recall initiating memory notebook, functional problem solving to address error awareness in medication management tasks. Participation in activities were limited due to pain. Pt increased formal cognitive linguistic score on SLUM from 23 on evaluation to 24/30 (n=>27.) Barriers at d/c are self-limiting behaviors/impacting by pain, short term recall, emergent/safety awareness and selective attention. Pt benefited  from skilled ST services in order to maximize functional independence and reduce burden of care, requiring 24 hour supervision at discharge with continued skilled ST services. Care Partner:  Caregiver Able to Provide Assistance: Yes  Type of Caregiver Assistance: Physical;Cognitive  Recommendation:  Home Health SLP;24 hour supervision/assistance;Outpatient SLP  Rationale for SLP Follow Up: Maximize functional communication;Maximize cognitive function and independence;Reduce caregiver burden   Equipment: N/A   Reasons for discharge: Discharged from hospital   Patient/Family Agrees with Progress Made and Goals Achieved: Yes    [name]  [facility] 12/02/2021, 3:04 PM

## 2021-12-03 MED ORDER — DULOXETINE HCL 30 MG PO CPEP: 30.0000 mg | ORAL_CAPSULE | Freq: Every day | ORAL | 0 refills | Status: DC

## 2021-12-03 MED ORDER — AMLODIPINE BESYLATE 10 MG PO TABS: 10.0000 mg | ORAL_TABLET | Freq: Every day | ORAL | 0 refills | Status: DC

## 2021-12-03 MED ORDER — TRAZODONE HCL 50 MG PO TABS: 25.0000 mg | ORAL_TABLET | Freq: Every evening | ORAL | 0 refills | Status: DC | PRN

## 2021-12-03 MED ORDER — OXYCODONE HCL 5 MG PO TABS: 5.0000 mg | ORAL_TABLET | ORAL | 0 refills | Status: DC | PRN

## 2021-12-03 MED ORDER — SENNA 8.6 MG PO TABS: 2.0000 | ORAL_TABLET | Freq: Every day | ORAL | 0 refills | Status: AC

## 2021-12-03 MED ORDER — PROCHLORPERAZINE MALEATE 5 MG PO TABS: 5.0000 mg | ORAL_TABLET | Freq: Four times a day (QID) | ORAL | 0 refills | Status: AC | PRN

## 2021-12-03 MED ORDER — CARVEDILOL 25 MG PO TABS: 25.0000 mg | ORAL_TABLET | Freq: Two times a day (BID) | ORAL | 0 refills | Status: DC

## 2021-12-03 MED ORDER — ATORVASTATIN CALCIUM 40 MG PO TABS: 40.0000 mg | ORAL_TABLET | Freq: Every day | ORAL | 0 refills | Status: DC

## 2021-12-03 MED ORDER — NITROGLYCERIN 0.4 MG SL SUBL: 0.4000 mg | SUBLINGUAL_TABLET | SUBLINGUAL | 0 refills | Status: DC | PRN

## 2021-12-03 MED ORDER — PANTOPRAZOLE SODIUM 40 MG PO TBEC: 40.0000 mg | DELAYED_RELEASE_TABLET | Freq: Every day | ORAL | 0 refills | Status: DC

## 2021-12-03 MED ORDER — POLYETHYLENE GLYCOL 3350 17 G PO PACK: 17.0000 g | PACK | Freq: Every day | ORAL | 0 refills | Status: AC | PRN

## 2021-12-03 MED ORDER — ASCORBIC ACID 1000 MG PO TABS: 1000.0000 mg | ORAL_TABLET | Freq: Every day | ORAL | 0 refills | Status: DC

## 2021-12-03 MED ORDER — ACETAMINOPHEN 325 MG PO TABS
325.0000 mg | ORAL_TABLET | ORAL | Status: DC | PRN
Start: 2021-12-03 — End: 2024-02-09

## 2021-12-03 MED ORDER — LOSARTAN POTASSIUM 25 MG PO TABS: 25.0000 mg | ORAL_TABLET | Freq: Every day | ORAL | 0 refills | Status: DC

## 2021-12-03 NOTE — Progress Notes (Signed)
[facility]  Subjective  - right LQ pain, N/V   Assessment/Planning: 71 y.o. female is s/p Thoracic branched endovascular aortic repair, left CIA and EIA stenting and left CIA to CFA bypass with 44mm ringed PTFE on 11/11/2021  Extremities well perfused with palpable pulses Moving all ext.  Left LQ incision healing well, soft, NTTP.  This is an improvement.  Right LQ soft with reports of tenderness.  May need ducolax or fleets to assist with BM.  Fleets ordered. Plan to remove staples prior to discharge left UE and left LE May shower daily as needed and get incisions wet.  Objective (!) 144/67 88 98.7 F (37.1 C) 14 94%  Intake/Output Summary (Last 24 hours) at 12/03/2021 0736 Last data filed at 12/02/2021 1300 Gross per 24 hour  Intake 0 ml  Output --  Net 0 ml        Roxy Horseman 12/03/2021 7:36 AM --  Laboratory Lab Results: No results for input(s): WBC, HGB, HCT, PLT in the last 72 hours. BMET No results for input(s): NA, K, CL, CO2, GLUCOSE, BUN, CREATININE, CALCIUM in the last 72 hours.  COAG Lab Results  Component Value Date   INR 1.1 11/11/2021   INR 1.1 11/11/2021   INR 1.0 11/06/2021   No results found for: PTT

## 2021-12-03 NOTE — Progress Notes (Signed)
Patient continues to C/O chest discomfort and requests nitroglycerin which is effective for approximately 30 minutes at a time patient states. Patient had stapes removed by PA this AM. Patient requested OXY and Tylenol for discomfort from stapes being removed.  Patient being D/C this afternoon to personal home, family to provide transportation.

## 2021-12-04 DIAGNOSIS — I7100 Dissection of unspecified site of aorta: Secondary | ICD-10-CM | POA: Diagnosis not present

## 2021-12-05 ENCOUNTER — Telehealth: Payer: Self-pay

## 2021-12-05 ENCOUNTER — Telehealth: Payer: Self-pay | Admitting: *Deleted

## 2021-12-05 NOTE — Telephone Encounter (Signed)
Transitional Care call--who you talked with    Are you/is patient experiencing any problems since coming home? No appetite , increasing pain Are there any questions regarding any aspect of care? No Are there any questions regarding medications administration/dosing? No Are meds being taken as prescribed? Patient should review meds with caller to confirm Have there been any falls? No Has Home Health been to the house and/or have they contacted you? Yes If not, have you tried to contact them? Can we help you contact them? Are bowels and bladder emptying properly? Yes Are there any unexpected incontinence issues? No If applicable, is patient following bowel/bladder programs? Any fevers, problems with breathing, unexpected pain? No Are there any skin problems or new areas of breakdown? No Has the patient/family member arranged specialty MD follow up (ie cardiology/neurology/renal/surgical/etc)?  Yes Can we help arrange? Does the patient need any other services or support that we can help arrange? No Are caregivers following through as expected in assisting the patient? Yes Has the patient quit smoking, drinking alcohol, or using drugs as recommended? Yes  Appointment time 10:20 am, arrive time 10:00 am with Dr. Ranell Patrick on 02/13/22 [address]

## 2021-12-05 NOTE — Telephone Encounter (Signed)
While doing Transitional Care Call patient daughter states no one called to do a phone visit but she is with the patient all day tomorrow if she can have a phone visit tomorrow. Call daughter [phone].

## 2021-12-05 NOTE — Telephone Encounter (Signed)
Alex PT from [facility] called for PT POC 1wk1, 2wk2, 1wk6. He is also asking for  [facility]  1wk8 to assist with bathing and personal care.  Approval given.  He also reports that she her pain is not controlled with the current pain regimen.

## 2021-12-06 ENCOUNTER — Other Ambulatory Visit: Payer: Self-pay

## 2021-12-06 ENCOUNTER — Encounter
Payer: BC Managed Care – PPO | Attending: Physical Medicine and Rehabilitation | Admitting: Physical Medicine and Rehabilitation

## 2021-12-06 DIAGNOSIS — G8918 Other acute postprocedural pain: Secondary | ICD-10-CM | POA: Insufficient documentation

## 2021-12-06 DIAGNOSIS — G4701 Insomnia due to medical condition: Secondary | ICD-10-CM | POA: Diagnosis not present

## 2021-12-06 DIAGNOSIS — I10 Essential (primary) hypertension: Secondary | ICD-10-CM | POA: Insufficient documentation

## 2021-12-06 DIAGNOSIS — E7849 Other hyperlipidemia: Secondary | ICD-10-CM | POA: Insufficient documentation

## 2021-12-06 MED ORDER — OXYCODONE HCL 5 MG PO TABS: 5.0000 mg | ORAL_TABLET | ORAL | 0 refills | Status: DC | PRN

## 2021-12-06 MED ORDER — NITROGLYCERIN 0.4 MG SL SUBL: 0.4000 mg | SUBLINGUAL_TABLET | SUBLINGUAL | 0 refills | Status: DC | PRN

## 2021-12-06 NOTE — Progress Notes (Signed)
° °  Subjective:    Patient ID: Abigail Butts, female    DOB: 02-01-1951, 71 y.o.   MRN: [id number]  HPI An audio/video tele-health visit is felt to be the most appropriate encounter for this patient at this time. This is a follow up tele-visit via phone. The patient is at home. MD is at office. Prior to scheduling this appointment, our staff discussed the limitations of evaluation and management by telemedicine and the availability of in-person appointments. The patient expressed understanding and agreed to proceed.   Mrs. Ormand is a 71 year old woman presenting for hospital follow-up after CIR admission for debility.  1) postoperative pain -she continues to have severe postoperative pain at home -at first she felt like the oxycodone was not helping but when she stopped if pain greatly increased so she realized it was helping. She is due to run out soon and needs a refill -She also needs a refill of nitroglycerin which is also helping -her daughter says [facility] needs records from [place] prior to her being able to see them for a second opinion. Dr. Roselie Awkward made initial referral -she has been able to get out of bed and ambulate which she is happy about.     Review of Systems +postoperative pain    Objective:   Physical Exam Not performed as patient was seen via phone       Assessment & Plan:   Mrs. Plotner is a 71 year old woman presenting for hospital follow-up after CIR admission for debility.  1) postoperative pain -sent refills of nitroglycerin, oxycodone -discussed with patient and messaged Judeen Hammans to schedule her for hospital follow-up with Zella Ball at her next available, since the soonest in-person hospital follow-up for [place] together is in April.  -start gabapentin 300mg  TID- let me know if feeling sleepy with this -discussed risks of addiction, cognitive impairment, and constipation with oxycodone -recommended beets daily to improve vascular flow similarly to nitroglycerin  2)  insomnia: -discussed gabapentin at night should help. -if feeling too sleepy during the day with gabapentin, can double dose at night for better sleep  18 minutes spent in discussion of her postoperative pain, review of her prior CT with her and her daughter, discussion of her current pain medications and which are helping, assessing for any side effects, discussion of insomnia due to pain, discussion of starting gabapentin and its potentials benefits and side effects, discussing that I would send refills for her and schedule follow-up with Zella Ball

## 2021-12-09 ENCOUNTER — Telehealth: Payer: Self-pay

## 2021-12-09 ENCOUNTER — Telehealth: Payer: Self-pay | Admitting: Vascular Surgery

## 2021-12-09 DIAGNOSIS — K5900 Constipation, unspecified: Secondary | ICD-10-CM | POA: Diagnosis not present

## 2021-12-09 DIAGNOSIS — E0520 Thyrotoxicosis with toxic multinodular goiter without thyrotoxic crisis or storm: Secondary | ICD-10-CM | POA: Diagnosis not present

## 2021-12-09 DIAGNOSIS — Z79891 Long term (current) use of opiate analgesic: Secondary | ICD-10-CM | POA: Diagnosis not present

## 2021-12-09 DIAGNOSIS — G40909 Epilepsy, unspecified, not intractable, without status epilepticus: Secondary | ICD-10-CM | POA: Diagnosis not present

## 2021-12-09 DIAGNOSIS — Z9181 History of falling: Secondary | ICD-10-CM | POA: Diagnosis not present

## 2021-12-09 DIAGNOSIS — I10 Essential (primary) hypertension: Secondary | ICD-10-CM | POA: Diagnosis not present

## 2021-12-09 DIAGNOSIS — I7100 Dissection of unspecified site of aorta: Secondary | ICD-10-CM | POA: Diagnosis not present

## 2021-12-09 DIAGNOSIS — J449 Chronic obstructive pulmonary disease, unspecified: Secondary | ICD-10-CM | POA: Diagnosis not present

## 2021-12-09 DIAGNOSIS — F17210 Nicotine dependence, cigarettes, uncomplicated: Secondary | ICD-10-CM | POA: Diagnosis not present

## 2021-12-09 DIAGNOSIS — I493 Ventricular premature depolarization: Secondary | ICD-10-CM | POA: Diagnosis not present

## 2021-12-09 DIAGNOSIS — R4189 Other symptoms and signs involving cognitive functions and awareness: Secondary | ICD-10-CM | POA: Diagnosis not present

## 2021-12-09 DIAGNOSIS — Z48812 Encounter for surgical aftercare following surgery on the circulatory system: Secondary | ICD-10-CM | POA: Diagnosis not present

## 2021-12-09 DIAGNOSIS — K219 Gastro-esophageal reflux disease without esophagitis: Secondary | ICD-10-CM | POA: Diagnosis not present

## 2021-12-09 DIAGNOSIS — E785 Hyperlipidemia, unspecified: Secondary | ICD-10-CM | POA: Diagnosis not present

## 2021-12-09 DIAGNOSIS — N3281 Overactive bladder: Secondary | ICD-10-CM | POA: Diagnosis not present

## 2021-12-09 DIAGNOSIS — Z95820 Peripheral vascular angioplasty status with implants and grafts: Secondary | ICD-10-CM | POA: Diagnosis not present

## 2021-12-09 NOTE — Telephone Encounter (Signed)
Darlene Griffith, Speech therapist called to get verbal orders for patient. Once a week for 2 weeks. Left detailed voicemail to give verbal orders for patient

## 2021-12-09 NOTE — Telephone Encounter (Signed)
Darlene Griffith with [facility] called to inform us that the patient will not have a home health aide for bathing after 12/10/21. She stated the patient has been made aware

## 2021-12-09 NOTE — Telephone Encounter (Signed)
Told pt's daughter she will have to call medical records at the hospital to get her mother's information sent to [facility]. Their fax # is [phone]

## 2021-12-09 NOTE — Telephone Encounter (Signed)
Patient called and stated she is in severe pain and is out of the pain medication you prescribed on 12/06/21. Please advise

## 2021-12-10 ENCOUNTER — Other Ambulatory Visit: Payer: Self-pay | Admitting: Physical Medicine and Rehabilitation

## 2021-12-10 MED ORDER — OXYCODONE HCL 5 MG PO TABS: 5.0000 mg | ORAL_TABLET | ORAL | 0 refills | Status: DC | PRN

## 2021-12-11 DIAGNOSIS — E0520 Thyrotoxicosis with toxic multinodular goiter without thyrotoxic crisis or storm: Secondary | ICD-10-CM | POA: Diagnosis not present

## 2021-12-11 DIAGNOSIS — Z79891 Long term (current) use of opiate analgesic: Secondary | ICD-10-CM | POA: Diagnosis not present

## 2021-12-11 DIAGNOSIS — J449 Chronic obstructive pulmonary disease, unspecified: Secondary | ICD-10-CM | POA: Diagnosis not present

## 2021-12-11 DIAGNOSIS — Z95820 Peripheral vascular angioplasty status with implants and grafts: Secondary | ICD-10-CM | POA: Diagnosis not present

## 2021-12-11 DIAGNOSIS — E785 Hyperlipidemia, unspecified: Secondary | ICD-10-CM | POA: Diagnosis not present

## 2021-12-11 DIAGNOSIS — F17210 Nicotine dependence, cigarettes, uncomplicated: Secondary | ICD-10-CM | POA: Diagnosis not present

## 2021-12-11 DIAGNOSIS — Z48812 Encounter for surgical aftercare following surgery on the circulatory system: Secondary | ICD-10-CM | POA: Diagnosis not present

## 2021-12-11 DIAGNOSIS — N3281 Overactive bladder: Secondary | ICD-10-CM | POA: Diagnosis not present

## 2021-12-11 DIAGNOSIS — K219 Gastro-esophageal reflux disease without esophagitis: Secondary | ICD-10-CM | POA: Diagnosis not present

## 2021-12-11 DIAGNOSIS — R4189 Other symptoms and signs involving cognitive functions and awareness: Secondary | ICD-10-CM | POA: Diagnosis not present

## 2021-12-11 DIAGNOSIS — I10 Essential (primary) hypertension: Secondary | ICD-10-CM | POA: Diagnosis not present

## 2021-12-11 DIAGNOSIS — Z9181 History of falling: Secondary | ICD-10-CM | POA: Diagnosis not present

## 2021-12-11 DIAGNOSIS — I493 Ventricular premature depolarization: Secondary | ICD-10-CM | POA: Diagnosis not present

## 2021-12-11 DIAGNOSIS — G40909 Epilepsy, unspecified, not intractable, without status epilepticus: Secondary | ICD-10-CM | POA: Diagnosis not present

## 2021-12-11 DIAGNOSIS — K5900 Constipation, unspecified: Secondary | ICD-10-CM | POA: Diagnosis not present

## 2021-12-11 DIAGNOSIS — I7100 Dissection of unspecified site of aorta: Secondary | ICD-10-CM | POA: Diagnosis not present

## 2021-12-11 NOTE — Telephone Encounter (Signed)
Spoke with patient's daughter [name] and she stated She has been trying to wean the patient off the Oxycodone and has been only giving her half a tablet. She is concerned the patient may become dependent on it. She stated she has been giving her Tylenol every 6 hours and wants to know if she can have Motrin with it. She also wants to know if there is something like Gabapentin she can have. She stated the Gabapentin had negative effects on the patient in the hospital and had memory issues. She would like to know what to do or can be done. Please advise

## 2021-12-11 NOTE — Telephone Encounter (Signed)
Attempted to call patient but was unable to leave a voicemail

## 2021-12-11 NOTE — Addendum Note (Signed)
Addended by: Casilda Carls on: 12/11/2021 02:48 PM   Modules accepted: Orders

## 2021-12-12 ENCOUNTER — Other Ambulatory Visit: Payer: Self-pay

## 2021-12-12 ENCOUNTER — Telehealth: Payer: Self-pay | Admitting: Physical Medicine and Rehabilitation

## 2021-12-12 ENCOUNTER — Encounter: Payer: Self-pay | Admitting: Registered Nurse

## 2021-12-12 ENCOUNTER — Encounter (HOSPITAL_BASED_OUTPATIENT_CLINIC_OR_DEPARTMENT_OTHER): Payer: BC Managed Care – PPO | Admitting: Registered Nurse

## 2021-12-12 VITALS — BP 136/78 | HR 82 | Ht 60.98 in

## 2021-12-12 DIAGNOSIS — I10 Essential (primary) hypertension: Secondary | ICD-10-CM

## 2021-12-12 DIAGNOSIS — I7100 Dissection of unspecified site of aorta: Secondary | ICD-10-CM | POA: Diagnosis not present

## 2021-12-12 DIAGNOSIS — E7849 Other hyperlipidemia: Secondary | ICD-10-CM

## 2021-12-12 DIAGNOSIS — G4701 Insomnia due to medical condition: Secondary | ICD-10-CM | POA: Diagnosis not present

## 2021-12-12 DIAGNOSIS — G8918 Other acute postprocedural pain: Secondary | ICD-10-CM | POA: Diagnosis not present

## 2021-12-12 MED ORDER — NITROGLYCERIN 0.4 MG SL SUBL: 0.4000 mg | SUBLINGUAL_TABLET | SUBLINGUAL | 12 refills | Status: DC | PRN

## 2021-12-12 NOTE — Progress Notes (Signed)
Subjective:    Patient ID: Darlene Griffith, female    DOB: Mar 05, 1951, 71 y.o.   MRN: [id number]  HPI: Darlene Griffith is a 71 y.o. female who is here for Hospital Follow up visit of her  Aotic Dissection, Disability secondary to repair aortic dissection, Essential Hypertension and Hyperlipidemia. She presented to [facility] on 11/06/2021 with complaints of sudden onset of chest pain.  H&P: Andres Labrum PA on 11/06/2021 Subjective: This is a 71 yo woman with history of tobacco use disorder and COPD who had chest pain of sudden onset which has been persistent and progressive. Started in her back and was dull and required her to sit down, started radiating to her chest. When it didn't go away she instructed her daughter to call EMS.  CT Angio: Chest:  IMPRESSION: 1. 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 2. Intramural hematoma is identified within the distal arch and descending thoracic aorta measuring 7 mm in thickness. 3. The inferior mesenteric artery appears to arise off the false lumen. There is contrast opacification of the IMA however. No signs of bowel ischemia at this time. 4. No evidence for solid organ ischemia within the abdomen or pelvis. 5. Aortic Atherosclerosis (ICD10-I70.0) and Emphysema (ICD10-J43.9).  Vascular Surgery consulted  She underwent on 11/11/2021 : by Dr Stanford Breed  THORACIC AORTIC ENDOVASCULAR [place] N/A General  ULTRASOUND GUIDANCE FOR VASCULAR ACCESS Bilateral General  INTRAVASCULAR ULTRASOUND Right General  APPLICATION OF CELL SAVER N/A General  BYPASS GRAFT AORTA TO LEFT COMMON  FEMORAL ARTERY     Ms. Brisbin was admitted to inpatient rehabilitation on 11/21/2021 and discharged home on 12/03/2021. She was scheduled to receive [place]  with [facility]. Ms. Fromer states she had received several calls from therapist letting her know they have quit their job with [employer]. This provider sent an e-mail to Christine, she will look into the above.   Ms. Spurgin states she has pain in her lower back, right hip and generalized pain all over. She rates her pain 10. She also states she's taking her oxycodone (5 mg) 1/2 tablet, because she was running out of her oxycodone tablets. Dr Ranell Patrick sent her Oxycodone prescription to [facility] on 12/10/2021, she wasn't aware the prescription was sent to [facility].    I asked Ms. Eugene Garnet to take her medication as prescribed oxycodone 5 mg, her son expressed concerned with her taking the oxycodone as prescribed and stated they have only been administering 1/2 tablet due to their concerns as it relates to addiction.   Ms, Eugene Garnet also stated "her pain is intense and voiced concerned  not becoming addicted to the oxycodone, she states she had a sister who was an addict and she is aware what addiction can do to a family. We discussed the difference between dependency and addiction.  We discussed the weaning of the Oxycodone, he states they will pick up the oxycodone from [facility] today, and will send in a My-Chart message in a week with an update to Ms Eugene Garnet pain. Her son also stated " Oxycodone is a gate way drug to addiction and the family have only been administering 1/2 tablet of her oxycodone due to their concerns of addiction. Her son asked about THC, explained we don't prescribed THC,  and it was recommended Ms. Philbin not used THC , he stated " She hasn't used THC he was wondering what other treatment modalities were available to treat her pain. Discussed with Ms. Tesoriero and her son that, Dr Ranell Patrick uses various treatment options to treat pain, he verbalizes understanding. This will be discussed at her next office visit with Dr Ranell Patrick.  The above message was related to Dr Ranell Patrick, this  provider also sent a message to Dr Ranell Patrick to see if Ms. Kohan could be seen earlier than April, she has an appointment with Dr Ranell Patrick in March, she verbalizes understanding.   During the visit with Ms. Dack and her son,  the Son was texting his sister regarding the above visit discussion.  There was a knock on the door and Godfrey Pick asked if we would  could step out of the room. Ms. Reardon daughter had called office with concerns, Zorita Pang spoke with Ms. Akkerman daughter on the phone, and spoke with this provider and son regarding the conversation.   Ms. Budde became frustrated and stated she wanted to leave the office, this provider was able to encourage her to stay and complete the office visit.  All questions were answered.   Ms. Brimage states she has a poor appetite and we discussed different dietary options, she verbalizes understanding.   Ms. Gafford arrived in wheelchair.  Son in room, all questions answered.    Pain Inventory Average Pain 10 Pain Right Now 10 My pain is constant, burning, stabbing, aching, and "feels like a banging pain"  In the last 24 hours, has pain interfered with the following? General activity 10 Relation with others 10 Enjoyment of life 10 What TIME of day is your pain at its worst? morning , daytime, evening, and night Sleep (in general) Poor  Pain is worse with: walking, bending, sitting, inactivity, standing, and some activites Pain improves with: heat/ice and medication Relief from Meds: 5  use a walker ability to climb steps?  no do you drive?  no use a wheelchair  employed # of hrs/week 40  weakness numbness tingling trouble walking anxiety loss of taste or smell  Any changes since last visit?  no  Any changes since last visit?  no    Family History  Problem Relation Age of Onset   Heart attack Maternal Grandmother        deceased   High blood pressure Mother    High Cholesterol Mother    Thyroid disease Mother     Thyroid disease Sister    Lung cancer Father    Cancer Maternal Uncle        unk type   Cancer Sister        unk type   Colon cancer Neg Hx    Colon polyps Neg Hx    Esophageal cancer Neg Hx    Rectal cancer Neg Hx    Stomach cancer Neg Hx    Social History   Socioeconomic History   Marital status: Single    Spouse name: Not on file   Number of children: Not on file   Years of education: Not on file   Highest education level: Not on file  Occupational History   Occupation: retired  Tobacco Use   Smoking status: Light Smoker    Types: Cigarettes   Smokeless tobacco: Never   Tobacco comments:    1-2 NOT EVERY DAY   Vaping Use   Vaping Use: Never used  Substance  and Sexual Activity   Alcohol use: Yes    Alcohol/week: 0.0 standard drinks    Comment: rare   Drug use: No   Sexual activity: Not Currently  Other Topics Concern   Not on file  Social History Narrative   Not on file   Social Determinants of Health   Financial Resource Strain: Not on file  Food Insecurity: Not on file  Transportation Needs: Not on file  Physical Activity: Not on file  Stress: Not on file  Social Connections: Not on file   Past Surgical History:  Procedure Laterality Date   BYPASS GRAFT AORTA TO AORTA Left 11/11/2021   Procedure: BYPASS GRAFT AORTA TO LEFT COMMON  FEMORAL ARTERY;  Surgeon: Cherre Robins, MD;  Location: [facility];  Service: Vascular;  Laterality: Left;   CERVICAL DISC ARTHROPLASTY N/A 07/05/2019   Procedure: Cervical Six-Seven Artificial disc replacement;  Surgeon: Kristeen Miss, MD;  Location: [facility];  Service: Neurosurgery;  Laterality: N/A;  Cervical Six-Seven Artificial disc replacement   CESAREAN SECTION     COLONOSCOPY  01/13/2020   THORACIC AORTIC ENDOVASCULAR STENT GRAFT N/A 11/11/2021   Procedure: THORACIC AORTIC ENDOVASCULAR STENT GRAFT WITH LEFT BRACHIAL  ARTERY ACCESS;  Surgeon: Cherre Robins, MD;  Location: [facility] OR;  Service: Vascular;  Laterality: N/A;    TONSILLECTOMY     AROUND 5-6 YRS OLD   ULTRASOUND GUIDANCE FOR VASCULAR ACCESS Bilateral 11/11/2021   Procedure: ULTRASOUND GUIDANCE FOR VASCULAR ACCESS;  Surgeon: Cherre Robins, MD;  Location: [employer];  Service: Vascular;  Laterality: Bilateral;   UPPER GASTROINTESTINAL ENDOSCOPY  01/13/2020   Past Medical History:  Diagnosis Date   Chest pain    a. 01/2015 Echo: Nl LV fxn, Gr 1 DD, triv AI, mild MR.   Essential hypertension    Family history of lung cancer    Hepatic cyst    a. noted on CT 01/2015.   Hyperthyroidism    GOING TO [facility] FOR SECOND OPINION   Multinodular goiter    a. 01/2015 CT chest: multinodular goidter w/ substernal extension of the left lobe of the thyroid assoc w/ rightward deviation of tracheal air column.   Neck pain, chronic    Personal history of colonic polyps    Pulmonary nodules    a. 01/2015 CT Chest: RLL ~ 62mm subpleural nodule - rec f/u in 6-12 mos.   Splenic cyst    a. noted on CT 01/2015.   BP 136/78    Pulse 82    Ht 5' 0.98" (1.549 m)    SpO2 97%    BMI 28.59 kg/m   Opioid Risk Score:   Fall Risk Score:  `1  Depression screen PHQ 2/9  Depression screen [facility] 2/9 12/12/2021 10/26/2020 12/04/2019 07/28/2018 11/13/2016  Decreased Interest 3 0 0 0 2  Down, Depressed, Hopeless 3 0 0 0 1  PHQ - 2 Score 6 0 0 0 3  Altered sleeping 3 0 - - 3  Tired, decreased energy 3 0 - - 3  Change in appetite 3 0 - - 0  Feeling bad or failure about yourself  0 0 - - 0  Trouble concentrating 2 0 - - 0  Moving slowly or fidgety/restless 3 0 - - 0  Suicidal thoughts 0 0 - - 0  PHQ-9 Score 20 0 - - 9  Difficult doing work/chores - Not difficult at all - - Somewhat difficult     Review of Systems  Constitutional: Negative.  HENT: Negative.    Eyes: Negative.   Respiratory: Negative.    Cardiovascular: Negative.   Gastrointestinal: Negative.   Endocrine: Negative.   Genitourinary: Negative.   Musculoskeletal:  Positive for gait problem.  Skin: Negative.    Allergic/Immunologic: Negative.   Neurological:  Positive for weakness and numbness.  Hematological: Negative.   Psychiatric/Behavioral:  Positive for sleep disturbance. The patient is nervous/anxious.       Objective:   Physical Exam Vitals and nursing note reviewed.  Constitutional:      Appearance: Normal appearance.  Cardiovascular:     Rate and Rhythm: Normal rate and regular rhythm.     Pulses: Normal pulses.     Heart sounds: Normal heart sounds.  Pulmonary:     Effort: Pulmonary effort is normal.     Breath sounds: Normal breath sounds.  Musculoskeletal:     Cervical back: Normal range of motion and neck supple.     Comments: Normal Muscle Bulk and Muscle Testing Reveals:  Upper Extremities: Right: Full ROM and Muscle Strength 5/5 Left Upper Extremity: Decreased ROM 45 Degrees and Muscle Strength 4/5 Lower Extremities: Full ROM and Muscle Strength 5/5 Arrived In Wheelchair     Skin:    General: Skin is warm and dry.  Neurological:     Mental Status: She is alert and oriented to person, place, and time.  Psychiatric:        Mood and Affect: Mood normal.        Behavior: Behavior normal.         Assessment & Plan:  1.Aortic Dissection: Disability secondary to repair aortic dissection,: S/P on 11/11/2021 by Dr Stanford Breed.       Procedure Laterality Anesthesia  THORACIC AORTIC ENDOVASCULAR STENT GRAFT WITH LEFT BRACHIAL  ARTERY ACCESS N/A General  ULTRASOUND GUIDANCE FOR VASCULAR ACCESS Bilateral General  INTRAVASCULAR ULTRASOUND Right General  APPLICATION OF CELL SAVER N/A General  BYPASS GRAFT AORTA TO LEFT COMMON  FEMORAL ARTERY      She has a scheduled appointment with Dr Stanford Breed.  2. Essential Hypertension: Continue current medication regimen. She has a scheduled appointment with PCP.  3. Hyperlipidemia.: Continue current medication regimen. She has a scheduled appointment with PCP.  4. Post-operative Pain: Continue Oxycodone. Ms. Belgard son or daughter will  send a My-Chart message with update on her Pain. We discuss slow weaning of Oxycodone, will await My-Chart message to Dr Ranell Patrick or this provider. Continue to Monitor.  Dr Ranell Patrick will refill her Nitroglycerine. Ms. Sinopoli and son verbalizes understanding.   F/U with Dr Ranell Patrick in 1 month

## 2021-12-12 NOTE — Progress Notes (Signed)
HPI: Darlene Griffith is a 71 y.o. female, who is here today with her granddaughter to follow on recent hospitalization. She was admitted on 11/06/21 and discharged to inpt rehab on 11/21/21 and discharged home on 12/03/21.  Transitional care call on 12/05/21.  She presented to the ER via EMS with sudden onset of severe CP. BP upon admission was elevated at 190/99 and 187/97.  EKG w/ sinus rhythm. Troponin and BNP WNL. CTA shows type B aortic dissection that extends into the infrarenal abdominal aorta and right common iliac artery. D-dimer 4.78.   Admitted to CVICU and IV esmolol initially and changed to labetalol drip for BP control.   On 11/11/21 she underwent bilateral ultrasound-guided common femoral artery access,left brachial artery exposure and percutaneous access, thoracic branched endovascular aortic repair,left common and external iliac artery stenting, and left common iliac to common femoral artery bypass.  [facility] services have been arranged but due to short stuff ,it has not been consistency with PT or OT services. He granddaughter is staying with her and in a week her daughter will take over.  C/O severe pain, mainly right lower back. Slowly getting better. She is on Oxycodone 5 mg q 4 hours prn.  Followed with physical medicine and rehabilitation yesterday.  She is not taking Gabapentin because she has not tolerated it in the past, it caused memory issues that did not resolve after discontinuing medication. She is on Duloxetine 30 mg daily, started on 11/30/21.  States that she is planing on establishing with providers at [facility]. Hyperthyroidism: She is not on pharmacologic treatment. Follows with endocrinologist.  Lab Results  Component Value Date   TSH 0.022 (L) 11/08/2021   HTN: She is on Carvedilol 25 mg bid,Losartan 25 mg daily,and Amlodipine 10 mg daily. Some SBP have been elevated, she does not remember all readings, 140's. C/O sharp intermittent pain under left  breast, "not frequent." Usually when she is resting and sometimes associated with diaphoresis. Negative for associated SOB or palpitations. It is not radiated. Sometimes she take nitro sl and pain resolved in about 10 minutes.  Component     Latest Ref Rng & Units 11/22/2021          Sodium     135 - 145 mEq/L 137  Potassium     3.5 - 5.1 mEq/L 3.8  Chloride     96 - 112 mEq/L 101  CO2     19 - 32 mEq/L 27  Glucose     70 - 99 mg/dL 90  BUN     6 - 23 mg/dL 13  Creatinine     0.40 - 1.20 mg/dL 0.47  Calcium     8.4 - 10.5 mg/dL 8.6 (L)  GFR, Estimated     >60 mL/min >60  Anion gap     5 - 15 9   Mildly abnormal LFT's. Constipation has improved, milk helps. She is having bowel movements q 2 days. She is not taking stool softener.  Component     Latest Ref Rng & Units 11/22/2021          Calcium     8.4 - 10.5 mg/dL 8.6 (L)  Total Protein     6.0 - 8.3 g/dL 6.0 (L)  Albumin     3.5 - 5.2 g/dL 2.2 (L)  AST     0 - 37 U/L 53 (H)  ALT     0 - 35 U/L 44  Alkaline Phosphatase     39 -  117 U/L 77  Total Bilirubin     0.2 - 1.2 mg/dL 0.6   Review of Systems  Constitutional:  Positive for activity change and fatigue. Negative for fever.  HENT:  Negative for mouth sores, nosebleeds and sore throat.   Eyes:  Negative for redness and visual disturbance.  Respiratory:  Negative for cough and wheezing.   Cardiovascular:  Negative for leg swelling.  Gastrointestinal:  Negative for abdominal pain, nausea and vomiting.  Genitourinary:  Negative for decreased urine volume and hematuria.  Musculoskeletal:  Positive for back pain.  Skin:  Negative for rash.  Neurological:  Negative for syncope, weakness and headaches.  Psychiatric/Behavioral:  Positive for sleep disturbance. Negative for confusion. The patient is nervous/anxious.   Rest see pertinent positives and negatives per HPI.  Current Outpatient Medications on File Prior to Visit  Medication Sig Dispense Refill    acetaminophen (TYLENOL) 325 MG tablet Take 1-2 tablets (325-650 mg total) by mouth every 4 (four) hours as needed for mild pain.     amLODipine (NORVASC) 10 MG tablet Take 1 tablet (10 mg total) by mouth daily. 30 tablet 0   ascorbic acid (VITAMIN C) 1000 MG tablet Take 1 tablet (1,000 mg total) by mouth daily. 30 tablet 0   atorvastatin (LIPITOR) 40 MG tablet Take 1 tablet (40 mg total) by mouth daily. 30 tablet 0   carvedilol (COREG) 25 MG tablet Take 1 tablet (25 mg total) by mouth 2 (two) times daily with a meal. 60 tablet 0   clotrimazole-betamethasone (LOTRISONE) cream Apply to affected area 2 times daily prn 15 g 0   diclofenac Sodium (VOLTAREN) 1 % GEL Apply 4 g topically 4 (four) times daily. 100 g 0   DULoxetine (CYMBALTA) 30 MG capsule Take 1 capsule (30 mg total) by mouth at bedtime. 30 capsule 0   losartan (COZAAR) 25 MG tablet Take 1 tablet (25 mg total) by mouth daily. 30 tablet 0   MYRBETRIQ 25 MG TB24 tablet Take 1 tablet by mouth once daily 30 tablet 0   nitroGLYCERIN (NITROSTAT) 0.4 MG SL tablet Place 1 tablet (0.4 mg total) under the tongue every 5 (five) minutes as needed for chest pain. May take up to 3 tabs in one hour and no more than 10 tablets in 24 hours. 30 tablet 0   nitroGLYCERIN (NITROSTAT) 0.4 MG SL tablet Place 1 tablet (0.4 mg total) under the tongue every 5 (five) minutes as needed for chest pain. 30 tablet 12   oxyCODONE (OXY IR/ROXICODONE) 5 MG immediate release tablet Take 1 tablet (5 mg total) by mouth every 4 (four) hours as needed for severe pain. 30 tablet 0   pantoprazole (PROTONIX) 40 MG tablet Take 1 tablet (40 mg total) by mouth at bedtime. 30 tablet 0   polyethylene glycol (MIRALAX / GLYCOLAX) 17 g packet Take 17 g by mouth daily as needed for mild constipation. 14 each 0   prochlorperazine (COMPAZINE) 5 MG tablet Take 1-2 tablets (5-10 mg total) by mouth every 6 (six) hours as needed for nausea. 30 tablet 0   senna (SENOKOT) 8.6 MG TABS tablet Take 2  tablets (17.2 mg total) by mouth at bedtime. 60 tablet 0   Tiotropium Bromide Monohydrate (SPIRIVA RESPIMAT) 2.5 MCG/ACT AERS Inhale 2 puffs into the lungs daily. 4 g 0   traZODone (DESYREL) 50 MG tablet Take 0.5-1 tablets (25-50 mg total) by mouth at bedtime as needed for sleep. 20 tablet 0   No current facility-administered medications on  file prior to visit.   Past Medical History:  Diagnosis Date   Chest pain    a. 01/2015 Echo: Nl LV fxn, Gr 1 DD, triv AI, mild MR.   Essential hypertension    Family history of lung cancer    Hepatic cyst    a. noted on CT 01/2015.   Hyperthyroidism    GOING TO [facility] FOR SECOND OPINION   Multinodular goiter    a. 01/2015 CT chest: multinodular goidter w/ substernal extension of the left lobe of the thyroid assoc w/ rightward deviation of tracheal air column.   Neck pain, chronic    Personal history of colonic polyps    Pulmonary nodules    a. 01/2015 CT Chest: RLL ~ 27mm subpleural nodule - rec f/u in 6-12 mos.   Splenic cyst    a. noted on CT 01/2015.   Allergies  Allergen Reactions   Shellfish Allergy Anaphylaxis   Lidocaine Other (See Comments)    Lidocaine patches caused burning   Social History   Socioeconomic History   Marital status: Single    Spouse name: Not on file   Number of children: Not on file   Years of education: Not on file   Highest education level: Not on file  Occupational History   Occupation: retired  Tobacco Use   Smoking status: Light Smoker    Types: Cigarettes   Smokeless tobacco: Never   Tobacco comments:    1-2 NOT EVERY DAY   Vaping Use   Vaping Use: Never used  Substance and Sexual Activity   Alcohol use: Yes    Alcohol/week: 0.0 standard drinks    Comment: rare   Drug use: No   Sexual activity: Not Currently  Other Topics Concern   Not on file  Social History Narrative   Not on file   Social Determinants of Health   Financial Resource Strain: Not on file  Food Insecurity: Not on file   Transportation Needs: Not on file  Physical Activity: Not on file  Stress: Not on file  Social Connections: Not on file   Vitals:   12/13/21 1003  BP: 120/70  Pulse: 82  Resp: 16  SpO2: 98%   Body mass index is 28.59 kg/m.  Physical Exam Vitals and nursing note reviewed.  Constitutional:      General: She is not in acute distress.    Appearance: She is well-developed.  HENT:     Head: Normocephalic and atraumatic.     Mouth/Throat:     Mouth: Mucous membranes are moist.     Pharynx: Oropharynx is clear.  Eyes:     Conjunctiva/sclera: Conjunctivae normal.  Cardiovascular:     Rate and Rhythm: Normal rate and regular rhythm.     Pulses:          Posterior tibial pulses are 2+ on the right side and 2+ on the left side.     Heart sounds: Murmur (SEM I-II/VI RUSB and LUSB) heard.  Pulmonary:     Effort: Pulmonary effort is normal. No respiratory distress.     Breath sounds: Normal breath sounds.  Abdominal:     Palpations: Abdomen is soft. There is no hepatomegaly or mass.     Tenderness: There is no abdominal tenderness.  Musculoskeletal:     Thoracic back: Tenderness present. No bony tenderness.     Lumbar back: Tenderness present. No bony tenderness.       Back:  Lymphadenopathy:     Cervical: No  cervical adenopathy.  Skin:    General: Skin is warm.     Findings: No erythema or rash.  Neurological:     General: No focal deficit present.     Mental Status: She is alert and oriented to person, place, and time.     Cranial Nerves: No cranial nerve deficit.     Gait: Gait normal.  Psychiatric:        Mood and Affect: Mood is anxious.     Comments: Well groomed, good eye contact.   ASSESSMENT AND PLAN:  Darlene Griffith was seen today for hospitalization follow-up.  Diagnoses and all orders for this visit: Orders Placed This Encounter  Procedures   Basic metabolic panel   Hepatic function panel   CBC   TSH   T4, free   Ambulatory referral to [facility]    Lab Results  Component Value Date   TSH <0.01 Repeated and verified X2. (L) 12/13/2021   Lab Results  Component Value Date   WBC 8.8 12/13/2021   HGB 11.3 (L) 12/13/2021   HCT 34.0 (L) 12/13/2021   MCV 93.5 12/13/2021   PLT 439.0 (H) 12/13/2021   Lab Results  Component Value Date   CREATININE 0.42 12/13/2021   BUN 9 12/13/2021   NA 137 12/13/2021   K 3.6 12/13/2021   CL 99 12/13/2021   CO2 32 12/13/2021   Lab Results  Component Value Date   ALT 16 12/13/2021   AST 16 12/13/2021   ALKPHOS 115 12/13/2021   BILITOT 0.3 12/13/2021   Elevated transaminase level Mild AST elevated.  Denies alcohol intake. [place]thr recommendations according to lab result.  Chronic bilateral low back pain without sciatica Pain is not well controlled. She is on Oxycodone and Duloxetine. Keep f/u appt with pain management/rehab medicine.  Will place another [facility] referral to try a different agency.  Chest pain, unspecified type She was evaluated for CP while in inpt rehab nd thought to me musculoskeletal. Chest CTA was repeated on 11/28/21:Graft patent without mention of endoleak.   She prefers to hols on cardio referral. Instructed about warning signs.  Essential hypertension BP adequately controlled. Continue Carvedilol 25 mg bid,Amlodipine 10 mg,and Losartan 25 mg daily. Low salt diet. Monitor BP at home.  Hyperthyroidism Following with endocrinologist. She is not on treatment, not sure if she was supposed to be on Methimazole.  Aortic dissection (HCC) S/P vascular repair. She is not longer smoking. Following with vascular surgeon.  Instructed about warning signs.  Return in about 3 months (around 03/12/2022).  Zayde Stroupe G. [place], MD  [facility]. [place] office.

## 2021-12-12 NOTE — Telephone Encounter (Signed)
Ptn Daughter [name] t/o very concerned and upset about ptn in room with Zella Ball discussing narcotics.  They have contacted Dr. Ranell Patrick and expressed concern Mom does not need RX.  Gapapentin causing memory issues and oxy is becoming dependent situation  for ptn.  Daughter hung up on me  - I went to room pulled NP and son out explained what is happening.  I explained to son mother appears to still be n charge of her own healthcare they need to endeavor to get medical power of attorney as she as patient is still in control of her healthcare decisions.  Mother became irate that I had pulled caregiver and son out and demanded to leave. NP and son took patient back in room to wrap up. I called sister back (other sister on conference call) explained the scenario again expressed concern patient has control and is calling MD on her own.  I told them I would have Dr contact them directly immediately.  Called KR explained situation and phone number

## 2021-12-13 ENCOUNTER — Telehealth: Payer: Self-pay | Admitting: Family Medicine

## 2021-12-13 ENCOUNTER — Ambulatory Visit (INDEPENDENT_AMBULATORY_CARE_PROVIDER_SITE_OTHER): Payer: BC Managed Care – PPO | Admitting: Family Medicine

## 2021-12-13 ENCOUNTER — Encounter: Payer: Self-pay | Admitting: Family Medicine

## 2021-12-13 VITALS — BP 120/70 | HR 82 | Resp 16 | Ht 60.98 in

## 2021-12-13 DIAGNOSIS — I7100 Dissection of unspecified site of aorta: Secondary | ICD-10-CM

## 2021-12-13 DIAGNOSIS — I493 Ventricular premature depolarization: Secondary | ICD-10-CM | POA: Diagnosis not present

## 2021-12-13 DIAGNOSIS — G8929 Other chronic pain: Secondary | ICD-10-CM

## 2021-12-13 DIAGNOSIS — R4189 Other symptoms and signs involving cognitive functions and awareness: Secondary | ICD-10-CM | POA: Diagnosis not present

## 2021-12-13 DIAGNOSIS — M545 Low back pain, unspecified: Secondary | ICD-10-CM | POA: Diagnosis not present

## 2021-12-13 DIAGNOSIS — R7401 Elevation of levels of liver transaminase levels: Secondary | ICD-10-CM

## 2021-12-13 DIAGNOSIS — E0520 Thyrotoxicosis with toxic multinodular goiter without thyrotoxic crisis or storm: Secondary | ICD-10-CM | POA: Diagnosis not present

## 2021-12-13 DIAGNOSIS — J449 Chronic obstructive pulmonary disease, unspecified: Secondary | ICD-10-CM | POA: Diagnosis not present

## 2021-12-13 DIAGNOSIS — E785 Hyperlipidemia, unspecified: Secondary | ICD-10-CM | POA: Diagnosis not present

## 2021-12-13 DIAGNOSIS — Z48812 Encounter for surgical aftercare following surgery on the circulatory system: Secondary | ICD-10-CM | POA: Diagnosis not present

## 2021-12-13 DIAGNOSIS — F17210 Nicotine dependence, cigarettes, uncomplicated: Secondary | ICD-10-CM | POA: Diagnosis not present

## 2021-12-13 DIAGNOSIS — I10 Essential (primary) hypertension: Secondary | ICD-10-CM

## 2021-12-13 DIAGNOSIS — E0590 Thyrotoxicosis, unspecified without thyrotoxic crisis or storm: Secondary | ICD-10-CM | POA: Diagnosis not present

## 2021-12-13 DIAGNOSIS — G40909 Epilepsy, unspecified, not intractable, without status epilepticus: Secondary | ICD-10-CM | POA: Diagnosis not present

## 2021-12-13 DIAGNOSIS — Z9181 History of falling: Secondary | ICD-10-CM | POA: Diagnosis not present

## 2021-12-13 DIAGNOSIS — K219 Gastro-esophageal reflux disease without esophagitis: Secondary | ICD-10-CM | POA: Diagnosis not present

## 2021-12-13 DIAGNOSIS — Z95820 Peripheral vascular angioplasty status with implants and grafts: Secondary | ICD-10-CM | POA: Diagnosis not present

## 2021-12-13 DIAGNOSIS — K5900 Constipation, unspecified: Secondary | ICD-10-CM | POA: Diagnosis not present

## 2021-12-13 DIAGNOSIS — N3281 Overactive bladder: Secondary | ICD-10-CM | POA: Diagnosis not present

## 2021-12-13 DIAGNOSIS — Z79891 Long term (current) use of opiate analgesic: Secondary | ICD-10-CM | POA: Diagnosis not present

## 2021-12-13 DIAGNOSIS — R079 Chest pain, unspecified: Secondary | ICD-10-CM

## 2021-12-13 LAB — HEPATIC FUNCTION PANEL
ALT: 16 U/L (ref 0–35)
AST: 16 U/L (ref 0–37)
Albumin: 3.8 g/dL (ref 3.5–5.2)
Alkaline Phosphatase: 115 U/L (ref 39–117)
Bilirubin, Direct: 0 mg/dL (ref 0.0–0.3)
Total Bilirubin: 0.3 mg/dL (ref 0.2–1.2)
Total Protein: 8.3 g/dL (ref 6.0–8.3)

## 2021-12-13 LAB — BASIC METABOLIC PANEL
BUN: 9 mg/dL (ref 6–23)
CO2: 32 mEq/L (ref 19–32)
Calcium: 10.3 mg/dL (ref 8.4–10.5)
Chloride: 99 mEq/L (ref 96–112)
Creatinine, Ser: 0.42 mg/dL (ref 0.40–1.20)
GFR: 98.83 mL/min (ref >60.00)
Glucose, Bld: 86 mg/dL (ref 70–99)
Potassium: 3.6 mEq/L (ref 3.5–5.1)
Sodium: 137 mEq/L (ref 135–145)

## 2021-12-13 LAB — CBC
HCT: 34 % — ABNORMAL LOW (ref 36.0–46.0)
Hemoglobin: 11.3 g/dL — ABNORMAL LOW (ref 12.0–15.0)
MCHC: 33.3 g/dL (ref 30.0–36.0)
MCV: 93.5 fl (ref 78.0–100.0)
Platelets: 439 10*3/uL — ABNORMAL HIGH (ref 150.0–400.0)
RBC: 3.64 Mil/uL — ABNORMAL LOW (ref 3.87–5.11)
RDW: 16.2 % — ABNORMAL HIGH (ref 11.5–15.5)
WBC: 8.8 10*3/uL (ref 4.0–10.5)

## 2021-12-13 LAB — T4, FREE: Free T4: 1.72 ng/dL — ABNORMAL HIGH (ref 0.60–1.60)

## 2021-12-13 LAB — TSH: TSH: <0.01 u[IU]/mL — ABNORMAL LOW (ref 0.35–5.50)

## 2021-12-13 NOTE — Assessment & Plan Note (Signed)
BP adequately controlled. Continue Carvedilol 25 mg bid,Amlodipine 10 mg,and Losartan 25 mg daily. Low salt diet. Monitor BP at home.

## 2021-12-13 NOTE — Telephone Encounter (Signed)
Darlene Griffith spoke with pt daughter and pt is with [facility]. Pt daughter stated that when they say they're coming out they dont show up and after pt is scheduled they will say they can't come. Darlene Griffith stated she needs to call them and find out what's going on. Pt daughter is not happy with [facility] pt daughter will call karen back if she can switch. Pt has [facility]. Pt discharged to rehab.

## 2021-12-13 NOTE — Assessment & Plan Note (Addendum)
S/P vascular repair. She is not longer smoking. Following with vascular surgeon.  Instructed about warning signs.

## 2021-12-13 NOTE — Assessment & Plan Note (Addendum)
Following with endocrinologist. She is not on treatment, not sure if she was supposed to be on Methimazole.

## 2021-12-13 NOTE — Patient Instructions (Signed)
A few things to remember from today's visit:   Essential hypertension - Plan: Basic metabolic panel, Hepatic function panel, CBC  Hyperthyroidism - Plan: TSH, T4, free  Elevated transaminase level  Chronic bilateral low back pain without sciatica  Dissection of aorta, unspecified portion of aorta ([place])  If you need refills please call your pharmacy. Do not use My Chart to request refills or for acute issues that need immediate attention.   No changes today. Monitor blood pressure daily. Keep appt with cardiothoracic surgeon and pain management. I will place a new home health referral. Let me know if you need referral to see cardio at [facility]  Please be sure medication list is accurate. If a new problem present, please set up appointment sooner than planned today.

## 2021-12-14 ENCOUNTER — Encounter: Payer: Self-pay | Admitting: Family Medicine

## 2021-12-14 MED ORDER — METHIMAZOLE 5 MG PO TABS: 2.5000 mg | ORAL_TABLET | Freq: Three times a day (TID) | ORAL | 0 refills | Status: DC

## 2021-12-16 ENCOUNTER — Telehealth: Payer: Self-pay

## 2021-12-16 ENCOUNTER — Telehealth: Payer: Self-pay | Admitting: Family Medicine

## 2021-12-16 DIAGNOSIS — Z79891 Long term (current) use of opiate analgesic: Secondary | ICD-10-CM | POA: Diagnosis not present

## 2021-12-16 DIAGNOSIS — Z95820 Peripheral vascular angioplasty status with implants and grafts: Secondary | ICD-10-CM | POA: Diagnosis not present

## 2021-12-16 DIAGNOSIS — G40909 Epilepsy, unspecified, not intractable, without status epilepticus: Secondary | ICD-10-CM | POA: Diagnosis not present

## 2021-12-16 DIAGNOSIS — K219 Gastro-esophageal reflux disease without esophagitis: Secondary | ICD-10-CM | POA: Diagnosis not present

## 2021-12-16 DIAGNOSIS — I493 Ventricular premature depolarization: Secondary | ICD-10-CM | POA: Diagnosis not present

## 2021-12-16 DIAGNOSIS — F17210 Nicotine dependence, cigarettes, uncomplicated: Secondary | ICD-10-CM | POA: Diagnosis not present

## 2021-12-16 DIAGNOSIS — K5900 Constipation, unspecified: Secondary | ICD-10-CM | POA: Diagnosis not present

## 2021-12-16 DIAGNOSIS — I7100 Dissection of unspecified site of aorta: Secondary | ICD-10-CM | POA: Diagnosis not present

## 2021-12-16 DIAGNOSIS — I10 Essential (primary) hypertension: Secondary | ICD-10-CM | POA: Diagnosis not present

## 2021-12-16 DIAGNOSIS — N3281 Overactive bladder: Secondary | ICD-10-CM | POA: Diagnosis not present

## 2021-12-16 DIAGNOSIS — J449 Chronic obstructive pulmonary disease, unspecified: Secondary | ICD-10-CM | POA: Diagnosis not present

## 2021-12-16 DIAGNOSIS — E785 Hyperlipidemia, unspecified: Secondary | ICD-10-CM | POA: Diagnosis not present

## 2021-12-16 DIAGNOSIS — R4189 Other symptoms and signs involving cognitive functions and awareness: Secondary | ICD-10-CM | POA: Diagnosis not present

## 2021-12-16 DIAGNOSIS — Z48812 Encounter for surgical aftercare following surgery on the circulatory system: Secondary | ICD-10-CM | POA: Diagnosis not present

## 2021-12-16 DIAGNOSIS — Z9181 History of falling: Secondary | ICD-10-CM | POA: Diagnosis not present

## 2021-12-16 DIAGNOSIS — E0520 Thyrotoxicosis with toxic multinodular goiter without thyrotoxic crisis or storm: Secondary | ICD-10-CM | POA: Diagnosis not present

## 2021-12-16 NOTE — Telephone Encounter (Signed)
See result note.  

## 2021-12-16 NOTE — Telephone Encounter (Signed)
Patient's daughter called and stated the patient received her CMP on Friday 12/13/21 to see if she is able to take Tylenol. She would like Dr. Ranell Patrick to look at them to see if she is able. Her labs are in her chart

## 2021-12-16 NOTE — Telephone Encounter (Signed)
Pt daughter starr is calling and would like cmp results

## 2021-12-17 ENCOUNTER — Encounter: Payer: Medicare Other | Admitting: Vascular Surgery

## 2021-12-17 ENCOUNTER — Ambulatory Visit (INDEPENDENT_AMBULATORY_CARE_PROVIDER_SITE_OTHER): Payer: BC Managed Care – PPO | Admitting: Family Medicine

## 2021-12-17 ENCOUNTER — Other Ambulatory Visit: Payer: Self-pay

## 2021-12-17 ENCOUNTER — Encounter: Payer: Self-pay | Admitting: Family Medicine

## 2021-12-17 VITALS — BP 112/52 | HR 87 | Ht 60.0 in | Wt 122.0 lb

## 2021-12-17 DIAGNOSIS — M545 Low back pain, unspecified: Secondary | ICD-10-CM

## 2021-12-17 DIAGNOSIS — M546 Pain in thoracic spine: Secondary | ICD-10-CM

## 2021-12-17 MED ORDER — TIZANIDINE HCL 4 MG PO TABS: 4.0000 mg | ORAL_TABLET | Freq: Four times a day (QID) | ORAL | 1 refills | Status: DC | PRN

## 2021-12-17 NOTE — Patient Instructions (Addendum)
Nice to meet you today.  I've ordered 2 MRIs.  The MRI facility will call you to schedule but please let us know if you haven't heard from them in one week regarding scheduling.  I've referred you to PT at [facility]. They will call you to schedule but please let us know if you haven't heard from them by the end of the week regarding scheduling.  I've prescribed Tizanidine.  Use heat.  Follow-up: 2 weeks

## 2021-12-17 NOTE — Telephone Encounter (Signed)
Spoke with Dr. Ranell Patrick and she agreed to do a video visit for patient. Called daughter and informed her that it could be changed

## 2021-12-17 NOTE — Telephone Encounter (Signed)
Patient's daughter notified. She would like to know if her visit on 12/24/21 can be changed to video, especially if she does not need blood work

## 2021-12-17 NOTE — Progress Notes (Signed)
I, [name], LAT, ATC, am serving as scribe for Dr. [name].  Subjective:    CC: Low back pain  HPI: Pt is a 71 y/o female c/o low back pain x approximately one month after being hospitalized. Pt locates pain to her mid-back down to her buttocks.  Her pain is constant and describes her pain as burning.  She went to pain management last week.  She rates her pain as severe going up and down the midline of her spine from her lower lumbar spine to her mid thoracic spine.  Radiating pain: no LE numbness/tingling: feet feel cold/frozen but no numbness/tingling LE weakness: Aggravates: constant pain no matter what position Treatments tried: Oxycodone; Oxycontin; Tylenol; Biofreeze;  Dx imaging: 05/28/18 L hip/pelvis XR  Pertinent review of Systems: No fevers or chills  Relevant historical information: Aortic dissection and COPD.  AKI.   Objective:    Vitals:   12/17/21 1518  BP: (!) 112/52  Pulse: 87  SpO2: 96%   General: Well Developed, well nourished, and in no acute distress.   MSK:  T-spine: Slight kyphosis otherwise normal. Nontender midline. Mildly tender palpation paraspinal musculature. L-spine: Nontender midline. Tender palpation paraspinal musculature. Decreased lumbar motion. Lower extremity strength generally intact. Reflexes generally intact.  Of note patient has general muscle atrophy appearance upper and lower extremities. She arrives in a wheelchair.  Lab and Radiology Results  X-ray images of the thoracic spine and lumbar spine visible on CT scan of her chest abdomen and pelvis obtained on November 28, 2021 personally and independently interpreted  Thoracic spine: Degenerative changes mid thoracic spine with DDD.  No acute fractures are visible.  Lumbar spine: Mild facet DJD lower lumbar spine around L4-5 and L5-S1.  No acute fractures.    Impression and Recommendations:    Assessment and Plan: 71 y.o. female with  Severe mid thoracic  and lower lumbar back pain following a transcatheter aortic valve replacement and bypass grafting.  During the procedure she had loss of blood pressure and had chest compressions.   Since the procedure on January 18 she has had persistent back pain and had difficulty controlling her pain despite oxycodone.  I think at this point the source of her pain could be several different things.  I think that was likely examination as she has had perispinal or multifidi muscle dysfunction and spasm is the main source of pain.  I think physical therapy is her best option at this point.  Have referred to [facility] because she lives near there.  She may benefit from aquatic physical therapy.  Additionally use limited tizanidine at bedtime.  CT scan does not show compression fracture or other severe abnormality however she has enough pain that I am concerned she may have more abnormality than we can see on her spine.  We will proceed to MRI of her thoracic and lumbar spine now to fully evaluate the source of her pain and to determine future treatment plans and options.  She has an appointment with [facility] Ranell Patrick, Martha Clan MD) on March 7.   PDMP not reviewed this encounter. Orders Placed This Encounter  Procedures   MR THORACIC SPINE WO CONTRAST    Standing Status:   Future    Standing Expiration Date:   12/17/2022    Order Specific Question:   What is the patient's sedation requirement?    Answer:   No Sedation    Order Specific Question:   Does the patient have a pacemaker  or implanted devices?    Answer:   No    Order Specific Question:   Preferred imaging location?    Answer:   Product/process development scientist (table limit-350lbs)   MR Lumbar Spine Wo Contrast    Standing Status:   Future    Standing Expiration Date:   12/17/2022    Order Specific Question:   What is the patient's sedation requirement?    Answer:   No Sedation    Order Specific Question:   Does the patient have a pacemaker or implanted  devices?    Answer:   No    Order Specific Question:   Preferred imaging location?    Answer:   Product/process development scientist (table limit-350lbs)   Ambulatory referral to Physical Therapy    Referral Priority:   Urgent    Referral Type:   Physical Medicine    Referral Reason:   Specialty Services Required    Requested Specialty:   Physical Therapy    Number of Visits Requested:   1   Meds ordered this encounter  Medications   tiZANidine (ZANAFLEX) 4 MG tablet    Sig: Take 1 tablet (4 mg total) by mouth every 6 (six) hours as needed for muscle spasms.    Dispense:  30 tablet    Refill:  1    Discussed warning signs or symptoms. Please see discharge instructions. Patient expresses understanding.   The above documentation has been reviewed and is accurate and complete [name], M.D.

## 2021-12-18 ENCOUNTER — Telehealth: Payer: Self-pay | Admitting: Family Medicine

## 2021-12-18 DIAGNOSIS — E785 Hyperlipidemia, unspecified: Secondary | ICD-10-CM | POA: Diagnosis not present

## 2021-12-18 DIAGNOSIS — G40909 Epilepsy, unspecified, not intractable, without status epilepticus: Secondary | ICD-10-CM | POA: Diagnosis not present

## 2021-12-18 DIAGNOSIS — N3281 Overactive bladder: Secondary | ICD-10-CM | POA: Diagnosis not present

## 2021-12-18 DIAGNOSIS — Z95820 Peripheral vascular angioplasty status with implants and grafts: Secondary | ICD-10-CM | POA: Diagnosis not present

## 2021-12-18 DIAGNOSIS — Z79891 Long term (current) use of opiate analgesic: Secondary | ICD-10-CM | POA: Diagnosis not present

## 2021-12-18 DIAGNOSIS — F17210 Nicotine dependence, cigarettes, uncomplicated: Secondary | ICD-10-CM | POA: Diagnosis not present

## 2021-12-18 DIAGNOSIS — I10 Essential (primary) hypertension: Secondary | ICD-10-CM | POA: Diagnosis not present

## 2021-12-18 DIAGNOSIS — I7100 Dissection of unspecified site of aorta: Secondary | ICD-10-CM | POA: Diagnosis not present

## 2021-12-18 DIAGNOSIS — Z48812 Encounter for surgical aftercare following surgery on the circulatory system: Secondary | ICD-10-CM | POA: Diagnosis not present

## 2021-12-18 DIAGNOSIS — K219 Gastro-esophageal reflux disease without esophagitis: Secondary | ICD-10-CM | POA: Diagnosis not present

## 2021-12-18 DIAGNOSIS — J449 Chronic obstructive pulmonary disease, unspecified: Secondary | ICD-10-CM | POA: Diagnosis not present

## 2021-12-18 DIAGNOSIS — Z9181 History of falling: Secondary | ICD-10-CM | POA: Diagnosis not present

## 2021-12-18 DIAGNOSIS — K5900 Constipation, unspecified: Secondary | ICD-10-CM | POA: Diagnosis not present

## 2021-12-18 DIAGNOSIS — E0520 Thyrotoxicosis with toxic multinodular goiter without thyrotoxic crisis or storm: Secondary | ICD-10-CM | POA: Diagnosis not present

## 2021-12-18 DIAGNOSIS — I493 Ventricular premature depolarization: Secondary | ICD-10-CM | POA: Diagnosis not present

## 2021-12-18 DIAGNOSIS — R4189 Other symptoms and signs involving cognitive functions and awareness: Secondary | ICD-10-CM | POA: Diagnosis not present

## 2021-12-18 MED ORDER — LORAZEPAM 0.5 MG PO TABS: ORAL_TABLET | ORAL | 0 refills | Status: DC

## 2021-12-18 NOTE — Telephone Encounter (Signed)
Informed daughter [name] that Dr. Ranell Patrick will be calling her that day instead of a video visit. She asked that we call the mother's phone because she will be at work. Her number is in the chart under home phone for patient ?

## 2021-12-18 NOTE — Telephone Encounter (Signed)
Ativan prescribed for MRI anxiety. ?

## 2021-12-18 NOTE — Telephone Encounter (Signed)
Pt and daughter called, given phone # to schedule MRI as no pre cert required. ? ?Has PT questions. Dr. Georgina Snell ordered PT and Dr. Martinique ordered some sort of Home Health PT. THey are wondering if one will conflict with the other, or can [name] do both? ? ?If they have to chose, they would prefer to follow Dr. Clovis Riley orders. ?

## 2021-12-19 ENCOUNTER — Ambulatory Visit (INDEPENDENT_AMBULATORY_CARE_PROVIDER_SITE_OTHER): Payer: BC Managed Care – PPO

## 2021-12-19 VITALS — Ht 60.0 in | Wt 122.0 lb

## 2021-12-19 DIAGNOSIS — F17210 Nicotine dependence, cigarettes, uncomplicated: Secondary | ICD-10-CM | POA: Diagnosis not present

## 2021-12-19 DIAGNOSIS — J439 Emphysema, unspecified: Secondary | ICD-10-CM | POA: Diagnosis not present

## 2021-12-19 DIAGNOSIS — Z79891 Long term (current) use of opiate analgesic: Secondary | ICD-10-CM | POA: Diagnosis not present

## 2021-12-19 DIAGNOSIS — Z86010 Personal history of colonic polyps: Secondary | ICD-10-CM | POA: Diagnosis not present

## 2021-12-19 DIAGNOSIS — E7849 Other hyperlipidemia: Secondary | ICD-10-CM | POA: Diagnosis not present

## 2021-12-19 DIAGNOSIS — M545 Low back pain, unspecified: Secondary | ICD-10-CM | POA: Diagnosis not present

## 2021-12-19 DIAGNOSIS — I10 Essential (primary) hypertension: Secondary | ICD-10-CM | POA: Diagnosis not present

## 2021-12-19 DIAGNOSIS — E042 Nontoxic multinodular goiter: Secondary | ICD-10-CM | POA: Diagnosis not present

## 2021-12-19 DIAGNOSIS — Z0000 Encounter for general adult medical examination without abnormal findings: Secondary | ICD-10-CM | POA: Diagnosis not present

## 2021-12-19 DIAGNOSIS — Z48812 Encounter for surgical aftercare following surgery on the circulatory system: Secondary | ICD-10-CM | POA: Diagnosis not present

## 2021-12-19 DIAGNOSIS — E0590 Thyrotoxicosis, unspecified without thyrotoxic crisis or storm: Secondary | ICD-10-CM | POA: Diagnosis not present

## 2021-12-19 DIAGNOSIS — Z952 Presence of prosthetic heart valve: Secondary | ICD-10-CM | POA: Diagnosis not present

## 2021-12-19 DIAGNOSIS — G8929 Other chronic pain: Secondary | ICD-10-CM | POA: Diagnosis not present

## 2021-12-19 NOTE — Telephone Encounter (Signed)
They will conflict. ?If you are mobile and can get yourself to physical therapy office then home health physical therapy will not see you. ?If you cannot get to a physical therapy office then home health physical therapy will see you. ? ?The quality of physical therapy is typically going to be better in the physical therapy office than at home health physical therapy. ? ?What do you think you would like to do?  I would recommend if you can get to the physical therapy appointments you should go to the office-based physical therapy. ? ?Ellard Artis ?

## 2021-12-19 NOTE — Progress Notes (Signed)
Subjective:   Darlene Griffith is a 71 y.o. female who presents for Medicare Annual (Subsequent) preventive examination.  Review of Systems    Virtual Visit via Telephone Note  I connected with  Darlene Griffith on 12/19/21 at  3:30 PM EST by telephone and verified that I am speaking with the correct person using two identifiers.  Location: Patient: Home Provider: Office Persons participating in the virtual visit: patient/Nurse Health Advisor   I discussed the limitations, risks, security and privacy concerns of performing an evaluation and management service by telephone and the availability of in person appointments. The patient expressed understanding and agreed to proceed.  Interactive audio and video telecommunications were attempted between this nurse and patient, however failed, due to patient having technical difficulties OR patient did not have access to video capability.  We continued and completed visit with audio only.  Some vital signs may be absent or patient reported.   Criselda Peaches, LPN  Cardiac Risk Factors include: advanced age (>41men, >22 women);hypertension     Objective:    Today's Vitals   12/19/21 1540 12/19/21 1541  Weight: 122 lb (55.3 kg)   Height: 5' (1.524 m)   PainSc:  10-Worst pain ever   Body mass index is 23.83 kg/m.  Advanced Directives 12/19/2021 11/21/2021 11/07/2021 11/06/2021 08/17/2021 04/28/2021 10/26/2020  Does Patient Have a Medical Advance Directive? No No No No No No No  Would patient like information on creating a medical advance directive? No - Patient declined Yes (Inpatient - patient requests chaplain consult to create a medical advance directive) Yes (Inpatient - patient requests chaplain consult to create a medical advance directive) No - Patient declined - No - Patient declined No - Patient declined    Current Medications (verified) Outpatient Encounter Medications as of 12/19/2021  Medication Sig   acetaminophen (TYLENOL) 325 MG  tablet Take 1-2 tablets (325-650 mg total) by mouth every 4 (four) hours as needed for mild pain.   amLODipine (NORVASC) 10 MG tablet Take 1 tablet (10 mg total) by mouth daily.   ascorbic acid (VITAMIN C) 1000 MG tablet Take 1 tablet (1,000 mg total) by mouth daily.   atorvastatin (LIPITOR) 40 MG tablet Take 1 tablet (40 mg total) by mouth daily.   carvedilol (COREG) 25 MG tablet Take 1 tablet (25 mg total) by mouth 2 (two) times daily with a meal.   clotrimazole-betamethasone (LOTRISONE) cream Apply to affected area 2 times daily prn   diclofenac Sodium (VOLTAREN) 1 % GEL Apply 4 g topically 4 (four) times daily.   DULoxetine (CYMBALTA) 30 MG capsule Take 1 capsule (30 mg total) by mouth at bedtime.   LORazepam (ATIVAN) 0.5 MG tablet 1-2 tabs 30 - 60 min prior to MRI. Do not drive with this medicine.   losartan (COZAAR) 25 MG tablet Take 1 tablet (25 mg total) by mouth daily.   methimazole (TAPAZOLE) 5 MG tablet Take 0.5 tablets (2.5 mg total) by mouth 3 (three) times daily.   MYRBETRIQ 25 MG TB24 tablet Take 1 tablet by mouth once daily   nitroGLYCERIN (NITROSTAT) 0.4 MG SL tablet Place 1 tablet (0.4 mg total) under the tongue every 5 (five) minutes as needed for chest pain. May take up to 3 tabs in one hour and no more than 10 tablets in 24 hours.   nitroGLYCERIN (NITROSTAT) 0.4 MG SL tablet Place 1 tablet (0.4 mg total) under the tongue every 5 (five) minutes as needed for chest pain.  oxyCODONE (OXY IR/ROXICODONE) 5 MG immediate release tablet Take 1 tablet (5 mg total) by mouth every 4 (four) hours as needed for severe pain.   pantoprazole (PROTONIX) 40 MG tablet Take 1 tablet (40 mg total) by mouth at bedtime.   polyethylene glycol (MIRALAX / GLYCOLAX) 17 g packet Take 17 g by mouth daily as needed for mild constipation.   prochlorperazine (COMPAZINE) 5 MG tablet Take 1-2 tablets (5-10 mg total) by mouth every 6 (six) hours as needed for nausea.   senna (SENOKOT) 8.6 MG TABS tablet Take 2  tablets (17.2 mg total) by mouth at bedtime.   Tiotropium Bromide Monohydrate (SPIRIVA RESPIMAT) 2.5 MCG/ACT AERS Inhale 2 puffs into the lungs daily.   tiZANidine (ZANAFLEX) 4 MG tablet Take 1 tablet (4 mg total) by mouth every 6 (six) hours as needed for muscle spasms.   traZODone (DESYREL) 50 MG tablet Take 0.5-1 tablets (25-50 mg total) by mouth at bedtime as needed for sleep.   No facility-administered encounter medications on file as of 12/19/2021.    Allergies (verified) Shellfish allergy and Lidocaine   History: Past Medical History:  Diagnosis Date   Chest pain    a. 01/2015 Echo: Nl LV fxn, Gr 1 DD, triv AI, mild MR.   Essential hypertension    Family history of lung cancer    Hepatic cyst    a. noted on CT 01/2015.   Hyperthyroidism    GOING TO [facility] FOR SECOND OPINION   Multinodular goiter    a. 01/2015 CT chest: multinodular goidter w/ substernal extension of the left lobe of the thyroid assoc w/ rightward deviation of tracheal air column.   Neck pain, chronic    Personal history of colonic polyps    Pulmonary nodules    a. 01/2015 CT Chest: RLL ~ 36mm subpleural nodule - rec f/u in 6-12 mos.   Splenic cyst    a. noted on CT 01/2015.   Past Surgical History:  Procedure Laterality Date   BYPASS GRAFT AORTA TO AORTA Left 11/11/2021   Procedure: BYPASS GRAFT AORTA TO LEFT COMMON  FEMORAL ARTERY;  Surgeon: Cherre Robins, MD;  Location: [place];  Service: Vascular;  Laterality: Left;   CERVICAL DISC ARTHROPLASTY N/A 07/05/2019   Procedure: Cervical Six-Seven Artificial disc replacement;  Surgeon: Kristeen Miss, MD;  Location: [place];  Service: Neurosurgery;  Laterality: N/A;  Cervical Six-Seven Artificial disc replacement   CESAREAN SECTION     COLONOSCOPY  01/13/2020   THORACIC AORTIC ENDOVASCULAR STENT GRAFT N/A 11/11/2021   Procedure: THORACIC AORTIC ENDOVASCULAR STENT GRAFT WITH LEFT BRACHIAL  ARTERY ACCESS;  Surgeon: Cherre Robins, MD;  Location: [facility] OR;  Service: Vascular;   Laterality: N/A;   TONSILLECTOMY     AROUND 5-6 YRS OLD   ULTRASOUND GUIDANCE FOR VASCULAR ACCESS Bilateral 11/11/2021   Procedure: ULTRASOUND GUIDANCE FOR VASCULAR ACCESS;  Surgeon: Cherre Robins, MD;  Location: [facility] OR;  Service: Vascular;  Laterality: Bilateral;   UPPER GASTROINTESTINAL ENDOSCOPY  01/13/2020   Family History  Problem Relation Age of Onset   Heart attack Maternal Grandmother        deceased   High blood pressure Mother    High Cholesterol Mother    Thyroid disease Mother    Thyroid disease Sister    Lung cancer Father    Cancer Maternal Uncle        unk type   Cancer Sister        unk type  Colon cancer Neg Hx    Colon polyps Neg Hx    Esophageal cancer Neg Hx    Rectal cancer Neg Hx    Stomach cancer Neg Hx    Social History   Socioeconomic History   Marital status: Single    Spouse name: Not on file   Number of children: Not on file   Years of education: Not on file   Highest education level: Not on file  Occupational History   Occupation: retired  Tobacco Use   Smoking status: Light Smoker    Types: Cigarettes   Smokeless tobacco: Never   Tobacco comments:    1-2 NOT EVERY DAY   Vaping Use   Vaping Use: Never used  Substance and Sexual Activity   Alcohol use: Yes    Alcohol/week: 0.0 standard drinks    Comment: rare   Drug use: No   Sexual activity: Not Currently  Other Topics Concern   Not on file  Social History Narrative   Not on file   Social Determinants of Health   Financial Resource Strain: Low Risk    Difficulty of Paying Living Expenses: Not hard at all  Food Insecurity: No Food Insecurity   Worried About Charity fundraiser in the Last Year: Never true   [place]yard in the Last Year: Never true  Transportation Needs: No Transportation Needs   Lack of Transportation (Medical): No   Lack of Transportation (Non-Medical): No  Physical Activity: Inactive   Days of Exercise per Week: 0 days   Minutes of Exercise per  Session: 0 min  Stress: No Stress Concern Present   Feeling of Stress : Not at all  Social Connections: Moderately Integrated   Frequency of Communication with Friends and Family: More than three times a week   Frequency of Social Gatherings with Friends and Family: More than three times a week   Attends Religious Services: More than 4 times per year   Active Member of [employer] or Organizations: Yes   Attends Archivist Meetings: 1 to 4 times per year   Marital Status: Never married    Clinical Intake:  Pre-visit preparation completed: Yes  Diabetic?  No   Activities of Daily Living In your present state of health, do you have any difficulty performing the following activities: 12/19/2021 11/21/2021  Hearing? N N  Vision? N N  Difficulty concentrating or making decisions? N N  Walking or climbing stairs? N Y  Dressing or bathing? N N  Comment Daughter assist -  Doing errands, shopping? N N  Comment Daughter assist -  Conservation officer, nature and eating ? N -  Comment Daughter Assist -  Using the Toilet? N -  In the past six months, have you accidently leaked urine? N -  Do you have problems with loss of bowel control? N -  Managing your Medications? N -  Comment Daughter assist -  Managing your Finances? N -  Comment Daughter assist -  Housekeeping or managing your Housekeeping? N -  Comment Daughter assist -  Some recent data might be hidden    Patient Care Team: Martin[place], Betty G, MD as PCP - General (Family Medicine)  Indicate any recent Medical Services you may have received from other than [facility] providers in the past year (date may be approximate).     Assessment:   This is a routine wellness examination for D[place].  Hearing/Vision screen Hearing Screening - Comments:: No difficulty hearing Vision Screening -  Comments:: No vision difficulty.  Dietary issues and exercise activities discussed: Exercise limited by: None identified   Goals Addressed                This Visit's Progress     Patient Stated (pt-stated)        I would like peace in my life.       Depression Screen PHQ 2/9 Scores 12/19/2021 12/13/2021 12/12/2021 10/26/2020 12/04/2019 07/28/2018 11/13/2016  PHQ - 2 Score 0 6 6 0 0 0 3  PHQ- 9 Score 0 19 20 0 - - 9  Exception Documentation - - - - - - Other- indicate reason in comment box  Not completed - - - - - - ANNUAL SCREENING    Fall Risk Fall Risk  12/19/2021 12/12/2021 10/26/2020 05/20/2019 11/13/2016  Falls in the past year? 0 0 1 (No Data) Yes  Comment - - - Emmi Telephone Survey: data to providers prior to load -  Number falls in past yr: 0 0 1 (No Data) 1  Comment - - - Emmi Telephone Survey Actual Response =  -  Injury with Fall? 0 - 0 - No  Risk for fall due to : No Fall Risks - Impaired balance/gait - Other (Comment)  Follow up - - Falls evaluation completed;Falls prevention discussed - -    FALL RISK PREVENTION PERTAINING TO THE HOME:  Any stairs in or around the home? Yes  If so, are there any without handrails? No  Home free of loose throw rugs in walkways, pet beds, electrical cords, etc? Yes  Adequate lighting in your home to reduce risk of falls? Yes   ASSISTIVE DEVICES UTILIZED TO PREVENT FALLS:  Life alert? No  Use of a cane, walker or w/c? No  Grab bars in the bathroom? No  Shower chair or bench in shower? Yes  Elevated toilet seat or a handicapped toilet? Yes   TIMED UP AND GO:  Was the test performed? No . Audio Visit  Cognitive Function:      Immunizations Immunization History  Administered Date(s) Administered   Pneumococcal Conjugate-13 07/27/2018    Pneumococcal vaccine status: Declined,  Education has been provided regarding the importance of this vaccine but patient still declined. Advised may receive this vaccine at local pharmacy or Health Dept. Aware to provide a copy of the vaccination record if obtained from local pharmacy or Health Dept. Verbalized acceptance and understanding.    Covid-19 vaccine status: Declined, Education has been provided regarding the importance of this vaccine but patient still declined. Advised may receive this vaccine at local pharmacy or Health Dept.or vaccine clinic. Aware to provide a copy of the vaccination record if obtained from local pharmacy or Health Dept. Verbalized acceptance and understanding.  Qualifies for Shingles Vaccine? Yes   Zostavax completed No   Shingrix Completed?: No.    Education has been provided regarding the importance of this vaccine. Patient has been advised to call insurance company to determine out of pocket expense if they have not yet received this vaccine. Advised may also receive vaccine at local pharmacy or Health Dept. Verbalized acceptance and understanding.  Screening Tests Health Maintenance  Topic Date Due   COVID-19 Vaccine (1) 01/04/2022 (Originally 08/21/1951)   INFLUENZA VACCINE  01/17/2022 (Originally 05/20/2021)   Zoster Vaccines- Shingrix (1 of 2) 03/21/2022 (Originally 02/17/2001)   Pneumonia Vaccine 109+ Years old (2 - PPSV23 if available, else PCV20) 12/20/2022 (Originally 07/28/2019)   MAMMOGRAM  12/20/2022 (Originally 02/17/2001)  DEXA SCAN  12/20/2022 (Originally 02/18/2016)   COLONOSCOPY (Pts 45-7yrs Insurance coverage will need to be confirmed)  12/20/2022 (Originally 01/12/2021)   Hepatitis C Screening  Completed   HPV VACCINES  Aged Out   TETANUS/TDAP  Discontinued    Health Maintenance  There are no preventive care reminders to display for this patient.   Colorectal cancer screening: Referral to GI placed Patient deferred. Pt aware the office will call re: appt.Patient declined  Mammogram status: Ordered Patient deferred. Pt provided with contact info and advised to call to schedule appt. Patient declined  Bone Density status: Ordered Patient deferred. Pt provided with contact info and advised to call to schedule appt.Patient declined  Lung Cancer Screening: (Low Dose CT Chest  recommended if Age 8-80 years, 30 pack-year currently smoking OR have quit w/in 15years.) does not qualify.     Additional Screening:  Hepatitis C Screening: does qualify; Completed 12/16/19  Vision Screening: Recommended annual ophthalmology exams for early detection of glaucoma and other disorders of the eye. Is the patient up to date with their annual eye exam?  Patient deferred Who is the provider or what is the name of the office in which the patient attends annual eye exams? Patient deferred If pt is not established with a provider, would they like to be referred to a provider to establish care? No .   Dental Screening: Recommended annual dental exams for proper oral hygiene  Community Resource Referral / Chronic Care Management:  CRR required this visit?  No   CCM required this visit?  No      Plan:     I have personally reviewed and noted the following in the patients chart:   Medical and social history Use of alcohol, tobacco or illicit drugs  Current medications and supplements including opioid prescriptions.  Functional ability and status Nutritional status Physical activity Advanced directives List of other physicians Hospitalizations, surgeries, and ER visits in previous 12 months Vitals Screenings to include cognitive, depression, and falls Referrals and appointments  In addition, I have reviewed and discussed with patient certain preventive protocols, quality metrics, and best practice recommendations. A written personalized care plan for preventive services as well as general preventive health recommendations were provided to patient.     Criselda Peaches, LPN   06/22/2354   Nurse Notes: Patient has concerns of continued chronic lower back pain. Stop taking Rx meds  prescribed due to no relief. Patient has f/u appt with Dr Michiel Cowboy.

## 2021-12-19 NOTE — Telephone Encounter (Signed)
Called pt and her daughter and relayed Ativan rx info.  They verbalize understanding. ?

## 2021-12-19 NOTE — Patient Instructions (Addendum)
Darlene Griffith , Thank you for taking time to come for your Medicare Wellness Visit. I appreciate your ongoing commitment to your health goals. Please review the following plan we discussed and let me know if I can assist you in the future.   These are the goals we discussed:  Goals       Exercise 3x per week (30 min per time)      Patient Stated (pt-stated)      I would like peace in my life.        This is a list of the screening recommended for you and due dates:  Health Maintenance  Topic Date Due   COVID-19 Vaccine (1) 01/04/2022*   Flu Shot  01/17/2022*   Zoster (Shingles) Vaccine (1 of 2) 03/21/2022*   Pneumonia Vaccine (2 - PPSV23 if available, else PCV20) 12/20/2022*   Mammogram  12/20/2022*   DEXA scan (bone density measurement)  12/20/2022*   Colon Cancer Screening  12/20/2022*   Hepatitis C Screening: USPSTF Recommendation to screen - Ages 18-79 yo.  Completed   HPV Vaccine  Aged Out   Tetanus Vaccine  Discontinued  *Topic was postponed. The date shown is not the original due date.    Advanced directives: No Patient deferred  Conditions/risks identified: None  Next appointment: Follow up in one year for your annual wellness visit    Preventive Care 65 Years and Older, Female Preventive care refers to lifestyle choices and visits with your health care provider that can promote health and wellness. What does preventive care include? A yearly physical exam. This is also called an annual well check. Dental exams once or twice a year. Routine eye exams. Ask your health care provider how often you should have your eyes checked. Personal lifestyle choices, including: Daily care of your teeth and gums. Regular physical activity. Eating a healthy diet. Avoiding tobacco and drug use. Limiting alcohol use. Practicing safe sex. Taking low-dose aspirin every day. Taking vitamin and mineral supplements as recommended by your health care provider. What happens during an  annual well check? The services and screenings done by your health care provider during your annual well check will depend on your age, overall health, lifestyle risk factors, and family history of disease. Counseling  Your health care provider may ask you questions about your: Alcohol use. Tobacco use. Drug use. Emotional well-being. Home and relationship well-being. Sexual activity. Eating habits. History of falls. Memory and ability to understand (cognition). Work and work Statistician. Reproductive health. Screening  You may have the following tests or measurements: Height, weight, and BMI. Blood pressure. Lipid and cholesterol levels. These may be checked every 5 years, or more frequently if you are over 41 years old. Skin check. Lung cancer screening. You may have this screening every year starting at age 44 if you have a 30-pack-year history of smoking and currently smoke or have quit within the past 15 years. Fecal occult blood test (FOBT) of the stool. You may have this test every year starting at age 71. Flexible sigmoidoscopy or colonoscopy. You may have a sigmoidoscopy every 5 years or a colonoscopy every 10 years starting at age 2. Hepatitis C blood test. Hepatitis B blood test. Sexually transmitted disease (STD) testing. Diabetes screening. This is done by checking your blood sugar (glucose) after you have not eaten for a while (fasting). You may have this done every 1-3 years. Bone density scan. This is done to screen for osteoporosis. You may have this done starting  at age 77. Mammogram. This may be done every 1-2 years. Talk to your health care provider about how often you should have regular mammograms. Talk with your health care provider about your test results, treatment options, and if necessary, the need for more tests. Vaccines  Your health care provider may recommend certain vaccines, such as: Influenza vaccine. This is recommended every year. Tetanus,  diphtheria, and acellular pertussis (Tdap, Td) vaccine. You may need a Td booster every 10 years. Zoster vaccine. You may need this after age 8. Pneumococcal 13-valent conjugate (PCV13) vaccine. One dose is recommended after age 14. Pneumococcal polysaccharide (PPSV23) vaccine. One dose is recommended after age 31. Talk to your health care provider about which screenings and vaccines you need and how often you need them. This information is not intended to replace advice given to you by your health care provider. Make sure you discuss any questions you have with your health care provider. Document Released: 11/02/2015 Document Revised: 06/25/2016 Document Reviewed: 08/07/2015 Elsevier Interactive Patient Education  2017 [place] Prevention in the Home Falls can cause injuries. They can happen to people of all ages. There are many things you can do to make your home safe and to help prevent falls. What can I do on the outside of my home? Regularly fix the edges of walkways and driveways and fix any cracks. Remove anything that might make you trip as you walk through a door, such as a raised step or threshold. Trim any bushes or trees on the path to your home. Use bright outdoor lighting. Clear any walking paths of anything that might make someone trip, such as rocks or tools. Regularly check to see if handrails are loose or broken. Make sure that both sides of any steps have handrails. Any raised decks and porches should have guardrails on the edges. Have any leaves, snow, or ice cleared regularly. Use sand or salt on walking paths during winter. Clean up any spills in your garage right away. This includes oil or grease spills. What can I do in the bathroom? Use night lights. Install grab bars by the toilet and in the tub and shower. Do not use towel bars as grab bars. Use non-skid mats or decals in the tub or shower. If you need to sit down in the shower, use a plastic,  non-slip stool. Keep the floor dry. Clean up any water that spills on the floor as soon as it happens. Remove soap buildup in the tub or shower regularly. Attach bath mats securely with double-sided non-slip rug tape. Do not have throw rugs and other things on the floor that can make you trip. What can I do in the bedroom? Use night lights. Make sure that you have a light by your bed that is easy to reach. Do not use any sheets or blankets that are too big for your bed. They should not hang down onto the floor. Have a firm chair that has side arms. You can use this for support while you get dressed. Do not have throw rugs and other things on the floor that can make you trip. What can I do in the kitchen? Clean up any spills right away. Avoid walking on wet floors. Keep items that you use a lot in easy-to-reach places. If you need to reach something above you, use a strong step stool that has a grab bar. Keep electrical cords out of the way. Do not use floor polish or wax that makes  floors slippery. If you must use wax, use non-skid floor wax. Do not have throw rugs and other things on the floor that can make you trip. What can I do with my stairs? Do not leave any items on the stairs. Make sure that there are handrails on both sides of the stairs and use them. Fix handrails that are broken or loose. Make sure that handrails are as long as the stairways. Check any carpeting to make sure that it is firmly attached to the stairs. Fix any carpet that is loose or worn. Avoid having throw rugs at the top or bottom of the stairs. If you do have throw rugs, attach them to the floor with carpet tape. Make sure that you have a light switch at the top of the stairs and the bottom of the stairs. If you do not have them, ask someone to add them for you. What else can I do to help prevent falls? Wear shoes that: Do not have high heels. Have rubber bottoms. Are comfortable and fit you well. Are closed  at the toe. Do not wear sandals. If you use a stepladder: Make sure that it is fully opened. Do not climb a closed stepladder. Make sure that both sides of the stepladder are locked into place. Ask someone to hold it for you, if possible. Clearly mark and make sure that you can see: Any grab bars or handrails. First and last steps. Where the edge of each step is. Use tools that help you move around (mobility aids) if they are needed. These include: Canes. Walkers. Scooters. Crutches. Turn on the lights when you go into a dark area. Replace any light bulbs as soon as they burn out. Set up your furniture so you have a clear path. Avoid moving your furniture around. If any of your floors are uneven, fix them. If there are any pets around you, be aware of where they are. Review your medicines with your doctor. Some medicines can make you feel dizzy. This can increase your chance of falling. Ask your doctor what other things that you can do to help prevent falls. This information is not intended to replace advice given to you by your health care provider. Make sure you discuss any questions you have with your health care provider. Document Released: 08/02/2009 Document Revised: 03/13/2016 Document Reviewed: 11/10/2014 Elsevier Interactive Patient Education  2017 [employer].

## 2021-12-19 NOTE — Telephone Encounter (Signed)
Contacted pt and her daughter [name] and pt would like to proceed w/ outpatient PT which has already been ordered.  Advised them to contact home health PT and cancel that. ?

## 2021-12-20 ENCOUNTER — Telehealth: Payer: Self-pay | Admitting: Family Medicine

## 2021-12-20 DIAGNOSIS — Z48812 Encounter for surgical aftercare following surgery on the circulatory system: Secondary | ICD-10-CM | POA: Diagnosis not present

## 2021-12-20 DIAGNOSIS — E785 Hyperlipidemia, unspecified: Secondary | ICD-10-CM | POA: Diagnosis not present

## 2021-12-20 DIAGNOSIS — F17210 Nicotine dependence, cigarettes, uncomplicated: Secondary | ICD-10-CM | POA: Diagnosis not present

## 2021-12-20 DIAGNOSIS — Z95820 Peripheral vascular angioplasty status with implants and grafts: Secondary | ICD-10-CM

## 2021-12-20 DIAGNOSIS — E0520 Thyrotoxicosis with toxic multinodular goiter without thyrotoxic crisis or storm: Secondary | ICD-10-CM | POA: Diagnosis not present

## 2021-12-20 DIAGNOSIS — I10 Essential (primary) hypertension: Secondary | ICD-10-CM | POA: Diagnosis not present

## 2021-12-20 DIAGNOSIS — Z952 Presence of prosthetic heart valve: Secondary | ICD-10-CM | POA: Diagnosis not present

## 2021-12-20 DIAGNOSIS — R41841 Cognitive communication deficit: Secondary | ICD-10-CM

## 2021-12-20 DIAGNOSIS — Z9181 History of falling: Secondary | ICD-10-CM

## 2021-12-20 DIAGNOSIS — K219 Gastro-esophageal reflux disease without esophagitis: Secondary | ICD-10-CM | POA: Diagnosis not present

## 2021-12-20 DIAGNOSIS — E7849 Other hyperlipidemia: Secondary | ICD-10-CM | POA: Diagnosis not present

## 2021-12-20 DIAGNOSIS — R4189 Other symptoms and signs involving cognitive functions and awareness: Secondary | ICD-10-CM

## 2021-12-20 DIAGNOSIS — K5900 Constipation, unspecified: Secondary | ICD-10-CM | POA: Diagnosis not present

## 2021-12-20 DIAGNOSIS — J449 Chronic obstructive pulmonary disease, unspecified: Secondary | ICD-10-CM | POA: Diagnosis not present

## 2021-12-20 DIAGNOSIS — Z86010 Personal history of colonic polyps: Secondary | ICD-10-CM | POA: Diagnosis not present

## 2021-12-20 DIAGNOSIS — I7100 Dissection of unspecified site of aorta: Secondary | ICD-10-CM | POA: Diagnosis not present

## 2021-12-20 DIAGNOSIS — N3281 Overactive bladder: Secondary | ICD-10-CM | POA: Diagnosis not present

## 2021-12-20 DIAGNOSIS — I493 Ventricular premature depolarization: Secondary | ICD-10-CM | POA: Diagnosis not present

## 2021-12-20 DIAGNOSIS — E0590 Thyrotoxicosis, unspecified without thyrotoxic crisis or storm: Secondary | ICD-10-CM | POA: Diagnosis not present

## 2021-12-20 DIAGNOSIS — E042 Nontoxic multinodular goiter: Secondary | ICD-10-CM | POA: Diagnosis not present

## 2021-12-20 DIAGNOSIS — Z79891 Long term (current) use of opiate analgesic: Secondary | ICD-10-CM | POA: Diagnosis not present

## 2021-12-20 DIAGNOSIS — G8929 Other chronic pain: Secondary | ICD-10-CM | POA: Diagnosis not present

## 2021-12-20 DIAGNOSIS — M545 Low back pain, unspecified: Secondary | ICD-10-CM | POA: Diagnosis not present

## 2021-12-20 DIAGNOSIS — J439 Emphysema, unspecified: Secondary | ICD-10-CM | POA: Diagnosis not present

## 2021-12-20 DIAGNOSIS — G40909 Epilepsy, unspecified, not intractable, without status epilepticus: Secondary | ICD-10-CM | POA: Diagnosis not present

## 2021-12-20 NOTE — Telephone Encounter (Signed)
[name] [facility] is calling and needing verbal orders for skilled nursing 1x4 starting on 12-19-2021 ?

## 2021-12-20 NOTE — Telephone Encounter (Signed)
I left [name] a voicemail approving verbal orders.  ?

## 2021-12-21 ENCOUNTER — Other Ambulatory Visit: Payer: Self-pay

## 2021-12-21 ENCOUNTER — Ambulatory Visit (INDEPENDENT_AMBULATORY_CARE_PROVIDER_SITE_OTHER): Payer: BC Managed Care – PPO

## 2021-12-21 DIAGNOSIS — M545 Low back pain, unspecified: Secondary | ICD-10-CM

## 2021-12-21 DIAGNOSIS — M546 Pain in thoracic spine: Secondary | ICD-10-CM

## 2021-12-21 DIAGNOSIS — M5136 Other intervertebral disc degeneration, lumbar region: Secondary | ICD-10-CM | POA: Diagnosis not present

## 2021-12-23 ENCOUNTER — Telehealth: Payer: Self-pay

## 2021-12-23 NOTE — Progress Notes (Signed)
Thoracic spine MRI does show some mild arthritis changes.  But no severe changes are visible.  Plan for physical therapy.

## 2021-12-23 NOTE — Progress Notes (Signed)
Lumbar spine MRI just shows some medium arthritis changes at L4-L5.  It does not show any severe changes or fractures. ? ?Plan for physical therapy

## 2021-12-23 NOTE — Telephone Encounter (Signed)
Per DPR, spoke to pt's daughter and relayed Dr. Clovis Riley result notes. Pt's daughter expressed confusion, as pt has been in a lot of pain, and has been anticipating proceeding to [facility]. Pt's daughter was advised that based on the MRI findings, PT was the recommendation. Pt's daughter persisted and requested Dr. Clovis Riley additional input. Please advise. ?

## 2021-12-24 ENCOUNTER — Other Ambulatory Visit: Payer: Self-pay

## 2021-12-24 ENCOUNTER — Ambulatory Visit (INDEPENDENT_AMBULATORY_CARE_PROVIDER_SITE_OTHER): Payer: BC Managed Care – PPO | Admitting: Podiatry

## 2021-12-24 ENCOUNTER — Telehealth: Payer: Self-pay

## 2021-12-24 ENCOUNTER — Encounter: Payer: Self-pay | Admitting: Podiatry

## 2021-12-24 ENCOUNTER — Ambulatory Visit: Payer: BC Managed Care – PPO | Attending: Family Medicine | Admitting: Physical Therapy

## 2021-12-24 ENCOUNTER — Encounter: Payer: Self-pay | Admitting: Physical Therapy

## 2021-12-24 ENCOUNTER — Encounter
Payer: BC Managed Care – PPO | Attending: Physical Medicine and Rehabilitation | Admitting: Physical Medicine and Rehabilitation

## 2021-12-24 DIAGNOSIS — M546 Pain in thoracic spine: Secondary | ICD-10-CM | POA: Diagnosis not present

## 2021-12-24 DIAGNOSIS — M79675 Pain in left toe(s): Secondary | ICD-10-CM | POA: Diagnosis not present

## 2021-12-24 DIAGNOSIS — M545 Low back pain, unspecified: Secondary | ICD-10-CM | POA: Diagnosis not present

## 2021-12-24 DIAGNOSIS — G8929 Other chronic pain: Secondary | ICD-10-CM | POA: Insufficient documentation

## 2021-12-24 DIAGNOSIS — M5440 Lumbago with sciatica, unspecified side: Secondary | ICD-10-CM | POA: Diagnosis not present

## 2021-12-24 DIAGNOSIS — M62830 Muscle spasm of back: Secondary | ICD-10-CM | POA: Diagnosis not present

## 2021-12-24 DIAGNOSIS — M79674 Pain in right toe(s): Secondary | ICD-10-CM | POA: Diagnosis not present

## 2021-12-24 DIAGNOSIS — B351 Tinea unguium: Secondary | ICD-10-CM | POA: Diagnosis not present

## 2021-12-24 NOTE — Telephone Encounter (Signed)
Pt's daughter called back and was relayed Dr. Georgina Snell response regarding PT. ?

## 2021-12-24 NOTE — Telephone Encounter (Signed)
There are a few places we could do an injection in the back. We will talk about it in detail on 3/8. But the spine MRI looks pretty good. I think PT is the way to go.  ? ?

## 2021-12-24 NOTE — Progress Notes (Signed)
? ?  Subjective:  ? ? Patient ID: Darlene Griffith, female    DOB: 06/24/1951, 71 y.o.   MRN: [id number] ? ?HPI ?An audio/video tele-health visit is felt to be the most appropriate encounter for this patient at this time. This is a follow up tele-visit via phone. The patient is at home. MD is at office. Prior to scheduling this appointment, our staff discussed the limitations of evaluation and management by telemedicine and the availability of in-person appointments. The patient expressed understanding and agreed to proceed.  ? ?Darlene Griffith is a 71 year old woman presenting for hollow-up after CIR admission for debility. ? ?1) postoperative pain ?-she continues to have severe postoperative pain at home ?-at first she felt like the oxycodone was not helping but when she stopped if pain greatly increased so she realized it was helping. She is due to run out soon and needs a refill ?-She also needs a refill of nitroglycerin which is also helping ?-her daughter says [facility] needs records from [place] prior to her being able to see them for a second opinion. Dr. Roselie Awkward made initial referral ?-she has been able to get out of bed and ambulate which she is happy about.  ? ?2) low back pain ?-recently her pain has been worst in her lower back and she saw sports medicine ?-she was started on tizanidine and has been taking tylenol ?-she was referred to aquatic therapy and for epidural steroid injection ?-daughter says sometimes the pain is located in her lower back and sometimes in her legs ? ?3) decreased appetite at times ? ? ? ?Review of Systems ?+postoperative pain ?+low back pain ?+decreased appetite at times ?   ?Objective:  ? Physical Exam ?Not performed as patient was seen via phone ? ? ? ?   ?Assessment & Plan:  ? ?Darlene Griffith is a 71 year old woman presenting for hospital follow-up after CIR admission for debility. ? ?1) postoperative pain ?-sent refills of nitroglycerin, oxycodone ?-discussed with patient and messaged Judeen Hammans to  schedule her for hospital follow-up with Zella Ball at her next available, since the soonest in-person hospital follow-up for [place] together is in April.  ?-start gabapentin '300mg'$  TID- let me know if feeling sleepy with this ?-discussed risks of addiction, cognitive impairment, and constipation with oxycodone ?-recommended beets daily to improve vascular flow similarly to nitroglycerin ? ?2) insomnia: ?-discussed gabapentin at night should help. ?-if feeling too sleepy during the day with gabapentin, can double dose at night for better sleep ? ?3) Decreased appetite ?-discussed eating high protein/high nutritious foods when she is hungry to promote wound recover ?-discussed nutritious/high protein foods she can choose from ? ?3) Low back pain ?-commended decision to start aqua therapy ?-discussed that epidural steroid injections primarily help with leg pain ? ?5 minutes spent in discussion of her low back pain, commending decision to pursue aquatherapy and sharing my patients' positive experience with this, discussing that epidural steroid injections primarily help with leg pain ?

## 2021-12-24 NOTE — Progress Notes (Signed)
?  Subjective:  ?Patient ID: Darlene Griffith, female    DOB: 1951/09/10,  MRN: [id number] ? ?Chief Complaint  ?Patient presents with  ? Nail Problem  ?      np non diabetic nail trim/ left great toenail injured  ? ? ?71 y.o. female presents with the above complaint. History confirmed with patient.  She has previously had a permanent removal of the right great toenail which was successful.  The left is not successful in regrew thickened elongated and shaped like a claw.  The remainder of the nails are thickened elongated and painful as well.  She is currently dealing with severe back pain. ? ?Objective:  ?Physical Exam: ?warm, good capillary refill, no trophic changes or ulcerative lesions, normal DP and PT pulses, and normal sensory exam. ?Left Foot: dystrophic yellowed discolored nail plates with subungual debris and onychogryphosis left hallux with pincer nail deformity ?Right Foot: dystrophic yellowed discolored nail plates with subungual debris ? ?Assessment:  ? ?1. Pain due to onychomycosis of toenails of both feet   ? ? ? ?Plan:  ?Patient was evaluated and treated and all questions answered. ? ?Discussed the etiology and treatment options for the condition in detail with the patient. Educated patient on the topical and oral treatment options for mycotic nails. Recommended debridement of the nails today. Sharp and mechanical debridement performed of all painful and mycotic nails today. Nails debrided in length and thickness using a nail nipper to level of comfort. Discussed treatment options including appropriate shoe gear. Follow up as needed for painful nails. ? ?I discussed with her that we could consider total permanent nail avulsion of the left hallux nail.  She will consider this and return to see me as needed if she is interested in proceeding with this ? ?Return in about 3 months (around 03/26/2022) for RFC .  ? ?

## 2021-12-24 NOTE — Patient Instructions (Signed)
Access Code: [email or web address] ?URL: [email or web address] ?Date: 12/24/2021 ?Prepared by: Lum Babe ? ?Exercises ?Supine Posterior Pelvic Tilt - 2 x daily - 7 x weekly - 1 sets - 10 reps - 5 hold ?Hooklying Single Knee to Chest - 2 x daily - 7 x weekly - 1 sets - 10 reps - 5 hold ?Supine Lower Trunk Rotation - 2 x daily - 7 x weekly - 1 sets - 10 reps - 5 hold ? ?

## 2021-12-24 NOTE — Therapy (Signed)
[facility]. [place], [place], [address] Phone: [phone]   Fax:  [phone]  Physical Therapy Evaluation  Patient Details  Name: Darlene Griffith MRN: [id number] Date of Birth: 24-Dec-1950 Referring Provider (PT): Marikay Alar Date: 12/24/2021   PT End of Session - 12/24/21 1651     Visit Number 1    Number of Visits 77    Date for PT Re-Evaluation 03/26/22    Authorization Type BCBS    PT Start Time 1620    PT Stop Time 1705    PT Time Calculation (min) 45 min    Activity Tolerance Patient tolerated treatment well    Behavior During Therapy Agitated             Past Medical History:  Diagnosis Date   Chest pain    a. 01/2015 Echo: Nl LV fxn, Gr 1 DD, triv AI, mild MR.   Essential hypertension    Family history of lung cancer    Hepatic cyst    a. noted on CT 01/2015.   Hyperthyroidism    GOING TO [facility] FOR SECOND OPINION   Multinodular goiter    a. 01/2015 CT chest: multinodular goidter w/ substernal extension of the left lobe of the thyroid assoc w/ rightward deviation of tracheal air column.   Neck pain, chronic    Personal history of colonic polyps    Pulmonary nodules    a. 01/2015 CT Chest: RLL ~ 8m subpleural nodule - rec f/u in 6-12 mos.   Splenic cyst    a. noted on CT 01/2015.    Past Surgical History:  Procedure Laterality Date   BYPASS GRAFT AORTA TO AORTA Left 11/11/2021   Procedure: BYPASS GRAFT AORTA TO LEFT COMMON  FEMORAL ARTERY;  Surgeon: HCherre Robins MD;  [facility]  Service: Vascular;  Laterality: Left;   CERVICAL DISC ARTHROPLASTY N/A 07/05/2019   Procedure: Cervical Six-Seven Artificial disc replacement;  Surgeon: EKristeen Miss MD;  Location: [facility]  Service: Neurosurgery;  Laterality: N/A;  Cervical Six-Seven Artificial disc replacement   CESAREAN SECTION     COLONOSCOPY  01/13/2020   THORACIC AORTIC ENDOVASCULAR STENT GRAFT N/A 11/11/2021   Procedure: THORACIC  AORTIC ENDOVASCULAR STENT GRAFT WITH LEFT BRACHIAL  ARTERY ACCESS;  Surgeon: HCherre Robins MD;  [facility]  Service: Vascular;  Laterality: N/A;   TONSILLECTOMY     AROUND 5-6 YRS OLD   ULTRASOUND GUIDANCE FOR VASCULAR ACCESS Bilateral 11/11/2021   Procedure: ULTRASOUND GUIDANCE FOR VASCULAR ACCESS;  Surgeon: HCherre Robins MD;  [facility]  Service: Vascular;  Laterality: Bilateral;   UPPER GASTROINTESTINAL ENDOSCOPY  01/13/2020    There were no vitals filed for this visit.    Subjective Assessment - 12/24/21 1633     Subjective Patient reports that she underwent a heart surgery on 11/11/21, she reports that she was on the table much longer than planned and when she woke up she has been in pain ever since    Limitations Sitting;Lifting;Standing;Walking;House hold activities    Diagnostic tests arthritis, DDD    Patient Stated Goals have less pain    Currently in Pain? Yes    Pain Score 10-Worst pain ever    Pain Location Back    Pain Orientation Lower    Pain Descriptors / Indicators Aching;Sore;Spasm;Tightness    Pain Type Acute pain    Pain Onset More than a month ago  Pain Frequency Constant    Aggravating Factors  activity pain a 10/10    Pain Relieving Factors reports nothing has helped the pain, rates a 10/10 at all times    Effect of Pain on Daily Activities limits everything                [facility] PT Assessment - 12/24/21 0001       Assessment   Medical Diagnosis back pain    Referring Provider (PT) Georgina Snell    Onset Date/Surgical Date 11/11/21    Prior Therapy no      Precautions   Precautions None      Balance Screen   Has the patient fallen in the past 6 months No    Has the patient had a decrease in activity level because of a fear of falling?  No    Is the patient reluctant to leave their home because of a fear of falling?  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       [facility] Adult PT Treatment/Exercise - 12/24/21 0001       Modalities   Modalities Electrical Stimulation;Moist Heat      Moist Heat Therapy   Number Minutes Moist Heat 10 Minutes    Moist Heat Location Lumbar Spine      Electrical Stimulation   Electrical Stimulation Location T/l spine    Electrical Stimulation Action IFC    Electrical Stimulation Parameters supine    Electrical Stimulation Goals Pain                       PT Short Term Goals - 12/24/21 1703       PT SHORT TERM GOAL #1   Title independent with initial HEP    Time 2    Period Weeks    Status New               PT Long Term Goals - 12/24/21 1703       PT LONG TERM GOAL #1   Title understand posture and body mechanics    Time 12    Period Weeks    Status New      PT LONG TERM GOAL #2   Title report pain decreased 50%    Time 12    Period Weeks    Status New      PT LONG TERM GOAL #3   Title increase LE strength to 4/5    Time 12    Period Weeks    Status New      PT LONG TERM GOAL #4   Title be able to cook a meal    Time 12    Period Weeks    Status New                    Plan - 12/24/21 1652     Clinical Impression Statement Patient reports that after a heart surgery on 11/11/21 she woke up in significant pain, she reports that  she had to be given chest compressions multiple times, reports that she has hd this pain rated a 10/10  She is forward flexed and has to hold onto daughter to walk, she is limited in everything, reports nothing helps, unable to test strength due to pain, she will see MD tomorrow, I treated with heat and TENS, she reported good relief with this, I recommended one for home     Stability/Clinical Decision Making Evolving/Moderate complexity    Clinical Decision Making Low    Rehab Potential Good    PT Frequency 2x / week    PT Duration 12 weeks    PT Treatment/Interventions ADLs/Self Care Home Management;Cryotherapy;Electrical Stimulation;Moist Heat;Traction;Ultrasound;Gait training;Stair training;Functional mobility training;Therapeutic activities;Therapeutic exercise;Balance training;Neuromuscular re-education;Manual techniques;Dry needling;Taping    PT Next Visit Plan May need to instruct family in the use of TENS, try to get her moving some    Consulted and Agree with Plan of Care Patient             Patient will benefit from skilled therapeutic intervention in order to improve the following deficits and impairments:  Abnormal gait, Decreased range of motion, Difficulty walking, Increased muscle spasms, Pain, Decreased activity tolerance, Impaired flexibility, Improper body mechanics, Decreased mobility, Decreased strength, Postural dysfunction  Visit Diagnosis: Acute bilateral low back pain without sciatica - Plan: PT plan of care cert/re-cert  Pain in thoracic spine - Plan: PT plan of care cert/re-cert  Muscle spasm of back - Plan: PT plan of care cert/re-cert     Problem List Patient Active Problem List   Diagnosis Date Noted   COPD (chronic obstructive pulmonary disease) ([place]) 11/08/2021   AKI (acute kidney injury) ([place])    Aortic dissection ([place]) 11/06/2021   Gastroesophageal reflux disease without esophagitis    Hyperthyroidism 04/19/2021   Snoring 03/02/2020   Tobacco use 03/02/2020   Genetic testing 02/10/2020   Family history of lung cancer    Personal history of colonic polyps    Dysphagia 01/06/2020   Cervical spondylosis with myelopathy and radiculopathy 07/05/2019   Neck pain, chronic 05/21/2019   Insomnia 07/27/2018   Multinodular goiter 07/14/2018   Chronic fatigue 06/15/2018   Midsternal chest pain 02/18/2015   Chest  pain    Cough    Essential hypertension    Shortness of breath    Aortic stenosis    Pain in the chest    EKG abnormality    Malaise and fatigue    Lung nodule    Splenic cyst    Hepatic cyst    Hyperlipidemia    Hypertension 02/16/2015    Sumner Boast, PT 12/24/2021, 5:11 PM  [place]. [place], [place], [address] Phone: [phone]   Fax:  [phone]  Name: CINDE EBERT MRN: [id number] Date of Birth: 05-Nov-1950

## 2021-12-25 ENCOUNTER — Ambulatory Visit (INDEPENDENT_AMBULATORY_CARE_PROVIDER_SITE_OTHER): Payer: BC Managed Care – PPO | Admitting: Family Medicine

## 2021-12-25 ENCOUNTER — Other Ambulatory Visit: Payer: Self-pay

## 2021-12-25 VITALS — BP 112/80 | HR 92 | Ht 60.0 in

## 2021-12-25 DIAGNOSIS — M545 Low back pain, unspecified: Secondary | ICD-10-CM | POA: Diagnosis not present

## 2021-12-25 DIAGNOSIS — M546 Pain in thoracic spine: Secondary | ICD-10-CM | POA: Diagnosis not present

## 2021-12-25 MED ORDER — TRAZODONE HCL 50 MG PO TABS: 25.0000 mg | ORAL_TABLET | Freq: Every evening | ORAL | 0 refills | Status: DC | PRN

## 2021-12-25 MED ORDER — NITROGLYCERIN 0.4 MG SL SUBL: 0.4000 mg | SUBLINGUAL_TABLET | SUBLINGUAL | 0 refills | Status: DC | PRN

## 2021-12-25 MED ORDER — TIZANIDINE HCL 4 MG PO TABS: 4.0000 mg | ORAL_TABLET | Freq: Four times a day (QID) | ORAL | 1 refills | Status: DC | PRN

## 2021-12-25 MED ORDER — TRAZODONE HCL 50 MG PO TABS: 25.0000 mg | ORAL_TABLET | Freq: Every evening | ORAL | 0 refills | Status: AC | PRN

## 2021-12-25 MED ORDER — NITROGLYCERIN 0.4 MG SL SUBL: 0.4000 mg | SUBLINGUAL_TABLET | SUBLINGUAL | 12 refills | Status: DC | PRN

## 2021-12-25 NOTE — Patient Instructions (Addendum)
Thank you for coming in today.  ? ?Please call [facility] at [phone] or [phone] to schedule your spine injection.   ? ?Use that TENS unit  ? ?Heating pad ? ?TENS UNIT: ?This is helpful for muscle pain and spasm.  ? ? ? ?TENS unit instructions: Do not shower or bathe with the unit on ?Turn the unit off before removing electrodes or batteries ?If the electrodes lose stickiness add a drop of water to the electrodes after they are disconnected from the unit and place on plastic sheet. If you continued to have difficulty, call the TENS unit company to purchase more electrodes. ?Do not apply lotion on the skin area prior to use. Make sure the skin is clean and dry as this will help prolong the life of the electrodes. ?After use, always check skin for unusual red areas, rash or other skin difficulties. If there are any skin problems, does not apply electrodes to the same area. ?Never remove the electrodes from the unit by pulling the wires. ?Do not use the TENS unit or electrodes other than as directed. ?Do not change electrode placement without consultating your therapist or physician. ?Keep 2 fingers with between each electrode. ?Wear time ratio is 2:1, on to off times.   ? ?For example on for 30 minutes off for 15 minutes and then on for 30 minutes off for 15 minutes ? ?  ?

## 2021-12-25 NOTE — Progress Notes (Signed)
I, Peterson Lombard, LAT, ATC acting as a scribe for Darlene Leader, MD.  Darlene Griffith is a 71 y.o. female who presents to [place] [facility] today for f/u severe mid-thoracic and lower lumbar back pain following a transcatheter aortic valve replacement and bypass grafting.  During the procedure she had loss of blood pressure and had chest compressions. Pt was last seen by Dr. Georgina Snell on 12/17/21 and was referred to PT, completing 1 visit. Pt was also advised to proceed to MRI to fully evaluate the source of her pain. Pt also had a video-visit w/ Dr. Ranell Patrick on 12/24/21 and her nitroglycerin and oxycodone were refilled and prescribed gabapentin. Today, pt reports her back is feeling really bad. Pt c/o pain deep to the scapula on the L side. Pt notes this just started last night and woke her up in the middle of the night. Pt thinks she needs a refill of the trazadone, but was unsure if it was the trazadone or tizanidine.  In physical therapy yesterday she had e-stim and found that to be very helpful.  Her physical therapist recommended a TENS unit which she did purchase but does not know how to use it yet.  She did bring the TENS unit to clinic with her and would like some help with it.  She also is interested in perhaps proceeding with a back injection and has pain in her low back as noted above.  Additionally she is running out of her nitroglycerin sublingual.  Her pain management provider talked in her note yesterday about refilling the nitroglycerin but I do not see that it has been ordered yet.  Dx imaging: 12/21/21 L-spine & T-spine MRI 05/28/18 L hip/pelvis XR  Pertinent review of systems: No fevers or chills  Relevant historical information: Hypertension, aortic dissection   Exam:  BP 112/80    Pulse 92    Ht 5' (1.524 m)    SpO2 97%    BMI 23.83 kg/m  General: Well Developed, well nourished, and in no acute distress.   MSK: T-spine and L-spine: Nontender midline.   Tender palpation lumbar paraspinal musculature.    Lab and Radiology Results EXAM: MRI THORACIC AND LUMBAR SPINE WITHOUT CONTRAST   TECHNIQUE: Multiplanar and multiecho pulse sequences of the thoracic and lumbar spine were obtained without intravenous contrast.   COMPARISON:  None.   FINDINGS: MRI THORACIC SPINE FINDINGS   Alignment:  Physiologic.   Vertebrae: No fracture, evidence of discitis, or bone lesion. T3 hemangioma.   Cord:  Normal signal and morphology.   Paraspinal and other soft tissues: Incompletely thyroid goiter with mediastinal extension.   Disc levels:   No spinal canal stenosis. There are small disc protrusions at T5-6, T6-7, T7-8 and T8-9. Neural foramina are widely patent.   MRI LUMBAR SPINE FINDINGS   Segmentation:  Standard.   Alignment:  Physiologic.   Vertebrae:  No fracture, evidence of discitis, or bone lesion.   Conus medullaris and cauda equina: Conus extends to the L1 level. Conus and cauda equina appear normal.   Paraspinal and other soft tissues: Incompletely visualized aortic dissection.   Disc levels:   There is no spinal canal or neural foraminal stenosis. There is moderate facet arthrosis at L4-5.   IMPRESSION: 1. No acute abnormality of the thoracic or lumbar spine. 2. Mild thoracic degenerative disc disease without spinal canal or neural foraminal stenosis. 3. Incompletely visualized aortic dissection and thyroid goiter, both of which have been evaluated on  previous imaging.     Electronically Signed   By: Ulyses Jarred M.D.   On: 12/21/2021 22:39  EXAM: MRI THORACIC AND LUMBAR SPINE WITHOUT CONTRAST   TECHNIQUE: Multiplanar and multiecho pulse sequences of the thoracic and lumbar spine were obtained without intravenous contrast.   COMPARISON:  None.   FINDINGS: MRI THORACIC SPINE FINDINGS   Alignment:  Physiologic.   Vertebrae: No fracture, evidence of discitis, or bone lesion. T3 hemangioma.   Cord:   Normal signal and morphology.   Paraspinal and other soft tissues: Incompletely thyroid goiter with mediastinal extension.   Disc levels:   No spinal canal stenosis. There are small disc protrusions at T5-6, T6-7, T7-8 and T8-9. Neural foramina are widely patent.   MRI LUMBAR SPINE FINDINGS   Segmentation:  Standard.   Alignment:  Physiologic.   Vertebrae:  No fracture, evidence of discitis, or bone lesion.   Conus medullaris and cauda equina: Conus extends to the L1 level. Conus and cauda equina appear normal.   Paraspinal and other soft tissues: Incompletely visualized aortic dissection.   Disc levels:   There is no spinal canal or neural foraminal stenosis. There is moderate facet arthrosis at L4-5.   IMPRESSION: 1. No acute abnormality of the thoracic or lumbar spine. 2. Mild thoracic degenerative disc disease without spinal canal or neural foraminal stenosis. 3. Incompletely visualized aortic dissection and thyroid goiter, both of which have been evaluated on previous imaging.     Electronically Signed   By: Ulyses Jarred M.D.   On: 12/21/2021 22:39   I, Darlene Griffith, personally (independently) visualized and performed the interpretation of the images attached in this note.      Assessment and Plan: 71 y.o. female with chronic thoracic and low back pain.  Primarily I think the source of pain is the thoracic and lumbar paraspinal or multifidus muscle dysfunction and spasm.  She is just starting physical therapy which I think would help quite a lot.  TENS unit seems to be helpful we will continue that for now.  Core strengthening is going to be essential going forward.  ATC [place]) today got her TENS unit set up  As for her chronic axial low back pain she does have facet DJD primarily at L4-L5 which may be treatable with facet injection or medial branch block and ablation.  We will proceed with facet injection bilateral L4-L5 at [facility].  Additionally  she is starting to run out of her nitroglycerin sublingual tablets.  I will refill them today but this should really come from her primary care team.  And tizanidine for pain control especially at bedtime.  Recheck with me in 1 month or sooner if needed.   PDMP not reviewed this encounter. Orders Placed This Encounter  Procedures   DG FACET JT INJ L /S SINGLE LEVEL LEFT W/FL/CT    FACETS INJ BILAT L4-5 BCBS AND MCR PACS (12/21/21) 122 LBS *ASSA 325 MG* PT ALLERGIC TO LIDOCAINE    Standing Status:   Future    Standing Expiration Date:   12/26/2022    Order Specific Question:   Reason for Exam (SYMPTOM  OR DIAGNOSIS REQUIRED)    Answer:   BL L4-5    Order Specific Question:   Preferred Imaging Location?    Answer:   [facility]    Order Specific Question:   Radiology Contrast Protocol - do NOT remove file path    Answer:   \[email or web address]   DG FACET JT  INJ L /S SINGLE LEVEL RIGHT W/FL/CT    FACETS INJ BILAT L4-5 BCBS AND MCR PACS (12/21/21) 122 LBS *ASSA 325 MG* PT ALLERGIC TO LIDOCAINE    Standing Status:   Future    Standing Expiration Date:   12/26/2022    Order Specific Question:   Reason for Exam (SYMPTOM  OR DIAGNOSIS REQUIRED)    Answer:   BL L4-5    Order Specific Question:   Preferred Imaging Location?    Answer:   [facility]    Order Specific Question:   Radiology Contrast Protocol - do NOT remove file path    Answer:   [email or web address]   Meds ordered this encounter  Medications   DISCONTD: traZODone (DESYREL) 50 MG tablet    Sig: Take 0.5-1 tablets (25-50 mg total) by mouth at bedtime as needed for sleep.    Dispense:  20 tablet    Refill:  0   DISCONTD: tiZANidine (ZANAFLEX) 4 MG tablet    Sig: Take 1 tablet (4 mg total) by mouth every 6 (six) hours as needed for muscle spasms.    Dispense:  30 tablet    Refill:  1   nitroGLYCERIN (NITROSTAT) 0.4 MG SL tablet    Sig: Place 1  tablet (0.4 mg total) under the tongue every 5 (five) minutes as needed for chest pain. May take up to 3 tabs in one hour and no more than 10 tablets in 24 hours.    Dispense:  30 tablet    Refill:  0   nitroGLYCERIN (NITROSTAT) 0.4 MG SL tablet    Sig: Place 1 tablet (0.4 mg total) under the tongue every 5 (five) minutes as needed for chest pain.    Dispense:  30 tablet    Refill:  12   tiZANidine (ZANAFLEX) 4 MG tablet    Sig: Take 1 tablet (4 mg total) by mouth every 6 (six) hours as needed for muscle spasms.    Dispense:  30 tablet    Refill:  1   traZODone (DESYREL) 50 MG tablet    Sig: Take 0.5-1 tablets (25-50 mg total) by mouth at bedtime as needed for sleep.    Dispense:  20 tablet    Refill:  0     Discussed warning signs or symptoms. Please see discharge instructions. Patient expresses understanding.   The above documentation has been reviewed and is accurate and complete Darlene Griffith, M.D.  Total encounter time 30 minutes including face-to-face time with the patient and, reviewing past medical record, and charting on the date of service.   MRI review treatment planning and TENS unit teaching.

## 2021-12-30 ENCOUNTER — Ambulatory Visit
Admission: RE | Admit: 2021-12-30 | Discharge: 2021-12-30 | Disposition: A | Discharge status: Home / Self Care | Payer: BC Managed Care – PPO | Source: Ambulatory Visit | Attending: Family Medicine | Admitting: Family Medicine

## 2021-12-30 ENCOUNTER — Other Ambulatory Visit: Payer: Self-pay

## 2021-12-30 DIAGNOSIS — M549 Dorsalgia, unspecified: Secondary | ICD-10-CM | POA: Diagnosis not present

## 2021-12-30 DIAGNOSIS — M545 Low back pain, unspecified: Secondary | ICD-10-CM

## 2021-12-30 MED ORDER — IOPAMIDOL (ISOVUE-M 200) INJECTION 41%: 1.0000 mL | Freq: Once | INTRAMUSCULAR | Status: DC

## 2021-12-30 MED ORDER — METHYLPREDNISOLONE ACETATE 40 MG/ML INJ SUSP (RADIOLOG: 80.0000 mg | Freq: Once | INTRAMUSCULAR | Status: DC

## 2021-12-30 NOTE — Discharge Instructions (Signed)

## 2021-12-30 NOTE — Progress Notes (Unsigned)
[facility] [place] PROGRESS NOTE  ASSESSMENT / PLAN: TRIANA COOVER is a 71 y.o. female status post  :  1) bilateral ultrasound-guided common femoral artery access 2) left brachial artery exposure and percutaneous access 3) thoracic branched endovascular aortic repair (see below for device details) 4) left common and external iliac artery stenting (11 x 56m VBX x2) 5) left common iliac to common femoral artery bypass with 8 mm externally supported PTFE  For symptomatic type b aortic dissection having failed conservative management on 11/11/21. Her intraoperative course was complicated by iliac artery rupture requiring retroperitoneal exposure of the left iliac vessels and a left common femoral to common femoral bypass. Her postoperative course was marked by persistent pain and difficulty mobilizing. She otherwise did remarkably well. Follow up CT scan 11/28/21 showed remodeling of the aorta and patency of the bypass.    SUBJECTIVE: ***  OBJECTIVE: There were no vitals taken for this visit. '@INTAKEOUTPUTBRIEF'$ @  Urine output over past 24 hours: ***  Constitutional: *** appearing. *** acute distress. CNS: *** Cardiac: ***. Pulmonary: *** Abdomen: *** Vascular: ***  CBC Latest Ref Rng & Units 12/13/2021 11/28/2021 11/22/2021  WBC 4.0 - 10.5 K/uL 8.8 10.3 10.7(H)  Hemoglobin 12.0 - 15.0 g/dL 11.3(L) 10.8(L) 9.2(L)  Hematocrit 36.0 - 46.0 % 34.0(L) 33.2(L) 29.0(L)  Platelets 150.0 - 400.0 K/uL 439.0(H) 188 111(L)     CMP Latest Ref Rng & Units 12/13/2021 11/28/2021 11/22/2021  Glucose 70 - 99 mg/dL 86 111(H) 90  BUN 6 - 23 mg/dL '9 14 13  '$ Creatinine 0.40 - 1.20 mg/dL 0.42 0.48 0.47  Sodium 135 - 145 mEq/L 137 138 137  Potassium 3.5 - 5.1 mEq/L 3.6 4.1 3.8  Chloride 96 - 112 mEq/L 99 103 101  CO2 19 - 32 mEq/L 32 24 27  Calcium 8.4 - 10.5 mg/dL 10.3 9.6 8.6(L)  Total Protein 6.0 - 8.3 g/dL 8.3 - 6.0(L)  Total Bilirubin 0.2 - 1.2 mg/dL 0.3 - 0.6  Alkaline Phos 39 -  117 U/L 115 - 77  AST 0 - 37 U/L 16 - 53(H)  ALT 0 - 35 U/L 16 - 44    Estimated Creatinine Clearance: 51 mL/min (by C-G formula based on SCr of 0.42 mg/dL).  ***  TYevonne Aline HStanford Breed MD [facility] CenterPhone Number: [phone]/13/2023 9:12 PM

## 2021-12-31 ENCOUNTER — Encounter: Payer: Self-pay | Admitting: Vascular Surgery

## 2021-12-31 ENCOUNTER — Ambulatory Visit (INDEPENDENT_AMBULATORY_CARE_PROVIDER_SITE_OTHER): Payer: BC Managed Care – PPO | Admitting: Vascular Surgery

## 2021-12-31 VITALS — BP 118/74 | HR 100 | Temp 98.0°F | Resp 20 | Ht 60.0 in | Wt 122.0 lb

## 2021-12-31 DIAGNOSIS — Z9889 Other specified postprocedural states: Secondary | ICD-10-CM

## 2021-12-31 MED ORDER — LOSARTAN POTASSIUM 25 MG PO TABS: 25.0000 mg | ORAL_TABLET | Freq: Every day | ORAL | 0 refills | Status: DC

## 2021-12-31 MED ORDER — CARVEDILOL 25 MG PO TABS: 25.0000 mg | ORAL_TABLET | Freq: Two times a day (BID) | ORAL | 0 refills | Status: DC

## 2021-12-31 MED ORDER — ATORVASTATIN CALCIUM 40 MG PO TABS: 40.0000 mg | ORAL_TABLET | Freq: Every day | ORAL | 0 refills | Status: DC

## 2021-12-31 MED ORDER — AMLODIPINE BESYLATE 10 MG PO TABS: 10.0000 mg | ORAL_TABLET | Freq: Every day | ORAL | 0 refills | Status: DC

## 2022-01-02 ENCOUNTER — Telehealth: Payer: Self-pay

## 2022-01-02 ENCOUNTER — Encounter: Payer: Self-pay | Admitting: Physical Therapy

## 2022-01-02 ENCOUNTER — Other Ambulatory Visit: Payer: Self-pay

## 2022-01-02 ENCOUNTER — Ambulatory Visit: Payer: BC Managed Care – PPO | Admitting: Physical Therapy

## 2022-01-02 DIAGNOSIS — M62830 Muscle spasm of back: Secondary | ICD-10-CM

## 2022-01-02 DIAGNOSIS — I10 Essential (primary) hypertension: Secondary | ICD-10-CM | POA: Diagnosis not present

## 2022-01-02 DIAGNOSIS — E782 Mixed hyperlipidemia: Secondary | ICD-10-CM | POA: Diagnosis not present

## 2022-01-02 DIAGNOSIS — M546 Pain in thoracic spine: Secondary | ICD-10-CM | POA: Diagnosis not present

## 2022-01-02 DIAGNOSIS — M545 Low back pain, unspecified: Secondary | ICD-10-CM

## 2022-01-02 DIAGNOSIS — I71012 Dissection of descending thoracic aorta: Secondary | ICD-10-CM | POA: Diagnosis not present

## 2022-01-02 DIAGNOSIS — E0590 Thyrotoxicosis, unspecified without thyrotoxic crisis or storm: Secondary | ICD-10-CM | POA: Diagnosis not present

## 2022-01-02 DIAGNOSIS — I493 Ventricular premature depolarization: Secondary | ICD-10-CM | POA: Diagnosis not present

## 2022-01-02 MED ORDER — PANTOPRAZOLE SODIUM 40 MG PO TBEC: 40.0000 mg | DELAYED_RELEASE_TABLET | Freq: Every day | ORAL | 1 refills | Status: DC

## 2022-01-02 MED ORDER — DULOXETINE HCL 30 MG PO CPEP: 30.0000 mg | ORAL_CAPSULE | Freq: Every day | ORAL | 1 refills | Status: AC

## 2022-01-02 NOTE — Therapy (Signed)
[place] [address] ?[place], [place], [address] ?Phone: [phone]   Fax:  [phone] ? ?Physical Therapy Treatment ? ?Patient Details  ?Name: Darlene Griffith ?MRN: [id number] ?Date of Birth: [birth year] ?Referring Provider (PT): Georgina Snell ? ? ?Encounter Date: 01/02/2022 ? ? PT End of Session - 01/02/22 1646   ? ? Visit Number 2   ? Date for PT Re-Evaluation 03/26/22   ? Authorization Type BCBS   ? PT Start Time 1615   ? PT Stop Time 2330   ? PT Time Calculation (min) 49 min   ? Activity Tolerance Patient tolerated treatment well;Patient limited by pain   ? Behavior During Therapy [facility] for tasks assessed/performed   ? ?  ?  ? ?  ? ? ?Past Medical History:  ?Diagnosis Date  ? Chest pain   ? a. 01/2015 Echo: Nl LV fxn, Gr 1 DD, triv AI, mild MR.  ? Essential hypertension   ? Family history of lung cancer   ? Hepatic cyst   ? a. noted on CT 01/2015.  ? Hyperthyroidism   ? GOING TO [facility] FOR SECOND OPINION  ? Multinodular goiter   ? a. 01/2015 CT chest: multinodular goidter w/ substernal extension of the left lobe of the thyroid assoc w/ rightward deviation of tracheal air column.  ? Neck pain, chronic   ? Personal history of colonic polyps   ? Pulmonary nodules   ? a. 01/2015 CT Chest: RLL ~ 71m subpleural nodule - rec f/u in 6-12 mos.  ? Splenic cyst   ? a. noted on CT 01/2015.  ? ? ?Past Surgical History:  ?Procedure Laterality Date  ? BYPASS GRAFT AORTA TO AORTA Left 11/11/2021  ? Procedure: BYPASS GRAFT AORTA TO LEFT COMMON  FEMORAL ARTERY;  Surgeon: HCherre Robins MD;  [facility] [facility]  Service: Vascular;  Laterality: Left;  ? CERVICAL DISC ARTHROPLASTY N/A 07/05/2019  ? Procedure: Cervical Six-Seven Artificial disc replacement;  Surgeon: EKristeen Miss MD;  [place] [facility]  Service: Neurosurgery;  Laterality: N/A;  Cervical Six-Seven Artificial disc replacement  ? CESAREAN SECTION    ? COLONOSCOPY  01/13/2020  ? THORACIC AORTIC ENDOVASCULAR STENT GRAFT N/A 11/11/2021   ? Procedure: THORACIC AORTIC ENDOVASCULAR STENT GRAFT WITH LEFT BRACHIAL  ARTERY ACCESS;  Surgeon: HCherre Robins MD;  [facility]  Service: Vascular;  Laterality: N/A;  ? TONSILLECTOMY    ? AROUND 5-6 YRS OLD  ? ULTRASOUND GUIDANCE FOR VASCULAR ACCESS Bilateral 11/11/2021  ? Procedure: ULTRASOUND GUIDANCE FOR VASCULAR ACCESS;  Surgeon: HCherre Robins MD;  [facility]  Service: Vascular;  Laterality: Bilateral;  ? UPPER GASTROINTESTINAL ENDOSCOPY  01/13/2020  ? ? ?There were no vitals filed for this visit. ? ? Subjective Assessment - 01/02/22 1624   ? ? Subjective Patient reports that the last treatment that we did really helped, she reports that she had two injections in the low back Monday   ? Currently in Pain? Yes   ? Pain Score 8    ? Pain Location Back   ? Pain Orientation Lower   ? Pain Descriptors / Indicators Tightness;Spasm   ? Pain Relieving Factors the last treatment really helped   ? ?  ?  ? ?  ? ? ? ? ? ? ? ? ? ? ? ? ? ? ? ? ? ? ? ? [place]ercise - 01/02/22 0001   ? ?  ? Exercises  ?  Exercises Lumbar   ?  ? Lumbar Exercises: Supine  ? Pelvic Tilt 2 reps;10 reps   ? Clam 20 reps;1 second   ? Other Supine Lumbar Exercises supine hook lying ball b/n knees squeeze, tband scap stab   ? Other Supine Lumbar Exercises feet on ball K2c, small trunk rotaiton, small posteiror activation, isometric abs   ?  ? Moist Heat Therapy  ? Number Minutes Moist Heat 15 Minutes   ? Moist Heat Location Lumbar Spine   ?  ? Electrical Stimulation  ? Electrical Stimulation Location T/L spine   ? Electrical Stimulation Action IFC   ? Electrical Stimulation Parameters supine   ? Electrical Stimulation Goals Pain   ? ?  ?  ? ?  ? ? ? ? ? ? ? ? ? ? ? ? PT Short Term Goals - 01/02/22 1649   ? ?  ? PT SHORT TERM GOAL #1  ? Title independent with initial HEP   ? Status On-going   ? ?  ?  ? ?  ? ? ? ? PT Long Term Goals - 12/24/21 1703   ? ?  ? PT LONG TERM GOAL #1  ? Title understand posture and body  mechanics   ? Time 12   ? Period Weeks   ? Status New   ?  ? PT LONG TERM GOAL #2  ? Title report pain decreased 50%   ? Time 12   ? Period Weeks   ? Status New   ?  ? PT LONG TERM GOAL #3  ? Title increase LE strength to 4/5   ? Time 12   ? Period Weeks   ? Status New   ?  ? PT LONG TERM GOAL #4  ? Title be able to cook a meal   ? Time 12   ? Period Weeks   ? Status New   ? ?  ?  ? ?  ? ? ? ? ? ? ? ? Plan - 01/02/22 1646   ? ? Clinical Impression Statement Patient much improved movements and demeanor since the last visit, reports that she has looked forward to coming back, we started some supine exercises today, she tolerated well, had injections in the low back on Monday, now with a little more pain in the thoracic area, still with a lot of pain and difficulty but so much better than last week   ? PT Next Visit Plan work on movements and stability, had a lot of difficulty with engaging core   ? Consulted and Agree with Plan of Care Patient   ? ?  ?  ? ?  ? ? ?Patient will benefit from skilled therapeutic intervention in order to improve the following deficits and impairments:  Abnormal gait, Decreased range of motion, Difficulty walking, Increased muscle spasms, Pain, Decreased activity tolerance, Impaired flexibility, Improper body mechanics, Decreased mobility, Decreased strength, Postural dysfunction ? ?Visit Diagnosis: ?Acute bilateral low back pain without sciatica ? ?Pain in thoracic spine ? ?Muscle spasm of back ? ? ? ? ?Problem List ?Patient Active Problem List  ? Diagnosis Date Noted  ? COPD (chronic obstructive pulmonary disease) ([place]) 11/08/2021  ? AKI (acute kidney injury) ([place])   ? Aortic dissection ([place]) 11/06/2021  ? Gastroesophageal reflux disease without esophagitis   ? Hyperthyroidism 04/19/2021  ? Snoring 03/02/2020  ? Tobacco use 03/02/2020  ? Genetic testing 02/10/2020  ? Family history of lung cancer   ? Personal history of colonic  polyps   ? Dysphagia 01/06/2020  ? Cervical spondylosis with  myelopathy and radiculopathy 07/05/2019  ? Neck pain, chronic 05/21/2019  ? Insomnia 07/27/2018  ? Multinodular goiter 07/14/2018  ? Chronic fatigue 06/15/2018  ? Midsternal chest pain 02/18/2015  ? Chest pain   ? Cough   ? Essential hypertension   ? Shortness of breath   ? Aortic stenosis   ? Pain in the chest   ? EKG abnormality   ? Malaise and fatigue   ? Lung nodule   ? Splenic cyst   ? Hepatic cyst   ? Hyperlipidemia   ? Hypertension 02/16/2015  ? ? Sumner Boast, PT ?01/02/2022, 4:52 PM ? ? ?[place] [address] ?[place], [place], [address] ?Phone: [phone]   Fax:  [phone] ? ?Name: Darlene Griffith ?MRN: [id number] ?Date of Birth: 05/23/1951 ? ? ? ?

## 2022-01-02 NOTE — Telephone Encounter (Signed)
Refill request Duloxetine, Pantoprazole. Is this okay to refill? ?

## 2022-01-03 ENCOUNTER — Encounter: Payer: Self-pay | Admitting: Anesthesiology

## 2022-01-06 ENCOUNTER — Telehealth: Payer: Self-pay

## 2022-01-06 NOTE — Telephone Encounter (Signed)
Attempting a PA awaiting a reply  ?

## 2022-01-06 NOTE — Telephone Encounter (Signed)
PA done and it was approved and approval faxed to pharmacy  ?

## 2022-01-06 NOTE — Telephone Encounter (Signed)
Pharmacy called stating that patients insurance prefers baclofen over the tizanidine. Wondering if that is able to be sent in instead. Phone number [phone] ?

## 2022-01-07 ENCOUNTER — Ambulatory Visit: Payer: BC Managed Care – PPO | Admitting: Physical Therapy

## 2022-01-07 ENCOUNTER — Other Ambulatory Visit: Payer: Self-pay

## 2022-01-14 ENCOUNTER — Ambulatory Visit: Payer: BC Managed Care – PPO | Admitting: Physical Therapy

## 2022-01-14 ENCOUNTER — Other Ambulatory Visit: Payer: Self-pay

## 2022-01-14 ENCOUNTER — Encounter: Payer: Self-pay | Admitting: Physical Therapy

## 2022-01-14 DIAGNOSIS — M546 Pain in thoracic spine: Secondary | ICD-10-CM

## 2022-01-14 DIAGNOSIS — M545 Low back pain, unspecified: Secondary | ICD-10-CM

## 2022-01-14 DIAGNOSIS — Z9889 Other specified postprocedural states: Secondary | ICD-10-CM

## 2022-01-14 DIAGNOSIS — M62830 Muscle spasm of back: Secondary | ICD-10-CM

## 2022-01-14 NOTE — Therapy (Signed)
[place] ?[place] [address] ?[place], [place], [address] ?Phone: [phone]   Fax:  [phone] ? ?Physical Therapy Treatment ? ?Patient Details  ?Name: Darlene Griffith ?MRN: [id number] ?Date of Birth: 12-31-1950 ?Referring Provider (PT): Georgina Snell ? ? ?Encounter Date: 01/14/2022 ? ? PT End of Session - 01/14/22 1338   ? ? Visit Number 3   ? Date for PT Re-Evaluation 03/26/22   ? PT Stop Time 1350   ? Activity Tolerance Patient tolerated treatment well;Patient limited by pain   ? Behavior During Therapy [facility] for tasks assessed/performed   ? ?  ?  ? ?  ? ? ?Past Medical History:  ?Diagnosis Date  ? Chest pain   ? a. 01/2015 Echo: Nl LV fxn, Gr 1 DD, triv AI, mild MR.  ? Essential hypertension   ? Family history of lung cancer   ? Hepatic cyst   ? a. noted on CT 01/2015.  ? Hyperthyroidism   ? GOING TO [facility] FOR SECOND OPINION  ? Multinodular goiter   ? a. 01/2015 CT chest: multinodular goidter w/ substernal extension of the left lobe of the thyroid assoc w/ rightward deviation of tracheal air column.  ? Neck pain, chronic   ? Personal history of colonic polyps   ? Pulmonary nodules   ? a. 01/2015 CT Chest: RLL ~ 76m subpleural nodule - rec f/u in 6-12 mos.  ? Splenic cyst   ? a. noted on CT 01/2015.  ? ? ?Past Surgical History:  ?Procedure Laterality Date  ? BYPASS GRAFT AORTA TO AORTA Left 11/11/2021  ? Procedure: BYPASS GRAFT AORTA TO LEFT COMMON  FEMORAL ARTERY;  Surgeon: HCherre Robins MD;  [place] [facility]  Service: Vascular;  Laterality: Left;  ? CERVICAL DISC ARTHROPLASTY N/A 07/05/2019  ? Procedure: Cervical Six-Seven Artificial disc replacement;  Surgeon: EKristeen Miss MD;  [facility] [facility]  Service: Neurosurgery;  Laterality: N/A;  Cervical Six-Seven Artificial disc replacement  ? CESAREAN SECTION    ? COLONOSCOPY  01/13/2020  ? THORACIC AORTIC ENDOVASCULAR STENT GRAFT N/A 11/11/2021  ? Procedure: THORACIC AORTIC ENDOVASCULAR STENT GRAFT WITH LEFT BRACHIAL  ARTERY  ACCESS;  Surgeon: HCherre Robins MD;  [facility];  Service: Vascular;  Laterality: N/A;  ? TONSILLECTOMY    ? AROUND 5-6 YRS OLD  ? ULTRASOUND GUIDANCE FOR VASCULAR ACCESS Bilateral 11/11/2021  ? Procedure: ULTRASOUND GUIDANCE FOR VASCULAR ACCESS;  Surgeon: HCherre Robins MD;  [facility]  Service: Vascular;  Laterality: Bilateral;  ? UPPER GASTROINTESTINAL ENDOSCOPY  01/13/2020  ? ? ?There were no vitals filed for this visit. ? ? Subjective Assessment - 01/14/22 1300   ? ? Subjective "My back is giving me a fit" Increase pain after standing up for too long.   ? Limitations Sitting;Lifting;Standing;Walking;House hold activities   ? Currently in Pain? Yes   ? Pain Score 7    ? Pain Location Back   ? Pain Orientation Lower   ? ?  ?  ? ?  ? ? ? ? ? ? ? ? ? ? ? ? ? ? ? ? ? ? ? ? [place] PT Treatment/Exercise - 01/14/22 0001   ? ?  ? Lumbar Exercises: Aerobic  ? Nustep L3 x 4 min   ?  ? Lumbar Exercises: Standing  ? Shoulder Extension Strengthening;Both;20 reps;Theraband   ? Theraband Level (Shoulder Extension) Level 1 (Yellow)   ?  ? Lumbar Exercises: Seated  ? Sit  to Stand 5 reps   ? Other Seated Lumbar Exercises rows yellow 2x10   ?  ? Lumbar Exercises: Supine  ? Bridge Compliant;10 reps;2 seconds   ? Other Supine Lumbar Exercises feet on ball K2c, small trunk rotaiton, small posteiror activation, isometric abs   ?  ? Moist Heat Therapy  ? Number Minutes Moist Heat 15 Minutes   ? Moist Heat Location Lumbar Spine   ?  ? Electrical Stimulation  ? Electrical Stimulation Location T/L spine   ? Electrical Stimulation Action IFC   ? Electrical Stimulation Parameters supine   ? Electrical Stimulation Goals Pain   ? ?  ?  ? ?  ? ? ? ? ? ? ? ? ? ? ? ? PT Short Term Goals - 01/02/22 1649   ? ?  ? PT SHORT TERM GOAL #1  ? Title independent with initial HEP   ? Status On-going   ? ?  ?  ? ?  ? ? ? ? PT Long Term Goals - 12/24/21 1703   ? ?  ? PT LONG TERM GOAL #1  ? Title understand posture and body mechanics    ? Time 12   ? Period Weeks   ? Status New   ?  ? PT LONG TERM GOAL #2  ? Title report pain decreased 50%   ? Time 12   ? Period Weeks   ? Status New   ?  ? PT LONG TERM GOAL #3  ? Title increase LE strength to 4/5   ? Time 12   ? Period Weeks   ? Status New   ?  ? PT LONG TERM GOAL #4  ? Title be able to cook a meal   ? Time 12   ? Period Weeks   ? Status New   ? ?  ?  ? ?  ? ? ? ? ? ? ? ? Plan - 01/14/22 1339   ? ? Clinical Impression Statement Pt continues to report low back pain that increases after prolong standing. Added NuStep warm up and scapular strengthening under light resistance without issue. Good carryover form last session with supine exercises. She continues to report relief from E-Stim   ? Stability/Clinical Decision Making Evolving/Moderate complexity   ? Rehab Potential Good   ? PT Frequency 2x / week   ? PT Duration 12 weeks   ? PT Next Visit Plan work on movements and stability, had a lot of difficulty with engaging core   ? ?  ?  ? ?  ? ? ?Patient will benefit from skilled therapeutic intervention in order to improve the following deficits and impairments:  Abnormal gait, Decreased range of motion, Difficulty walking, Increased muscle spasms, Pain, Decreased activity tolerance, Impaired flexibility, Improper body mechanics, Decreased mobility, Decreased strength, Postural dysfunction ? ?Visit Diagnosis: ?Acute bilateral low back pain without sciatica ? ?Pain in thoracic spine ? ?Muscle spasm of back ? ? ? ? ?Problem List ?Patient Active Problem List  ? Diagnosis Date Noted  ? COPD (chronic obstructive pulmonary disease) ([place]) 11/08/2021  ? AKI (acute kidney injury) ([place])   ? Aortic dissection ([place]) 11/06/2021  ? Gastroesophageal reflux disease without esophagitis   ? Hyperthyroidism 04/19/2021  ? Snoring 03/02/2020  ? Tobacco use 03/02/2020  ? Genetic testing 02/10/2020  ? Family history of lung cancer   ? Personal history of colonic polyps   ? Dysphagia 01/06/2020  ? Cervical spondylosis with  myelopathy and radiculopathy 07/05/2019  ?  Neck pain, chronic 05/21/2019  ? Insomnia 07/27/2018  ? Multinodular goiter 07/14/2018  ? Chronic fatigue 06/15/2018  ? Midsternal chest pain 02/18/2015  ? Chest pain   ? Cough   ? Essential hypertension   ? Shortness of breath   ? Aortic stenosis   ? Pain in the chest   ? EKG abnormality   ? Malaise and fatigue   ? Lung nodule   ? Splenic cyst   ? Hepatic cyst   ? Hyperlipidemia   ? Hypertension 02/16/2015  ? ? ?Scot Jun, PTA ?01/14/2022, 1:41 PM ? ?[place] ?[address] ?[place], [place], [address] ?Phone: [phone]   Fax:  [phone] ? ?Name: TENISE STETLER ?MRN: [id number] ?Date of Birth: [birth year] ? ? ? ?

## 2022-01-20 NOTE — Progress Notes (Signed)
? ?I, Darlene Griffith, LAT, ATC, am serving as scribe for Dr. [name]. ? ?Darlene Griffith is a 71 y.o. female who presents to [place] [facility] today for f/u severe mid-thoracic and lower lumbar back pain following a transcatheter aortic valve replacement and bypass grafting.  During the procedure she had loss of blood pressure and had chest compressions.  She was last seen by Dr. Georgina Snell on 12/25/21 and noted no change in her symptoms.  She was encouraged to con't PT and has now completed 3 visits.  She was also referred for lumbar injections (B L4-5 facet injections on 12/30/21).  She was also prescribed tizanidine and trazodone.  Today, pt reports that the facet injections helped slightly but only decreased her pain from a 10/10 to an 8/10.  Last week her pain increased again and locates her pain to midline lower T-spine/upper L-spine.  Standing is the most aggravating activity.  ? ?She is currently scheduled to return to work on April 25.  Based on her back pain she does not think that that is a possibility.  Her current work restrictions and return to work are arranged by her [employer]. ? ?Dx imaging: 12/21/21 L-spine & T-spine MRI ?05/28/18 L hip/pelvis XR ?Pertinent review of systems: No fevers or chills ? ?Relevant historical information: COPD.  History of an aortic dissection. ? ? ?Exam:  ?BP 118/76 (BP Location: Left Arm, Patient Position: Sitting, Cuff Size: Normal)   Pulse 82   Ht 5' (1.524 m)   Wt 132 lb 3.2 oz (60 kg)   SpO2 96%   BMI 25.82 kg/m?  ?General: Well Developed, well nourished, and in no acute distress.  ? ?MSK: T-spine: Nontender midline.  Tender palpation bilateral thoracic paraspinal musculature.  Area of tenderness is around the mid part of the thoracic spine. ?Decreased thoracic motion. ?Pain with extension. ?L-spine nontender midline.  Decreased lumbar motion.  Pain with extension. ? ? ? ?Lab and Radiology Results ?EXAM: ?MRI THORACIC AND LUMBAR SPINE  WITHOUT CONTRAST ?  ?TECHNIQUE: ?Multiplanar and multiecho pulse sequences of the thoracic and lumbar ?spine were obtained without intravenous contrast. ?  ?COMPARISON:  None. ?  ?FINDINGS: ?MRI THORACIC SPINE FINDINGS ?  ?Alignment:  Physiologic. ?  ?Vertebrae: No fracture, evidence of discitis, or bone lesion. T3 ?hemangioma. ?  ?Cord:  Normal signal and morphology. ?  ?Paraspinal and other soft tissues: Incompletely thyroid goiter with ?mediastinal extension. ?  ?Disc levels: ?  ?No spinal canal stenosis. There are small disc protrusions at T5-6, ?T6-7, T7-8 and T8-9. Neural foramina are widely patent. ?  ?MRI LUMBAR SPINE FINDINGS ?  ?Segmentation:  Standard. ?  ?Alignment:  Physiologic. ?  ?Vertebrae:  No fracture, evidence of discitis, or bone lesion. ?  ?Conus medullaris and cauda equina: Conus extends to the L1 level. ?Conus and cauda equina appear normal. ?  ?Paraspinal and other soft tissues: Incompletely visualized aortic ?dissection. ?  ?Disc levels: ?  ?There is no spinal canal or neural foraminal stenosis. There is ?moderate facet arthrosis at L4-5. ?  ?IMPRESSION: ?1. No acute abnormality of the thoracic or lumbar spine. ?2. Mild thoracic degenerative disc disease without spinal canal or ?neural foraminal stenosis. ?3. Incompletely visualized aortic dissection and thyroid goiter, ?both of which have been evaluated on previous imaging. ?  ?  ?Electronically Signed ?  By: Ulyses Jarred M.D. ?  On: 12/21/2021 22:39 ?  ? ?EXAM: ?MRI THORACIC AND LUMBAR SPINE WITHOUT CONTRAST ?  ?TECHNIQUE: ?  Multiplanar and multiecho pulse sequences of the thoracic and lumbar ?spine were obtained without intravenous contrast. ?  ?COMPARISON:  None. ?  ?FINDINGS: ?MRI THORACIC SPINE FINDINGS ?  ?Alignment:  Physiologic. ?  ?Vertebrae: No fracture, evidence of discitis, or bone lesion. T3 ?hemangioma. ?  ?Cord:  Normal signal and morphology. ?  ?Paraspinal and other soft tissues: Incompletely thyroid goiter with ?mediastinal  extension. ?  ?Disc levels: ?  ?No spinal canal stenosis. There are small disc protrusions at T5-6, ?T6-7, T7-8 and T8-9. Neural foramina are widely patent. ?  ?MRI LUMBAR SPINE FINDINGS ?  ?Segmentation:  Standard. ?  ?Alignment:  Physiologic. ?  ?Vertebrae:  No fracture, evidence of discitis, or bone lesion. ?  ?Conus medullaris and cauda equina: Conus extends to the L1 level. ?Conus and cauda equina appear normal. ?  ?Paraspinal and other soft tissues: Incompletely visualized aortic ?dissection. ?  ?Disc levels: ?  ?There is no spinal canal or neural foraminal stenosis. There is ?moderate facet arthrosis at L4-5. ?  ?IMPRESSION: ?1. No acute abnormality of the thoracic or lumbar spine. ?2. Mild thoracic degenerative disc disease without spinal canal or ?neural foraminal stenosis. ?3. Incompletely visualized aortic dissection and thyroid goiter, ?both of which have been evaluated on previous imaging. ?  ?  ?Electronically Signed ?  By: Ulyses Jarred M.D. ?  On: 12/21/2021 22:39 ?  ? ?I, [name], personally (independently) visualized and performed the interpretation of the images attached in this note. ? ? ? ? ? ?Assessment and Plan: ?71 y.o. female with chronic thoracic and lumbar back pain.  This followed a prolonged hospitalization where she had an aortic dissection with complications. ?She has had significant pain that has been disabling.  She has had some improvement with physical therapy and lumbar facet injection at L4-5 bilaterally.  The majority of her pain now is concentrated in her mid thoracic area. ?She does think that her lumbar and thoracic pain is improving a bit with physical therapy but very slow to improve. ?After discussion I do think she may have some role for improvement with facet injection in her thoracic spine area.  I do see some degenerative changes in the mid thoracic spine on MRI myself.  Will refer to [facility] at Ortho care for potential thoracic facet injection. ?In the meantime she  should continue physical therapy.  We discussed that if she plateaus with conventional physical therapy she may benefit from aquatic PT. ? ?I do not think that she will be ready to return to work on April 25.  I think it is likely that her vascular surgeon will continue her medical leave but I will be happy to do so if that falls through. ? ? ? ? ?PDMP not reviewed this encounter. ?Orders Placed This Encounter  ?Procedures  ? Ambulatory referral to Physical Medicine Rehab  ?  Referral Priority:   Routine  ?  Referral Type:   Rehabilitation  ?  Referral Reason:   Specialty Services Required  ?  Referred to Provider:   Magnus Sinning, MD  ?  Requested Specialty:   Physical Medicine and Rehabilitation  ?  Number of Visits Requested:   1  ? ?No orders of the defined types were placed in this encounter. ? ? ? ?Discussed warning signs or symptoms. Please see discharge instructions. Patient expresses understanding. ? ? ?The above documentation has been reviewed and is accurate and complete [name], M.D. ? ? ?

## 2022-01-21 ENCOUNTER — Ambulatory Visit (INDEPENDENT_AMBULATORY_CARE_PROVIDER_SITE_OTHER): Payer: BC Managed Care – PPO | Admitting: Family Medicine

## 2022-01-21 ENCOUNTER — Encounter: Payer: Self-pay | Admitting: Family Medicine

## 2022-01-21 VITALS — BP 118/76 | HR 82 | Ht 60.0 in | Wt 132.2 lb

## 2022-01-21 DIAGNOSIS — M545 Low back pain, unspecified: Secondary | ICD-10-CM

## 2022-01-21 DIAGNOSIS — G8929 Other chronic pain: Secondary | ICD-10-CM | POA: Diagnosis not present

## 2022-01-21 DIAGNOSIS — M546 Pain in thoracic spine: Secondary | ICD-10-CM

## 2022-01-21 NOTE — Patient Instructions (Addendum)
Good to see you today. ? ?I've referred you to Dr. Ernestina Patches for thoracic facet injections. Their office will call you to schedule but please let us know if you don't hear from them in one week. ? ?Follow-up: as needed ?

## 2022-01-23 ENCOUNTER — Ambulatory Visit: Payer: BC Managed Care – PPO | Attending: Family Medicine | Admitting: Physical Therapy

## 2022-01-23 DIAGNOSIS — M546 Pain in thoracic spine: Secondary | ICD-10-CM | POA: Diagnosis not present

## 2022-01-23 DIAGNOSIS — M545 Low back pain, unspecified: Secondary | ICD-10-CM | POA: Insufficient documentation

## 2022-01-23 DIAGNOSIS — M62830 Muscle spasm of back: Secondary | ICD-10-CM | POA: Insufficient documentation

## 2022-01-23 NOTE — Therapy (Signed)
[place] ?[address] ?[place], [place], [address] ?Phone: [phone]   Fax:  [phone] ? ?Physical Therapy Treatment ? ?Patient Details  ?Name: Darlene Griffith ?MRN: [id number] ?Date of Birth: 07/29/1951 ?Referring Provider (PT): Georgina Snell ? ? ?Encounter Date: 01/23/2022 ? ? PT End of Session - 01/23/22 1744   ? ? Visit Number 4   ? Date for PT Re-Evaluation 03/26/22   ? Authorization Type BCBS   ? PT Start Time 1703   ? PT Stop Time 1750   ? PT Time Calculation (min) 47 min   ? ?  ?  ? ?  ? ? ?Past Medical History:  ?Diagnosis Date  ? Chest pain   ? a. 01/2015 Echo: Nl LV fxn, Gr 1 DD, triv AI, mild MR.  ? Essential hypertension   ? Family history of lung cancer   ? Hepatic cyst   ? a. noted on CT 01/2015.  ? Hyperthyroidism   ? GOING TO [facility] FOR SECOND OPINION  ? Multinodular goiter   ? a. 01/2015 CT chest: multinodular goidter w/ substernal extension of the left lobe of the thyroid assoc w/ rightward deviation of tracheal air column.  ? Neck pain, chronic   ? Personal history of colonic polyps   ? Pulmonary nodules   ? a. 01/2015 CT Chest: RLL ~ 72m subpleural nodule - rec f/u in 6-12 mos.  ? Splenic cyst   ? a. noted on CT 01/2015.  ? ? ?Past Surgical History:  ?Procedure Laterality Date  ? BYPASS GRAFT AORTA TO AORTA Left 11/11/2021  ? Procedure: BYPASS GRAFT AORTA TO LEFT COMMON  FEMORAL ARTERY;  Surgeon: HCherre Robins MD;  [facility]  Service: Vascular;  Laterality: Left;  ? CERVICAL DISC ARTHROPLASTY N/A 07/05/2019  ? Procedure: Cervical Six-Seven Artificial disc replacement;  Surgeon: EKristeen Miss MD;  [facility]  Service: Neurosurgery;  Laterality: N/A;  Cervical Six-Seven Artificial disc replacement  ? CESAREAN SECTION    ? COLONOSCOPY  01/13/2020  ? THORACIC AORTIC ENDOVASCULAR STENT GRAFT N/A 11/11/2021  ? Procedure: THORACIC AORTIC ENDOVASCULAR STENT GRAFT WITH LEFT BRACHIAL  ARTERY ACCESS;  Surgeon: HCherre Robins MD;  [facility];   Service: Vascular;  Laterality: N/A;  ? TONSILLECTOMY    ? AROUND 71 YRS OLD  ? ULTRASOUND GUIDANCE FOR VASCULAR ACCESS Bilateral 11/11/2021  ? Procedure: ULTRASOUND GUIDANCE FOR VASCULAR ACCESS;  Surgeon: HCherre Robins MD;  [facility];  Service: Vascular;  Laterality: Bilateral;  ? UPPER GASTROINTESTINAL ENDOSCOPY  01/13/2020  ? ? ?There were no vitals filed for this visit. ? ? Subjective Assessment - 01/23/22 1714   ? ? Subjective walking in from parking lot increased pain. combed hair for first time and that was diffiuclt. MD wanted me to ask about water therapy.definately want TENS today   ? Currently in Pain? Yes   ? Pain Score 9    ? Pain Location Back   ? ?  ?  ? ?  ? ? ? ? ? ? ? ? ? ? ? ? ? ? ? ? ? ? ? ? [facility] - 01/23/22 0001   ? ?  ? Lumbar Exercises: Standing  ? Other Standing Lumbar Exercises HHA standing marching , hip ext and abd 10 x   ?  ? Lumbar Exercises: Seated  ? Sit to Stand 5 reps   ? Other Seated Lumbar Exercises rows yellow 2x10  2# chest press and OH 8 x each  ? Other Seated Lumbar Exercises pelvic ROM 4 way 10 x on dyna disc   LAQ 10 x. isometric abd with ball  ?  ? Lumbar Exercises: Supine  ? Other Supine Lumbar Exercises attempted supine ex but increased pain so had to get up off back   ?  ? Moist Heat Therapy  ? Number Minutes Moist Heat 10 Minutes   ? Moist Heat Location Lumbar Spine   ?  ? Electrical Stimulation  ? Electrical Stimulation Location T/L spine   ? Electrical Stimulation Action IFC   ? Electrical Stimulation Parameters supine   ? Electrical Stimulation Goals Pain   ? ?  ?  ? ?  ? ? ? ? ? ? ? ? ? ? ? ? PT Short Term Goals - 01/23/22 1718   ? ?  ? PT SHORT TERM GOAL #1  ? Title independent with initial HEP   ? Status Partially Met   ? ?  ?  ? ?  ? ? ? ? PT Long Term Goals - 01/23/22 1718   ? ?  ? PT LONG TERM GOAL #1  ? Title understand posture and body mechanics   ? Baseline needs cuing   ? Status Partially Met   ?  ? PT LONG TERM GOAL #2   ? Title report pain decreased 50%   ? Status On-going   ?  ? PT LONG TERM GOAL #3  ? Title increase LE strength to 4/5   ? Status On-going   ?  ? PT LONG TERM GOAL #4  ? Title be able to cook a meal   ? Baseline stand 1-2 min then pain increases   ? Status On-going   ? ?  ?  ? ?  ? ? ? ? ? ? ? ? Plan - 01/23/22 1745   ? ? Clinical Impression Statement pt arrived with increased pain from doing hair for first time and walking in 9/10, attempted supine ex but pain limited her to lye down for ex. tolerated seated and standing ex fair with cuing. pt postue is fair and cautioned with quick and rotational mvmt ie reaching to floor to clean something form purse.pt sttaes sh ehas TENS unit at home but it is not as strong as ours. pt inquired about water therapy and stated she needs to check hours. pain after 5/10   ? PT Treatment/Interventions ADLs/Self Care Home Management;Cryotherapy;Electrical Stimulation;Moist Heat;Traction;Ultrasound;Gait training;Stair training;Functional mobility training;Therapeutic activities;Therapeutic exercise;Balance training;Neuromuscular re-education;Manual techniques;Dry needling;Taping   ? PT Next Visit Plan may need help navigating water therapy at [place]   ? ?  ?  ? ?  ? ? ?Patient will benefit from skilled therapeutic intervention in order to improve the following deficits and impairments:  Abnormal gait, Decreased range of motion, Difficulty walking, Increased muscle spasms, Pain, Decreased activity tolerance, Impaired flexibility, Improper body mechanics, Decreased mobility, Decreased strength, Postural dysfunction ? ?Visit Diagnosis: ?Acute bilateral low back pain without sciatica ? ?Pain in thoracic spine ? ?Muscle spasm of back ? ? ? ? ?Problem List ?Patient Active Problem List  ? Diagnosis Date Noted  ? COPD (chronic obstructive pulmonary disease) ([facility]) 11/08/2021  ? AKI (acute kidney injury) ([place])   ? Aortic dissection ([place]) 11/06/2021  ? Gastroesophageal reflux disease  without esophagitis   ? Hyperthyroidism 04/19/2021  ? Snoring 03/02/2020  ? Tobacco use 03/02/2020  ? Genetic testing 02/10/2020  ? Family history of lung cancer   ?  Personal history of colonic polyps   ? Dysphagia 01/06/2020  ? Cervical spondylosis with myelopathy and radiculopathy 07/05/2019  ? Neck pain, chronic 05/21/2019  ? Insomnia 07/27/2018  ? Multinodular goiter 07/14/2018  ? Chronic fatigue 06/15/2018  ? Midsternal chest pain 02/18/2015  ? Chest pain   ? Cough   ? Essential hypertension   ? Shortness of breath   ? Aortic stenosis   ? Pain in the chest   ? EKG abnormality   ? Malaise and fatigue   ? Lung nodule   ? Splenic cyst   ? Hepatic cyst   ? Hyperlipidemia   ? Hypertension 02/16/2015  ? ? ?Ayen Viviano,ANGIE, PTA ?01/23/2022, 5:52 PM ? ?[place] ?[place] [address] ?[place], [place], [address] ?Phone: [phone]   Fax:  [phone] ? ?Name: CARREN BLAKLEY ?MRN: [id number] ?Date of Birth: [birth year] ? ? ? ?

## 2022-01-27 ENCOUNTER — Other Ambulatory Visit: Payer: Self-pay | Admitting: Family Medicine

## 2022-01-27 ENCOUNTER — Ambulatory Visit (HOSPITAL_COMMUNITY)
Admission: RE | Admit: 2022-01-27 | Discharge: 2022-01-27 | Disposition: A | Discharge status: Home / Self Care | Payer: BC Managed Care – PPO | Source: Ambulatory Visit | Attending: Vascular Surgery | Admitting: Vascular Surgery

## 2022-01-27 ENCOUNTER — Other Ambulatory Visit: Payer: Self-pay | Admitting: Vascular Surgery

## 2022-01-27 DIAGNOSIS — Z9889 Other specified postprocedural states: Secondary | ICD-10-CM | POA: Insufficient documentation

## 2022-01-27 DIAGNOSIS — K7689 Other specified diseases of liver: Secondary | ICD-10-CM | POA: Diagnosis not present

## 2022-01-27 DIAGNOSIS — I700 Atherosclerosis of aorta: Secondary | ICD-10-CM | POA: Diagnosis not present

## 2022-01-27 DIAGNOSIS — E0590 Thyrotoxicosis, unspecified without thyrotoxic crisis or storm: Secondary | ICD-10-CM

## 2022-01-27 DIAGNOSIS — I71019 Dissection of thoracic aorta, unspecified: Secondary | ICD-10-CM | POA: Diagnosis not present

## 2022-01-27 MED ORDER — IOHEXOL 350 MG/ML SOLN
100.0000 mL | Freq: Once | INTRAVENOUS | Status: AC | PRN
  Administered 2022-01-27: 100 mL via INTRAVENOUS

## 2022-01-28 ENCOUNTER — Other Ambulatory Visit: Payer: Self-pay | Admitting: Physical Therapy

## 2022-01-28 ENCOUNTER — Telehealth: Payer: Self-pay | Admitting: Family Medicine

## 2022-01-28 MED ORDER — TIZANIDINE HCL 4 MG PO TABS: 4.0000 mg | ORAL_TABLET | Freq: Four times a day (QID) | ORAL | 1 refills | Status: DC | PRN

## 2022-01-28 NOTE — Telephone Encounter (Signed)
Tizanidine refilled

## 2022-01-28 NOTE — Telephone Encounter (Signed)
Patient's daughter called requesting a refill on tiZANidine (ZANAFLEX) 4 MG tablet.  ?

## 2022-01-29 ENCOUNTER — Telehealth: Payer: Self-pay | Admitting: Family Medicine

## 2022-01-29 NOTE — Telephone Encounter (Signed)
Pt wanted Dr. Georgina Snell to know that she had her CT done 4/10 at [facility] ( ordered by another MD ). ?

## 2022-01-30 ENCOUNTER — Ambulatory Visit: Payer: BC Managed Care – PPO | Admitting: Physical Therapy

## 2022-01-30 NOTE — Telephone Encounter (Signed)
Called pt.  No answer and unable to LM due to pt's VM being full.  If pt returns call, please relay Dr. Clovis Riley message to con't PT and schedule w/ Dr. Ernestina Patches for likely mid-back/thoracic spine facet injections. ?

## 2022-01-30 NOTE — Telephone Encounter (Signed)
Thank you for the update.  I looked at the images myself.  In your spine and the thoracic mid back (about where the bra strap sits perhaps a little lower) you do have some area of arthritis changes.  We have seen this previously.  I think this is probably your source of pain. ?Dr. Ernestina Patches could theoretically do an injection in this area. ?Please keep working with physical therapy. ? ?Your vascular surgeon did a great job to keep you healthy. ?

## 2022-01-31 ENCOUNTER — Ambulatory Visit: Payer: Medicare Other | Admitting: Family Medicine

## 2022-01-31 ENCOUNTER — Other Ambulatory Visit: Payer: BC Managed Care – PPO

## 2022-01-31 ENCOUNTER — Other Ambulatory Visit: Payer: Self-pay

## 2022-01-31 DIAGNOSIS — E0590 Thyrotoxicosis, unspecified without thyrotoxic crisis or storm: Secondary | ICD-10-CM

## 2022-01-31 MED ORDER — PANTOPRAZOLE SODIUM 40 MG PO TBEC: 40.0000 mg | DELAYED_RELEASE_TABLET | Freq: Every day | ORAL | 2 refills | Status: DC

## 2022-01-31 MED ORDER — ATORVASTATIN CALCIUM 40 MG PO TABS: 40.0000 mg | ORAL_TABLET | Freq: Every day | ORAL | 2 refills | Status: DC

## 2022-01-31 NOTE — Telephone Encounter (Signed)
Pt following up w/ Dr. Georgina Snell today.  Will discuss at that time. ? ?

## 2022-01-31 NOTE — Telephone Encounter (Signed)
We need to re-check TSH to see if dose needs to be adjusted. ?She is supposed to be following with endocrinologist. ?Thanks, ?BJ ?

## 2022-01-31 NOTE — Telephone Encounter (Signed)
Her daughter will bring her by this afternoon for TSH recheck.  ?

## 2022-01-31 NOTE — Progress Notes (Deleted)
? ?I, Darlene Griffith, LAT, ATC acting as a scribe for Darlene Leader, MD. ? ?Darlene Griffith is a 71 y.o. female who presents to [place] at [facility] today for f/u thoracic and lumbar back pain following a transcatheter aortic valve replacement and bypass grafting.  During the procedure she had loss of blood pressure and had chest compressions. Pt was last seen by Dr. Georgina Griffith on 01/21/22 and was referred to [facility] for potential thoracic facet injection. Pt was advised to cont PT, but only completed 1 additional visit, 4 in total, and no-show'ed on 01/30/22. Today, pt reports ? ?Dx imaging: 12/21/21 L-spine & T-spine MRI ?05/28/18 L hip/pelvis XR ? ?Pertinent review of systems: *** ? ?Relevant historical information: *** ? ? ?Exam:  ?There were no vitals taken for this visit. ?General: Well Developed, well nourished, and in no acute distress.  ? ?MSK: *** ? ? ? ?Lab and Radiology Results ?No results found for this or any previous visit (from the past 72 hour(s)). ?CTA CHEST/ABD/PEL DISSECTION W&/OR WO & GATING ? ?Result Date: 01/28/2022 ?CLINICAL DATA:  Thoracic aortic dissection status post TEVAR. EXAM: CT ANGIOGRAPHY CHEST, ABDOMEN AND PELVIS TECHNIQUE: Non-contrast CT of the chest was initially obtained. Multidetector CT imaging through the chest, abdomen and pelvis was performed using the standard protocol during bolus administration of intravenous contrast. Multiplanar reconstructed images and MIPs were obtained and reviewed to evaluate the vascular anatomy. RADIATION DOSE REDUCTION: This exam was performed according to the departmental dose-optimization program which includes automated exposure control, adjustment of the mA and/or kV according to patient size and/or use of iterative reconstruction technique. CONTRAST:  176m OMNIPAQUE IOHEXOL 350 MG/ML SOLN COMPARISON:  Prior CTA of the chest, abdomen and pelvis 11/28/2021 FINDINGS: CTA CHEST FINDINGS Cardiovascular: The aortic root is normal in  caliber as is the ascending thoracic aorta. Stable postoperative changes of thoracic endovascular aortic repair extending from just beyond the origin of the common trunk of the brachiocephalic and left common carotid artery and extending through the transverse and descending thoracic aorta. There is a patent snorkel stent graft extending through a fenestration into the left subclavian artery. Of note, the snorkel is fairly acutely angulated as it passes through the graft fenestration. No evidence of thrombus buildup within the graft. Two vessel arch anatomy. The right brachiocephalic and left common carotid artery share a common origin. Of note, the uncovered portion of the stent graft partially overlies the origin of the left common carotid artery. The main pulmonary artery is normal in caliber. Mild calcified atherosclerotic plaque throughout the coronary arteries. The heart is normal in size. No pericardial effusion. Mediastinum/Nodes: Massive goitrous enlargement of the thyroid gland with significant intrathoracic extension. No suspicious adenopathy. Unremarkable esophagus. Lungs/Pleura: Stable 5 mm subpleural nodule in the periphery of the right lung apex (image 39 series 7). Mild paraseptal emphysematous changes. The lungs are otherwise clear. Musculoskeletal: No acute osseous abnormality. Review of the MIP images confirms the above findings. CTA ABDOMEN AND PELVIS FINDINGS VASCULAR Aorta: Interval vascular is a shin of the previously thrombosed false lumen resulting in remodeling of the supra visceral abdominal aorta. Mild aneurysmal dilation measuring up to 3.1 cm (image 142 series 9). The dissection flap extends throughout the abdominal aorta and into the bilateral iliac arteries as before. No distal propagation. Celiac: Patent without evidence of aneurysm, dissection, vasculitis or significant stenosis. SMA: Patent without evidence of aneurysm, dissection, vasculitis or significant stenosis. Renals: Both  renal arteries are patent without evidence of aneurysm,  dissection, vasculitis, fibromuscular dysplasia or significant stenosis. IMA: Patent without evidence of aneurysm, dissection, vasculitis or significant stenosis. Inflow: Dissection flap extends through the common iliac artery on the right and just into the external iliac artery. No evidence of flow limitation or aneurysm. The dissection flap extends proximally into the common iliac artery on the left and into the left internal iliac artery. Mild flow limitation in the internal iliac artery. The common and external iliac artery remain patent. Evidence of prior cutdown at the left common femoral artery without evidence of complication or pseudoaneurysm. Veins: No focal venous abnormality. Review of the MIP images confirms the above findings. NON-VASCULAR Hepatobiliary: Stable circumscribed low-attenuation liver lesions most consistent with simple cysts. Gallbladder is unremarkable. No intra or extrahepatic biliary ductal dilatation. Pancreas: Unremarkable. No pancreatic ductal dilatation or surrounding inflammatory changes. Spleen: Normal in size without focal abnormality. Adrenals/Urinary Tract: Adrenal glands are unremarkable. Kidneys are normal, without renal calculi, focal solid lesion, or hydronephrosis. Circumscribed low-attenuation renal lesions consistent with simple cysts demonstrate no significant interval change compared to recent imaging. Bladder is unremarkable. Stomach/Bowel: Stomach is within normal limits. Appendix appears normal. No evidence of bowel wall thickening, distention, or inflammatory changes. Lymphatic: No suspicious lymphadenopathy. Reproductive: Uterus and bilateral adnexa are unremarkable. Other: Peripherally calcified structure in the low abdomen/pelvis adjacent to the sigmoid colon almost certainly represents a calcified epiploic appendage (peritoneal mouse). Musculoskeletal: No acute fracture or aggressive appearing lytic or  blastic osseous lesion. Review of the MIP images confirms the above findings. IMPRESSION: 1. Stable endovascular changes of thoracic endovascular repair of aortic dissection with an angulated but patent left subclavian stentgraft snorkel. 2. Evolution of the supra visceral abdominal aorta with new vascularization of the previously thrombosed false lumen and slight interval aneurysmal dilation. Maximal aortic diameter is 3.1 cm. This is considered aneurysmal conversion of a dissection, not a routine abdominal aortic aneurysm and therefore does not follow normal best practice recommendations. Continued attention on follow-up imaging. 3. Otherwise, stable dissection flap propagating through the abdominal aorta and into the bilateral iliac arteries without evidence of flow limitation or new complication. 4. Additional ancillary findings as above without significant interval change compared to relatively recent prior imaging from 11/28/2021. Signed, Criselda Peaches, MD, [facility] Electronically Signed   By: Jacqulynn Cadet M.D.   On: 01/28/2022 10:38   ? ? ? ? ?Assessment and Plan: ?71 y.o. female with *** ? ? ?PDMP not reviewed this encounter. ?No orders of the defined types were placed in this encounter. ? ?No orders of the defined types were placed in this encounter. ? ? ? ?Discussed warning signs or symptoms. Please see discharge instructions. Patient expresses understanding. ? ? ?*** ? ?

## 2022-02-02 ENCOUNTER — Other Ambulatory Visit: Payer: Self-pay | Admitting: Vascular Surgery

## 2022-02-04 ENCOUNTER — Encounter: Payer: Self-pay | Admitting: *Deleted

## 2022-02-04 ENCOUNTER — Ambulatory Visit: Payer: BC Managed Care – PPO | Admitting: Physical Therapy

## 2022-02-05 ENCOUNTER — Encounter: Payer: Self-pay | Admitting: Physical Medicine and Rehabilitation

## 2022-02-05 ENCOUNTER — Ambulatory Visit (INDEPENDENT_AMBULATORY_CARE_PROVIDER_SITE_OTHER): Payer: BC Managed Care – PPO | Admitting: Physical Medicine and Rehabilitation

## 2022-02-05 VITALS — BP 104/56 | HR 78

## 2022-02-05 DIAGNOSIS — G8929 Other chronic pain: Secondary | ICD-10-CM

## 2022-02-05 DIAGNOSIS — M546 Pain in thoracic spine: Secondary | ICD-10-CM | POA: Diagnosis not present

## 2022-02-05 DIAGNOSIS — M7918 Myalgia, other site: Secondary | ICD-10-CM

## 2022-02-05 DIAGNOSIS — M519 Unspecified thoracic, thoracolumbar and lumbosacral intervertebral disc disorder: Secondary | ICD-10-CM | POA: Diagnosis not present

## 2022-02-05 MED ORDER — DIAZEPAM 5 MG PO TABS: ORAL_TABLET | ORAL | 0 refills | Status: DC

## 2022-02-05 NOTE — Progress Notes (Signed)
Pt state middle back pain under her shoulder blade the travels to her lower back. Pt state walking and standing makes the pain worse. Pt state she takes pain meds to help ease her pain. ? ?Numeric Pain Rating Scale and Functional Assessment ?Average Pain 10 ?Pain Right Now 7 ?My pain is constant, sharp, and aching ?Pain is worse with: walking, bending, sitting, standing, some activites, and laying down ?Pain improves with: rest, medication, and injections ? ? ?In the last MONTH (on 0-10 scale) has pain interfered with the following? ? ?1. General activity like being  able to carry out your everyday physical activities such as walking, climbing stairs, carrying groceries, or moving a chair?  ?Rating(6) ? ?2. Relation with others like being able to carry out your usual social activities and roles such as  activities at home, at work and in your community. ?Rating(7) ? ?3. Enjoyment of life such that you have  been bothered by emotional problems such as feeling anxious, depressed or irritable?  ?Rating(8) ? ?

## 2022-02-05 NOTE — Progress Notes (Signed)
? ?Darlene Griffith - 71 y.o. female MRN [id number]  Date of birth: [birth year] ? ?Office Visit Note: ?Visit Date: 02/05/2022 ?PCP: Martinique, Betty G, MD ?Referred by: Martinique, Betty G, MD ? ?Subjective: ?Chief Complaint  ?Patient presents with  ? Middle Back - Pain  ? Lower Back - Pain  ? ?HPI: Darlene Griffith is a 71 y.o. female who comes in today at the request of Dr. [name] for evaluation of chronic, worsening and severe bilateral thoracic back pain. Patient reports symptoms started shortly after transcatheter aortic valve replacement with bypass graft in January. During procedure patient did require resuscitation due to blood loss and hypotension. Patient states her pain is exacerbated by standing, movement and activity, describes as constant sore and aching sensation, currently rates as 8 out of 10. Patient reports some relief of pain with rest, TENS unit and use of medications. Patient states she does take Tizanidine as needed. Patient currently undergoing formal physical therapy at [facility] and reports short term relief of pain with these treatments. Patients recent thoracic MRI imaging exhibits small disc protrusions at T5-T6, T6-T7, T7-T8 and T8-T9. No foraminal or high grade spinal canal stenosis noted. 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 Patient states she was recently taken out of work temporarily. Patient also reports history of chronic lower back pain, recently underwent bilateral L4-L5 facet joint injections at [facility] with some relief of pain. Patient is currently using wheelchair to assist with ambulation. Patient denies focal weakness, numbness and tingling. Patient denies recent trauma or falls.  ? ?Review of Systems  ?Musculoskeletal:  Positive for back pain.  ?Neurological:  Negative for tingling, sensory change, focal weakness and weakness.  ?All  other systems reviewed and are negative. Otherwise per HPI. ? ?Assessment & Plan: ?Visit Diagnoses:  ?  ICD-10-CM   ?1. Chronic bilateral thoracic back pain  M54.6   ? G89.29   ?  ?2. Intervertebral thoracic disc disorder  M51.9   ?  ?3. Myofascial pain syndrome  M79.18   ?  ?   ?Plan: Findings:  ?Chronic, worsening and severe bilateral thoracic back pain.  Patient continues to have severe pain despite good conservative therapy such as formal physical therapy, rest, TENS unit and medications. Patients clinical presentation and exam seem to be discogenic in nature, however there could also be a myofascial component contributing to her pain, she does have palpable taut bands noted to bilateral thoracic region upon exam today. We believe the next step is to perform diagnostic and hopefully therapeutic bilateral T7-T8 and T8-T9 facet joint/medial branch blocks under fluoroscopic guidance. We did explain facet joint injection procedure to patient today in detail, she has no questions at this time. Patient did voice concerns with anxiety related to procedure, I did place a proscription for pre-procedure Valium. No red flag symptoms noted upon exam today.   ? ?Meds & Orders: No orders of the defined types were placed in this encounter. ? No orders of the defined types were placed in this encounter. ?  ?Follow-up: Return for Bilateral T7-T8 and T8-T9 facet joint/medial branch blocks.  ? ?Procedures: ?No procedures performed  ?   ? ?Clinical History: ?EXAM: ?MRI THORACIC AND LUMBAR SPINE WITHOUT CONTRAST ?  ?TECHNIQUE: ?Multiplanar and multiecho pulse sequences of the thoracic and lumbar ?spine were obtained without intravenous contrast. ?  ?COMPARISON:  None. ?  ?FINDINGS: ?MRI THORACIC SPINE  FINDINGS ?  ?Alignment:  Physiologic. ?  ?Vertebrae: No fracture, evidence of discitis, or bone lesion. T3 ?hemangioma. ?  ?Cord:  Normal signal and morphology. ?  ?Paraspinal and other soft tissues: Incompletely thyroid goiter  with ?mediastinal extension. ?  ?Disc levels: ?  ?No spinal canal stenosis. There are small disc protrusions at T5-6, ?T6-7, T7-8 and T8-9. Neural foramina are widely patent. ?  ?MRI LUMBAR SPINE FINDINGS ?  ?Segmentation:  Standard. ?  ?Alignment:  Physiologic. ?  ?Vertebrae:  No fracture, evidence of discitis, or bone lesion. ?  ?Conus medullaris and cauda equina: Conus extends to the L1 level. ?Conus and cauda equina appear normal. ?  ?Paraspinal and other soft tissues: Incompletely visualized aortic ?dissection. ?  ?Disc levels: ?  ?There is no spinal canal or neural foraminal stenosis. There is ?moderate facet arthrosis at L4-5. ?  ?IMPRESSION: ?1. No acute abnormality of the thoracic or lumbar spine. ?2. Mild thoracic degenerative disc disease without spinal canal or ?neural foraminal stenosis. ?3. Incompletely visualized aortic dissection and thyroid goiter, ?both of which have been evaluated on previous imaging. ?  ?  ?Electronically Signed ?  By: Ulyses Jarred M.D. ?  On: 12/21/2021 22:39  ? ?She reports that she has been smoking cigarettes. She has never used smokeless tobacco.  ?Recent Labs  ?  05/15/21 ?1042 11/22/21 ?0639  ?HGBA1C  --  5.0  ?LABURIC 4.3  --   ? ? ?Objective:  VS:  HT:    WT:   BMI:     BP: ?(!) 104/56  HR:78bpm  TEMP: ( )  RESP:  ?Physical Exam ?Vitals and nursing note reviewed.  ?HENT:  ?   Head: Normocephalic and atraumatic.  ?   Right Ear: External ear normal.  ?   Left Ear: External ear normal.  ?   Nose: Nose normal.  ?   Mouth/Throat:  ?   Mouth: Mucous membranes are moist.  ?Eyes:  ?   Extraocular Movements: Extraocular movements intact.  ?Cardiovascular:  ?   Rate and Rhythm: Normal rate.  ?   Pulses: Normal pulses.  ?Pulmonary:  ?   Effort: Pulmonary effort is normal.  ?Abdominal:  ?   General: Abdomen is flat. There is no distension.  ?Musculoskeletal:     ?   General: Tenderness present.  ?   Cervical back: Normal range of motion.  ?   Comments: Pt is slow to rise from  seated position to standing without difficulty. Pain noted upon facet loading. Strong distal strength without clonus, no pain upon palpation of greater trochanters. Sensation intact bilaterally. Palpable taut bands noted to bilateral thoracic region upon palpation. Using wheelchair to assist with ambulation, gait slow and unsteady.  ?Skin: ?   General: Skin is warm and dry.  ?   Capillary Refill: Capillary refill takes less than 2 seconds.  ?Neurological:  ?   General: No focal deficit present.  ?   Mental Status: She is alert and oriented to person, place, and time.  ?Psychiatric:     ?   Mood and Affect: Mood normal.     ?   Behavior: Behavior normal.  ?  ?Ortho Exam ? ?Imaging: ?No results found. ? ?Past Medical/Family/Surgical/Social History: ?Medications & Allergies reviewed per EMR, new medications updated. ?Patient Active Problem List  ? Diagnosis Date Noted  ? COPD (chronic obstructive pulmonary disease) ([place]) 11/08/2021  ? AKI (acute kidney injury) ([place])   ? Aortic dissection ([place]) 11/06/2021  ? Gastroesophageal  reflux disease without esophagitis   ? Hyperthyroidism 04/19/2021  ? Snoring 03/02/2020  ? Tobacco use 03/02/2020  ? Genetic testing 02/10/2020  ? Family history of lung cancer   ? Personal history of colonic polyps   ? Dysphagia 01/06/2020  ? Cervical spondylosis with myelopathy and radiculopathy 07/05/2019  ? Neck pain, chronic 05/21/2019  ? Insomnia 07/27/2018  ? Multinodular goiter 07/14/2018  ? Chronic fatigue 06/15/2018  ? Midsternal chest pain 02/18/2015  ? Chest pain   ? Cough   ? Essential hypertension   ? Shortness of breath   ? Aortic stenosis   ? Pain in the chest   ? EKG abnormality   ? Malaise and fatigue   ? Lung nodule   ? Splenic cyst   ? Hepatic cyst   ? Hyperlipidemia   ? Hypertension 02/16/2015  ? ?Past Medical History:  ?Diagnosis Date  ? Chest pain   ? a. 01/2015 Echo: Nl LV fxn, Gr 1 DD, triv AI, mild MR.  ? Essential hypertension   ? Family history of lung cancer   ? Hepatic  cyst   ? a. noted on CT 01/2015.  ? Hyperthyroidism   ? GOING TO [facility] FOR SECOND OPINION  ? Multinodular goiter   ? a. 01/2015 CT chest: multinodular goidter w/ substernal extension of the left lobe of the thyroid assoc

## 2022-02-06 ENCOUNTER — Ambulatory Visit: Payer: BC Managed Care – PPO | Admitting: Physical Therapy

## 2022-02-06 ENCOUNTER — Telehealth: Payer: Self-pay

## 2022-02-06 NOTE — Telephone Encounter (Signed)
FYI ? ?Patient states that she is scheduled to go back to work is on the 25th of this month and she is having horrible back pain. Patient states that she seen Dr. Ernestina Patches yesterday and is currently waiting on the injections to get approved by insurance. Patient states that she is unable to do anything with this pain and has given leave of absence a call to call us and send  paperwork or whatever else they need to do. Patient just wanted [place] to be aware of this. ?

## 2022-02-06 NOTE — Telephone Encounter (Signed)
Noted. Will be happy to extend leave.  ?

## 2022-02-07 NOTE — Telephone Encounter (Signed)
It seems like TSH was "collected" but I do not see result. ?Thanks, ?BJ ?

## 2022-02-10 NOTE — Progress Notes (Signed)
[facility] ?PROGRESS NOTE ? ?ASSESSMENT / PLAN: ?Darlene Griffith is a 71 y.o. female status post  : ? ?1) bilateral ultrasound-guided common femoral artery access ?2) left brachial artery exposure and percutaneous access ?3) thoracic branched endovascular aortic repair (see below for device details) ?4) left common and external iliac artery stenting (11 x 54m VBX x2) ?5) left common iliac to common femoral artery bypass with 8 mm externally supported PTFE ? ?For symptomatic type b aortic dissection having failed conservative management on 11/11/21. Her intraoperative course was complicated by iliac artery rupture requiring retroperitoneal exposure of the left iliac vessels and a left common femoral to common femoral bypass. Her postoperative course was marked by persistent pain and difficulty mobilizing. She otherwise did remarkably well. Follow up CT scan 11/28/21 showed remodeling of the aorta and patency of the bypass.   ? ?She is improving slowly.  Injection seems to be improving her back pain.  She has new evidence of a hernia in her left lower quadrant.  This is broad-based and does not seem to be obstructing.  I counseled her to avoid repair as long as she can tolerate. ? ?I will see her again in 6 months with repeat CT angiogram. ? ?SUBJECTIVE: ?Patient returns to clinic.  She is overall feeling better.  She thinks spinal injections are helping her back pain. ? ?OBJECTIVE: ?BP 128/71 (BP Location: Left Arm, Patient Position: Sitting, Cuff Size: Normal)   Pulse 78   Temp 98 ?F (36.7 ?C)   Resp 20   Ht 5' (1.524 m)   Wt 132 lb (59.9 kg)   SpO2 98%   BMI 25.78 kg/m?  ? ?Constitutional: No distress ?CNS: No focal deficits. ?Cardiac: Regular rate and rhythm. ?Pulmonary: Unlabored breathing ?Abdomen: Soft, nontender abdomen.  Abdominal incisions healing well. ?Vascular: 2+ radial pulses.  2+ dorsalis pedis pulses.  Well-healing left upper extremity incision. ? ? ?  Latest Ref  Rng & Units 12/13/2021  ? 11:05 AM 11/28/2021  ?  5:11 AM 11/22/2021  ?  6:39 AM  ?CBC  ?WBC 4.0 - 10.5 K/uL 8.8   10.3   10.7    ?Hemoglobin 12.0 - 15.0 g/dL 11.3   10.8   9.2    ?Hematocrit 36.0 - 46.0 % 34.0   33.2   29.0    ?Platelets 150.0 - 400.0 K/uL 439.0   188   111    ?  ? ? ?  Latest Ref Rng & Units 12/13/2021  ? 11:05 AM 11/28/2021  ?  5:11 AM 11/22/2021  ?  6:39 AM  ?CMP  ?Glucose 70 - 99 mg/dL 86   111   90    ?BUN 6 - 23 mg/dL '9   14   13    '$ ?Creatinine 0.40 - 1.20 mg/dL 0.42   0.48   0.47    ?Sodium 135 - 145 mEq/L 137   138   137    ?Potassium 3.5 - 5.1 mEq/L 3.6   4.1   3.8    ?Chloride 96 - 112 mEq/L 99   103   101    ?CO2 19 - 32 mEq/L 32   24   27    ?Calcium 8.4 - 10.5 mg/dL 10.3   9.6   8.6    ?Total Protein 6.0 - 8.3 g/dL 8.3    6.0    ?Total Bilirubin 0.2 - 1.2 mg/dL 0.3    0.6    ?Alkaline Phos  39 - 117 U/L 115    77    ?AST 0 - 37 U/L 16    53    ?ALT 0 - 35 U/L 16    44    ? ? ?CrCl cannot be calculated (Patient's most recent lab result is older than the maximum 21 days allowed.). ? ? ?CT angiogram personally reviewed in detail.  Excellent remodeling continues in the thoracic aorta.  The left subclavian artery stent is patent.  The left aortofemoral bypass is patent.  There is some distal stenosis, which seems unchanged from prior scans.  She has evidence of a broad-based abdominal wall hernia in her left lower quadrant. ? ?[name]. Stanford Breed, MD ?[facility] ?Office Phone Number: [phone] ?02/11/2022 12:51 PM ? ? ? ?

## 2022-02-11 ENCOUNTER — Encounter: Payer: Self-pay | Admitting: Vascular Surgery

## 2022-02-11 ENCOUNTER — Ambulatory Visit (INDEPENDENT_AMBULATORY_CARE_PROVIDER_SITE_OTHER): Payer: BC Managed Care – PPO | Admitting: Vascular Surgery

## 2022-02-11 VITALS — BP 128/71 | HR 78 | Temp 98.0°F | Resp 20 | Ht 60.0 in | Wt 132.0 lb

## 2022-02-11 DIAGNOSIS — Z95828 Presence of other vascular implants and grafts: Secondary | ICD-10-CM

## 2022-02-13 ENCOUNTER — Telehealth: Payer: Self-pay | Admitting: Family Medicine

## 2022-02-13 ENCOUNTER — Encounter
Payer: BC Managed Care – PPO | Attending: Physical Medicine and Rehabilitation | Admitting: Physical Medicine and Rehabilitation

## 2022-02-13 DIAGNOSIS — G8929 Other chronic pain: Secondary | ICD-10-CM | POA: Insufficient documentation

## 2022-02-13 DIAGNOSIS — M5440 Lumbago with sciatica, unspecified side: Secondary | ICD-10-CM | POA: Insufficient documentation

## 2022-02-13 NOTE — Telephone Encounter (Signed)
I spoke with Hoyle Sauer.  ?We will proceed with current plan with Dr. Ernestina Patches.  Consider trying other PMNR doctors to see if they can get her in sooner.  We will look around.  I expressed that it is unlikely that other groups are going to be able to get her in for medial branch block and ablation sooner than May 31 but it is at least worth exploring. ? ?We will fill out extended leave paperwork as soon as we get it.  I looked and I do not have the forms at my desk right now. ?

## 2022-02-13 NOTE — Telephone Encounter (Signed)
We referred pt to Dr. Romona Curls office. She is concerned about several things and wants to see if we can help. ? ?Per pt, their office has been short staffed and delayed getting authorization for her procedure. It has been denied and not sure when they will be able to do a peer to peer. Also, if approved their first avail appt for the procedure is 5/31. ? ?She is in pain, unable to sleep and very concerned about delaying her return to work. Tearful on the phone. ? ?Is this something we can order at [facility] or can we somehow help her get this done more quickly? She is not confident with the current office. ? ?Did we receive forms to extend her leave? Are we able to help with this. ? ?Please call patient asap. ?

## 2022-02-14 ENCOUNTER — Telehealth: Payer: Self-pay | Admitting: Family Medicine

## 2022-02-14 DIAGNOSIS — G8929 Other chronic pain: Secondary | ICD-10-CM

## 2022-02-14 NOTE — Telephone Encounter (Signed)
I called Darlene Griffith back.  Stated that I still have not received the paperwork from her return to work/leave of absence people.  Also clarified locations for thoracic spine medial branch block and ablation. ?

## 2022-02-18 MED ORDER — METHIMAZOLE 5 MG PO TABS: 2.5000 mg | ORAL_TABLET | Freq: Three times a day (TID) | ORAL | 0 refills | Status: DC

## 2022-02-26 ENCOUNTER — Telehealth: Payer: Self-pay | Admitting: Family Medicine

## 2022-02-26 ENCOUNTER — Telehealth: Payer: Self-pay | Admitting: Physical Medicine and Rehabilitation

## 2022-02-26 NOTE — Telephone Encounter (Signed)
Patient called to let Dr Georgina Snell know that she is scheduled to see [facility] on May 24th. ? ?

## 2022-02-26 NOTE — Telephone Encounter (Signed)
Patient called asked for a call back and let her know if a peer to peer was done for the denial for the injection for her back.  Patient asked for a call back as soon as possible. Patient said she is in a great deal of pain and need some help.  The number to contact patient is [phone]   ?

## 2022-03-04 ENCOUNTER — Telehealth: Payer: Self-pay | Admitting: Family Medicine

## 2022-03-04 NOTE — Telephone Encounter (Signed)
Patient called stating that she has been out of work due to her back pain. She has been working on getting short term disability but they have not received the medical records that was requested back on April 30th. ? ?I spoke to the Case Manager, Vashti Hey [phone]). I gave her the medical records fax number to confirm that they received the first request. If they do not receive them by Thursday, they will be closing her case. We have already completed our portion of what was needed. ? ?When speaking to the patient, she said that she was referred to Dr Ernestina Patches for an epidural. They were not able to get it approved. ?She is now scheduled to see [facility] on 03/12/2022. ? ? ?

## 2022-03-04 NOTE — Telephone Encounter (Signed)
I called and spoke with the people.  I think we got it sorted out.  I have literally printed everything I think they could need and will fax it to them right now. ?

## 2022-03-13 DIAGNOSIS — Z6828 Body mass index (BMI) 28.0-28.9, adult: Secondary | ICD-10-CM | POA: Diagnosis not present

## 2022-03-13 DIAGNOSIS — M546 Pain in thoracic spine: Secondary | ICD-10-CM | POA: Diagnosis not present

## 2022-03-13 DIAGNOSIS — M47816 Spondylosis without myelopathy or radiculopathy, lumbar region: Secondary | ICD-10-CM | POA: Diagnosis not present

## 2022-03-14 ENCOUNTER — Other Ambulatory Visit: Payer: Self-pay | Admitting: Vascular Surgery

## 2022-03-14 ENCOUNTER — Other Ambulatory Visit: Payer: Self-pay | Admitting: Family Medicine

## 2022-03-14 DIAGNOSIS — E0590 Thyrotoxicosis, unspecified without thyrotoxic crisis or storm: Secondary | ICD-10-CM

## 2022-03-18 ENCOUNTER — Telehealth: Payer: Self-pay | Admitting: Family Medicine

## 2022-03-18 ENCOUNTER — Other Ambulatory Visit: Payer: Self-pay | Admitting: Family Medicine

## 2022-03-18 DIAGNOSIS — E0590 Thyrotoxicosis, unspecified without thyrotoxic crisis or storm: Secondary | ICD-10-CM

## 2022-03-18 MED ORDER — AMLODIPINE BESYLATE 10 MG PO TABS: 10.0000 mg | ORAL_TABLET | Freq: Every day | ORAL | 1 refills | Status: DC

## 2022-03-18 MED ORDER — ATORVASTATIN CALCIUM 40 MG PO TABS: 40.0000 mg | ORAL_TABLET | Freq: Every day | ORAL | 2 refills | Status: DC

## 2022-03-18 MED ORDER — CARVEDILOL 25 MG PO TABS: 25.0000 mg | ORAL_TABLET | Freq: Two times a day (BID) | ORAL | 1 refills | Status: DC

## 2022-03-18 MED ORDER — METHIMAZOLE 5 MG PO TABS: 2.5000 mg | ORAL_TABLET | Freq: Three times a day (TID) | ORAL | 0 refills | Status: DC

## 2022-03-18 MED ORDER — PANTOPRAZOLE SODIUM 40 MG PO TBEC: 40.0000 mg | DELAYED_RELEASE_TABLET | Freq: Every day | ORAL | 2 refills | Status: AC

## 2022-03-18 MED ORDER — LOSARTAN POTASSIUM 25 MG PO TABS: 25.0000 mg | ORAL_TABLET | Freq: Every day | ORAL | 1 refills | Status: AC

## 2022-03-18 MED ORDER — ASCORBIC ACID 1000 MG PO TABS: 1000.0000 mg | ORAL_TABLET | Freq: Every day | ORAL | 0 refills | Status: AC

## 2022-03-18 NOTE — Telephone Encounter (Signed)
Pt daughter call and stated pt need refills on amLODipine (NORVASC) 10 MG tablet ,losartan (COZAAR) 25 MG tablet ,pantoprazole (PROTONIX) 40 MG tablet,atorvastatin (LIPITOR) 40 MG tablet ,DULoxetine (CYMBALTA) 30 MG capsule ,carvedilol (COREG) 25 MG tablet ,methimazole (TAPAZOLE) 5 MG tablet  and want them sent to  [facility]  - [place] Phone:  [phone]  Fax:  [phone]    Pt daughter stated she would like if you would sent a mychart message to let pt know the ones she don't need any more.

## 2022-03-19 MED ORDER — NITROGLYCERIN 0.4 MG SL SUBL
0.4000 mg | SUBLINGUAL_TABLET | SUBLINGUAL | 0 refills | Status: DC | PRN
Start: 2022-03-19 — End: 2022-07-15

## 2022-03-20 DIAGNOSIS — Z6826 Body mass index (BMI) 26.0-26.9, adult: Secondary | ICD-10-CM | POA: Diagnosis not present

## 2022-03-20 DIAGNOSIS — M546 Pain in thoracic spine: Secondary | ICD-10-CM | POA: Diagnosis not present

## 2022-03-20 DIAGNOSIS — M47816 Spondylosis without myelopathy or radiculopathy, lumbar region: Secondary | ICD-10-CM | POA: Diagnosis not present

## 2022-03-21 ENCOUNTER — Ambulatory Visit: Payer: BC Managed Care – PPO | Admitting: Podiatry

## 2022-03-21 ENCOUNTER — Telehealth: Payer: Self-pay | Admitting: Family Medicine

## 2022-03-21 DIAGNOSIS — M545 Low back pain, unspecified: Secondary | ICD-10-CM

## 2022-03-21 NOTE — Telephone Encounter (Signed)
Patient called to let Dr Georgina Snell know that she went to see [facility]. They told her that they would only recommend have epidural steroid injections in her lower back, they did not see a need for her to have one in her mid back based on her MRI. They have her scheduled for the epidural in their office on June 22nd. However, being that this will be the same injection she has had in the past, she asked if Dr Georgina Snell would be able to order the lower back epidural for her at [facility]. She said that she enjoyed their staff and the experience there and would prefer to go back there if possible.  The patient called [name] at [facility] and was told that if she got the order this morning, they would be able to schedule her for next Friday.  Please advise.

## 2022-03-21 NOTE — Telephone Encounter (Signed)
I did not get TSH results. I sent a small supply of her thyroid medication, Methimazole. She has an appointment with endocrinologist in a few days, she needs to keep that appointment. Thanks, BJ

## 2022-03-24 NOTE — Telephone Encounter (Signed)
Pt informed via VM on cell that procedures ordered.

## 2022-03-24 NOTE — Telephone Encounter (Signed)
I have ordered repeat facet injections.  You had facet injections at lumbar spine L4-L5 and March.  I have reordered these.  Please contact [facility] to schedule.  Phone number is [phone].

## 2022-03-25 ENCOUNTER — Ambulatory Visit
Admission: RE | Admit: 2022-03-25 | Discharge: 2022-03-25 | Disposition: A | Discharge status: Home / Self Care | Payer: BC Managed Care – PPO | Source: Ambulatory Visit | Attending: Family Medicine | Admitting: Family Medicine

## 2022-03-25 ENCOUNTER — Telehealth: Payer: Self-pay | Admitting: Family Medicine

## 2022-03-25 DIAGNOSIS — G8929 Other chronic pain: Secondary | ICD-10-CM

## 2022-03-25 DIAGNOSIS — M545 Low back pain, unspecified: Secondary | ICD-10-CM

## 2022-03-25 MED ORDER — METHYLPREDNISOLONE ACETATE 40 MG/ML INJ SUSP (RADIOLOG
80.0000 mg | Freq: Once | INTRAMUSCULAR | Status: AC
  Administered 2022-03-25: 80 mg via INTRA_ARTICULAR

## 2022-03-25 MED ORDER — IOPAMIDOL (ISOVUE-M 200) INJECTION 41%
1.0000 mL | Freq: Once | INTRAMUSCULAR | Status: AC
  Administered 2022-03-25: 1 mL via INTRA_ARTICULAR

## 2022-03-25 NOTE — Discharge Instructions (Signed)

## 2022-03-25 NOTE — Telephone Encounter (Signed)
Pt had epidural at [facility] today. She wanted Dr. Georgina Snell to know that per Dr. Maree Erie they have 3 providers that will perform this procedure on the mid back.  Pt would like referral/order done to have this performed on her mid back, Dr. Maree Erie advised this could be done in the next 2 weeks. She was very happy with the service she recd there today.

## 2022-03-26 NOTE — Telephone Encounter (Signed)
Epidural steroid injection ordered.  Please contact [facility] to schedule.  Phone number is [phone].

## 2022-03-26 NOTE — Telephone Encounter (Signed)
Patient called this morning to let us know that [facility] called her and she is scheduled for her Epidural on June 28th. She said to let Dr Georgina Snell know how much she appreciated him and all that he and our staff have done for her. She said "thank you from the bottom of my heart!".  [name])

## 2022-03-28 NOTE — Telephone Encounter (Signed)
Dr Stanford Breed had advised patient she did not need to be off for extended time. Her request is not related to her procedure with [place] she was advised by Triage to follow with her PCP for her request.

## 2022-04-10 ENCOUNTER — Other Ambulatory Visit: Payer: Self-pay | Admitting: Family Medicine

## 2022-04-10 ENCOUNTER — Ambulatory Visit
Admission: RE | Admit: 2022-04-10 | Discharge: 2022-04-10 | Disposition: A | Discharge status: Home / Self Care | Payer: BC Managed Care – PPO | Source: Ambulatory Visit | Attending: Family Medicine | Admitting: Family Medicine

## 2022-04-10 DIAGNOSIS — G8929 Other chronic pain: Secondary | ICD-10-CM

## 2022-04-10 DIAGNOSIS — M47814 Spondylosis without myelopathy or radiculopathy, thoracic region: Secondary | ICD-10-CM | POA: Diagnosis not present

## 2022-04-10 MED ORDER — TRIAMCINOLONE ACETONIDE 40 MG/ML IJ SUSP (RADIOLOGY)
60.0000 mg | Freq: Once | INTRAMUSCULAR | Status: AC
  Administered 2022-04-10: 60 mg via EPIDURAL

## 2022-04-10 MED ORDER — IOPAMIDOL (ISOVUE-M 300) INJECTION 61%
1.0000 mL | Freq: Once | INTRAMUSCULAR | Status: AC
  Administered 2022-04-10: 1 mL via EPIDURAL

## 2022-04-10 NOTE — Discharge Instructions (Signed)

## 2022-04-14 ENCOUNTER — Telehealth: Payer: Self-pay | Admitting: Family Medicine

## 2022-04-15 ENCOUNTER — Encounter: Payer: Self-pay | Admitting: Family Medicine

## 2022-04-15 NOTE — Telephone Encounter (Signed)
That is great news.  I am glad you are feeling better.  I have faxed the letter today.

## 2022-04-15 NOTE — Progress Notes (Signed)
Patient called asking if information could be sent to Case Manager - Darlene Griffith  Fax # [phone]   She said they need a letter stating that she will be returning to work on July 10th and the dates of her recent injections.    She is feeling much better and was very impressed Paediatric nurse on [employer].

## 2022-04-16 ENCOUNTER — Other Ambulatory Visit: Payer: BC Managed Care – PPO

## 2022-05-02 DIAGNOSIS — E049 Nontoxic goiter, unspecified: Secondary | ICD-10-CM | POA: Diagnosis not present

## 2022-05-02 DIAGNOSIS — R0789 Other chest pain: Secondary | ICD-10-CM | POA: Diagnosis not present

## 2022-05-02 DIAGNOSIS — R079 Chest pain, unspecified: Secondary | ICD-10-CM | POA: Diagnosis not present

## 2022-05-02 DIAGNOSIS — Z87891 Personal history of nicotine dependence: Secondary | ICD-10-CM | POA: Diagnosis not present

## 2022-05-02 DIAGNOSIS — Z8679 Personal history of other diseases of the circulatory system: Secondary | ICD-10-CM | POA: Diagnosis not present

## 2022-05-02 DIAGNOSIS — R350 Frequency of micturition: Secondary | ICD-10-CM | POA: Diagnosis not present

## 2022-05-02 DIAGNOSIS — R109 Unspecified abdominal pain: Secondary | ICD-10-CM | POA: Diagnosis not present

## 2022-05-02 DIAGNOSIS — R7401 Elevation of levels of liver transaminase levels: Secondary | ICD-10-CM | POA: Diagnosis not present

## 2022-05-02 DIAGNOSIS — R0602 Shortness of breath: Secondary | ICD-10-CM | POA: Diagnosis not present

## 2022-05-02 DIAGNOSIS — I252 Old myocardial infarction: Secondary | ICD-10-CM | POA: Diagnosis not present

## 2022-05-02 DIAGNOSIS — R110 Nausea: Secondary | ICD-10-CM | POA: Diagnosis not present

## 2022-05-04 DIAGNOSIS — R079 Chest pain, unspecified: Secondary | ICD-10-CM | POA: Diagnosis not present

## 2022-05-20 ENCOUNTER — Other Ambulatory Visit: Payer: Self-pay | Admitting: Family Medicine

## 2022-05-20 DIAGNOSIS — E0590 Thyrotoxicosis, unspecified without thyrotoxic crisis or storm: Secondary | ICD-10-CM

## 2022-05-30 NOTE — Telephone Encounter (Signed)
Apparently she received nitro sublingual #30 tabs and she is requesting refills. How often she is taking this medication? She was evaluated in the ED in 04/2022 for CP, most likely she was instructed to arrange f/u appt. If she is still having CP she needs to see her cardiologist, she was last seen for CP in 2016. Thanks, BJ

## 2022-06-06 ENCOUNTER — Ambulatory Visit (INDEPENDENT_AMBULATORY_CARE_PROVIDER_SITE_OTHER): Payer: BC Managed Care – PPO | Admitting: Family Medicine

## 2022-06-06 ENCOUNTER — Encounter: Payer: Self-pay | Admitting: Family Medicine

## 2022-06-06 VITALS — BP 124/78 | HR 80 | Ht 60.0 in | Wt 144.8 lb

## 2022-06-06 DIAGNOSIS — M79652 Pain in left thigh: Secondary | ICD-10-CM | POA: Diagnosis not present

## 2022-06-06 DIAGNOSIS — G8929 Other chronic pain: Secondary | ICD-10-CM

## 2022-06-06 DIAGNOSIS — M546 Pain in thoracic spine: Secondary | ICD-10-CM | POA: Diagnosis not present

## 2022-06-06 NOTE — Patient Instructions (Signed)
Thank you for coming in today.   Please call [facility] at [phone] to schedule your spine injection.    You should hear from neurology about the nerve conduction study soon.

## 2022-06-06 NOTE — Progress Notes (Signed)
I, Peterson Lombard, LAT, ATC acting as a scribe for [name], MD.  Darlene Griffith is a 71 y.o. female who presents to [place] at [facility] today for f/u severe mid-thoracic and lower lumbar back pain following a transcatheter aortic valve replacement and bypass grafting.  During the procedure, she had loss of BP and had chest compressions.  She was last seen by Dr. Georgina Snell on 01/21/22 and was advised to cont PT and was referred to [facility] for potential thoracic facet injection. Pt had bilat lumbar facet injections on 03/25/22 and a thoracic ESI on 04/10/22. Today, pt reports she is ready for another facet injections did not provide much relief, but the ESI on 6/22 provided great relief. Pt reports thoracic back pain returned over the last couple weeks. Pt would like a new handicap placard and FMLA paperwork completed.    She notes a painful burning stinging patch of skin on the inner left thigh.  This has been ongoing for the last several months.  Additionally she had surgery on her abdomen and the area around the surgical incision on the left lower abdomen is starting to bulge.  It is not tender itself but she wonders if that bulging is causing some of her back pain as she cannot sit up normally.  It is affecting how she has to stand and sit.  Dx imaging: 12/21/21 L-spine & T-spine MRI 05/28/18 L hip/pelvis XR  Pertinent review of systems: No fevers or chills  Relevant historical information: History of aortic dissection with surgery and hospital discharge 2023.   Exam:  BP 124/78   Pulse 80   Ht 5' (1.524 m)   Wt 144 lb 12.8 oz (65.7 kg)   SpO2 97%   BMI 28.28 kg/m  General: Well Developed, well nourished, and in no acute distress.   MSK:  T-spine: Nontender midline.  Mildly tender to palpation paraspinal musculature.  Decreased thoracic motion. L-spine nontender midline.  Decreased lumbar motion. Left abdominal wall distention and bulging of the left  lower abdominal wall along the site of a horizontal mature incision scar.  No herniation of the scar is present.  Left hip: Normal-appearing normal hip motion.   Lab and Radiology Results  EXAM: MRI THORACIC AND LUMBAR SPINE WITHOUT CONTRAST   TECHNIQUE: Multiplanar and multiecho pulse sequences of the thoracic and lumbar spine were obtained without intravenous contrast.   COMPARISON:  None.   FINDINGS: MRI THORACIC SPINE FINDINGS   Alignment:  Physiologic.   Vertebrae: No fracture, evidence of discitis, or bone lesion. T3 hemangioma.   Cord:  Normal signal and morphology.   Paraspinal and other soft tissues: Incompletely thyroid goiter with mediastinal extension.   Disc levels:   No spinal canal stenosis. There are small disc protrusions at T5-6, T6-7, T7-8 and T8-9. Neural foramina are widely patent.   MRI LUMBAR SPINE FINDINGS   Segmentation:  Standard.   Alignment:  Physiologic.   Vertebrae:  No fracture, evidence of discitis, or bone lesion.   Conus medullaris and cauda equina: Conus extends to the L1 level. Conus and cauda equina appear normal.   Paraspinal and other soft tissues: Incompletely visualized aortic dissection.   Disc levels:   There is no spinal canal or neural foraminal stenosis. There is moderate facet arthrosis at L4-5.   IMPRESSION: 1. No acute abnormality of the thoracic or lumbar spine. 2. Mild thoracic degenerative disc disease without spinal canal or neural foraminal stenosis. 3. Incompletely  visualized aortic dissection and thyroid goiter, both of which have been evaluated on previous imaging.     Electronically Signed   By: Ulyses Jarred M.D.   On: 12/21/2021 22:39   I, [name], personally (independently) visualized and performed the interpretation of the images attached in this note.'     Assessment and Plan: 71 y.o. female with : Chronic thoracic and lumbar pain.  This has been ongoing since her hospital  discharge about 6 months ago.  Fortunately after a long fraught complicated course of care including physical therapy and multiple different facet injections she had fantastic immediate pain relief with an interlaminar thoracic epidural steroid injection at T11-T12 performed by Dr. Jeralyn Ruths April 10, 2022.  Her pain is just starting to return now.  It is very reasonable to try repeating that injection now as that has been the most effective treatment that she has had.  She does have other issues that are probably contributing or complicating her care.  She has this patch of numbness on her left inner thigh and the genitofemoral branch distribution.  Her lumbar and thoracic MRI do not explain this.  Next step is a nerve conduction study to explain this painful numb tingly paresthesia sensation.  She had surgery in the inguinal and abdominal region on the left side and small nerves could have been injured as a direct result were subsequent to the surgery or aftercare of the lifesaving surgery. Plan for nerve conduction study.  Additionally she has distention of the abdominal wall on the left lower abdomen where she has a large horizontal incision which is also contributory.  She is already had physical therapy for this.  Abdominal wall support with abdominal wall binding is probably helpful for now.   Handicap form completed and FMLA form allowing for intermittently is completed. Nerve conduction study ordered an epidural steroid injection ordered.  Total encounter time 30 minutes including face-to-face time with the patient and, reviewing past medical record, and charting on the date of service.    PDMP not reviewed this encounter. Orders Placed This Encounter  Procedures   DG INJECT DIAG/THERA/INC NEEDLE/CATH/PLC EPI/CERV/THOR W/IMG    Level and technique per radiology.  Pt had great benefit prior prior ESI on 6/22. LUMB EPI #2 MCR/BCBS-no auth req x's 2//js (12/21/21) 140 LBS NO ASA NO THINS/OTC NO  NEEDS    Standing Status:   Future    Standing Expiration Date:   07/07/2022    Order Specific Question:   Reason for Exam (SYMPTOM  OR DIAGNOSIS REQUIRED)    Answer:   thoracic back pain    Order Specific Question:   Preferred Imaging Location?    Answer:   [id number]   Ambulatory referral to Neurology    Referral Priority:   Routine    Referral Type:   Consultation    Referral Reason:   Specialty Services Required    Requested Specialty:   Neurology    Number of Visits Requested:   1   NCV with EMG(electromyography)    L leg    Standing Status:   Future    Standing Expiration Date:   06/07/2023    Order Specific Question:   Where should this test be performed?    Answer:   [facility]   No orders of the defined types were placed in this encounter.    Discussed warning signs or symptoms. Please see discharge instructions. Patient expresses understanding.   The above documentation has been reviewed  and is accurate and complete [name], M.D.

## 2022-06-09 ENCOUNTER — Other Ambulatory Visit: Payer: Self-pay

## 2022-06-09 DIAGNOSIS — R202 Paresthesia of skin: Secondary | ICD-10-CM

## 2022-06-10 ENCOUNTER — Ambulatory Visit (INDEPENDENT_AMBULATORY_CARE_PROVIDER_SITE_OTHER): Payer: BC Managed Care – PPO | Admitting: Neurology

## 2022-06-10 DIAGNOSIS — R202 Paresthesia of skin: Secondary | ICD-10-CM | POA: Diagnosis not present

## 2022-06-10 NOTE — Procedures (Addendum)
[facility]  [address]  [place], [place] [address] Tel: [phone] Fax:  [phone] Test Date:  06/10/2022  Patient: Darlene Griffith DOB: February 06, 1951 Physician: Kai Levins, MD  Sex: Female Height: '5\' 1"'$  Ref Phys: [name], MD  ID#: [id number]   Technician:    Patient Complaints: This is a 71 year old female with left medial thigh and low back pain.  NCV & EMG Findings: Extensive electrodiagnostic evaluation of the left lower extremity shows: Left sural and superficial peroneal sensory responses are within normal limits. Left peroneal and tibial motor responses are within normal limits. There is no evidence of active or chronic motor axon loss changes affecting any of the tested muscles. Motor unit configuration and recruitment pattern is within normal limits.  Impression: This is a normal study of the left lower extremity. In particular, there is no electrodiagnostic evidence of a lumbosacral radiculopathy, large fiber sensorimotor neuropathy.   ___________________________ Kai Levins, MD    Nerve Conduction Studies Anti Sensory Summary Table   Stim Site NR Peak (ms) Norm Peak (ms) P-T Amp (V) Norm P-T Amp  Left Sup Peroneal Anti Sensory (Ant Lat Mall)  12 cm    3.1 <4.6 3.3 >3  Left Sural Anti Sensory (Lat Mall)  Calf    3.6 <4.6 9.4 >3   Motor Summary Table   Stim Site NR Onset (ms) Norm Onset (ms) O-P Amp (mV) Norm O-P Amp Site1 Site2 Delta-0 (ms) Dist (cm) Vel (m/s) Norm Vel (m/s)  Left Peroneal Motor (Ext Dig Brev)  Ankle    3.3 <6.0 5.6 >2.5 B Fib Ankle 5.1 28.0 55 >40  B Fib    8.4  5.0  Poplt B Fib 1.8 8.0 44 >40  Poplt    10.2  4.8         Left Tibial Motor (Abd Hall Brev)  Ankle    3.7 <6.0 10.0 >4 Knee Ankle 6.7 34.0 51 >40  Knee    10.4  9.4          H Reflex Studies   NR H-Lat (ms) Lat Norm (ms) L-R H-Lat (ms)  Left Tibial (Gastroc)     32.11 <35    EMG   Side Muscle Ins Act Fibs Fasc Recrt Dur. Amp. Poly. Activation  Comment  Left AntTibialis Nml Nml Nml Nml Nml Nml Nml Nml N/A  Left Gastroc Nml Nml Nml Nml Nml Nml Nml Nml N/A  Left RectFemoris Nml Nml Nml Nml Nml Nml Nml Nml N/A  Left AdductorLong Nml Nml Nml Nml Nml Nml Nml Nml N/A  Left GluteusMed Nml Nml Nml Nml Nml Nml Nml Nml N/A      Waveforms:

## 2022-06-11 NOTE — Progress Notes (Signed)
Left leg nerve conduction study looks normal.  This is great news.

## 2022-06-12 ENCOUNTER — Ambulatory Visit
Admission: RE | Admit: 2022-06-12 | Discharge: 2022-06-12 | Disposition: A | Discharge status: Home / Self Care | Payer: BC Managed Care – PPO | Source: Ambulatory Visit | Attending: Family Medicine | Admitting: Family Medicine

## 2022-06-12 DIAGNOSIS — G8929 Other chronic pain: Secondary | ICD-10-CM

## 2022-06-12 DIAGNOSIS — M47814 Spondylosis without myelopathy or radiculopathy, thoracic region: Secondary | ICD-10-CM | POA: Diagnosis not present

## 2022-06-12 MED ORDER — TRIAMCINOLONE ACETONIDE 40 MG/ML IJ SUSP (RADIOLOGY)
60.0000 mg | Freq: Once | INTRAMUSCULAR | Status: AC
  Administered 2022-06-12: 60 mg via EPIDURAL

## 2022-06-12 MED ORDER — IOPAMIDOL (ISOVUE-M 300) INJECTION 61%
1.0000 mL | Freq: Once | INTRAMUSCULAR | Status: AC | PRN
Start: 2022-06-12 — End: 2022-06-12
  Administered 2022-06-12: 1 mL via EPIDURAL

## 2022-06-12 NOTE — Discharge Instructions (Signed)

## 2022-06-20 ENCOUNTER — Other Ambulatory Visit: Payer: Self-pay

## 2022-06-20 DIAGNOSIS — Z95828 Presence of other vascular implants and grafts: Secondary | ICD-10-CM

## 2022-06-25 ENCOUNTER — Telehealth: Payer: Self-pay

## 2022-06-25 IMAGING — CT CT HEAD W/O CM
4 series · 17 of 47 positions shown, 19 images · non-contrast
Comparison: 11/20/2021

CLINICAL DATA: Mental status changes



[Series 3: head without · axial · non-contrast · 0.43mm/px · z∈[-189,-69]mm · 7 of 33 slices shown, 9 images]
[im 5/33  brain]
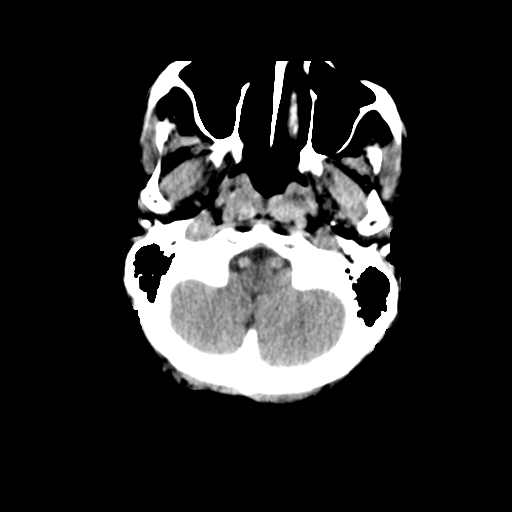
[im 5/33  bone]
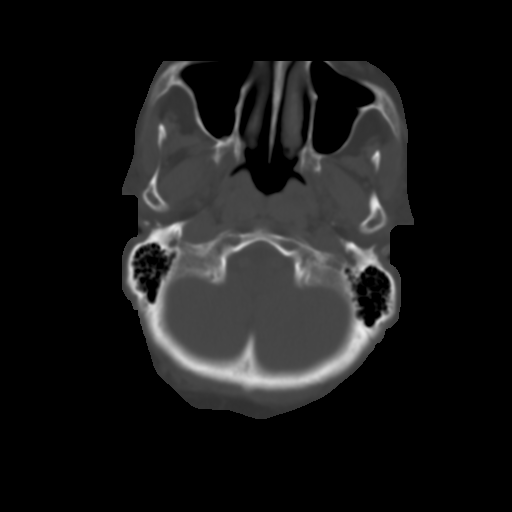
[im 9/33  brain]
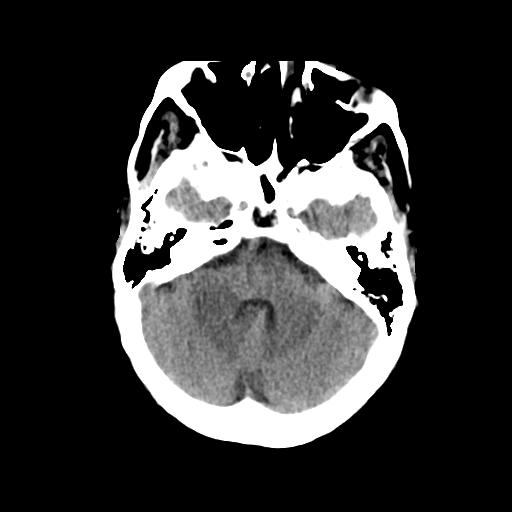
[im 13/33  brain]
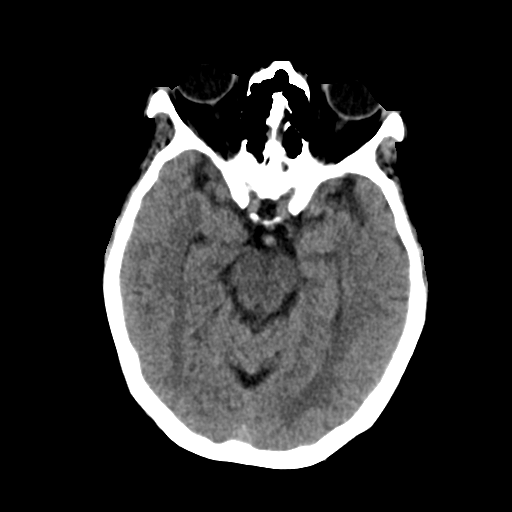
[im 17/33  brain]
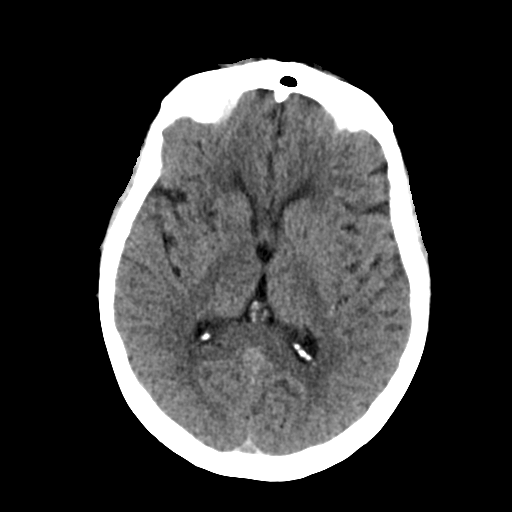
[im 21/33  brain]
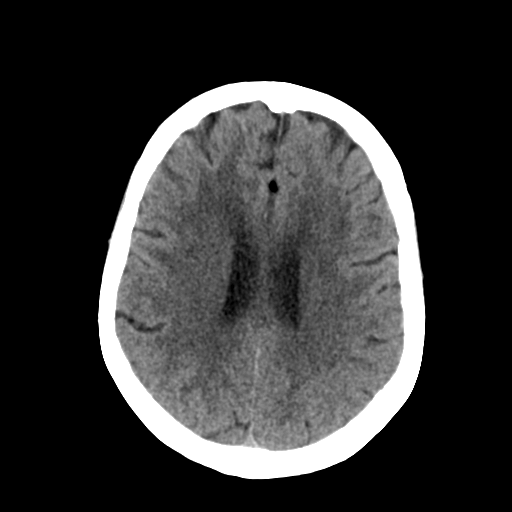
[im 21/33  bone]
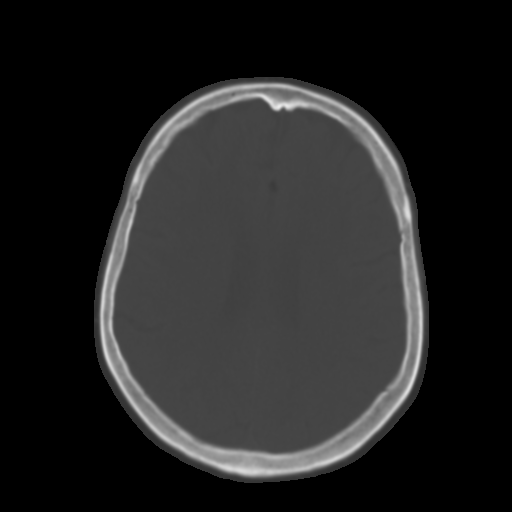
[im 25/33  brain]
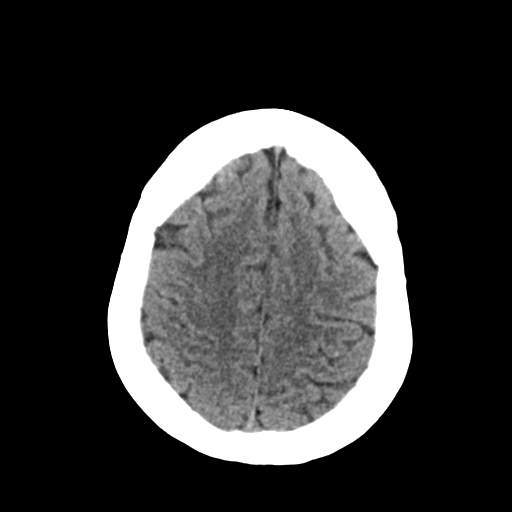
[im 29/33  brain]
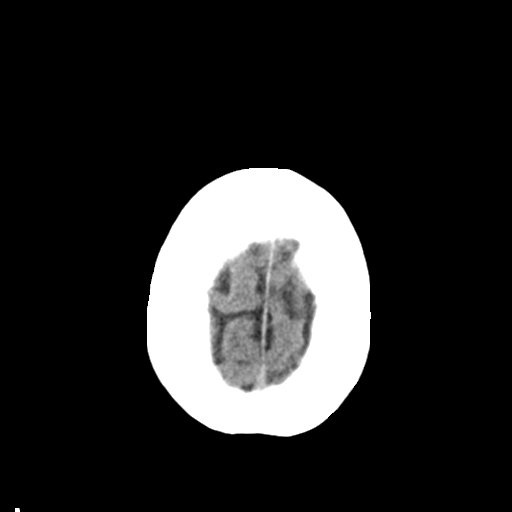

[Series 4: head bone · axial · 0.43mm/px · z∈[-193,-137]mm · 4 of 83 slices shown]
[im 9/83  bone]
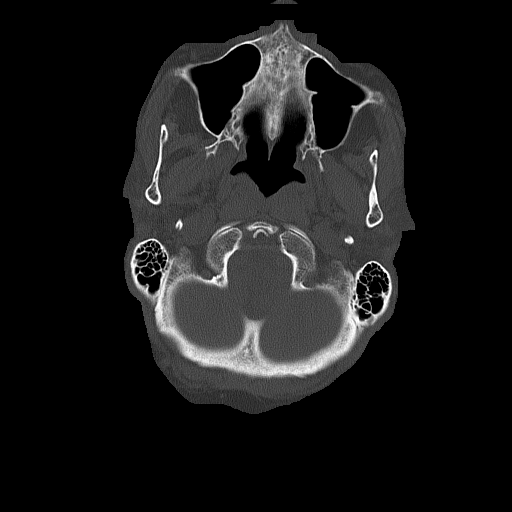
[im 17/83  bone]
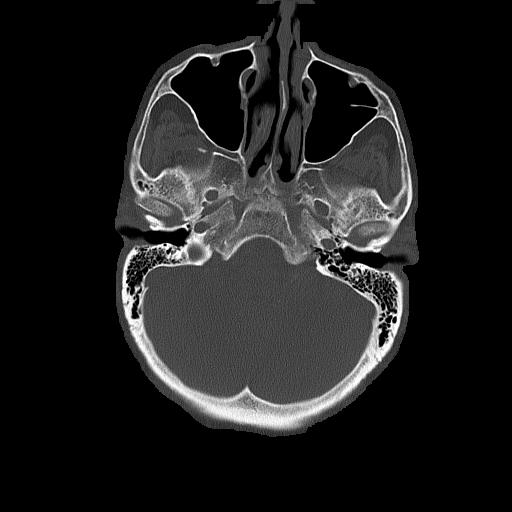
[im 25/83  bone]
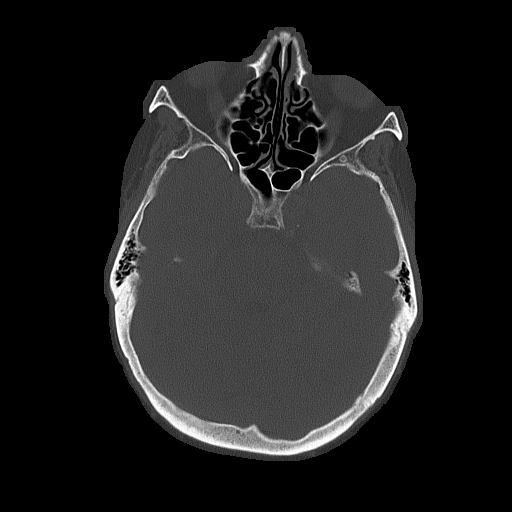
[im 37/83  bone]
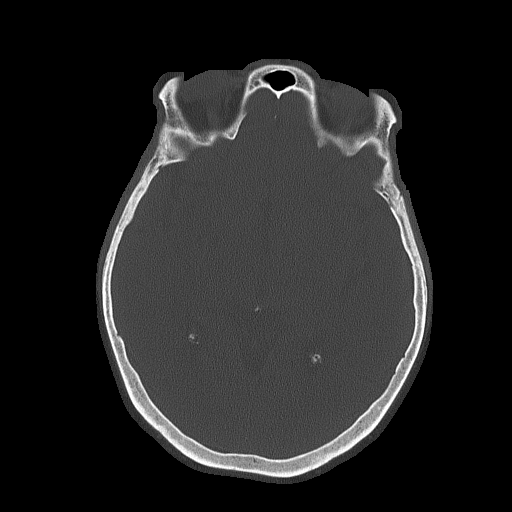

[Series 5: head without cor · coronal · non-contrast · 0.32mm/px · 3 of 71 slices shown]
[im 24/71  brain]
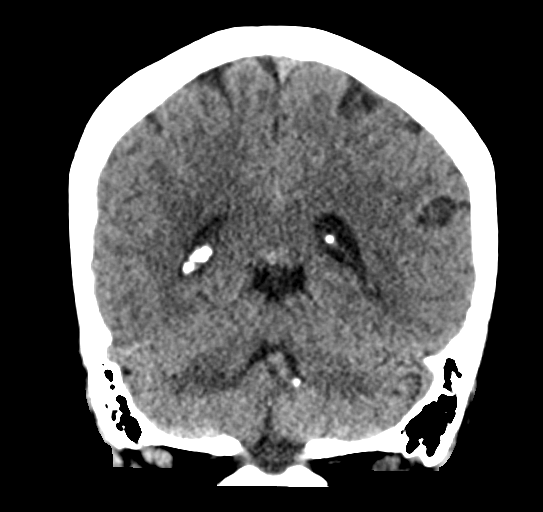
[im 32/71  brain]
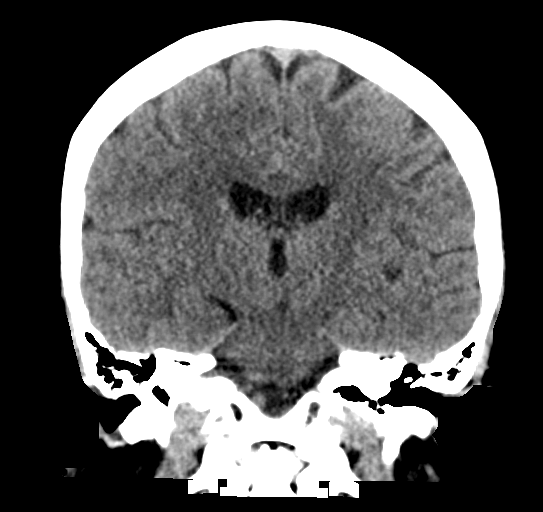
[im 39/71  brain]
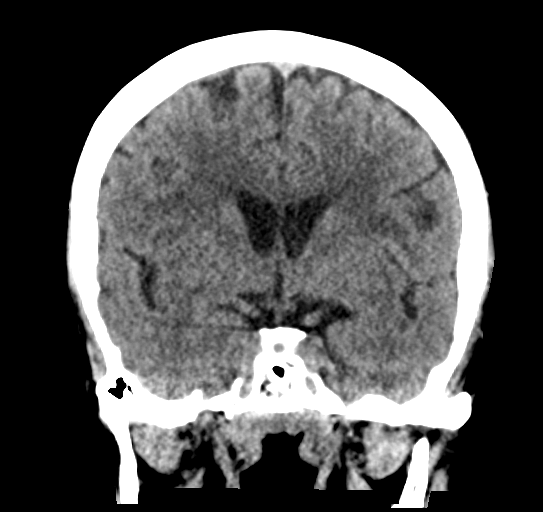

[Series 6: head without sag · sagittal · non-contrast · 0.35mm/px · 3 of 60 slices shown]
[im 20/60  brain]
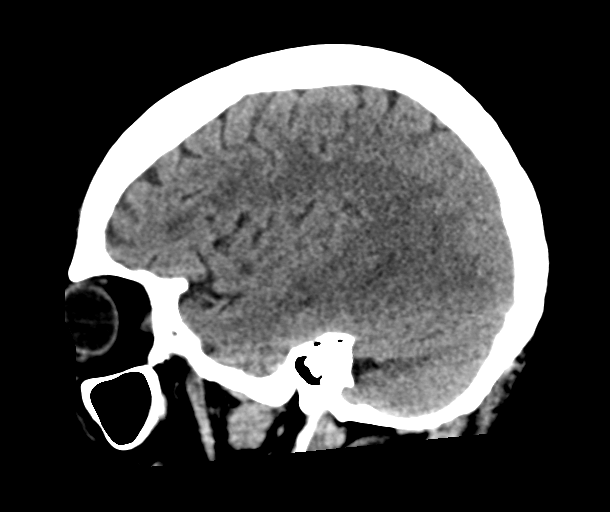
[im 30/60  brain]
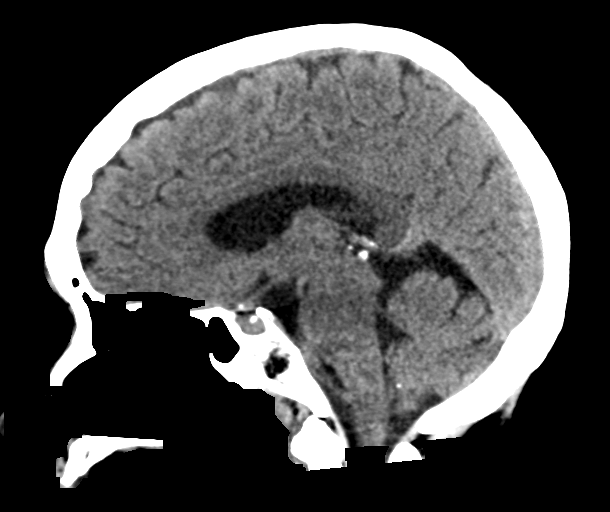
[im 40/60  brain]
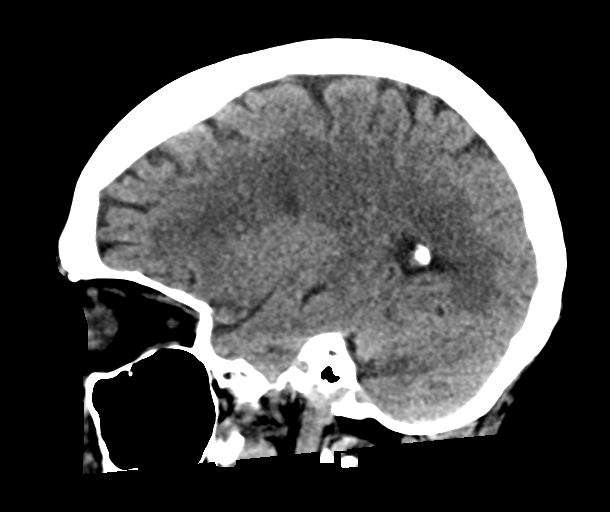

[17 of 47 positions shown; findings below may reference images not displayed]

FINDINGS: Brain: Patchy chronic small vessel disease throughout the deep white
matter. No acute intracranial abnormality. Specifically, no
hemorrhage, hydrocephalus, mass lesion, acute infarction, or
significant intracranial injury.

Vascular: No hyperdense vessel or unexpected calcification.

Skull: No acute calvarial abnormality.

Sinuses/Orbits: No acute findings

Other: None
IMPRESSION: Chronic small vessel disease.

No acute intracranial abnormality.

## 2022-06-25 NOTE — Telephone Encounter (Signed)
-----   Message from Riley Lam sent at 06/25/2022  9:44 AM EDT ----- Regarding: RE: CTA Scheduled pt needed Friday appt so it was moved to 9/15  ----- Message ----- From: Kaleen Mask, LPN Sent: 02/26/2923   8:05 PM EDT To: April H Pait; Cathlean Cower; # Subject: CTA                                            Please call patient to schedule at [facility].  Thanks.

## 2022-06-27 DIAGNOSIS — I7101 Dissection of ascending aorta: Secondary | ICD-10-CM | POA: Diagnosis not present

## 2022-07-04 ENCOUNTER — Encounter (HOSPITAL_COMMUNITY): Payer: Self-pay

## 2022-07-04 ENCOUNTER — Telehealth (HOSPITAL_COMMUNITY): Payer: Self-pay

## 2022-07-04 ENCOUNTER — Ambulatory Visit (HOSPITAL_COMMUNITY): Payer: BC Managed Care – PPO

## 2022-07-04 ENCOUNTER — Ambulatory Visit (HOSPITAL_COMMUNITY)
Admission: RE | Admit: 2022-07-04 | Discharge: 2022-07-04 | Disposition: A | Discharge status: Home / Self Care | Payer: BC Managed Care – PPO | Source: Ambulatory Visit | Attending: Vascular Surgery | Admitting: Vascular Surgery

## 2022-07-04 ENCOUNTER — Ambulatory Visit (HOSPITAL_COMMUNITY)
Admission: EM | Admit: 2022-07-04 | Discharge: 2022-07-04 | Disposition: A | Discharge status: Home / Self Care | Payer: BC Managed Care – PPO | Attending: Family Medicine | Admitting: Family Medicine

## 2022-07-04 DIAGNOSIS — J441 Chronic obstructive pulmonary disease with (acute) exacerbation: Secondary | ICD-10-CM

## 2022-07-04 DIAGNOSIS — Z1152 Encounter for screening for COVID-19: Secondary | ICD-10-CM | POA: Diagnosis not present

## 2022-07-04 DIAGNOSIS — Z95828 Presence of other vascular implants and grafts: Secondary | ICD-10-CM

## 2022-07-04 DIAGNOSIS — J22 Unspecified acute lower respiratory infection: Secondary | ICD-10-CM | POA: Diagnosis not present

## 2022-07-04 LAB — RESP PANEL BY RT-PCR (FLU A&B, COVID) ARPGX2
Influenza A by PCR: NEGATIVE
Influenza B by PCR: NEGATIVE
SARS Coronavirus 2 by RT PCR: NEGATIVE

## 2022-07-04 MED ORDER — DOXYCYCLINE HYCLATE 100 MG PO CAPS: 100.0000 mg | ORAL_CAPSULE | Freq: Two times a day (BID) | ORAL | 0 refills | Status: DC

## 2022-07-04 MED ORDER — PREDNISONE 20 MG PO TABS: 40.0000 mg | ORAL_TABLET | Freq: Every day | ORAL | 0 refills | Status: DC

## 2022-07-04 MED ORDER — BENZONATATE 100 MG PO CAPS: 100.0000 mg | ORAL_CAPSULE | Freq: Three times a day (TID) | ORAL | 0 refills | Status: DC

## 2022-07-04 NOTE — ED Triage Notes (Signed)
Pt states cough for the past week. States she had a CT scan scheduled today but was unable to have it done.

## 2022-07-04 NOTE — Discharge Instructions (Addendum)
Your COVID 19 / Flu results will be available in less than 24 hours. Negative results are immediately resulted to Mychart. Positive results will receive a follow-up call from our clinic, given your underlying conditions I do recommend Paxlovid. If symptoms are present, I recommend home quarantine until results are known.  If at any point any of your symptoms become severe and you have any difficulty breathing or chest pain go immediately to the emergency department.

## 2022-07-05 NOTE — ED Provider Notes (Signed)
[facility]    CSN: [id number] Arrival date & time: 07/04/22  1800      History   Chief Complaint Chief Complaint  Patient presents with   Cough    HPI Darlene Griffith is a 71 y.o. female.   HPI Patient with history of COPD, Hypertension, Aortic dissection,  presents today with one week history of cough, shortness of breath, and fatigue. Reports shortness of breath is chronic due to heart and lung conditions. She is unable to differentiate if her breathing has worsened. She denies fever and any known sick contacts. She reports cough is productive of thick sputum.  Denies any chest pain. Was scheduled today for CT angio and unable to complete procedure as IV team attempted to place IV access which blew while flushing with normal saline. Patient arm is wrapped in Coban and she continues to endorse mild tenderness at site. She is not currently actively bleeding.  Past Medical History:  Diagnosis Date   Chest pain    a. 01/2015 Echo: Nl LV fxn, Gr 1 DD, triv AI, mild MR.   Essential hypertension    Family history of lung cancer    Hepatic cyst    a. noted on CT 01/2015.   Hyperthyroidism    GOING TO [facility] FOR SECOND OPINION   Multinodular goiter    a. 01/2015 CT chest: multinodular goidter w/ substernal extension of the left lobe of the thyroid assoc w/ rightward deviation of tracheal air column.   Neck pain, chronic    Personal history of colonic polyps    Pulmonary nodules    a. 01/2015 CT Chest: RLL ~ 59m subpleural nodule - rec f/u in 6-12 mos.   Splenic cyst    a. noted on CT 01/2015.    Patient Active Problem List   Diagnosis Date Noted   COPD (chronic obstructive pulmonary disease) ([facility] 11/08/2021   AKI (acute kidney injury) ([facility]    Aortic dissection ([facility] 11/06/2021   Gastroesophageal reflux disease without esophagitis    Hyperthyroidism 04/19/2021   Snoring 03/02/2020   Tobacco use 03/02/2020   Genetic testing 02/10/2020   Family history of lung cancer     Personal history of colonic polyps    Dysphagia 01/06/2020   Cervical spondylosis with myelopathy and radiculopathy 07/05/2019   Neck pain, chronic 05/21/2019   Insomnia 07/27/2018   Multinodular goiter 07/14/2018   Chronic fatigue 06/15/2018   Midsternal chest pain 02/18/2015   Chest pain    Cough    Essential hypertension    Shortness of breath    Aortic stenosis    Pain in the chest    EKG abnormality    Malaise and fatigue    Lung nodule    Splenic cyst    Hepatic cyst    Hyperlipidemia    Hypertension 02/16/2015    Past Surgical History:  Procedure Laterality Date   BYPASS GRAFT AORTA TO AORTA Left 11/11/2021   Procedure: BYPASS GRAFT AORTA TO LEFT COMMON  FEMORAL ARTERY;  Surgeon: HCherre Robins MD;  [place] [facility]  Service: Vascular;  Laterality: Left;   CERVICAL DISC ARTHROPLASTY N/A 07/05/2019   Procedure: Cervical Six-Seven Artificial disc replacement;  Surgeon: EKristeen Miss MD;  [place] [facility]  Service: Neurosurgery;  Laterality: N/A;  Cervical Six-Seven Artificial disc replacement   CESAREAN SECTION     COLONOSCOPY  01/13/2020   THORACIC AORTIC ENDOVASCULAR STENT GRAFT N/A 11/11/2021   Procedure: THORACIC AORTIC ENDOVASCULAR STENT GRAFT WITH LEFT  BRACHIAL  ARTERY ACCESS;  Surgeon: Cherre Robins, MD;  Location[facility] OR;  Service: Vascular;  Laterality: N/A;   TONSILLECTOMY     AROUND 5-6 YRS OLD   ULTRASOUND GUIDANCE FOR VASCULAR ACCESS Bilateral 11/11/2021   Procedure: ULTRASOUND GUIDANCE FOR VASCULAR ACCESS;  Surgeon: Cherre Robins, MD;  Location: [facility] OR;  Service: Vascular;  Laterality: Bilateral;   UPPER GASTROINTESTINAL ENDOSCOPY  01/13/2020    OB History     Gravida      Para      Term      Preterm      AB      Living  4      SAB      IAB      Ectopic      Multiple      Live Births               Home Medications    Prior to Admission medications   Medication Sig Start Date End Date Taking? Authorizing Provider   acetaminophen (TYLENOL) 325 MG tablet Take 1-2 tablets (325-650 mg total) by mouth every 4 (four) hours as needed for mild pain. 12/03/21   Setzer, Edman Circle, PA-C  amLODipine (NORVASC) 10 MG tablet Take 1 tablet (10 mg total) by mouth daily. 03/18/22   Martinique, Betty G, MD  ascorbic acid (VITAMIN C) 1000 MG tablet Take 1 tablet (1,000 mg total) by mouth daily. 03/18/22   Martinique, Betty G, MD  atorvastatin (LIPITOR) 40 MG tablet Take 1 tablet (40 mg total) by mouth daily. 03/18/22   Martinique, Betty G, MD  benzonatate (TESSALON) 100 MG capsule Take 1 capsule (100 mg total) by mouth every 8 (eight) hours. 07/04/22   Scot Jun, FNP  carvedilol (COREG) 25 MG tablet Take 1 tablet (25 mg total) by mouth 2 (two) times daily with a meal. 03/18/22   Martinique, Betty G, MD  clotrimazole-betamethasone Donalynn Furlong) cream Apply to affected area 2 times daily prn 04/24/20   Raylene Everts, MD  diclofenac Sodium (VOLTAREN) 1 % GEL Apply 4 g topically 4 (four) times daily. 04/28/21   Deno Etienne, DO  doxycycline (VIBRAMYCIN) 100 MG capsule Take 1 capsule (100 mg total) by mouth 2 (two) times daily. 07/04/22   Scot Jun, FNP  DULoxetine (CYMBALTA) 30 MG capsule Take 1 capsule (30 mg total) by mouth at bedtime. 01/02/22   Raulkar, Clide Deutscher, MD  LORazepam (ATIVAN) 0.5 MG tablet 1-2 tabs 30 - 60 min prior to MRI. Do not drive with this medicine. 12/18/21   Gregor Hams, MD  losartan (COZAAR) 25 MG tablet Take 1 tablet (25 mg total) by mouth daily. 03/18/22   Martinique, Betty G, MD  methimazole (TAPAZOLE) 5 MG tablet Take 0.5 tablets (2.5 mg total) by mouth 3 (three) times daily. Keep appt with endocrinologist 03/28/22. 03/18/22   Martinique, Betty G, MD  [facility] 25 MG TB24 tablet Take 1 tablet by mouth once daily 08/12/21   Martinique, Betty G, MD  nitroGLYCERIN (NITROSTAT) 0.4 MG SL tablet Place 1 tablet (0.4 mg total) under the tongue every 5 (five) minutes as needed for chest pain. 12/25/21   Gregor Hams, MD  nitroGLYCERIN  (NITROSTAT) 0.4 MG SL tablet Place 1 tablet (0.4 mg total) under the tongue every 5 (five) minutes as needed for chest pain. May take up to 3 tabs in one hour and no more than 10 tablets in 24 hours. 03/19/22   Gregor Hams,  MD  pantoprazole (PROTONIX) 40 MG tablet Take 1 tablet (40 mg total) by mouth at bedtime. 03/18/22   Martinique, Betty G, MD  polyethylene glycol (MIRALAX / GLYCOLAX) 17 g packet Take 17 g by mouth daily as needed for mild constipation. 12/03/21   Setzer, Edman Circle, PA-C  predniSONE (DELTASONE) 20 MG tablet Take 2 tablets (40 mg total) by mouth daily with breakfast. 07/04/22   Scot Jun, FNP  prochlorperazine (COMPAZINE) 5 MG tablet Take 1-2 tablets (5-10 mg total) by mouth every 6 (six) hours as needed for nausea. 12/03/21   Setzer, Edman Circle, PA-C  senna (SENOKOT) 8.6 MG TABS tablet Take 2 tablets (17.2 mg total) by mouth at bedtime. 12/03/21   Setzer, Edman Circle, PA-C  Tiotropium Bromide Monohydrate (SPIRIVA RESPIMAT) 2.5 MCG/ACT AERS Inhale 2 puffs into the lungs daily. 06/08/20   Deneise Lever, MD  tiZANidine (ZANAFLEX) 4 MG tablet Take 1 tablet (4 mg total) by mouth every 6 (six) hours as needed for muscle spasms. 01/28/22   Gregor Hams, MD  traZODone (DESYREL) 50 MG tablet Take 0.5-1 tablets (25-50 mg total) by mouth at bedtime as needed for sleep. 12/25/21   Gregor Hams, MD    Family History Family History  Problem Relation Age of Onset   Heart attack Maternal Grandmother        deceased   High blood pressure Mother    High Cholesterol Mother    Thyroid disease Mother    Thyroid disease Sister    Lung cancer Father    Cancer Maternal Uncle        unk type   Cancer Sister        unk type   Colon cancer Neg Hx    Colon polyps Neg Hx    Esophageal cancer Neg Hx    Rectal cancer Neg Hx    Stomach cancer Neg Hx     Social History Social History   Tobacco Use   Smoking status: Light Smoker    Types: Cigarettes   Smokeless tobacco: Never   Tobacco comments:     1-2 NOT EVERY DAY   Vaping Use   Vaping Use: Never used  Substance Use Topics   Alcohol use: Yes    Alcohol/week: 0.0 standard drinks of alcohol    Comment: rare   Drug use: No     Allergies   Shellfish allergy, Gabapentin, Hm lidocaine patch [lidocaine], and Tape   Review of Systems Review of Systems Pertinent negatives listed in HPI   Physical Exam Triage Vital Signs ED Triage Vitals  Enc Vitals Group     BP 07/04/22 1830 (!) 145/93     Pulse Rate 07/04/22 1830 78     Resp 07/04/22 1830 16     Temp 07/04/22 1830 98.4 F (36.9 C)     Temp Source 07/04/22 1830 Oral     SpO2 07/04/22 1830 96 %     Weight --      Height --      Head Circumference --      Peak Flow --      Pain Score 07/04/22 1831 0     Pain Loc --      Pain Edu? --      Excl. in [place]? --    No data found.  Updated Vital Signs BP (!) 145/93 (BP Location: Right Arm)   Pulse 78   Temp 98.4 F (36.9 C) (Oral)   Resp 16  SpO2 96%   Visual Acuity Right Eye Distance:   Left Eye Distance:   Bilateral Distance:    Right Eye Near:   Left Eye Near:    Bilateral Near:     Physical Exam Constitutional:      Appearance: She is ill-appearing.  HENT:     Head: Normocephalic and atraumatic.     Nose: Congestion and rhinorrhea present.  Cardiovascular:     Rate and Rhythm: Normal rate and regular rhythm.  Pulmonary:     Effort: Pulmonary effort is normal.     Comments: Diminished lungs sound x all lung fields  Musculoskeletal:     Right forearm: Swelling and tenderness present.     Comments: Localized swelling AC and tenderness present, no drainage present.  Neurological:     Mental Status: She is alert and oriented to person, place, and time.     GCS: GCS eye subscore is 4. GCS verbal subscore is 5. GCS motor subscore is 6.     Sensory: Sensation is intact.     Motor: Motor function is intact.     Coordination: Coordination is intact.     Gait: Gait is intact.  Psychiatric:         Attention and Perception: Attention normal.        Mood and Affect: Mood normal.        Speech: Speech normal.        Behavior: Behavior normal.     UC Treatments / Results  Labs (all labs ordered are listed, but only abnormal results are displayed) Labs Reviewed  RESP PANEL BY RT-PCR (FLU A&B, COVID) [id number]    EKG   Radiology No results found.  Procedures Procedures (including critical care time)  Medications Ordered in UC Medications - No data to display  Initial Impression / Assessment and Plan / UC Course  I have reviewed the triage vital signs and the nursing notes.  Pertinent labs & imaging results that were available during my care of the patient were reviewed by me and considered in my medical decision making (see chart for details).    Treating patient for a lower respiratory infection and covering with Doxycycline, prednisone, treating cough with tessalon pearls. No crackles or wheezing present on exam, therefore imaging deferred and patient will be rescheduled for CT angiogram likely next week by vascular surgeon.  SPO 96%, stable. COVID/Flu pending. Strict ER/return precautions given. Patient verbalized understanding and agreement with plan. Final Clinical Impressions(s) / UC Diagnoses   Final diagnoses:  Acute lower respiratory infection  COPD exacerbation ([place])  Encounter for screening for COVID-19     Discharge Instructions      Your COVID 19 / Flu results will be available in less than 24 hours. Negative results are immediately resulted to Mychart. Positive results will receive a follow-up call from our clinic, given your underlying conditions I do recommend Paxlovid. If symptoms are present, I recommend home quarantine until results are known.  If at any point any of your symptoms become severe and you have any difficulty breathing or chest pain go immediately to the emergency department.   ED Prescriptions     Medication Sig Dispense Auth. Provider    doxycycline (VIBRAMYCIN) 100 MG capsule Take 1 capsule (100 mg total) by mouth 2 (two) times daily. 20 capsule Scot Jun, FNP   predniSONE (DELTASONE) 20 MG tablet Take 2 tablets (40 mg total) by mouth daily with breakfast. 10 tablet Scot Jun, FNP  benzonatate (TESSALON) 100 MG capsule Take 1 capsule (100 mg total) by mouth every 8 (eight) hours. 21 capsule Scot Jun, FNP      PDMP not reviewed this encounter.   Scot Jun, FNP 07/05/22 1640

## 2022-07-10 ENCOUNTER — Encounter (HOSPITAL_COMMUNITY): Payer: Self-pay | Admitting: *Deleted

## 2022-07-10 ENCOUNTER — Ambulatory Visit (HOSPITAL_COMMUNITY)
Admission: EM | Admit: 2022-07-10 | Discharge: 2022-07-10 | Disposition: A | Discharge status: Home / Self Care | Payer: BC Managed Care – PPO | Attending: Family Medicine | Admitting: Family Medicine

## 2022-07-10 ENCOUNTER — Ambulatory Visit (INDEPENDENT_AMBULATORY_CARE_PROVIDER_SITE_OTHER): Payer: BC Managed Care – PPO

## 2022-07-10 DIAGNOSIS — Z792 Long term (current) use of antibiotics: Secondary | ICD-10-CM | POA: Insufficient documentation

## 2022-07-10 DIAGNOSIS — R059 Cough, unspecified: Secondary | ICD-10-CM | POA: Diagnosis not present

## 2022-07-10 DIAGNOSIS — R062 Wheezing: Secondary | ICD-10-CM

## 2022-07-10 DIAGNOSIS — J0100 Acute maxillary sinusitis, unspecified: Secondary | ICD-10-CM | POA: Insufficient documentation

## 2022-07-10 DIAGNOSIS — Z20822 Contact with and (suspected) exposure to covid-19: Secondary | ICD-10-CM | POA: Insufficient documentation

## 2022-07-10 DIAGNOSIS — Z79899 Other long term (current) drug therapy: Secondary | ICD-10-CM | POA: Diagnosis not present

## 2022-07-10 LAB — RESP PANEL BY RT-PCR (RSV, FLU A&B, COVID)  RVPGX2
Influenza A by PCR: NEGATIVE
Influenza B by PCR: NEGATIVE
Resp Syncytial Virus by PCR: NEGATIVE
SARS Coronavirus 2 by RT PCR: NEGATIVE

## 2022-07-10 MED ORDER — CHERATUSSIN AC 100-10 MG/5ML PO SOLN: 5.0000 mL | Freq: Four times a day (QID) | ORAL | 0 refills | Status: DC | PRN

## 2022-07-10 MED ORDER — AMOXICILLIN-POT CLAVULANATE 875-125 MG PO TABS: 1.0000 | ORAL_TABLET | Freq: Two times a day (BID) | ORAL | 0 refills | Status: DC

## 2022-07-10 MED ORDER — AMOXICILLIN-POT CLAVULANATE 875-125 MG PO TABS: 1.0000 | ORAL_TABLET | Freq: Two times a day (BID) | ORAL | 0 refills | Status: AC

## 2022-07-10 NOTE — ED Triage Notes (Signed)
Pt came in to [facility] 07/04/2022 she has finished the meds given at that visit but she says she feels worse than she did then. She has cough and congestion. She is weak and could hardly get out of bed this morning.

## 2022-07-10 NOTE — ED Provider Notes (Signed)
[place]    CSN: [id number] Arrival date & time: 07/10/22  1934      History   Chief Complaint Chief Complaint  Patient presents with   Cough   Nasal Congestion    HPI Darlene Griffith is a 71 y.o. female.    Cough  Here for continued chest congestion and cough.  She is bringing up a good bit of phlegm.  She states she does not have wheezing and she does not use inhalers.  She is not aware that she has a diagnosis of COPD or asthma.  She has felt warm but is uncertain if she has had fever. She was seen here on September 15.  Flu and COVID swab was negative.  Imaging not done at that time.   She was prescribed doxycycline and prednisone and Tessalon Perles.  She does not feel improved at this time.  She requests RSV testing  Past Medical History:  Diagnosis Date   Chest pain    a. 01/2015 Echo: Nl LV fxn, Gr 1 DD, triv AI, mild MR.   Essential hypertension    Family history of lung cancer    Hepatic cyst    a. noted on CT 01/2015.   Hyperthyroidism    GOING TO [facility] FOR SECOND OPINION   Multinodular goiter    a. 01/2015 CT chest: multinodular goidter w/ substernal extension of the left lobe of the thyroid assoc w/ rightward deviation of tracheal air column.   Neck pain, chronic    Personal history of colonic polyps    Pulmonary nodules    a. 01/2015 CT Chest: RLL ~ 77m subpleural nodule - rec f/u in 6-12 mos.   Splenic cyst    a. noted on CT 01/2015.    Patient Active Problem List   Diagnosis Date Noted   COPD (chronic obstructive pulmonary disease) ([place] 11/08/2021   AKI (acute kidney injury) ([facility]    Aortic dissection ([facility] 11/06/2021   Gastroesophageal reflux disease without esophagitis    Hyperthyroidism 04/19/2021   Snoring 03/02/2020   Tobacco use 03/02/2020   Genetic testing 02/10/2020   Family history of lung cancer    Personal history of colonic polyps    Dysphagia 01/06/2020   Cervical spondylosis with myelopathy and radiculopathy  07/05/2019   Neck pain, chronic 05/21/2019   Insomnia 07/27/2018   Multinodular goiter 07/14/2018   Chronic fatigue 06/15/2018   Midsternal chest pain 02/18/2015   Chest pain    Cough    Essential hypertension    Shortness of breath    Aortic stenosis    Pain in the chest    EKG abnormality    Malaise and fatigue    Lung nodule    Splenic cyst    Hepatic cyst    Hyperlipidemia    Hypertension 02/16/2015    Past Surgical History:  Procedure Laterality Date   BYPASS GRAFT AORTA TO AORTA Left 11/11/2021   Procedure: BYPASS GRAFT AORTA TO LEFT COMMON  FEMORAL ARTERY;  Surgeon: HCherre Robins MD;  [place] [facility]  Service: Vascular;  Laterality: Left;   CERVICAL DISC ARTHROPLASTY N/A 07/05/2019   Procedure: Cervical Six-Seven Artificial disc replacement;  Surgeon: EKristeen Miss MD;  [place]  Service: Neurosurgery;  Laterality: N/A;  Cervical Six-Seven Artificial disc replacement   CESAREAN SECTION     COLONOSCOPY  01/13/2020   THORACIC AORTIC ENDOVASCULAR STENT GRAFT N/A 11/11/2021   Procedure: THORACIC AORTIC ENDOVASCULAR STENT GRAFT WITH LEFT BRACHIAL  ARTERY ACCESS;  Surgeon: Cherre Robins, MD;  Location: [facility] OR;  Service: Vascular;  Laterality: N/A;   TONSILLECTOMY     AROUND 5-6 YRS OLD   ULTRASOUND GUIDANCE FOR VASCULAR ACCESS Bilateral 11/11/2021   Procedure: ULTRASOUND GUIDANCE FOR VASCULAR ACCESS;  Surgeon: Cherre Robins, MD;  Location: [facility] OR;  Service: Vascular;  Laterality: Bilateral;   UPPER GASTROINTESTINAL ENDOSCOPY  01/13/2020    OB History     Gravida      Para      Term      Preterm      AB      Living  4      SAB      IAB      Ectopic      Multiple      Live Births               Home Medications    Prior to Admission medications   Medication Sig Start Date End Date Taking? Authorizing Provider  acetaminophen (TYLENOL) 325 MG tablet Take 1-2 tablets (325-650 mg total) by mouth every 4 (four) hours as needed for mild  pain. 12/03/21  Yes Setzer, Edman Circle, PA-C  amLODipine (NORVASC) 10 MG tablet Take 1 tablet (10 mg total) by mouth daily. 03/18/22  Yes Martinique, Betty G, MD  ascorbic acid (VITAMIN C) 1000 MG tablet Take 1 tablet (1,000 mg total) by mouth daily. 03/18/22  Yes Martinique, Betty G, MD  atorvastatin (LIPITOR) 40 MG tablet Take 1 tablet (40 mg total) by mouth daily. 03/18/22  Yes Martinique, Betty G, MD  carvedilol (COREG) 25 MG tablet Take 1 tablet (25 mg total) by mouth 2 (two) times daily with a meal. 03/18/22  Yes Martinique, Betty G, MD  clotrimazole-betamethasone (LOTRISONE) cream Apply to affected area 2 times daily prn 04/24/20  Yes Raylene Everts, MD  diclofenac Sodium (VOLTAREN) 1 % GEL Apply 4 g topically 4 (four) times daily. 04/28/21  Yes Deno Etienne, DO  DULoxetine (CYMBALTA) 30 MG capsule Take 1 capsule (30 mg total) by mouth at bedtime. 01/02/22  Yes Raulkar, Clide Deutscher, MD  LORazepam (ATIVAN) 0.5 MG tablet 1-2 tabs 30 - 60 min prior to MRI. Do not drive with this medicine. 12/18/21  Yes Gregor Hams, MD  losartan (COZAAR) 25 MG tablet Take 1 tablet (25 mg total) by mouth daily. 03/18/22  Yes Martinique, Betty G, MD  methimazole (TAPAZOLE) 5 MG tablet Take 0.5 tablets (2.5 mg total) by mouth 3 (three) times daily. Keep appt with endocrinologist 03/28/22. 03/18/22  Yes Martinique, Betty G, MD  MYRBETRIQ 25 MG TB24 tablet Take 1 tablet by mouth once daily 08/12/21  Yes Martinique, Betty G, MD  nitroGLYCERIN (NITROSTAT) 0.4 MG SL tablet Place 1 tablet (0.4 mg total) under the tongue every 5 (five) minutes as needed for chest pain. 12/25/21  Yes Gregor Hams, MD  nitroGLYCERIN (NITROSTAT) 0.4 MG SL tablet Place 1 tablet (0.4 mg total) under the tongue every 5 (five) minutes as needed for chest pain. May take up to 3 tabs in one hour and no more than 10 tablets in 24 hours. 03/19/22  Yes Gregor Hams, MD  pantoprazole (PROTONIX) 40 MG tablet Take 1 tablet (40 mg total) by mouth at bedtime. 03/18/22  Yes Martinique, Betty G, MD   polyethylene glycol (MIRALAX / GLYCOLAX) 17 g packet Take 17 g by mouth daily as needed for mild constipation. 12/03/21  Yes Setzer, Edman Circle, PA-C  prochlorperazine (COMPAZINE) 5 MG tablet Take 1-2 tablets (5-10 mg total) by mouth every 6 (six) hours as needed for nausea. 12/03/21  Yes Setzer, Edman Circle, PA-C  senna (SENOKOT) 8.6 MG TABS tablet Take 2 tablets (17.2 mg total) by mouth at bedtime. 12/03/21  Yes Setzer, Edman Circle, PA-C  Tiotropium Bromide Monohydrate (SPIRIVA RESPIMAT) 2.5 MCG/ACT AERS Inhale 2 puffs into the lungs daily. 06/08/20  Yes Young, Tarri Fuller D, MD  tiZANidine (ZANAFLEX) 4 MG tablet Take 1 tablet (4 mg total) by mouth every 6 (six) hours as needed for muscle spasms. 01/28/22  Yes Gregor Hams, MD  traZODone (DESYREL) 50 MG tablet Take 0.5-1 tablets (25-50 mg total) by mouth at bedtime as needed for sleep. 12/25/21  Yes Gregor Hams, MD  amoxicillin-clavulanate (AUGMENTIN) 875-125 MG tablet Take 1 tablet by mouth 2 (two) times daily for 7 days. 07/10/22 07/17/22  Barrett Henle, MD  guaiFENesin-codeine (CHERATUSSIN AC) 100-10 MG/5ML syrup Take 5 mLs by mouth 4 (four) times daily as needed for cough. 07/10/22   Barrett Henle, MD    Family History Family History  Problem Relation Age of Onset   Heart attack Maternal Grandmother        deceased   High blood pressure Mother    High Cholesterol Mother    Thyroid disease Mother    Thyroid disease Sister    Lung cancer Father    Cancer Maternal Uncle        unk type   Cancer Sister        unk type   Colon cancer Neg Hx    Colon polyps Neg Hx    Esophageal cancer Neg Hx    Rectal cancer Neg Hx    Stomach cancer Neg Hx     Social History Social History   Tobacco Use   Smoking status: Light Smoker    Types: Cigarettes   Smokeless tobacco: Never   Tobacco comments:    1-2 NOT EVERY DAY   Vaping Use   Vaping Use: Never used  Substance Use Topics   Alcohol use: Yes    Alcohol/week: 0.0 standard drinks of alcohol     Comment: rare   Drug use: No     Allergies   Shellfish allergy, Gabapentin, Hm lidocaine patch [lidocaine], and Tape   Review of Systems Review of Systems  Respiratory:  Positive for cough.      Physical Exam Triage Vital Signs ED Triage Vitals  Enc Vitals Group     BP 07/10/22 2034 121/76     Pulse Rate 07/10/22 2034 82     Resp 07/10/22 2034 18     Temp 07/10/22 2034 98.7 F (37.1 C)     Temp Source 07/10/22 2034 Oral     SpO2 07/10/22 2034 95 %     Weight --      Height --      Head Circumference --      Peak Flow --      Pain Score 07/10/22 2032 7     Pain Loc --      Pain Edu? --      Excl. in [place]? --    No data found.  Updated Vital Signs BP 121/76 (BP Location: Right Arm)   Pulse 82   Temp 98.7 F (37.1 C) (Oral)   Resp 18   SpO2 95%   Visual Acuity Right Eye Distance:   Left Eye Distance:   Bilateral Distance:  Right Eye Near:   Left Eye Near:    Bilateral Near:     Physical Exam Vitals reviewed.  Constitutional:      General: She is not in acute distress.    Appearance: She is not toxic-appearing.  HENT:     Right Ear: Tympanic membrane and ear canal normal.     Left Ear: Tympanic membrane and ear canal normal.     Nose: Nose normal.     Mouth/Throat:     Mouth: Mucous membranes are moist.     Pharynx: No oropharyngeal exudate or posterior oropharyngeal erythema.  Eyes:     Extraocular Movements: Extraocular movements intact.     Conjunctiva/sclera: Conjunctivae normal.     Pupils: Pupils are equal, round, and reactive to light.  Cardiovascular:     Rate and Rhythm: Normal rate and regular rhythm.     Heart sounds: No murmur heard. Pulmonary:     Effort: Pulmonary effort is normal. No respiratory distress.     Breath sounds: No stridor. No wheezing, rhonchi or rales.  Musculoskeletal:     Cervical back: Neck supple.  Lymphadenopathy:     Cervical: No cervical adenopathy.  Skin:    Capillary Refill: Capillary refill takes  less than 2 seconds.     Coloration: Skin is not jaundiced or pale.  Neurological:     General: No focal deficit present.     Mental Status: She is alert and oriented to person, place, and time.  Psychiatric:        Behavior: Behavior normal.      UC Treatments / Results  Labs (all labs ordered are listed, but only abnormal results are displayed) Labs Reviewed  RESP PANEL BY RT-PCR (RSV, FLU A&B, COVID)  [id number]    EKG   Radiology DG Chest 2 View  Result Date: 07/10/2022 CLINICAL DATA:  Cough and wheezing EXAM: CHEST - 2 VIEW COMPARISON:  01/27/2022 CT FINDINGS: Cardiac shadow is within normal limits. Changes consistent with prior stent graft are seen. Large intrathoracic goiter is again identified displacing the stent graft laterally. Lungs are well aerated bilaterally. Postsurgical changes in the cervical spine are seen. IMPRESSION: Stable appearing stent graft. Stable thoracic goiter. No acute abnormality noted. Electronically Signed   By: Inez Catalina M.D.   On: 07/10/2022 20:56    Procedures Procedures (including critical care time)  Medications Ordered in UC Medications - No data to display  Initial Impression / Assessment and Plan / UC Course  I have reviewed the triage vital signs and the nursing notes.  Pertinent labs & imaging results that were available during my care of the patient were reviewed by me and considered in my medical decision making (see chart for details).    Her chest x-ray is clear and aortic graft is stable in appearance.  When the doing a antibiotic of Augmentin for possible sinus infection.  She is also given codeine cough syrup, as her main concern is how hard she is coughing and if that might disrupt her aortic graft.  If she is not improving, I have asked her to please follow-up with her primary care Final Clinical Impressions(s) / UC Diagnoses   Final diagnoses:  Acute maxillary sinusitis, recurrence not specified     Discharge  Instructions      Chest x-ray is clear and does not show pneumonia  Take amoxicillin-clavulanate 875 mg--1 tab twice daily with food for 7 days  Codeine cough syrup--take 5 mL or 1 teaspoon by  mouth 4 times daily as needed for cough.     ED Prescriptions     Medication Sig Dispense Auth. Provider   guaiFENesin-codeine (CHERATUSSIN AC) 100-10 MG/5ML syrup  (Status: Discontinued) Take 5 mLs by mouth 4 (four) times daily as needed for cough. 120 mL Barrett Henle, MD   amoxicillin-clavulanate (AUGMENTIN) 875-125 MG tablet  (Status: Discontinued) Take 1 tablet by mouth 2 (two) times daily for 7 days. 14 tablet Barrett Henle, MD   amoxicillin-clavulanate (AUGMENTIN) 875-125 MG tablet Take 1 tablet by mouth 2 (two) times daily for 7 days. 14 tablet Alpa Salvo, Gwenlyn Perking, MD   guaiFENesin-codeine (CHERATUSSIN AC) 100-10 MG/5ML syrup Take 5 mLs by mouth 4 (four) times daily as needed for cough. 120 mL Barrett Henle, MD      I have reviewed the PDMP during this encounter.   Barrett Henle, MD 07/15/22 [phone]

## 2022-07-10 NOTE — Discharge Instructions (Addendum)
Chest x-ray is clear and does not show pneumonia  Take amoxicillin-clavulanate 875 mg--1 tab twice daily with food for 7 days  Codeine cough syrup--take 5 mL or 1 teaspoon by mouth 4 times daily as needed for cough.

## 2022-07-14 ENCOUNTER — Ambulatory Visit (HOSPITAL_COMMUNITY): Payer: BC Managed Care – PPO

## 2022-07-14 ENCOUNTER — Ambulatory Visit (HOSPITAL_COMMUNITY)
Admission: RE | Admit: 2022-07-14 | Discharge: 2022-07-14 | Disposition: A | Discharge status: Home / Self Care | Payer: BC Managed Care – PPO | Source: Ambulatory Visit | Attending: Vascular Surgery | Admitting: Vascular Surgery

## 2022-07-14 DIAGNOSIS — Z95828 Presence of other vascular implants and grafts: Secondary | ICD-10-CM | POA: Diagnosis not present

## 2022-07-14 DIAGNOSIS — I771 Stricture of artery: Secondary | ICD-10-CM | POA: Diagnosis not present

## 2022-07-14 DIAGNOSIS — T82858A Stenosis of vascular prosthetic devices, implants and grafts, initial encounter: Secondary | ICD-10-CM | POA: Diagnosis not present

## 2022-07-14 DIAGNOSIS — I7772 Dissection of iliac artery: Secondary | ICD-10-CM | POA: Diagnosis not present

## 2022-07-14 DIAGNOSIS — I7102 Dissection of abdominal aorta: Secondary | ICD-10-CM | POA: Diagnosis not present

## 2022-07-14 DIAGNOSIS — K5730 Diverticulosis of large intestine without perforation or abscess without bleeding: Secondary | ICD-10-CM | POA: Diagnosis not present

## 2022-07-14 MED ORDER — IOHEXOL 350 MG/ML SOLN
100.0000 mL | Freq: Once | INTRAVENOUS | Status: AC | PRN
  Administered 2022-07-14: 100 mL via INTRAVENOUS

## 2022-07-15 ENCOUNTER — Ambulatory Visit (INDEPENDENT_AMBULATORY_CARE_PROVIDER_SITE_OTHER): Payer: BC Managed Care – PPO | Admitting: Vascular Surgery

## 2022-07-15 ENCOUNTER — Encounter: Payer: Self-pay | Admitting: Vascular Surgery

## 2022-07-15 VITALS — BP 120/75 | HR 80 | Temp 98.1°F | Resp 20 | Ht 60.0 in | Wt 144.0 lb

## 2022-07-15 DIAGNOSIS — Z95828 Presence of other vascular implants and grafts: Secondary | ICD-10-CM | POA: Diagnosis not present

## 2022-07-15 MED ORDER — NITROGLYCERIN 0.4 MG SL SUBL: 0.4000 mg | SUBLINGUAL_TABLET | SUBLINGUAL | 0 refills | Status: DC | PRN

## 2022-07-15 NOTE — Progress Notes (Signed)
[facility] [place] PROGRESS NOTE  ASSESSMENT / PLAN: Darlene Griffith is a 71 y.o. female status post  :  1) bilateral ultrasound-guided common femoral artery access 2) left brachial artery exposure and percutaneous access 3) thoracic branched endovascular aortic repair (see below for device details) 4) left common and external iliac artery stenting (11 x 56m VBX x2) 5) left common iliac to common femoral artery bypass with 8 mm externally supported PTFE  For symptomatic type b aortic dissection having failed conservative management on 11/11/21. Her intraoperative course was complicated by iliac artery rupture requiring retroperitoneal exposure of the left iliac vessels and a left common femoral to common femoral bypass. Her postoperative course was marked by persistent pain and difficulty mobilizing. She otherwise did remarkably well.   She has multiple complaints today, but mostly is bothered by chronic cough.  She has no lower extremity symptoms.  She reports she is continuing to take nitroglycerin tablets for chest discomfort.  He has not seen otolaryngology for thyroid nodule.  She would like to to discuss her abdominal wall wall incisional hernia with general surgeon.  We will make the above referrals.  I will see her again in 2 to 3 years with a repeat CT scan of her chest abdomen pelvis  SUBJECTIVE: Patient returns to clinic.  Multiple complaints.  She has been bothered by chronic cough for some time.  She has had a work-up which was negative for COVID, influenza, RSV, and pneumonia.  She reports she is continue to take nitroglycerin for chest discomfort.  She does not have any lower extremity discomfort.  She has not yet seen ENT for thyroid nodule seen on imaging studies.  She would like to discuss the abdominal wall hernia with a general surgeon.  OBJECTIVE: BP 120/75 (BP Location: Left Arm, Patient Position: Sitting, Cuff Size: Normal)   Pulse 80   Temp  98.1 F (36.7 C)   Resp 20   Ht 5' (1.524 m)   Wt 144 lb (65.3 kg)   SpO2 94%   BMI 28.12 kg/m   Constitutional: No distress CNS: No focal deficits. Cardiac: Regular rate and rhythm. Pulmonary: Unlabored breathing Abdomen: Soft, nontender abdomen.  Abdominal incisions healing well.  Broad-based abdominal wall hernia Vascular: 2+ radial pulses.  2+ dorsalis pedis pulses.       Latest Ref Rng & Units 12/13/2021   11:05 AM 11/28/2021    5:11 AM 11/22/2021    6:39 AM  CBC  WBC 4.0 - 10.5 K/uL 8.8  10.3  10.7   Hemoglobin 12.0 - 15.0 g/dL 11.3  10.8  9.2   Hematocrit 36.0 - 46.0 % 34.0  33.2  29.0   Platelets 150.0 - 400.0 K/uL 439.0  188  111         Latest Ref Rng & Units 12/13/2021   11:05 AM 11/28/2021    5:11 AM 11/22/2021    6:39 AM  CMP  Glucose 70 - 99 mg/dL 86  111  90   BUN 6 - 23 mg/dL '9  14  13   '$ Creatinine 0.40 - 1.20 mg/dL 0.42  0.48  0.47   Sodium 135 - 145 mEq/L 137  138  137   Potassium 3.5 - 5.1 mEq/L 3.6  4.1  3.8   Chloride 96 - 112 mEq/L 99  103  101   CO2 19 - 32 mEq/L 32  24  27   Calcium 8.4 - 10.5 mg/dL 10.3  9.6  8.6   Total Protein 6.0 - 8.3 g/dL 8.3   6.0   Total Bilirubin 0.2 - 1.2 mg/dL 0.3   0.6   Alkaline Phos 39 - 117 U/L 115   77   AST 0 - 37 U/L 16   53   ALT 0 - 35 U/L 16   44     CrCl cannot be calculated (Patient's most recent lab result is older than the maximum 21 days allowed.).   CT angiogram personally reviewed in detail.  Excellent remodeling continues in the thoracic aorta.  The left subclavian artery stent is patent.  The left aortofemoral bypass is patent.  There is some distal stenosis, which seems unchanged from prior scans.  She has evidence of a broad-based abdominal wall hernia in her left lower quadrant.  Darlene Griffith. Stanford Breed, MD [facility] Phone Number: [phone] 07/15/2022 3:11 PM

## 2022-07-22 ENCOUNTER — Telehealth: Payer: Self-pay | Admitting: Family Medicine

## 2022-07-22 DIAGNOSIS — E042 Nontoxic multinodular goiter: Secondary | ICD-10-CM

## 2022-07-22 DIAGNOSIS — E0590 Thyrotoxicosis, unspecified without thyrotoxic crisis or storm: Secondary | ICD-10-CM

## 2022-07-22 NOTE — Telephone Encounter (Signed)
Is there anything else you need drawn besides the TSH?

## 2022-07-22 NOTE — Telephone Encounter (Signed)
Pt's daughter calling regarding labs that need to be drawn, discussed at previousn appointment . Pt does not want to have labs done here, wants labs drawn at [place]

## 2022-07-22 NOTE — Telephone Encounter (Signed)
She needs to see endocrinologist, I am not managing her hyperthyroidism. Thanks, BJ

## 2022-07-23 NOTE — Addendum Note (Signed)
Addended by: Nathanial Millman E on: 07/23/2022 11:46 AM   Modules accepted: Orders

## 2022-07-23 NOTE — Telephone Encounter (Signed)
I called and spoke with patient's daughter. She would like referral to go to the endocrinologist that she sees. Referral placed to [facility] at [facility].

## 2022-08-06 NOTE — Progress Notes (Deleted)
HPI:  [name] is a 71 y.o. female, who is here today to follow on medication.  Review of Systems Rest see pertinent positives and negatives per HPI.  Current Outpatient Medications on File Prior to Visit  Medication Sig Dispense Refill   acetaminophen (TYLENOL) 325 MG tablet Take 1-2 tablets (325-650 mg total) by mouth every 4 (four) hours as needed for mild pain.     amLODipine (NORVASC) 10 MG tablet Take 1 tablet (10 mg total) by mouth daily. 90 tablet 1   ascorbic acid (VITAMIN C) 1000 MG tablet Take 1 tablet (1,000 mg total) by mouth daily. 30 tablet 0   atorvastatin (LIPITOR) 40 MG tablet Take 1 tablet (40 mg total) by mouth daily. 90 tablet 2   carvedilol (COREG) 25 MG tablet Take 1 tablet (25 mg total) by mouth 2 (two) times daily with a meal. 180 tablet 1   clotrimazole-betamethasone (LOTRISONE) cream Apply to affected area 2 times daily prn 15 g 0   diclofenac Sodium (VOLTAREN) 1 % GEL Apply 4 g topically 4 (four) times daily. 100 g 0   DULoxetine (CYMBALTA) 30 MG capsule Take 1 capsule (30 mg total) by mouth at bedtime. 30 capsule 1   guaiFENesin-codeine (CHERATUSSIN AC) 100-10 MG/5ML syrup Take 5 mLs by mouth 4 (four) times daily as needed for cough. 120 mL 0   LORazepam (ATIVAN) 0.5 MG tablet 1-2 tabs 30 - 60 min prior to MRI. Do not drive with this medicine. 4 tablet 0   losartan (COZAAR) 25 MG tablet Take 1 tablet (25 mg total) by mouth daily. 90 tablet 1   methimazole (TAPAZOLE) 5 MG tablet Take 0.5 tablets (2.5 mg total) by mouth 3 (three) times daily. Keep appt with endocrinologist 03/28/22. 21 tablet 0   MYRBETRIQ 25 MG TB24 tablet Take 1 tablet by mouth once daily 30 tablet 0   nitroGLYCERIN (NITROSTAT) 0.4 MG SL tablet Place 1 tablet (0.4 mg total) under the tongue every 5 (five) minutes as needed for chest pain. 30 tablet 12   nitroGLYCERIN (NITROSTAT) 0.4 MG SL tablet Place 1 tablet (0.4 mg total) under the tongue every 5 (five) minutes as needed for chest  pain. May take up to 3 tabs in one hour and no more than 10 tablets in 24 hours. 30 tablet 0   pantoprazole (PROTONIX) 40 MG tablet Take 1 tablet (40 mg total) by mouth at bedtime. 90 tablet 2   polyethylene glycol (MIRALAX / GLYCOLAX) 17 g packet Take 17 g by mouth daily as needed for mild constipation. 14 each 0   prochlorperazine (COMPAZINE) 5 MG tablet Take 1-2 tablets (5-10 mg total) by mouth every 6 (six) hours as needed for nausea. 30 tablet 0   senna (SENOKOT) 8.6 MG TABS tablet Take 2 tablets (17.2 mg total) by mouth at bedtime. 60 tablet 0   Tiotropium Bromide Monohydrate (SPIRIVA RESPIMAT) 2.5 MCG/ACT AERS Inhale 2 puffs into the lungs daily. 4 g 0   tiZANidine (ZANAFLEX) 4 MG tablet Take 1 tablet (4 mg total) by mouth every 6 (six) hours as needed for muscle spasms. 30 tablet 1   traZODone (DESYREL) 50 MG tablet Take 0.5-1 tablets (25-50 mg total) by mouth at bedtime as needed for sleep. 20 tablet 0   No current facility-administered medications on file prior to visit.    Past Medical History:  Diagnosis Date   Chest pain    a. 01/2015 Echo: Nl LV fxn, Gr 1 DD, triv AI,  mild MR.   Essential hypertension    Family history of lung cancer    Hepatic cyst    a. noted on CT 01/2015.   Hyperthyroidism    GOING TO [facility] FOR SECOND OPINION   Multinodular goiter    a. 01/2015 CT chest: multinodular goidter w/ substernal extension of the left lobe of the thyroid assoc w/ rightward deviation of tracheal air column.   Neck pain, chronic    Personal history of colonic polyps    Pulmonary nodules    a. 01/2015 CT Chest: RLL ~ 63m subpleural nodule - rec f/u in 6-12 mos.   Splenic cyst    a. noted on CT 01/2015.   Allergies  Allergen Reactions   Shellfish Allergy Anaphylaxis   Gabapentin     Other reaction(s): Confusion (intolerance)   Hm Lidocaine Patch [Lidocaine] Other (See Comments)    Lidocaine patches caused burning   Tape Other (See Comments)    Pulls skin    Social History    Socioeconomic History   Marital status: Single    Spouse name: Not on file   Number of children: Not on file   Years of education: Not on file   Highest education level: Not on file  Occupational History   Occupation: retired  Tobacco Use   Smoking status: Light Smoker    Types: Cigarettes   Smokeless tobacco: Never   Tobacco comments:    1-2 NOT EVERY DAY   Vaping Use   Vaping Use: Never used  Substance and Sexual Activity   Alcohol use: Yes    Alcohol/week: 0.0 standard drinks of alcohol    Comment: rare   Drug use: No   Sexual activity: Not Currently  Other Topics Concern   Not on file  Social History Narrative   Not on file   Social Determinants of Health   Financial Resource Strain: Low Risk  (12/19/2021)   Overall Financial Resource Strain (CARDIA)    Difficulty of Paying Living Expenses: Not hard at all  Food Insecurity: No Food Insecurity (12/19/2021)   Hunger Vital Sign    Worried About Running Out of Food in the Last Year: Never true    Ran Out of Food in the Last Year: Never true  Transportation Needs: No Transportation Needs (12/19/2021)   PRAPARE - THydrologist(Medical): No    Lack of Transportation (Non-Medical): No  Physical Activity: Inactive (12/19/2021)   Exercise Vital Sign    Days of Exercise per Week: 0 days    Minutes of Exercise per Session: 0 min  Stress: No Stress Concern Present (12/19/2021)   [place]   Feeling of Stress : Not at all  Social Connections: Moderately Integrated (12/19/2021)   Social Connection and Isolation Panel [NHANES]    Frequency of Communication with Friends and Family: More than three times a week    Frequency of Social Gatherings with Friends and Family: More than three times a week    Attends Religious Services: More than 4 times per year    Active Member of [employer] Organizations: Yes    Attends CArchivist Meetings: 1 to 4 times per year    Marital Status: Never married    There were no vitals filed for this visit. There is no height or weight on file to calculate BMI.  Physical Exam  ASSESSMENT AND PLAN:   There are no diagnoses  linked to this encounter.  No orders of the defined types were placed in this encounter.   No problem-specific Assessment & Plan notes found for this encounter.   No follow-ups on file.   Betty G. Martinique, MD  [facility]. [place] office.

## 2022-08-08 ENCOUNTER — Telehealth: Payer: Self-pay | Admitting: Family Medicine

## 2022-08-08 ENCOUNTER — Ambulatory Visit: Payer: BC Managed Care – PPO | Admitting: Family Medicine

## 2022-08-08 DIAGNOSIS — G8929 Other chronic pain: Secondary | ICD-10-CM

## 2022-08-08 NOTE — Telephone Encounter (Signed)
Pt having mid back pain again, called to ask Dr. Georgina Snell to send an order to repeat injections at [place].  She stated it was her mid back/between shoulder blades, not lumbar.

## 2022-08-08 NOTE — Telephone Encounter (Signed)
Repeat Tspine ESI ordered. I called Ednah and confirmed the location and technique.  She went out of her way to make sure she expresses her appreciation to the entire team here at [facility] from the front desk to athletic trainers to the providers.

## 2022-08-12 ENCOUNTER — Ambulatory Visit
Admission: RE | Admit: 2022-08-12 | Discharge: 2022-08-12 | Disposition: A | Discharge status: Home / Self Care | Payer: BC Managed Care – PPO | Source: Ambulatory Visit | Attending: Family Medicine | Admitting: Family Medicine

## 2022-08-12 DIAGNOSIS — M546 Pain in thoracic spine: Secondary | ICD-10-CM | POA: Diagnosis not present

## 2022-08-12 DIAGNOSIS — G8929 Other chronic pain: Secondary | ICD-10-CM

## 2022-08-12 MED ORDER — TRIAMCINOLONE ACETONIDE 40 MG/ML IJ SUSP (RADIOLOGY)
60.0000 mg | Freq: Once | INTRAMUSCULAR | Status: AC
  Administered 2022-08-12: 60 mg via EPIDURAL

## 2022-08-12 MED ORDER — IOPAMIDOL (ISOVUE-M 300) INJECTION 61%
1.0000 mL | Freq: Once | INTRAMUSCULAR | Status: AC
  Administered 2022-08-12: 1 mL via EPIDURAL

## 2022-08-12 NOTE — Discharge Instructions (Signed)

## 2022-08-13 ENCOUNTER — Other Ambulatory Visit: Payer: Self-pay | Admitting: Vascular Surgery

## 2022-08-24 ENCOUNTER — Ambulatory Visit
Admission: EM | Admit: 2022-08-24 | Discharge: 2022-08-24 | Disposition: A | Discharge status: Home / Self Care | Payer: BC Managed Care – PPO | Attending: Internal Medicine | Admitting: Internal Medicine

## 2022-08-24 ENCOUNTER — Encounter (HOSPITAL_COMMUNITY): Payer: Self-pay

## 2022-08-24 ENCOUNTER — Emergency Department (HOSPITAL_COMMUNITY)
Admission: EM | Admit: 2022-08-24 | Discharge: 2022-08-24 | Discharge status: Against Medical Advice | Payer: BC Managed Care – PPO | Attending: Emergency Medicine | Admitting: Emergency Medicine

## 2022-08-24 ENCOUNTER — Emergency Department (HOSPITAL_COMMUNITY): Payer: BC Managed Care – PPO

## 2022-08-24 ENCOUNTER — Encounter: Payer: Self-pay | Admitting: Emergency Medicine

## 2022-08-24 DIAGNOSIS — Z5321 Procedure and treatment not carried out due to patient leaving prior to being seen by health care provider: Secondary | ICD-10-CM | POA: Diagnosis not present

## 2022-08-24 DIAGNOSIS — U071 COVID-19: Secondary | ICD-10-CM | POA: Diagnosis not present

## 2022-08-24 DIAGNOSIS — R079 Chest pain, unspecified: Secondary | ICD-10-CM | POA: Diagnosis not present

## 2022-08-24 DIAGNOSIS — R059 Cough, unspecified: Secondary | ICD-10-CM | POA: Diagnosis not present

## 2022-08-24 DIAGNOSIS — B349 Viral infection, unspecified: Secondary | ICD-10-CM

## 2022-08-24 DIAGNOSIS — R7981 Abnormal blood-gas level: Secondary | ICD-10-CM

## 2022-08-24 DIAGNOSIS — R0789 Other chest pain: Secondary | ICD-10-CM | POA: Diagnosis not present

## 2022-08-24 LAB — CBC
HCT: 41.3 % (ref 36.0–46.0)
Hemoglobin: 12.9 g/dL (ref 12.0–15.0)
MCH: 34.3 pg — ABNORMAL HIGH (ref 26.0–34.0)
MCHC: 31.2 g/dL (ref 30.0–36.0)
MCV: 109.8 fL — ABNORMAL HIGH (ref 80.0–100.0)
Platelets: 234 10*3/uL (ref 150–400)
RBC: 3.76 MIL/uL — ABNORMAL LOW (ref 3.87–5.11)
RDW: 12.2 % (ref 11.5–15.5)
WBC: 8.7 10*3/uL (ref 4.0–10.5)
nRBC: 0 % (ref 0.0–0.2)

## 2022-08-24 LAB — TROPONIN I (HIGH SENSITIVITY): Troponin I (High Sensitivity): 4 ng/L (ref <18)

## 2022-08-24 LAB — BASIC METABOLIC PANEL
Anion gap: 9 (ref 5–15)
BUN: 11 mg/dL (ref 8–23)
CO2: 25 mmol/L (ref 22–32)
Calcium: 9 mg/dL (ref 8.9–10.3)
Chloride: 105 mmol/L (ref 98–111)
Creatinine, Ser: 0.54 mg/dL (ref 0.44–1.00)
GFR, Estimated: >60 mL/min (ref >60)
Glucose, Bld: 119 mg/dL — ABNORMAL HIGH (ref 70–99)
Potassium: 3.4 mmol/L — ABNORMAL LOW (ref 3.5–5.1)
Sodium: 139 mmol/L (ref 135–145)

## 2022-08-24 LAB — RESP PANEL BY RT-PCR (FLU A&B, COVID) ARPGX2
Influenza A by PCR: NEGATIVE
Influenza B by PCR: NEGATIVE
SARS Coronavirus 2 by RT PCR: POSITIVE — AB

## 2022-08-24 MED ORDER — ALBUTEROL SULFATE (2.5 MG/3ML) 0.083% IN NEBU
2.5000 mg | INHALATION_SOLUTION | Freq: Once | RESPIRATORY_TRACT | Status: AC
  Administered 2022-08-24: 2.5 mg via RESPIRATORY_TRACT

## 2022-08-24 NOTE — Discharge Instructions (Signed)
Go to the emergency department as soon as you leave urgent care for further evaluation and management. 

## 2022-08-24 NOTE — ED Provider Triage Note (Signed)
Emergency Medicine Provider Triage Evaluation Note  Darlene Griffith , a 71 y.o. female  was evaluated in triage.  Pt complains of chest pain.  Started 3 to 4 days ago, it comes and goes.  Feels like pins-and-needles.  She also feels short of breath.  Seen at urgent care, sent to ED because her sats dropped to 88 to 90% on room air..  Review of Systems  Per HPI  Physical Exam  BP 114/81 (BP Location: Left Arm)   Pulse (!) 103   Temp 99 F (37.2 C) (Oral)   Resp 20   Ht '5\' 1"'$  (1.549 m)   Wt 65.3 kg   SpO2 95%   BMI 27.21 kg/m  Gen:   Awake, no distress   Resp:  Normal effort  MSK:   Moves extremities without difficulty  Other:    Medical Decision Making  Medically screening exam initiated at 11:20 AM.  Appropriate orders placed.  Darlene Griffith was informed that the remainder of the evaluation will be completed by another provider, this initial triage assessment does not replace that evaluation, and the importance of remaining in the ED until their evaluation is complete.     Darlene Raring, PA-C 08/24/22 1121

## 2022-08-24 NOTE — ED Provider Notes (Signed)
[facility]    CSN: [id number] Arrival date & time: 08/24/22  0818      History   Chief Complaint Chief Complaint  Patient presents with   Cough   Generalized Body Aches    HPI Darlene Griffith is a 71 y.o. female.   Patient presents with cough, nasal congestion, sore throat, generalized body aches started about 4 days ago.  She does report shortness of breath but states that this is baseline for her given that she has a "heart condition".  Denies chest pain, ear pain, nausea, vomiting, diarrhea, abdominal pain.  Patient reports that she recently went on a cruise and her daughter tested positive for the flu.  She has taken multiple over-the-counter cold and flu medications with no improvement in symptoms.  Patient denies that she has ever had a formal diagnosis of asthma or COPD.   Cough   Past Medical History:  Diagnosis Date   Chest pain    a. 01/2015 Echo: Nl LV fxn, Gr 1 DD, triv AI, mild MR.   Essential hypertension    Family history of lung cancer    Hepatic cyst    a. noted on CT 01/2015.   Hyperthyroidism    GOING TO [facility] FOR SECOND OPINION   Multinodular goiter    a. 01/2015 CT chest: multinodular goidter w/ substernal extension of the left lobe of the thyroid assoc w/ rightward deviation of tracheal air column.   Neck pain, chronic    Personal history of colonic polyps    Pulmonary nodules    a. 01/2015 CT Chest: RLL ~ 19m subpleural nodule - rec f/u in 6-12 mos.   Splenic cyst    a. noted on CT 01/2015.    Patient Active Problem List   Diagnosis Date Noted   COPD (chronic obstructive pulmonary disease) ([place] 11/08/2021   AK[place]) [place]    [place] dissection ([facility] 11/06/2021   Gastroesophageal reflux disease without esophagitis    Hyperthyroidism 04/19/2021   Snoring 03/02/2020   Tobacco use 03/02/2020   Genetic testing 02/10/2020   Family history of lung cancer    Personal history of colonic polyps    Dysphagia 01/06/2020    Cervical spondylosis with myelopathy and radiculopathy 07/05/2019   Neck pain, chronic 05/21/2019   Insomnia 07/27/2018   Multinodular goiter 07/14/2018   Chronic fatigue 06/15/2018   Midsternal chest pain 02/18/2015   Chest pain    Cough    Essential hypertension    Shortness of breath    Aortic stenosis    Pain in the chest    EKG abnormality    Malaise and fatigue    Lung nodule    Splenic cyst    Hepatic cyst    Hyperlipidemia    Hypertension 02/16/2015    Past Surgical History:  Procedure Laterality Date   BYPASS GRAFT AORTA TO AORTA Left 11/11/2021   Procedure: BYPASS GRAFT AORTA TO LEFT COMMON  FEMORAL ARTERY;  Surgeon: HCherre Robins MD;  [place] [facility]  Service: V[place];  Laterality: Left;   CERVICAL DISC ARTHROPLASTY N/A 07/05/2019   Procedure: Cervical Six-Seven Artificial disc replacement;  Surgeon: EKristeen Miss MD;  [place] [facility]  Service: Neurosurgery;  Laterality: N/A;  Cervical Six-Seven Artificial disc replacement   CESAREAN SECTION     COLONOSCOPY  01/13/2020   THORACIC AORTIC ENDOVASCULAR STENT GRAFT N/A 11/11/2021   Procedure: THORACIC AORTIC ENDOVASCULAR STENT GRAFT WITH LEFT BRACHIAL  ARTERY ACCESS;  Surgeon: HStanford Breed  [name], MD;  Location: [facility];  Service: Vascular;  Laterality: N/A;   TONSILLECTOMY     AROUND 5-6 YRS OLD   ULTRASOUND GUIDANCE FOR VASCULAR ACCESS Bilateral 11/11/2021   Procedure: ULTRASOUND GUIDANCE FOR VASCULAR ACCESS;  Surgeon: Cherre Robins, MD;  Location: [facility];  Service: Vascular;  Laterality: Bilateral;   UPPER GASTROINTESTINAL ENDOSCOPY  01/13/2020    OB History     Gravida      Para      Term      Preterm      AB      Living  4      SAB      IAB      Ectopic      Multiple      Live Births               Home Medications    Prior to Admission medications   Medication Sig Start Date End Date Taking? Authorizing Provider  acetaminophen (TYLENOL) 325 MG tablet Take 1-2 tablets (325-650 mg  total) by mouth every 4 (four) hours as needed for mild pain. 12/03/21   Setzer, Edman Circle, PA-C  amLODipine (NORVASC) 10 MG tablet Take 1 tablet (10 mg total) by mouth daily. 03/18/22   Martinique, Betty G, MD  ascorbic acid (VITAMIN C) 1000 MG tablet Take 1 tablet (1,000 mg total) by mouth daily. 03/18/22   Martinique, Betty G, MD  atorvastatin (LIPITOR) 40 MG tablet Take 1 tablet (40 mg total) by mouth daily. 03/18/22   Martinique, Betty G, MD  carvedilol (COREG) 25 MG tablet Take 1 tablet (25 mg total) by mouth 2 (two) times daily with a meal. 03/18/22   [place], Betty G, MD  clotrimazole-betamethasone Donalynn Furlong) cream Apply to affected area 2 times daily prn 04/24/20   Raylene Everts, MD  diclofenac Sodium (VOLTAREN) 1 % GEL Apply 4 g topically 4 (four) times daily. 04/28/21   Deno Etienne, DO  DULoxetine (CYMBALTA) 30 MG capsule Take 1 capsule (30 mg total) by mouth at bedtime. 01/02/22   Raulkar, Clide Deutscher, MD  guaiFENesin-codeine (CHERATUSSIN AC) 100-10 MG/5ML syrup Take 5 mLs by mouth 4 (four) times daily as needed for cough. 07/10/22   Barrett Henle, MD  LORazepam (ATIVAN) 0.5 MG tablet 1-2 tabs 30 - 60 min prior to MRI. Do not drive with this medicine. 12/18/21   Gregor Hams, MD  losartan (COZAAR) 25 MG tablet Take 1 tablet (25 mg total) by mouth daily. 03/18/22   Martinique, Betty G, MD  methimazole (TAPAZOLE) 5 MG tablet Take 0.5 tablets (2.5 mg total) by mouth 3 (three) times daily. Keep appt with endocrinologist 03/28/22. 03/18/22   Martinique, Betty G, MD  [facility] 25 MG TB24 tablet Take 1 tablet by mouth once daily 08/12/21   [place], Betty G, MD  nitroGLYCERIN (NITROSTAT) 0.4 MG SL tablet Place 1 tablet (0.4 mg total) under the tongue every 5 (five) minutes as needed for chest pain. 12/25/21   Gregor Hams, MD  nitroGLYCERIN (NITROSTAT) 0.4 MG SL tablet DISSOLVE ONE TABLET UNDER THE TONGUE EVERY 5 MINUTES AS NEEDED FOR CHEST PAIN.  DO NOT EXCEED A TOTAL OF 3 DOSES IN 15 MINUTES 08/13/22   Waynetta Sandy, MD  pantoprazole (PROTONIX) 40 MG tablet Take 1 tablet (40 mg total) by mouth at bedtime. 03/18/22   [place], Betty G, MD  polyethylene glycol (MIRALAX / GLYCOLAX) 17 g packet Take 17 g by mouth daily as  needed for mild constipation. 12/03/21   Setzer, Edman Circle, PA-C  prochlorperazine (COMPAZINE) 5 MG tablet Take 1-2 tablets (5-10 mg total) by mouth every 6 (six) hours as needed for nausea. 12/03/21   Setzer, Edman Circle, PA-C  senna (SENOKOT) 8.6 MG TABS tablet Take 2 tablets (17.2 mg total) by mouth at bedtime. 12/03/21   Setzer, Edman Circle, PA-C  Tiotropium Bromide Monohydrate (SPIRIVA RESPIMAT) 2.5 MCG/ACT AERS Inhale 2 puffs into the lungs daily. 06/08/20   Deneise Lever, MD  tiZANidine (ZANAFLEX) 4 MG tablet Take 1 tablet (4 mg total) by mouth every 6 (six) hours as needed for muscle spasms. 01/28/22   Gregor Hams, MD  traZODone (DESYREL) 50 MG tablet Take 0.5-1 tablets (25-50 mg total) by mouth at bedtime as needed for sleep. 12/25/21   Gregor Hams, MD    Family History Family History  Problem Relation Age of Onset   Heart attack Maternal Grandmother        deceased   High blood pressure Mother    High Cholesterol Mother    Thyroid disease Mother    Thyroid disease Sister    Lung cancer Father    Cancer Maternal Uncle        unk type   Cancer Sister        unk type   Colon cancer Neg Hx    Colon polyps Neg Hx    Esophageal cancer Neg Hx    Rectal cancer Neg Hx    Stomach cancer Neg Hx     Social History Social History   Tobacco Use   Smoking status: Light Smoker    Types: Cigarettes   Smokeless tobacco: Never   Tobacco comments:    1-2 NOT EVERY DAY   Vaping Use   Vaping Use: Never used  Substance Use Topics   Alcohol use: Yes    Alcohol/week: 0.0 standard drinks of alcohol    Comment: rare   Drug use: No     Allergies   Shellfish allergy, Gabapentin, Hm lidocaine patch [lidocaine], and Tape   Review of Systems Review of Systems Per HPI  Physical  Exam Triage Vital Signs ED Triage Vitals  Enc Vitals Group     BP 08/24/22 0843 121/77     Pulse Rate 08/24/22 0843 (!) 102     Resp 08/24/22 0843 19     Temp 08/24/22 0843 98.9 F (37.2 C)     Temp src --      SpO2 08/24/22 0843 93 %     Weight --      Height --      Head Circumference --      Peak Flow --      Pain Score 08/24/22 0842 5     Pain Loc --      Pain Edu? --      Excl. in [place]? --    No data found.  Updated Vital Signs BP 121/77   Pulse (!) 102   Temp 98.9 F (37.2 C)   Resp 19   SpO2 93%   Visual Acuity Right Eye Distance:   Left Eye Distance:   Bilateral Distance:    Right Eye Near:   Left Eye Near:    Bilateral Near:     Physical Exam Constitutional:      General: She is not in acute distress.    Appearance: Normal appearance. She is not toxic-appearing or diaphoretic.  HENT:     Head: Normocephalic  and atraumatic.     Right Ear: Tympanic membrane and ear canal normal.     Left Ear: Tympanic membrane and ear canal normal.     Nose: Congestion present.     Mouth/Throat:     Mouth: Mucous membranes are moist.     Pharynx: Posterior oropharyngeal erythema present.  Eyes:     Extraocular Movements: Extraocular movements intact.     Conjunctiva/sclera: Conjunctivae normal.     Pupils: Pupils are equal, round, and reactive to light.  Cardiovascular:     Rate and Rhythm: Normal rate and regular rhythm.     Pulses: Normal pulses.     Heart sounds: Normal heart sounds.  Pulmonary:     Effort: Pulmonary effort is normal. No respiratory distress.     Breath sounds: Normal breath sounds. No stridor. No wheezing, rhonchi or rales.  Abdominal:     General: Abdomen is flat. Bowel sounds are normal.     Palpations: Abdomen is soft.  Musculoskeletal:        General: Normal range of motion.     Cervical back: Normal range of motion.  Skin:    General: Skin is warm and dry.  Neurological:     General: No focal deficit present.     Mental Status: She  is alert and oriented to person, place, and time. Mental status is at baseline.  Psychiatric:        Mood and Affect: Mood normal.        Behavior: Behavior normal.      UC Treatments / Results  Labs (all labs ordered are listed, but only abnormal results are displayed) Labs Reviewed - No data to display  EKG   Radiology No results found.  Procedures Procedures (including critical care time)  Medications Ordered in UC Medications  albuterol (PROVENTIL) (2.5 MG/3ML) 0.083% nebulizer solution 2.5 mg (2.5 mg Nebulization Given 08/24/22 0934)    Initial Impression / Assessment and Plan / UC Course  I have reviewed the triage vital signs and the nursing notes.  Pertinent labs & imaging results that were available during my care of the patient were reviewed by me and considered in my medical decision making (see chart for details).     Patient's symptoms are most likely related to viral illness.  Most likely influenza given patient's close exposure.  Although, there is concern given that patient's oxygen saturation is low.  On initial physical exam and triage it was ranging from 90 to 93%.  Albuterol nebulizer treatment was administered in urgent care.  Oxygen saturation originally increased to 98% but did not sustain.  It decreased down to 88% and was ranging from 88% to 92%.  She also does not appear physically well.  Therefore, patient was advised to go to the emergency department for further evaluation and management and was agreeable.  Suggested EMS transport given low oxygen but patient declined.  Risks associated with not going by EMS were discussed with patient.  Patient voiced understanding and still wished to self transport.  Patient left go to the hospital via self transport.  Final Clinical Impressions(s) / UC Diagnoses   Final diagnoses:  Viral illness  Low oxygen saturation     Discharge Instructions      Go to the emergency department as soon as you leave urgent  care for further evaluation and management.     ED Prescriptions   None    PDMP not reviewed this encounter.   Teodora Medici, [place]  08/24/22 0941  

## 2022-08-24 NOTE — ED Triage Notes (Signed)
Pt is present today with cough/ congestion, and body aches. Pt sx started Thursday

## 2022-08-24 NOTE — ED Notes (Signed)
Patient is being discharged from the Urgent Care and sent to the Emergency Department via POV . Per Oswaldo Conroy NP, patient is in need of higher level of care due to low O2. Patient is aware and verbalizes understanding of plan of care.  Vitals:   08/24/22 0843  BP: 121/77  Pulse: (!) 102  Resp: 19  Temp: 98.9 F (37.2 C)  SpO2: 93%

## 2022-08-24 NOTE — ED Triage Notes (Signed)
Pt arrived via POV, c/o cough, congestion, bodyaches and chest pain. States daughter tested positive for flu and she has been in close contact with her. Pt c/o sternal chest pain, intermittent. States cardiac hx.

## 2022-08-25 ENCOUNTER — Telehealth: Payer: Self-pay | Admitting: Family Medicine

## 2022-08-25 ENCOUNTER — Ambulatory Visit
Admission: EM | Admit: 2022-08-25 | Discharge: 2022-08-25 | Disposition: A | Discharge status: Home / Self Care | Payer: BC Managed Care – PPO | Attending: Urgent Care | Admitting: Urgent Care

## 2022-08-25 DIAGNOSIS — I7100 Dissection of unspecified site of aorta: Secondary | ICD-10-CM | POA: Diagnosis not present

## 2022-08-25 DIAGNOSIS — J449 Chronic obstructive pulmonary disease, unspecified: Secondary | ICD-10-CM

## 2022-08-25 DIAGNOSIS — U071 COVID-19: Secondary | ICD-10-CM | POA: Diagnosis not present

## 2022-08-25 DIAGNOSIS — I10 Essential (primary) hypertension: Secondary | ICD-10-CM | POA: Diagnosis not present

## 2022-08-25 MED ORDER — PAXLOVID (300/100) 20 X 150 MG & 10 X 100MG PO TBPK: ORAL_TABLET | ORAL | 0 refills | Status: DC

## 2022-08-25 MED ORDER — BENZONATATE 100 MG PO CAPS: 100.0000 mg | ORAL_CAPSULE | Freq: Three times a day (TID) | ORAL | 0 refills | Status: DC | PRN

## 2022-08-25 NOTE — Telephone Encounter (Signed)
Appointment required

## 2022-08-25 NOTE — ED Provider Notes (Signed)
[facility]  Note:  This document was prepared using Systems analyst and may include unintentional dictation errors.  MRN: [id number] DOB: 02/12/1951  Subjective:   Darlene Griffith is a 71 y.o. female presenting for COVID-19 infection, review of her labs.  Patient was seen through one of our partner urgent cares and then ended up going to the emergency room.  She left without being seen.  Would like to get medication for her COVID-19.  Denies any overt chest pain but would like help with a persistent cough.  Has also had chills and headache, malaise and fatigue.  Has a history of COPD but does not take any inhalers.  Patient is not currently smoking.  She does have a history of aortic dissection, hyperthyroidism and pulmonary nodules.  She is not really sure what medications she takes as her daughter helps her with this.  She does note that she takes medications for her high blood pressure and cholesterol.  No current facility-administered medications for this encounter.  Current Outpatient Medications:    acetaminophen (TYLENOL) 325 MG tablet, Take 1-2 tablets (325-650 mg total) by mouth every 4 (four) hours as needed for mild pain., Disp: , Rfl:    amLODipine (NORVASC) 10 MG tablet, Take 1 tablet (10 mg total) by mouth daily., Disp: 90 tablet, Rfl: 1   ascorbic acid (VITAMIN C) 1000 MG tablet, Take 1 tablet (1,000 mg total) by mouth daily., Disp: 30 tablet, Rfl: 0   atorvastatin (LIPITOR) 40 MG tablet, Take 1 tablet (40 mg total) by mouth daily., Disp: 90 tablet, Rfl: 2   carvedilol (COREG) 25 MG tablet, Take 1 tablet (25 mg total) by mouth 2 (two) times daily with a meal., Disp: 180 tablet, Rfl: 1   clotrimazole-betamethasone (LOTRISONE) cream, Apply to affected area 2 times daily prn, Disp: 15 g, Rfl: 0   diclofenac Sodium (VOLTAREN) 1 % GEL, Apply 4 g topically 4 (four) times daily., Disp: 100 g, Rfl: 0   DULoxetine (CYMBALTA) 30 MG capsule, Take 1  capsule (30 mg total) by mouth at bedtime., Disp: 30 capsule, Rfl: 1   guaiFENesin-codeine (CHERATUSSIN AC) 100-10 MG/5ML syrup, Take 5 mLs by mouth 4 (four) times daily as needed for cough., Disp: 120 mL, Rfl: 0   LORazepam (ATIVAN) 0.5 MG tablet, 1-2 tabs 30 - 60 min prior to MRI. Do not drive with this medicine., Disp: 4 tablet, Rfl: 0   losartan (COZAAR) 25 MG tablet, Take 1 tablet (25 mg total) by mouth daily., Disp: 90 tablet, Rfl: 1   methimazole (TAPAZOLE) 5 MG tablet, Take 0.5 tablets (2.5 mg total) by mouth 3 (three) times daily. Keep appt with endocrinologist 03/28/22., Disp: 21 tablet, Rfl: 0   MYRBETRIQ 25 MG TB24 tablet, Take 1 tablet by mouth once daily, Disp: 30 tablet, Rfl: 0   nitroGLYCERIN (NITROSTAT) 0.4 MG SL tablet, Place 1 tablet (0.4 mg total) under the tongue every 5 (five) minutes as needed for chest pain., Disp: 30 tablet, Rfl: 12   nitroGLYCERIN (NITROSTAT) 0.4 MG SL tablet, DISSOLVE ONE TABLET UNDER THE TONGUE EVERY 5 MINUTES AS NEEDED FOR CHEST PAIN.  DO NOT EXCEED A TOTAL OF 3 DOSES IN 15 MINUTES, Disp: 25 tablet, Rfl: 0   pantoprazole (PROTONIX) 40 MG tablet, Take 1 tablet (40 mg total) by mouth at bedtime., Disp: 90 tablet, Rfl: 2   polyethylene glycol (MIRALAX / GLYCOLAX) 17 g packet, Take 17 g by mouth daily as needed for mild  constipation., Disp: 14 each, Rfl: 0   prochlorperazine (COMPAZINE) 5 MG tablet, Take 1-2 tablets (5-10 mg total) by mouth every 6 (six) hours as needed for nausea., Disp: 30 tablet, Rfl: 0   senna (SENOKOT) 8.6 MG TABS tablet, Take 2 tablets (17.2 mg total) by mouth at bedtime., Disp: 60 tablet, Rfl: 0   tiZANidine (ZANAFLEX) 4 MG tablet, Take 1 tablet (4 mg total) by mouth every 6 (six) hours as needed for muscle spasms., Disp: 30 tablet, Rfl: 1   traZODone (DESYREL) 50 MG tablet, Take 0.5-1 tablets (25-50 mg total) by mouth at bedtime as needed for sleep., Disp: 20 tablet, Rfl: 0   Allergies  Allergen Reactions   Shellfish Allergy  Anaphylaxis   Gabapentin     Other reaction(s): Confusion (intolerance)   Hm Lidocaine Patch [Lidocaine] Other (See Comments)    Lidocaine patches caused burning   Tape Other (See Comments)    Pulls skin    Past Medical History:  Diagnosis Date   Chest pain    a. 01/2015 Echo: Nl LV fxn, Gr 1 DD, triv AI, mild MR.   Essential hypertension    Family history of lung cancer    Hepatic cyst    a. noted on CT 01/2015.   Hyperthyroidism    GOING TO [facility] FOR SECOND OPINION   Multinodular goiter    a. 01/2015 CT chest: multinodular goidter w/ substernal extension of the left lobe of the thyroid assoc w/ rightward deviation of tracheal air column.   Neck pain, chronic    Personal history of colonic polyps    Pulmonary nodules    a. 01/2015 CT Chest: RLL ~ 35m subpleural nodule - rec f/u in 6-12 mos.   Splenic cyst    a. noted on CT 01/2015.     Past Surgical History:  Procedure Laterality Date   BYPASS GRAFT AORTA TO AORTA Left 11/11/2021   Procedure: BYPASS GRAFT AORTA TO LEFT COMMON  FEMORAL ARTERY;  Surgeon: HCherre Robins MD;  [facility]  Service: Vascular;  Laterality: Left;   CERVICAL DISC ARTHROPLASTY N/A 07/05/2019   Procedure: Cervical Six-Seven Artificial disc replacement;  Surgeon: EKristeen Miss MD;  [place] [facility]  Service: Neurosurgery;  Laterality: N/A;  Cervical Six-Seven Artificial disc replacement   CESAREAN SECTION     COLONOSCOPY  01/13/2020   THORACIC AORTIC ENDOVASCULAR STENT GRAFT N/A 11/11/2021   Procedure: THORACIC AORTIC ENDOVASCULAR STENT GRAFT WITH LEFT BRACHIAL  ARTERY ACCESS;  Surgeon: HCherre Robins MD;  [facility]  Service: Vascular;  Laterality: N/A;   TONSILLECTOMY     AROUND 5-6 YRS OLD   ULTRASOUND GUIDANCE FOR VASCULAR ACCESS Bilateral 11/11/2021   Procedure: ULTRASOUND GUIDANCE FOR VASCULAR ACCESS;  Surgeon: HCherre Robins MD;  [facility]  Service: Vascular;  Laterality: Bilateral;   UPPER GASTROINTESTINAL ENDOSCOPY   01/13/2020    Family History  Problem Relation Age of Onset   Heart attack Maternal Grandmother        deceased   High blood pressure Mother    High Cholesterol Mother    Thyroid disease Mother    Thyroid disease Sister    Lung cancer Father    Cancer Maternal Uncle        unk type   Cancer Sister        unk type   Colon cancer Neg Hx    Colon polyps Neg Hx    Esophageal cancer Neg Hx    Rectal  cancer Neg Hx    Stomach cancer Neg Hx     Social History   Tobacco Use   Smoking status: Light Smoker    Types: Cigarettes   Smokeless tobacco: Never   Tobacco comments:    1-2 NOT EVERY DAY   Vaping Use   Vaping Use: Never used  Substance Use Topics   Alcohol use: Yes    Alcohol/week: 0.0 standard drinks of alcohol    Comment: rare   Drug use: No    ROS   Objective:   Vitals: BP (!) 147/82 (BP Location: Right Arm)   Pulse (!) 105   Temp 98.3 F (36.8 C) (Oral)   Resp 18   SpO2 94%   Physical Exam Constitutional:      General: She is not in acute distress.    Appearance: Normal appearance. She is well-developed. She is not ill-appearing, toxic-appearing or diaphoretic.  HENT:     Head: Normocephalic and atraumatic.     Nose: Nose normal.     Mouth/Throat:     Mouth: Mucous membranes are moist.  Eyes:     General: No scleral icterus.       Right eye: No discharge.        Left eye: No discharge.     Extraocular Movements: Extraocular movements intact.  Cardiovascular:     Rate and Rhythm: Normal rate and regular rhythm.     Heart sounds: Normal heart sounds. No murmur heard.    No friction rub. No gallop.  Pulmonary:     Effort: Pulmonary effort is normal. No respiratory distress.     Breath sounds: No stridor. No wheezing, rhonchi or rales.  Chest:     Chest wall: No tenderness.  Skin:    General: Skin is warm and dry.  Neurological:     General: No focal deficit present.     Mental Status: She is alert and oriented to person, place, and time.   Psychiatric:        Mood and Affect: Mood normal.        Behavior: Behavior normal.     Results for orders placed or performed during the hospital encounter of 08/24/22 (from the past 24 hour(s))  Resp Panel by RT-PCR (Flu A&B, Covid) Anterior Nasal Swab     Status: Abnormal   Collection Time: 08/24/22 11:15 AM   Specimen: Anterior Nasal Swab  Result Value Ref Range   SARS Coronavirus 2 by RT PCR POSITIVE (A) NEGATIVE   Influenza A by PCR NEGATIVE NEGATIVE   Influenza B by PCR NEGATIVE NEGATIVE  Basic metabolic panel     Status: Abnormal   Collection Time: 08/24/22 11:23 AM  Result Value Ref Range   Sodium 139 135 - 145 mmol/L   Potassium 3.4 (L) 3.5 - 5.1 mmol/L   Chloride 105 98 - 111 mmol/L   CO2 25 22 - 32 mmol/L   Glucose, Bld 119 (H) 70 - 99 mg/dL   BUN 11 8 - 23 mg/dL   Creatinine, Ser 0.54 0.44 - 1.00 mg/dL   Calcium 9.0 8.9 - 10.3 mg/dL   GFR, Estimated >60 >60 mL/min   Anion gap 9 5 - 15  CBC     Status: Abnormal   Collection Time: 08/24/22 11:23 AM  Result Value Ref Range   WBC 8.7 4.0 - 10.5 K/uL   RBC 3.76 (L) 3.87 - 5.11 MIL/uL   Hemoglobin 12.9 12.0 - 15.0 g/dL   HCT 41.3 36.0 -  46.0 %   MCV 109.8 (H) 80.0 - 100.0 fL   MCH 34.3 (H) 26.0 - 34.0 pg   MCHC 31.2 30.0 - 36.0 g/dL   RDW 12.2 11.5 - 15.5 %   Platelets 234 150 - 400 K/uL   nRBC 0.0 0.0 - 0.2 %  Troponin I (High Sensitivity)     Status: None   Collection Time: 08/24/22 11:23 AM  Result Value Ref Range   Troponin I (High Sensitivity) 4 <18 ng/L    DG Chest 2 View  Result Date: 08/24/2022 CLINICAL DATA:  Cough and chest pain. EXAM: CHEST - 2 VIEW COMPARISON:  07/10/2022 FINDINGS: The heart size within normal limits. Thoracic aortic stent graft is again seen. Tracheal deviation to the right is again seen, and consistent with large substernal goiter shown on recent chest CT. Both lungs are clear. The visualized skeletal structures are unremarkable. IMPRESSION: No active cardiopulmonary disease.  Large substernal goiter, as demonstrated on recent CT. Electronically Signed   By: Marlaine Hind M.D.   On: 08/24/2022 11:29     Assessment and Plan :   I have reviewed the PDMP during this encounter.  1. COVID-19 virus infection   2. Dissection of aorta, unspecified portion of aorta ([place])   3. Essential hypertension   4. Chronic obstructive pulmonary disease, unspecified COPD type ([place])     Complete lab review done with patient including her x-ray results.  Recommended starting Paxlovid.  Use supportive care otherwise. Counseled patient on potential for adverse effects with medications prescribed/recommended today, ER and return-to-clinic precautions discussed, patient verbalized understanding.    Jaynee Eagles, PA-C 08/25/22 1212

## 2022-08-25 NOTE — Discharge Instructions (Addendum)
Start taking Paxlovid for your COVID 19 infection. Do not take atorvastatin or lorazepam while taking your COVID 19 medication, Paxlovid.  For sore throat or cough try using a honey-based tea. Use 3 teaspoons of honey with juice squeezed from half lemon. Place shaved pieces of ginger into 1/2-1 cup of water and warm over stove top. Then mix the ingredients and repeat every 4 hours as needed. Please take Tylenol '500mg'$ -'650mg'$  once every 6 hours for fevers, aches and pains. Hydrate very well with at least 2 liters (64 ounces) of water. Eat light meals such as soups (chicken and noodles, chicken wild rice, vegetable).  Do not eat any foods that you are allergic to.  Start an antihistamine like Zyrtec for postnasal drainage, sinus congestion.  You can take this together with the cough medication.

## 2022-08-25 NOTE — ED Triage Notes (Signed)
Patient presents to UC for cough, HA, and chills since yesterday. Tested positive for COVID. Requesting antiviral meds. Treating symptoms with tylenol. States she was at the ED yesterday due to UC being concerned with 02%. States she left unable to wait.

## 2022-08-25 NOTE — Telephone Encounter (Signed)
Pt called to say she tested positive for Covid yesterday and would like to request medication to treat her symptoms sent to:  [facility] ([address]), [place] - [address] Phone: [phone]  Fax: [phone]

## 2022-08-26 NOTE — Telephone Encounter (Signed)
As per Pt's daughter, Pt went to UC on 08/24/22 and was prescribed Paxlovid and a cough suppressant.

## 2022-08-28 ENCOUNTER — Telehealth: Payer: Self-pay | Admitting: Licensed Clinical Social Worker

## 2022-08-28 NOTE — Patient Outreach (Signed)
  Care Coordination   08/28/2022 Name: Darlene Griffith MRN: [id number] DOB: 06/01/1951   Care Coordination Outreach Attempts:  An unsuccessful telephone outreach was attempted today to offer the patient information about available care coordination services as a benefit of their health plan.   Follow Up Plan:  Additional outreach attempts will be made to offer the patient care coordination information and services.   Encounter Outcome:  No Answer  Care Coordination Interventions Activated:  No   Care Coordination Interventions:  No, not indicated    Christa See, MSW, [name] Phone [phone] 1:32 PM

## 2022-09-24 NOTE — Progress Notes (Unsigned)
ACUTE VISIT Chief Complaint  Patient presents with   Cough   Referral   Hernia   HPI: [name] is a 71 y.o. female with a PMHx significant for HTN,COPD,chronic pain, hyperthyroidism,HLD, and aortic dissection s/p thoracic stent graft here today with above complaints.  -C/O persistent cough for two months. She reports having visited Urgent Care twice and receiving treatment; However, the cough has not completely resolved.  She denies experiencing wheezing or shortness of breath but reports nasal congestion, postnasal drainage, and thick mucus. She has no fever,chills, orthopnea,PND,or hemoptysis. On 08/25/22 Dx'ed with COVID 19 infection. Benzonatate helped with cough. CXR 08/24/22 negative for active pulmonary disease.  COPD: She is not on treatment. States that she quit smoking. Negative for heartburn. GERD on Protonix 40 mg (per med list), not sure if she is taking it s her daughter manages her pillbox.  -Hernia, noted mass protruding around surgical incision, LLQ. It does not improve when lying down. Having severe pain, constant. Denies nausea, vomiting, changes in bowel habits, blood in stool or melena. C/O having bowel movements shortly after eating, which has been going on since vascular surgery in 11/2021.  -Hyperthyroidism: She is not sure is she is taking methimazole. She is scheduled to see an endocrinologist 11/2022. Requesting something to help with energy. + Fatigue. She reports difficulty sleeping, a disrupted sleep schedule, exhaustion. On medication list she has Trazodone and Cymbalta.  Lab Results  Component Value Date   TSH <0.01 Repeated and verified X2. (L) 12/13/2021      09/26/2022    7:06 AM 12/19/2021    3:51 PM 12/13/2021   10:09 AM 12/12/2021    2:20 PM 10/26/2020    8:53 AM  Depression screen PHQ 2/9  Decreased Interest 3 0 3 3 0  Down, Depressed, Hopeless 1 0 3 3 0  PHQ - 2 Score 4 0 6 6 0  Altered sleeping 3 0 3 3 0  Tired, decreased  energy 3 0 3 3 0  Change in appetite 3 0 3 3 0  Feeling bad or failure about yourself  0 0 0 0 0  Trouble concentrating 0 0 1 2 0  Moving slowly or fidgety/restless 1 0 3 3 0  Suicidal thoughts 0 0 0 0 0  PHQ-9 Score 14 0 19 20 0  Difficult doing work/chores Very difficult Not difficult at all Somewhat difficult  Not difficult at all   -Requesting referral to cardiologist, last seen in 2022. HTN on Carvedilol 25 mg bid, Losartan 25 mg daily,and Amlodipine 10 mg daily. Negative for severe/frequent headache, visual changes, chest pain,palpitation,  focal weakness, or edema.  Review of Systems  Constitutional:  Positive for fatigue. Negative for activity change, appetite change and fever.  HENT:  Negative for mouth sores and nosebleeds.   Endocrine: Negative for cold intolerance and heat intolerance.  Genitourinary:  Negative for decreased urine volume, dysuria and hematuria.  Musculoskeletal:  Positive for arthralgias, back pain and myalgias.  Skin:  Negative for rash.  Neurological:  Negative for syncope and facial asymmetry.  Psychiatric/Behavioral:  Positive for sleep disturbance. Negative for confusion. The patient is nervous/anxious.   See other pertinent positives and negatives in HPI.  Current Outpatient Medications on File Prior to Visit  Medication Sig Dispense Refill   acetaminophen (TYLENOL) 325 MG tablet Take 1-2 tablets (325-650 mg total) by mouth every 4 (four) hours as needed for mild pain.     amLODipine (NORVASC) 10 MG tablet Take  1 tablet (10 mg total) by mouth daily. 90 tablet 1   ascorbic acid (VITAMIN C) 1000 MG tablet Take 1 tablet (1,000 mg total) by mouth daily. 30 tablet 0   atorvastatin (LIPITOR) 40 MG tablet Take 1 tablet (40 mg total) by mouth daily. 90 tablet 2   carvedilol (COREG) 25 MG tablet Take 1 tablet (25 mg total) by mouth 2 (two) times daily with a meal. 180 tablet 1   clotrimazole-betamethasone (LOTRISONE) cream Apply to affected area 2 times daily  prn 15 g 0   diclofenac Sodium (VOLTAREN) 1 % GEL Apply 4 g topically 4 (four) times daily. 100 g 0   DULoxetine (CYMBALTA) 30 MG capsule Take 1 capsule (30 mg total) by mouth at bedtime. 30 capsule 1   LORazepam (ATIVAN) 0.5 MG tablet 1-2 tabs 30 - 60 min prior to MRI. Do not drive with this medicine. 4 tablet 0   losartan (COZAAR) 25 MG tablet Take 1 tablet (25 mg total) by mouth daily. 90 tablet 1   methimazole (TAPAZOLE) 5 MG tablet Take 0.5 tablets (2.5 mg total) by mouth 3 (three) times daily. Keep appt with endocrinologist 03/28/22. 21 tablet 0   MYRBETRIQ 25 MG TB24 tablet Take 1 tablet by mouth once daily 30 tablet 0   nitroGLYCERIN (NITROSTAT) 0.4 MG SL tablet Place 1 tablet (0.4 mg total) under the tongue every 5 (five) minutes as needed for chest pain. 30 tablet 12   nitroGLYCERIN (NITROSTAT) 0.4 MG SL tablet DISSOLVE ONE TABLET UNDER THE TONGUE EVERY 5 MINUTES AS NEEDED FOR CHEST PAIN.  DO NOT EXCEED A TOTAL OF 3 DOSES IN 15 MINUTES 25 tablet 0   pantoprazole (PROTONIX) 40 MG tablet Take 1 tablet (40 mg total) by mouth at bedtime. 90 tablet 2   polyethylene glycol (MIRALAX / GLYCOLAX) 17 g packet Take 17 g by mouth daily as needed for mild constipation. 14 each 0   prochlorperazine (COMPAZINE) 5 MG tablet Take 1-2 tablets (5-10 mg total) by mouth every 6 (six) hours as needed for nausea. 30 tablet 0   senna (SENOKOT) 8.6 MG TABS tablet Take 2 tablets (17.2 mg total) by mouth at bedtime. 60 tablet 0   tiZANidine (ZANAFLEX) 4 MG tablet Take 1 tablet (4 mg total) by mouth every 6 (six) hours as needed for muscle spasms. 30 tablet 1   traZODone (DESYREL) 50 MG tablet Take 0.5-1 tablets (25-50 mg total) by mouth at bedtime as needed for sleep. 20 tablet 0   No current facility-administered medications on file prior to visit.   Past Medical History:  Diagnosis Date   Chest pain    a. 01/2015 Echo: Nl LV fxn, Gr 1 DD, triv AI, mild MR.   Essential hypertension    Family history of lung  cancer    Hepatic cyst    a. noted on CT 01/2015.   Hyperthyroidism    GOING TO [facility] FOR SECOND OPINION   Multinodular goiter    a. 01/2015 CT chest: multinodular goidter w/ substernal extension of the left lobe of the thyroid assoc w/ rightward deviation of tracheal air column.   Neck pain, chronic    Personal history of colonic polyps    Pulmonary nodules    a. 01/2015 CT Chest: RLL ~ 73m subpleural nodule - rec f/u in 6-12 mos.   Splenic cyst    a. noted on CT 01/2015.   Allergies  Allergen Reactions   Shellfish Allergy Anaphylaxis   Gabapentin  Other reaction(s): Confusion (intolerance)   Hm Lidocaine Patch [Lidocaine] Other (See Comments)    Lidocaine patches caused burning   Tape Other (See Comments)    Pulls skin    Social History   Socioeconomic History   Marital status: Single    Spouse name: Not on file   Number of children: Not on file   Years of education: Not on file   Highest education level: Not on file  Occupational History   Occupation: retired  Tobacco Use   Smoking status: Light Smoker    Types: Cigarettes   Smokeless tobacco: Never   Tobacco comments:    1-2 NOT EVERY DAY   Vaping Use   Vaping Use: Never used  Substance and Sexual Activity   Alcohol use: Yes    Alcohol/week: 0.0 standard drinks of alcohol    Comment: rare   Drug use: No   Sexual activity: Not Currently  Other Topics Concern   Not on file  Social History Narrative   Not on file   Social Determinants of Health   Financial Resource Strain: Low Risk  (12/19/2021)   Overall Financial Resource Strain (CARDIA)    Difficulty of Paying Living Expenses: Not hard at all  Food Insecurity: No Food Insecurity (12/19/2021)   Hunger Vital Sign    Worried About Running Out of Food in the Last Year: Never true    Ran Out of Food in the Last Year: Never true  Transportation Needs: No Transportation Needs (12/19/2021)   PRAPARE - Hydrologist (Medical): No    Lack  of Transportation (Non-Medical): No  Physical Activity: Inactive (12/19/2021)   Exercise Vital Sign    Days of Exercise per Week: 0 days    Minutes of Exercise per Session: 0 min  Stress: No Stress Concern Present (12/19/2021)   [place]    Feeling of Stress : Not at all  Social Connections: Moderately Integrated (12/19/2021)   Social Connection and Isolation Panel [NHANES]    Frequency of Communication with Friends and Family: More than three times a week    Frequency of Social Gatherings with Friends and Family: More than three times a week    Attends Religious Services: More than 4 times per year    Active Member of [employer] or Organizations: Yes    Attends Archivist Meetings: 1 to 4 times per year    Marital Status: Never married   Vitals:   09/26/22 0700  BP: 128/80  Pulse: 94  Resp: 16  SpO2: 98%   Body mass index is 28.98 kg/m.  Physical Exam Vitals and nursing note reviewed.  Constitutional:      General: She is not in acute distress.    Appearance: She is well-developed.  HENT:     Head: Normocephalic and atraumatic.     Nose: Congestion and rhinorrhea present.     Right Turbinates: Enlarged.     Left Turbinates: Enlarged.     Mouth/Throat:     Mouth: Mucous membranes are moist.     Pharynx: Oropharynx is clear.  Eyes:     Conjunctiva/sclera: Conjunctivae normal.  Cardiovascular:     Rate and Rhythm: Normal rate and regular rhythm.     Pulses:          Posterior tibial pulses are 2+ on the right side and 2+ on the left side.     Heart sounds: Murmur (SEM I-II/VI  RUSB and LUSB) heard.  Pulmonary:     Effort: Pulmonary effort is normal. No respiratory distress.     Breath sounds: Normal breath sounds.  Abdominal:     Palpations: Abdomen is soft. There is no mass.     Tenderness: There is abdominal tenderness in the left lower quadrant. There is no guarding or rebound.     Hernia: A hernia  is present.    Lymphadenopathy:     Cervical: No cervical adenopathy.  Skin:    General: Skin is warm.     Findings: No erythema or rash.  Neurological:     General: No focal deficit present.     Mental Status: She is alert and oriented to person, place, and time.     Cranial Nerves: No cranial nerve deficit.     Gait: Gait normal.  Psychiatric:        Mood and Affect: Mood is anxious.   ASSESSMENT AND PLAN:  Darlene Griffith was seen today for cough, referral and hernia.  Diagnoses and all orders for this visit:  Centrilobular emphysema ([place]) -     Ipratropium-Albuterol (COMBIVENT RESPIMAT) 20-100 MCG/ACT AERS respimat; Inhale 1 puff into the lungs in the morning, at noon, and at bedtime.  Essential hypertension  Dissection of aorta, unspecified portion of aorta ([place]) -     Ambulatory referral to Cardiology  Hyperthyroidism -     T4, free; Future -     TSH; Future -     TSH -     T4, free  Incisional hernia, without obstruction or gangrene -     Ambulatory referral to General Surgery  Subacute cough -     benzonatate (TESSALON) 100 MG capsule; Take 1 capsule (100 mg total) by mouth 3 (three) times daily as needed for up to 14 days for cough. -     fluticasone (FLONASE) 50 MCG/ACT nasal spray; Place 2 sprays into both nostrils at bedtime as needed for allergies or rhinitis.  Need for pneumococcal vaccination -     Pneumococcal polysaccharide vaccine 23-valent greater than or equal to 2yo subcutaneous/IM  Chronic fatigue    Centrilobular emphysema ([place]) Assessment & Plan: This could be contributing to her cough. Recent chest x-ray negative for acute process. Combivent Respimat 1 puff 3 times daily started today. Continue avoiding tobacco use. Follow-up in 2 months, before if needed.  Orders: -     Combivent Respimat; Inhale 1 puff into the lungs in the morning, at noon, and at bedtime.  Dispense: 4 g; Refill: 2  Essential hypertension Assessment & Plan: BP  adequately controlled. Continue Carvedilol 25 mg bid,Amlodipine 10 mg,and Losartan 25 mg daily. Low salt diet. Continue monitoring BP at home.   Dissection of aorta, unspecified portion of aorta [facility]) Assessment & Plan: Status post insertion of endovascular thoracic aortic stent graft. Following with vascular surgeon. Cardiology referral placed.  Orders: -     Ambulatory referral to Cardiology  Hyperthyroidism Assessment & Plan: Problem has not been well-controlled, this could be contributing to some of her symptoms, including insomnia and fatigue. She has an appointment with endocrinologist in 11/2022, stressed the importance of keeping appointment. She is not sure if she is taking methimazole. TSH and free T4 ordered today.  Orders: -     T4, free; Future -     TSH; Future  Incisional hernia, without obstruction or gangrene Assessment & Plan: Discussed possible complications. Appt with general surgeon will be arranged. Instructed about warning signs.  Orders: -  Ambulatory referral to General Surgery  Subacute cough Assessment & Plan: We discussed possible etiologies. Some of her chronic medical problems can be contributing factors. She is not sure if she is taking Protonix, denies heartburn.  Orders: -     Benzonatate; Take 1 capsule (100 mg total) by mouth 3 (three) times daily as needed for up to 14 days for cough.  Dispense: 30 capsule; Refill: 0 -     Fluticasone Propionate; Place 2 sprays into both nostrils at bedtime as needed for allergies or rhinitis.  Dispense: 16 g; Refill: 1  Need for pneumococcal vaccination -     Pneumococcal polysaccharide vaccine 23-valent greater than or equal to 2yo subcutaneous/IM  Chronic fatigue Assessment & Plan: Requesting something to help with energy, explained that some of her chronic medical problems can be contributing factors. Improving sleep hygiene will help. She is not sure about which medications she is taking,  according to med list she is supposed to be on Trazodone and Cymbalta.    I spent a total of 43 minutes in both face to face and non face to face activities for this visit on the date of this encounter. During this time history was obtained and documented, examination was performed, prior labs/imaging reviewed, and assessment/plan discussed.  Return in about 2 months (around 11/27/2022) for chronic problems.  Xzaiver Vayda G. [place], MD  [facility]. [place] office.

## 2022-09-26 ENCOUNTER — Ambulatory Visit (INDEPENDENT_AMBULATORY_CARE_PROVIDER_SITE_OTHER): Payer: BC Managed Care – PPO | Admitting: Family Medicine

## 2022-09-26 ENCOUNTER — Encounter: Payer: Self-pay | Admitting: Family Medicine

## 2022-09-26 VITALS — BP 128/80 | HR 94 | Resp 16 | Ht 61.0 in | Wt 153.4 lb

## 2022-09-26 DIAGNOSIS — J432 Centrilobular emphysema: Secondary | ICD-10-CM

## 2022-09-26 DIAGNOSIS — E0590 Thyrotoxicosis, unspecified without thyrotoxic crisis or storm: Secondary | ICD-10-CM | POA: Diagnosis not present

## 2022-09-26 DIAGNOSIS — K432 Incisional hernia without obstruction or gangrene: Secondary | ICD-10-CM

## 2022-09-26 DIAGNOSIS — Z23 Encounter for immunization: Secondary | ICD-10-CM | POA: Diagnosis not present

## 2022-09-26 DIAGNOSIS — R052 Subacute cough: Secondary | ICD-10-CM

## 2022-09-26 DIAGNOSIS — I10 Essential (primary) hypertension: Secondary | ICD-10-CM

## 2022-09-26 DIAGNOSIS — I7100 Dissection of unspecified site of aorta: Secondary | ICD-10-CM

## 2022-09-26 DIAGNOSIS — R5382 Chronic fatigue, unspecified: Secondary | ICD-10-CM

## 2022-09-26 LAB — TSH: TSH: 0.07 u[IU]/mL — ABNORMAL LOW (ref 0.35–5.50)

## 2022-09-26 LAB — T4, FREE: Free T4: 0.74 ng/dL (ref 0.60–1.60)

## 2022-09-26 MED ORDER — FLUTICASONE PROPIONATE 50 MCG/ACT NA SUSP: 2.0000 | Freq: Every evening | NASAL | 1 refills | Status: DC | PRN

## 2022-09-26 MED ORDER — BENZONATATE 100 MG PO CAPS: 100.0000 mg | ORAL_CAPSULE | Freq: Three times a day (TID) | ORAL | 0 refills | Status: AC | PRN

## 2022-09-26 MED ORDER — COMBIVENT RESPIMAT 20-100 MCG/ACT IN AERS: 1.0000 | INHALATION_SPRAY | Freq: Three times a day (TID) | RESPIRATORY_TRACT | 2 refills | Status: DC

## 2022-09-26 NOTE — Assessment & Plan Note (Addendum)
This could be contributing to her cough. Recent chest x-ray negative for acute process. Combivent Respimat 1 puff 3 times daily started today. Continue avoiding tobacco use. Follow-up in 2 months, before if needed.

## 2022-09-26 NOTE — Assessment & Plan Note (Signed)
Problem has not been well-controlled, this could be contributing to some of her symptoms, including insomnia and fatigue. She has an appointment with endocrinologist in 11/2022, stressed the importance of keeping appointment. She is not sure if she is taking methimazole. TSH and free T4 ordered today.

## 2022-09-26 NOTE — Patient Instructions (Addendum)
A few things to remember from today's visit:  Centrilobular emphysema ([place]) - Plan: Ipratropium-Albuterol ([place]) 20-100 MCG/ACT AERS respimat  Essential hypertension  Dissection of aorta, unspecified portion of aorta ([place]) - Plan: Ambulatory referral to Cardiology  Hyperthyroidism - Plan: T4, free, TSH  Incisional hernia, without obstruction or gangrene - Plan: Ambulatory referral to General Surgery  Subacute cough - Plan: benzonatate (TESSALON) 100 MG capsule  Flonase nasal spray at night to help with nasal congestion for 10-14 days then as needed. Nasal saline irrigations. Continue Protonix. Plain Mucinex for cough. Combivent 1-2 puff 3 times daily for cough.  Hernia, Adult     A hernia happens when an organ or tissue inside your body pushes out through a weak spot in the muscles of your belly (abdomen). This makes a bulge. The bulge may be: In a scar from a surgery that was done in your belly (incisional hernia). Near your belly button (umbilical hernia). In your groin (inguinal hernia). Your groin is the area where your leg meets your lower belly. If you are a female, this type could also be in your scrotum. In your upper thigh (femoral hernia). Inside your belly (hiatal hernia). This happens when your stomach slides above the muscle between your belly and your chest (diaphragm). What are the causes? This condition may be caused by: Lifting heavy things. Coughing over a long period of time. Having trouble pooping (constipation). Trouble pooping can lead to straining. A cut from surgery in your belly. A physical problem that is present at birth. Being very overweight. Smoking. Too much fluid in your belly. A testicle that has not moved down into the scrotum, in males. What are the signs or symptoms? The main symptom is a bulge in the area of the hernia, but a bulge may not always be seen. It may grow bigger or be easier to see when you cough or strain (such as  when lifting something heavy). A hernia that can be pushed back into the belly rarely causes pain. A hernia that cannot be pushed back into the belly may lose its blood supply. This may cause: Pain. Fever. A feeling like you may vomit, and vomiting. Swelling. Trouble pooping. How is this treated? A hernia that is small and painless may not need to be treated. A hernia that is large or painful may be treated with surgery. Surgery to treat a hernia involves pushing the bulge back into place and repairing the weak area of the muscle or belly. Follow these instructions at home: Activity Avoid straining the muscles near your hernia. This can happen when you: Lift something heavy. Poop (have a bowel movement). Do not lift anything that is heavier than 10 lb (4.5 kg), or the limit that you are told. When you lift something heavy, use your leg muscles. Do not use your back muscles to lift. Prevent trouble pooping If told by your doctor, take steps to prevent trouble pooping. You may need to: Drink enough fluid to keep your pee (urine) pale yellow. Take medicines. You will be told what medicines to take. Eat foods that are high in fiber. These include beans, whole grains, and fresh fruits and vegetables. Limit foods that are high in fat and sugar. These include fried or sweet foods. General instructions When you cough, try to cough gently. You may try to push your hernia back in by gently pressing on it when you are lying down. Do not try to force the bulge back in if it  will not go in easily. If you are overweight, work with your doctor to lose weight safely. Do not smoke or use any products that contain nicotine or tobacco. If you need help quitting, ask your doctor. If you will be having surgery, watch your hernia for changes in shape, size, or color. Tell your doctor if you see any changes. Take over-the-counter and prescription medicines only as told by your doctor. Keep all follow-up  visits. Contact a doctor if: You get new pain, swelling, or redness near your hernia. You poop fewer times in a week than normal. You have trouble pooping. You have poop that is more dry than normal. You have poop that is harder or larger than normal. Get help right away if: You have a fever or chills. You have belly pain that gets worse. You feel like you may vomit, or you vomit. Your hernia cannot be pushed in by gently pressing on it when you are lying down. Your hernia: Changes in shape or size. Changes color. Feels hard, or it hurts when you touch it. These symptoms may be an emergency. Get help right away. Call your local emergency services (911 in the [place] Do not wait to see if the symptoms will go away. Do not drive yourself to the hospital. Summary A hernia happens when an organ or tissue inside your body pushes out through a weak spot in the belly muscles. This creates a bulge. If your hernia is small and it does not hurt, you may not need treatment. If your hernia is large or it hurts, you may need surgery. If you will be having surgery, watch your hernia for changes in shape, size, or color. Tell your doctor about any changes. This information is not intended to replace advice given to you by your health care provider. Make sure you discuss any questions you have with your health care provider. Document Revised: 05/14/2020 Document Reviewed: 05/14/2020 Elsevier Patient Education  [place].  If you need refills for medications you take chronically, please call your pharmacy. Do not use My Chart to request refills or for acute issues that need immediate attention. If you send a my chart message, it may take a few days to be addressed, specially if I am not in the office.  Please be sure medication list is accurate. If a new problem present, please set up appointment sooner than planned today.

## 2022-09-26 NOTE — Assessment & Plan Note (Signed)
BP adequately controlled. Continue Carvedilol 25 mg bid,Amlodipine 10 mg,and Losartan 25 mg daily. Low salt diet. Continue monitoring BP at home.

## 2022-09-26 NOTE — Assessment & Plan Note (Signed)
Status post insertion of endovascular thoracic aortic stent graft. Following with vascular surgeon. Cardiology referral placed.

## 2022-09-26 NOTE — Assessment & Plan Note (Signed)
We discussed possible etiologies. Some of her chronic medical problems can be contributing factors. She is not sure if she is taking Protonix, denies heartburn.

## 2022-09-27 MED ORDER — METHIMAZOLE 5 MG PO TABS: 2.5000 mg | ORAL_TABLET | Freq: Three times a day (TID) | ORAL | 0 refills | Status: DC

## 2022-09-27 NOTE — Assessment & Plan Note (Signed)
Discussed possible complications. Appt with general surgeon will be arranged. Instructed about warning signs.

## 2022-09-27 NOTE — Assessment & Plan Note (Signed)
Requesting something to help with energy, explained that some of her chronic medical problems can be contributing factors. Improving sleep hygiene will help. She is not sure about which medications she is taking, according to med list she is supposed to be on Trazodone and Cymbalta.

## 2022-09-30 ENCOUNTER — Other Ambulatory Visit: Payer: Self-pay

## 2022-09-30 DIAGNOSIS — E0590 Thyrotoxicosis, unspecified without thyrotoxic crisis or storm: Secondary | ICD-10-CM

## 2022-09-30 MED ORDER — METHIMAZOLE 5 MG PO TABS: 2.5000 mg | ORAL_TABLET | Freq: Three times a day (TID) | ORAL | 3 refills | Status: AC

## 2022-10-05 ENCOUNTER — Other Ambulatory Visit: Payer: Self-pay | Admitting: Family Medicine

## 2022-10-09 NOTE — Progress Notes (Signed)
Cardiology Office Note   Date:  10/10/2022   ID:  Darlene Griffith, DOB 1951/09/15, MRN [id number]  PCP:  Martinique, Betty G, MD  Cardiologist:   None Referring:  Martinique, Betty G, MD  Chief Complaint  Patient presents with   PVD      History of Present Illness: Darlene Griffith is a 71 y.o. female who presents for evaluation of significant cardiovascular risk factors.      The patient was at [facility] earlier this year.  The patient had a type B aortic dissection.  She had branched endovascular aortic repair and stenting of the left common and external iliac artery complicated by iliac artery rupture requiring left common iliac to common femoral artery bypass.  She had hypertensive urgency possibly precipitating this.  She has not had any prior cardiac history.  She has had hypertension.  She has her lipids managed.  She does not recall any prior cardiovascular testing but I do see an echocardiogram from 2016 that demonstrated normal ejection fraction and no significant vascular disease.  She has not had any new symptoms such as chest pressure, neck or arm discomfort.  She had no new shortness of breath, PND or orthopnea.  Looking through her notes from earlier this year during her hospitalization there were no acute cardiac issues.  She gets around slowly in the house because of some back pain.  However, she lives independently.   Past Medical History:  Diagnosis Date   Chest pain    a. 01/2015 Echo: Nl LV fxn, Gr 1 DD, triv AI, mild MR.   Essential hypertension    Hepatic cyst    a. noted on CT 01/2015.   Hyperthyroidism    Multinodular goiter    a. 01/2015 CT chest: multinodular goidter w/ substernal extension of the left lobe of the thyroid assoc w/ rightward deviation of tracheal air column.   Neck pain, chronic    Personal history of colonic polyps    Pulmonary nodules    a. 01/2015 CT Chest: RLL ~ 17m subpleural nodule - rec f/u in 6-12 mos.   Splenic cyst    a. noted on CT  01/2015.    Past Surgical History:  Procedure Laterality Date   BYPASS GRAFT AORTA TO AORTA Left 11/11/2021   Procedure: BYPASS GRAFT AORTA TO LEFT COMMON  FEMORAL ARTERY;  Surgeon: HCherre Robins MD;  [facility]  Service: Vascular;  Laterality: Left;   CERVICAL DISC ARTHROPLASTY N/A 07/05/2019   Procedure: Cervical Six-Seven Artificial disc replacement;  Surgeon: EKristeen Miss MD;  [facility]  Service: Neurosurgery;  Laterality: N/A;  Cervical Six-Seven Artificial disc replacement   CESAREAN SECTION     COLONOSCOPY  01/13/2020   THORACIC AORTIC ENDOVASCULAR STENT GRAFT N/A 11/11/2021   Procedure: THORACIC AORTIC ENDOVASCULAR STENT GRAFT WITH LEFT BRACHIAL  ARTERY ACCESS;  Surgeon: HCherre Robins MD;  Location: [facility];  Service: Vascular;  Laterality: N/A;   TONSILLECTOMY     AROUND 5-6 YRS OLD   ULTRASOUND GUIDANCE FOR VASCULAR ACCESS Bilateral 11/11/2021   Procedure: ULTRASOUND GUIDANCE FOR VASCULAR ACCESS;  Surgeon: HCherre Robins MD;  [facility]  Service: Vascular;  Laterality: Bilateral;   UPPER GASTROINTESTINAL ENDOSCOPY  01/13/2020     Current Outpatient Medications  Medication Sig Dispense Refill   acetaminophen (TYLENOL) 325 MG tablet Take 1-2 tablets (325-650 mg total) by mouth every 4 (four) hours as needed for mild pain.     amLODipine (  NORVASC) 10 MG tablet Take 1 tablet by mouth once daily 90 tablet 2   ascorbic acid (VITAMIN C) 1000 MG tablet Take 1 tablet (1,000 mg total) by mouth daily. 30 tablet 0   atorvastatin (LIPITOR) 40 MG tablet Take 1 tablet (40 mg total) by mouth daily. 90 tablet 2   benzonatate (TESSALON) 100 MG capsule Take 1 capsule (100 mg total) by mouth 3 (three) times daily as needed for up to 14 days for cough. 30 capsule 0   carvedilol (COREG) 25 MG tablet Take 1 tablet (25 mg total) by mouth 2 (two) times daily with a meal. 180 tablet 1   clotrimazole-betamethasone (LOTRISONE) cream Apply to affected area 2 times daily prn 15 g 0    diclofenac Sodium (VOLTAREN) 1 % GEL Apply 4 g topically 4 (four) times daily. 100 g 0   DULoxetine (CYMBALTA) 30 MG capsule Take 1 capsule (30 mg total) by mouth at bedtime. 30 capsule 1   fluticasone (FLONASE) 50 MCG/ACT nasal spray Place 2 sprays into both nostrils at bedtime as needed for allergies or rhinitis. 16 g 1   Ipratropium-Albuterol (COMBIVENT RESPIMAT) 20-100 MCG/ACT AERS respimat Inhale 1 puff into the lungs in the morning, at noon, and at bedtime. 4 g 2   LORazepam (ATIVAN) 0.5 MG tablet 1-2 tabs 30 - 60 min prior to MRI. Do not drive with this medicine. 4 tablet 0   losartan (COZAAR) 25 MG tablet Take 1 tablet (25 mg total) by mouth daily. 90 tablet 1   methimazole (TAPAZOLE) 5 MG tablet Take 0.5 tablets (2.5 mg total) by mouth 3 (three) times daily. Keep appt with endocrinologist 11/2022 90 tablet 3   MYRBETRIQ 25 MG TB24 tablet Take 1 tablet by mouth once daily 30 tablet 0   nitroGLYCERIN (NITROSTAT) 0.4 MG SL tablet Place 1 tablet (0.4 mg total) under the tongue every 5 (five) minutes as needed for chest pain. 30 tablet 12   nitroGLYCERIN (NITROSTAT) 0.4 MG SL tablet DISSOLVE ONE TABLET UNDER THE TONGUE EVERY 5 MINUTES AS NEEDED FOR CHEST PAIN.  DO NOT EXCEED A TOTAL OF 3 DOSES IN 15 MINUTES 25 tablet 0   pantoprazole (PROTONIX) 40 MG tablet Take 1 tablet (40 mg total) by mouth at bedtime. 90 tablet 2   polyethylene glycol (MIRALAX / GLYCOLAX) 17 g packet Take 17 g by mouth daily as needed for mild constipation. 14 each 0   prochlorperazine (COMPAZINE) 5 MG tablet Take 1-2 tablets (5-10 mg total) by mouth every 6 (six) hours as needed for nausea. 30 tablet 0   senna (SENOKOT) 8.6 MG TABS tablet Take 2 tablets (17.2 mg total) by mouth at bedtime. 60 tablet 0   tiZANidine (ZANAFLEX) 4 MG tablet Take 1 tablet (4 mg total) by mouth every 6 (six) hours as needed for muscle spasms. 30 tablet 1   traZODone (DESYREL) 50 MG tablet Take 0.5-1 tablets (25-50 mg total) by mouth at bedtime as  needed for sleep. 20 tablet 0   No current facility-administered medications for this visit.    Allergies:   Shellfish allergy, Gabapentin, Hm lidocaine patch [lidocaine], and Tape    Social History:  The patient  reports that she has been smoking cigarettes. She has never used smokeless tobacco. She reports current alcohol use. She reports that she does not use drugs.    Family History:  The patient's family history includes Cancer in her maternal uncle and sister; Heart attack in her maternal grandmother; High Cholesterol in  her mother; High blood pressure in her mother; Lung cancer in her father; Thyroid disease in her mother and sister.    ROS:  Please see the history of present illness.   Otherwise, review of systems are positive for none.   All other systems are reviewed and negative.    PHYSICAL EXAM: VS:  BP 118/68 (BP Location: Left Arm, Patient Position: Sitting, Cuff Size: Large)   Pulse 86   Ht '5\' 1"'$  (1.549 m)   Wt 154 lb (69.9 kg)   BMI 29.10 kg/m  , BMI Body mass index is 29.1 kg/m. GENERAL:  Well appearing HEENT:  Pupils equal round and reactive, fundi not visualized, oral mucosa unremarkable NECK:  No jugular venous distention, waveform within normal limits, carotid upstroke brisk and symmetric, no bruits, no thyromegaly LYMPHATICS:  No cervical, inguinal adenopathy LUNGS:  Clear to auscultation bilaterally BACK:  No CVA tenderness CHEST:  Unremarkable HEART:  PMI not displaced or sustained,S1 and S2 within normal limits, no S3, no S4, no clicks, no rubs, no murmurs ABD:  Flat, positive bowel sounds normal in frequency in pitch, no bruits, no rebound, no guarding, no midline pulsatile mass, no hepatomegaly, no splenomegaly EXT:  2 plus pulses right radial, diminished left radial, palpable lower extremity pulses, no edema, no cyanosis no clubbing SKIN:  No rashes no nodules NEURO:  Cranial nerves II through XII grossly intact, motor grossly intact throughout PSYCH:   Cognitively intact, oriented to person place and time    EKG:  EKG is ordered today. The ekg ordered today demonstrates sinus rhythm, rate 86, poor anterior R wave progression, nonspecific T wave changes.   Recent Labs: 11/06/2021: B Natriuretic Peptide 21.0 11/16/2021: Magnesium 1.8 12/13/2021: ALT 16 08/24/2022: BUN 11; Creatinine, Ser 0.54; Hemoglobin 12.9; Platelets 234; Potassium 3.4; Sodium 139 09/26/2022: TSH 0.07    Lipid Panel    Component Value Date/Time   CHOL 283 (H) 04/12/2021 1301   TRIG 130 11/12/2021 0320   HDL 60.90 04/12/2021 1301   CHOLHDL 5 04/12/2021 1301   VLDL 38.4 04/12/2021 1301   LDLCALC 183 (H) 04/12/2021 1301      Wt Readings from Last 3 Encounters:  10/10/22 154 lb (69.9 kg)  09/26/22 153 lb 6 oz (69.6 kg)  08/24/22 144 lb (65.3 kg)      Other studies Reviewed: Additional studies/ records that were reviewed today include: Extensive review of hospital records. Review of the above records demonstrates:  Please see elsewhere in the note.     ASSESSMENT AND PLAN:  PVD: The patient had acute dissection.  We will continue with aggressive risk reduction.  No change in therapy.  Of interest she did not have any cardiac issues during this urgent hospitalization.  There is no mention on her CTs that she had no significant coronary artery calcification.  No further cardiovascular testing is suggested.  Hypertension: Her blood pressure is well-controlled.  No change in therapy.  Her blood pressure is same in both arms.  Dyslipidemia: I like her to get a lipid profile.  I would suggest a goal LDL in the 50s.  Current medicines are reviewed at length with the patient today.  The patient does not have concerns regarding medicines.  The following changes have been made:  no change  Labs/ tests ordered today include: None  Orders Placed This Encounter  Procedures   Lipid panel   EKG 12-Lead     Disposition:   FU with me in 12 months.  Signed, Minus Breeding, MD  10/10/2022 5:06 PM    [place]

## 2022-10-10 ENCOUNTER — Ambulatory Visit: Payer: BC Managed Care – PPO | Attending: Cardiology | Admitting: Cardiology

## 2022-10-10 ENCOUNTER — Encounter: Payer: Self-pay | Admitting: Cardiology

## 2022-10-10 VITALS — BP 118/68 | HR 86 | Ht 61.0 in | Wt 154.0 lb

## 2022-10-10 DIAGNOSIS — E7800 Pure hypercholesterolemia, unspecified: Secondary | ICD-10-CM | POA: Diagnosis not present

## 2022-10-10 DIAGNOSIS — I10 Essential (primary) hypertension: Secondary | ICD-10-CM

## 2022-10-10 NOTE — Patient Instructions (Signed)
Medication Instructions:  Continue same medications *If you need a refill on your cardiac medications before your next appointment, please call your pharmacy*   Lab Work: Lipid Panel next week   Testing/Procedures: None ordered   Follow-Up: At [facility], you and your health needs are our priority.  As part of our continuing mission to provide you with exceptional heart care, we have created designated Provider Care Teams.  These Care Teams include your primary Cardiologist (physician) and Advanced Practice Providers (APPs -  Physician Assistants and Nurse Practitioners) who all work together to provide you with the care you need, when you need it.  We recommend signing up for the patient portal called "MyChart".  Sign up information is provided on this After Visit Summary.  MyChart is used to connect with patients for Virtual Visits (Telemedicine).  Patients are able to view lab/test results, encounter notes, upcoming appointments, etc.  Non-urgent messages can be sent to your provider as well.   To learn more about what you can do with MyChart, go to [email or web address].    Your next appointment:  1 year   Call in Sept to schedule Dec appointment     The format for your next appointment: Office    Provider: Dr.Hochrein   Important Information About Sugar

## 2022-10-17 DIAGNOSIS — R079 Chest pain, unspecified: Secondary | ICD-10-CM | POA: Diagnosis not present

## 2022-10-17 DIAGNOSIS — K432 Incisional hernia without obstruction or gangrene: Secondary | ICD-10-CM | POA: Diagnosis not present

## 2022-10-17 DIAGNOSIS — Z8679 Personal history of other diseases of the circulatory system: Secondary | ICD-10-CM | POA: Diagnosis not present

## 2022-10-17 DIAGNOSIS — R600 Localized edema: Secondary | ICD-10-CM | POA: Diagnosis not present

## 2022-10-22 ENCOUNTER — Other Ambulatory Visit (HOSPITAL_COMMUNITY): Payer: Self-pay | Admitting: Surgery

## 2022-10-22 DIAGNOSIS — R600 Localized edema: Secondary | ICD-10-CM

## 2022-10-24 ENCOUNTER — Telehealth: Payer: Self-pay

## 2022-10-24 ENCOUNTER — Ambulatory Visit (HOSPITAL_COMMUNITY)
Admission: RE | Admit: 2022-10-24 | Discharge: 2022-10-24 | Disposition: A | Discharge status: Home / Self Care | Payer: BC Managed Care – PPO | Source: Ambulatory Visit | Attending: Family Medicine | Admitting: Family Medicine

## 2022-10-24 DIAGNOSIS — R600 Localized edema: Secondary | ICD-10-CM | POA: Diagnosis not present

## 2022-10-24 NOTE — Progress Notes (Signed)
Bilateral lower extremity venous duplex has been completed. Preliminary results can be found in CV Proc through chart review.  Results were given to [facility] at Dr. Robby Sermon office.  10/24/22 3:10 PM Darlene Griffith RVT

## 2022-10-24 NOTE — Telephone Encounter (Signed)
   Pre-operative Risk Assessment    Patient Name: RAESHAWN TAFOLLA  DOB: 12-22-1950 MRN: [id number]      Request for Surgical Clearance    Procedure:   incisional hernia surgery  Date of Surgery:  Clearance TBD                                 Surgeon:  Dr Louanna Raw Surgeon's Group or Practice Name:  [facility] Phone number:  [phone] Fax number:  [phone]   Type of Clearance Requested:   - Medical       Type of Anesthesia:  Not Indicated   Additional requests/questions:  Please fax a copy of cardiac clearance to the surgeon's office.  Signed, Jeanmarie Plant Tison Leibold  CCMA 10/24/2022, 3:02 PM

## 2022-11-18 NOTE — Progress Notes (Signed)
Fax received on 10/17/22 from [facility] for hernia surgery for vascular clearance to be signed by Dr. Stanford Breed.  Provider signed on 11/18/22, form faxed back to sender, verified successful, sent to scan center.

## 2022-11-21 NOTE — Telephone Encounter (Signed)
General Anesthesia

## 2022-11-24 NOTE — Telephone Encounter (Signed)
   Patient Name: Darlene Griffith  DOB: January 18, 1951 MRN: [id number]  Primary Cardiologist: None  Chart reviewed as part of pre-operative protocol coverage. We received a pre-op clearance for an incisional hernia repair. Patient was recently recently seen by Dr. Percival Spanish in 09/2021. She has no known cardiac history but had a type B aortic dissection in 12/2021 and underwent branched endovascular aortic repair and stenting of the left common and external iliac artery which was complicated by iliac artery rupture requiring left common iliac to common femoral artery bypass. It sounds like she was doing okay from cardiac standpoint at that time but was getting around slowly due to back pain. No further cardiovascular testing was recommended and patient was advised to follow-up in 1 year. I called and spoke with patient today for additional pre-op evaluation. She told me that she is actually not going to be having hernia surgery because she was told that it would be too risky and "too much for her body." If plans change, please let us know and we can provide cardiac risk assessment at that time.   Thank you!  Darreld Mclean, PA-C 11/24/2022, 10:17 AM

## 2022-12-03 NOTE — Progress Notes (Unsigned)
   I, Peterson Lombard, LAT, ATC acting as a scribe for [name], MD.  Darlene Griffith is a 72 y.o. female who presents to [place] [facility] today for cont'd mid-thoracic and lower lumbar back pain following a transcatheter aortic valve replacement and bypass grafting.  During the procedure, she had loss of BP and had chest compressions.  She was last seen by Dr. Georgina Snell on 06/06/22 and a repeat ESI was order and later performed by Dr. Jeralyn Ruths on 04/10/22 (interlaminar thoracic ESI at T11-T12). Pt called the office on 08/08/22 c/o returning thoracic back pain and another ESI was ordered, performed on 08/12/22. Today, pt reports  Dx imaging: 12/21/21 L-spine & T-spine MRI 05/28/18 L hip/pelvis XR  Pertinent review of systems: ***  Relevant historical information: ***   Exam:  There were no vitals taken for this visit. General: Well Developed, well nourished, and in no acute distress.   MSK: ***    Lab and Radiology Results No results found for this or any previous visit (from the past 72 hour(s)). No results found.     Assessment and Plan: 72 y.o. female with ***   PDMP not reviewed this encounter. No orders of the defined types were placed in this encounter.  No orders of the defined types were placed in this encounter.    Discussed warning signs or symptoms. Please see discharge instructions. Patient expresses understanding.   ***

## 2022-12-04 ENCOUNTER — Ambulatory Visit (INDEPENDENT_AMBULATORY_CARE_PROVIDER_SITE_OTHER): Payer: BC Managed Care – PPO | Admitting: Family Medicine

## 2022-12-04 ENCOUNTER — Telehealth: Payer: Self-pay

## 2022-12-04 ENCOUNTER — Ambulatory Visit (INDEPENDENT_AMBULATORY_CARE_PROVIDER_SITE_OTHER): Payer: BC Managed Care – PPO

## 2022-12-04 ENCOUNTER — Encounter: Payer: Self-pay | Admitting: Family Medicine

## 2022-12-04 VITALS — BP 136/80 | HR 87 | Ht 61.0 in | Wt 155.8 lb

## 2022-12-04 DIAGNOSIS — M79662 Pain in left lower leg: Secondary | ICD-10-CM | POA: Diagnosis not present

## 2022-12-04 DIAGNOSIS — M545 Low back pain, unspecified: Secondary | ICD-10-CM

## 2022-12-04 DIAGNOSIS — M546 Pain in thoracic spine: Secondary | ICD-10-CM | POA: Diagnosis not present

## 2022-12-04 DIAGNOSIS — M79605 Pain in left leg: Secondary | ICD-10-CM

## 2022-12-04 NOTE — Patient Instructions (Addendum)
Good to see you Ordered repeat epidural injection X ray on your way out  Follow up in one month

## 2022-12-04 NOTE — Telephone Encounter (Signed)
[employer] form completed, signed by Dr. Georgina Snell, and returned to the front desk for faxing/scanning.   FMLA form completed, signed by Dr. Georgina Snell, and returned to the front desk for faxing/scanning.   Will await accomodation form from employer for leaving work early and regular bathroom breaks.

## 2022-12-04 NOTE — Telephone Encounter (Signed)
Forms received for FMLA.   Pt has appointment today, per pt, should also be receiving accomodation forms.

## 2022-12-05 NOTE — Telephone Encounter (Signed)
Medical accomodation form received.

## 2022-12-08 ENCOUNTER — Telehealth: Payer: Self-pay | Admitting: Family Medicine

## 2022-12-08 DIAGNOSIS — M79605 Pain in left leg: Secondary | ICD-10-CM

## 2022-12-08 DIAGNOSIS — R2242 Localized swelling, mass and lump, left lower limb: Secondary | ICD-10-CM

## 2022-12-08 NOTE — Progress Notes (Signed)
I called and spoke with Hoyle Sauer regarding her results. Plan for MRI tib fib with and without contrast to evaluate the mass seen on MRI

## 2022-12-08 NOTE — Telephone Encounter (Signed)
Called and spoke with Darlene Griffith about her xray results. Plan MRI without and with contrast.

## 2022-12-09 NOTE — Telephone Encounter (Signed)
Form at the front desk for faxing/scanning.

## 2022-12-09 NOTE — Telephone Encounter (Signed)
Work accomodation request form to Dr. Georgina Snell to review and sign.

## 2022-12-11 ENCOUNTER — Ambulatory Visit
Admission: RE | Admit: 2022-12-11 | Discharge: 2022-12-11 | Disposition: A | Discharge status: Home / Self Care | Payer: Medicare Other | Source: Ambulatory Visit | Attending: Family Medicine | Admitting: Family Medicine

## 2022-12-11 DIAGNOSIS — M546 Pain in thoracic spine: Secondary | ICD-10-CM

## 2022-12-11 DIAGNOSIS — M47814 Spondylosis without myelopathy or radiculopathy, thoracic region: Secondary | ICD-10-CM | POA: Diagnosis not present

## 2022-12-11 DIAGNOSIS — M545 Low back pain, unspecified: Secondary | ICD-10-CM

## 2022-12-11 MED ORDER — IOPAMIDOL (ISOVUE-M 300) INJECTION 61%
1.0000 mL | Freq: Once | INTRAMUSCULAR | Status: AC
  Administered 2022-12-11: 1 mL via EPIDURAL

## 2022-12-11 MED ORDER — TRIAMCINOLONE ACETONIDE 40 MG/ML IJ SUSP (RADIOLOGY)
60.0000 mg | Freq: Once | INTRAMUSCULAR | Status: AC
  Administered 2022-12-11: 60 mg via EPIDURAL

## 2022-12-11 NOTE — Discharge Instructions (Signed)

## 2022-12-17 NOTE — Progress Notes (Signed)
Chief Complaint  Patient presents with   Follow-up   HPI: Darlene Griffith is a 72 y.o. female, who is here today for chronic disease management. Last seen on 09/26/22. No new problems sine her last visit. Today she is c/o persistent nasal congestion and productive cough, she reports that over-the-counter medications have not been effective. She recalls being prescribed a medication containing codeine several months ago at Urgent Care, which helped alleviate her symptoms at the time.  Negative for fever,chills,or changes in appetite. States that she quit smoking but not sure when she did so. Occasional wheezing, no SOB. She is not using Combivent respimat.  She was contacted for low dose chest CT, lung cancer screening, she declined appt.  Frequent urination and urine incontinence with urgency. She is not sure if she is taking Myrbetriq, she does not think she is. Denies dysuria,gross hematuria,or decreased urine output.  Since her last visit she has seen surgeon and hernia repair was not recommended due to surgical risk.  Hypertension:  Medications:Carvedilol 25 mg bid, Losartan 25 mg daily,and Amlodipine 10 mg daily.  She is not checking BP at home. Saw cardiologist in 09/2022 and recommended annual follow ups. Negative for unusual or severe headache, visual changes, exertional chest pain, focal weakness, or edema.  Lab Results  Component Value Date   CREATININE 0.54 08/24/2022   BUN 11 08/24/2022   NA 139 08/24/2022   K 3.4 (L) 08/24/2022   CL 105 08/24/2022   CO2 25 08/24/2022   Hyperlipidemia:Currently on Atorvastatin 40 mg daily.  Lab Results  Component Value Date   CHOL 283 (H) 04/12/2021   HDL 60.90 04/12/2021   LDLCALC 183 (H) 04/12/2021   TRIG 130 11/12/2021   CHOLHDL 5 04/12/2021   Lab Results  Component Value Date   ALT 16 12/13/2021   AST 16 12/13/2021   ALKPHOS 115 12/13/2021   BILITOT 0.3 12/13/2021   GERD: She is not sure if she is taking  Protonix 40 mg daily. She has not had heartburn.  Review of Systems  Constitutional:  Positive for fatigue.  HENT:  Positive for congestion and postnasal drip. Negative for mouth sores.   Gastrointestinal:  Negative for nausea and vomiting.  Endocrine: Negative for cold intolerance and heat intolerance.  Genitourinary:  Positive for frequency. Negative for dysuria and hematuria.  Musculoskeletal:  Positive for arthralgias and back pain.  Skin:  Negative for rash.  Allergic/Immunologic: Positive for environmental allergies.  Neurological:  Negative for syncope and facial asymmetry.  See other pertinent positives and negatives in HPI.  Current Outpatient Medications on File Prior to Visit  Medication Sig Dispense Refill   acetaminophen (TYLENOL) 325 MG tablet Take 1-2 tablets (325-650 mg total) by mouth every 4 (four) hours as needed for mild pain.     amLODipine (NORVASC) 10 MG tablet Take 1 tablet by mouth once daily 90 tablet 2   ascorbic acid (VITAMIN C) 1000 MG tablet Take 1 tablet (1,000 mg total) by mouth daily. 30 tablet 0   atorvastatin (LIPITOR) 40 MG tablet Take 1 tablet (40 mg total) by mouth daily. 90 tablet 2   clotrimazole-betamethasone (LOTRISONE) cream Apply to affected area 2 times daily prn 15 g 0   diclofenac Sodium (VOLTAREN) 1 % GEL Apply 4 g topically 4 (four) times daily. 100 g 0   DULoxetine (CYMBALTA) 30 MG capsule Take 1 capsule (30 mg total) by mouth at bedtime. 30 capsule 1   LORazepam (ATIVAN) 0.5 MG tablet 1-2  tabs 30 - 60 min prior to MRI. Do not drive with this medicine. 4 tablet 0   losartan (COZAAR) 25 MG tablet Take 1 tablet (25 mg total) by mouth daily. 90 tablet 1   methimazole (TAPAZOLE) 5 MG tablet Take 0.5 tablets (2.5 mg total) by mouth 3 (three) times daily. Keep appt with endocrinologist 11/2022 90 tablet 3   nitroGLYCERIN (NITROSTAT) 0.4 MG SL tablet Place 1 tablet (0.4 mg total) under the tongue every 5 (five) minutes as needed for chest pain. 30  tablet 12   nitroGLYCERIN (NITROSTAT) 0.4 MG SL tablet DISSOLVE ONE TABLET UNDER THE TONGUE EVERY 5 MINUTES AS NEEDED FOR CHEST PAIN.  DO NOT EXCEED A TOTAL OF 3 DOSES IN 15 MINUTES 25 tablet 0   pantoprazole (PROTONIX) 40 MG tablet Take 1 tablet (40 mg total) by mouth at bedtime. 90 tablet 2   polyethylene glycol (MIRALAX / GLYCOLAX) 17 g packet Take 17 g by mouth daily as needed for mild constipation. 14 each 0   prochlorperazine (COMPAZINE) 5 MG tablet Take 1-2 tablets (5-10 mg total) by mouth every 6 (six) hours as needed for nausea. 30 tablet 0   senna (SENOKOT) 8.6 MG TABS tablet Take 2 tablets (17.2 mg total) by mouth at bedtime. 60 tablet 0   tiZANidine (ZANAFLEX) 4 MG tablet Take 1 tablet (4 mg total) by mouth every 6 (six) hours as needed for muscle spasms. 30 tablet 1   traZODone (DESYREL) 50 MG tablet Take 0.5-1 tablets (25-50 mg total) by mouth at bedtime as needed for sleep. 20 tablet 0   No current facility-administered medications on file prior to visit.   Past Medical History:  Diagnosis Date   Chest pain    a. 01/2015 Echo: Nl LV fxn, Gr 1 DD, triv AI, mild MR.   Essential hypertension    Hepatic cyst    a. noted on CT 01/2015.   Hyperthyroidism    Multinodular goiter    a. 01/2015 CT chest: multinodular goidter w/ substernal extension of the left lobe of the thyroid assoc w/ rightward deviation of tracheal air column.   Neck pain, chronic    Personal history of colonic polyps    Pulmonary nodules    a. 01/2015 CT Chest: RLL ~ 5mm subpleural nodule - rec f/u in 6-12 mos.   Splenic cyst    a. noted on CT 01/2015.    Allergies  Allergen Reactions   Shellfish Allergy Anaphylaxis   Gabapentin     Other reaction(s): Confusion (intolerance)   Hm Lidocaine Patch [Lidocaine] Other (See Comments)    Lidocaine patches caused burning   Tape Other (See Comments)    Pulls skin    Social History   Socioeconomic History   Marital status: Single    Spouse name: Not on file    Number of children: Not on file   Years of education: Not on file   Highest education level: Not on file  Occupational History   Occupation: retired  Tobacco Use   Smoking status: Light Smoker    Types: Cigarettes   Smokeless tobacco: Never   Tobacco comments:    1-2 NOT EVERY DAY   Vaping Use   Vaping Use: Never used  Substance and Sexual Activity   Alcohol use: Yes    Alcohol/week: 0.0 standard drinks of alcohol    Comment: rare   Drug use: No   Sexual activity: Not Currently  Other Topics Concern   Not on file  Social History Narrative   Lives alone.  Four children and eight grands.     Social Determinants of Health   Financial Resource Strain: Low Risk  (12/19/2021)   Overall Financial Resource Strain (CARDIA)    Difficulty of Paying Living Expenses: Not hard at all  Food Insecurity: No Food Insecurity (12/19/2021)   Hunger Vital Sign    Worried About Running Out of Food in the Last Year: Never true    Ran Out of Food in the Last Year: Never true  Transportation Needs: No Transportation Needs (12/19/2021)   PRAPARE - Administrator, Civil Service (Medical): No    Lack of Transportation (Non-Medical): No  Physical Activity: Inactive (12/19/2021)   Exercise Vital Sign    Days of Exercise per Week: 0 days    Minutes of Exercise per Session: 0 min  Stress: No Stress Concern Present (12/19/2021)   [employer] of Occupational Health - Occupational Stress Questionnaire    Feeling of Stress : Not at all  Social Connections: Moderately Integrated (12/19/2021)   Social Connection and Isolation Panel [NHANES]    Frequency of Communication with Friends and Family: More than three times a week    Frequency of Social Gatherings with Friends and Family: More than three times a week    Attends Religious Services: More than 4 times per year    Active Member of [employer] or Organizations: Yes    Attends Banker Meetings: 1 to 4 times per year    Marital Status:  Never married   Today's Vitals   12/19/22 1008  BP: 128/80  Pulse: 91  Resp: 16  SpO2: 94%  Weight: 159 lb (72.1 kg)  Height: 5\' 1"  (1.549 m)   Body mass index is 30.04 kg/m.  Physical Exam Vitals and nursing note reviewed.  Constitutional:      General: She is not in acute distress.    Appearance: She is well-developed.  HENT:     Head: Normocephalic and atraumatic.     Nose: Congestion and rhinorrhea present.     Right Turbinates: Enlarged.     Left Turbinates: Enlarged.     Mouth/Throat:     Mouth: Mucous membranes are moist.     Pharynx: Oropharynx is clear.  Eyes:     Conjunctiva/sclera: Conjunctivae normal.  Cardiovascular:     Rate and Rhythm: Normal rate and regular rhythm.     Pulses:          Posterior tibial pulses are 2+ on the right side and 2+ on the left side.     Heart sounds: Murmur (SEM I-II/VI RUSB and LUSB) heard.  Pulmonary:     Effort: Pulmonary effort is normal. No respiratory distress.     Breath sounds: Normal breath sounds.  Abdominal:     Palpations: Abdomen is soft. There is no mass.     Tenderness: There is no abdominal tenderness.     Hernia: A hernia is present.    Lymphadenopathy:     Cervical: No cervical adenopathy.  Skin:    General: Skin is warm.     Findings: No erythema or rash.  Neurological:     General: No focal deficit present.     Mental Status: She is alert and oriented to person, place, and time.     Cranial Nerves: No cranial nerve deficit.     Gait: Gait normal.  Psychiatric:        Mood and Affect: Mood is anxious.  ASSESSMENT AND PLAN:  Darlene Griffith was seen today for follow-up.  Diagnoses and all orders for this visit:  Essential hypertension Assessment & Plan: BP adequately controlled. Continue Carvedilol 25 mg bid,Amlodipine 10 mg,and Losartan 25 mg daily as well as low salt diet. Monitor BP at home.  Orders: -     Carvedilol; Take 1 tablet (25 mg total) by mouth 2 (two) times daily with a meal.   Dispense: 180 tablet; Refill: 2 -     Basic metabolic panel; Future  Urge incontinence of urine Assessment & Plan: Problem seems to be stable. She is not interested in neurology consultation. Myrbetriq 25 mg sent to her pharmacy to take daily. UA ordered today.  Orders: -     Mirabegron ER; Take 1 tablet (25 mg total) by mouth daily.  Dispense: 90 tablet; Refill: 1 -     Urinalysis, Routine w reflex microscopic; Future  Pure hypercholesterolemia Assessment & Plan: Continue Atorvastatin 40 mg daily and low fat diet. Further recommendations according to FLP result.  Orders: -     Lipid panel; Future -     Hepatic function panel; Future  Centrilobular emphysema (HCC) Assessment & Plan: Recommend  starting  Combivent Respimat 1 puff 3 times daily started today. Continue avoiding tobacco use. CXR ordered today. Recommend calling to re-schedule lung cancer screening.  Orders: -     Combivent Respimat; Inhale 1 puff into the lungs in the morning, at noon, and at bedtime.  Dispense: 4 g; Refill: 4  Seasonal allergic rhinitis, unspecified trigger Assessment & Plan: Nasal saline irrigations as needed, in the morning and at bedtime. Flonase nasal spray daily at bedtime for 10 to 14 days then as needed.  Orders: -     Fluticasone Propionate; Place 2 sprays into both nostrils at bedtime as needed for allergies or rhinitis.  Dispense: 16 g; Refill: 1  Gastroesophageal reflux disease without esophagitis Assessment & Plan: Currently she is asymptomatic. She is not sure if she is taking Protonix 40 mg daily. Continue GERD precautions.   Return in about 6 months (around 06/21/2023) for chronic problems.   Khylei Wilms [place], MD [facility]. [place] office.

## 2022-12-19 ENCOUNTER — Ambulatory Visit (INDEPENDENT_AMBULATORY_CARE_PROVIDER_SITE_OTHER): Payer: BC Managed Care – PPO | Admitting: Family Medicine

## 2022-12-19 ENCOUNTER — Encounter: Payer: Self-pay | Admitting: Family Medicine

## 2022-12-19 VITALS — BP 128/80 | HR 91 | Resp 16 | Ht 61.0 in | Wt 159.0 lb

## 2022-12-19 DIAGNOSIS — I10 Essential (primary) hypertension: Secondary | ICD-10-CM | POA: Diagnosis not present

## 2022-12-19 DIAGNOSIS — K219 Gastro-esophageal reflux disease without esophagitis: Secondary | ICD-10-CM

## 2022-12-19 DIAGNOSIS — J302 Other seasonal allergic rhinitis: Secondary | ICD-10-CM

## 2022-12-19 DIAGNOSIS — J432 Centrilobular emphysema: Secondary | ICD-10-CM

## 2022-12-19 DIAGNOSIS — N3941 Urge incontinence: Secondary | ICD-10-CM | POA: Diagnosis not present

## 2022-12-19 DIAGNOSIS — E7800 Pure hypercholesterolemia, unspecified: Secondary | ICD-10-CM | POA: Diagnosis not present

## 2022-12-19 LAB — URINALYSIS, ROUTINE W REFLEX MICROSCOPIC
Bilirubin Urine: NEGATIVE
Ketones, ur: NEGATIVE
Leukocytes,Ua: NEGATIVE
Nitrite: NEGATIVE
Specific Gravity, Urine: >=1.03 — AB (ref 1.000–1.030)
Total Protein, Urine: NEGATIVE
Urine Glucose: NEGATIVE
Urobilinogen, UA: 0.2 (ref 0.0–1.0)
pH: 6 (ref 5.0–8.0)

## 2022-12-19 MED ORDER — COMBIVENT RESPIMAT 20-100 MCG/ACT IN AERS: 1.0000 | INHALATION_SPRAY | Freq: Three times a day (TID) | RESPIRATORY_TRACT | 4 refills | Status: AC

## 2022-12-19 MED ORDER — FLUTICASONE PROPIONATE 50 MCG/ACT NA SUSP: 2.0000 | Freq: Every evening | NASAL | 1 refills | Status: AC | PRN

## 2022-12-19 MED ORDER — MIRABEGRON ER 25 MG PO TB24: 25.0000 mg | ORAL_TABLET | Freq: Every day | ORAL | 1 refills | Status: DC

## 2022-12-19 MED ORDER — CARVEDILOL 25 MG PO TABS: 25.0000 mg | ORAL_TABLET | Freq: Two times a day (BID) | ORAL | 2 refills | Status: DC

## 2022-12-19 NOTE — Patient Instructions (Addendum)
A few things to remember from today's visit:  Essential hypertension - Plan: carvedilol (COREG) 25 MG tablet, Basic metabolic panel  Centrilobular emphysema ([place]), Chronic - Plan: Ipratropium-Albuterol (COMBIVENT RESPIMAT) 20-100 MCG/ACT AERS respimat  Urge incontinence of urine - Plan: mirabegron ER (MYRBETRIQ) 25 MG TB24 tablet, Urinalysis, Routine w reflex microscopic  Pure hypercholesterolemia - Plan: Hepatic function panel, Lipid panel  Centrilobular emphysema (HCC) - Plan: Ipratropium-Albuterol (COMBIVENT RESPIMAT) 20-100 MCG/ACT AERS respimat  Seasonal allergic rhinitis, unspecified trigger - Plan: fluticasone (FLONASE) 50 MCG/ACT nasal spray  Start Combivent. Plain Mucinex to help with sputum. Nasal saline irrigations as needed, mainly morning and night. Flonase nasal spray in each nostril daily at night for 10-14 days then as needed.  If you need refills for medications you take chronically, please call your pharmacy. Do not use My Chart to request refills or for acute issues that need immediate attention. If you send a my chart message, it may take a few days to be addressed, specially if I am not in the office.  Please be sure medication list is accurate. If a new problem present, please set up appointment sooner than planned today.

## 2022-12-20 ENCOUNTER — Encounter: Payer: Self-pay | Admitting: Family Medicine

## 2022-12-20 NOTE — Assessment & Plan Note (Signed)
BP adequately controlled. Continue Carvedilol 25 mg bid,Amlodipine 10 mg,and Losartan 25 mg daily as well as low salt diet. Monitor BP at home.

## 2022-12-20 NOTE — Assessment & Plan Note (Signed)
Currently she is asymptomatic. She is not sure if she is taking Protonix 40 mg daily. Continue GERD precautions.

## 2022-12-20 NOTE — Assessment & Plan Note (Signed)
Nasal saline irrigations as needed, in the morning and at bedtime. Flonase nasal spray daily at bedtime for 10 to 14 days then as needed.

## 2022-12-20 NOTE — Assessment & Plan Note (Signed)
Recommend  starting  Combivent Respimat 1 puff 3 times daily started today. Continue avoiding tobacco use. CXR ordered today. Recommend calling to re-schedule lung cancer screening.

## 2022-12-20 NOTE — Assessment & Plan Note (Signed)
Problem seems to be stable. She is not interested in neurology consultation. Myrbetriq 25 mg sent to her pharmacy to take daily. UA ordered today.

## 2022-12-20 NOTE — Assessment & Plan Note (Signed)
Continue Atorvastatin 40 mg daily and low fat diet. Further recommendations according to FLP result.

## 2022-12-22 ENCOUNTER — Telehealth: Payer: Self-pay | Admitting: Family Medicine

## 2022-12-22 NOTE — Telephone Encounter (Signed)
Contacted Darlene Griffith to schedule their annual wellness visit. Appointment made for 12/25/22.  Darlene Griffith AWV direct phone # [phone]   Pt called to r/s her appt  conflict with work

## 2022-12-23 ENCOUNTER — Ambulatory Visit
Admission: RE | Admit: 2022-12-23 | Discharge: 2022-12-23 | Disposition: A | Discharge status: Home / Self Care | Payer: BC Managed Care – PPO | Source: Ambulatory Visit | Attending: Family Medicine | Admitting: Family Medicine

## 2022-12-23 DIAGNOSIS — R2242 Localized swelling, mass and lump, left lower limb: Secondary | ICD-10-CM | POA: Diagnosis not present

## 2022-12-23 DIAGNOSIS — M79605 Pain in left leg: Secondary | ICD-10-CM

## 2022-12-23 MED ORDER — GADOPICLENOL 0.5 MMOL/ML IV SOLN
7.0000 mL | Freq: Once | INTRAVENOUS | Status: AC | PRN
  Administered 2022-12-23: 7 mL via INTRAVENOUS

## 2022-12-24 NOTE — Progress Notes (Signed)
Good news.  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  This is a benign tumor.  This means it is not cancer and will not spread across her body.  It is pretty big at 8.5 x 3 x 5 cm.  It is also right in the middle of the calf muscle so it could be get in the way and causing problems.  This is going to be a situation where if it is not bothering that much you should leave it alone but if it really bothers you a lot surgeon could remove it.  Recommend return to clinic to talk about the results of the MRI and treatment plan and options going forward.

## 2022-12-25 ENCOUNTER — Encounter: Payer: BC Managed Care – PPO | Admitting: Family Medicine

## 2022-12-25 NOTE — Progress Notes (Signed)
error 

## 2022-12-25 NOTE — Patient Instructions (Signed)
I really enjoyed getting to talk with you today! I am available on Tuesdays and Thursdays for virtual visits if you have any questions or concerns, or if I can be of any further assistance.   CHECKLIST FROM ANNUAL WELLNESS VISIT:  -Follow up (please call to schedule if not scheduled after visit):  -Inperson visit with your Primary Doctor office: -yearly for annual wellness visit with primary care office  Here is a list of your preventive care/health maintenance measures and the plan for each if any are due:  Health Maintenance  Topic Date Due   MAMMOGRAM  Never done   DEXA SCAN  Never done   COLONOSCOPY (Pts 45-31yr Insurance coverage will need to be confirmed)  01/12/2021   COVID-19 Vaccine (1) 01/04/2023 (Originally 08/21/1951)   INFLUENZA VACCINE  01/18/2023 (Originally 05/20/2022)   Zoster Vaccines- Shingrix (1 of 2) 10/22/2023 (Originally [birth year]/2001)   Medicare Annual Wellness (AWV)  12/25/2023   Pneumonia Vaccine 72 Years old  Completed   Hepatitis C Screening  Completed   HPV VACCINES  Aged Out   DTaP/Tdap/Td  Discontinued    -See a dentist at least yearly  -Get your eyes checked and then per your eye specialist's recommendations  -Other issues addressed today: -  -I have included below further information regarding a healthy whole foods based diet, physical activity guidelines for adults, stress management and opportunities for social connections. I hope you find this information useful.   -----------------------------------------------------------------------------------------------------------------------------------------------------------------------------------------------------------------------------------------------------------  NUTRITION: -eat real food: lots of colorful vegetables (half the plate) and fruits -5-7 servings of vegetables and fruits per day (fresh or steamed is best), exp. 2 servings of vegetables with lunch and dinner and 2 servings of fruit per  day. Berries and greens such as kale and collards are great choices.  -consume on a regular basis: whole grains (make sure first ingredient on label contains the word "whole"), fresh fruits, fish, nuts, seeds, healthy oils (such as olive oil, avocado oil, grape seed oil) -may eat small amounts of dairy and lean meat on occasion, but avoid processed meats such as ham, bacon, lunch meat, etc. -drink water -try to avoid fast food and pre-packaged foods, processed meat -most experts advise limiting sodium to < '2300mg'$  per day, should limit further is any chronic conditions such as high blood pressure, heart disease, diabetes, etc. The American Heart Association advised that < '1500mg'$  is is ideal -try to avoid foods that contain any ingredients with names you do not recognize  -try to avoid sugar/sweets (except for the natural sugar that occurs in fresh fruit) -try to avoid sweet drinks -try to avoid white rice, white bread, pasta (unless whole grain), white or yellow potatoes  EXERCISE GUIDELINES FOR ADULTS: -if you wish to increase your physical activity, do so gradually and with the approval of your doctor -STOP and seek medical care immediately if you have any chest pain, chest discomfort or trouble breathing when starting or increasing exercise  -move and stretch your body, legs, feet and arms when sitting for long periods -Physical activity guidelines for optimal health in adults: -least 150 minutes per week of aerobic exercise (can talk, but not sing) once approved by your doctor, 20-30 minutes of sustained activity or two 10 minute episodes of sustained activity every day.  -resistance training at least 2 days per week if approved by your doctor -balance exercises 3+ days per week:   Stand somewhere where you have something sturdy to hold onto if you lose balance.  1) lift up on toes, start with 5x per day and work up to 20x   2) stand and lift on leg straight out to the side so that foot is  a few inches of the floor, start with 5x each side and work up to 20x each side   3) stand on one foot, start with 5 seconds each side and work up to 20 seconds on each side  If you need ideas or help with getting more active:  -Silver sneakers https://tools.silversneakers.com  -Walk with a Doc: http://stephens-thompson.biz/  -try to include resistance (weight lifting/strength building) and balance exercises twice per week: or the following link for ideas: ChessContest.fr  UpdateClothing.com.cy  STRESS MANAGEMENT: -can try meditating, or just sitting quietly with deep breathing while intentionally relaxing all parts of your body for 5 minutes daily -if you need further help with stress, anxiety or depression please follow up with your primary doctor or contact the wonderful folks at [place]: [place]: -options in [place] if you wish to engage in more social and exercise related activities:  -Silver sneakers https://tools.silversneakers.com  -Walk with a Doc: http://stephens-thompson.biz/  -Check out the [place] 50+ section on the [place] [employer] (hiking clubs, book clubs, cards and games, chess, exercise classes, aquatic classes and much more) - see the website for details: https://www.[place].gov/departments/parks-recreation/active-adults50  -YouTube has lots of exercise videos for different ages and abilities as well  -[place] (a variety of indoor and outdoor inperson activities for adults). (367)791-5939. [address]  -Virtual Online Classes (a variety of topics): see seniorplanet.org or call 905-618-4494  -consider volunteering at a school, hospice center, church, senior center or elsewhere

## 2022-12-26 ENCOUNTER — Other Ambulatory Visit: Payer: BC Managed Care – PPO

## 2022-12-26 DIAGNOSIS — K432 Incisional hernia without obstruction or gangrene: Secondary | ICD-10-CM | POA: Diagnosis not present

## 2022-12-29 ENCOUNTER — Telehealth: Payer: Self-pay | Admitting: Family Medicine

## 2022-12-29 NOTE — Telephone Encounter (Signed)
Called patient to schedule Medicare Annual Wellness Visit (AWV). Left message for patient to call back and schedule Medicare Annual Wellness Visit (AWV).  Last date of AWV: 12/19/21  Please schedule an appointment at any time with [facility] or hannah kim.  If any questions, please contact me at [phone].  Thank you ,  Barkley Boards AWV direct phone # [phone]

## 2023-01-01 ENCOUNTER — Ambulatory Visit: Payer: BC Managed Care – PPO | Admitting: Family Medicine

## 2023-01-01 NOTE — Progress Notes (Deleted)
   Shirlyn Goltz, PhD, LAT, ATC acting as a scribe for [name], MD.  Darlene Griffith is a 72 y.o. female who presents to [place] at [facility] today for 83-month f/u L shin pain w/ MRI review and mid-thoracic and lower lumbar back pain following a transcatheter aortic valve replacement and bypass grafting.  During the procedure, she had loss of BP and had chest compressions.  She was last seen by Dr. Georgina Snell on 12/04/22 and a repeat ESI was ordered and later performed on 12/11/22. Based on L tib/fib XR findings a MRI was ordered. Today, pt reports ***  Dx imaging: 12/23/22 L tib/fib MRI 12/04/22 L tib/fib XR 12/21/21 L-spine & T-spine MRI 05/28/18 L hip/pelvis XR  Pertinent review of systems: ***  Relevant historical information: ***   Exam:  There were no vitals taken for this visit. General: Well Developed, well nourished, and in no acute distress.   MSK: ***    Lab and Radiology Results No results found for this or any previous visit (from the past 72 hour(s)). No results found.     Assessment and Plan: 72 y.o. female with ***   PDMP not reviewed this encounter. No orders of the defined types were placed in this encounter.  No orders of the defined types were placed in this encounter.    Discussed warning signs or symptoms. Please see discharge instructions. Patient expresses understanding.   ***

## 2023-01-04 ENCOUNTER — Emergency Department (HOSPITAL_BASED_OUTPATIENT_CLINIC_OR_DEPARTMENT_OTHER): Payer: BC Managed Care – PPO | Admitting: Radiology

## 2023-01-04 ENCOUNTER — Emergency Department (HOSPITAL_BASED_OUTPATIENT_CLINIC_OR_DEPARTMENT_OTHER): Payer: BC Managed Care – PPO

## 2023-01-04 ENCOUNTER — Other Ambulatory Visit: Payer: Self-pay

## 2023-01-04 ENCOUNTER — Emergency Department (HOSPITAL_BASED_OUTPATIENT_CLINIC_OR_DEPARTMENT_OTHER)
Admission: EM | Admit: 2023-01-04 | Discharge: 2023-01-04 | Disposition: A | Discharge status: Home / Self Care | Payer: BC Managed Care – PPO | Attending: Emergency Medicine | Admitting: Emergency Medicine

## 2023-01-04 ENCOUNTER — Encounter (HOSPITAL_BASED_OUTPATIENT_CLINIC_OR_DEPARTMENT_OTHER): Payer: Self-pay

## 2023-01-04 DIAGNOSIS — R6889 Other general symptoms and signs: Secondary | ICD-10-CM | POA: Diagnosis not present

## 2023-01-04 DIAGNOSIS — I10 Essential (primary) hypertension: Secondary | ICD-10-CM | POA: Insufficient documentation

## 2023-01-04 DIAGNOSIS — R0789 Other chest pain: Secondary | ICD-10-CM | POA: Diagnosis not present

## 2023-01-04 DIAGNOSIS — R079 Chest pain, unspecified: Secondary | ICD-10-CM | POA: Diagnosis not present

## 2023-01-04 DIAGNOSIS — Z79899 Other long term (current) drug therapy: Secondary | ICD-10-CM | POA: Diagnosis not present

## 2023-01-04 DIAGNOSIS — Z743 Need for continuous supervision: Secondary | ICD-10-CM | POA: Diagnosis not present

## 2023-01-04 DIAGNOSIS — I700 Atherosclerosis of aorta: Secondary | ICD-10-CM | POA: Diagnosis not present

## 2023-01-04 DIAGNOSIS — I712 Thoracic aortic aneurysm, without rupture, unspecified: Secondary | ICD-10-CM | POA: Diagnosis not present

## 2023-01-04 LAB — CBC
HCT: 37.2 % (ref 36.0–46.0)
Hemoglobin: 12.1 g/dL (ref 12.0–15.0)
MCH: 34.4 pg — ABNORMAL HIGH (ref 26.0–34.0)
MCHC: 32.5 g/dL (ref 30.0–36.0)
MCV: 105.7 fL — ABNORMAL HIGH (ref 80.0–100.0)
Platelets: 227 10*3/uL (ref 150–400)
RBC: 3.52 MIL/uL — ABNORMAL LOW (ref 3.87–5.11)
RDW: 12.5 % (ref 11.5–15.5)
WBC: 7.5 10*3/uL (ref 4.0–10.5)
nRBC: 0 % (ref 0.0–0.2)

## 2023-01-04 LAB — BASIC METABOLIC PANEL
Anion gap: 9 (ref 5–15)
BUN: 9 mg/dL (ref 8–23)
CO2: 24 mmol/L (ref 22–32)
Calcium: 9.2 mg/dL (ref 8.9–10.3)
Chloride: 106 mmol/L (ref 98–111)
Creatinine, Ser: 0.55 mg/dL (ref 0.44–1.00)
GFR, Estimated: >60 mL/min (ref >60)
Glucose, Bld: 125 mg/dL — ABNORMAL HIGH (ref 70–99)
Potassium: 3.2 mmol/L — ABNORMAL LOW (ref 3.5–5.1)
Sodium: 139 mmol/L (ref 135–145)

## 2023-01-04 LAB — TROPONIN I (HIGH SENSITIVITY)
Troponin I (High Sensitivity): 3 ng/L (ref <18)
Troponin I (High Sensitivity): 4 ng/L (ref <18)

## 2023-01-04 MED ORDER — NITROGLYCERIN 0.4 MG SL SUBL: 0.4000 mg | SUBLINGUAL_TABLET | SUBLINGUAL | 0 refills | Status: AC | PRN

## 2023-01-04 MED ORDER — IOHEXOL 350 MG/ML SOLN
100.0000 mL | Freq: Once | INTRAVENOUS | Status: AC | PRN
  Administered 2023-01-04: 100 mL via INTRAVENOUS

## 2023-01-04 NOTE — ED Provider Notes (Signed)
[facility] Provider Note   CSN: [id number] Arrival date & time: 01/04/23  1427     History  Chief Complaint  Patient presents with   Chest Pain    Darlene Griffith is a 73 y.o. female.  Patient here after episode of chest discomfort.  Now resolved.  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  She was eaten shrimp when this happened.  She denies any chest pain episodes otherwise.  She has been in her normal state of health last few months.  She has a history of high blood pressure.  Overall vascular surgery repaired dissection last year she has been doing well.  Denies any nausea vomiting or abdominal pain.  No shortness of breath.  The history is provided by the patient.       Home Medications Prior to Admission medications   Medication Sig Start Date End Date Taking? Authorizing Provider  acetaminophen (TYLENOL) 325 MG tablet Take 1-2 tablets (325-650 mg total) by mouth every 4 (four) hours as needed for mild pain. 12/03/21   Setzer, Edman Circle, PA-C  amLODipine (NORVASC) 10 MG tablet Take 1 tablet by mouth once daily 10/06/22   Martinique, Betty G, MD  ascorbic acid (VITAMIN C) 1000 MG tablet Take 1 tablet (1,000 mg total) by mouth daily. 03/18/22   Martin[place], Betty G, MD  atorvastatin (LIPITOR) 40 MG tablet Take 1 tablet (40 mg total) by mouth daily. 03/18/22   Martin[place], Betty G, MD  carvedilol (COREG) 25 MG tablet Take 1 tablet (25 mg total) by mouth 2 (two) times daily with a meal. 12/19/22   Martin[place], Betty G, MD  clotrimazole-betamethasone Donalynn Furlong) cream Apply to affected area 2 times daily prn 04/24/20   Raylene Everts, MD  diclofenac Sodium (VOLTAREN) 1 % GEL Apply 4 g topically 4 (four) times daily. 04/28/21   Deno Etienne, DO  DULoxetine (CYMBALTA) 30 MG capsule Take 1 capsule (30 mg total) by mouth at bedtime. 01/02/22   Raulkar, Clide Deutscher, MD  fluticasone (FLONASE) 50 MCG/ACT nasal spray Place 2 sprays into both nostrils at bedtime  as needed for allergies or rhinitis. 12/19/22   Martin[place], Betty G, MD  Ipratropium-Albuterol (COMBIVENT RESPIMAT) 20-100 MCG/ACT AERS respimat Inhale 1 puff into the lungs in the morning, at noon, and at bedtime. 12/19/22   Martin[place], Betty G, MD  LORazepam (ATIVAN) 0.5 MG tablet 1-2 tabs 30 - 60 min prior to MRI. Do not drive with this medicine. 12/18/21   Gregor Hams, MD  losartan (COZAAR) 25 MG tablet Take 1 tablet (25 mg total) by mouth daily. 03/18/22   Martinique, Betty G, MD  methimazole (TAPAZOLE) 5 MG tablet Take 0.5 tablets (2.5 mg total) by mouth 3 (three) times daily. Keep appt with endocrinologist 11/2022 09/30/22   Martinique, Betty G, MD  mirabegron ER (MYRBETRIQ) 25 MG TB24 tablet Take 1 tablet (25 mg total) by mouth daily. 12/19/22   Martin[place], Betty G, MD  nitroGLYCERIN (NITROSTAT) 0.4 MG SL tablet Place 1 tablet (0.4 mg total) under the tongue every 5 (five) minutes as needed for chest pain. 01/04/23   Nachelle Negrette, DO  pantoprazole (PROTONIX) 40 MG tablet Take 1 tablet (40 mg total) by mouth at bedtime. 03/18/22   Martin[place], Betty G, MD  polyethylene glycol (MIRALAX / GLYCOLAX) 17 g packet Take 17 g by mouth daily as needed for mild constipation. 12/03/21   Setzer, Edman Circle, PA-C  prochlorperazine (COMPAZINE) 5 MG tablet Take  1-2 tablets (5-10 mg total) by mouth every 6 (six) hours as needed for nausea. 12/03/21   Setzer, Edman Circle, PA-C  senna (SENOKOT) 8.6 MG TABS tablet Take 2 tablets (17.2 mg total) by mouth at bedtime. 12/03/21   Setzer, Edman Circle, PA-C  tiZANidine (ZANAFLEX) 4 MG tablet Take 1 tablet (4 mg total) by mouth every 6 (six) hours as needed for muscle spasms. 01/28/22   Gregor Hams, MD  traZODone (DESYREL) 50 MG tablet Take 0.5-1 tablets (25-50 mg total) by mouth at bedtime as needed for sleep. 12/25/21   Gregor Hams, MD      Allergies    Shellfish allergy, Gabapentin, Hm lidocaine patch [lidocaine], and Tape    Review of Systems   Review of Systems  Physical Exam Updated Vital Signs BP  (!) 154/91 (BP Location: Right Arm)   Pulse 81   Temp 98 F (36.7 C) (Oral)   Resp 18   Ht 5\' 1"  (1.549 m)   Wt 72.1 kg   SpO2 95%   BMI 30.03 kg/m  Physical Exam Vitals and nursing note reviewed.  Constitutional:      General: She is not in acute distress.    Appearance: She is well-developed. She is not ill-appearing.  HENT:     Head: Normocephalic and atraumatic.  Eyes:     Extraocular Movements: Extraocular movements intact.     Conjunctiva/sclera: Conjunctivae normal.     Pupils: Pupils are equal, round, and reactive to light.  Cardiovascular:     Rate and Rhythm: Normal rate and regular rhythm.     Pulses:          Radial pulses are 2+ on the right side and 2+ on the left side.     Heart sounds: No murmur heard. Pulmonary:     Effort: Pulmonary effort is normal. No respiratory distress.     Breath sounds: Normal breath sounds.  Abdominal:     Palpations: Abdomen is soft.     Tenderness: There is no abdominal tenderness.  Musculoskeletal:        General: No swelling.     Cervical back: Normal range of motion and neck supple.     Right lower leg: No edema.     Left lower leg: No edema.  Skin:    General: Skin is warm and dry.     Capillary Refill: Capillary refill takes less than 2 seconds.  Neurological:     Mental Status: She is alert.  Psychiatric:        Mood and Affect: Mood normal.     ED Results / Procedures / Treatments   Labs (all labs ordered are listed, but only abnormal results are displayed) Labs Reviewed  BASIC METABOLIC PANEL - Abnormal; Notable for the following components:      Result Value   Potassium 3.2 (*)    Glucose, Bld 125 (*)    All other components within normal limits  CBC - Abnormal; Notable for the following components:   RBC 3.52 (*)    MCV 105.7 (*)    MCH 34.4 (*)    All other components within normal limits  TROPONIN I (HIGH SENSITIVITY)  TROPONIN I (HIGH SENSITIVITY)    EKG EKG Interpretation  Date/Time:  Sunday  January 04 2023 14:38:02 EDT Ventricular Rate:  81 PR Interval:  200 QRS Duration: 78 QT Interval:  372 QTC Calculation: 432 R Axis:   74 Text Interpretation: Normal sinus rhythm Septal infarct , age  undetermined Abnormal ECG When compared with ECG of 24-Aug-2022 10:51, PREVIOUS ECG IS PRESENT Confirmed by Lennice Sites [phone]) on 01/04/2023 3:07:12 PM  Radiology CT Angio Chest/Abd/Pel for Dissection W and/or Wo Contrast  Result Date: 01/04/2023 CLINICAL DATA:  Acute aortic syndrome suspected history of descending thoracic aortic dissection and stent endograft repair EXAM: CT ANGIOGRAPHY CHEST, ABDOMEN AND PELVIS TECHNIQUE: Non-contrast CT of the chest was initially obtained. Multidetector CT imaging through the chest, abdomen and pelvis was performed using the standard protocol during bolus administration of intravenous contrast. Multiplanar reconstructed images and MIPs were obtained and reviewed to evaluate the vascular anatomy. RADIATION DOSE REDUCTION: This exam was performed according to the departmental dose-optimization program which includes automated exposure control, adjustment of the mA and/or kV according to patient size and/or use of iterative reconstruction technique. CONTRAST:  115mL OMNIPAQUE IOHEXOL 350 MG/ML SOLN COMPARISON:  07/14/2022 FINDINGS: CTA CHEST FINDINGS VASCULAR Aorta: Satisfactory opacification of the aorta. Unchanged contour and caliber of the thoracic aorta status post stent endograft repair of the arch and descending thoracic aorta, without evidence of endoleak or other complication on today's examination. There is bovine type two-vessel branching of the thoracic aorta, and proximal stent landing site is just distal to the conjoint origin of the innominate and left carotid arteries, distal landing site at the level of the diaphragmatic hiatus. Additional stenting of the left subclavian artery origin. Cardiovascular: No evidence of pulmonary embolism on limited non-tailored  examination. Normal heart size. No pericardial effusion. Review of the MIP images confirms the above findings. NON VASCULAR Mediastinum/Nodes: No enlarged mediastinal, hilar, or axillary lymph nodes. Heterogeneous thyroid goiter, with unchanged large exophytic thyroid nodule extending from the inferior pole of the left lobe of the thyroid well into the middle and posterior mediastinum, deflecting the trachea rightward (series 5, image 46) trachea and esophagus otherwise demonstrate no significant findings. Lungs/Pleura: Mild diffuse bilateral bronchial wall. No pleural effusion or pneumothorax. Musculoskeletal: No chest wall abnormality. No acute osseous findings. Review of the MIP images confirms the above findings. CTA ABDOMEN AND PELVIS FINDINGS VASCULAR Unchanged contour and caliber of the abdominal aorta. Dissection flap, presumably contiguous with the stented lower thoracic aorta arises just below the diaphragmatic hiatus and extends into the right common iliac artery and terminates at the origin of the right external iliac artery. The true aortic lumen supplies all aortic branch vessels, and there is standard branching pattern of the abdominal aorta with solitary bilateral renal arteries. There is additionally a small focal dissection or penetrating ulceration of the anterior right aspect of the aorta at the level of the diaphragmatic hiatus, measuring 0.7 cm, unchanged (series 5, image 137). Maximum caliber of the abdominal aorta is 2.5 x 2.2 cm proximally (series 5, image 156). Mild, scattered calcific aortic atherosclerosis. Review of the MIP images confirms the above findings. NON-VASCULAR Hepatobiliary: No solid liver abnormality is seen. No gallstones, gallbladder wall thickening, or biliary dilatation. Pancreas: Unremarkable. No pancreatic ductal dilatation or surrounding inflammatory changes. Spleen: Normal in size without significant abnormality. Adrenals/Urinary Tract: Adrenal glands are  unremarkable. Kidneys are normal, without renal calculi, solid lesion, or hydronephrosis. Bladder is unremarkable. Stomach/Bowel: Stomach is within normal limits. Appendix is not clearly visualized. No evidence of bowel wall thickening, distention, or inflammatory changes. Sigmoid diverticula. Lymphatic: No enlarged abdominal or pelvic lymph nodes. Reproductive: No mass or other significant abnormality. Other: Broad-based fat and bowel containing left lower quadrant abdominal wall hernia (series 5, image 225). No ascites. Musculoskeletal: No acute osseous findings. IMPRESSION:  1. Unchanged contour and caliber of the thoracic aorta status post stent endograft repair of the arch and descending thoracic aorta, without evidence of endoleak or other complication on today's examination. 2. Unchanged contour and caliber of the abdominal aorta. Dissection flap, presumably contiguous with the stented lower thoracic aorta arises just below the diaphragmatic hiatus and extends into the right common iliac artery and terminates at the origin of the right external iliac artery. The true aortic lumen supplies all aortic branch vessels, and there is standard branching pattern of the abdominal aorta with solitary bilateral renal arteries. Maximum caliber of the abdominal aorta is 2.5 x 2.2 cm proximally. 3. There is additionally a small focal dissection or penetrating ulceration of the anterior right aspect of the aorta at the level of the diaphragmatic hiatus, measuring 0.7 cm, unchanged. 4. Heterogeneous thyroid goiter, with unchanged large exophytic thyroid nodule extending from the inferior pole of the left lobe of the thyroid well into the middle and posterior mediastinum, deflecting the trachea rightward. 5. Sigmoid diverticula without evidence of acute diverticulitis. 6. Broad-based fat and bowel containing left lower quadrant abdominal wall hernia. Aortic Atherosclerosis (ICD10-I70.0). Electronically Signed   By: Delanna Ahmadi M.D.   On: 01/04/2023 19:15   DG Chest 2 View  Result Date: 01/04/2023 CLINICAL DATA:  Left-sided chest pain and some shortness-of-breath. EXAM: CHEST - 2 VIEW COMPARISON:  08/24/2022 FINDINGS: Lungs are adequately inflated without focal airspace consolidation or effusion. Cardiomediastinal silhouette is unremarkable. Aortic stent graft unchanged. Remainder the exam is unchanged. IMPRESSION: No active cardiopulmonary disease. Electronically Signed   By: Marin Olp M.D.   On: 01/04/2023 15:27    Procedures Procedures    Medications Ordered in ED Medications  iohexol (OMNIPAQUE) 350 MG/ML injection 100 mL (100 mLs Intravenous Contrast Given 01/04/23 1808)    ED Course/ Medical Decision Making/ A&P                             Medical Decision Making Amount and/or Complexity of Data Reviewed Labs: ordered. Radiology: ordered.  Risk Prescription drug management.   Aryaa Ventry Enoch is here with chest pain.  History of hypertension.  She had thoracic aortic endovascular stent graft placed last year after dissection.  Per chart review she had a repeat scan about 6 months ago that was fairly stable.  He has not had any chest pain episodes or any symptoms for several months.  She is eating shrimp when this occurred.  Differential diagnosis likely reflux related pain but will evaluate for ACS, dissection, may be infectious process.  Will get CBC, BMP, troponin, EKG, CT dissection study.  EKG shows sinus rhythm.  No ischemic changes.  Initial troponin is normal.  Lab work otherwise per my review and interpretation is unremarkable.  Chest x-ray per my review interpretation shows no evidence of pneumonia or pneumothorax.  Awaiting CT dissection study repeat troponin.  Repeat troponin normal.  Dissection study overall unremarkable.  Unchanged from CT back in September.  Overall she had brief episode of discomfort.  My suspicion is that this is GI related.  She will follow-up with her primary  care doctors.  She will have her nitroglycerin medication refilled.  Discharged in good condition.  This chart was dictated using voice recognition software.  Despite best efforts to proofread,  errors can occur which can change the documentation meaning.         Final Clinical Impression(s) / ED Diagnoses  Final diagnoses:  Nonspecific chest pain    Rx / DC Orders ED Discharge Orders          Ordered    nitroGLYCERIN (NITROSTAT) 0.4 MG SL tablet  Every 5 min PRN        01/04/23 1924              Lennice Sites, DO 01/04/23 1925

## 2023-01-04 NOTE — ED Triage Notes (Addendum)
Patient here POV from Home.  Endorses Left Sided CP that began 1 Hour ago. Some SOB. Pain has somewhat eased up but is still present. Did not take NTG for which she is prescribed as she ran out.    EMS Responded and performed Initial Assessment but refused Transportation.   NAD Noted during Triage. A&Ox4. GCS 15. BIB Wheelchair.

## 2023-01-07 ENCOUNTER — Telehealth: Payer: Self-pay

## 2023-01-07 NOTE — Telephone Encounter (Signed)
        Patient  visited [facility] on 01/04/2023  for chest pain.   Telephone encounter attempt :  1st  A HIPAA compliant voice message was left requesting a return call.  Instructed patient to call back at [phone].   [facility] Resource Care Guide   [email or web address]  ?? [phone]   [email or web address]: [email or web address]

## 2023-01-08 ENCOUNTER — Ambulatory Visit (INDEPENDENT_AMBULATORY_CARE_PROVIDER_SITE_OTHER): Payer: BC Managed Care – PPO

## 2023-01-08 ENCOUNTER — Encounter: Payer: Self-pay | Admitting: Family Medicine

## 2023-01-08 ENCOUNTER — Ambulatory Visit (INDEPENDENT_AMBULATORY_CARE_PROVIDER_SITE_OTHER): Payer: BC Managed Care – PPO | Admitting: Family Medicine

## 2023-01-08 ENCOUNTER — Telehealth: Payer: Self-pay

## 2023-01-08 VITALS — BP 102/62 | HR 85 | Ht 61.0 in | Wt 158.0 lb

## 2023-01-08 DIAGNOSIS — M48062 Spinal stenosis, lumbar region with neurogenic claudication: Secondary | ICD-10-CM | POA: Diagnosis not present

## 2023-01-08 DIAGNOSIS — M545 Low back pain, unspecified: Secondary | ICD-10-CM | POA: Diagnosis not present

## 2023-01-08 DIAGNOSIS — M79605 Pain in left leg: Secondary | ICD-10-CM

## 2023-01-08 NOTE — Progress Notes (Signed)
I, Darlene Griffith, CMA acting as a Education administrator for Darlene Leader, MD.  Darlene Griffith is a 72 y.o. female who presents to Darlene Griffith at [facility] today for 64-month f/u L shin pain w/ MRI review and mid-thoracic and lower lumbar back pain following a transcatheter aortic valve replacement and bypass grafting.  During the procedure, she had loss of BP and had chest compressions.  She was last seen by Darlene Griffith on 12/04/22 and a repeat ESI was ordered and later performed on 12/11/22. Based on L tib/fib XR findings a MRI was ordered. Today, pt reports continued pain in the lower back and B LE. Throbbing in the LE. Has paperwork today for Employer Accomodation. Notes getting significant relief after most recent ESI, has been able to do chores around the house and stand long enough to cook a meal.   She notes she was having significant pain bilateral lower legs left worse than right.  Pain occurs with rest and with exertion.  Pain the other day was very severe.  Dx imaging: 12/23/22 L tib/fib MRI 12/04/22 L tib/fib XR 12/21/21 L-spine & T-spine MRI 05/28/18 L hip/pelvis XR   Pertinent review of systems: No fevers or chills.  Relevant historical information: History of aortic dissection.   Exam:  BP 102/62   Pulse 85   Ht 5\' 1"  (1.549 m)   Wt 158 lb (71.7 kg)   SpO2 97%   BMI 29.85 kg/m  General: Well Developed, well nourished, and in no acute distress.   MSK: Legs bilaterally normal-appearing nontender.  Pulses intact distally.  Capillary fill intact distally.    Lab and Radiology Results No results found for this or any previous visit (from the past 72 hour(s)). CT Angio Chest/Abd/Pel for Dissection W and/or Wo Contrast  Result Date: 01/04/2023 CLINICAL DATA:  Acute aortic syndrome suspected history of descending thoracic aortic dissection and stent endograft repair EXAM: CT ANGIOGRAPHY CHEST, ABDOMEN AND PELVIS TECHNIQUE: Non-contrast CT of the chest was initially obtained.  Multidetector CT imaging through the chest, abdomen and pelvis was performed using the standard protocol during bolus administration of intravenous contrast. Multiplanar reconstructed images and MIPs were obtained and reviewed to evaluate the vascular anatomy. RADIATION DOSE REDUCTION: This exam was performed according to the departmental dose-optimization program which includes automated exposure control, adjustment of the mA and/or kV according to patient size and/or use of iterative reconstruction technique. CONTRAST:  127mL OMNIPAQUE IOHEXOL 350 MG/ML SOLN COMPARISON:  07/14/2022 FINDINGS: CTA CHEST FINDINGS VASCULAR Aorta: Satisfactory opacification of the aorta. Unchanged contour and caliber of the thoracic aorta status post stent endograft repair of the arch and descending thoracic aorta, without evidence of endoleak or other complication on today's examination. There is bovine type two-vessel branching of the thoracic aorta, and proximal stent landing site is just distal to the conjoint origin of the innominate and left carotid arteries, distal landing site at the level of the diaphragmatic hiatus. Additional stenting of the left subclavian artery origin. Cardiovascular: No evidence of pulmonary embolism on limited non-tailored examination. Normal heart size. No pericardial effusion. Review of the MIP images confirms the above findings. NON VASCULAR Mediastinum/Nodes: No enlarged mediastinal, hilar, or axillary lymph nodes. Heterogeneous thyroid goiter, with unchanged large exophytic thyroid nodule extending from the inferior pole of the left lobe of the thyroid well into the middle and posterior mediastinum, deflecting the trachea rightward (series 5, image 46) trachea and esophagus otherwise demonstrate no significant findings. Lungs/Pleura: Mild diffuse bilateral bronchial wall.  No pleural effusion or pneumothorax. Musculoskeletal: No chest wall abnormality. No acute osseous findings. Review of the MIP  images confirms the above findings. CTA ABDOMEN AND PELVIS FINDINGS VASCULAR Unchanged contour and caliber of the abdominal aorta. Dissection flap, presumably contiguous with the stented lower thoracic aorta arises just below the diaphragmatic hiatus and extends into the right common iliac artery and terminates at the origin of the right external iliac artery. The true aortic lumen supplies all aortic branch vessels, and there is standard branching pattern of the abdominal aorta with solitary bilateral renal arteries. There is additionally a small focal dissection or penetrating ulceration of the anterior right aspect of the aorta at the level of the diaphragmatic hiatus, measuring 0.7 cm, unchanged (series 5, image 137). Maximum caliber of the abdominal aorta is 2.5 x 2.2 cm proximally (series 5, image 156). Mild, scattered calcific aortic atherosclerosis. Review of the MIP images confirms the above findings. NON-VASCULAR Hepatobiliary: No solid liver abnormality is seen. No gallstones, gallbladder wall thickening, or biliary dilatation. Pancreas: Unremarkable. No pancreatic ductal dilatation or surrounding inflammatory changes. Spleen: Normal in size without significant abnormality. Adrenals/Urinary Tract: Adrenal glands are unremarkable. Kidneys are normal, without renal calculi, solid lesion, or hydronephrosis. Bladder is unremarkable. Stomach/Bowel: Stomach is within normal limits. Appendix is not clearly visualized. No evidence of bowel wall thickening, distention, or inflammatory changes. Sigmoid diverticula. Lymphatic: No enlarged abdominal or pelvic lymph nodes. Reproductive: No mass or other significant abnormality. Other: Broad-based fat and bowel containing left lower quadrant abdominal wall hernia (series 5, image 225). No ascites. Musculoskeletal: No acute osseous findings. IMPRESSION: 1. Unchanged contour and caliber of the thoracic aorta status post stent endograft repair of the arch and descending  thoracic aorta, without evidence of endoleak or other complication on today's examination. 2. Unchanged contour and caliber of the abdominal aorta. Dissection flap, presumably contiguous with the stented lower thoracic aorta arises just below the diaphragmatic hiatus and extends into the right common iliac artery and terminates at the origin of the right external iliac artery. The true aortic lumen supplies all aortic branch vessels, and there is standard branching pattern of the abdominal aorta with solitary bilateral renal arteries. Maximum caliber of the abdominal aorta is 2.5 x 2.2 cm proximally. 3. There is additionally a small focal dissection or penetrating ulceration of the anterior right aspect of the aorta at the level of the diaphragmatic hiatus, measuring 0.7 cm, unchanged. 4. Heterogeneous thyroid goiter, with unchanged large exophytic thyroid nodule extending from the inferior pole of the left lobe of the thyroid well into the middle and posterior mediastinum, deflecting the trachea rightward. 5. Sigmoid diverticula without evidence of acute diverticulitis. 6. Broad-based fat and bowel containing left lower quadrant abdominal wall hernia. Aortic Atherosclerosis (ICD10-I70.0). Electronically Signed   By: Delanna Ahmadi M.D.   On: 01/04/2023 19:15   DG Chest 2 View  Result Date: 01/04/2023 CLINICAL DATA:  Left-sided chest pain and some shortness-of-breath. EXAM: CHEST - 2 VIEW COMPARISON:  08/24/2022 FINDINGS: Lungs are adequately inflated without focal airspace consolidation or effusion. Cardiomediastinal silhouette is unremarkable. Aortic stent graft unchanged. Remainder the exam is unchanged. IMPRESSION: No active cardiopulmonary disease. Electronically Signed   By: Marin Olp M.D.   On: 01/04/2023 15:27     I, Darlene Griffith, personally (independently) visualized and performed the interpretation of the images attached in this note.   Assessment and Plan: 72 y.o. female with bilateral lower  extremity pain.  Symptoms are consistent with neurogenic claudication or perhaps  even claudication.  Left is worse than right.  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  I do not think that is the main source of her symptoms as she does have bilateral symptoms now.  She does have pulses so critical limb ischemia is unlikely.  Additionally she had a relatively normal CT angiogram of the chest abdomen pelvis earlier this week.  Plan for ABI bilateral lower extremities to rule out claudication.  Additionally we will proceed with MRI lumbar spine to the more likely lumbar radiculopathy/neurogenic claudication.  Anticipate lumbar epidural steroid injection following MRI lumbar spine.   PDMP not reviewed this encounter. Orders Placed This Encounter  Procedures   DG Lumbar Spine 2-3 Views    Standing Status:   Future    Number of Occurrences:   1    Standing Expiration Date:   02/08/2023    Order Specific Question:   Reason for Exam (SYMPTOM  OR DIAGNOSIS REQUIRED)    Answer:   low back pain    Order Specific Question:   Preferred imaging location?    Answer:   [facility]   MR Lumbar Spine Wo Contrast    Standing Status:   Future    Standing Expiration Date:   01/08/2024    Order Specific Question:   What is the patient's sedation requirement?    Answer:   No Sedation    Order Specific Question:   Does the patient have a pacemaker or implanted devices?    Answer:   No    Order Specific Question:   Preferred imaging location?    Answer:   [facility] (table limit-550lbs)   No orders of the defined types were placed in this encounter.    Discussed warning signs or symptoms. Please see discharge instructions. Patient expresses understanding.   The above documentation has been reviewed and is accurate and complete Darlene Griffith, M.D.

## 2023-01-08 NOTE — Telephone Encounter (Signed)
     Patient  visit on 01/04/2023  at [facility] was for chest pain.  Have you been able to follow up with your primary care physician? Yes  The patient was or was not able to obtain any needed medicine or equipment. Patient was able to obtain medication.  Are there diet recommendations that you are having difficulty following? No  Patient expresses understanding of discharge instructions and education provided has no other needs at this time. Yes   [place] Resource Care Guide   [email or web address]  ?? [id number]   Website: [email or web address]  [email or web address]

## 2023-01-08 NOTE — Telephone Encounter (Signed)
Pt has appointment today. Received employer accomodation forms to be completed.

## 2023-01-08 NOTE — Patient Instructions (Addendum)
Thank you for coming in today.   Check back in 1 month  You should hear about scheduling the ABI ultrasound soon  Please get an Xray today before you leave   You should hear from MRI scheduling within 1 week. If you do not hear please let me know.

## 2023-01-12 DIAGNOSIS — E0590 Thyrotoxicosis, unspecified without thyrotoxic crisis or storm: Secondary | ICD-10-CM | POA: Diagnosis not present

## 2023-01-12 DIAGNOSIS — E042 Nontoxic multinodular goiter: Secondary | ICD-10-CM | POA: Diagnosis not present

## 2023-01-12 NOTE — Telephone Encounter (Signed)
Form completed, reviewed by Dr. Georgina Snell, and placed at the front desk for scanning and faxing.

## 2023-01-16 DIAGNOSIS — E0590 Thyrotoxicosis, unspecified without thyrotoxic crisis or storm: Secondary | ICD-10-CM | POA: Diagnosis not present

## 2023-01-16 DIAGNOSIS — E042 Nontoxic multinodular goiter: Secondary | ICD-10-CM | POA: Diagnosis not present

## 2023-01-17 ENCOUNTER — Ambulatory Visit
Admission: RE | Admit: 2023-01-17 | Discharge: 2023-01-17 | Disposition: A | Discharge status: Home / Self Care | Payer: BC Managed Care – PPO | Source: Ambulatory Visit | Attending: Family Medicine | Admitting: Family Medicine

## 2023-01-17 DIAGNOSIS — M5116 Intervertebral disc disorders with radiculopathy, lumbar region: Secondary | ICD-10-CM | POA: Diagnosis not present

## 2023-01-17 DIAGNOSIS — M48062 Spinal stenosis, lumbar region with neurogenic claudication: Secondary | ICD-10-CM

## 2023-01-19 NOTE — Progress Notes (Signed)
Lumbar spine x-ray looks normal to radiology.  No fractures are present.

## 2023-01-21 NOTE — Progress Notes (Signed)
You do not have significant pinched nerves in your low back to explain your leg pain.  We may be missing some inflammatory situation in your body.  Recommend return to clinic to go over the results in full detail talk about next steps.

## 2023-01-22 ENCOUNTER — Ambulatory Visit (HOSPITAL_COMMUNITY)
Admission: RE | Admit: 2023-01-22 | Discharge: 2023-01-22 | Disposition: A | Discharge status: Home / Self Care | Payer: BC Managed Care – PPO | Source: Ambulatory Visit | Attending: Family Medicine | Admitting: Family Medicine

## 2023-01-22 DIAGNOSIS — M48062 Spinal stenosis, lumbar region with neurogenic claudication: Secondary | ICD-10-CM | POA: Diagnosis not present

## 2023-01-23 LAB — VAS US ABI WITH/WO TBI
Left ABI: 1.15
Right ABI: 1.28

## 2023-01-23 NOTE — Progress Notes (Signed)
Blood flow test looks normal.

## 2023-01-26 NOTE — Progress Notes (Signed)
Blood flow test confirmed normal.

## 2023-01-30 ENCOUNTER — Ambulatory Visit (INDEPENDENT_AMBULATORY_CARE_PROVIDER_SITE_OTHER): Payer: BC Managed Care – PPO | Admitting: Family Medicine

## 2023-01-30 VITALS — BP 136/70 | HR 80 | Ht 61.0 in | Wt 161.0 lb

## 2023-01-30 DIAGNOSIS — M545 Low back pain, unspecified: Secondary | ICD-10-CM | POA: Diagnosis not present

## 2023-01-30 DIAGNOSIS — M47816 Spondylosis without myelopathy or radiculopathy, lumbar region: Secondary | ICD-10-CM | POA: Diagnosis not present

## 2023-01-30 DIAGNOSIS — G8929 Other chronic pain: Secondary | ICD-10-CM | POA: Diagnosis not present

## 2023-01-30 NOTE — Patient Instructions (Signed)
Thank you for coming in today.   You should hear from MRI scheduling within 1 week. If you do not hear please let me know.   Facet injections.  OK to ask for Dr Mosetta Putt.   We can do more if we need to.   We will continue to work on those forms.

## 2023-01-30 NOTE — Progress Notes (Signed)
Rubin Payor, PhD, LAT, ATC acting as a scribe for [name], MD.  Darlene Griffith is a 72 y.o. female who presents to [facility] today for f/u lumbar spinal stenosis w/ bilat leg pain, L>R, w/ L-spine MRI and ABI review. Injured occurred following a transcatheter aortic valve replacement and bypass grafting.  During the procedure, she had loss of BP and had chest compressions.  Pt was last seen by Dr. Denyse Amass on 01/08/23 and was advised to proceed to L-spine MRI and ABI. Today, pt reports her low back is very painful this morning. Pt is interested in some type of repeat back injections. Pt is having work accommodations due to an incontinence issue.  She feels urinary urgency and has she get up to go to the bathroom multiple times a day.  She does not have a lot of warning.  Dx imaging: 01/22/23 Vasc US ABI 01/17/23 L-spine MRI  01/08/23 L-spine XR 12/23/22 L tib/fib MRI 12/04/22 L tib/fib XR 12/21/21 L-spine & T-spine MRI 05/28/18 L hip/pelvis XR  Pertinent review of systems: No fevers or chills  Relevant historical information: History of aortic dissection.   Exam:  BP 136/70   Pulse 80   Ht  (1.549 m)   Wt 161 lb (73 kg)   SpO2 96%   BMI 30.42 kg/m  General: Well Developed, well nourished, and in no acute distress.   MSK: L-spine decreased lumbar motion.    Lab and Radiology Results  EXAM: MRI LUMBAR SPINE WITHOUT CONTRAST   TECHNIQUE: Multiplanar, multisequence MR imaging of the lumbar spine was performed. No intravenous contrast was administered.   COMPARISON:  Radiographs 01/08/2023 and MRI 12/21/2021.   FINDINGS: Segmentation: Conventional anatomy assumed, with the last open disc space designated L5-S1.Concordant with prior imaging.   Alignment:  Physiologic.   Vertebrae: No worrisome osseous lesion, acute fracture or pars defect. Stable scattered hemangiomas or fatty deposits.   Conus medullaris: Extends to the L1-2 level  and appears normal.   Paraspinal and other soft tissues: No significant paraspinal findings. Grossly stable appearance of known chronic aortic dissection evaluated by CT 01/04/2023.   Disc levels:   Minimal disc bulging at L1-2 and L2-3. No spinal stenosis or nerve root encroachment.   L3-4: Mild disc bulging eccentric to the left with mild facet hypertrophy. Mild inferior foraminal narrowing on the left without definite L3 nerve root encroachment. The spinal canal and lateral recesses are patent.   L4-5: Preserved disc height with minimal disc bulging. Mildly progressive bilateral facet hypertrophy. No significant spinal stenosis or nerve root encroachment.   L5-S1: As numbered, this is a transitional disc space level without acquired abnormality. No spinal stenosis or nerve root encroachment.   IMPRESSION: 1. No acute findings or explanation for the patient's symptoms. 2. Mild disc bulging and facet hypertrophy as described without resulting significant spinal stenosis or nerve root encroachment.     Electronically Signed   By: Carey Bullocks M.D.   On: 01/20/2023 11:10   ABI dated January 22, 2023 Summary:  Right: Resting right ankle-brachial index is within normal range. The  right toe-brachial index is normal.   Left: Resting left ankle-brachial index is within normal range. The left  toe-brachial index is normal.    *See table(s) above for measurements and observations.      Electronically signed by Lorine Bears MD on 01/23/2023 at 1:03:41 PM.       Assessment and Plan: 72 y.o. female  with  Chronic low back pain.  Previously she had some benefit with interlaminar epidural steroid injection at T11-12.  This was repeated in late February and is already wearing off.  She wonders if her low back could be source of pain.  She does have facet arthritis bilaterally L4-L5.  She has had facet injections in the past most recently June 2023 but repeat injection in the  near future could be helpful.  Will go ahead and order that injection now.  Clarified her leave the situation.  Plan to call the case manager and discuss her situation and try to figure out how we can better explain her needs.  It seems pretty obvious to me based on the information we have provided that she would benefit from an accommodation at work.  I am not sure what the problem is. It is possible we just do not give enough information.  Will seek to understand the problem and correct it.    PDMP not reviewed this encounter. Orders Placed This Encounter  Procedures   DG FACET JT INJ L /S SINGLE LEVEL RIGHT W/FL/CT    Standing Status:   Future    Standing Expiration Date:   01/30/2024    Order Specific Question:   Reason for Exam (SYMPTOM  OR DIAGNOSIS REQUIRED)    Answer:   BL L4-5 facet    Order Specific Question:   Preferred Imaging Location?    Answer:   [facility]    Order Specific Question:   Radiology Contrast Protocol - do NOT remove file path    Answer:   [email or web address]   DG FACET JT INJ L /S SINGLE LEVEL LEFT W/FL/CT    Standing Status:   Future    Standing Expiration Date:   01/30/2024    Order Specific Question:   Reason for Exam (SYMPTOM  OR DIAGNOSIS REQUIRED)    Answer:   BL L4-5 facet    Order Specific Question:   Preferred Imaging Location?    Answer:   [facility]    Order Specific Question:   Radiology Contrast Protocol - do NOT remove file path    Answer:   [email or web address]   No orders of the defined types were placed in this encounter.    Discussed warning signs or symptoms. Please see discharge instructions. Patient expresses understanding.   The above documentation has been reviewed and is accurate and complete [name], M.D.

## 2023-02-03 NOTE — Telephone Encounter (Signed)
At visit DOS 01/30/23, patient advised of denied workplace accomodation request. She agreed to e-mail denial letter, then proceed from there.   Called HR and spoke with Marquita, need to provide additional information for questions 2 and 6.   Form updated and placed at the front desk for faxing.

## 2023-02-19 ENCOUNTER — Inpatient Hospital Stay: Admission: RE | Admit: 2023-02-19 | Payer: BC Managed Care – PPO | Source: Ambulatory Visit

## 2023-02-19 ENCOUNTER — Other Ambulatory Visit: Payer: BC Managed Care – PPO

## 2023-02-25 ENCOUNTER — Telehealth: Payer: Self-pay | Admitting: Family Medicine

## 2023-02-25 DIAGNOSIS — G8929 Other chronic pain: Secondary | ICD-10-CM

## 2023-02-25 NOTE — Telephone Encounter (Signed)
Pt called. She has appt with [facility] for Friday and is desperate to have something done for her back. She is aware of denial.  Pt states that Darlene Griffith at [facility] advised her if we were to change the location from lumbar to mid back, we would have a better chance of getting approval. Pt states entire back hurts.

## 2023-02-25 NOTE — Telephone Encounter (Signed)
Forwarding to Dr. Denyse Amass to review order and advise.

## 2023-02-25 NOTE — Telephone Encounter (Signed)
Cathy from [facility] called stating that the epidural ordered has been denied by her insurance due to not meeting criteria needed.

## 2023-02-26 ENCOUNTER — Other Ambulatory Visit: Payer: Self-pay

## 2023-02-26 ENCOUNTER — Ambulatory Visit: Payer: BC Managed Care – PPO | Attending: Surgery | Admitting: Physical Therapy

## 2023-02-26 ENCOUNTER — Encounter: Payer: Self-pay | Admitting: Physical Therapy

## 2023-02-26 DIAGNOSIS — R279 Unspecified lack of coordination: Secondary | ICD-10-CM | POA: Diagnosis not present

## 2023-02-26 DIAGNOSIS — R293 Abnormal posture: Secondary | ICD-10-CM

## 2023-02-26 DIAGNOSIS — R269 Unspecified abnormalities of gait and mobility: Secondary | ICD-10-CM | POA: Insufficient documentation

## 2023-02-26 DIAGNOSIS — M6281 Muscle weakness (generalized): Secondary | ICD-10-CM | POA: Insufficient documentation

## 2023-02-26 DIAGNOSIS — M62838 Other muscle spasm: Secondary | ICD-10-CM

## 2023-02-26 NOTE — Patient Instructions (Signed)

## 2023-02-26 NOTE — Therapy (Signed)
OUTPATIENT PHYSICAL THERAPY FEMALE PELVIC EVALUATION   Patient Name: Darlene Griffith MRN: [id number] DOB:Jul 12, 1951, 72 y.o., female Today's Date: 02/26/2023  END OF SESSION:  PT End of Session - 02/26/23 1445     Visit Number 1    Date for PT Re-Evaluation 05/29/23    Authorization Type [employer]    PT Start Time 1445    PT Stop Time 1525    PT Time Calculation (min) 40 min    Activity Tolerance Patient tolerated treatment well    Behavior During Therapy WFL for tasks assessed/performed             Past Medical History:  Diagnosis Date   Chest pain    a. 01/2015 Echo: Nl LV fxn, Gr 1 DD, triv AI, mild MR.   Essential hypertension    Hepatic cyst    a. noted on CT 01/2015.   Hyperthyroidism    Multinodular goiter    a. 01/2015 CT chest: multinodular goidter w/ substernal extension of the left lobe of the thyroid assoc w/ rightward deviation of tracheal air column.   Neck pain, chronic    Personal history of colonic polyps    Pulmonary nodules    a. 01/2015 CT Chest: RLL ~ 5mm subpleural nodule - rec f/u in 6-12 mos.   Splenic cyst    a. noted on CT 01/2015.   Past Surgical History:  Procedure Laterality Date   BYPASS GRAFT AORTA TO AORTA Left 11/11/2021   Procedure: BYPASS GRAFT AORTA TO LEFT COMMON  FEMORAL ARTERY;  Surgeon: Leonie Douglas, MD;  Location: [facility] OR;  Service: Vascular;  Laterality: Left;   CERVICAL DISC ARTHROPLASTY N/A 07/05/2019   Procedure: Cervical Six-Seven Artificial disc replacement;  Surgeon: Barnett Abu, MD;  Location: [facility] OR;  Service: Neurosurgery;  Laterality: N/A;  Cervical Six-Seven Artificial disc replacement   CESAREAN SECTION     COLONOSCOPY  01/13/2020   THORACIC AORTIC ENDOVASCULAR STENT GRAFT N/A 11/11/2021   Procedure: THORACIC AORTIC ENDOVASCULAR STENT GRAFT WITH LEFT BRACHIAL  ARTERY ACCESS;  Surgeon: Leonie Douglas, MD;  Location: [facility] OR;  Service: Vascular;  Laterality: N/A;   TONSILLECTOMY     AROUND 5-6 YRS OLD   ULTRASOUND GUIDANCE  FOR VASCULAR ACCESS Bilateral 11/11/2021   Procedure: ULTRASOUND GUIDANCE FOR VASCULAR ACCESS;  Surgeon: Leonie Douglas, MD;  Location: [facility] OR;  Service: Vascular;  Laterality: Bilateral;   UPPER GASTROINTESTINAL ENDOSCOPY  01/13/2020   Patient Active Problem List   Diagnosis Date Noted   Seasonal allergic rhinitis 12/19/2022   Urge incontinence of urine 12/19/2022   Incisional hernia, without obstruction or gangrene 09/26/2022   COPD (chronic obstructive pulmonary disease) (HCC) 11/08/2021   AKI (acute kidney injury) (HCC)    Aortic dissection (HCC) 11/06/2021   Gastroesophageal reflux disease without esophagitis    Hyperthyroidism 04/19/2021   Snoring 03/02/2020   Tobacco use 03/02/2020   Genetic testing 02/10/2020   Family history of lung cancer    Personal history of colonic polyps    Dysphagia 01/06/2020   Cervical spondylosis with myelopathy and radiculopathy 07/05/2019   Neck pain, chronic 05/21/2019   Insomnia 07/27/2018   Multinodular goiter 07/14/2018   Chronic fatigue 06/15/2018   Midsternal chest pain 02/18/2015   Chest pain    Cough    Essential hypertension    Shortness of breath    Aortic stenosis    Pain in the chest    EKG abnormality    Malaise and fatigue  Lung nodule    Splenic cyst    Hepatic cyst    Hyperlipidemia    Hypertension 02/16/2015    PCP: [place], Betty G, MD  REFERRING PROVIDER: Stechschulte, Hyman Hopes, MD   REFERRING DIAG: [phone] (ICD-10-CM) - Incisional hernia, without obstruction or gangrene  THERAPY DIAG:  Muscle weakness (generalized) - Plan: PT plan of care cert/re-cert  Abnormal posture - Plan: PT plan of care cert/re-cert  Unspecified lack of coordination - Plan: PT plan of care cert/re-cert  Abnormality of gait and mobility - Plan: PT plan of care cert/re-cert  Other muscle spasm - Plan: PT plan of care cert/re-cert  Rationale for Evaluation and Treatment: Rehabilitation  ONSET DATE: one year - since leaving hospital  last year  SUBJECTIVE:                                                                                                                                                                                           SUBJECTIVE STATEMENT: Active hernia at lt side of abdomen, large in size and pt reports this also leads to back pain.  Can only stand for 2-3 mins   Water- not a lot but I do like sodas  PAIN:  Are you having pain? Yes NPRS scale: 10-7/10 Pain location:  low back, low abdomen  Pain type: burning and sharp Pain description: constant   Aggravating factors: standing, sitting, prolonged walking Relieving factors: rest but not too long  PRECAUTIONS: Fall  WEIGHT BEARING RESTRICTIONS: No  FALLS:  Has patient fallen in last 6 months? No  LIVING ENVIRONMENT: Lives with: lives alone   OCCUPATION: healthcare medications shipping  PLOF: Independent  PATIENT GOALS: to have less pain  PERTINENT HISTORY:    HTN, aortic bypass, c-section, chronic fatigue, SOB, Aortic stenosis,takes nitroglycerin for chest pain,  CHF Complications from surgery last year with iliac artery rupture requiring transverse incision in Lt lower quadrant for retroperitoneal exposure of the L iliac vessel for L iliac to common femoral bypass. Developed hernia, consulted for hernia repair, but per pt MD recommended no surgery.   Sexual abuse: No  BOWEL MOVEMENT: Pain with bowel movement: No Type of bowel movement:Type (Bristol Stool Scale) 2-6, Frequency after all meals (within 10 mins), and Strain No Fully empty rectum: Yes:   Leakage: No Pads: No Fiber supplement: No  URINATION: Pain with urination: No Fully empty bladder: Yes:   Stream: Strong and Weak Urgency: Yes:   Frequency: increased every 30-45 mins, 3-4x night Leakage: Urge to void Pads: Yes: most of the time  INTERCOURSE: Pain with intercourse:  not painful  Ability to have vaginal penetration:  Yes:   Climax: not  painful Marinoff Scale: 0/3  PREGNANCY: Vaginal deliveries 3 Tearing Yes: full 4th grade tearing with breech baby C-section deliveries 1 Currently pregnant No  PROLAPSE: None   OBJECTIVE:   DIAGNOSTIC FINDINGS:  f/u lumbar spinal stenosis w/ bilat leg pain, L>R, w/ L-spine MRI  transcatheter aortic valve replacement and bypass grafting  urinary urgency and has she get up to go to the bathroom multiple times a day.  CT 01/04/2023. IMPRESSION: 1. No acute findings or explanation for the patient's symptoms. 2. Mild disc bulging and facet hypertrophy as described without resulting significant spinal stenosis or nerve root encroachment.  COGNITION: Overall cognitive status: Within functional limits for tasks assessed     SENSATION: Light touch: Appears intact Proprioception: Appears intact  MUSCLE LENGTH: Bil hamstrings and adductors limited by 50%  LUMBAR SPECIAL TESTS:  Single leg stance test: 2s Rt 1s lt , SI Compression/distraction test: pain with both, and FABER test: pain bil  TUG:31s with rolling walker  GAIT: Distance walked: 150' Assistive device utilized: Environmental consultant - 4 wheeled Level of assistance: Modified independence Comments: decreased cadence, very forward trunk, decreased stride length, decreased step height bil  POSTURE: rounded shoulders, forward head, anterior pelvic tilt, and flexed trunk    LUMBARAROM/PROM:  A/PROM A/PROM  eval  Flexion Limited  by 75%  Extension Limited  by 50%  Right lateral flexion Limited  by 50%  Left lateral flexion Limited  by 50%  Right rotation Limited  by 75%  Left rotation Limited  by 75%   (Blank rows = not tested)  LOWER EXTREMITY ROM:  WFL LOWER EXTREMITY MMT:  MMT Right eval Left eval  Hip flexion 4 4  Hip extension 3+ 3+  Hip abduction 3 3  Hip adduction 3+ 3+  Hip internal rotation    Hip external rotation    Knee flexion 4 4  Knee extension 4 4  Ankle dorsiflexion    Ankle plantarflexion     Ankle inversion    Ankle eversion     PALPATION:   General  TTP throughout abdomen, tight thoracic and lumbar spine, TTP from T4-L5 worse at mid thoracic range, TTP at PSIS bil                External Perineal Exam deferred today                             Internal Pelvic Floor deferred today  Patient confirms identification and approves PT to assess internal pelvic floor and treatment No However agreeable at next appt PELVIC MMT:   MMT eval  Vaginal   Internal Anal Sphincter   External Anal Sphincter   Puborectalis   Diastasis Recti   (Blank rows = not tested)        TONE: Deferred   PROLAPSE: deferred  TODAY'S TREATMENT:  DATE:   02/26/23 EVAL Examination completed, findings reviewed, pt educated on POC, urge drill. Pt motivated to participate in PT and agreeable to attempt recommendations.     PATIENT EDUCATION:  Education details: urge drill Person educated: Patient Education method: Explanation, Demonstration, Tactile cues, Verbal cues, and Handouts Education comprehension: verbalized understanding and returned demonstration  HOME EXERCISE PROGRAM: To be given  ASSESSMENT:  CLINICAL IMPRESSION: Patient is a 72 y.o. female  who was seen today for physical therapy evaluation and treatment for large Lt abdominal hernia. Pt has complicated medical history and reports she is not a surgical candidate for hernia repair. Pt has chronic constant pain in back and abdomen, reports the hernia size creates increase urinary frequency and urgency, sometimes has small leakage and wears pads daily. Pt states she is fatigued all the time and wants to be more active and stronger but limited with pain and hernia. Pt very motivated to participate. Pt found to have decreased flexibility/mobility in spine in all directions, decreased hip flexibility, decreased hip  and core strength. Pt agreeable to internal at next appointment with pelvic floor therapist. Pt tolerated session well and Pt would benefit from additional PT to further address deficits.    OBJECTIVE IMPAIRMENTS: decreased activity tolerance, decreased coordination, decreased endurance, decreased mobility, difficulty walking, decreased strength, increased fascial restrictions, increased muscle spasms, impaired flexibility, improper body mechanics, postural dysfunction, and pain.   ACTIVITY LIMITATIONS: carrying, lifting, bending, sitting, standing, squatting, transfers, and locomotion level  PARTICIPATION LIMITATIONS: driving, shopping, community activity, and yard work  PERSONAL FACTORS: Time since onset of injury/illness/exacerbation and 1 comorbidity: medical history  are also affecting patient's functional outcome.   REHAB POTENTIAL: Good  CLINICAL DECISION MAKING: Stable/uncomplicated  EVALUATION COMPLEXITY: Low   GOALS: Goals reviewed with patient? Yes  SHORT TERM GOALS: Target date: 03/26/23  Pt to be I with HEP.  Baseline: Goal status: INITIAL  2.  Pt to demonstrate at least 3/5 pelvic floor strength for improved pelvic stability and decreased strain at pelvic floor/ decrease leakage.  Baseline:  Goal status: INITIAL  3.  Pt will have 25% less urgency due to bladder retraining and strengthening  Baseline:  Goal status: INITIAL  4.  Pt to demonstrate at least 4/5 bil hip strength for improved pelvic stability and functional squats without leakage.  Baseline:  Goal status: INITIAL   LONG TERM GOALS: Target date: 05/29/23  Pt to be I with advanced HEP.  Baseline:  Goal status: INITIAL  2.  Pt to demonstrate at least 5/5 bil hip strength for improved pelvic stability and functional squats without leakage.  Baseline:  Goal status: INITIAL  3.  Pt will have 50% less urgency due to bladder retraining and strengthening  Baseline:  Goal status: INITIAL  4.  Pt to  demonstrate improved gait mechanics with improved cadence and posture for at least 250' with LRAD at mod I and min cues for improved mobility at home and for community activity.  Baseline:  Goal status: INITIAL  5.  Pt to complete x10 squats with at least 15# for improved mobility and strength at hips for improved gait and transfers for decreased fall risk.  Baseline:  Goal status: INITIAL  6. PT to demonstrate improved TUG test to no more than 20s with rolling walker for decreased fall risk Baseline:  Goal status: INITIAL  PLAN:  PT FREQUENCY: 1-2x/week  PT DURATION:  12 sessions  PLANNED INTERVENTIONS: Therapeutic exercises, Therapeutic activity, Neuromuscular re-education, Balance training, Gait training, Patient/Family  education, Self Care, Joint mobilization, DME instructions, Aquatic Therapy, Dry Needling, Spinal mobilization, Cryotherapy, Moist heat, scar mobilization, Taping, Biofeedback, and Manual therapy  PLAN FOR NEXT SESSION: hip, core, pelvic floor strengthening, activity tolerance, mobility at spine and hips  Otelia Sergeant, PT, DPT 05/09/244:05 PM

## 2023-02-27 ENCOUNTER — Other Ambulatory Visit: Payer: BC Managed Care – PPO

## 2023-02-27 NOTE — Telephone Encounter (Signed)
Left message letting patient know °

## 2023-02-27 NOTE — Telephone Encounter (Signed)
Okay to change the order 

## 2023-02-27 NOTE — Telephone Encounter (Signed)
Order updated and sent to [facility] via [employer]. Please reach out to pt to advise.

## 2023-03-04 ENCOUNTER — Telehealth: Payer: Self-pay | Admitting: Family Medicine

## 2023-03-04 NOTE — Telephone Encounter (Signed)
Contacted Darlene Griffith to schedule their annual wellness visit. Appointment made for 03/12/23.  Darlene Griffith [facility] direct phone # [phone]

## 2023-03-05 ENCOUNTER — Other Ambulatory Visit: Payer: Self-pay | Admitting: Family Medicine

## 2023-03-05 NOTE — Telephone Encounter (Signed)
Darlene Griffith,  Dr. Denyse Amass would like to set up a peer-to-peer review if possible.

## 2023-03-05 NOTE — Telephone Encounter (Signed)
It looks like a repeat T11-T12 facet injection. She received 2 within the past year and did well with them.

## 2023-03-05 NOTE — Telephone Encounter (Signed)
Darlene Griffith from [facility] called stating that the new order for the thoracic does not match up with any of his notes so she does not think it would be approved. I advised her of what the patient told us about changing the order but she did not know why she would be told that.  Please advise.

## 2023-03-05 NOTE — Telephone Encounter (Signed)
I sent a message to cathy in regard to this. It does not seem like she even started this case. I let her know to initiate it if she hasn't and Dr. Denyse Amass will do a peer to peer if it is denied. And if it is denied for her to fax the denial letter and we will take over from there.

## 2023-03-06 NOTE — Telephone Encounter (Signed)
I am going to see if I can run it on my end or call the insurance, because Lynden Ang from [employer] is stating she is not even getting a denial it just says insufficient clinicals that's it. No letter no number nothing helpful.

## 2023-03-06 NOTE — Telephone Encounter (Signed)
Noted  

## 2023-03-11 NOTE — Telephone Encounter (Signed)
Denial reason Clinical Rationale: Your doctor told us that you have lower back pain. Your doctor wants to give you an injection in between the bones of your lower back. This injection is needed when certain criteria are met. You can have this injection if you have joint pain from inflammation in your body. You can have this injection if you should not have the procedure that heats nerve tissue. You can have this injection if you have an implanted electrical device. You can have this injection if you have pain going down your leg that is due to a cyst pressing on a nerve from your spine. You should have pain that has stopped you from doing some of your normal activities for at least three months. A physical exam by your doctor should show signs that the pain is coming from the small joints of your lower back. You should have pain that has not improved after completing six weeks of treatment. You need at least two types of treatment. This could include medications as well as other treatments but must include special exercises that help make muscles stronger. This is called physical therapy. We reviewed the notes we have. The notes do not show that you meet all the required criteria. As a result, this injection is not medically necessary.

## 2023-03-11 NOTE — Telephone Encounter (Signed)
I have added more clinicals to the portal to see if we can get it approved.

## 2023-03-12 ENCOUNTER — Ambulatory Visit (INDEPENDENT_AMBULATORY_CARE_PROVIDER_SITE_OTHER): Payer: BC Managed Care – PPO | Admitting: Family Medicine

## 2023-03-12 DIAGNOSIS — Z1231 Encounter for screening mammogram for malignant neoplasm of breast: Secondary | ICD-10-CM | POA: Diagnosis not present

## 2023-03-12 DIAGNOSIS — E2839 Other primary ovarian failure: Secondary | ICD-10-CM | POA: Diagnosis not present

## 2023-03-12 DIAGNOSIS — Z0000 Encounter for general adult medical examination without abnormal findings: Secondary | ICD-10-CM | POA: Diagnosis not present

## 2023-03-12 NOTE — Progress Notes (Signed)
PATIENT CHECK-IN and HEALTH RISK ASSESSMENT QUESTIONNAIRE:  -completed by phone/video for upcoming Medicare Preventive Visit  Pre-Visit Check-in: 1)Vitals (height, wt, BP, etc) - record in vitals section for visit on day of visit 2)Review and Update Medications, Allergies PMH, Surgeries, Social history in Epic 3)Hospitalizations in the last year with date/reason? 11/06/2021 for tear in heart.   4)Review and Update Care Team (patient's specialists) in Epic 5) Complete PHQ9 in Epic  6) Complete Fall Screening in Epic 7)Review all Health Maintenance Due and order under PCP if not done.  8)Medicare Wellness Questionnaire: Answer theses question about your habits: Do you drink alcohol? No  Have you ever smoked?Yes Quit date if applicable? Unable to recall the year, 3-4 years ago  How many packs a day do/did you smoke? A pk a week.  Do you use smokeless tobacco?No Do you use an illicit drugs?No Do you exercises? No - but is doing some hands weights.  Are you sexually active? No Reports eating habits are poor - daughter cooks her balanced meals  Typical breakfast: dont always eat breakfast when do: bacon, egg, toast Typical lunch: seldom: sandwich- [place] Typical dinner:seldom: something light. No dinner last night. Night before hot dog Typical snacks:don't really snack. Raisin.   Beverages: cranberry, water, coffee, pepsi  Answer theses question about you: Can you perform most household chores?no Do you find it hard to follow a conversation in a noisy room?no Do you often ask people to speak up or repeat themselves?yes Do you feel that you have a problem with memory?yes bought something to help with memory. Do you balance your checkbook and or bank acounts? No with assistant from son.  Do you feel safe at home?yes Last dentist visit?no teeth, need dentures. Haven't seen dentist for a long time.  Do you need assistance with any of the following: Please note if so   Driving? Yes    Feeding yourself? no   Getting from bed to chair? no  Getting to the toilet? no  Bathing or showering? no  Dressing yourself? no  Managing money? Yes with son  Climbing a flight of stairs yes  Preparing meals? With limitation due to back pain.   Do you have Advanced Directives in place (Living Will, Healthcare Power or Attorney)? No, need to do it.    Last eye Exam and location?"Its been forever" eye sight is getting really bad - she report daughter is scheduling her an eye visit   Do you currently use prescribed or non-prescribed narcotic or opioid pain medications? Tylenol  Do you have a history or close family history of breast, ovarian, tubal or peritoneal cancer or a family member with BRCA (breast cancer susceptibility 1 and 2) gene mutations? No  Nurse/Assistant Credentials/time stamp: [place] m/CMA/   ----------------------------------------------------------------------------------------------------------------------------------------------------------------------------------------------------------------------   MEDICARE ANNUAL PREVENTIVE VISIT WITH PROVIDER: (Welcome to [employer], initial annual wellness or annual wellness exam)  Virtual Visit via Phone Note  I connected with Darlene Griffith on 03/12/23 by phone and verified that I am speaking with the correct person using two identifiers.  Location patient: home Location provider:work or home office Persons participating in the virtual visit: patient, provider  Concerns and/or follow up today: reports is doing ok. Dealing with back issues - seeing specialist - starting PT. Reports almost died this year in the hospital last yea and is just happy to be alive. Reports has been told is a miracle she is alive.    See HM section in Epic for other details of  completed HM.    ROS: negative for report of fevers, unintentional weight loss, vision changes, vision loss, hearing loss or change, chest pain, sob, hemoptysis,  melena, hematochezia, hematuria, falls, bleeding or bruising, thoughts of suicide or self harm, memory loss  Patient-completed extensive health risk assessment - reviewed and discussed with the patient: See Health Risk Assessment completed with patient prior to the visit either above or in recent phone note. This was reviewed in detailed with the patient today and appropriate recommendations, orders and referrals were placed as needed per Summary below and patient instructions.   Review of Medical History: -PMH, PSH, Family History and current specialty and care providers reviewed and updated and listed below   Patient Care Team: [place], Betty G, MD as PCP - General (Family Medicine)   Past Medical History:  Diagnosis Date   Chest pain    a. 01/2015 Echo: Nl LV fxn, Gr 1 DD, triv AI, mild MR.   Essential hypertension    Hepatic cyst    a. noted on CT 01/2015.   Hyperthyroidism    Multinodular goiter    a. 01/2015 CT chest: multinodular goidter w/ substernal extension of the left lobe of the thyroid assoc w/ rightward deviation of tracheal air column.   Neck pain, chronic    Personal history of colonic polyps    Pulmonary nodules    a. 01/2015 CT Chest: RLL ~ 5mm subpleural nodule - rec f/u in 6-12 mos.   Splenic cyst    a. noted on CT 01/2015.    Past Surgical History:  Procedure Laterality Date   BYPASS GRAFT AORTA TO AORTA Left 11/11/2021   Procedure: BYPASS GRAFT AORTA TO LEFT COMMON  FEMORAL ARTERY;  Surgeon: Leonie Douglas, MD;  Location: [facility] OR;  Service: Vascular;  Laterality: Left;   CERVICAL DISC ARTHROPLASTY N/A 07/05/2019   Procedure: Cervical Six-Seven Artificial disc replacement;  Surgeon: Barnett Abu, MD;  Location: [facility] OR;  Service: Neurosurgery;  Laterality: N/A;  Cervical Six-Seven Artificial disc replacement   CESAREAN SECTION     COLONOSCOPY  01/13/2020   THORACIC AORTIC ENDOVASCULAR STENT GRAFT N/A 11/11/2021   Procedure: THORACIC AORTIC ENDOVASCULAR STENT GRAFT  WITH LEFT BRACHIAL  ARTERY ACCESS;  Surgeon: Leonie Douglas, MD;  Location: [facility] OR;  Service: Vascular;  Laterality: N/A;   TONSILLECTOMY     AROUND 5-6 YRS OLD   ULTRASOUND GUIDANCE FOR VASCULAR ACCESS Bilateral 11/11/2021   Procedure: ULTRASOUND GUIDANCE FOR VASCULAR ACCESS;  Surgeon: Leonie Douglas, MD;  Location: [facility] OR;  Service: Vascular;  Laterality: Bilateral;   UPPER GASTROINTESTINAL ENDOSCOPY  01/13/2020    Social History   Socioeconomic History   Marital status: Single    Spouse name: Not on file   Number of children: Not on file   Years of education: Not on file   Highest education level: Not on file  Occupational History   Occupation: retired  Tobacco Use   Smoking status: Light Smoker    Types: Cigarettes   Smokeless tobacco: Never   Tobacco comments:    1-2 NOT EVERY DAY   Vaping Use   Vaping Use: Never used  Substance and Sexual Activity   Alcohol use: Yes    Alcohol/week: 0.0 standard drinks of alcohol    Comment: rare   Drug use: No   Sexual activity: Not Currently  Other Topics Concern   Not on file  Social History Narrative   Lives alone.  Four children and eight grands.  Social Determinants of Health   Financial Resource Strain: Low Risk  (12/19/2021)   Overall Financial Resource Strain (CARDIA)    Difficulty of Paying Living Expenses: Not hard at all  Food Insecurity: No Food Insecurity (03/12/2023)   Hunger Vital Sign    Worried About Running Out of Food in the Last Year: Never true    Ran Out of Food in the Last Year: Never true  Transportation Needs: Unmet Transportation Needs (03/12/2023)   PRAPARE - Transportation    Lack of Transportation (Medical): Yes    Lack of Transportation (Non-Medical): Yes  Physical Activity: Inactive (03/12/2023)   Exercise Vital Sign    Days of Exercise per Week: 0 days    Minutes of Exercise per Session: 0 min  Stress: Stress Concern Present (03/12/2023)   [employer] of Occupational Health -  Occupational Stress Questionnaire    Feeling of Stress : To some extent  Social Connections: Moderately Integrated (12/19/2021)   Social Connection and Isolation Panel [NHANES]    Frequency of Communication with Friends and Family: More than three times a week    Frequency of Social Gatherings with Friends and Family: More than three times a week    Attends Religious Services: More than 4 times per year    Active Member of [employer] or Organizations: Yes    Attends Banker Meetings: 1 to 4 times per year    Marital Status: Never married  Intimate Partner Violence: Not At Risk (12/19/2021)   Humiliation, Afraid, Rape, and Kick questionnaire    Fear of Current or Ex-Partner: No    Emotionally Abused: No    Physically Abused: No    Sexually Abused: No    Family History  Problem Relation Age of Onset   Heart attack Maternal Grandmother        deceased   High blood pressure Mother    High Cholesterol Mother    Thyroid disease Mother    Thyroid disease Sister    Lung cancer Father    Cancer Maternal Uncle        unk type   Cancer Sister        unk type   Colon cancer Neg Hx    Colon polyps Neg Hx    Esophageal cancer Neg Hx    Rectal cancer Neg Hx    Stomach cancer Neg Hx     Current Outpatient Medications on File Prior to Visit  Medication Sig Dispense Refill   amLODipine (NORVASC) 10 MG tablet Take 1 tablet by mouth once daily 90 tablet 2   losartan (COZAAR) 25 MG tablet Take 1 tablet (25 mg total) by mouth daily. 90 tablet 1   acetaminophen (TYLENOL) 325 MG tablet Take 1-2 tablets (325-650 mg total) by mouth every 4 (four) hours as needed for mild pain.     ascorbic acid (VITAMIN C) 1000 MG tablet Take 1 tablet (1,000 mg total) by mouth daily. 30 tablet 0   atorvastatin (LIPITOR) 40 MG tablet Take 1 tablet (40 mg total) by mouth daily. 90 tablet 2   carvedilol (COREG) 25 MG tablet Take 1 tablet (25 mg total) by mouth 2 (two) times daily with a meal. 180 tablet 2    clotrimazole-betamethasone (LOTRISONE) cream Apply to affected area 2 times daily prn 15 g 0   diclofenac Sodium (VOLTAREN) 1 % GEL Apply 4 g topically 4 (four) times daily. 100 g 0   DULoxetine (CYMBALTA) 30 MG capsule Take 1 capsule (30 mg  total) by mouth at bedtime. 30 capsule 1   fluticasone (FLONASE) 50 MCG/ACT nasal spray Place 2 sprays into both nostrils at bedtime as needed for allergies or rhinitis. 16 g 1   Ipratropium-Albuterol (COMBIVENT RESPIMAT) 20-100 MCG/ACT AERS respimat Inhale 1 puff into the lungs in the morning, at noon, and at bedtime. 4 g 4   methimazole (TAPAZOLE) 5 MG tablet Take 0.5 tablets (2.5 mg total) by mouth 3 (three) times daily. Keep appt with endocrinologist 11/2022 90 tablet 3   mirabegron ER (MYRBETRIQ) 25 MG TB24 tablet Take 1 tablet (25 mg total) by mouth daily. 90 tablet 1   nitroGLYCERIN (NITROSTAT) 0.4 MG SL tablet Place 1 tablet (0.4 mg total) under the tongue every 5 (five) minutes as needed for chest pain. 30 tablet 0   pantoprazole (PROTONIX) 40 MG tablet Take 1 tablet (40 mg total) by mouth at bedtime. 90 tablet 2   polyethylene glycol (MIRALAX / GLYCOLAX) 17 g packet Take 17 g by mouth daily as needed for mild constipation. 14 each 0   prochlorperazine (COMPAZINE) 5 MG tablet Take 1-2 tablets (5-10 mg total) by mouth every 6 (six) hours as needed for nausea. 30 tablet 0   senna (SENOKOT) 8.6 MG TABS tablet Take 2 tablets (17.2 mg total) by mouth at bedtime. 60 tablet 0   tiZANidine (ZANAFLEX) 4 MG tablet Take 1 tablet (4 mg total) by mouth every 6 (six) hours as needed for muscle spasms. 30 tablet 1   traZODone (DESYREL) 50 MG tablet Take 0.5-1 tablets (25-50 mg total) by mouth at bedtime as needed for sleep. 20 tablet 0   No current facility-administered medications on file prior to visit.    Allergies  Allergen Reactions   Shellfish Allergy Anaphylaxis   Gabapentin     Other reaction(s): Confusion (intolerance)   Hm Lidocaine Patch [Lidocaine]  Other (See Comments)    Lidocaine patches caused burning   Tape Other (See Comments)    Pulls skin       Physical Exam There were no vitals filed for this visit. Estimated body mass index is 30.42 kg/m as calculated from the following:   Height as of 01/30/23: 5\' 1"  (1.549 m).   Weight as of 01/30/23: 161 lb (73 kg).  EKG (optional): deferred due to virtual visit  GENERAL: alert, oriented, no acute distress detected, full vision exam deferred due to pandemic and/or virtual encounter PSYCH/NEURO: pleasant and cooperative, no obvious depression or anxiety, speech and thought processing grossly intact, Cognitive function grossly intact  Flowsheet Row Office Visit from [facility]/12/2023 in [facility]  PHQ-9 Total Score 15           03/12/2023    2:57 PM 12/19/2022   10:28 AM 09/26/2022    7:06 AM 12/19/2021    3:51 PM 12/13/2021   10:09 AM  Depression screen PHQ 2/9  Decreased Interest 3 1 3  0 3  Down, Depressed, Hopeless 0 0 1 0 3  PHQ - 2 Score 3 1 4  0 6  Altered sleeping 3 3 3  0 3  Tired, decreased energy 3 3 3  0 3  Change in appetite 3 3 3  0 3  Feeling bad or failure about yourself  0 0 0 0 0  Trouble concentrating 3 0 0 0 1  Moving slowly or fidgety/restless 0 0 1 0 3  Suicidal thoughts 0 0 0 0 0  PHQ-9 Score 15 10 14  0 19  Difficult doing work/chores Extremely  dIfficult Extremely dIfficult Very difficult Not difficult at all Somewhat difficult  Denies depression - reports has a good mood and is grateful to be alive. Just dealing with health issues.     08/24/2022   10:54 AM 08/25/2022    9:54 AM 09/26/2022    7:06 AM 12/19/2022   10:28 AM 03/12/2023    2:42 PM  Fall Risk  Falls in the past year?   1 0 1  Was there an injury with Fall?   0 0 0  Fall Risk Category Calculator   1 0 2  Fall Risk Category (Retired)   Low    (RETIRED) Patient Fall Risk Level Low fall risk Low fall risk Low fall risk    Patient at Risk for Falls Due to   History of  fall(s) Other (Comment) Other (Comment)  Fall risk Follow up   Falls evaluation completed Falls evaluation completed Falls evaluation completed  Trying to be very cautious. Uses Rolator walker.    SUMMARY AND PLAN:  Encounter for Medicare annual wellness exam  Encounter for screening mammogram for malignant neoplasm of breast - Plan: MM 3D SCREENING MAMMOGRAM BILATERAL BREAST  Estrogen deficiency - Plan: DG Bone Density   Discussed applicable health maintenance/preventive health measures and advised and referred or ordered per patient preferences: -she wanted [place] to order the mammogram and bone density, advised to call to check on this if not contacted in the next 1-2 weeks -discussed vaccines and pt plans to get at the pharmacy -pt wants to call GI about her colonoscopy - number provided Health Maintenance  Topic Date Due   COVID-19 Vaccine (1) Never done   MAMMOGRAM  Never done   DEXA SCAN  Never done   Colonoscopy  01/12/2021   Zoster Vaccines- Shingrix (1 of 2) 10/22/2023 (Originally 02/17/2001)   INFLUENZA VACCINE  05/21/2023   Medicare Annual Wellness (AWV)  03/11/2024   Pneumonia Vaccine 74+ Years old  Completed   Hepatitis C Screening  Completed   HPV VACCINES  Aged Out   DTaP/Tdap/Td  Discontinued    Education and counseling on the following was provided based on the above review of health and a plan/checklist for the patient, along with additional information discussed, was provided for the patient in the patient instructions :  -Advised on importance of completing advanced directives, discussed options for completing and provided information in patient instructions as well -Provided counseling and plan for increased risk of falling if applicable per above screening. Provided safe balance exercises that can be done at home to improve balance and discussed exercise guidelines for adults with include balance exercises at least 3 days per week.  -Advised and counseled on a  healthy lifestyle - including the importance of a healthy diet, regular physical activity, social connections and stress management. -Reviewed patient's current diet. A summary of a healthy diet was provided in the Patient Instructions.  -reviewed patient's current physical activity level and discussed exercise guidelines for adults. Discussed chair exercises.  -Advise yearly dental visits at minimum and regular eye exams   Follow up: see patient instructions     Patient Instructions  I really enjoyed getting to talk with you today! I am available on Tuesdays and Thursdays for virtual visits if you have any questions or concerns, or if I can be of any further assistance.   CHECKLIST FROM ANNUAL WELLNESS VISIT:  -Follow up (please call to schedule if not scheduled after visit):   -yearly for annual wellness visit  with primary care office  Here is a list of your preventive care/health maintenance measures and the plan for each if any are due:  PLAN For any measures below that may be due:  -we ordered the mammogram and bone density test - please call if you have not been contacted in the next 1-2 weeks -please call your gastroenterologist about the colonoscopy [phone] -can get the vaccines at the pharmacy  Health Maintenance  Topic Date Due   COVID-19 Vaccine (1) Never done   MAMMOGRAM  Never done   DEXA SCAN  Never done   Colonoscopy  01/12/2021   Zoster Vaccines- Shingrix (1 of 2) due   INFLUENZA VACCINE  05/21/2023   Medicare Annual Wellness (AWV)  03/11/2024   Pneumonia Vaccine 44+ Years old  Completed   Hepatitis C Screening  Completed   HPV VACCINES  Aged Out   DTaP/Tdap/Td  Discontinued    -See a dentist at least yearly  -Get your eyes checked and then per your eye specialist's recommendations  -Other issues addressed today:   -I have included below further information regarding a healthy whole foods based diet, physical activity guidelines for adults,  stress management and opportunities for social connections. I hope you find this information useful.   -----------------------------------------------------------------------------------------------------------------------------------------------------------------------------------------------------------------------------------------------------------  NUTRITION: -eat real food: lots of colorful vegetables (half the plate) and fruits -5-7 servings of vegetables and fruits per day (fresh or steamed is best), exp. 2 servings of vegetables with lunch and dinner and 2 servings of fruit per day. Berries and greens such as kale and collards are great choices.  -consume on a regular basis: whole grains (make sure first ingredient on label contains the word "whole"), fresh fruits, fish, nuts, seeds, healthy oils (such as olive oil, avocado oil, grape seed oil) -may eat small amounts of dairy and lean meat on occasion, but avoid processed meats such as ham, bacon, lunch meat, etc. -drink water -try to avoid fast food and pre-packaged foods, processed meat -most experts advise limiting sodium to < 2300mg  per day, should limit further is any chronic conditions such as high blood pressure, heart disease, diabetes, etc. The American Heart Association advised that < 1500mg  is is ideal -try to avoid foods that contain any ingredients with names you do not recognize  -try to avoid sugar/sweets (except for the natural sugar that occurs in fresh fruit) -try to avoid sweet drinks -try to avoid white rice, white bread, pasta (unless whole grain), white or yellow potatoes  EXERCISE GUIDELINES FOR ADULTS: -if you wish to increase your physical activity, do so gradually and with the approval of your doctor -STOP and seek medical care immediately if you have any chest pain, chest discomfort or trouble breathing when starting or increasing exercise  -move and stretch your body, legs, feet and arms when sitting for long  periods -Physical activity guidelines for optimal health in adults: -least 150 minutes per week of aerobic exercise (can talk, but not sing) once approved by your doctor, 20-30 minutes of sustained activity or two 10 minute episodes of sustained activity every day.  -resistance training at least 2 days per week if approved by your doctor -balance exercises 3+ days per week:   Stand somewhere where you have something sturdy to hold onto if you lose balance.    1) lift up on toes, start with 5x per day and work up to 20x   2) stand and lift on leg straight out to the side so that foot  is a few inches of the floor, start with 5x each side and work up to 20x each side   3) stand on one foot, start with 5 seconds each side and work up to 20 seconds on each side  If you need ideas or help with getting more active:  -Silver sneakers https://tools.silversneakers.com  -Walk with a Doc: http://www.duncan-williams.com/  -try to include resistance (weight lifting/strength building) and balance exercises twice per week: or the following link for ideas: http://castillo-powell.com/  BuyDucts.dk  STRESS MANAGEMENT: -can try meditating, or just sitting quietly with deep breathing while intentionally relaxing all parts of your body for 5 minutes daily -if you need further help with stress, anxiety or depression please follow up with your primary doctor or contact the wonderful folks at [facility]: [phone]  SOCIAL CONNECTIONS: -options in [place] if you wish to engage in more social and exercise related activities:  -Silver sneakers https://tools.silversneakers.com  -Walk with a Doc: http://www.duncan-williams.com/  -Check out the [facility] Active Adults 50+ section on the [place] of [employer] (hiking clubs, book clubs, cards and games, chess, exercise classes, aquatic classes and much more) -  see the website for details: https://www.[place].gov/departments/parks-recreation/active-adults50  -YouTube has lots of exercise videos for different ages and abilities as well  -[facility] (a variety of indoor and outdoor inperson activities for adults). [phone]. [address].  -Virtual Online Classes (a variety of topics): see seniorplanet.org or call [phone]  -consider volunteering at a school, hospice center, church, senior center or elsewhere           Terressa Koyanagi, DO

## 2023-03-12 NOTE — Patient Instructions (Addendum)
I really enjoyed getting to talk with you today! I am available on Tuesdays and Thursdays for virtual visits if you have any questions or concerns, or if I can be of any further assistance.   CHECKLIST FROM ANNUAL WELLNESS VISIT:  -Follow up (please call to schedule if not scheduled after visit):   -yearly for annual wellness visit with primary care office  Here is a list of your preventive care/health maintenance measures and the plan for each if any are due:  PLAN For any measures below that may be due:  -we ordered the mammogram and bone density test - please call if you have not been contacted in the next 1-2 weeks -please call your gastroenterologist about the colonoscopy [phone] -can get the vaccines at the pharmacy  Health Maintenance  Topic Date Due   COVID-19 Vaccine (1) Never done   MAMMOGRAM  Never done   DEXA SCAN  Never done   Colonoscopy  01/12/2021   Zoster Vaccines- Shingrix (1 of 2) due   INFLUENZA VACCINE  05/21/2023   Medicare Annual Wellness (AWV)  03/11/2024   Pneumonia Vaccine 1+ Years old  Completed   Hepatitis C Screening  Completed   HPV VACCINES  Aged Out   DTaP/Tdap/Td  Discontinued    -See a dentist at least yearly  -Get your eyes checked and then per your eye specialist's recommendations  -Other issues addressed today:   -I have included below further information regarding a healthy whole foods based diet, physical activity guidelines for adults, stress management and opportunities for social connections. I hope you find this information useful.   -----------------------------------------------------------------------------------------------------------------------------------------------------------------------------------------------------------------------------------------------------------  NUTRITION: -eat real food: lots of colorful vegetables (half the plate) and fruits -5-7 servings of vegetables and fruits per day (fresh or  steamed is best), exp. 2 servings of vegetables with lunch and dinner and 2 servings of fruit per day. Berries and greens such as kale and collards are great choices.  -consume on a regular basis: whole grains (make sure first ingredient on label contains the word "whole"), fresh fruits, fish, nuts, seeds, healthy oils (such as olive oil, avocado oil, grape seed oil) -may eat small amounts of dairy and lean meat on occasion, but avoid processed meats such as ham, bacon, lunch meat, etc. -drink water -try to avoid fast food and pre-packaged foods, processed meat -most experts advise limiting sodium to < 2300mg  per day, should limit further is any chronic conditions such as high blood pressure, heart disease, diabetes, etc. The American Heart Association advised that < 1500mg  is is ideal -try to avoid foods that contain any ingredients with names you do not recognize  -try to avoid sugar/sweets (except for the natural sugar that occurs in fresh fruit) -try to avoid sweet drinks -try to avoid white rice, white bread, pasta (unless whole grain), white or yellow potatoes  EXERCISE GUIDELINES FOR ADULTS: -if you wish to increase your physical activity, do so gradually and with the approval of your doctor -STOP and seek medical care immediately if you have any chest pain, chest discomfort or trouble breathing when starting or increasing exercise  -move and stretch your body, legs, feet and arms when sitting for long periods -Physical activity guidelines for optimal health in adults: -least 150 minutes per week of aerobic exercise (can talk, but not sing) once approved by your doctor, 20-30 minutes of sustained activity or two 10 minute episodes of sustained activity every day.  -resistance training at least 2 days per week if  approved by your doctor -balance exercises 3+ days per week:   Stand somewhere where you have something sturdy to hold onto if you lose balance.    1) lift up on toes, start with  5x per day and work up to 20x   2) stand and lift on leg straight out to the side so that foot is a few inches of the floor, start with 5x each side and work up to 20x each side   3) stand on one foot, start with 5 seconds each side and work up to 20 seconds on each side  If you need ideas or help with getting more active:  -Silver sneakers https://tools.silversneakers.com  -Walk with a Doc: http://www.duncan-williams.com/  -try to include resistance (weight lifting/strength building) and balance exercises twice per week: or the following link for ideas: http://castillo-powell.com/  BuyDucts.dk  STRESS MANAGEMENT: -can try meditating, or just sitting quietly with deep breathing while intentionally relaxing all parts of your body for 5 minutes daily -if you need further help with stress, anxiety or depression please follow up with your primary doctor or contact the wonderful folks at [facility]: [phone]  SOCIAL CONNECTIONS: -options in [place] if you wish to engage in more social and exercise related activities:  -Silver sneakers https://tools.silversneakers.com  -Walk with a Doc: http://www.duncan-williams.com/  -Check out the [facility] Active Adults 50+ section on the [place] of [employer] (hiking clubs, book clubs, cards and games, chess, exercise classes, aquatic classes and much more) - see the website for details: https://www.[place].gov/departments/parks-recreation/active-adults50  -YouTube has lots of exercise videos for different ages and abilities as well  -[facility] (a variety of indoor and outdoor inperson activities for adults). [phone]. [address]  -Virtual Online Classes (a variety of topics): see seniorplanet.org or call [phone]  -consider volunteering at a school, hospice center, church, senior center or  elsewhere

## 2023-03-12 NOTE — Telephone Encounter (Signed)
Forwarding to Dr. Denyse Amass as Lorain Childes for denial reason(s).

## 2023-03-12 NOTE — Telephone Encounter (Signed)
This was the denial reason. I have no option for appeal or for peer to peer

## 2023-03-13 ENCOUNTER — Ambulatory Visit: Payer: BC Managed Care – PPO | Admitting: Rehabilitative and Restorative Service Providers"

## 2023-03-20 ENCOUNTER — Ambulatory Visit: Payer: BC Managed Care – PPO | Admitting: Rehabilitative and Restorative Service Providers"

## 2023-03-20 ENCOUNTER — Encounter: Payer: Self-pay | Admitting: Rehabilitative and Restorative Service Providers"

## 2023-03-20 DIAGNOSIS — R269 Unspecified abnormalities of gait and mobility: Secondary | ICD-10-CM

## 2023-03-20 DIAGNOSIS — R279 Unspecified lack of coordination: Secondary | ICD-10-CM | POA: Diagnosis not present

## 2023-03-20 DIAGNOSIS — M6281 Muscle weakness (generalized): Secondary | ICD-10-CM | POA: Diagnosis not present

## 2023-03-20 DIAGNOSIS — R293 Abnormal posture: Secondary | ICD-10-CM

## 2023-03-20 DIAGNOSIS — M62838 Other muscle spasm: Secondary | ICD-10-CM

## 2023-03-20 NOTE — Patient Instructions (Signed)

## 2023-03-20 NOTE — Therapy (Signed)
OUTPATIENT PHYSICAL THERAPY FEMALE PELVIC EVALUATION   Patient Name: Darlene Griffith MRN: [id number] DOB:07-Nov-1950, 72 y.o., female Today's Date: 03/20/2023  END OF SESSION:  PT End of Session - 03/20/23 1104     Visit Number 2    Date for PT Re-Evaluation 05/29/23    Authorization Type BCBS    Progress Note Due on Visit 10    PT Start Time 1102    PT Stop Time 1140    PT Time Calculation (min) 38 min    Activity Tolerance Patient tolerated treatment well    Behavior During Therapy WFL for tasks assessed/performed             Past Medical History:  Diagnosis Date   Chest pain    a. 01/2015 Echo: Nl LV fxn, Gr 1 DD, triv AI, mild MR.   Essential hypertension    Hepatic cyst    a. noted on CT 01/2015.   Hyperthyroidism    Multinodular goiter    a. 01/2015 CT chest: multinodular goidter w/ substernal extension of the left lobe of the thyroid assoc w/ rightward deviation of tracheal air column.   Neck pain, chronic    Personal history of colonic polyps    Pulmonary nodules    a. 01/2015 CT Chest: RLL ~ 5mm subpleural nodule - rec f/u in 6-12 mos.   Splenic cyst    a. noted on CT 01/2015.   Past Surgical History:  Procedure Laterality Date   BYPASS GRAFT AORTA TO AORTA Left 11/11/2021   Procedure: BYPASS GRAFT AORTA TO LEFT COMMON  FEMORAL ARTERY;  Surgeon: Leonie Douglas, MD;  Location: [facility] OR;  Service: Vascular;  Laterality: Left;   CERVICAL DISC ARTHROPLASTY N/A 07/05/2019   Procedure: Cervical Six-Seven Artificial disc replacement;  Surgeon: Barnett Abu, MD;  Location: [facility] OR;  Service: Neurosurgery;  Laterality: N/A;  Cervical Six-Seven Artificial disc replacement   CESAREAN SECTION     COLONOSCOPY  01/13/2020   THORACIC AORTIC ENDOVASCULAR STENT GRAFT N/A 11/11/2021   Procedure: THORACIC AORTIC ENDOVASCULAR STENT GRAFT WITH LEFT BRACHIAL  ARTERY ACCESS;  Surgeon: Leonie Douglas, MD;  Location: [facility] OR;  Service: Vascular;  Laterality: N/A;   TONSILLECTOMY     AROUND  5-6 YRS OLD   ULTRASOUND GUIDANCE FOR VASCULAR ACCESS Bilateral 11/11/2021   Procedure: ULTRASOUND GUIDANCE FOR VASCULAR ACCESS;  Surgeon: Leonie Douglas, MD;  Location: [facility] OR;  Service: Vascular;  Laterality: Bilateral;   UPPER GASTROINTESTINAL ENDOSCOPY  01/13/2020   Patient Active Problem List   Diagnosis Date Noted   Seasonal allergic rhinitis 12/19/2022   Urge incontinence of urine 12/19/2022   Incisional hernia, without obstruction or gangrene 09/26/2022   COPD (chronic obstructive pulmonary disease) (HCC) 11/08/2021   AKI (acute kidney injury) (HCC)    Aortic dissection (HCC) 11/06/2021   Gastroesophageal reflux disease without esophagitis    Hyperthyroidism 04/19/2021   Snoring 03/02/2020   Tobacco use 03/02/2020   Genetic testing 02/10/2020   Family history of lung cancer    Personal history of colonic polyps    Dysphagia 01/06/2020   Cervical spondylosis with myelopathy and radiculopathy 07/05/2019   Neck pain, chronic 05/21/2019   Insomnia 07/27/2018   Multinodular goiter 07/14/2018   Chronic fatigue 06/15/2018   Midsternal chest pain 02/18/2015   Chest pain    Cough    Essential hypertension    Shortness of breath    Aortic stenosis    Pain in the chest    EKG  abnormality    Malaise and fatigue    Lung nodule    Splenic cyst    Hepatic cyst    Hyperlipidemia    Hypertension 02/16/2015    PCP: [place], Betty G, MD  REFERRING PROVIDER: Stechschulte, Hyman Hopes, MD   REFERRING DIAG: [phone] (ICD-10-CM) - Incisional hernia, without obstruction or gangrene  THERAPY DIAG:  Muscle weakness (generalized)  Abnormal posture  Unspecified lack of coordination  Abnormality of gait and mobility  Other muscle spasm  Rationale for Evaluation and Treatment: Rehabilitation  ONSET DATE: one year - since leaving hospital last year  SUBJECTIVE:                                                                                                                                                                                            SUBJECTIVE STATEMENT: Pt reports that she had to cancel last week secondary to increased pain.  Patient reports that she is having pain today, but better than last week.  Pt reports that she is still having difficulty with urination, but her MD prescribed her a new medication.   Water- not a lot but I do like sodas  PAIN:  Are you having pain? Yes NPRS scale: 6-7/10 Pain location:  low back, low abdomen  Pain type: burning and sharp Pain description: constant   Aggravating factors: standing, sitting, prolonged walking Relieving factors: rest but not too long  PRECAUTIONS: Fall  WEIGHT BEARING RESTRICTIONS: No  FALLS:  Has patient fallen in last 6 months? No  LIVING ENVIRONMENT: Lives with: lives alone   OCCUPATION: healthcare medications shipping  PLOF: Independent  PATIENT GOALS: to have less pain  PERTINENT HISTORY:    HTN, aortic bypass, c-section, chronic fatigue, SOB, Aortic stenosis,takes nitroglycerin for chest pain,  CHF Complications from surgery last year with iliac artery rupture requiring transverse incision in Lt lower quadrant for retroperitoneal exposure of the L iliac vessel for L iliac to common femoral bypass. Developed hernia, consulted for hernia repair, but per pt MD recommended no surgery.   Sexual abuse: No  BOWEL MOVEMENT: Pain with bowel movement: No Type of bowel movement:Type (Bristol Stool Scale) 2-6, Frequency after all meals (within 10 mins), and Strain No Fully empty rectum: Yes:   Leakage: No Pads: No Fiber supplement: No  URINATION: Pain with urination: No Fully empty bladder: Yes:   Stream: Strong and Weak Urgency: Yes:   Frequency: increased every 30-45 mins, 3-4x night Leakage: Urge to void Pads: Yes: most of the time  INTERCOURSE: Pain with intercourse:  not painful  Ability to have vaginal penetration:  Yes:   Climax: not  painful Marinoff Scale:  0/3  PREGNANCY: Vaginal deliveries 3 Tearing Yes: full 4th grade tearing with breech baby C-section deliveries 1 Currently pregnant No  PROLAPSE: None   OBJECTIVE:   DIAGNOSTIC FINDINGS:  f/u lumbar spinal stenosis w/ bilat leg pain, L>R, w/ L-spine MRI  transcatheter aortic valve replacement and bypass grafting  urinary urgency and has she get up to go to the bathroom multiple times a day.  CT 01/04/2023. IMPRESSION: 1. No acute findings or explanation for the patient's symptoms. 2. Mild disc bulging and facet hypertrophy as described without resulting significant spinal stenosis or nerve root encroachment.  COGNITION: Overall cognitive status: Within functional limits for tasks assessed     SENSATION: Light touch: Appears intact Proprioception: Appears intact  MUSCLE LENGTH: Bil hamstrings and adductors limited by 50%  LUMBAR SPECIAL TESTS:  Single leg stance test: 2s Rt 1s lt , SI Compression/distraction test: pain with both, and FABER test: pain bil  TUG:31s with rolling walker  GAIT: Distance walked: 150' Assistive device utilized: Environmental consultant - 4 wheeled Level of assistance: Modified independence Comments: decreased cadence, very forward trunk, decreased stride length, decreased step height bil  POSTURE: rounded shoulders, forward head, anterior pelvic tilt, and flexed trunk    LUMBARAROM/PROM:  A/PROM A/PROM  eval  Flexion Limited  by 75%  Extension Limited  by 50%  Right lateral flexion Limited  by 50%  Left lateral flexion Limited  by 50%  Right rotation Limited  by 75%  Left rotation Limited  by 75%   (Blank rows = not tested)  LOWER EXTREMITY ROM:  WFL LOWER EXTREMITY MMT:  MMT Right eval Left eval  Hip flexion 4 4  Hip extension 3+ 3+  Hip abduction 3 3  Hip adduction 3+ 3+  Hip internal rotation    Hip external rotation    Knee flexion 4 4  Knee extension 4 4  Ankle dorsiflexion    Ankle plantarflexion    Ankle inversion    Ankle  eversion     PALPATION:   General  TTP throughout abdomen, tight thoracic and lumbar spine, TTP from T4-L5 worse at mid thoracic range, TTP at PSIS bil                External Perineal Exam deferred today                             Internal Pelvic Floor deferred today  Patient confirms identification and approves PT to assess internal pelvic floor and treatment No However agreeable at next appt PELVIC MMT:   MMT eval  Vaginal   Internal Anal Sphincter   External Anal Sphincter   Puborectalis   Diastasis Recti   (Blank rows = not tested)        TONE: Deferred   PROLAPSE: deferred  TODAY'S TREATMENT:  DATE:  03/20/2023 Nustep level 2 x6 minutes with PT present to discuss status Seated hip adduction ball squeeze with pelvic floor contraction 2x10 Seated TA contraction pressing hands to ball on thighs 2x10 Seated hamstring stretch 2x20 sec Seated hip abduction with red theraband and cuing for pelvic floor contraction 2x10 Sit to/from stand 2x5 (hands pressing through thighs) Seated marching  2x10 bilat   DATE:  02/26/23  EVAL  Examination completed, findings reviewed, pt educated on POC, urge drill. Pt motivated to participate in PT and agreeable to attempt recommendations.     PATIENT EDUCATION:  Education details: urge drill Person educated: Patient Education method: Explanation, Demonstration, Tactile cues, Verbal cues, and Handouts Education comprehension: verbalized understanding and returned demonstration  HOME EXERCISE PROGRAM: Access Code: [email or web address] URL: [email or web address] Date: 03/20/2023 Prepared by: Clydie Braun Myya Meenach  Exercises - Seated Pelvic Floor Contraction with Isometric Hip Adduction  - 1 x daily - 7 x weekly - 2 sets - 10 reps - Seated Transversus Abdominis Bracing  - 1 x daily - 7 x weekly - 2 sets - 10  reps - Seated Pelvic Floor Contraction with Hip Abduction and Resistance Loop  - 1 x daily - 7 x weekly - 2 sets - 10 reps - Seated Hamstring Stretch  - 1 x daily - 7 x weekly - 1 sets - 2 reps - 20 sec hold - Sit to Stand  - 1 x daily - 7 x weekly - 2 sets - 5 reps  ASSESSMENT:  CLINICAL IMPRESSION: Ms Muchow presents to skilled PT for initial visit following evaluation.  Asked about handout from last session with urge drills and pt stated that she may have lost it, so provided pt with a new copy.  Patient issued a HEP to perform exercises at home and reviewed all exercises during session today.  Patient able to perform with minimal cuing.  Printed copy of HEP.  Pt with fatigue following session and required a seated recovery period prior to walking out of clinic.  OBJECTIVE IMPAIRMENTS: decreased activity tolerance, decreased coordination, decreased endurance, decreased mobility, difficulty walking, decreased strength, increased fascial restrictions, increased muscle spasms, impaired flexibility, improper body mechanics, postural dysfunction, and pain.   ACTIVITY LIMITATIONS: carrying, lifting, bending, sitting, standing, squatting, transfers, and locomotion level  PARTICIPATION LIMITATIONS: driving, shopping, community activity, and yard work  PERSONAL FACTORS: Time since onset of injury/illness/exacerbation and 1 comorbidity: medical history  are also affecting patient's functional outcome.   REHAB POTENTIAL: Good  CLINICAL DECISION MAKING: Stable/uncomplicated  EVALUATION COMPLEXITY: Low   GOALS: Goals reviewed with patient? Yes  SHORT TERM GOALS: Target date: 03/26/23  Pt to be I with HEP.  Baseline: Goal status: IN PROGRESS  2.  Pt to demonstrate at least 3/5 pelvic floor strength for improved pelvic stability and decreased strain at pelvic floor/ decrease leakage.  Baseline:  Goal status: INITIAL  3.  Pt will have 25% less urgency due to bladder retraining and strengthening   Baseline:  Goal status: INITIAL  4.  Pt to demonstrate at least 4/5 bil hip strength for improved pelvic stability and functional squats without leakage.  Baseline:  Goal status: INITIAL   LONG TERM GOALS: Target date: 05/29/23  Pt to be I with advanced HEP.  Baseline:  Goal status: INITIAL  2.  Pt to demonstrate at least 5/5 bil hip strength for improved pelvic stability and functional squats without leakage.  Baseline:  Goal status: INITIAL  3.  Pt will have 50% less urgency due  to bladder retraining and strengthening  Baseline:  Goal status: INITIAL  4.  Pt to demonstrate improved gait mechanics with improved cadence and posture for at least 250' with LRAD at mod I and min cues for improved mobility at home and for community activity.  Baseline:  Goal status: INITIAL  5.  Pt to complete x10 squats with at least 15# for improved mobility and strength at hips for improved gait and transfers for decreased fall risk.  Baseline:  Goal status: INITIAL  6. PT to demonstrate improved TUG test to no more than 20s with rolling walker for decreased fall risk Baseline:  Goal status: INITIAL  PLAN:  PT FREQUENCY: 1-2x/week  PT DURATION:  12 sessions  PLANNED INTERVENTIONS: Therapeutic exercises, Therapeutic activity, Neuromuscular re-education, Balance training, Gait training, Patient/Family education, Self Care, Joint mobilization, DME instructions, Aquatic Therapy, Dry Needling, Spinal mobilization, Cryotherapy, Moist heat, scar mobilization, Taping, Biofeedback, and Manual therapy  PLAN FOR NEXT SESSION: hip, core, pelvic floor strengthening, activity tolerance, mobility at spine and hips   Reather Laurence, PT, DPT 03/19/2410:59 AM   [facility] [address] [place], [place] [address] Phone # [phone] Fax [phone]

## 2023-03-26 ENCOUNTER — Ambulatory Visit: Payer: BC Managed Care – PPO | Attending: Surgery

## 2023-03-26 DIAGNOSIS — R262 Difficulty in walking, not elsewhere classified: Secondary | ICD-10-CM | POA: Diagnosis not present

## 2023-03-26 DIAGNOSIS — M62838 Other muscle spasm: Secondary | ICD-10-CM | POA: Insufficient documentation

## 2023-03-26 DIAGNOSIS — R252 Cramp and spasm: Secondary | ICD-10-CM | POA: Diagnosis not present

## 2023-03-26 DIAGNOSIS — M545 Low back pain, unspecified: Secondary | ICD-10-CM | POA: Diagnosis not present

## 2023-03-26 DIAGNOSIS — M6281 Muscle weakness (generalized): Secondary | ICD-10-CM | POA: Diagnosis not present

## 2023-03-26 DIAGNOSIS — R293 Abnormal posture: Secondary | ICD-10-CM | POA: Insufficient documentation

## 2023-03-26 DIAGNOSIS — M62830 Muscle spasm of back: Secondary | ICD-10-CM | POA: Diagnosis not present

## 2023-03-26 DIAGNOSIS — R269 Unspecified abnormalities of gait and mobility: Secondary | ICD-10-CM | POA: Diagnosis not present

## 2023-03-26 DIAGNOSIS — M546 Pain in thoracic spine: Secondary | ICD-10-CM | POA: Diagnosis not present

## 2023-03-26 NOTE — Therapy (Signed)
OUTPATIENT PHYSICAL THERAPY FEMALE PELVIC TREATMENT NOTE   Patient Name: Darlene Griffith MRN: [id number] DOB:09-Apr-1951, 72 y.o., female Today's Date: 03/26/2023  END OF SESSION:  PT End of Session - 03/26/23 1532     Visit Number 3    Date for PT Re-Evaluation 05/29/23    Authorization Type [employer]    PT Start Time 1533    PT Stop Time 1612    PT Time Calculation (min) 39 min    Activity Tolerance Patient tolerated treatment well    Behavior During Therapy WFL for tasks assessed/performed             Past Medical History:  Diagnosis Date   Chest pain    a. 01/2015 Echo: Nl LV fxn, Gr 1 DD, triv AI, mild MR.   Essential hypertension    Hepatic cyst    a. noted on CT 01/2015.   Hyperthyroidism    Multinodular goiter    a. 01/2015 CT chest: multinodular goidter w/ substernal extension of the left lobe of the thyroid assoc w/ rightward deviation of tracheal air column.   Neck pain, chronic    Personal history of colonic polyps    Pulmonary nodules    a. 01/2015 CT Chest: RLL ~ 5mm subpleural nodule - rec f/u in 6-12 mos.   Splenic cyst    a. noted on CT 01/2015.   Past Surgical History:  Procedure Laterality Date   BYPASS GRAFT AORTA TO AORTA Left 11/11/2021   Procedure: BYPASS GRAFT AORTA TO LEFT COMMON  FEMORAL ARTERY;  Surgeon: Leonie Douglas, MD;  Location: [facility] OR;  Service: Vascular;  Laterality: Left;   CERVICAL DISC ARTHROPLASTY N/A 07/05/2019   Procedure: Cervical Six-Seven Artificial disc replacement;  Surgeon: Barnett Abu, MD;  Location: [facility] OR;  Service: Neurosurgery;  Laterality: N/A;  Cervical Six-Seven Artificial disc replacement   CESAREAN SECTION     COLONOSCOPY  01/13/2020   THORACIC AORTIC ENDOVASCULAR STENT GRAFT N/A 11/11/2021   Procedure: THORACIC AORTIC ENDOVASCULAR STENT GRAFT WITH LEFT BRACHIAL  ARTERY ACCESS;  Surgeon: Leonie Douglas, MD;  Location: [facility] OR;  Service: Vascular;  Laterality: N/A;   TONSILLECTOMY     AROUND 5-6 YRS OLD   ULTRASOUND  GUIDANCE FOR VASCULAR ACCESS Bilateral 11/11/2021   Procedure: ULTRASOUND GUIDANCE FOR VASCULAR ACCESS;  Surgeon: Leonie Douglas, MD;  Location: [facility] OR;  Service: Vascular;  Laterality: Bilateral;   UPPER GASTROINTESTINAL ENDOSCOPY  01/13/2020   Patient Active Problem List   Diagnosis Date Noted   Seasonal allergic rhinitis 12/19/2022   Urge incontinence of urine 12/19/2022   Incisional hernia, without obstruction or gangrene 09/26/2022   COPD (chronic obstructive pulmonary disease) (HCC) 11/08/2021   AKI (acute kidney injury) (HCC)    Aortic dissection (HCC) 11/06/2021   Gastroesophageal reflux disease without esophagitis    Hyperthyroidism 04/19/2021   Snoring 03/02/2020   Tobacco use 03/02/2020   Genetic testing 02/10/2020   Family history of lung cancer    Personal history of colonic polyps    Dysphagia 01/06/2020   Cervical spondylosis with myelopathy and radiculopathy 07/05/2019   Neck pain, chronic 05/21/2019   Insomnia 07/27/2018   Multinodular goiter 07/14/2018   Chronic fatigue 06/15/2018   Midsternal chest pain 02/18/2015   Chest pain    Cough    Essential hypertension    Shortness of breath    Aortic stenosis    Pain in the chest    EKG abnormality    Malaise and fatigue  Lung nodule    Splenic cyst    Hepatic cyst    Hyperlipidemia    Hypertension 02/16/2015    PCP: [place], Betty G, MD  REFERRING PROVIDER: Stechschulte, Hyman Hopes, MD   REFERRING DIAG: [phone] (ICD-10-CM) - Incisional hernia, without obstruction or gangrene  THERAPY DIAG:  Muscle weakness (generalized)  Abnormal posture  Abnormality of gait and mobility  Other muscle spasm  Acute bilateral low back pain without sciatica  Pain in thoracic spine  Muscle spasm of back  Cramp and spasm  Difficulty in walking, not elsewhere classified  Rationale for Evaluation and Treatment: Rehabilitation  ONSET DATE: one year - since leaving hospital last year  SUBJECTIVE:                                                                                                                                                                                            SUBJECTIVE STATEMENT: Pt reports she was sore from last visit.    Water- not a lot but I do like sodas  PAIN:  Are you having pain? Yes NPRS scale: 6/10 Pain location:  low back, low abdomen  Pain type: burning and sharp Pain description: constant   Aggravating factors: standing, sitting, prolonged walking Relieving factors: rest but not too long  PRECAUTIONS: Fall  WEIGHT BEARING RESTRICTIONS: No  FALLS:  Has patient fallen in last 6 months? No  LIVING ENVIRONMENT: Lives with: lives alone   OCCUPATION: healthcare medications shipping  PLOF: Independent  PATIENT GOALS: to have less pain  PERTINENT HISTORY:    HTN, aortic bypass, c-section, chronic fatigue, SOB, Aortic stenosis,takes nitroglycerin for chest pain,  CHF Complications from surgery last year with iliac artery rupture requiring transverse incision in Lt lower quadrant for retroperitoneal exposure of the L iliac vessel for L iliac to common femoral bypass. Developed hernia, consulted for hernia repair, but per pt MD recommended no surgery.   Sexual abuse: No  BOWEL MOVEMENT: Pain with bowel movement: No Type of bowel movement:Type (Bristol Stool Scale) 2-6, Frequency after all meals (within 10 mins), and Strain No Fully empty rectum: Yes:   Leakage: No Pads: No Fiber supplement: No  URINATION: Pain with urination: No Fully empty bladder: Yes:   Stream: Strong and Weak Urgency: Yes:   Frequency: increased every 30-45 mins, 3-4x night Leakage: Urge to void Pads: Yes: most of the time  INTERCOURSE: Pain with intercourse:  not painful  Ability to have vaginal penetration:  Yes:   Climax: not painful Marinoff Scale: 0/3  PREGNANCY: Vaginal deliveries 3 Tearing Yes: full 4th grade tearing with breech baby C-section deliveries  1 Currently  pregnant No  PROLAPSE: None   OBJECTIVE:   DIAGNOSTIC FINDINGS:  f/u lumbar spinal stenosis w/ bilat leg pain, L>R, w/ L-spine MRI  transcatheter aortic valve replacement and bypass grafting  urinary urgency and has she get up to go to the bathroom multiple times a day.  CT 01/04/2023. IMPRESSION: 1. No acute findings or explanation for the patient's symptoms. 2. Mild disc bulging and facet hypertrophy as described without resulting significant spinal stenosis or nerve root encroachment.  COGNITION: Overall cognitive status: Within functional limits for tasks assessed     SENSATION: Light touch: Appears intact Proprioception: Appears intact  MUSCLE LENGTH: Bil hamstrings and adductors limited by 50%  LUMBAR SPECIAL TESTS:  Single leg stance test: 2s Rt 1s lt , SI Compression/distraction test: pain with both, and FABER test: pain bil  TUG:31s with rolling walker  GAIT: Distance walked: 150' Assistive device utilized: Environmental consultant - 4 wheeled Level of assistance: Modified independence Comments: decreased cadence, very forward trunk, decreased stride length, decreased step height bil  POSTURE: rounded shoulders, forward head, anterior pelvic tilt, and flexed trunk    LUMBARAROM/PROM:  A/PROM A/PROM  eval  Flexion Limited  by 75%  Extension Limited  by 50%  Right lateral flexion Limited  by 50%  Left lateral flexion Limited  by 50%  Right rotation Limited  by 75%  Left rotation Limited  by 75%   (Blank rows = not tested)  LOWER EXTREMITY ROM:  WFL LOWER EXTREMITY MMT:  MMT Right eval Left eval  Hip flexion 4 4  Hip extension 3+ 3+  Hip abduction 3 3  Hip adduction 3+ 3+  Hip internal rotation    Hip external rotation    Knee flexion 4 4  Knee extension 4 4  Ankle dorsiflexion    Ankle plantarflexion    Ankle inversion    Ankle eversion     PALPATION:   General  TTP throughout abdomen, tight thoracic and lumbar spine, TTP from T4-L5 worse  at mid thoracic range, TTP at PSIS bil                External Perineal Exam deferred today                             Internal Pelvic Floor deferred today  Patient confirms identification and approves PT to assess internal pelvic floor and treatment No However agreeable at next appt PELVIC MMT:   MMT eval  Vaginal   Internal Anal Sphincter   External Anal Sphincter   Puborectalis   Diastasis Recti   (Blank rows = not tested)        TONE: Deferred   PROLAPSE: deferred  TODAY'S TREATMENT:                                                                                                                               DATE:  03/26/2023  Nustep level 2 x6 minutes with PT present to discuss status Seated hip adduction ball squeeze with pelvic floor contraction 2x10 Patient needs frequent re-directing: off topic discussion re: family issues Seated TA contraction pressing hands to ball on thighs 2x10 Seated hamstring stretch 2x20 sec Seated hip abduction with blue theraband and cuing for pelvic floor contraction 2x10 Seated march x 20 with blue loop around knees x 20 Seated hip IR with blue loop around ankles x 20 each Sit to/from stand 2x5 (without hands) Side stepping along length of the mat table with blue loop x 3 laps   DATE:  03/20/2023 Nustep level 2 x6 minutes with PT present to discuss status Seated hip adduction ball squeeze with pelvic floor contraction 2x10 Seated TA contraction pressing hands to ball on thighs 2x10 Seated hamstring stretch 2x20 sec Seated hip abduction with red theraband and cuing for pelvic floor contraction 2x10 Sit to/from stand 2x5 (hands pressing through thighs) Seated marching  2x10 bilat   DATE:  02/26/23  EVAL  Examination completed, findings reviewed, pt educated on POC, urge drill. Pt motivated to participate in PT and agreeable to attempt recommendations.     PATIENT EDUCATION:  Education details: urge drill Person educated:  Patient Education method: Explanation, Demonstration, Tactile cues, Verbal cues, and Handouts Education comprehension: verbalized understanding and returned demonstration  HOME EXERCISE PROGRAM: Access Code: [email or web address] URL: [email or web address] Date: 03/20/2023 Prepared by: Clydie Braun Menke  Exercises - Seated Pelvic Floor Contraction with Isometric Hip Adduction  - 1 x daily - 7 x weekly - 2 sets - 10 reps - Seated Transversus Abdominis Bracing  - 1 x daily - 7 x weekly - 2 sets - 10 reps - Seated Pelvic Floor Contraction with Hip Abduction and Resistance Loop  - 1 x daily - 7 x weekly - 2 sets - 10 reps - Seated Hamstring Stretch  - 1 x daily - 7 x weekly - 1 sets - 2 reps - 20 sec hold - Sit to Stand  - 1 x daily - 7 x weekly - 2 sets - 5 reps  ASSESSMENT:  CLINICAL IMPRESSION: Ms Wunderlich has difficulty staying on task.  She was quite talkative but did complete all tasks with fair technique.  Pt with slightly less fatigue today.  No complaints of pain during session.    OBJECTIVE IMPAIRMENTS: decreased activity tolerance, decreased coordination, decreased endurance, decreased mobility, difficulty walking, decreased strength, increased fascial restrictions, increased muscle spasms, impaired flexibility, improper body mechanics, postural dysfunction, and pain.   ACTIVITY LIMITATIONS: carrying, lifting, bending, sitting, standing, squatting, transfers, and locomotion level  PARTICIPATION LIMITATIONS: driving, shopping, community activity, and yard work  PERSONAL FACTORS: Time since onset of injury/illness/exacerbation and 1 comorbidity: medical history  are also affecting patient's functional outcome.   REHAB POTENTIAL: Good  CLINICAL DECISION MAKING: Stable/uncomplicated  EVALUATION COMPLEXITY: Low   GOALS: Goals reviewed with patient? Yes  SHORT TERM GOALS: Target date: 03/26/23  Pt to be I with HEP.  Baseline: Goal status: IN PROGRESS  2.  Pt to demonstrate at  least 3/5 pelvic floor strength for improved pelvic stability and decreased strain at pelvic floor/ decrease leakage.  Baseline:  Goal status: INITIAL  3.  Pt will have 25% less urgency due to bladder retraining and strengthening  Baseline:  Goal status: INITIAL  4.  Pt to demonstrate at least 4/5 bil hip strength for improved pelvic stability and functional squats without leakage.  Baseline:  Goal status: INITIAL   LONG TERM GOALS:  Target date: 05/29/23  Pt to be I with advanced HEP.  Baseline:  Goal status: INITIAL  2.  Pt to demonstrate at least 5/5 bil hip strength for improved pelvic stability and functional squats without leakage.  Baseline:  Goal status: INITIAL  3.  Pt will have 50% less urgency due to bladder retraining and strengthening  Baseline:  Goal status: INITIAL  4.  Pt to demonstrate improved gait mechanics with improved cadence and posture for at least 250' with LRAD at mod I and min cues for improved mobility at home and for community activity.  Baseline:  Goal status: INITIAL  5.  Pt to complete x10 squats with at least 15# for improved mobility and strength at hips for improved gait and transfers for decreased fall risk.  Baseline:  Goal status: INITIAL  6. PT to demonstrate improved TUG test to no more than 20s with rolling walker for decreased fall risk Baseline:  Goal status: INITIAL  PLAN:  PT FREQUENCY: 1-2x/week  PT DURATION:  12 sessions  PLANNED INTERVENTIONS: Therapeutic exercises, Therapeutic activity, Neuromuscular re-education, Balance training, Gait training, Patient/Family education, Self Care, Joint mobilization, DME instructions, Aquatic Therapy, Dry Needling, Spinal mobilization, Cryotherapy, Moist heat, scar mobilization, Taping, Biofeedback, and Manual therapy  PLAN FOR NEXT SESSION: Continue hip, core, pelvic floor strengthening, activity tolerance, mobility at spine and hips   Darria Corvera B. Lucetta Baehr, PT 03/26/23 5:26 PM   [facility] [address], [address] [place], [place] [address] Phone # [phone] Fax [phone]

## 2023-03-27 ENCOUNTER — Ambulatory Visit: Payer: BC Managed Care – PPO | Admitting: Rehabilitative and Restorative Service Providers"

## 2023-04-03 ENCOUNTER — Ambulatory Visit: Payer: BC Managed Care – PPO | Admitting: Physical Therapy

## 2023-04-03 DIAGNOSIS — M62830 Muscle spasm of back: Secondary | ICD-10-CM

## 2023-04-03 DIAGNOSIS — R293 Abnormal posture: Secondary | ICD-10-CM

## 2023-04-03 DIAGNOSIS — M62838 Other muscle spasm: Secondary | ICD-10-CM

## 2023-04-03 DIAGNOSIS — R262 Difficulty in walking, not elsewhere classified: Secondary | ICD-10-CM | POA: Diagnosis not present

## 2023-04-03 DIAGNOSIS — M545 Low back pain, unspecified: Secondary | ICD-10-CM | POA: Diagnosis not present

## 2023-04-03 DIAGNOSIS — R269 Unspecified abnormalities of gait and mobility: Secondary | ICD-10-CM

## 2023-04-03 DIAGNOSIS — M6281 Muscle weakness (generalized): Secondary | ICD-10-CM

## 2023-04-03 DIAGNOSIS — R252 Cramp and spasm: Secondary | ICD-10-CM | POA: Diagnosis not present

## 2023-04-03 DIAGNOSIS — M546 Pain in thoracic spine: Secondary | ICD-10-CM

## 2023-04-03 NOTE — Therapy (Signed)
OUTPATIENT PHYSICAL THERAPY FEMALE PELVIC TREATMENT NOTE   Patient Name: Darlene Griffith MRN: [id number] DOB:[birth year], 72 y.o., female Today's Date: 04/03/2023  END OF SESSION:  PT End of Session - 04/03/23 0937     Visit Number 4    Date for PT Re-Evaluation 05/29/23    Authorization Type BC[employer]    PT Start Time [id number]    PT Stop Time 1015    PT Time Calculation (min) 38 min    Activity Tolerance Patient tolerated treatment well    Behavior During Therapy [facility] for tasks assessed/performed             Past Medical History:  Diagnosis Date   Chest pain    a. 01/2015 Echo: Nl LV fxn, Gr 1 DD, triv AI, mild MR.   Essential hypertension    Hepatic cyst    a. noted on CT 01/2015.   Hyperthyroidism    Multinodular goiter    a. 01/2015 CT chest: multinodular goidter w/ substernal extension of the left lobe of the thyroid assoc w/ rightward deviation of tracheal air column.   Neck pain, chronic    Personal history of colonic polyps    Pulmonary nodules    a. 01/2015 CT Chest: RLL ~ 5mm subpleural nodule - rec f/u in 6-12 mos.   Splenic cyst    a. noted on CT 01/2015.   Past Surgical History:  Procedure Laterality Date   BYPASS GRAFT AORTA TO AORTA Left 11/11/2021   Procedure: BYPASS GRAFT AORTA TO LEFT COMMON  FEMORAL ARTERY;  Surgeon: Leonie Douglas, MD;  Location: [facility] OR;  Service: Vascular;  Laterality: Left;   CERVICAL DISC ARTHROPLASTY N/A 07/05/2019   Procedure: Cervical Six-Seven Artificial disc replacement;  Surgeon: Barnett Abu, MD;  Location: [facility] OR;  Service: Neurosurgery;  Laterality: N/A;  Cervical Six-Seven Artificial disc replacement   CESAREAN SECTION     COLONOSCOPY  01/13/2020   THORACIC AORTIC ENDOVASCULAR STENT GRAFT N/A 11/11/2021   Procedure: THORACIC AORTIC ENDOVASCULAR STENT GRAFT WITH LEFT BRACHIAL  ARTERY ACCESS;  Surgeon: Leonie Douglas, MD;  Location: [facility] OR;  Service: Vascular;  Laterality: N/A;   TONSILLECTOMY     AROUND 5-6 YRS OLD   ULTRASOUND  GUIDANCE FOR VASCULAR ACCESS Bilateral 11/11/2021   Procedure: ULTRASOUND GUIDANCE FOR VASCULAR ACCESS;  Surgeon: Leonie Douglas, MD;  Location: [facility] OR;  Service: Vascular;  Laterality: Bilateral;   UPPER GASTROINTESTINAL ENDOSCOPY  01/13/2020   Patient Active Problem List   Diagnosis Date Noted   Seasonal allergic rhinitis 12/19/2022   Urge incontinence of urine 12/19/2022   Incisional hernia, without obstruction or gangrene 09/26/2022   COPD (chronic obstructive pulmonary disease) (HCC) 11/08/2021   AKI (acute kidney injury) (HCC)    Aortic dissection (HCC) 11/06/2021   Gastroesophageal reflux disease without esophagitis    Hyperthyroidism 04/19/2021   Snoring 03/02/2020   Tobacco use 03/02/2020   Genetic testing 02/10/2020   Family history of lung cancer    Personal history of colonic polyps    Dysphagia 01/06/2020   Cervical spondylosis with myelopathy and radiculopathy 07/05/2019   Neck pain, chronic 05/21/2019   Insomnia 07/27/2018   Multinodular goiter 07/14/2018   Chronic fatigue 06/15/2018   Midsternal chest pain 02/18/2015   Chest pain    Cough    Essential hypertension    Shortness of breath    Aortic stenosis    Pain in the chest    EKG abnormality    Malaise and fatigue  Lung nodule    Splenic cyst    Hepatic cyst    Hyperlipidemia    Hypertension 02/16/2015    PCP: [place], Betty G, MD  REFERRING PROVIDER: Stechschulte, Hyman Hopes, MD   REFERRING DIAG: [phone] (ICD-10-CM) - Incisional hernia, without obstruction or gangrene  THERAPY DIAG:  Muscle weakness (generalized)  Abnormal posture  Abnormality of gait and mobility  Other muscle spasm  Acute bilateral low back pain without sciatica  Muscle spasm of back  Pain in thoracic spine  Cramp and spasm  Difficulty in walking, not elsewhere classified  Rationale for Evaluation and Treatment: Rehabilitation  ONSET DATE: one year - since leaving hospital last year  SUBJECTIVE:                                                                                                                                                                                            SUBJECTIVE STATEMENT: Pt states she has been doing the exercises as much as she is able to.    Water- not a lot but I do like sodas  PAIN:  Are you having pain? Yes NPRS scale: 6/10 Pain location:  low back, low abdomen  Pain type: burning and sharp Pain description: constant   Aggravating factors: standing, sitting, prolonged walking Relieving factors: rest but not too long  PRECAUTIONS: Fall  WEIGHT BEARING RESTRICTIONS: No  FALLS:  Has patient fallen in last 6 months? No  LIVING ENVIRONMENT: Lives with: lives alone   OCCUPATION: healthcare medications shipping  PLOF: Independent  PATIENT GOALS: to have less pain  PERTINENT HISTORY:    HTN, aortic bypass, c-section, chronic fatigue, SOB, Aortic stenosis,takes nitroglycerin for chest pain,  CHF Complications from surgery last year with iliac artery rupture requiring transverse incision in Lt lower quadrant for retroperitoneal exposure of the L iliac vessel for L iliac to common femoral bypass. Developed hernia, consulted for hernia repair, but per pt MD recommended no surgery.   Sexual abuse: No  BOWEL MOVEMENT: Pain with bowel movement: No Type of bowel movement:Type (Bristol Stool Scale) 2-6, Frequency after all meals (within 10 mins), and Strain No Fully empty rectum: Yes:   Leakage: No Pads: No Fiber supplement: No  URINATION: Pain with urination: No Fully empty bladder: Yes:   Stream: Strong and Weak Urgency: Yes:   Frequency: increased every 30-45 mins, 3-4x night Leakage: Urge to void Pads: Yes: most of the time  INTERCOURSE: Pain with intercourse:  not painful  Ability to have vaginal penetration:  Yes:   Climax: not painful Marinoff Scale: 0/3  PREGNANCY: Vaginal deliveries 3 Tearing Yes: full 4th grade tearing with  breech  baby C-section deliveries 1 Currently pregnant No  PROLAPSE: None   OBJECTIVE:   DIAGNOSTIC FINDINGS:  f/u lumbar spinal stenosis w/ bilat leg pain, L>R, w/ L-spine MRI  transcatheter aortic valve replacement and bypass grafting  urinary urgency and has she get up to go to the bathroom multiple times a day.  CT 01/04/2023. IMPRESSION: 1. No acute findings or explanation for the patient's symptoms. 2. Mild disc bulging and facet hypertrophy as described without resulting significant spinal stenosis or nerve root encroachment.  COGNITION: Overall cognitive status: Within functional limits for tasks assessed     SENSATION: Light touch: Appears intact Proprioception: Appears intact  MUSCLE LENGTH: Bil hamstrings and adductors limited by 50%  LUMBAR SPECIAL TESTS:  Single leg stance test: 2s Rt 1s lt , SI Compression/distraction test: pain with both, and FABER test: pain bil  TUG:31s with rolling walker  GAIT: Distance walked: 150' Assistive device utilized: Environmental consultant - 4 wheeled Level of assistance: Modified independence Comments: decreased cadence, very forward trunk, decreased stride length, decreased step height bil  POSTURE: rounded shoulders, forward head, anterior pelvic tilt, and flexed trunk    LUMBARAROM/PROM:  A/PROM A/PROM  eval  Flexion Limited  by 75%  Extension Limited  by 50%  Right lateral flexion Limited  by 50%  Left lateral flexion Limited  by 50%  Right rotation Limited  by 75%  Left rotation Limited  by 75%   (Blank rows = not tested)  LOWER EXTREMITY ROM:  WFL LOWER EXTREMITY MMT:  MMT Right eval Left eval  Hip flexion 4 4  Hip extension 3+ 3+  Hip abduction 3 3  Hip adduction 3+ 3+  Hip internal rotation    Hip external rotation    Knee flexion 4 4  Knee extension 4 4  Ankle dorsiflexion    Ankle plantarflexion    Ankle inversion    Ankle eversion     PALPATION:   General  TTP throughout abdomen, tight thoracic and lumbar  spine, TTP from T4-L5 worse at mid thoracic range, TTP at PSIS bil                External Perineal Exam deferred today                             Internal Pelvic Floor deferred today  Patient confirms identification and approves PT to assess internal pelvic floor and treatment No However agreeable at next appt PELVIC MMT:   MMT eval  Vaginal   Internal Anal Sphincter   External Anal Sphincter   Puborectalis   Diastasis Recti   (Blank rows = not tested)        TONE: Deferred   PROLAPSE: deferred  TODAY'S TREATMENT:  DATE:  04/03/2023 Nustep L1 x 6 min Supine TSA contraction + diaphragmatic 2x10 Supine hip adduction ball with pelvic floor contraction 2x10 Supine clamshell green TB 2x10 Supine march green TB 2x10 Supine bridge 2x10 Sit<>stand green TB around knees 2x10 with 5# KB + pelvic floor contraction Modified deadlift 5# 2x5   DATE:  03/26/2023 Nustep level 2 x6 minutes with PT present to discuss status Seated hip adduction ball squeeze with pelvic floor contraction 2x10 Patient needs frequent re-directing: off topic discussion re: family issues Seated TA contraction pressing hands to ball on thighs 2x10 Seated hamstring stretch 2x20 sec Seated hip abduction with blue theraband and cuing for pelvic floor contraction 2x10 Seated march x 20 with blue loop around knees x 20 Seated hip IR with blue loop around ankles x 20 each Sit to/from stand 2x5 (without hands) Side stepping along length of the mat table with blue loop x 3 laps   DATE:  03/20/2023 Nustep level 2 x6 minutes with PT present to discuss status Seated hip adduction ball squeeze with pelvic floor contraction 2x10 Seated TA contraction pressing hands to ball on thighs 2x10 Seated hamstring stretch 2x20 sec Seated hip abduction with red theraband and cuing for pelvic floor  contraction 2x10 Sit to/from stand 2x5 (hands pressing through thighs) Seated marching  2x10 bilat   DATE:  02/26/23  EVAL  Examination completed, findings reviewed, pt educated on POC, urge drill. Pt motivated to participate in PT and agreeable to attempt recommendations.     PATIENT EDUCATION:  Education details: urge drill Person educated: Patient Education method: Explanation, Demonstration, Tactile cues, Verbal cues, and Handouts Education comprehension: verbalized understanding and returned demonstration  HOME EXERCISE PROGRAM: Access Code: [email or web address] URL: [email or web address] Date: 03/20/2023 Prepared by: Clydie Braun Menke  Exercises - Seated Pelvic Floor Contraction with Isometric Hip Adduction  - 1 x daily - 7 x weekly - 2 sets - 10 reps - Seated Transversus Abdominis Bracing  - 1 x daily - 7 x weekly - 2 sets - 10 reps - Seated Pelvic Floor Contraction with Hip Abduction and Resistance Loop  - 1 x daily - 7 x weekly - 2 sets - 10 reps - Seated Hamstring Stretch  - 1 x daily - 7 x weekly - 1 sets - 2 reps - 20 sec hold - Sit to Stand  - 1 x daily - 7 x weekly - 2 sets - 5 reps  ASSESSMENT:  CLINICAL IMPRESSION: Continued to work on strengthening as pt tolerated. Worked on Architectural technologist.   OBJECTIVE IMPAIRMENTS: decreased activity tolerance, decreased coordination, decreased endurance, decreased mobility, difficulty walking, decreased strength, increased fascial restrictions, increased muscle spasms, impaired flexibility, improper body mechanics, postural dysfunction, and pain.   ACTIVITY LIMITATIONS: carrying, lifting, bending, sitting, standing, squatting, transfers, and locomotion level  PARTICIPATION LIMITATIONS: driving, shopping, community activity, and yard work  PERSONAL FACTORS: Time since onset of injury/illness/exacerbation and 1 comorbidity: medical history  are also affecting patient's functional outcome.   REHAB POTENTIAL: Good  CLINICAL  DECISION MAKING: Stable/uncomplicated  EVALUATION COMPLEXITY: Low   GOALS: Goals reviewed with patient? Yes  SHORT TERM GOALS: Target date: 03/26/23  Pt to be I with HEP.  Baseline: Goal status: IN PROGRESS  2.  Pt to demonstrate at least 3/5 pelvic floor strength for improved pelvic stability and decreased strain at pelvic floor/ decrease leakage.  Baseline:  Goal status: INITIAL  3.  Pt will have 25% less urgency due to bladder retraining and strengthening  Baseline:  Goal status: INITIAL  4.  Pt to demonstrate at least 4/5 bil hip strength for improved pelvic stability and functional squats without leakage.  Baseline:  Goal status: INITIAL   LONG TERM GOALS: Target date: 05/29/23  Pt to be I with advanced HEP.  Baseline:  Goal status: INITIAL  2.  Pt to demonstrate at least 5/5 bil hip strength for improved pelvic stability and functional squats without leakage.  Baseline:  Goal status: INITIAL  3.  Pt will have 50% less urgency due to bladder retraining and strengthening  Baseline:  Goal status: INITIAL  4.  Pt to demonstrate improved gait mechanics with improved cadence and posture for at least 250' with LRAD at mod I and min cues for improved mobility at home and for community activity.  Baseline:  Goal status: INITIAL  5.  Pt to complete x10 squats with at least 15# for improved mobility and strength at hips for improved gait and transfers for decreased fall risk.  Baseline:  Goal status: INITIAL  6. PT to demonstrate improved TUG test to no more than 20s with rolling walker for decreased fall risk Baseline:  Goal status: INITIAL  PLAN:  PT FREQUENCY: 1-2x/week  PT DURATION:  12 sessions  PLANNED INTERVENTIONS: Therapeutic exercises, Therapeutic activity, Neuromuscular re-education, Balance training, Gait training, Patient/Family education, Self Care, Joint mobilization, DME instructions, Aquatic Therapy, Dry Needling, Spinal mobilization, Cryotherapy,  Moist heat, scar mobilization, Taping, Biofeedback, and Manual therapy  PLAN FOR NEXT SESSION: Check STGs. Continue hip, core, pelvic floor strengthening, activity tolerance, mobility at spine and hips   [facility] Randalyn Rhea [place], PT 04/03/23 9:39 AM  [facility] [address], [address] [place], [place] [address] Phone # [phone] Fax [phone]

## 2023-04-06 ENCOUNTER — Other Ambulatory Visit: Payer: Self-pay | Admitting: Family Medicine

## 2023-04-06 DIAGNOSIS — Z1231 Encounter for screening mammogram for malignant neoplasm of breast: Secondary | ICD-10-CM

## 2023-04-09 ENCOUNTER — Ambulatory Visit
Admission: RE | Admit: 2023-04-09 | Discharge: 2023-04-09 | Disposition: A | Discharge status: Home / Self Care | Payer: BC Managed Care – PPO | Source: Ambulatory Visit | Attending: Family Medicine | Admitting: Family Medicine

## 2023-04-09 DIAGNOSIS — Z1231 Encounter for screening mammogram for malignant neoplasm of breast: Secondary | ICD-10-CM | POA: Diagnosis not present

## 2023-04-10 ENCOUNTER — Ambulatory Visit: Payer: BC Managed Care – PPO | Admitting: Physical Therapy

## 2023-04-10 DIAGNOSIS — R293 Abnormal posture: Secondary | ICD-10-CM

## 2023-04-10 DIAGNOSIS — M6281 Muscle weakness (generalized): Secondary | ICD-10-CM | POA: Diagnosis not present

## 2023-04-10 DIAGNOSIS — M545 Low back pain, unspecified: Secondary | ICD-10-CM | POA: Diagnosis not present

## 2023-04-10 DIAGNOSIS — R269 Unspecified abnormalities of gait and mobility: Secondary | ICD-10-CM | POA: Diagnosis not present

## 2023-04-10 DIAGNOSIS — R262 Difficulty in walking, not elsewhere classified: Secondary | ICD-10-CM | POA: Diagnosis not present

## 2023-04-10 DIAGNOSIS — M62838 Other muscle spasm: Secondary | ICD-10-CM

## 2023-04-10 DIAGNOSIS — R252 Cramp and spasm: Secondary | ICD-10-CM

## 2023-04-10 DIAGNOSIS — M546 Pain in thoracic spine: Secondary | ICD-10-CM

## 2023-04-10 DIAGNOSIS — M62830 Muscle spasm of back: Secondary | ICD-10-CM

## 2023-04-10 NOTE — Therapy (Signed)
OUTPATIENT PHYSICAL THERAPY FEMALE PELVIC TREATMENT NOTE   Patient Name: Darlene Griffith MRN: [id number] DOB:04-05-1951, 72 y.o., female Today's Date: 04/10/2023  END OF SESSION:  PT End of Session - 04/10/23 1031     Visit Number 5    Date for PT Re-Evaluation 05/29/23    Authorization Type [employer]    PT Start Time 1020    PT Stop Time 1100    PT Time Calculation (min) 40 min    Activity Tolerance Patient tolerated treatment well    Behavior During Therapy WFL for tasks assessed/performed              Past Medical History:  Diagnosis Date   Chest pain    a. 01/2015 Echo: Nl LV fxn, Gr 1 DD, triv AI, mild MR.   Essential hypertension    Hepatic cyst    a. noted on CT 01/2015.   Hyperthyroidism    Multinodular goiter    a. 01/2015 CT chest: multinodular goidter w/ substernal extension of the left lobe of the thyroid assoc w/ rightward deviation of tracheal air column.   Neck pain, chronic    Personal history of colonic polyps    Pulmonary nodules    a. 01/2015 CT Chest: RLL ~ 5mm subpleural nodule - rec f/u in 6-12 mos.   Splenic cyst    a. noted on CT 01/2015.   Past Surgical History:  Procedure Laterality Date   BYPASS GRAFT AORTA TO AORTA Left 11/11/2021   Procedure: BYPASS GRAFT AORTA TO LEFT COMMON  FEMORAL ARTERY;  Surgeon: Leonie Douglas, MD;  Location: [facility] OR;  Service: Vascular;  Laterality: Left;   CERVICAL DISC ARTHROPLASTY N/A 07/05/2019   Procedure: Cervical Six-Seven Artificial disc replacement;  Surgeon: Barnett Abu, MD;  Location: [facility] OR;  Service: Neurosurgery;  Laterality: N/A;  Cervical Six-Seven Artificial disc replacement   CESAREAN SECTION     COLONOSCOPY  01/13/2020   THORACIC AORTIC ENDOVASCULAR STENT GRAFT N/A 11/11/2021   Procedure: THORACIC AORTIC ENDOVASCULAR STENT GRAFT WITH LEFT BRACHIAL  ARTERY ACCESS;  Surgeon: Leonie Douglas, MD;  Location: [facility] OR;  Service: Vascular;  Laterality: N/A;   TONSILLECTOMY     AROUND 5-6 YRS OLD   ULTRASOUND  GUIDANCE FOR VASCULAR ACCESS Bilateral 11/11/2021   Procedure: ULTRASOUND GUIDANCE FOR VASCULAR ACCESS;  Surgeon: Leonie Douglas, MD;  Location: [facility] OR;  Service: Vascular;  Laterality: Bilateral;   UPPER GASTROINTESTINAL ENDOSCOPY  01/13/2020   Patient Active Problem List   Diagnosis Date Noted   Seasonal allergic rhinitis 12/19/2022   Urge incontinence of urine 12/19/2022   Incisional hernia, without obstruction or gangrene 09/26/2022   COPD (chronic obstructive pulmonary disease) (HCC) 11/08/2021   AKI (acute kidney injury) (HCC)    Aortic dissection (HCC) 11/06/2021   Gastroesophageal reflux disease without esophagitis    Hyperthyroidism 04/19/2021   Snoring 03/02/2020   Tobacco use 03/02/2020   Genetic testing 02/10/2020   Family history of lung cancer    Personal history of colonic polyps    Dysphagia 01/06/2020   Cervical spondylosis with myelopathy and radiculopathy 07/05/2019   Neck pain, chronic 05/21/2019   Insomnia 07/27/2018   Multinodular goiter 07/14/2018   Chronic fatigue 06/15/2018   Midsternal chest pain 02/18/2015   Chest pain    Cough    Essential hypertension    Shortness of breath    Aortic stenosis    Pain in the chest    EKG abnormality    Malaise and fatigue  Lung nodule    Splenic cyst    Hepatic cyst    Hyperlipidemia    Hypertension 02/16/2015    PCP: [place], Betty G, MD  REFERRING PROVIDER: Stechschulte, Hyman Hopes, MD   REFERRING DIAG: [phone] (ICD-10-CM) - Incisional hernia, without obstruction or gangrene  THERAPY DIAG:  Muscle weakness (generalized)  Abnormal posture  Abnormality of gait and mobility  Other muscle spasm  Acute bilateral low back pain without sciatica  Muscle spasm of back  Pain in thoracic spine  Cramp and spasm  Rationale for Evaluation and Treatment: Rehabilitation  ONSET DATE: one year - since leaving hospital last year  SUBJECTIVE:                                                                                                                                                                                            SUBJECTIVE STATEMENT: Pt states low back and abdomen is not too bad.    Water- not a lot but I do like sodas  PAIN:  Are you having pain? Yes NPRS scale: 4/10 Pain location:  low back, low abdomen  Pain type: burning and sharp Pain description: constant   Aggravating factors: standing, sitting, prolonged walking Relieving factors: rest but not too long  PRECAUTIONS: Fall  WEIGHT BEARING RESTRICTIONS: No  FALLS:  Has patient fallen in last 6 months? No  LIVING ENVIRONMENT: Lives with: lives alone   OCCUPATION: healthcare medications shipping  PLOF: Independent  PATIENT GOALS: to have less pain  PERTINENT HISTORY:    HTN, aortic bypass, c-section, chronic fatigue, SOB, Aortic stenosis,takes nitroglycerin for chest pain,  CHF Complications from surgery last year with iliac artery rupture requiring transverse incision in Lt lower quadrant for retroperitoneal exposure of the L iliac vessel for L iliac to common femoral bypass. Developed hernia, consulted for hernia repair, but per pt MD recommended no surgery.   Sexual abuse: No  BOWEL MOVEMENT: Pain with bowel movement: No Type of bowel movement:Type (Bristol Stool Scale) 2-6, Frequency after all meals (within 10 mins), and Strain No Fully empty rectum: Yes:   Leakage: No Pads: No Fiber supplement: No  URINATION: Pain with urination: No Fully empty bladder: Yes:   Stream: Strong and Weak Urgency: Yes:   Frequency: increased every 30-45 mins, 3-4x night Leakage: Urge to void Pads: Yes: most of the time  INTERCOURSE: Pain with intercourse:  not painful  Ability to have vaginal penetration:  Yes:   Climax: not painful Marinoff Scale: 0/3  PREGNANCY: Vaginal deliveries 3 Tearing Yes: full 4th grade tearing with breech baby C-section deliveries 1 Currently pregnant  No  PROLAPSE: None  OBJECTIVE:   DIAGNOSTIC FINDINGS:  f/u lumbar spinal stenosis w/ bilat leg pain, L>R, w/ L-spine MRI  transcatheter aortic valve replacement and bypass grafting  urinary urgency and has she get up to go to the bathroom multiple times a day.  CT 01/04/2023. IMPRESSION: 1. No acute findings or explanation for the patient's symptoms. 2. Mild disc bulging and facet hypertrophy as described without resulting significant spinal stenosis or nerve root encroachment.  COGNITION: Overall cognitive status: Within functional limits for tasks assessed     SENSATION: Light touch: Appears intact Proprioception: Appears intact  MUSCLE LENGTH: Bil hamstrings and adductors limited by 50%  LUMBAR SPECIAL TESTS:  Single leg stance test: 2s Rt 1s lt , SI Compression/distraction test: pain with both, and FABER test: pain bil  TUG:31s with rolling walker  GAIT: Distance walked: 150' Assistive device utilized: Environmental consultant - 4 wheeled Level of assistance: Modified independence Comments: decreased cadence, very forward trunk, decreased stride length, decreased step height bil  POSTURE: rounded shoulders, forward head, anterior pelvic tilt, and flexed trunk    LUMBARAROM/PROM:  A/PROM A/PROM  eval  Flexion Limited  by 75%  Extension Limited  by 50%  Right lateral flexion Limited  by 50%  Left lateral flexion Limited  by 50%  Right rotation Limited  by 75%  Left rotation Limited  by 75%   (Blank rows = not tested)  LOWER EXTREMITY ROM:  WFL LOWER EXTREMITY MMT:  MMT Right eval Left eval  Hip flexion 4 4  Hip extension 3+ 3+  Hip abduction 3 3  Hip adduction 3+ 3+  Hip internal rotation    Hip external rotation    Knee flexion 4 4  Knee extension 4 4  Ankle dorsiflexion    Ankle plantarflexion    Ankle inversion    Ankle eversion     PALPATION:   General  TTP throughout abdomen, tight thoracic and lumbar spine, TTP from T4-L5 worse at mid thoracic range,  TTP at PSIS bil                External Perineal Exam deferred today                             Internal Pelvic Floor deferred today  Patient confirms identification and approves PT to assess internal pelvic floor and treatment No However agreeable at next appt PELVIC MMT:   MMT eval  Vaginal   Internal Anal Sphincter   External Anal Sphincter   Puborectalis   Diastasis Recti   (Blank rows = not tested)        TONE: Deferred   PROLAPSE: deferred  TODAY'S TREATMENT:                                                                                                                              DATE:  04/10/2023 Nustep L5 x 6 min UEs/LEs Seated figure  4 stretch x 30 sec Seated hip flexor stretch 2x 30 sec Self care: scar massage along L abdomen and hip Seated hip flexion 10x5 sec Seated hip ball squeeze + pelvic floor contraction + diaphragmatic breathing 2x10 Modified deadlift 10# 2x5 Sit<>stand + pelvic floor contraction 10# 2x5 Seated pball roll out 3x10 sec  DATE:  04/03/2023 Nustep L1 x 6 min Supine TSA contraction + diaphragmatic 2x10 Supine hip adduction ball with pelvic floor contraction 2x10 Supine clamshell green TB 2x10 Supine march green TB 2x10 Supine bridge 2x10 Sit<>stand green TB around knees 2x10 with 5# KB + pelvic floor contraction Modified deadlift 5# 2x5   DATE:  03/26/2023 Nustep level 2 x6 minutes with PT present to discuss status Seated hip adduction ball squeeze with pelvic floor contraction 2x10 Patient needs frequent re-directing: off topic discussion re: family issues Seated TA contraction pressing hands to ball on thighs 2x10 Seated hamstring stretch 2x20 sec Seated hip abduction with blue theraband and cuing for pelvic floor contraction 2x10 Seated march x 20 with blue loop around knees x 20 Seated hip IR with blue loop around ankles x 20 each Sit to/from stand 2x5 (without hands) Side stepping along length of the mat table with blue  loop x 3 laps   PATIENT EDUCATION:  Education details: urge drill Person educated: Patient Education method: Explanation, Demonstration, Tactile cues, Verbal cues, and Handouts Education comprehension: verbalized understanding and returned demonstration  HOME EXERCISE PROGRAM: Access Code: [email or web address] URL: [email or web address] Date: 03/20/2023 Prepared by: Clydie Braun Menke  Exercises - Seated Pelvic Floor Contraction with Isometric Hip Adduction  - 1 x daily - 7 x weekly - 2 sets - 10 reps - Seated Transversus Abdominis Bracing  - 1 x daily - 7 x weekly - 2 sets - 10 reps - Seated Pelvic Floor Contraction with Hip Abduction and Resistance Loop  - 1 x daily - 7 x weekly - 2 sets - 10 reps - Seated Hamstring Stretch  - 1 x daily - 7 x weekly - 1 sets - 2 reps - 20 sec hold - Sit to Stand  - 1 x daily - 7 x weekly - 2 sets - 5 reps  ASSESSMENT:  CLINICAL IMPRESSION: Performed most of exercises in seated today with good tolerance. Worked on L hip flexor/abdominal wall stretching and discussed scar massage through these areas. Progressed lifting to 10# -- did well with deadlifts but could feel it more in her back with sit<>stand. Got good relief with pball roll forward to stretch low back.   OBJECTIVE IMPAIRMENTS: decreased activity tolerance, decreased coordination, decreased endurance, decreased mobility, difficulty walking, decreased strength, increased fascial restrictions, increased muscle spasms, impaired flexibility, improper body mechanics, postural dysfunction, and pain.   ACTIVITY LIMITATIONS: carrying, lifting, bending, sitting, standing, squatting, transfers, and locomotion level  PARTICIPATION LIMITATIONS: driving, shopping, community activity, and yard work  PERSONAL FACTORS: Time since onset of injury/illness/exacerbation and 1 comorbidity: medical history  are also affecting patient's functional outcome.   REHAB POTENTIAL: Good  CLINICAL DECISION MAKING:  Stable/uncomplicated  EVALUATION COMPLEXITY: Low   GOALS: Goals reviewed with patient? Yes  SHORT TERM GOALS: Target date: 03/26/23  Pt to be I with HEP.  Baseline: Goal status: IN PROGRESS  2.  Pt to demonstrate at least 3/5 pelvic floor strength for improved pelvic stability and decreased strain at pelvic floor/ decrease leakage.  Baseline:  Goal status: INITIAL  3.  Pt will have 25% less urgency due to bladder retraining and strengthening  Baseline:  Goal status: INITIAL  4.  Pt to demonstrate at least 4/5 bil hip strength for improved pelvic stability and functional squats without leakage.  Baseline:  Goal status: INITIAL   LONG TERM GOALS: Target date: 05/29/23  Pt to be I with advanced HEP.  Baseline:  Goal status: INITIAL  2.  Pt to demonstrate at least 5/5 bil hip strength for improved pelvic stability and functional squats without leakage.  Baseline:  Goal status: INITIAL  3.  Pt will have 50% less urgency due to bladder retraining and strengthening  Baseline:  Goal status: INITIAL  4.  Pt to demonstrate improved gait mechanics with improved cadence and posture for at least 250' with LRAD at mod I and min cues for improved mobility at home and for community activity.  Baseline:  Goal status: INITIAL  5.  Pt to complete x10 squats with at least 15# for improved mobility and strength at hips for improved gait and transfers for decreased fall risk.  Baseline:  Goal status: INITIAL  6. PT to demonstrate improved TUG test to no more than 20s with rolling walker for decreased fall risk Baseline:  Goal status: INITIAL  PLAN:  PT FREQUENCY: 1-2x/week  PT DURATION:  12 sessions  PLANNED INTERVENTIONS: Therapeutic exercises, Therapeutic activity, Neuromuscular re-education, Balance training, Gait training, Patient/Family education, Self Care, Joint mobilization, DME instructions, Aquatic Therapy, Dry Needling, Spinal mobilization, Cryotherapy, Moist heat, scar  mobilization, Taping, Biofeedback, and Manual therapy  PLAN FOR NEXT SESSION: Check STGs. Continue hip, core, pelvic floor strengthening, activity tolerance, mobility at spine and hips   [facility] Randalyn Rhea Weston, PT 04/10/23 10:31 AM  [facility] [address] [place], [place] [address] Phone # [phone] Fax [phone]

## 2023-04-16 ENCOUNTER — Encounter: Payer: BC Managed Care – PPO | Admitting: Physical Therapy

## 2023-04-30 ENCOUNTER — Encounter: Payer: BC Managed Care – PPO | Admitting: Physical Therapy

## 2023-05-02 ENCOUNTER — Ambulatory Visit (HOSPITAL_COMMUNITY)
Admission: EM | Admit: 2023-05-02 | Discharge: 2023-05-02 | Disposition: A | Discharge status: Home / Self Care | Payer: BC Managed Care – PPO | Attending: Physician Assistant | Admitting: Physician Assistant

## 2023-05-02 ENCOUNTER — Encounter (HOSPITAL_COMMUNITY): Payer: Self-pay

## 2023-05-02 DIAGNOSIS — H9202 Otalgia, left ear: Secondary | ICD-10-CM

## 2023-05-02 DIAGNOSIS — H66002 Acute suppurative otitis media without spontaneous rupture of ear drum, left ear: Secondary | ICD-10-CM | POA: Diagnosis not present

## 2023-05-02 MED ORDER — IBUPROFEN 800 MG PO TABS
800.0000 mg | ORAL_TABLET | Freq: Once | ORAL | Status: AC
  Administered 2023-05-02: 800 mg via ORAL

## 2023-05-02 MED ORDER — IBUPROFEN 800 MG PO TABS: 800.0000 mg | ORAL_TABLET | Freq: Two times a day (BID) | ORAL | 0 refills | Status: DC | PRN

## 2023-05-02 MED ORDER — IBUPROFEN 800 MG PO TABS: 800.0000 mg | ORAL_TABLET | Freq: Two times a day (BID) | ORAL | 0 refills | Status: AC | PRN

## 2023-05-02 MED ORDER — AMOXICILLIN-POT CLAVULANATE 875-125 MG PO TABS: 1.0000 | ORAL_TABLET | Freq: Two times a day (BID) | ORAL | 0 refills | Status: DC

## 2023-05-02 MED ORDER — IBUPROFEN 800 MG PO TABS
ORAL_TABLET | ORAL | Status: AC
  Filled 2023-05-02: qty 1

## 2023-05-02 NOTE — Discharge Instructions (Addendum)
We are treating you for an ear infection.  Please start Augmentin twice daily for 7 days.  Take this with food as it can upset your stomach.  I have called in a few doses of ibuprofen 800 mg.  I recommend limiting use of this medication is much as possible.  You should not take additional NSAIDs with this medication including aspirin, ibuprofen/Advil, naproxen/Aleve.  You can use Tylenol/acetaminophen for additional symptom relief.  If your symptoms are improving within a few days please return.  If anything worsens you have increasing pain, fever, nausea, vomiting, severe headache you need to be seen immediately.  Follow-up with your primary care next week.

## 2023-05-02 NOTE — ED Provider Notes (Signed)
[facility]    CSN: [id number] Arrival date & time: 05/02/23  1159      History   Chief Complaint Chief Complaint  Patient presents with   Ear Pain    HPI Darlene Griffith is a 72 y.o. female.   Patient presents today with a 6-day history of left otalgia.  She reports that pain is rated 8/9 on a 0-10 pain scale, described as aching with periodic shooting pains, no alleviating factors identified.  She initially had some sore throat but this has since improved.  She denies any vision change, temporal headache, jaw mastication.  She reports that about 1 day after her symptoms began she flew to [place] to attend her son's wedding.  During that time she had persistent and severe pain but was concerned about seeking medical care and foreign country.  She has tried multiple medications including Excedrin, Tylenol, ibuprofen 800 mg, tramadol.  The ibuprofen 800 mg is the only thing that has provided any relief of symptoms and so she has been using this regularly to manage her pain.  She denies any recent antibiotics.  Denies any cough, congestion, fever, nausea, vomiting.  Denies any otorrhea.  She does not use earbuds or earplugs.  She feels that her voice is reverberating in her head she is having difficulty hearing out of that ear.    Past Medical History:  Diagnosis Date   Chest pain    a. 01/2015 Echo: Nl LV fxn, Gr 1 DD, triv AI, mild MR.   Essential hypertension    Hepatic cyst    a. noted on CT 01/2015.   Hyperthyroidism    Multinodular goiter    a. 01/2015 CT chest: multinodular goidter w/ substernal extension of the left lobe of the thyroid assoc w/ rightward deviation of tracheal air column.   Neck pain, chronic    Personal history of colonic polyps    Pulmonary nodules    a. 01/2015 CT Chest: RLL ~ 5mm subpleural nodule - rec f/u in 6-12 mos.   Splenic cyst    a. noted on CT 01/2015.    Patient Active Problem List   Diagnosis Date Noted   Seasonal allergic  rhinitis 12/19/2022   Urge incontinence of urine 12/19/2022   Incisional hernia, without obstruction or gangrene 09/26/2022   COPD (chronic obstructive pulmonary disease) (HCC) 11/08/2021   AKI (acute kidney injury) (HCC)    Aortic dissection (HCC) 11/06/2021   Gastroesophageal reflux disease without esophagitis    Hyperthyroidism 04/19/2021   Snoring 03/02/2020   Tobacco use 03/02/2020   Genetic testing 02/10/2020   Family history of lung cancer    Personal history of colonic polyps    Dysphagia 01/06/2020   Cervical spondylosis with myelopathy and radiculopathy 07/05/2019   Neck pain, chronic 05/21/2019   Insomnia 07/27/2018   Multinodular goiter 07/14/2018   Chronic fatigue 06/15/2018   Midsternal chest pain 02/18/2015   Chest pain    Cough    Essential hypertension    Shortness of breath    Aortic stenosis    Pain in the chest    EKG abnormality    Malaise and fatigue    Lung nodule    Splenic cyst    Hepatic cyst    Hyperlipidemia    Hypertension 02/16/2015    Past Surgical History:  Procedure Laterality Date   BYPASS GRAFT AORTA TO AORTA Left 11/11/2021   Procedure: BYPASS GRAFT AORTA TO LEFT COMMON  FEMORAL ARTERY;  Surgeon:  Leonie Douglas, MD;  Location: [facility] OR;  Service: Vascular;  Laterality: Left;   CERVICAL DISC ARTHROPLASTY N/A 07/05/2019   Procedure: Cervical Six-Seven Artificial disc replacement;  Surgeon: Barnett Abu, MD;  Location: [facility] OR;  Service: Neurosurgery;  Laterality: N/A;  Cervical Six-Seven Artificial disc replacement   CESAREAN SECTION     COLONOSCOPY  01/13/2020   THORACIC AORTIC ENDOVASCULAR STENT GRAFT N/A 11/11/2021   Procedure: THORACIC AORTIC ENDOVASCULAR STENT GRAFT WITH LEFT BRACHIAL  ARTERY ACCESS;  Surgeon: Leonie Douglas, MD;  Location: [facility] OR;  Service: Vascular;  Laterality: N/A;   TONSILLECTOMY     AROUND 5-6 YRS OLD   ULTRASOUND GUIDANCE FOR VASCULAR ACCESS Bilateral 11/11/2021   Procedure: ULTRASOUND GUIDANCE FOR VASCULAR  ACCESS;  Surgeon: Leonie Douglas, MD;  Location: [facility] OR;  Service: Vascular;  Laterality: Bilateral;   UPPER GASTROINTESTINAL ENDOSCOPY  01/13/2020    OB History     Gravida      Para      Term      Preterm      AB      Living  4      SAB      IAB      Ectopic      Multiple      Live Births               Home Medications    Prior to Admission medications   Medication Sig Start Date End Date Taking? Authorizing Provider  amLODipine (NORVASC) 10 MG tablet Take 1 tablet by mouth once daily 10/06/22  Yes [place], Betty G, MD  amoxicillin-clavulanate (AUGMENTIN) 875-125 MG tablet Take 1 tablet by mouth every 12 (twelve) hours. 05/02/23  Yes Shaivi Rothschild K, PA-C  atorvastatin (LIPITOR) 40 MG tablet Take 1 tablet (40 mg total) by mouth daily. 03/18/22  Yes [place], Betty G, MD  carvedilol (COREG) 25 MG tablet Take 1 tablet (25 mg total) by mouth 2 (two) times daily with a meal. 12/19/22  Yes [place], Betty G, MD  DULoxetine (CYMBALTA) 30 MG capsule Take 1 capsule (30 mg total) by mouth at bedtime. 01/02/22  Yes Raulkar, Drema Pry, MD  fluticasone (FLONASE) 50 MCG/ACT nasal spray Place 2 sprays into both nostrils at bedtime as needed for allergies or rhinitis. 12/19/22  Yes [place], Betty G, MD  ibuprofen (ADVIL) 800 MG tablet Take 1 tablet (800 mg total) by mouth 2 (two) times daily as needed. 05/02/23  Yes Duane Earnshaw K, PA-C  losartan (COZAAR) 25 MG tablet Take 1 tablet (25 mg total) by mouth daily. 03/18/22  Yes [place], Betty G, MD  mirabegron ER (MYRBETRIQ) 25 MG TB24 tablet Take 1 tablet (25 mg total) by mouth daily. 12/19/22  Yes [place], Betty G, MD  pantoprazole (PROTONIX) 40 MG tablet Take 1 tablet (40 mg total) by mouth at bedtime. 03/18/22  Yes [place], Betty G, MD  prochlorperazine (COMPAZINE) 5 MG tablet Take 1-2 tablets (5-10 mg total) by mouth every 6 (six) hours as needed for nausea. 12/03/21  Yes Setzer, Lynnell Jude, PA-C  tiZANidine (ZANAFLEX) 4 MG tablet Take 1 tablet (4 mg  total) by mouth every 6 (six) hours as needed for muscle spasms. 01/28/22  Yes Rodolph Bong, MD  traZODone (DESYREL) 50 MG tablet Take 0.5-1 tablets (25-50 mg total) by mouth at bedtime as needed for sleep. 12/25/21  Yes Rodolph Bong, MD  acetaminophen (TYLENOL) 325 MG tablet Take 1-2 tablets (325-650 mg total) by mouth every  4 (four) hours as needed for mild pain. 12/03/21   Setzer, Lynnell Jude, PA-C  ascorbic acid (VITAMIN C) 1000 MG tablet Take 1 tablet (1,000 mg total) by mouth daily. 03/18/22   [place], Betty G, MD  clotrimazole-betamethasone Thurmond Butts) cream Apply to affected area 2 times daily prn 04/24/20   Eustace Moore, MD  diclofenac Sodium (VOLTAREN) 1 % GEL Apply 4 g topically 4 (four) times daily. 04/28/21   Melene Plan, DO  Ipratropium-Albuterol (COMBIVENT RESPIMAT) 20-100 MCG/ACT AERS respimat Inhale 1 puff into the lungs in the morning, at noon, and at bedtime. 12/19/22   [place], Betty G, MD  methimazole (TAPAZOLE) 5 MG tablet Take 0.5 tablets (2.5 mg total) by mouth 3 (three) times daily. Keep appt with endocrinologist 11/2022 09/30/22   [place], Betty G, MD  nitroGLYCERIN (NITROSTAT) 0.4 MG SL tablet Place 1 tablet (0.4 mg total) under the tongue every 5 (five) minutes as needed for chest pain. 01/04/23   Cur[place], Adam, DO  polyethylene glycol (MIRALAX / GLYCOLAX) 17 g packet Take 17 g by mouth daily as needed for mild constipation. 12/03/21   Setzer, Lynnell Jude, PA-C  senna (SENOKOT) 8.6 MG TABS tablet Take 2 tablets (17.2 mg total) by mouth at bedtime. 12/03/21   Setzer, Lynnell Jude, PA-C    Family History Family History  Problem Relation Age of Onset   Heart attack Maternal Grandmother        deceased   High blood pressure Mother    High Cholesterol Mother    Thyroid disease Mother    Thyroid disease Sister    Lung cancer Father    Cancer Maternal Uncle        unk type   Cancer Sister        unk type   Colon cancer Neg Hx    Colon polyps Neg Hx    Esophageal cancer Neg Hx     Rectal cancer Neg Hx    Stomach cancer Neg Hx     Social History Social History   Tobacco Use   Smoking status: Light Smoker    Types: Cigarettes   Smokeless tobacco: Never   Tobacco comments:    1-2 NOT EVERY DAY   Vaping Use   Vaping status: Never Used  Substance Use Topics   Alcohol use: Yes    Alcohol/week: 0.0 standard drinks of alcohol    Comment: rare   Drug use: No     Allergies   Shellfish allergy, Gabapentin, Hm lidocaine patch [lidocaine], and Tape   Review of Systems Review of Systems  Constitutional:  Positive for activity change. Negative for appetite change, fatigue and fever.  HENT:  Positive for ear pain and sore throat. Negative for congestion, ear discharge, sinus pressure and sneezing.   Respiratory:  Negative for cough and shortness of breath.   Cardiovascular:  Negative for chest pain.  Gastrointestinal:  Negative for abdominal pain, diarrhea, nausea and vomiting.     Physical Exam Triage Vital Signs ED Triage Vitals [05/02/23 1232]  Encounter Vitals Group     BP 127/65     Systolic BP Percentile      Diastolic BP Percentile      Pulse Rate 89     Resp 16     Temp 99.6 F (37.6 C)     Temp Source Oral     SpO2 94 %     Weight      Height      Head Circumference  Peak Flow      Pain Score      Pain Loc      Pain Education      Exclude from Growth Chart    No data found.  Updated Vital Signs BP 127/65 (BP Location: Left Arm)   Pulse 89   Temp 99.6 F (37.6 C) (Oral)   Resp 16   SpO2 94%   Visual Acuity Right Eye Distance:   Left Eye Distance:   Bilateral Distance:    Right Eye Near:   Left Eye Near:    Bilateral Near:     Physical Exam Vitals reviewed.  Constitutional:      General: She is awake. She is not in acute distress.    Appearance: Normal appearance. She is well-developed. She is not ill-appearing.     Comments: Very pleasant female appears stated age in no acute distress sitting comfortably in exam  room  HENT:     Head: Normocephalic and atraumatic.     Right Ear: Ear canal and external ear normal. Tympanic membrane is retracted. Tympanic membrane is not erythematous or bulging.     Left Ear: Ear canal and external ear normal. Tympanic membrane is erythematous and bulging.     Nose:     Right Sinus: No maxillary sinus tenderness or frontal sinus tenderness.     Left Sinus: No maxillary sinus tenderness or frontal sinus tenderness.     Mouth/Throat:     Dentition: Has dentures.     Pharynx: Uvula midline. No oropharyngeal exudate or posterior oropharyngeal erythema.  Cardiovascular:     Rate and Rhythm: Normal rate and regular rhythm.     Heart sounds: Normal heart sounds, S1 normal and S2 normal. No murmur heard. Pulmonary:     Effort: Pulmonary effort is normal.     Breath sounds: Normal breath sounds. No wheezing, rhonchi or rales.     Comments: Clear auscultation bilaterally Psychiatric:        Behavior: Behavior is cooperative.      UC Treatments / Results  Labs (all labs ordered are listed, but only abnormal results are displayed) Labs Reviewed - No data to display  EKG   Radiology No results found.  Procedures Procedures (including critical care time)  Medications Ordered in UC Medications  ibuprofen (ADVIL) tablet 800 mg (800 mg Oral Given 05/02/23 1236)    Initial Impression / Assessment and Plan / UC Course  I have reviewed the triage vital signs and the nursing notes.  Pertinent labs & imaging results that were available during my care of the patient were reviewed by me and considered in my medical decision making (see chart for details).     Patient is well-appearing, afebrile, nontoxic, nontachycardic.  Otitis media was identified on physical exam.  She was started on Augmentin twice daily for 7 days.  No indication for dose adjustment based on metabolic panel from 01/07/2023 with creatinine of 0.55 and calculated creatinine clearance of 106.59  mL/min.  She reports the only pain medication that is helping her manage the symptoms is ibuprofen 800.  We discussed that typically we like to limit use of high-dose NSAIDs but she was given 6 tablets to be used sparingly.  Discussed that she is not to take additional NSAIDs with this medication due to risk of GI bleeding.  She can use Tylenol for additional pain relief.  Recommend that she follow-up with her primary care first thing next week to ensure improvement of symptoms.  If she has any worsening symptoms she needs to be seen emergently to which she expressed understanding.  All questions were answered to patient satisfaction.  Final Clinical Impressions(s) / UC Diagnoses   Final diagnoses:  Non-recurrent acute suppurative otitis media of left ear without spontaneous rupture of tympanic membrane  Left ear pain     Discharge Instructions      We are treating you for an ear infection.  Please start Augmentin twice daily for 7 days.  Take this with food as it can upset your stomach.  I have called in a few doses of ibuprofen 800 mg.  I recommend limiting use of this medication is much as possible.  You should not take additional NSAIDs with this medication including aspirin, ibuprofen/Advil, naproxen/Aleve.  You can use Tylenol/acetaminophen for additional symptom relief.  If your symptoms are improving within a few days please return.  If anything worsens you have increasing pain, fever, nausea, vomiting, severe headache you need to be seen immediately.  Follow-up with your primary care next week.     ED Prescriptions     Medication Sig Dispense Auth. Provider   amoxicillin-clavulanate (AUGMENTIN) 875-125 MG tablet Take 1 tablet by mouth every 12 (twelve) hours. 14 tablet Ozzie Remmers K, PA-C   ibuprofen (ADVIL) 800 MG tablet Take 1 tablet (800 mg total) by mouth 2 (two) times daily as needed. 6 tablet Kamesha Herne, Noberto Retort, PA-C      PDMP not reviewed this encounter.   Jeani Hawking,  PA-C 05/02/23 1258

## 2023-05-02 NOTE — ED Triage Notes (Signed)
Pt reports left ear pain x 1 week. 

## 2023-05-05 DIAGNOSIS — H9012 Conductive hearing loss, unilateral, left ear, with unrestricted hearing on the contralateral side: Secondary | ICD-10-CM | POA: Diagnosis not present

## 2023-05-05 DIAGNOSIS — K120 Recurrent oral aphthae: Secondary | ICD-10-CM | POA: Diagnosis not present

## 2023-05-05 DIAGNOSIS — H6502 Acute serous otitis media, left ear: Secondary | ICD-10-CM | POA: Diagnosis not present

## 2023-05-06 ENCOUNTER — Ambulatory Visit (INDEPENDENT_AMBULATORY_CARE_PROVIDER_SITE_OTHER): Payer: BC Managed Care – PPO | Admitting: Family Medicine

## 2023-05-06 ENCOUNTER — Telehealth: Payer: Self-pay

## 2023-05-06 VITALS — BP 132/84 | HR 96 | Ht 61.0 in | Wt 150.0 lb

## 2023-05-06 DIAGNOSIS — M546 Pain in thoracic spine: Secondary | ICD-10-CM

## 2023-05-06 DIAGNOSIS — G8929 Other chronic pain: Secondary | ICD-10-CM

## 2023-05-06 DIAGNOSIS — M48062 Spinal stenosis, lumbar region with neurogenic claudication: Secondary | ICD-10-CM | POA: Diagnosis not present

## 2023-05-06 MED ORDER — PREDNISONE 50 MG PO TABS: 50.0000 mg | ORAL_TABLET | Freq: Every day | ORAL | 0 refills | Status: DC

## 2023-05-06 NOTE — Telephone Encounter (Signed)
[  1:47 PM] [name] We need to fax Darlene Griffith's accomidation form again. Sounds like her employeer lost it   [1:47 PM] [name] Will aslo need to do FMLA papers again. She will send [place] new forms. It expired.

## 2023-05-06 NOTE — Progress Notes (Signed)
Rubin Payor, PhD, LAT, ATC acting as a scribe for [name], MD.  Darlene Griffith is a 72 y.o. female who presents to [facility] today for f/u LBP. Pt was last seen by Dr. Denyse Amass on 01/30/23 and facet injections were ordered and we tried to clarify her leave situation w/ her case manager. The facet injects order was denied w/ no option of appeal.   Today, pt reports she has been feeling horrible. She has been female pelvic PT. She is wondering if her insurance would approved another thoracic back injection. She is taking unprescribed 800mg  IBU that will provided short lived pain relief. She needs her intermittently leave paperwork to be updated.  Dx imaging: 01/22/23 Vasc US ABI 01/17/23 L-spine MRI             01/08/23 L-spine XR 12/23/22 L tib/fib MRI 12/04/22 L tib/fib XR 12/21/21 L-spine & T-spine MRI 05/28/18 L hip/pelvis XR  Pertinent review of systems: No fevers or chills  Relevant historical information: Hypertension.   Exam:  BP 132/84   Pulse 96   Ht 5\' 1"  (1.549 m)   Wt 150 lb (68 kg)   SpO2 95%   BMI 28.34 kg/m  General: Well Developed, well nourished, and in no acute distress.   MSK: Spine: Patient sits leaned over to the left. Otherwise spine is normal-appearing. Nontender palpation midline.  Tender palpation lumbar and thoracic paraspinal musculature.       Assessment and Plan: 72 y.o. female with chronic back pain.  Multifactorial.  She has had some benefit in the past with epidural steroid injection right at the TL junction.  Will try to repeat that injection today.  I was unsuccessful to get facet joint injections approved.  If needed we could consider medial branch block and ablation or even a nerve stimulator.  If this shot does not help would recommend referral to pain management to consider spine nerve stimulator.  I did prescribe her short course of prednisone for immediate pain control as that could be helpful.  We  spent some time talking about her work situation.  Will need to redo her FMLA form.  It has expired as of 2 days ago.  She would like to continue the same intermittent leave structure.  Additionally it seems like you are going to have to send in the accommodation paperwork we completed in March.  It is possible her employer lost it.  It seems like HR kind of dropped the thread with her workplace accommodations and nothing has happened.   PDMP not reviewed this encounter. Orders Placed This Encounter  Procedures   DG INJECT DIAG/THERA/INC NEEDLE/CATH/PLC EPI/LUMB/SAC W/IMG    Standing Status:   Future    Standing Expiration Date:   05/05/2024    Order Specific Question:   Reason for Exam (SYMPTOM  OR DIAGNOSIS REQUIRED)    Answer:   ESI level and technique per radiology.    Order Specific Question:   Preferred Imaging Location?    Answer:   [id number]    Order Specific Question:   Radiology Contrast Protocol - do NOT remove file path    Answer:   [email or web address]   Meds ordered this encounter  Medications   predniSONE (DELTASONE) 50 MG tablet    Sig: Take 1 tablet (50 mg total) by mouth daily.    Dispense:  5 tablet    Refill:  0     Discussed warning signs or  symptoms. Please see discharge instructions. Patient expresses understanding.   The above documentation has been reviewed and is accurate and complete [name], M.D. Total encounter time 30 minutes including face-to-face time with the patient and, reviewing past medical record, and charting on the date of service.

## 2023-05-06 NOTE — Patient Instructions (Addendum)
Thank you for coming in today.   Please call [facility] at [phone] to schedule your spine injection.     Request new FMLA forms.   We will fax the ADA accomodation form again.    Take the prednisone.

## 2023-05-07 ENCOUNTER — Ambulatory Visit: Payer: BC Managed Care – PPO | Attending: Surgery | Admitting: Physical Therapy

## 2023-05-07 DIAGNOSIS — M6281 Muscle weakness (generalized): Secondary | ICD-10-CM | POA: Insufficient documentation

## 2023-05-07 NOTE — Therapy (Signed)
Pt presents for PT treatment however reports she has a terrible ear infection, has been to the doctor but pain is still very bad for her. She did not want to pay the fee for a cancellation. PT informed pt there is no fee at this office. Pt requested not to be seen as she doesn't feel she can participate fully. No charge for visit today.  Otelia Sergeant, PT, DPT 05/07/2411:11 PM

## 2023-05-07 NOTE — Telephone Encounter (Signed)
I was able to find a blank copy of the Employer Accomodation form that I can complete and faxed back.   Called and spoke with daughter, Lawerance Cruel, to inquire about FMLA forms. She will reach out to find out which company is responsible for Yaneth's FMLA and get back to me about getting the forms to Dr. Denyse Amass.

## 2023-05-12 ENCOUNTER — Encounter: Payer: Self-pay | Admitting: Radiology

## 2023-05-12 ENCOUNTER — Telehealth: Payer: Self-pay

## 2023-05-12 NOTE — Telephone Encounter (Signed)
Joni Reining from [facility] called to clarify the ESI order for pt. Discussed w/ Dr. Denyse Amass and he wanted to repeat the ESI she had back in February. Joni Reining at [facility] was able to switch the order to a thoracic ESI and was planning to work on authorization. Pt is scheduled for the injection Thursday, 7/25.

## 2023-05-14 ENCOUNTER — Ambulatory Visit: Payer: BC Managed Care – PPO | Admitting: Physical Therapy

## 2023-05-14 NOTE — Telephone Encounter (Signed)
Accomodation request form completed and placed on Dr. Zollie Pee desk to review and sign.

## 2023-05-14 NOTE — Discharge Instructions (Signed)

## 2023-05-15 ENCOUNTER — Ambulatory Visit
Admission: RE | Admit: 2023-05-15 | Discharge: 2023-05-15 | Disposition: A | Discharge status: Home / Self Care | Payer: BC Managed Care – PPO | Source: Ambulatory Visit | Attending: Family Medicine | Admitting: Family Medicine

## 2023-05-15 DIAGNOSIS — M48062 Spinal stenosis, lumbar region with neurogenic claudication: Secondary | ICD-10-CM

## 2023-05-15 DIAGNOSIS — M47814 Spondylosis without myelopathy or radiculopathy, thoracic region: Secondary | ICD-10-CM | POA: Diagnosis not present

## 2023-05-15 DIAGNOSIS — G8929 Other chronic pain: Secondary | ICD-10-CM

## 2023-05-15 MED ORDER — IOPAMIDOL (ISOVUE-M 300) INJECTION 61%
1.0000 mL | Freq: Once | INTRAMUSCULAR | Status: AC | PRN
  Administered 2023-05-15: 1 mL via EPIDURAL

## 2023-05-15 MED ORDER — TRIAMCINOLONE ACETONIDE 40 MG/ML IJ SUSP (RADIOLOGY)
60.0000 mg | Freq: Once | INTRAMUSCULAR | Status: AC
  Administered 2023-05-15: 60 mg via EPIDURAL

## 2023-05-15 NOTE — Telephone Encounter (Signed)
Form reviewed and signed by Dr. Denyse Amass, placed at the front desk for scanning/faxing.

## 2023-05-21 ENCOUNTER — Ambulatory Visit: Payer: BC Managed Care – PPO | Attending: Surgery | Admitting: Physical Therapy

## 2023-05-21 DIAGNOSIS — M6281 Muscle weakness (generalized): Secondary | ICD-10-CM | POA: Insufficient documentation

## 2023-05-21 DIAGNOSIS — R293 Abnormal posture: Secondary | ICD-10-CM | POA: Diagnosis not present

## 2023-05-21 DIAGNOSIS — R269 Unspecified abnormalities of gait and mobility: Secondary | ICD-10-CM | POA: Diagnosis not present

## 2023-05-21 DIAGNOSIS — M62838 Other muscle spasm: Secondary | ICD-10-CM | POA: Insufficient documentation

## 2023-05-21 DIAGNOSIS — M545 Low back pain, unspecified: Secondary | ICD-10-CM | POA: Diagnosis not present

## 2023-05-21 DIAGNOSIS — M62830 Muscle spasm of back: Secondary | ICD-10-CM | POA: Insufficient documentation

## 2023-05-21 DIAGNOSIS — R279 Unspecified lack of coordination: Secondary | ICD-10-CM | POA: Insufficient documentation

## 2023-05-21 DIAGNOSIS — M546 Pain in thoracic spine: Secondary | ICD-10-CM | POA: Diagnosis not present

## 2023-05-21 NOTE — Patient Instructions (Signed)

## 2023-05-21 NOTE — Therapy (Signed)
OUTPATIENT PHYSICAL THERAPY FEMALE PELVIC TREATMENT NOTE   Patient Name: Darlene Griffith MRN: [id number] DOB:[birth year], 72 y.o., female Today's Date: 05/21/2023  END OF SESSION:  PT End of Session - 05/21/23 1223     Visit Number 6    Date for PT Re-Evaluation 05/29/23    Authorization Type [employer]    Progress Note Due on Visit 10    PT Start Time 1219    PT Stop Time 1300    PT Time Calculation (min) 41 min    Activity Tolerance Patient tolerated treatment well    Behavior During Therapy [facility] for tasks assessed/performed               Past Medical History:  Diagnosis Date   Chest pain    a. 01/2015 Echo: Nl LV fxn, Gr 1 DD, triv AI, mild MR.   Essential hypertension    Hepatic cyst    a. noted on CT 01/2015.   Hyperthyroidism    Multinodular goiter    a. 01/2015 CT chest: multinodular goidter w/ substernal extension of the left lobe of the thyroid assoc w/ rightward deviation of tracheal air column.   Neck pain, chronic    Personal history of colonic polyps    Pulmonary nodules    a. 01/2015 CT Chest: RLL ~ 5mm subpleural nodule - rec f/u in 6-12 mos.   Splenic cyst    a. noted on CT 01/2015.   Past Surgical History:  Procedure Laterality Date   BYPASS GRAFT AORTA TO AORTA Left 11/11/2021   Procedure: BYPASS GRAFT AORTA TO LEFT COMMON  FEMORAL ARTERY;  Surgeon: Leonie Douglas, MD;  Location: [facility] OR;  Service: Vascular;  Laterality: Left;   CERVICAL DISC ARTHROPLASTY N/A 07/05/2019   Procedure: Cervical Six-Seven Artificial disc replacement;  Surgeon: Barnett Abu, MD;  Location: [facility] OR;  Service: Neurosurgery;  Laterality: N/A;  Cervical Six-Seven Artificial disc replacement   CESAREAN SECTION     COLONOSCOPY  01/13/2020   THORACIC AORTIC ENDOVASCULAR STENT GRAFT N/A 11/11/2021   Procedure: THORACIC AORTIC ENDOVASCULAR STENT GRAFT WITH LEFT BRACHIAL  ARTERY ACCESS;  Surgeon: Leonie Douglas, MD;  Location: [facility] OR;  Service: Vascular;  Laterality: N/A;   TONSILLECTOMY      AROUND 5-6 YRS OLD   ULTRASOUND GUIDANCE FOR VASCULAR ACCESS Bilateral 11/11/2021   Procedure: ULTRASOUND GUIDANCE FOR VASCULAR ACCESS;  Surgeon: Leonie Douglas, MD;  Location: [facility] OR;  Service: Vascular;  Laterality: Bilateral;   UPPER GASTROINTESTINAL ENDOSCOPY  01/13/2020   Patient Active Problem List   Diagnosis Date Noted   Seasonal allergic rhinitis 12/19/2022   Urge incontinence of urine 12/19/2022   Incisional hernia, without obstruction or gangrene 09/26/2022   COPD (chronic obstructive pulmonary disease) (HCC) 11/08/2021   AKI (acute kidney injury) (HCC)    Aortic dissection (HCC) 11/06/2021   Gastroesophageal reflux disease without esophagitis    Hyperthyroidism 04/19/2021   Snoring 03/02/2020   Tobacco use 03/02/2020   Genetic testing 02/10/2020   Family history of lung cancer    Personal history of colonic polyps    Dysphagia 01/06/2020   Cervical spondylosis with myelopathy and radiculopathy 07/05/2019   Neck pain, chronic 05/21/2019   Insomnia 07/27/2018   Multinodular goiter 07/14/2018   Chronic fatigue 06/15/2018   Midsternal chest pain 02/18/2015   Chest pain    Cough    Essential hypertension    Shortness of breath    Aortic stenosis    Pain in the chest  EKG abnormality    Malaise and fatigue    Lung nodule    Splenic cyst    Hepatic cyst    Hyperlipidemia    Hypertension 02/16/2015    PCP: [place], Betty G, MD  REFERRING PROVIDER: Stechschulte, Hyman Hopes, MD   REFERRING DIAG: [phone] (ICD-10-CM) - Incisional hernia, without obstruction or gangrene  THERAPY DIAG:  Muscle weakness (generalized)  Abnormal posture  Abnormality of gait and mobility  Other muscle spasm  Rationale for Evaluation and Treatment: Rehabilitation  ONSET DATE: one year - since leaving hospital last year  SUBJECTIVE:                                                                                                                                                                                            SUBJECTIVE STATEMENT: I am feeling a lot better today. Does report has been attempting kegels and sometimes helps with urine but other times has no warning and has a little leakage with urge to go to bathroom. Presents today without walker   Water- not a lot but I do like sodas  PAIN:  Are you having pain? Yes NPRS scale: 2/10 Pain location:  low back, low abdomen  Pain type: burning and sharp Pain description: constant   Aggravating factors: standing, sitting, prolonged walking Relieving factors: rest but not too long  PRECAUTIONS: Fall  WEIGHT BEARING RESTRICTIONS: No  FALLS:  Has patient fallen in last 6 months? No  LIVING ENVIRONMENT: Lives with: lives alone   OCCUPATION: healthcare medications shipping  PLOF: Independent  PATIENT GOALS: to have less pain  PERTINENT HISTORY:    HTN, aortic bypass, c-section, chronic fatigue, SOB, Aortic stenosis,takes nitroglycerin for chest pain,  CHF Complications from surgery last year with iliac artery rupture requiring transverse incision in Lt lower quadrant for retroperitoneal exposure of the L iliac vessel for L iliac to common femoral bypass. Developed hernia, consulted for hernia repair, but per pt MD recommended no surgery.   Sexual abuse: No  BOWEL MOVEMENT: Pain with bowel movement: No Type of bowel movement:Type (Bristol Stool Scale) 2-6, Frequency after all meals (within 10 mins), and Strain No Fully empty rectum: Yes:   Leakage: No Pads: No Fiber supplement: No  URINATION: Pain with urination: No Fully empty bladder: Yes:   Stream: Strong and Weak Urgency: Yes:   Frequency: increased every 30-45 mins, 3-4x night Leakage: Urge to void Pads: Yes: most of the time  INTERCOURSE: Pain with intercourse:  not painful  Ability to have vaginal penetration:  Yes:   Climax: not painful Marinoff Scale: 0/3  PREGNANCY: Vaginal deliveries 3 Tearing Yes:  full 4th grade tearing with  breech baby C-section deliveries 1 Currently pregnant No  PROLAPSE: None   OBJECTIVE:   DIAGNOSTIC FINDINGS:  f/u lumbar spinal stenosis w/ bilat leg pain, L>R, w/ L-spine MRI  transcatheter aortic valve replacement and bypass grafting  urinary urgency and has she get up to go to the bathroom multiple times a day.  CT 01/04/2023. IMPRESSION: 1. No acute findings or explanation for the patient's symptoms. 2. Mild disc bulging and facet hypertrophy as described without resulting significant spinal stenosis or nerve root encroachment.  COGNITION: Overall cognitive status: Within functional limits for tasks assessed     SENSATION: Light touch: Appears intact Proprioception: Appears intact  MUSCLE LENGTH: Bil hamstrings and adductors limited by 50%  LUMBAR SPECIAL TESTS:  Single leg stance test: 2s Rt 1s lt , SI Compression/distraction test: pain with both, and FABER test: pain bil  TUG:31s with rolling walker  GAIT: Distance walked: 150' Assistive device utilized: Environmental consultant - 4 wheeled Level of assistance: Modified independence Comments: decreased cadence, very forward trunk, decreased stride length, decreased step height bil  POSTURE: rounded shoulders, forward head, anterior pelvic tilt, and flexed trunk    LUMBARAROM/PROM:  A/PROM A/PROM  eval  Flexion Limited  by 75%  Extension Limited  by 50%  Right lateral flexion Limited  by 50%  Left lateral flexion Limited  by 50%  Right rotation Limited  by 75%  Left rotation Limited  by 75%   (Blank rows = not tested)  LOWER EXTREMITY ROM:  WFL LOWER EXTREMITY MMT:  MMT Right eval Left eval  Hip flexion 4 4  Hip extension 3+ 3+  Hip abduction 3 3  Hip adduction 3+ 3+  Hip internal rotation    Hip external rotation    Knee flexion 4 4  Knee extension 4 4  Ankle dorsiflexion    Ankle plantarflexion    Ankle inversion    Ankle eversion     PALPATION:   General  TTP throughout abdomen, tight thoracic and  lumbar spine, TTP from T4-L5 worse at mid thoracic range, TTP at PSIS bil                External Perineal Exam deferred today                             Internal Pelvic Floor deferred today  Patient confirms identification and approves PT to assess internal pelvic floor and treatment No However agreeable at next appt PELVIC MMT:   MMT eval  Vaginal   Internal Anal Sphincter   External Anal Sphincter   Puborectalis   Diastasis Recti   (Blank rows = not tested)        TONE: Deferred   PROLAPSE: deferred  TODAY'S TREATMENT:  DATE:  05/21/23  Nustep L5 x 3 min UEs/LEs and L6 3 mins UE/LEs Seated figure 4 stretch x 30 sec Seated cactus stretches x10 Seated TA activation with exhale Seated alt rows 2# x10 each Sit<>stand + pelvic floor contraction  2x5 Hip abduction red loop 2x10  PATIENT EDUCATION:  Education details: urge drill Person educated: Patient Education method: Explanation, Demonstration, Tactile cues, Verbal cues, and Handouts Education comprehension: verbalized understanding and returned demonstration  HOME EXERCISE PROGRAM: Access Code: [email or web address] URL: [email or web address] Date: 03/20/2023 Prepared by: Clydie Braun Menke Updated 05/21/23 - Jamekia Gannett   Exercises - Seated Pelvic Floor Contraction with Isometric Hip Adduction  - 1 x daily - 7 x weekly - 2 sets - 10 reps - Seated Transversus Abdominis Bracing  - 1 x daily - 7 x weekly - 2 sets - 10 reps - Seated Pelvic Floor Contraction with Hip Abduction and Resistance Loop  - 1 x daily - 7 x weekly - 2 sets - 10 reps - Seated Hamstring Stretch  - 1 x daily - 7 x weekly - 1 sets - 2 reps - 20 sec hold - Sit to Stand  - 1 x daily - 7 x weekly - 2 sets - 5 reps  ASSESSMENT:  CLINICAL IMPRESSION: Performed most of exercises in seated today with good tolerance. Worked on L hip  flexor/abdominal wall stretching and discussed scar massage through these areas. Progressed lifting to 10# -- did well with deadlifts but could feel it more in her back with sit<>stand. Got good relief with pball roll forward to stretch low back.   OBJECTIVE IMPAIRMENTS: decreased activity tolerance, decreased coordination, decreased endurance, decreased mobility, difficulty walking, decreased strength, increased fascial restrictions, increased muscle spasms, impaired flexibility, improper body mechanics, postural dysfunction, and pain.   ACTIVITY LIMITATIONS: carrying, lifting, bending, sitting, standing, squatting, transfers, and locomotion level  PARTICIPATION LIMITATIONS: driving, shopping, community activity, and yard work  PERSONAL FACTORS: Time since onset of injury/illness/exacerbation and 1 comorbidity: medical history  are also affecting patient's functional outcome.   REHAB POTENTIAL: Good  CLINICAL DECISION MAKING: Stable/uncomplicated  EVALUATION COMPLEXITY: Low   GOALS: Goals reviewed with patient? Yes  SHORT TERM GOALS: Target date: 03/26/23  Pt to be I with HEP.  Baseline: Goal status: IN PROGRESS  2.  Pt to demonstrate at least 3/5 pelvic floor strength for improved pelvic stability and decreased strain at pelvic floor/ decrease leakage.  Baseline:  Goal status: on going  3.  Pt will have 25% less urgency due to bladder retraining and strengthening  Baseline:  Goal status: on going  4.  Pt to demonstrate at least 4/5 bil hip strength for improved pelvic stability and functional squats without leakage.  Baseline:  Goal status: on going   LONG TERM GOALS: Target date: 05/29/23  Pt to be I with advanced HEP.  Baseline:  Goal status: on going  2.  Pt to demonstrate at least 5/5 bil hip strength for improved pelvic stability and functional squats without leakage.  Baseline:  Goal status: on going  3.  Pt will have 50% less urgency due to bladder retraining and  strengthening  Baseline:  Goal status: on going  4.  Pt to demonstrate improved gait mechanics with improved cadence and posture for at least 250' with LRAD at mod I and min cues for improved mobility at home and for community activity.  Baseline:  Goal status: on going  5.  Pt to complete x10 squats with at least 15# for improved  mobility and strength at hips for improved gait and transfers for decreased fall risk.  Baseline:  Goal status: on going  6. PT to demonstrate improved TUG test to no more than 20s with rolling walker for decreased fall risk Baseline:  Goal status: on going  PLAN:  PT FREQUENCY: 1-2x/week  PT DURATION:  12 sessions  PLANNED INTERVENTIONS: Therapeutic exercises, Therapeutic activity, Neuromuscular re-education, Balance training, Gait training, Patient/Family education, Self Care, Joint mobilization, DME instructions, Aquatic Therapy, Dry Needling, Spinal mobilization, Cryotherapy, Moist heat, scar mobilization, Taping, Biofeedback, and Manual therapy  PLAN FOR NEXT SESSION: Continue hip, core, pelvic floor strengthening, activity tolerance, mobility at spine and hips  Otelia Sergeant, PT, DPT 08/01/241:02 PM  [facility] [address], [address] [place], [place] [address] Phone # [phone] Fax [phone]

## 2023-05-28 ENCOUNTER — Ambulatory Visit: Payer: BC Managed Care – PPO | Admitting: Physical Therapy

## 2023-06-04 ENCOUNTER — Ambulatory Visit: Payer: BC Managed Care – PPO | Admitting: Physical Therapy

## 2023-06-04 DIAGNOSIS — M545 Low back pain, unspecified: Secondary | ICD-10-CM | POA: Diagnosis not present

## 2023-06-04 DIAGNOSIS — M6281 Muscle weakness (generalized): Secondary | ICD-10-CM | POA: Diagnosis not present

## 2023-06-04 DIAGNOSIS — R279 Unspecified lack of coordination: Secondary | ICD-10-CM | POA: Diagnosis not present

## 2023-06-04 DIAGNOSIS — M546 Pain in thoracic spine: Secondary | ICD-10-CM | POA: Diagnosis not present

## 2023-06-04 DIAGNOSIS — R293 Abnormal posture: Secondary | ICD-10-CM | POA: Diagnosis not present

## 2023-06-04 DIAGNOSIS — M62838 Other muscle spasm: Secondary | ICD-10-CM | POA: Diagnosis not present

## 2023-06-04 DIAGNOSIS — M62830 Muscle spasm of back: Secondary | ICD-10-CM | POA: Diagnosis not present

## 2023-06-04 DIAGNOSIS — R269 Unspecified abnormalities of gait and mobility: Secondary | ICD-10-CM | POA: Diagnosis not present

## 2023-06-04 NOTE — Therapy (Signed)
OUTPATIENT PHYSICAL THERAPY FEMALE PELVIC TREATMENT NOTE   Patient Name: Darlene Griffith MRN: [id number] DOB:1951-01-03, 72 y.o., female Today's Date: 06/04/2023  END OF SESSION:  PT End of Session - 06/04/23 1224     Visit Number 7    Date for PT Re-Evaluation 09/04/23    Authorization Type [employer]    Progress Note Due on Visit 17   done at 7   PT Start Time 1224    PT Stop Time 1305    PT Time Calculation (min) 41 min    Activity Tolerance Patient tolerated treatment well    Behavior During Therapy [facility] for tasks assessed/performed               Past Medical History:  Diagnosis Date   Chest pain    a. 01/2015 Echo: Nl LV fxn, Gr 1 DD, triv AI, mild MR.   Essential hypertension    Hepatic cyst    a. noted on CT 01/2015.   Hyperthyroidism    Multinodular goiter    a. 01/2015 CT chest: multinodular goidter w/ substernal extension of the left lobe of the thyroid assoc w/ rightward deviation of tracheal air column.   Neck pain, chronic    Personal history of colonic polyps    Pulmonary nodules    a. 01/2015 CT Chest: RLL ~ 5mm subpleural nodule - rec f/u in 6-12 mos.   Splenic cyst    a. noted on CT 01/2015.   Past Surgical History:  Procedure Laterality Date   BYPASS GRAFT AORTA TO AORTA Left 11/11/2021   Procedure: BYPASS GRAFT AORTA TO LEFT COMMON  FEMORAL ARTERY;  Surgeon: Leonie Douglas, MD;  Location: [facility] OR;  Service: Vascular;  Laterality: Left;   CERVICAL DISC ARTHROPLASTY N/A 07/05/2019   Procedure: Cervical Six-Seven Artificial disc replacement;  Surgeon: Barnett Abu, MD;  Location: [facility] OR;  Service: Neurosurgery;  Laterality: N/A;  Cervical Six-Seven Artificial disc replacement   CESAREAN SECTION     COLONOSCOPY  01/13/2020   THORACIC AORTIC ENDOVASCULAR STENT GRAFT N/A 11/11/2021   Procedure: THORACIC AORTIC ENDOVASCULAR STENT GRAFT WITH LEFT BRACHIAL  ARTERY ACCESS;  Surgeon: Leonie Douglas, MD;  Location: [facility] OR;  Service: Vascular;  Laterality: N/A;    TONSILLECTOMY     AROUND 5-6 YRS OLD   ULTRASOUND GUIDANCE FOR VASCULAR ACCESS Bilateral 11/11/2021   Procedure: ULTRASOUND GUIDANCE FOR VASCULAR ACCESS;  Surgeon: Leonie Douglas, MD;  Location: [facility] OR;  Service: Vascular;  Laterality: Bilateral;   UPPER GASTROINTESTINAL ENDOSCOPY  01/13/2020   Patient Active Problem List   Diagnosis Date Noted   Seasonal allergic rhinitis 12/19/2022   Urge incontinence of urine 12/19/2022   Incisional hernia, without obstruction or gangrene 09/26/2022   COPD (chronic obstructive pulmonary disease) (HCC) 11/08/2021   AKI (acute kidney injury) (HCC)    Aortic dissection (HCC) 11/06/2021   Gastroesophageal reflux disease without esophagitis    Hyperthyroidism 04/19/2021   Snoring 03/02/2020   Tobacco use 03/02/2020   Genetic testing 02/10/2020   Family history of lung cancer    Personal history of colonic polyps    Dysphagia 01/06/2020   Cervical spondylosis with myelopathy and radiculopathy 07/05/2019   Neck pain, chronic 05/21/2019   Insomnia 07/27/2018   Multinodular goiter 07/14/2018   Chronic fatigue 06/15/2018   Midsternal chest pain 02/18/2015   Chest pain    Cough    Essential hypertension    Shortness of breath    Aortic stenosis    Pain  in the chest    EKG abnormality    Malaise and fatigue    Lung nodule    Splenic cyst    Hepatic cyst    Hyperlipidemia    Hypertension 02/16/2015    PCP: [place], Betty G, MD  REFERRING PROVIDER: Stechschulte, Hyman Hopes, MD   REFERRING DIAG: [phone] (ICD-10-CM) - Incisional hernia, without obstruction or gangrene  THERAPY DIAG:  Muscle weakness (generalized)  Abnormal posture  Abnormality of gait and mobility  Other muscle spasm  Rationale for Evaluation and Treatment: Rehabilitation  ONSET DATE: one year - since leaving hospital last year  SUBJECTIVE:                                                                                                                                                                                            SUBJECTIVE STATEMENT: Pt reports improvement with urinary frequency and urgency but still present - now 2x per night now instead of 4x, going to pee every 45 mins still. Leakage is much less often now.  Abdominal pain much better overall, reports overall feels 30% better but most of this improvement has been pelvic/urine symptoms. Back pain feels like the most limiting.  Pt reports she feels like other things are improving but her back pain is what she wants to work on the most now.   Water- not a lot but I do like sodas  PAIN:  Are you having pain? Yes NPRS scale: 10/10 Pain location:  low back  Pain type: burning and sharp Pain description: constant   Aggravating factors: standing, sitting, prolonged walking Relieving factors: rest but not too long  PRECAUTIONS: Fall  WEIGHT BEARING RESTRICTIONS: No  FALLS:  Has patient fallen in last 6 months? No  LIVING ENVIRONMENT: Lives with: lives alone   OCCUPATION: healthcare medications shipping  PLOF: Independent  PATIENT GOALS: to have less pain  PERTINENT HISTORY:    HTN, aortic bypass, c-section, chronic fatigue, SOB, Aortic stenosis,takes nitroglycerin for chest pain,  CHF Complications from surgery last year with iliac artery rupture requiring transverse incision in Lt lower quadrant for retroperitoneal exposure of the L iliac vessel for L iliac to common femoral bypass. Developed hernia, consulted for hernia repair, but per pt MD recommended no surgery.   Sexual abuse: No  BOWEL MOVEMENT: Pain with bowel movement: No Type of bowel movement:Type (Bristol Stool Scale) 2-6, Frequency after all meals (within 10 mins), and Strain No Fully empty rectum: Yes:   Leakage: No Pads: No Fiber supplement: No  URINATION: Pain with urination: No Fully empty bladder: Yes:   Stream: Strong and Weak Urgency: Yes:  Frequency: increased every 30-45 mins, 3-4x night Leakage: Urge  to void Pads: Yes: most of the time  INTERCOURSE: Pain with intercourse:  not painful  Ability to have vaginal penetration:  Yes:   Climax: not painful Marinoff Scale: 0/3  PREGNANCY: Vaginal deliveries 3 Tearing Yes: full 4th grade tearing with breech baby C-section deliveries 1 Currently pregnant No  PROLAPSE: None   OBJECTIVE:   DIAGNOSTIC FINDINGS:   Oswestry Score: 27 / 50 or 54 % 06/04/23   f/u lumbar spinal stenosis w/ bilat leg pain, L>R, w/ L-spine MRI  transcatheter aortic valve replacement and bypass grafting  urinary urgency and has she get up to go to the bathroom multiple times a day.  CT 01/04/2023. IMPRESSION: 1. No acute findings or explanation for the patient's symptoms. 2. Mild disc bulging and facet hypertrophy as described without resulting significant spinal stenosis or nerve root encroachment.  COGNITION: Overall cognitive status: Within functional limits for tasks assessed     SENSATION: Light touch: Appears intact Proprioception: Appears intact  MUSCLE LENGTH: Bil hamstrings and adductors limited by 50%  LUMBAR SPECIAL TESTS:  Single leg stance test: 2s Rt 1s lt , SI Compression/distraction test: pain with both, and FABER test: pain bil  TUG:31s with rolling walker  GAIT: Distance walked: 150' Assistive device utilized: Environmental consultant - 4 wheeled Level of assistance: Modified independence Comments: decreased cadence, very forward trunk, decreased stride length, decreased step height bil  POSTURE: rounded shoulders, forward head, anterior pelvic tilt, and flexed trunk    LUMBARAROM/PROM:  A/PROM A/PROM  eval  Flexion Limited  by 75%  Extension Limited  by 50%  Right lateral flexion Limited  by 50%  Left lateral flexion Limited  by 50%  Right rotation Limited  by 75%  Left rotation Limited  by 75%   (Blank rows = not tested)  LOWER EXTREMITY ROM:  WFL LOWER EXTREMITY MMT:  MMT Right eval Left eval  Hip flexion 4 4  Hip  extension 3+ 3+  Hip abduction 3 3  Hip adduction 3+ 3+  Hip internal rotation    Hip external rotation    Knee flexion 4 4  Knee extension 4 4  Ankle dorsiflexion    Ankle plantarflexion    Ankle inversion    Ankle eversion     PALPATION:   General  TTP throughout abdomen, tight thoracic and lumbar spine, TTP from T4-L5 worse at mid thoracic range, TTP at PSIS bil                External Perineal Exam deferred today                             Internal Pelvic Floor deferred today  Patient confirms identification and approves PT to assess internal pelvic floor and treatment No  PELVIC MMT:   MMT eval  Vaginal   Internal Anal Sphincter   External Anal Sphincter   Puborectalis   Diastasis Recti   (Blank rows = not tested)        TONE: Deferred   PROLAPSE: deferred  TODAY'S TREATMENT:  DATE:  06/04/23  Moist heat at back with x4 layers between skin and pack during stretches  Seated figure 4 stretch 2x 30 sec each Seated cactus stretches x10 Seated TA activation with exhale x20 Reviewed POC and progress with PT, pt reports she has been limited with pain and very motivated to improve with back pain. Does report urinary symptoms are improving and no longer her largest concern, now wants to focus on back pain.  PATIENT EDUCATION:  Education details: urge drill, [id number] Person educated: Patient Education method: Explanation, Demonstration, Tactile cues, Verbal cues, and Handouts Education comprehension: verbalized understanding and returned demonstration  HOME EXERCISE PROGRAM: Access Code: [email or web address] URL: [email or web address] Date: 03/20/2023 Prepared by: Clydie Braun Menke Updated 05/21/23 -    Exercises - Seated Pelvic Floor Contraction with Isometric Hip Adduction  - 1 x daily - 7 x weekly - 2 sets - 10 reps - Seated  Transversus Abdominis Bracing  - 1 x daily - 7 x weekly - 2 sets - 10 reps - Seated Pelvic Floor Contraction with Hip Abduction and Resistance Loop  - 1 x daily - 7 x weekly - 2 sets - 10 reps - Seated Hamstring Stretch  - 1 x daily - 7 x weekly - 1 sets - 2 reps - 20 sec hold - Sit to Stand  - 1 x daily - 7 x weekly - 2 sets - 5 reps  ASSESSMENT:  CLINICAL IMPRESSION: Pt very limited in pain today. States she has been doing better overall but woke up today with terrible pain. Pt states she has been improving with urinary symptoms, urgency is better when out of the home but at home still ~45 mins. Biggest complaint now is back pain and wants this to be focus of PT moving forward. Pt scored 54% on oswestry today and has been progressing toward goals but very limited due to pain. Pt would benefit from additional PT for improved back and hip and core strengthening, posture, gait, standing tolerance and walking tolerance.   OBJECTIVE IMPAIRMENTS: decreased activity tolerance, decreased coordination, decreased endurance, decreased mobility, difficulty walking, decreased strength, increased fascial restrictions, increased muscle spasms, impaired flexibility, improper body mechanics, postural dysfunction, and pain.   ACTIVITY LIMITATIONS: carrying, lifting, bending, sitting, standing, squatting, transfers, and locomotion level  PARTICIPATION LIMITATIONS: driving, shopping, community activity, and yard work  PERSONAL FACTORS: Time since onset of injury/illness/exacerbation and 1 comorbidity: medical history  are also affecting patient's functional outcome.   REHAB POTENTIAL: Good  CLINICAL DECISION MAKING: Stable/uncomplicated  EVALUATION COMPLEXITY: Low   GOALS: Goals reviewed with patient? Yes  SHORT TERM GOALS: Target date: 03/26/23  Pt to be I with HEP.  Baseline: Goal status:MET  2.  Pt to demonstrate at least 3/5 pelvic floor strength for improved pelvic stability and decreased strain  at pelvic floor/ decrease leakage.  Baseline:  Goal status: deferred   3.  Pt will have 25% less urgency due to bladder retraining and strengthening  Baseline:  Goal status: MET  4.  Pt to demonstrate at least 4/5 bil hip strength for improved pelvic stability and functional squats without leakage.  Baseline:  Goal status: MET   LONG TERM GOALS: Target date: 05/29/23  Pt to be I with advanced HEP.  Baseline:  Goal status: on going  2.  Pt to demonstrate at least 5/5 bil hip strength for improved pelvic stability and functional squats without leakage.  Baseline:  Goal status: on going  3.  Pt will have 50% less urgency  due to bladder retraining and strengthening  Baseline:  Goal status: on going  4.  Pt to demonstrate improved gait mechanics with improved cadence and posture for at least 250' with LRAD at mod I and min cues for improved mobility at home and for community activity.  Baseline:  Goal status: on going  5.  Pt to complete x10 squats with at least 10# for improved mobility and strength at hips for improved gait and transfers for decreased fall risk.  Baseline:  Goal status: on going   6. PT to demonstrate improved TUG test to no more than 20s with rolling walker for decreased fall risk Baseline:  Goal status: MET - 11s  7. Pt to score at least 25% better on Owestry for improved tolerance to standing and activity. Baseline:  Goal status: NEW  8. Pt to report ability to stand with/out LRAD for at least 30 mins with minimal increase/no increase in pain for improved tolerance to working.   Baseline:  Goal status: NEW PLAN:  PT FREQUENCY: 1-2x/week  PT DURATION:  12 sessions  PLANNED INTERVENTIONS: Therapeutic exercises, Therapeutic activity, Neuromuscular re-education, Balance training, Gait training, Patient/Family education, Self Care, Joint mobilization, DME instructions, Aquatic Therapy, Dry Needling, Spinal mobilization, Cryotherapy, Moist heat, scar  mobilization, Taping, Biofeedback, and Manual therapy  PLAN FOR NEXT SESSION: Continue hip, core, pelvic floor strengthening, activity tolerance, mobility at spine and hips  Otelia Sergeant, PT, DPT 08/15/241:06 PM  [facility] [address], [address] [place], [place] [address] Phone # [phone] Fax [phone]

## 2023-06-11 ENCOUNTER — Ambulatory Visit: Payer: BC Managed Care – PPO | Admitting: Physical Therapy

## 2023-06-11 DIAGNOSIS — R293 Abnormal posture: Secondary | ICD-10-CM | POA: Diagnosis not present

## 2023-06-11 DIAGNOSIS — R269 Unspecified abnormalities of gait and mobility: Secondary | ICD-10-CM

## 2023-06-11 DIAGNOSIS — M62838 Other muscle spasm: Secondary | ICD-10-CM | POA: Diagnosis not present

## 2023-06-11 DIAGNOSIS — R279 Unspecified lack of coordination: Secondary | ICD-10-CM | POA: Diagnosis not present

## 2023-06-11 DIAGNOSIS — M6281 Muscle weakness (generalized): Secondary | ICD-10-CM

## 2023-06-11 DIAGNOSIS — M546 Pain in thoracic spine: Secondary | ICD-10-CM | POA: Diagnosis not present

## 2023-06-11 DIAGNOSIS — M62830 Muscle spasm of back: Secondary | ICD-10-CM | POA: Diagnosis not present

## 2023-06-11 DIAGNOSIS — M545 Low back pain, unspecified: Secondary | ICD-10-CM | POA: Diagnosis not present

## 2023-06-11 NOTE — Therapy (Signed)
OUTPATIENT PHYSICAL THERAPY FEMALE PELVIC TREATMENT NOTE   Patient Name: Darlene Griffith MRN: [id number] DOB:06-06-1951, 72 y.o., female Today's Date: 06/11/2023  END OF SESSION:  PT End of Session - 06/11/23 1314     Visit Number 8    Date for PT Re-Evaluation 09/04/23    Authorization Type BCBS    Progress Note Due on Visit 17   done at 7   PT Start Time 1230    PT Stop Time 1314    PT Time Calculation (min) 44 min    Activity Tolerance Patient tolerated treatment well    Behavior During Therapy [facility] for tasks assessed/performed                Past Medical History:  Diagnosis Date   Chest pain    a. 01/2015 Echo: Nl LV fxn, Gr 1 DD, triv AI, mild MR.   Essential hypertension    Hepatic cyst    a. noted on CT 01/2015.   Hyperthyroidism    Multinodular goiter    a. 01/2015 CT chest: multinodular goidter w/ substernal extension of the left lobe of the thyroid assoc w/ rightward deviation of tracheal air column.   Neck pain, chronic    Personal history of colonic polyps    Pulmonary nodules    a. 01/2015 CT Chest: RLL ~ 5mm subpleural nodule - rec f/u in 6-12 mos.   Splenic cyst    a. noted on CT 01/2015.   Past Surgical History:  Procedure Laterality Date   BYPASS GRAFT AORTA TO AORTA Left 11/11/2021   Procedure: BYPASS GRAFT AORTA TO LEFT COMMON  FEMORAL ARTERY;  Surgeon: Leonie Douglas, MD;  Location: [facility] OR;  Service: Vascular;  Laterality: Left;   CERVICAL DISC ARTHROPLASTY N/A 07/05/2019   Procedure: Cervical Six-Seven Artificial disc replacement;  Surgeon: Barnett Abu, MD;  Location: [facility] OR;  Service: Neurosurgery;  Laterality: N/A;  Cervical Six-Seven Artificial disc replacement   CESAREAN SECTION     COLONOSCOPY  01/13/2020   THORACIC AORTIC ENDOVASCULAR STENT GRAFT N/A 11/11/2021   Procedure: THORACIC AORTIC ENDOVASCULAR STENT GRAFT WITH LEFT BRACHIAL  ARTERY ACCESS;  Surgeon: Leonie Douglas, MD;  Location: [facility] OR;  Service: Vascular;  Laterality: N/A;    TONSILLECTOMY     AROUND 5-6 YRS OLD   ULTRASOUND GUIDANCE FOR VASCULAR ACCESS Bilateral 11/11/2021   Procedure: ULTRASOUND GUIDANCE FOR VASCULAR ACCESS;  Surgeon: Leonie Douglas, MD;  Location: [facility] OR;  Service: Vascular;  Laterality: Bilateral;   UPPER GASTROINTESTINAL ENDOSCOPY  01/13/2020   Patient Active Problem List   Diagnosis Date Noted   Seasonal allergic rhinitis 12/19/2022   Urge incontinence of urine 12/19/2022   Incisional hernia, without obstruction or gangrene 09/26/2022   COPD (chronic obstructive pulmonary disease) (HCC) 11/08/2021   AKI (acute kidney injury) (HCC)    Aortic dissection (HCC) 11/06/2021   Gastroesophageal reflux disease without esophagitis    Hyperthyroidism 04/19/2021   Snoring 03/02/2020   Tobacco use 03/02/2020   Genetic testing 02/10/2020   Family history of lung cancer    Personal history of colonic polyps    Dysphagia 01/06/2020   Cervical spondylosis with myelopathy and radiculopathy 07/05/2019   Neck pain, chronic 05/21/2019   Insomnia 07/27/2018   Multinodular goiter 07/14/2018   Chronic fatigue 06/15/2018   Midsternal chest pain 02/18/2015   Chest pain    Cough    Essential hypertension    Shortness of breath    Aortic stenosis  Pain in the chest    EKG abnormality    Malaise and fatigue    Lung nodule    Splenic cyst    Hepatic cyst    Hyperlipidemia    Hypertension 02/16/2015    PCP: [place], Betty G, MD  REFERRING PROVIDER: Stechschulte, Hyman Hopes, MD   REFERRING DIAG: [id number] (ICD-10-CM) - Incisional hernia, without obstruction or gangrene  THERAPY DIAG:  Abnormal posture  Muscle weakness (generalized)  Abnormality of gait and mobility  Muscle spasm of back  Rationale for Evaluation and Treatment: Rehabilitation  ONSET DATE: one year - since leaving hospital last year  SUBJECTIVE:                                                                                                                                                                                            SUBJECTIVE STATEMENT: I'm feeling much better this week.   Water- not a lot but I do like sodas  PAIN:  Are you having pain? Yes NPRS scale: 10/10 Pain location:  low back  Pain type: burning and sharp Pain description: constant   Aggravating factors: standing, sitting, prolonged walking Relieving factors: rest but not too long  PRECAUTIONS: Fall  WEIGHT BEARING RESTRICTIONS: No  FALLS:  Has patient fallen in last 6 months? No  LIVING ENVIRONMENT: Lives with: lives alone   OCCUPATION: healthcare medications shipping  PLOF: Independent  PATIENT GOALS: to have less pain  PERTINENT HISTORY:    HTN, aortic bypass, c-section, chronic fatigue, SOB, Aortic stenosis,takes nitroglycerin for chest pain,  CHF Complications from surgery last year with iliac artery rupture requiring transverse incision in Lt lower quadrant for retroperitoneal exposure of the L iliac vessel for L iliac to common femoral bypass. Developed hernia, consulted for hernia repair, but per pt MD recommended no surgery.   Sexual abuse: No  BOWEL MOVEMENT: Pain with bowel movement: No Type of bowel movement:Type (Bristol Stool Scale) 2-6, Frequency after all meals (within 10 mins), and Strain No Fully empty rectum: Yes:   Leakage: No Pads: No Fiber supplement: No  URINATION: Pain with urination: No Fully empty bladder: Yes:   Stream: Strong and Weak Urgency: Yes:   Frequency: increased every 30-45 mins, 3-4x night Leakage: Urge to void Pads: Yes: most of the time  INTERCOURSE: Pain with intercourse:  not painful  Ability to have vaginal penetration:  Yes:   Climax: not painful Marinoff Scale: 0/3  PREGNANCY: Vaginal deliveries 3 Tearing Yes: full 4th grade tearing with breech baby C-section deliveries 1 Currently pregnant No  PROLAPSE: None   OBJECTIVE:   DIAGNOSTIC FINDINGS:   Oswestry Score:  27 / 50 or 54 % 06/04/23   f/u  lumbar spinal stenosis w/ bilat leg pain, L>R, w/ L-spine MRI  transcatheter aortic valve replacement and bypass grafting  urinary urgency and has she get up to go to the bathroom multiple times a day.  CT 01/04/2023. IMPRESSION: 1. No acute findings or explanation for the patient's symptoms. 2. Mild disc bulging and facet hypertrophy as described without resulting significant spinal stenosis or nerve root encroachment.  COGNITION: Overall cognitive status: Within functional limits for tasks assessed     SENSATION: Light touch: Appears intact Proprioception: Appears intact  MUSCLE LENGTH: Bil hamstrings and adductors limited by 50%  LUMBAR SPECIAL TESTS:  Single leg stance test: 2s Rt 1s lt , SI Compression/distraction test: pain with both, and FABER test: pain bil  TUG:31s with rolling walker  GAIT: Distance walked: 150' Assistive device utilized: Environmental consultant - 4 wheeled Level of assistance: Modified independence Comments: decreased cadence, very forward trunk, decreased stride length, decreased step height bil  POSTURE: rounded shoulders, forward head, anterior pelvic tilt, and flexed trunk    LUMBARAROM/PROM:  A/PROM A/PROM  eval  Flexion Limited  by 75%  Extension Limited  by 50%  Right lateral flexion Limited  by 50%  Left lateral flexion Limited  by 50%  Right rotation Limited  by 75%  Left rotation Limited  by 75%   (Blank rows = not tested)  LOWER EXTREMITY ROM:  WFL LOWER EXTREMITY MMT:  MMT Right eval Left eval  Hip flexion 4 4  Hip extension 3+ 3+  Hip abduction 3 3  Hip adduction 3+ 3+  Hip internal rotation    Hip external rotation    Knee flexion 4 4  Knee extension 4 4  Ankle dorsiflexion    Ankle plantarflexion    Ankle inversion    Ankle eversion     PALPATION:   General  TTP throughout abdomen, tight thoracic and lumbar spine, TTP from T4-L5 worse at mid thoracic range, TTP at PSIS bil                External Perineal Exam deferred  today                             Internal Pelvic Floor deferred today  Patient confirms identification and approves PT to assess internal pelvic floor and treatment No  PELVIC MMT:   MMT eval  Vaginal   Internal Anal Sphincter   External Anal Sphincter   Puborectalis   Diastasis Recti   (Blank rows = not tested)        TONE: Deferred   PROLAPSE: deferred  TODAY'S TREATMENT:                                                                                                                              DATE:  06/11/23  Moist heat at back with x4 layers  between skin and pack during stretches   Nustep 6 mins L3 -heat on during this Rows green band 2x10 Bil shoulder horizontal abduction green band 2x10 Seated same knee/hand press 2x10 each Functional ambulation with cues for midline stance without trunk sway or side lean for improved core activation and strength 250'  PATIENT EDUCATION:  Education details: urge drill, [id number] Person educated: Patient Education method: Explanation, Demonstration, Tactile cues, Verbal cues, and Handouts Education comprehension: verbalized understanding and returned demonstration  HOME EXERCISE PROGRAM: Access Code: [email or web address] URL: [email or web address] Date: 03/20/2023 Prepared by: Clydie Braun Menke Updated 05/21/23 - Quanesha Klimaszewski   Exercises - Seated Pelvic Floor Contraction with Isometric Hip Adduction  - 1 x daily - 7 x weekly - 2 sets - 10 reps - Seated Transversus Abdominis Bracing  - 1 x daily - 7 x weekly - 2 sets - 10 reps - Seated Pelvic Floor Contraction with Hip Abduction and Resistance Loop  - 1 x daily - 7 x weekly - 2 sets - 10 reps - Seated Hamstring Stretch  - 1 x daily - 7 x weekly - 1 sets - 2 reps - 20 sec hold - Sit to Stand  - 1 x daily - 7 x weekly - 2 sets - 5 reps  ASSESSMENT:  CLINICAL IMPRESSION: Pt session focused on posture training and core activation. Pt tolerated well today and reports feeling better. Pt  tolerated more activity, reports with stretching and relaxing techniques pain has been much better since last session. Pt would benefit from additional PT for improved back and hip and core strengthening, posture, gait, standing tolerance and walking tolerance.   OBJECTIVE IMPAIRMENTS: decreased activity tolerance, decreased coordination, decreased endurance, decreased mobility, difficulty walking, decreased strength, increased fascial restrictions, increased muscle spasms, impaired flexibility, improper body mechanics, postural dysfunction, and pain.   ACTIVITY LIMITATIONS: carrying, lifting, bending, sitting, standing, squatting, transfers, and locomotion level  PARTICIPATION LIMITATIONS: driving, shopping, community activity, and yard work  PERSONAL FACTORS: Time since onset of injury/illness/exacerbation and 1 comorbidity: medical history  are also affecting patient's functional outcome.   REHAB POTENTIAL: Good  CLINICAL DECISION MAKING: Stable/uncomplicated  EVALUATION COMPLEXITY: Low   GOALS: Goals reviewed with patient? Yes  SHORT TERM GOALS: Target date: 03/26/23  Pt to be I with HEP.  Baseline: Goal status:MET  2.  Pt to demonstrate at least 3/5 pelvic floor strength for improved pelvic stability and decreased strain at pelvic floor/ decrease leakage.  Baseline:  Goal status: deferred   3.  Pt will have 25% less urgency due to bladder retraining and strengthening  Baseline:  Goal status: MET  4.  Pt to demonstrate at least 4/5 bil hip strength for improved pelvic stability and functional squats without leakage.  Baseline:  Goal status: MET   LONG TERM GOALS: Target date: 05/29/23  Pt to be I with advanced HEP.  Baseline:  Goal status: on going  2.  Pt to demonstrate at least 5/5 bil hip strength for improved pelvic stability and functional squats without leakage.  Baseline:  Goal status: on going  3.  Pt will have 50% less urgency due to bladder retraining and  strengthening  Baseline:  Goal status: on going  4.  Pt to demonstrate improved gait mechanics with improved cadence and posture for at least 250' with LRAD at mod I and min cues for improved mobility at home and for community activity.  Baseline:  Goal status: on going  5.  Pt to complete x10 squats with at least  10# for improved mobility and strength at hips for improved gait and transfers for decreased fall risk.  Baseline:  Goal status: on going   6. PT to demonstrate improved TUG test to no more than 20s with rolling walker for decreased fall risk Baseline:  Goal status: MET - 11s  7. Pt to score at least 25% better on [place] for improved tolerance to standing and activity. Baseline:  Goal status: NEW  8. Pt to report ability to stand with/out LRAD for at least 30 mins with minimal increase/no increase in pain for improved tolerance to working.   Baseline:  Goal status: NEW PLAN:  PT FREQUENCY: 1-2x/week  PT DURATION:  12 sessions  PLANNED INTERVENTIONS: Therapeutic exercises, Therapeutic activity, Neuromuscular re-education, Balance training, Gait training, Patient/Family education, Self Care, Joint mobilization, DME instructions, Aquatic Therapy, Dry Needling, Spinal mobilization, Cryotherapy, Moist heat, scar mobilization, Taping, Biofeedback, and Manual therapy  PLAN FOR NEXT SESSION: Continue hip, core, pelvic floor strengthening, activity tolerance, mobility at spine and hips  Otelia Sergeant, PT, DPT 08/22/241:16 PM  [facility] [address], [address] [place], [place] [address] Phone # [phone] Fax [phone]

## 2023-06-18 ENCOUNTER — Ambulatory Visit: Payer: BC Managed Care – PPO | Admitting: Physical Therapy

## 2023-06-26 ENCOUNTER — Telehealth: Payer: Self-pay | Admitting: Family Medicine

## 2023-06-26 MED ORDER — PREDNISONE 50 MG PO TABS: 50.0000 mg | ORAL_TABLET | Freq: Every day | ORAL | 0 refills | Status: DC

## 2023-06-26 NOTE — Telephone Encounter (Signed)
I called Darlene Griffith back about her pain. She having BL L>R anterior lower leg pain.  Plan for course of prednisone and appt scheduled on Monday 9th.  If worse go to the ER.

## 2023-06-26 NOTE — Telephone Encounter (Signed)
Patient called asking to speak to someone in regards to leg pain that she is having.  She said that in her left leg from the knee down is very painful. She said because of it she is not able to stand on it.  She is very concerned.  Please advise.

## 2023-06-29 ENCOUNTER — Ambulatory Visit (INDEPENDENT_AMBULATORY_CARE_PROVIDER_SITE_OTHER): Payer: BC Managed Care – PPO

## 2023-06-29 ENCOUNTER — Encounter: Payer: Self-pay | Admitting: Family Medicine

## 2023-06-29 ENCOUNTER — Ambulatory Visit (INDEPENDENT_AMBULATORY_CARE_PROVIDER_SITE_OTHER): Payer: BC Managed Care – PPO | Admitting: Family Medicine

## 2023-06-29 ENCOUNTER — Other Ambulatory Visit: Payer: Self-pay

## 2023-06-29 VITALS — BP 130/80 | HR 101 | Ht 61.0 in | Wt 165.0 lb

## 2023-06-29 DIAGNOSIS — M5416 Radiculopathy, lumbar region: Secondary | ICD-10-CM | POA: Diagnosis not present

## 2023-06-29 DIAGNOSIS — M79605 Pain in left leg: Secondary | ICD-10-CM | POA: Diagnosis not present

## 2023-06-29 MED ORDER — VALACYCLOVIR HCL 1 G PO TABS: 1000.0000 mg | ORAL_TABLET | Freq: Three times a day (TID) | ORAL | 0 refills | Status: AC

## 2023-06-29 MED ORDER — PREGABALIN 25 MG PO CAPS
25.0000 mg | ORAL_CAPSULE | Freq: Two times a day (BID) | ORAL | 1 refills | Status: DC | PRN
Start: 2023-06-29 — End: 2023-12-03

## 2023-06-29 MED ORDER — HYDROCODONE-ACETAMINOPHEN 5-325 MG PO TABS
1.0000 | ORAL_TABLET | Freq: Four times a day (QID) | ORAL | 0 refills | Status: DC | PRN
Start: 2023-06-29 — End: 2024-02-09

## 2023-06-29 NOTE — Telephone Encounter (Signed)
Noted  

## 2023-06-29 NOTE — Patient Instructions (Addendum)
Thank you for coming in today.   Please get an Xray today before you leave   Please call [facility] ([facility]) at [phone] to schedule your spine injection.    Take valtrex three times daily for 1 week to help treat possible shingles.  Let me know how this goes.   You have lyrica you can take for nerve pain as needed.   YOu have hydrocodone for severe pain as well.

## 2023-06-29 NOTE — Progress Notes (Signed)
I, Stevenson Clinch, CMA acting as a scribe for [name], MD.  Darlene Griffith is a 72 y.o. female who presents to [facility] today for bilateral leg pain. Pt was previously seen by Dr. Denyse Amass on 05/06/23 for thoracic and low back pain.   Today, pt c/o bilateral leg pain. Pt called the office on Friday w/ this complaint and was prescribed prednisone. Pt locates pain to the lower legs. Right leg sx have improved but left leg sx have worsened. Pain with WB and ambulation. Notes pain distal to patella in left leg. Swelling and erythema present. Feels TTP.   Radiating pain: into B LE LE numbness/tingling:  LE weakness: yes, ambulating with a walker Aggravates: WB Treatments tried: BioFreeze, topical Voltaren  Dx imaging: 01/22/23 Vasc US ABI 01/17/23 L-spine MRI             01/08/23 L-spine XR 12/21/21 L-spine & T-spine MRI 05/28/18 L hip/pelvis XR  Pertinent review of systems: No fevers or chills  Relevant historical information: Intermuscular lipoma proximal left soleus muscle seen on MRI March 2024   Exam:  BP 130/80   Pulse (!) 101   Ht 5\' 1"  (1.549 m)   Wt 165 lb (74.8 kg)   SpO2 95%   BMI 31.18 kg/m  General: Well Developed, well nourished, and in no acute distress.   MSK: Left lower leg slight erythema at anterior proximal tibia extending down the anterior tibia to the distal ankle area.  No definitive vascular rash is present. No significant leg swelling is present. Normal foot and ankle motion.  Normal knee motion. Tender palpation along anterior lower leg. Pulses are intact.   Lab and Radiology Results  Diagnostic Limited MSK Ultrasound of: Left anterior lower leg Slight subcutaneous edema is visible along the area of pain along the anterior tibia Impression: Possible edema   EXAM: MRI LUMBAR SPINE WITHOUT CONTRAST   TECHNIQUE: Multiplanar, multisequence MR imaging of the lumbar spine was performed. No intravenous contrast was  administered.   COMPARISON:  Radiographs 01/08/2023 and MRI 12/21/2021.   FINDINGS: Segmentation: Conventional anatomy assumed, with the last open disc space designated L5-S1.Concordant with prior imaging.   Alignment:  Physiologic.   Vertebrae: No worrisome osseous lesion, acute fracture or pars defect. Stable scattered hemangiomas or fatty deposits.   Conus medullaris: Extends to the L1-2 level and appears normal.   Paraspinal and other soft tissues: No significant paraspinal findings. Grossly stable appearance of known chronic aortic dissection evaluated by CT 01/04/2023.   Disc levels:   Minimal disc bulging at L1-2 and L2-3. No spinal stenosis or nerve root encroachment.   L3-4: Mild disc bulging eccentric to the left with mild facet hypertrophy. Mild inferior foraminal narrowing on the left without definite L3 nerve root encroachment. The spinal canal and lateral recesses are patent.   L4-5: Preserved disc height with minimal disc bulging. Mildly progressive bilateral facet hypertrophy. No significant spinal stenosis or nerve root encroachment.   L5-S1: As numbered, this is a transitional disc space level without acquired abnormality. No spinal stenosis or nerve root encroachment.   IMPRESSION: 1. No acute findings or explanation for the patient's symptoms. 2. Mild disc bulging and facet hypertrophy as described without resulting significant spinal stenosis or nerve root encroachment.     Electronically Signed   By: Carey Bullocks M.D.   On: 01/20/2023 11:10 I, [name], personally (independently) visualized and performed the interpretation of the images attached in this note.  Assessment and Plan: 72 y.o. female with left leg pain.  This is a sudden onset of severe left anterior lower leg pain.  Etiology is unclear.  She does have a abnormality in that leg seen on MRI.  In March she had an MRI of her left lower leg that showed a intermuscular lipoma in  the soleus muscle.  Today's pain is in the anterior leg so I do not think it is relevant.  Additionally she does have a history of vascular disease.  Claudication could explain her symptoms.  However she had a ABI test in April of this year that was normal.  I think today's symptoms are most likely to be due to lumbar radiculopathy left L4.  She has the potential to have nerve impingement at this level based on her most recent MRI lumbar spine. Shingles is a possibility.  She does have a bit of a rash in this area.  The rash is not characteristic for shingles but I could be seen in shingles in its early phase.  Will go ahead and treat with valacyclovir and pregabalin.  Also will prescribe hydrocodone for pain control.     PDMP reviewed during this encounter. Orders Placed This Encounter  Procedures   [place] LIMITED JOINT SPACE STRUCTURES LOW LEFT(NO LINKED CHARGES)    Order Specific Question:   Reason for Exam (SYMPTOM  OR DIAGNOSIS REQUIRED)    Answer:   left leg pain    Order Specific Question:   Preferred imaging location?    Answer:   [facility] Epidural/Nerve Root    Standing Status:   Future    Standing Expiration Date:   06/28/2024    Order Specific Question:   Reason for Exam (SYMPTOM  OR DIAGNOSIS REQUIRED)    Answer:   ESI or NRB or both. Left Leg pain Suspect L4. Level and tachnique per radiology    Order Specific Question:   Preferred imaging location?    Answer:   [facility]   DG Tibia/Fibula Left    Standing Status:   Future    Number of Occurrences:   1    Standing Expiration Date:   06/28/2024    Order Specific Question:   Reason for Exam (SYMPTOM  OR DIAGNOSIS REQUIRED)    Answer:   eval leg pain    Order Specific Question:   Preferred imaging location?    Answer:   [facility]   Meds ordered this encounter  Medications   valACYclovir (VALTREX) 1000 MG tablet    Sig: Take 1 tablet (1,000 mg total) by mouth 3 (three) times  daily.    Dispense:  21 tablet    Refill:  0   pregabalin (LYRICA) 25 MG capsule    Sig: Take 1-3 capsules (25-75 mg total) by mouth 2 (two) times daily as needed (nerve pain).    Dispense:  60 capsule    Refill:  1   HYDROcodone-acetaminophen (NORCO/VICODIN) 5-325 MG tablet    Sig: Take 1 tablet by mouth every 6 (six) hours as needed.    Dispense:  15 tablet    Refill:  0     Discussed warning signs or symptoms. Please see discharge instructions. Patient expresses understanding.   The above documentation has been reviewed and is accurate and complete [name], M.D.

## 2023-07-02 ENCOUNTER — Ambulatory Visit: Payer: BC Managed Care – PPO | Attending: Surgery

## 2023-07-02 DIAGNOSIS — M6281 Muscle weakness (generalized): Secondary | ICD-10-CM | POA: Diagnosis not present

## 2023-07-02 DIAGNOSIS — M62838 Other muscle spasm: Secondary | ICD-10-CM | POA: Insufficient documentation

## 2023-07-02 DIAGNOSIS — R293 Abnormal posture: Secondary | ICD-10-CM | POA: Insufficient documentation

## 2023-07-02 DIAGNOSIS — R252 Cramp and spasm: Secondary | ICD-10-CM | POA: Insufficient documentation

## 2023-07-02 DIAGNOSIS — M545 Low back pain, unspecified: Secondary | ICD-10-CM | POA: Diagnosis not present

## 2023-07-02 DIAGNOSIS — M62830 Muscle spasm of back: Secondary | ICD-10-CM | POA: Insufficient documentation

## 2023-07-02 DIAGNOSIS — M546 Pain in thoracic spine: Secondary | ICD-10-CM | POA: Insufficient documentation

## 2023-07-02 DIAGNOSIS — R262 Difficulty in walking, not elsewhere classified: Secondary | ICD-10-CM | POA: Insufficient documentation

## 2023-07-02 NOTE — Therapy (Signed)
OUTPATIENT PHYSICAL THERAPY FEMALE PELVIC TREATMENT NOTE   Patient Name: Darlene Griffith MRN: [id number] DOB:1951/03/27, 72 y.o., female Today's Date: 07/02/2023  END OF SESSION:  PT End of Session - 07/02/23 1223     Visit Number 9    Date for PT Re-Evaluation 09/04/23    Authorization Type [employer]    Progress Note Due on Visit 17    PT Start Time 1224    PT Stop Time 1247    PT Time Calculation (min) 23 min    Activity Tolerance Patient tolerated treatment well    Behavior During Therapy WFL for tasks assessed/performed                Past Medical History:  Diagnosis Date   Chest pain    a. 01/2015 Echo: Nl LV fxn, Gr 1 DD, triv AI, mild MR.   Essential hypertension    Hepatic cyst    a. noted on CT 01/2015.   Hyperthyroidism    Multinodular goiter    a. 01/2015 CT chest: multinodular goidter w/ substernal extension of the left lobe of the thyroid assoc w/ rightward deviation of tracheal air column.   Neck pain, chronic    Personal history of colonic polyps    Pulmonary nodules    a. 01/2015 CT Chest: RLL ~ 5mm subpleural nodule - rec f/u in 6-12 mos.   Splenic cyst    a. noted on CT 01/2015.   Past Surgical History:  Procedure Laterality Date   BYPASS GRAFT AORTA TO AORTA Left 11/11/2021   Procedure: BYPASS GRAFT AORTA TO LEFT COMMON  FEMORAL ARTERY;  Surgeon: Leonie Douglas, MD;  Location: [facility] OR;  Service: Vascular;  Laterality: Left;   CERVICAL DISC ARTHROPLASTY N/A 07/05/2019   Procedure: Cervical Six-Seven Artificial disc replacement;  Surgeon: Barnett Abu, MD;  Location: [facility] OR;  Service: Neurosurgery;  Laterality: N/A;  Cervical Six-Seven Artificial disc replacement   CESAREAN SECTION     COLONOSCOPY  01/13/2020   THORACIC AORTIC ENDOVASCULAR STENT GRAFT N/A 11/11/2021   Procedure: THORACIC AORTIC ENDOVASCULAR STENT GRAFT WITH LEFT BRACHIAL  ARTERY ACCESS;  Surgeon: Leonie Douglas, MD;  Location: [facility] OR;  Service: Vascular;  Laterality: N/A;   TONSILLECTOMY      AROUND 5-6 YRS OLD   ULTRASOUND GUIDANCE FOR VASCULAR ACCESS Bilateral 11/11/2021   Procedure: ULTRASOUND GUIDANCE FOR VASCULAR ACCESS;  Surgeon: Leonie Douglas, MD;  Location: [facility] OR;  Service: Vascular;  Laterality: Bilateral;   UPPER GASTROINTESTINAL ENDOSCOPY  01/13/2020   Patient Active Problem List   Diagnosis Date Noted   Seasonal allergic rhinitis 12/19/2022   Urge incontinence of urine 12/19/2022   Incisional hernia, without obstruction or gangrene 09/26/2022   COPD (chronic obstructive pulmonary disease) (HCC) 11/08/2021   AKI (acute kidney injury) (HCC)    Aortic dissection (HCC) 11/06/2021   Gastroesophageal reflux disease without esophagitis    Hyperthyroidism 04/19/2021   Snoring 03/02/2020   Tobacco use 03/02/2020   Genetic testing 02/10/2020   Family history of lung cancer    Personal history of colonic polyps    Dysphagia 01/06/2020   Cervical spondylosis with myelopathy and radiculopathy 07/05/2019   Neck pain, chronic 05/21/2019   Insomnia 07/27/2018   Multinodular goiter 07/14/2018   Chronic fatigue 06/15/2018   Midsternal chest pain 02/18/2015   Chest pain    Cough    Essential hypertension    Shortness of breath    Aortic stenosis    Pain in the chest  EKG abnormality    Malaise and fatigue    Lung nodule    Splenic cyst    Hepatic cyst    Hyperlipidemia    Hypertension 02/16/2015    PCP: [place], Betty G, MD  REFERRING PROVIDER: Stechschulte, Hyman Hopes, MD   REFERRING DIAG: [id number] (ICD-10-CM) - Incisional hernia, without obstruction or gangrene  THERAPY DIAG:  Abnormal posture  Muscle weakness (generalized)  Other muscle spasm  Muscle spasm of back  Acute bilateral low back pain without sciatica  Pain in thoracic spine  Cramp and spasm  Difficulty in walking, not elsewhere classified  Rationale for Evaluation and Treatment: Rehabilitation  ONSET DATE: one year - since leaving hospital last year  SUBJECTIVE:                                                                                                                                                                                            SUBJECTIVE STATEMENT: Pain got progressively worse since last week.  She has mass in left leg but provider says this is not cancerous. She has put voltaren and biofreeze on her legs.  "I started to cancel today because I just can't do anything with my legs" "My back is killing me too".  "I'm waiting for a shot".  Patient states she is willing to do what she can today.  We discussed aquatics therapy and she is on board with this.     Water- not a lot but I do like sodas  PAIN:  Are you having pain? Yes NPRS scale: 10/10 Pain location:  low back  Pain type: burning and sharp Pain description: constant   Aggravating factors: standing, sitting, prolonged walking Relieving factors: rest but not too long  PRECAUTIONS: Fall  WEIGHT BEARING RESTRICTIONS: No  FALLS:  Has patient fallen in last 6 months? No  LIVING ENVIRONMENT: Lives with: lives alone   OCCUPATION: healthcare medications shipping  PLOF: Independent  PATIENT GOALS: to have less pain  PERTINENT HISTORY:    HTN, aortic bypass, c-section, chronic fatigue, SOB, Aortic stenosis,takes nitroglycerin for chest pain,  CHF Complications from surgery last year with iliac artery rupture requiring transverse incision in Lt lower quadrant for retroperitoneal exposure of the L iliac vessel for L iliac to common femoral bypass. Developed hernia, consulted for hernia repair, but per pt MD recommended no surgery.   Sexual abuse: No  BOWEL MOVEMENT: Pain with bowel movement: No Type of bowel movement:Type (Bristol Stool Scale) 2-6, Frequency after all meals (within 10 mins), and Strain No Fully empty rectum: Yes:   Leakage: No Pads: No Fiber supplement: No  URINATION: Pain  with urination: No Fully empty bladder: Yes:   Stream: Strong and Weak Urgency: Yes:    Frequency: increased every 30-45 mins, 3-4x night Leakage: Urge to void Pads: Yes: most of the time  INTERCOURSE: Pain with intercourse:  not painful  Ability to have vaginal penetration:  Yes:   Climax: not painful Marinoff Scale: 0/3  PREGNANCY: Vaginal deliveries 3 Tearing Yes: full 4th grade tearing with breech baby C-section deliveries 1 Currently pregnant No  PROLAPSE: None   OBJECTIVE:   DIAGNOSTIC FINDINGS:   Oswestry Score: 27 / 50 or 54 % 06/04/23   f/u lumbar spinal stenosis w/ bilat leg pain, L>R, w/ L-spine MRI  transcatheter aortic valve replacement and bypass grafting  urinary urgency and has she get up to go to the bathroom multiple times a day.  CT 01/04/2023. IMPRESSION: 1. No acute findings or explanation for the patient's symptoms. 2. Mild disc bulging and facet hypertrophy as described without resulting significant spinal stenosis or nerve root encroachment.  COGNITION: Overall cognitive status: Within functional limits for tasks assessed     SENSATION: Light touch: Appears intact Proprioception: Appears intact  MUSCLE LENGTH: Bil hamstrings and adductors limited by 50%  LUMBAR SPECIAL TESTS:  Single leg stance test: 2s Rt 1s lt , SI Compression/distraction test: pain with both, and FABER test: pain bil  TUG:31s with rolling walker  GAIT: Distance walked: 150' Assistive device utilized: Environmental consultant - 4 wheeled Level of assistance: Modified independence Comments: decreased cadence, very forward trunk, decreased stride length, decreased step height bil  POSTURE: rounded shoulders, forward head, anterior pelvic tilt, and flexed trunk    LUMBARAROM/PROM:  A/PROM A/PROM  eval  Flexion Limited  by 75%  Extension Limited  by 50%  Right lateral flexion Limited  by 50%  Left lateral flexion Limited  by 50%  Right rotation Limited  by 75%  Left rotation Limited  by 75%   (Blank rows = not tested)  LOWER EXTREMITY ROM:  WFL LOWER  EXTREMITY MMT:  MMT Right eval Left eval  Hip flexion 4 4  Hip extension 3+ 3+  Hip abduction 3 3  Hip adduction 3+ 3+  Hip internal rotation    Hip external rotation    Knee flexion 4 4  Knee extension 4 4  Ankle dorsiflexion    Ankle plantarflexion    Ankle inversion    Ankle eversion     PALPATION:   General  TTP throughout abdomen, tight thoracic and lumbar spine, TTP from T4-L5 worse at mid thoracic range, TTP at PSIS bil                External Perineal Exam deferred today                             Internal Pelvic Floor deferred today  Patient confirms identification and approves PT to assess internal pelvic floor and treatment No  PELVIC MMT:   MMT eval  Vaginal   Internal Anal Sphincter   External Anal Sphincter   Puborectalis   Diastasis Recti   (Blank rows = not tested)        TONE: Deferred   PROLAPSE: deferred  TODAY'S TREATMENT:  DATE:  07/02/23  Seated toe and heel raises x 20 Long arc quad x 20  March x 20 Started to initiate next exercise but patients transportation came and she felt she needed to leave so that she didn't miss her ride.     DATE:  06/11/23  Moist heat at back with x4 layers between skin and pack during stretches   Nustep 6 mins L3 -heat on during this Rows green band 2x10 Bil shoulder horizontal abduction green band 2x10 Seated same knee/hand press 2x10 each Functional ambulation with cues for midline stance without trunk sway or side lean for improved core activation and strength 250'  PATIENT EDUCATION:  Education details: urge drill, [id number] Person educated: Patient Education method: Explanation, Demonstration, Tactile cues, Verbal cues, and Handouts Education comprehension: verbalized understanding and returned demonstration  HOME EXERCISE PROGRAM: Access Code: [email or web address] URL:  [email or web address] Date: 03/20/2023 Prepared by: Clydie Braun Menke Updated 05/21/23 - HALEY   Exercises - Seated Pelvic Floor Contraction with Isometric Hip Adduction  - 1 x daily - 7 x weekly - 2 sets - 10 reps - Seated Transversus Abdominis Bracing  - 1 x daily - 7 x weekly - 2 sets - 10 reps - Seated Pelvic Floor Contraction with Hip Abduction and Resistance Loop  - 1 x daily - 7 x weekly - 2 sets - 10 reps - Seated Hamstring Stretch  - 1 x daily - 7 x weekly - 1 sets - 2 reps - 20 sec hold - Sit to Stand  - 1 x daily - 7 x weekly - 2 sets - 5 reps  ASSESSMENT:  CLINICAL IMPRESSION: Pt session cut short due to pain and transportation issues.  She was on board with trying aquatics therapy and several appointments were transferred to [employer].  She was doing fairly well prior to todays session but is reporting a sudden onset of severe leg pain.  She will likely be more appropriate for aquatics due to her low tolerance to land exercise.  We will slowly transition to pool program.    OBJECTIVE IMPAIRMENTS: decreased activity tolerance, decreased coordination, decreased endurance, decreased mobility, difficulty walking, decreased strength, increased fascial restrictions, increased muscle spasms, impaired flexibility, improper body mechanics, postural dysfunction, and pain.   ACTIVITY LIMITATIONS: carrying, lifting, bending, sitting, standing, squatting, transfers, and locomotion level  PARTICIPATION LIMITATIONS: driving, shopping, community activity, and yard work  PERSONAL FACTORS: Time since onset of injury/illness/exacerbation and 1 comorbidity: medical history  are also affecting patient's functional outcome.   REHAB POTENTIAL: Good  CLINICAL DECISION MAKING: Stable/uncomplicated  EVALUATION COMPLEXITY: Low   GOALS: Goals reviewed with patient? Yes  SHORT TERM GOALS: Target date: 03/26/23  Pt to be I with HEP.  Baseline: Goal status:MET  2.  Pt to  demonstrate at least 3/5 pelvic floor strength for improved pelvic stability and decreased strain at pelvic floor/ decrease leakage.  Baseline:  Goal status: deferred   3.  Pt will have 25% less urgency due to bladder retraining and strengthening  Baseline:  Goal status: MET  4.  Pt to demonstrate at least 4/5 bil hip strength for improved pelvic stability and functional squats without leakage.  Baseline:  Goal status: MET   LONG TERM GOALS: Target date: 05/29/23  Pt to be I with advanced HEP.  Baseline:  Goal status: on going  2.  Pt to demonstrate at least 5/5 bil hip strength for improved pelvic stability and functional squats without leakage.  Baseline:  Goal status: on going  3.  Pt will have 50% less urgency due to bladder retraining and strengthening  Baseline:  Goal status: on going  4.  Pt to demonstrate improved gait mechanics with improved cadence and posture for at least 250' with LRAD at mod I and min cues for improved mobility at home and for community activity.  Baseline:  Goal status: on going  5.  Pt to complete x10 squats with at least 10# for improved mobility and strength at hips for improved gait and transfers for decreased fall risk.  Baseline:  Goal status: on going   6. PT to demonstrate improved TUG test to no more than 20s with rolling walker for decreased fall risk Baseline:  Goal status: MET - 11s  7. Pt to score at least 25% better on [place] for improved tolerance to standing and activity. Baseline:  Goal status: NEW  8. Pt to report ability to stand with/out LRAD for at least 30 mins with minimal increase/no increase in pain for improved tolerance to working.   Baseline:  Goal status: NEW PLAN:  PT FREQUENCY: 1-2x/week  PT DURATION:  12 sessions  PLANNED INTERVENTIONS: Therapeutic exercises, Therapeutic activity, Neuromuscular re-education, Balance training, Gait training, Patient/Family education, Self Care, Joint mobilization, DME  instructions, Aquatic Therapy, Dry Needling, Spinal mobilization, Cryotherapy, Moist heat, scar mobilization, Taping, Biofeedback, and Manual therapy  PLAN FOR NEXT SESSION: Continue hip, core, pelvic floor strengthening, activity tolerance, mobility at spine and hips  Vonzell Lindblad B. Tawona Filsinger, PT 07/02/23 12:55 PM [facility] [address], [address] [place], [place] [address] Phone # [phone] Fax [phone]

## 2023-07-08 NOTE — Discharge Instructions (Signed)

## 2023-07-09 ENCOUNTER — Ambulatory Visit: Payer: BC Managed Care – PPO

## 2023-07-09 ENCOUNTER — Other Ambulatory Visit: Payer: Self-pay | Admitting: Family Medicine

## 2023-07-09 ENCOUNTER — Ambulatory Visit
Admission: RE | Admit: 2023-07-09 | Discharge: 2023-07-09 | Disposition: A | Discharge status: Home / Self Care | Payer: BC Managed Care – PPO | Source: Ambulatory Visit | Attending: Family Medicine | Admitting: Family Medicine

## 2023-07-09 DIAGNOSIS — M79605 Pain in left leg: Secondary | ICD-10-CM

## 2023-07-09 DIAGNOSIS — M5416 Radiculopathy, lumbar region: Secondary | ICD-10-CM

## 2023-07-09 MED ORDER — IOPAMIDOL (ISOVUE-M 200) INJECTION 41%
1.0000 mL | Freq: Once | INTRAMUSCULAR | Status: AC
  Administered 2023-07-09: 1 mL via EPIDURAL

## 2023-07-09 MED ORDER — METHYLPREDNISOLONE ACETATE 40 MG/ML INJ SUSP (RADIOLOG
80.0000 mg | Freq: Once | INTRAMUSCULAR | Status: AC
  Administered 2023-07-09: 80 mg via EPIDURAL

## 2023-07-10 ENCOUNTER — Telehealth: Payer: Self-pay | Admitting: Family Medicine

## 2023-07-10 MED ORDER — PREDNISONE 50 MG PO TABS: 50.0000 mg | ORAL_TABLET | Freq: Every day | ORAL | 0 refills | Status: DC

## 2023-07-10 NOTE — Telephone Encounter (Signed)
Patient called stating that she is leaving for a trip tomorrow and asked if Dr Denyse Amass would be able to send in a few Prednisone for her to have just in case she needs it on her trip?  Please advise.

## 2023-07-10 NOTE — Progress Notes (Signed)
X-ray lower leg looks okay.  They can see the faint outline of the lipoma in the calf muscle that we saw on MRI.

## 2023-07-10 NOTE — Telephone Encounter (Signed)
Sent to [facility] [address]

## 2023-07-10 NOTE — Telephone Encounter (Signed)
Called pt and spoke to her daughter, notifying the rx was sent to her pharmacy.

## 2023-07-23 ENCOUNTER — Encounter (HOSPITAL_BASED_OUTPATIENT_CLINIC_OR_DEPARTMENT_OTHER): Payer: Self-pay | Admitting: Physical Therapy

## 2023-07-23 ENCOUNTER — Ambulatory Visit (HOSPITAL_BASED_OUTPATIENT_CLINIC_OR_DEPARTMENT_OTHER): Payer: BC Managed Care – PPO | Attending: Family Medicine | Admitting: Physical Therapy

## 2023-07-23 DIAGNOSIS — M62838 Other muscle spasm: Secondary | ICD-10-CM

## 2023-07-23 DIAGNOSIS — M6281 Muscle weakness (generalized): Secondary | ICD-10-CM | POA: Diagnosis not present

## 2023-07-23 DIAGNOSIS — K432 Incisional hernia without obstruction or gangrene: Secondary | ICD-10-CM | POA: Insufficient documentation

## 2023-07-23 DIAGNOSIS — R293 Abnormal posture: Secondary | ICD-10-CM

## 2023-07-23 DIAGNOSIS — M62830 Muscle spasm of back: Secondary | ICD-10-CM

## 2023-07-23 NOTE — Therapy (Signed)
OUTPATIENT PHYSICAL THERAPY FEMALE PELVIC TREATMENT NOTE   Patient Name: Darlene Griffith MRN: [id number] DOB:01-06-1951, 72 y.o., female Today's Date: 07/23/2023  END OF SESSION:  PT End of Session - 07/23/23 1209     Visit Number 10    Date for PT Re-Evaluation 09/04/23    Authorization Type [employer]    Progress Note Due on Visit 17    PT Start Time 1201    PT Stop Time 1240    PT Time Calculation (min) 39 min    Activity Tolerance Patient tolerated treatment well    Behavior During Therapy WFL for tasks assessed/performed                Past Medical History:  Diagnosis Date   Chest pain    a. 01/2015 Echo: Nl LV fxn, Gr 1 DD, triv AI, mild MR.   Essential hypertension    Hepatic cyst    a. noted on CT 01/2015.   Hyperthyroidism    Multinodular goiter    a. 01/2015 CT chest: multinodular goidter w/ substernal extension of the left lobe of the thyroid assoc w/ rightward deviation of tracheal air column.   Neck pain, chronic    Personal history of colonic polyps    Pulmonary nodules    a. 01/2015 CT Chest: RLL ~ 5mm subpleural nodule - rec f/u in 6-12 mos.   Splenic cyst    a. noted on CT 01/2015.   Past Surgical History:  Procedure Laterality Date   BYPASS GRAFT AORTA TO AORTA Left 11/11/2021   Procedure: BYPASS GRAFT AORTA TO LEFT COMMON  FEMORAL ARTERY;  Surgeon: Leonie Douglas, MD;  Location: [facility] OR;  Service: Vascular;  Laterality: Left;   CERVICAL DISC ARTHROPLASTY N/A 07/05/2019   Procedure: Cervical Six-Seven Artificial disc replacement;  Surgeon: Barnett Abu, MD;  Location: [facility] OR;  Service: Neurosurgery;  Laterality: N/A;  Cervical Six-Seven Artificial disc replacement   CESAREAN SECTION     COLONOSCOPY  01/13/2020   THORACIC AORTIC ENDOVASCULAR STENT GRAFT N/A 11/11/2021   Procedure: THORACIC AORTIC ENDOVASCULAR STENT GRAFT WITH LEFT BRACHIAL  ARTERY ACCESS;  Surgeon: Leonie Douglas, MD;  Location: [facility] OR;  Service: Vascular;  Laterality: N/A;   TONSILLECTOMY      AROUND 5-6 YRS OLD   ULTRASOUND GUIDANCE FOR VASCULAR ACCESS Bilateral 11/11/2021   Procedure: ULTRASOUND GUIDANCE FOR VASCULAR ACCESS;  Surgeon: Leonie Douglas, MD;  Location: [facility] OR;  Service: Vascular;  Laterality: Bilateral;   UPPER GASTROINTESTINAL ENDOSCOPY  01/13/2020   Patient Active Problem List   Diagnosis Date Noted   Seasonal allergic rhinitis 12/19/2022   Urge incontinence of urine 12/19/2022   Incisional hernia, without obstruction or gangrene 09/26/2022   COPD (chronic obstructive pulmonary disease) (HCC) 11/08/2021   AKI (acute kidney injury) (HCC)    Aortic dissection (HCC) 11/06/2021   Gastroesophageal reflux disease without esophagitis    Hyperthyroidism 04/19/2021   Snoring 03/02/2020   Tobacco use 03/02/2020   Genetic testing 02/10/2020   Family history of lung cancer    History of colonic polyps    Dysphagia 01/06/2020   Cervical spondylosis with myelopathy and radiculopathy 07/05/2019   Neck pain, chronic 05/21/2019   Insomnia 07/27/2018   Multinodular goiter 07/14/2018   Chronic fatigue 06/15/2018   Midsternal chest pain 02/18/2015   Chest pain    Cough    Essential hypertension    Shortness of breath    Aortic stenosis    Pain in the chest  EKG abnormality    Malaise and fatigue    Lung nodule    Splenic cyst    Hepatic cyst    Hyperlipidemia    Hypertension 02/16/2015    PCP: [place], Betty G, MD  REFERRING PROVIDER: Stechschulte, Hyman Hopes, MD   REFERRING DIAG: [phone] (ICD-10-CM) - Incisional hernia, without obstruction or gangrene  THERAPY DIAG:  Abnormal posture  Muscle weakness (generalized)  Other muscle spasm  Muscle spasm of back  Rationale for Evaluation and Treatment: Rehabilitation  ONSET DATE: one year - since leaving hospital last year  SUBJECTIVE:                                                                                                                                                                                            SUBJECTIVE STATEMENT: Pt reports that she has been doing her exercises, but hasn't noticed any difference in strength or pain level.    PAIN:  Are you having pain? Yes NPRS scale: 8/10 Pain location:  low back  Pain type: burning and sharp Pain description: constant   Aggravating factors: standing, sitting, prolonged walking Relieving factors: rest but not too long  PRECAUTIONS: Fall  WEIGHT BEARING RESTRICTIONS: No  FALLS:  Has patient fallen in last 6 months? No  LIVING ENVIRONMENT: Lives with: lives alone   OCCUPATION: healthcare medications shipping  PLOF: Independent  PATIENT GOALS: to have less pain  PERTINENT HISTORY:    HTN, aortic bypass, c-section, chronic fatigue, SOB, Aortic stenosis,takes nitroglycerin for chest pain,  CHF Complications from surgery last year with iliac artery rupture requiring transverse incision in Lt lower quadrant for retroperitoneal exposure of the L iliac vessel for L iliac to common femoral bypass. Developed hernia, consulted for hernia repair, but per pt MD recommended no surgery.   Sexual abuse: No  BOWEL MOVEMENT: Pain with bowel movement: No Type of bowel movement:Type (Bristol Stool Scale) 2-6, Frequency after all meals (within 10 mins), and Strain No Fully empty rectum: Yes:   Leakage: No Pads: No Fiber supplement: No  URINATION: Pain with urination: No Fully empty bladder: Yes:   Stream: Strong and Weak Urgency: Yes:   Frequency: increased every 30-45 mins, 3-4x night Leakage: Urge to void Pads: Yes: most of the time  INTERCOURSE: Pain with intercourse:  not painful  Ability to have vaginal penetration:  Yes:   Climax: not painful Marinoff Scale: 0/3  PREGNANCY: Vaginal deliveries 3 Tearing Yes: full 4th grade tearing with breech baby C-section deliveries 1 Currently pregnant No  PROLAPSE: None   OBJECTIVE:   DIAGNOSTIC FINDINGS:   Oswestry Score: 27 / 50 or 54 %  06/04/23   f/u  lumbar spinal stenosis w/ bilat leg pain, L>R, w/ L-spine MRI  transcatheter aortic valve replacement and bypass grafting  urinary urgency and has she get up to go to the bathroom multiple times a day.  CT 01/04/2023. IMPRESSION: 1. No acute findings or explanation for the patient's symptoms. 2. Mild disc bulging and facet hypertrophy as described without resulting significant spinal stenosis or nerve root encroachment.  COGNITION: Overall cognitive status: Within functional limits for tasks assessed     SENSATION: Light touch: Appears intact Proprioception: Appears intact  MUSCLE LENGTH: Bil hamstrings and adductors limited by 50%  LUMBAR SPECIAL TESTS:  Single leg stance test: 2s Rt 1s lt , SI Compression/distraction test: pain with both, and FABER test: pain bil  TUG:31s with rolling walker  GAIT: Distance walked: 150' Assistive device utilized: Environmental consultant - 4 wheeled Level of assistance: Modified independence Comments: decreased cadence, very forward trunk, decreased stride length, decreased step height bil  POSTURE: rounded shoulders, forward head, anterior pelvic tilt, and flexed trunk    LUMBARAROM/PROM:  A/PROM A/PROM  eval  Flexion Limited  by 75%  Extension Limited  by 50%  Right lateral flexion Limited  by 50%  Left lateral flexion Limited  by 50%  Right rotation Limited  by 75%  Left rotation Limited  by 75%   (Blank rows = not tested)  LOWER EXTREMITY ROM:  WFL LOWER EXTREMITY MMT:  MMT Right eval Left eval  Hip flexion 4 4  Hip extension 3+ 3+  Hip abduction 3 3  Hip adduction 3+ 3+  Hip internal rotation    Hip external rotation    Knee flexion 4 4  Knee extension 4 4  Ankle dorsiflexion    Ankle plantarflexion    Ankle inversion    Ankle eversion     PALPATION:   General  TTP throughout abdomen, tight thoracic and lumbar spine, TTP from T4-L5 worse at mid thoracic range, TTP at PSIS bil                External Perineal Exam deferred  today                             Internal Pelvic Floor deferred today  Patient confirms identification and approves PT to assess internal pelvic floor and treatment No  PELVIC MMT:   MMT eval  Vaginal   Internal Anal Sphincter   External Anal Sphincter   Puborectalis   Diastasis Recti   (Blank rows = not tested)        TONE: Deferred   PROLAPSE: deferred  TODAY'S TREATMENT:                                                                                                                              07/23/23 Pt seen for aquatic therapy today.  Treatment took place in water 3.5-4.75 ft in depth  at the [facility] pool. Temp of water was 91.  Pt entered/exited the pool via stairs independently with bilat rail.  -submerged to chest deep water *introduction to aquatic therapy principles and properties * UE on barbell:  walking forward/backward, multiple laps with cues for increased step length and to widen BOS;  side stepping R/L  * relaxed squat for rest and recovery * holding onto wall: heel raises x 10; alternating hip ext x 10; alternating hip abdct/ addct x 10 * high knee marching forward and backward with row motion of arms - 3 laps   Pt requires the buoyancy and hydrostatic pressure of water for support, and to offload joints by unweighting joint load by at least 50 % in navel deep water and by at least 75-80% in chest to neck deep water.  Viscosity of the water is needed for resistance of strengthening. Water current perturbations provides challenge to standing balance requiring increased core activation.    DATE:  07/02/23  Seated toe and heel raises x 20 Long arc quad x 20  March x 20 Started to initiate next exercise but patients transportation came and she felt she needed to leave so that she didn't miss her ride.     DATE:  06/11/23  Moist heat at back with x4 layers between skin and pack during stretches   Nustep 6 mins L3 -heat on during this Rows  green band 2x10 Bil shoulder horizontal abduction green band 2x10 Seated same knee/hand press 2x10 each Functional ambulation with cues for midline stance without trunk sway or side lean for improved core activation and strength 250'  PATIENT EDUCATION:  Education details: urge drill, [id number] Person educated: Patient Education method: Explanation, Demonstration, Tactile cues, Verbal cues, and Handouts Education comprehension: verbalized understanding and returned demonstration  HOME EXERCISE PROGRAM: Access Code: [email or web address] URL: [email or web address] Date: 03/20/2023 Prepared by: Clydie Braun Menke Updated 05/21/23 - HALEY   Exercises - Seated Pelvic Floor Contraction with Isometric Hip Adduction  - 1 x daily - 7 x weekly - 2 sets - 10 reps - Seated Transversus Abdominis Bracing  - 1 x daily - 7 x weekly - 2 sets - 10 reps - Seated Pelvic Floor Contraction with Hip Abduction and Resistance Loop  - 1 x daily - 7 x weekly - 2 sets - 10 reps - Seated Hamstring Stretch  - 1 x daily - 7 x weekly - 1 sets - 2 reps - 20 sec hold - Sit to Stand  - 1 x daily - 7 x weekly - 2 sets - 5 reps  ASSESSMENT:  CLINICAL IMPRESSION: Pt does not know how to swim, but is confident in aquatic environment and able to take direction from therapist on deck.  She reported gradual reduction in type of pain she had, even though it stayed elevated.  Encouraged pt to find out if she has silver sneakers with MCR and to start exploring area pools to find out which one she would go to at d/c.  She tolerated aquatic exercises well.  Pt has partially met her goals.    OBJECTIVE IMPAIRMENTS: decreased activity tolerance, decreased coordination, decreased endurance, decreased mobility, difficulty walking, decreased strength, increased fascial restrictions, increased muscle spasms, impaired flexibility, improper body mechanics, postural dysfunction, and pain.   ACTIVITY LIMITATIONS: carrying, lifting, bending,  sitting, standing, squatting, transfers, and locomotion level  PARTICIPATION LIMITATIONS: driving, shopping, community activity, and yard work  PERSONAL FACTORS: Time since onset of injury/illness/exacerbation and 1 comorbidity: medical history  are  also affecting patient's functional outcome.   REHAB POTENTIAL: Good  CLINICAL DECISION MAKING: Stable/uncomplicated  EVALUATION COMPLEXITY: Low   GOALS: Goals reviewed with patient? Yes  SHORT TERM GOALS: Target date: 03/26/23  Pt to be I with HEP.  Baseline: Goal status:MET  2.  Pt to demonstrate at least 3/5 pelvic floor strength for improved pelvic stability and decreased strain at pelvic floor/ decrease leakage.  Baseline:  Goal status: deferred   3.  Pt will have 25% less urgency due to bladder retraining and strengthening  Baseline:  Goal status: MET  4.  Pt to demonstrate at least 4/5 bil hip strength for improved pelvic stability and functional squats without leakage.  Baseline:  Goal status: MET   LONG TERM GOALS: Target date: 09/04/23  Pt to be I with advanced HEP.  Baseline:  Goal status: on going  2.  Pt to demonstrate at least 5/5 bil hip strength for improved pelvic stability and functional squats without leakage.  Baseline:  Goal status: on going  3.  Pt will have 50% less urgency due to bladder retraining and strengthening  Baseline:  Goal status: on going  4.  Pt to demonstrate improved gait mechanics with improved cadence and posture for at least 250' with LRAD at mod I and min cues for improved mobility at home and for community activity.  Baseline:  Goal status: on going  5.  Pt to complete x10 squats with at least 10# for improved mobility and strength at hips for improved gait and transfers for decreased fall risk.  Baseline:  Goal status: on going   6. PT to demonstrate improved TUG test to no more than 20s with rolling walker for decreased fall risk Baseline:  Goal status: MET - 11s  7.  Pt to score at least 25% better on [place] for improved tolerance to standing and activity. Baseline:  Goal status: NEW  8. Pt to report ability to stand with/out LRAD for at least 30 mins with minimal increase/no increase in pain for improved tolerance to working.   Baseline:  Goal status: NEW PLAN:  PT FREQUENCY: 1-2x/week  PT DURATION:  12 sessions  PLANNED INTERVENTIONS: Therapeutic exercises, Therapeutic activity, Neuromuscular re-education, Balance training, Gait training, Patient/Family education, Self Care, Joint mobilization, DME instructions, Aquatic Therapy, Dry Needling, Spinal mobilization, Cryotherapy, Moist heat, scar mobilization, Taping, Biofeedback, and Manual therapy  PLAN FOR NEXT SESSION: Continue hip, core, pelvic floor strengthening, activity tolerance, mobility at spine and hips   Mayer Camel, PTA 07/23/23 4:45 PM [facility] [address] [place], [place], [address] Phone: [phone]   Fax:  [phone]

## 2023-07-24 ENCOUNTER — Telehealth: Payer: Self-pay

## 2023-07-24 NOTE — Telephone Encounter (Signed)
Workplace accomodation request form.  Called and requested extension through 07/30/23, as pt is scheduled for f/u 07/29/23.

## 2023-07-29 NOTE — Telephone Encounter (Signed)
Form completed and placed on Dr. Corey's desk to review and sign.  

## 2023-07-30 ENCOUNTER — Ambulatory Visit (HOSPITAL_BASED_OUTPATIENT_CLINIC_OR_DEPARTMENT_OTHER): Payer: BC Managed Care – PPO | Admitting: Physical Therapy

## 2023-07-30 ENCOUNTER — Encounter (HOSPITAL_BASED_OUTPATIENT_CLINIC_OR_DEPARTMENT_OTHER): Payer: Self-pay | Admitting: Physical Therapy

## 2023-07-30 ENCOUNTER — Other Ambulatory Visit (HOSPITAL_BASED_OUTPATIENT_CLINIC_OR_DEPARTMENT_OTHER): Payer: Self-pay

## 2023-07-30 DIAGNOSIS — K432 Incisional hernia without obstruction or gangrene: Secondary | ICD-10-CM | POA: Diagnosis not present

## 2023-07-30 DIAGNOSIS — R293 Abnormal posture: Secondary | ICD-10-CM

## 2023-07-30 DIAGNOSIS — M62830 Muscle spasm of back: Secondary | ICD-10-CM

## 2023-07-30 DIAGNOSIS — M62838 Other muscle spasm: Secondary | ICD-10-CM

## 2023-07-30 DIAGNOSIS — M6281 Muscle weakness (generalized): Secondary | ICD-10-CM

## 2023-07-30 MED ORDER — COMIRNATY 30 MCG/0.3ML IM SUSY
PREFILLED_SYRINGE | INTRAMUSCULAR | 0 refills | Status: AC
  Filled 2023-07-30: qty 0.3, 1d supply, fill #0

## 2023-07-30 NOTE — Telephone Encounter (Signed)
Form reviewed and signed by Dr. Denyse Amass and placed at the front desk for faxing/scanning.

## 2023-07-30 NOTE — Therapy (Signed)
OUTPATIENT PHYSICAL THERAPY FEMALE PELVIC TREATMENT NOTE   Patient Name: Darlene Griffith MRN: [id number] DOB:April 28, 1951, 72 y.o., female Today's Date: 07/30/2023  END OF SESSION:  PT End of Session - 07/30/23 1407     Visit Number 11    Date for PT Re-Evaluation 09/04/23    Authorization Type [employer]    Progress Note Due on Visit 17    PT Start Time 1400    PT Stop Time 1440    PT Time Calculation (min) 40 min    Activity Tolerance Patient limited by pain    Behavior During Therapy [facility] for tasks assessed/performed                Past Medical History:  Diagnosis Date   Chest pain    a. 01/2015 Echo: Nl LV fxn, Gr 1 DD, triv AI, mild MR.   Essential hypertension    Hepatic cyst    a. noted on CT 01/2015.   Hyperthyroidism    Multinodular goiter    a. 01/2015 CT chest: multinodular goidter w/ substernal extension of the left lobe of the thyroid assoc w/ rightward deviation of tracheal air column.   Neck pain, chronic    Personal history of colonic polyps    Pulmonary nodules    a. 01/2015 CT Chest: RLL ~ 5mm subpleural nodule - rec f/u in 6-12 mos.   Splenic cyst    a. noted on CT 01/2015.   Past Surgical History:  Procedure Laterality Date   BYPASS GRAFT AORTA TO AORTA Left 11/11/2021   Procedure: BYPASS GRAFT AORTA TO LEFT COMMON  FEMORAL ARTERY;  Surgeon: Leonie Douglas, MD;  Location: [facility] OR;  Service: Vascular;  Laterality: Left;   CERVICAL DISC ARTHROPLASTY N/A 07/05/2019   Procedure: Cervical Six-Seven Artificial disc replacement;  Surgeon: Barnett Abu, MD;  Location: [facility] OR;  Service: Neurosurgery;  Laterality: N/A;  Cervical Six-Seven Artificial disc replacement   CESAREAN SECTION     COLONOSCOPY  01/13/2020   THORACIC AORTIC ENDOVASCULAR STENT GRAFT N/A 11/11/2021   Procedure: THORACIC AORTIC ENDOVASCULAR STENT GRAFT WITH LEFT BRACHIAL  ARTERY ACCESS;  Surgeon: Leonie Douglas, MD;  Location: [facility] OR;  Service: Vascular;  Laterality: N/A;   TONSILLECTOMY      AROUND 5-6 YRS OLD   ULTRASOUND GUIDANCE FOR VASCULAR ACCESS Bilateral 11/11/2021   Procedure: ULTRASOUND GUIDANCE FOR VASCULAR ACCESS;  Surgeon: Leonie Douglas, MD;  Location: [facility] OR;  Service: Vascular;  Laterality: Bilateral;   UPPER GASTROINTESTINAL ENDOSCOPY  01/13/2020   Patient Active Problem List   Diagnosis Date Noted   Seasonal allergic rhinitis 12/19/2022   Urge incontinence of urine 12/19/2022   Incisional hernia, without obstruction or gangrene 09/26/2022   COPD (chronic obstructive pulmonary disease) (HCC) 11/08/2021   AKI (acute kidney injury) (HCC)    Aortic dissection (HCC) 11/06/2021   Gastroesophageal reflux disease without esophagitis    Hyperthyroidism 04/19/2021   Snoring 03/02/2020   Tobacco use 03/02/2020   Genetic testing 02/10/2020   Family history of lung cancer    History of colonic polyps    Dysphagia 01/06/2020   Cervical spondylosis with myelopathy and radiculopathy 07/05/2019   Neck pain, chronic 05/21/2019   Insomnia 07/27/2018   Multinodular goiter 07/14/2018   Chronic fatigue 06/15/2018   Midsternal chest pain 02/18/2015   Chest pain    Cough    Essential hypertension    Shortness of breath    Aortic stenosis    Pain in the chest  EKG abnormality    Malaise and fatigue    Lung nodule    Splenic cyst    Hepatic cyst    Hyperlipidemia    Hypertension 02/16/2015    PCP: [place], Betty G, MD  REFERRING PROVIDER: Stechschulte, Hyman Hopes, MD   REFERRING DIAG: [phone] (ICD-10-CM) - Incisional hernia, without obstruction or gangrene  THERAPY DIAG:  Abnormal posture  Muscle weakness (generalized)  Other muscle spasm  Muscle spasm of back  Rationale for Evaluation and Treatment: Rehabilitation  ONSET DATE: one year - since leaving hospital last year  SUBJECTIVE:                                                                                                                                                                                            SUBJECTIVE STATEMENT: Pt reports that she felt pretty good after aquatic therapy session.  Pain has been elevated since Saturday (2days after last session).    PAIN:  Are you having pain? Yes NPRS scale: 10/10 Pain location:  low back  Pain type: burning and sharp Pain description: constant   Aggravating factors: standing, sitting, prolonged walking Relieving factors: rest but not too long  PRECAUTIONS: Fall  WEIGHT BEARING RESTRICTIONS: No  FALLS:  Has patient fallen in last 6 months? No  LIVING ENVIRONMENT: Lives with: lives alone   OCCUPATION: healthcare medications shipping  PLOF: Independent  PATIENT GOALS: to have less pain  PERTINENT HISTORY:    HTN, aortic bypass, c-section, chronic fatigue, SOB, Aortic stenosis,takes nitroglycerin for chest pain,  CHF Complications from surgery last year with iliac artery rupture requiring transverse incision in Lt lower quadrant for retroperitoneal exposure of the L iliac vessel for L iliac to common femoral bypass. Developed hernia, consulted for hernia repair, but per pt MD recommended no surgery.   Sexual abuse: No  BOWEL MOVEMENT: Pain with bowel movement: No Type of bowel movement:Type (Bristol Stool Scale) 2-6, Frequency after all meals (within 10 mins), and Strain No Fully empty rectum: Yes:   Leakage: No Pads: No Fiber supplement: No  URINATION: Pain with urination: No Fully empty bladder: Yes:   Stream: Strong and Weak Urgency: Yes:   Frequency: increased every 30-45 mins, 3-4x night Leakage: Urge to void Pads: Yes: most of the time  INTERCOURSE: Pain with intercourse:  not painful  Ability to have vaginal penetration:  Yes:   Climax: not painful Marinoff Scale: 0/3  PREGNANCY: Vaginal deliveries 3 Tearing Yes: full 4th grade tearing with breech baby C-section deliveries 1 Currently pregnant No  PROLAPSE: None   OBJECTIVE:   DIAGNOSTIC FINDINGS:   Oswestry Score: 27 /  50 or 54 %  06/04/23   f/u lumbar spinal stenosis w/ bilat leg pain, L>R, w/ L-spine MRI  transcatheter aortic valve replacement and bypass grafting  urinary urgency and has she get up to go to the bathroom multiple times a day.  CT 01/04/2023. IMPRESSION: 1. No acute findings or explanation for the patient's symptoms. 2. Mild disc bulging and facet hypertrophy as described without resulting significant spinal stenosis or nerve root encroachment.  COGNITION: Overall cognitive status: Within functional limits for tasks assessed     SENSATION: Light touch: Appears intact Proprioception: Appears intact  MUSCLE LENGTH: Bil hamstrings and adductors limited by 50%  LUMBAR SPECIAL TESTS:  Single leg stance test: 2s Rt 1s lt , SI Compression/distraction test: pain with both, and FABER test: pain bil  TUG:31s with rolling walker  GAIT: Distance walked: 150' Assistive device utilized: Environmental consultant - 4 wheeled Level of assistance: Modified independence Comments: decreased cadence, very forward trunk, decreased stride length, decreased step height bil  POSTURE: rounded shoulders, forward head, anterior pelvic tilt, and flexed trunk    LUMBARAROM/PROM:  A/PROM A/PROM  eval  Flexion Limited  by 75%  Extension Limited  by 50%  Right lateral flexion Limited  by 50%  Left lateral flexion Limited  by 50%  Right rotation Limited  by 75%  Left rotation Limited  by 75%   (Blank rows = not tested)  LOWER EXTREMITY ROM:  WFL LOWER EXTREMITY MMT:  MMT Right eval Left eval  Hip flexion 4 4  Hip extension 3+ 3+  Hip abduction 3 3  Hip adduction 3+ 3+  Hip internal rotation    Hip external rotation    Knee flexion 4 4  Knee extension 4 4  Ankle dorsiflexion    Ankle plantarflexion    Ankle inversion    Ankle eversion     PALPATION:   General  TTP throughout abdomen, tight thoracic and lumbar spine, TTP from T4-L5 worse at mid thoracic range, TTP at PSIS bil                External Perineal  Exam deferred today                             Internal Pelvic Floor deferred today  Patient confirms identification and approves PT to assess internal pelvic floor and treatment No  PELVIC MMT:   MMT eval  Vaginal   Internal Anal Sphincter   External Anal Sphincter   Puborectalis   Diastasis Recti   (Blank rows = not tested)        TONE: Deferred   PROLAPSE: deferred  TODAY'S TREATMENT:                                                                                                                              07/30/23 Pt seen for aquatic therapy today.  Treatment took place in water  3.5-4.75 ft in depth at the [facility] pool. Temp of water was 91.  Pt entered/exited the pool via stairs independently with bilat rail.  * UE on barbell:  walking forward/backward,  * relaxed squat for rest and recovery * UE  on barbell: return to walking forward/ backward x 2, then side stepping R/L x 2 * seated in lift chair:  alternating LAQ x 10; cycling legs (painful on left back); hip abdct/ addct;  TrA set with short hollow noodle pull down to legs x 15 * holding onto wall: heel raises x 10; alternating hip ext x 10; alternating hip abdct/ addct x 10 * solid noodle under arms -> back float for spine decompression x 1 min, x 2 - min A to return to standing with cues for head forward and knees flexed  Pt requires the buoyancy and hydrostatic pressure of water for support, and to offload joints by unweighting joint load by at least 50 % in navel deep water and by at least 75-80% in chest to neck deep water.  Viscosity of the water is needed for resistance of strengthening. Water current perturbations provides challenge to standing balance requiring increased core activation.  07/23/23 Pt seen for aquatic therapy today.  Treatment took place in water 3.5-4.75 ft in depth at the [facility] pool. Temp of water was 91.  Pt entered/exited the pool via stairs independently with  bilat rail.  -submerged to chest deep water *introduction to aquatic therapy principles and properties * UE on barbell:  walking forward/backward, multiple laps with cues for increased step length and to widen BOS;  side stepping R/L  * relaxed squat for rest and recovery * holding onto wall: heel raises x 10; alternating hip ext x 10; alternating hip abdct/ addct x 10 * high knee marching forward and backward with row motion of arms - 3 laps   Pt requires the buoyancy and hydrostatic pressure of water for support, and to offload joints by unweighting joint load by at least 50 % in navel deep water and by at least 75-80% in chest to neck deep water.  Viscosity of the water is needed for resistance of strengthening. Water current perturbations provides challenge to standing balance requiring increased core activation.    DATE:  07/02/23  Seated toe and heel raises x 20 Long arc quad x 20  March x 20 Started to initiate next exercise but patients transportation came and she felt she needed to leave so that she didn't miss her ride.     DATE:  06/11/23  Moist heat at back with x4 layers between skin and pack during stretches   Nustep 6 mins L3 -heat on during this Rows green band 2x10 Bil shoulder horizontal abduction green band 2x10 Seated same knee/hand press 2x10 each Functional ambulation with cues for midline stance without trunk sway or side lean for improved core activation and strength 250'  PATIENT EDUCATION:  Education details: urge drill, [id number] Person educated: Patient Education method: Explanation, Demonstration, Tactile cues, Verbal cues, and Handouts Education comprehension: verbalized understanding and returned demonstration  HOME EXERCISE PROGRAM: Access Code: [email or web address] URL: [email or web address] Date: 03/20/2023 Prepared by: Clydie Braun Menke Updated 05/21/23 - HALEY   Exercises - Seated Pelvic Floor Contraction with Isometric Hip Adduction  - 1 x  daily - 7 x weekly - 2 sets - 10 reps - Seated Transversus Abdominis Bracing  - 1 x daily - 7 x weekly - 2 sets - 10 reps - Seated Pelvic Floor  Contraction with Hip Abduction and Resistance Loop  - 1 x daily - 7 x weekly - 2 sets - 10 reps - Seated Hamstring Stretch  - 1 x daily - 7 x weekly - 1 sets - 2 reps - 20 sec hold - Sit to Stand  - 1 x daily - 7 x weekly - 2 sets - 5 reps  ASSESSMENT:  CLINICAL IMPRESSION: Pt arrived with elevated pain level.  She had more relief with side stepping vs forward/ backward gait.  While she did report increased Lt lbp with cycling in chair, she reported gradual reduction of pain in back to 4/10 during session. She tolerated aquatic exercises well.  Remaining goals are ongoing.    OBJECTIVE IMPAIRMENTS: decreased activity tolerance, decreased coordination, decreased endurance, decreased mobility, difficulty walking, decreased strength, increased fascial restrictions, increased muscle spasms, impaired flexibility, improper body mechanics, postural dysfunction, and pain.   ACTIVITY LIMITATIONS: carrying, lifting, bending, sitting, standing, squatting, transfers, and locomotion level  PARTICIPATION LIMITATIONS: driving, shopping, community activity, and yard work  PERSONAL FACTORS: Time since onset of injury/illness/exacerbation and 1 comorbidity: medical history  are also affecting patient's functional outcome.   REHAB POTENTIAL: Good  CLINICAL DECISION MAKING: Stable/uncomplicated  EVALUATION COMPLEXITY: Low   GOALS: Goals reviewed with patient? Yes  SHORT TERM GOALS: Target date: 03/26/23  Pt to be I with HEP.  Baseline: Goal status:MET  2.  Pt to demonstrate at least 3/5 pelvic floor strength for improved pelvic stability and decreased strain at pelvic floor/ decrease leakage.  Baseline:  Goal status: deferred   3.  Pt will have 25% less urgency due to bladder retraining and strengthening  Baseline:  Goal status: MET  4.  Pt to  demonstrate at least 4/5 bil hip strength for improved pelvic stability and functional squats without leakage.  Baseline:  Goal status: MET   LONG TERM GOALS: Target date: 09/04/23  Pt to be I with advanced HEP.  Baseline:  Goal status: on going  2.  Pt to demonstrate at least 5/5 bil hip strength for improved pelvic stability and functional squats without leakage.  Baseline:  Goal status: on going  3.  Pt will have 50% less urgency due to bladder retraining and strengthening  Baseline:  Goal status: on going  4.  Pt to demonstrate improved gait mechanics with improved cadence and posture for at least 250' with LRAD at mod I and min cues for improved mobility at home and for community activity.  Baseline:  Goal status: on going  5.  Pt to complete x10 squats with at least 10# for improved mobility and strength at hips for improved gait and transfers for decreased fall risk.  Baseline:  Goal status: on going   6. PT to demonstrate improved TUG test to no more than 20s with rolling walker for decreased fall risk Baseline:  Goal status: MET - 11s  7. Pt to score at least 25% better on [place] for improved tolerance to standing and activity. Baseline:  Goal status: NEW  8. Pt to report ability to stand with/out LRAD for at least 30 mins with minimal increase/no increase in pain for improved tolerance to working.   Baseline:  Goal status: NEW PLAN:  PT FREQUENCY: 1-2x/week  PT DURATION:  12 sessions  PLANNED INTERVENTIONS: Therapeutic exercises, Therapeutic activity, Neuromuscular re-education, Balance training, Gait training, Patient/Family education, Self Care, Joint mobilization, DME instructions, Aquatic Therapy, Dry Needling, Spinal mobilization, Cryotherapy, Moist heat, scar mobilization, Taping, Biofeedback, and Manual therapy  PLAN FOR NEXT SESSION: Continue hip, core, pelvic floor strengthening, activity tolerance, mobility at spine and hips  Mayer Camel,  PTA 07/30/23 2:44 PM [facility] [address] [place], [place], [address] Phone: [phone]   Fax:  [phone]

## 2023-08-06 ENCOUNTER — Ambulatory Visit: Payer: BC Managed Care – PPO | Attending: Surgery

## 2023-08-06 DIAGNOSIS — R252 Cramp and spasm: Secondary | ICD-10-CM | POA: Insufficient documentation

## 2023-08-06 DIAGNOSIS — R293 Abnormal posture: Secondary | ICD-10-CM | POA: Insufficient documentation

## 2023-08-06 DIAGNOSIS — M546 Pain in thoracic spine: Secondary | ICD-10-CM | POA: Insufficient documentation

## 2023-08-06 DIAGNOSIS — M545 Low back pain, unspecified: Secondary | ICD-10-CM | POA: Insufficient documentation

## 2023-08-06 DIAGNOSIS — R262 Difficulty in walking, not elsewhere classified: Secondary | ICD-10-CM | POA: Diagnosis not present

## 2023-08-06 DIAGNOSIS — M6281 Muscle weakness (generalized): Secondary | ICD-10-CM | POA: Insufficient documentation

## 2023-08-06 DIAGNOSIS — M62830 Muscle spasm of back: Secondary | ICD-10-CM | POA: Diagnosis not present

## 2023-08-06 NOTE — Therapy (Signed)
OUTPATIENT PHYSICAL THERAPY FEMALE PELVIC TREATMENT NOTE   Patient Name: Darlene Griffith MRN: [id number] DOB:01/11/1951, 72 y.o., female Today's Date: 08/06/2023  END OF SESSION:  PT End of Session - 08/06/23 1239     Visit Number 12    Date for PT Re-Evaluation 09/04/23    Authorization Type [employer]    Progress Note Due on Visit 17    PT Start Time 1230    PT Stop Time 1300    PT Time Calculation (min) 30 min    Activity Tolerance Patient limited by pain    Behavior During Therapy [facility] for tasks assessed/performed                Past Medical History:  Diagnosis Date   Chest pain    a. 01/2015 Echo: Nl LV fxn, Gr 1 DD, triv AI, mild MR.   Essential hypertension    Hepatic cyst    a. noted on CT 01/2015.   Hyperthyroidism    Multinodular goiter    a. 01/2015 CT chest: multinodular goidter w/ substernal extension of the left lobe of the thyroid assoc w/ rightward deviation of tracheal air column.   Neck pain, chronic    Personal history of colonic polyps    Pulmonary nodules    a. 01/2015 CT Chest: RLL ~ 5mm subpleural nodule - rec f/u in 6-12 mos.   Splenic cyst    a. noted on CT 01/2015.   Past Surgical History:  Procedure Laterality Date   BYPASS GRAFT AORTA TO AORTA Left 11/11/2021   Procedure: BYPASS GRAFT AORTA TO LEFT COMMON  FEMORAL ARTERY;  Surgeon: Leonie Douglas, MD;  Location: [facility] OR;  Service: Vascular;  Laterality: Left;   CERVICAL DISC ARTHROPLASTY N/A 07/05/2019   Procedure: Cervical Six-Seven Artificial disc replacement;  Surgeon: Barnett Abu, MD;  Location: [facility] OR;  Service: Neurosurgery;  Laterality: N/A;  Cervical Six-Seven Artificial disc replacement   CESAREAN SECTION     COLONOSCOPY  01/13/2020   THORACIC AORTIC ENDOVASCULAR STENT GRAFT N/A 11/11/2021   Procedure: THORACIC AORTIC ENDOVASCULAR STENT GRAFT WITH LEFT BRACHIAL  ARTERY ACCESS;  Surgeon: Leonie Douglas, MD;  Location: [facility] OR;  Service: Vascular;  Laterality: N/A;   TONSILLECTOMY      AROUND 5-6 YRS OLD   ULTRASOUND GUIDANCE FOR VASCULAR ACCESS Bilateral 11/11/2021   Procedure: ULTRASOUND GUIDANCE FOR VASCULAR ACCESS;  Surgeon: Leonie Douglas, MD;  Location: [facility] OR;  Service: Vascular;  Laterality: Bilateral;   UPPER GASTROINTESTINAL ENDOSCOPY  01/13/2020   Patient Active Problem List   Diagnosis Date Noted   Seasonal allergic rhinitis 12/19/2022   Urge incontinence of urine 12/19/2022   Incisional hernia, without obstruction or gangrene 09/26/2022   COPD (chronic obstructive pulmonary disease) (HCC) 11/08/2021   AKI (acute kidney injury) (HCC)    Aortic dissection (HCC) 11/06/2021   Gastroesophageal reflux disease without esophagitis    Hyperthyroidism 04/19/2021   Snoring 03/02/2020   Tobacco use 03/02/2020   Genetic testing 02/10/2020   Family history of lung cancer    History of colonic polyps    Dysphagia 01/06/2020   Cervical spondylosis with myelopathy and radiculopathy 07/05/2019   Neck pain, chronic 05/21/2019   Insomnia 07/27/2018   Multinodular goiter 07/14/2018   Chronic fatigue 06/15/2018   Midsternal chest pain 02/18/2015   Chest pain    Cough    Essential hypertension    Shortness of breath    Aortic stenosis    Pain in the chest  EKG abnormality    Malaise and fatigue    Lung nodule    Splenic cyst    Hepatic cyst    Hyperlipidemia    Hypertension 02/16/2015    PCP: [place], Betty G, MD  REFERRING PROVIDER: Stechschulte, Hyman Hopes, MD   REFERRING DIAG: [phone] (ICD-10-CM) - Incisional hernia, without obstruction or gangrene  THERAPY DIAG:  Muscle spasm of back  Acute bilateral low back pain without sciatica  Pain in thoracic spine  Cramp and spasm  Difficulty in walking, not elsewhere classified  Muscle weakness (generalized)  Abnormal posture  Rationale for Evaluation and Treatment: Rehabilitation  ONSET DATE: one year - since leaving hospital last year  SUBJECTIVE:                                                                                                                                                                                            SUBJECTIVE STATEMENT: Pt reports that she is hurting all over today.      PAIN:  Are you having pain? Yes NPRS scale: 10/10 Pain location:  low back  Pain type: burning and sharp Pain description: constant   Aggravating factors: standing, sitting, prolonged walking Relieving factors: rest but not too long  PRECAUTIONS: Fall  WEIGHT BEARING RESTRICTIONS: No  FALLS:  Has patient fallen in last 6 months? No  LIVING ENVIRONMENT: Lives with: lives alone   OCCUPATION: healthcare medications shipping  PLOF: Independent  PATIENT GOALS: to have less pain  PERTINENT HISTORY:    HTN, aortic bypass, c-section, chronic fatigue, SOB, Aortic stenosis,takes nitroglycerin for chest pain,  CHF Complications from surgery last year with iliac artery rupture requiring transverse incision in Lt lower quadrant for retroperitoneal exposure of the L iliac vessel for L iliac to common femoral bypass. Developed hernia, consulted for hernia repair, but per pt MD recommended no surgery.   Sexual abuse: No  BOWEL MOVEMENT: Pain with bowel movement: No Type of bowel movement:Type (Bristol Stool Scale) 2-6, Frequency after all meals (within 10 mins), and Strain No Fully empty rectum: Yes:   Leakage: No Pads: No Fiber supplement: No  URINATION: Pain with urination: No Fully empty bladder: Yes:   Stream: Strong and Weak Urgency: Yes:   Frequency: increased every 30-45 mins, 3-4x night Leakage: Urge to void Pads: Yes: most of the time  INTERCOURSE: Pain with intercourse:  not painful  Ability to have vaginal penetration:  Yes:   Climax: not painful Marinoff Scale: 0/3  PREGNANCY: Vaginal deliveries 3 Tearing Yes: full 4th grade tearing with breech baby C-section deliveries 1 Currently pregnant No  PROLAPSE: None   OBJECTIVE:  DIAGNOSTIC  FINDINGS:   Oswestry Score: 27 / 50 or 54 % 06/04/23   f/u lumbar spinal stenosis w/ bilat leg pain, L>R, w/ L-spine MRI  transcatheter aortic valve replacement and bypass grafting  urinary urgency and has she get up to go to the bathroom multiple times a day.  CT 01/04/2023. IMPRESSION: 1. No acute findings or explanation for the patient's symptoms. 2. Mild disc bulging and facet hypertrophy as described without resulting significant spinal stenosis or nerve root encroachment.  COGNITION: Overall cognitive status: Within functional limits for tasks assessed     SENSATION: Light touch: Appears intact Proprioception: Appears intact  MUSCLE LENGTH: Bil hamstrings and adductors limited by 50%  LUMBAR SPECIAL TESTS:  Single leg stance test: 2s Rt 1s lt , SI Compression/distraction test: pain with both, and FABER test: pain bil  TUG:31s with rolling walker  GAIT: Distance walked: 150' Assistive device utilized: Environmental consultant - 4 wheeled Level of assistance: Modified independence Comments: decreased cadence, very forward trunk, decreased stride length, decreased step height bil  POSTURE: rounded shoulders, forward head, anterior pelvic tilt, and flexed trunk    LUMBARAROM/PROM:  A/PROM A/PROM  eval  Flexion Limited  by 75%  Extension Limited  by 50%  Right lateral flexion Limited  by 50%  Left lateral flexion Limited  by 50%  Right rotation Limited  by 75%  Left rotation Limited  by 75%   (Blank rows = not tested)  LOWER EXTREMITY ROM:  WFL LOWER EXTREMITY MMT:  MMT Right eval Left eval  Hip flexion 4 4  Hip extension 3+ 3+  Hip abduction 3 3  Hip adduction 3+ 3+  Hip internal rotation    Hip external rotation    Knee flexion 4 4  Knee extension 4 4  Ankle dorsiflexion    Ankle plantarflexion    Ankle inversion    Ankle eversion     PALPATION:   General  TTP throughout abdomen, tight thoracic and lumbar spine, TTP from T4-L5 worse at mid thoracic range, TTP  at PSIS bil                External Perineal Exam deferred today                             Internal Pelvic Floor deferred today  Patient confirms identification and approves PT to assess internal pelvic floor and treatment No  PELVIC MMT:   MMT eval  Vaginal   Internal Anal Sphincter   External Anal Sphincter   Puborectalis   Diastasis Recti   (Blank rows = not tested)        TONE: Deferred   PROLAPSE: deferred  TODAY'S TREATMENT:                                                                                                                              DATE:  08/06/23  Nustep  6 mins L3  Seated hamstring stretch 3 x 30 sec each LE Seated trunk extension with rows with red serious steel band 2 x 10 Seated chest press with red serious steel band 2 x 10  Mini sit ups with 2 lbs Rows green band 2x10 Marching in place x 20   07/30/23 Pt seen for aquatic therapy today.  Treatment took place in water 3.5-4.75 ft in depth at the [facility] pool. Temp of water was 91.  Pt entered/exited the pool via stairs independently with bilat rail.  * UE on barbell:  walking forward/backward,  * relaxed squat for rest and recovery * UE  on barbell: return to walking forward/ backward x 2, then side stepping R/L x 2 * seated in lift chair:  alternating LAQ x 10; cycling legs (painful on left back); hip abdct/ addct;  TrA set with short hollow noodle pull down to legs x 15 * holding onto wall: heel raises x 10; alternating hip ext x 10; alternating hip abdct/ addct x 10 * solid noodle under arms -> back float for spine decompression x 1 min, x 2 - min A to return to standing with cues for head forward and knees flexed  Pt requires the buoyancy and hydrostatic pressure of water for support, and to offload joints by unweighting joint load by at least 50 % in navel deep water and by at least 75-80% in chest to neck deep water.  Viscosity of the water is needed for resistance of  strengthening. Water current perturbations provides challenge to standing balance requiring increased core activation.  07/23/23 Pt seen for aquatic therapy today.  Treatment took place in water 3.5-4.75 ft in depth at the [facility] pool. Temp of water was 91.  Pt entered/exited the pool via stairs independently with bilat rail.  -submerged to chest deep water *introduction to aquatic therapy principles and properties * UE on barbell:  walking forward/backward, multiple laps with cues for increased step length and to widen BOS;  side stepping R/L  * relaxed squat for rest and recovery * holding onto wall: heel raises x 10; alternating hip ext x 10; alternating hip abdct/ addct x 10 * high knee marching forward and backward with row motion of arms - 3 laps   Pt requires the buoyancy and hydrostatic pressure of water for support, and to offload joints by unweighting joint load by at least 50 % in navel deep water and by at least 75-80% in chest to neck deep water.  Viscosity of the water is needed for resistance of strengthening. Water current perturbations provides challenge to standing balance requiring increased core activation.    PATIENT EDUCATION:  Education details: urge drill, [id number] Person educated: Patient Education method: Explanation, Demonstration, Tactile cues, Verbal cues, and Handouts Education comprehension: verbalized understanding and returned demonstration  HOME EXERCISE PROGRAM: Access Code: [email or web address] URL: [email or web address] Date: 03/20/2023 Prepared by: Clydie Braun Menke Updated 05/21/23 - HALEY   Exercises - Seated Pelvic Floor Contraction with Isometric Hip Adduction  - 1 x daily - 7 x weekly - 2 sets - 10 reps - Seated Transversus Abdominis Bracing  - 1 x daily - 7 x weekly - 2 sets - 10 reps - Seated Pelvic Floor Contraction with Hip Abduction and Resistance Loop  - 1 x daily - 7 x weekly - 2 sets - 10 reps - Seated Hamstring Stretch   - 1 x daily - 7 x weekly - 1 sets - 2 reps - 20 sec  hold - Sit to Stand  - 1 x daily - 7 x weekly - 2 sets - 5 reps  ASSESSMENT:  CLINICAL IMPRESSION: Pt arrived with elevated pain level.  She felt like she couldn't do much but didn't want to cancel.  She was able to complete a few low level core and lumbar strengthening exercises.  She is quite talkative and needs frequent re-direction.  She tolerates aquatic therapy with less pain.  She would benefit from continued skilled PT for core strength and LE flexibility.      OBJECTIVE IMPAIRMENTS: decreased activity tolerance, decreased coordination, decreased endurance, decreased mobility, difficulty walking, decreased strength, increased fascial restrictions, increased muscle spasms, impaired flexibility, improper body mechanics, postural dysfunction, and pain.   ACTIVITY LIMITATIONS: carrying, lifting, bending, sitting, standing, squatting, transfers, and locomotion level  PARTICIPATION LIMITATIONS: driving, shopping, community activity, and yard work  PERSONAL FACTORS: Time since onset of injury/illness/exacerbation and 1 comorbidity: medical history  are also affecting patient's functional outcome.   REHAB POTENTIAL: Good  CLINICAL DECISION MAKING: Stable/uncomplicated  EVALUATION COMPLEXITY: Low   GOALS: Goals reviewed with patient? Yes  SHORT TERM GOALS: Target date: 03/26/23  Pt to be I with HEP.  Baseline: Goal status:MET  2.  Pt to demonstrate at least 3/5 pelvic floor strength for improved pelvic stability and decreased strain at pelvic floor/ decrease leakage.  Baseline:  Goal status: deferred   3.  Pt will have 25% less urgency due to bladder retraining and strengthening  Baseline:  Goal status: MET  4.  Pt to demonstrate at least 4/5 bil hip strength for improved pelvic stability and functional squats without leakage.  Baseline:  Goal status: MET   LONG TERM GOALS: Target date: 09/04/23  Pt to be I with advanced  HEP.  Baseline:  Goal status: on going  2.  Pt to demonstrate at least 5/5 bil hip strength for improved pelvic stability and functional squats without leakage.  Baseline:  Goal status: on going  3.  Pt will have 50% less urgency due to bladder retraining and strengthening  Baseline:  Goal status: on going  4.  Pt to demonstrate improved gait mechanics with improved cadence and posture for at least 250' with LRAD at mod I and min cues for improved mobility at home and for community activity.  Baseline:  Goal status: on going  5.  Pt to complete x10 squats with at least 10# for improved mobility and strength at hips for improved gait and transfers for decreased fall risk.  Baseline:  Goal status: on going   6. PT to demonstrate improved TUG test to no more than 20s with rolling walker for decreased fall risk Baseline:  Goal status: MET - 11s  7. Pt to score at least 25% better on [place] for improved tolerance to standing and activity. Baseline:  Goal status: NEW  8. Pt to report ability to stand with/out LRAD for at least 30 mins with minimal increase/no increase in pain for improved tolerance to working.   Baseline:  Goal status: NEW PLAN:  PT FREQUENCY: 1-2x/week  PT DURATION:  12 sessions  PLANNED INTERVENTIONS: Therapeutic exercises, Therapeutic activity, Neuromuscular re-education, Balance training, Gait training, Patient/Family education, Self Care, Joint mobilization, DME instructions, Aquatic Therapy, Dry Needling, Spinal mobilization, Cryotherapy, Moist heat, scar mobilization, Taping, Biofeedback, and Manual therapy  PLAN FOR NEXT SESSION: Continue hip, core, pelvic floor strengthening, activity tolerance, mobility at spine and hips  Marnisha Stampley B. Sherese Heyward, PT 08/06/23 5:04 PM [facility] [address], [address] [place], [place] [address] Phone # [phone] Fax [phone]

## 2023-08-10 ENCOUNTER — Telehealth: Payer: Self-pay

## 2023-08-10 NOTE — Telephone Encounter (Signed)
Notice of incomplete or insufficient documentation  DUE 08/12/23

## 2023-08-11 NOTE — Telephone Encounter (Addendum)
Called [facility] and was advised that adjustment needed to be made to amount of leave. Requested up to 9 days of leave lasting up to 1 day per month as pt see's PT 1-2 x weekly and is to f/u with Dr. Denyse Amass q2-76m.   Form completed and placed at the front desk for faxing/scanning.

## 2023-08-13 ENCOUNTER — Ambulatory Visit (HOSPITAL_BASED_OUTPATIENT_CLINIC_OR_DEPARTMENT_OTHER): Payer: BC Managed Care – PPO | Admitting: Physical Therapy

## 2023-08-20 ENCOUNTER — Encounter (HOSPITAL_BASED_OUTPATIENT_CLINIC_OR_DEPARTMENT_OTHER): Payer: Self-pay | Admitting: Physical Therapy

## 2023-08-20 ENCOUNTER — Ambulatory Visit (HOSPITAL_BASED_OUTPATIENT_CLINIC_OR_DEPARTMENT_OTHER): Payer: BC Managed Care – PPO | Admitting: Physical Therapy

## 2023-08-20 DIAGNOSIS — M545 Low back pain, unspecified: Secondary | ICD-10-CM

## 2023-08-20 DIAGNOSIS — M546 Pain in thoracic spine: Secondary | ICD-10-CM

## 2023-08-20 DIAGNOSIS — M62830 Muscle spasm of back: Secondary | ICD-10-CM

## 2023-08-20 DIAGNOSIS — K432 Incisional hernia without obstruction or gangrene: Secondary | ICD-10-CM | POA: Diagnosis not present

## 2023-08-20 DIAGNOSIS — R252 Cramp and spasm: Secondary | ICD-10-CM

## 2023-08-20 NOTE — Therapy (Signed)
OUTPATIENT PHYSICAL THERAPY FEMALE PELVIC TREATMENT NOTE   Patient Name: Darlene Griffith MRN: [id number] DOB:1951-09-26, 72 y.o., female Today's Date: 08/20/2023  END OF SESSION:  PT End of Session - 08/20/23 1415     Visit Number 13    Date for PT Re-Evaluation 09/04/23    Authorization Type [employer]    Progress Note Due on Visit 17    PT Start Time 1400    PT Stop Time 1440    PT Time Calculation (min) 40 min    Behavior During Therapy [facility] for tasks assessed/performed                Past Medical History:  Diagnosis Date   Chest pain    a. 01/2015 Echo: Nl LV fxn, Gr 1 DD, triv AI, mild MR.   Essential hypertension    Hepatic cyst    a. noted on CT 01/2015.   Hyperthyroidism    Multinodular goiter    a. 01/2015 CT chest: multinodular goidter w/ substernal extension of the left lobe of the thyroid assoc w/ rightward deviation of tracheal air column.   Neck pain, chronic    Personal history of colonic polyps    Pulmonary nodules    a. 01/2015 CT Chest: RLL ~ 5mm subpleural nodule - rec f/u in 6-12 mos.   Splenic cyst    a. noted on CT 01/2015.   Past Surgical History:  Procedure Laterality Date   BYPASS GRAFT AORTA TO AORTA Left 11/11/2021   Procedure: BYPASS GRAFT AORTA TO LEFT COMMON  FEMORAL ARTERY;  Surgeon: Leonie Douglas, MD;  Location: [facility] OR;  Service: Vascular;  Laterality: Left;   CERVICAL DISC ARTHROPLASTY N/A 07/05/2019   Procedure: Cervical Six-Seven Artificial disc replacement;  Surgeon: Barnett Abu, MD;  Location: [facility] OR;  Service: Neurosurgery;  Laterality: N/A;  Cervical Six-Seven Artificial disc replacement   CESAREAN SECTION     COLONOSCOPY  01/13/2020   THORACIC AORTIC ENDOVASCULAR STENT GRAFT N/A 11/11/2021   Procedure: THORACIC AORTIC ENDOVASCULAR STENT GRAFT WITH LEFT BRACHIAL  ARTERY ACCESS;  Surgeon: Leonie Douglas, MD;  Location: [facility] OR;  Service: Vascular;  Laterality: N/A;   TONSILLECTOMY     AROUND 5-6 YRS OLD   ULTRASOUND GUIDANCE FOR  VASCULAR ACCESS Bilateral 11/11/2021   Procedure: ULTRASOUND GUIDANCE FOR VASCULAR ACCESS;  Surgeon: Leonie Douglas, MD;  Location: [facility] OR;  Service: Vascular;  Laterality: Bilateral;   UPPER GASTROINTESTINAL ENDOSCOPY  01/13/2020   Patient Active Problem List   Diagnosis Date Noted   Seasonal allergic rhinitis 12/19/2022   Urge incontinence of urine 12/19/2022   Incisional hernia, without obstruction or gangrene 09/26/2022   COPD (chronic obstructive pulmonary disease) (HCC) 11/08/2021   AKI (acute kidney injury) (HCC)    Aortic dissection (HCC) 11/06/2021   Gastroesophageal reflux disease without esophagitis    Hyperthyroidism 04/19/2021   Snoring 03/02/2020   Tobacco use 03/02/2020   Genetic testing 02/10/2020   Family history of lung cancer    History of colonic polyps    Dysphagia 01/06/2020   Cervical spondylosis with myelopathy and radiculopathy 07/05/2019   Neck pain, chronic 05/21/2019   Insomnia 07/27/2018   Multinodular goiter 07/14/2018   Chronic fatigue 06/15/2018   Midsternal chest pain 02/18/2015   Chest pain    Cough    Essential hypertension    Shortness of breath    Aortic stenosis    Pain in the chest    EKG abnormality    Malaise and  fatigue    Lung nodule    Splenic cyst    Hepatic cyst    Hyperlipidemia    Hypertension 02/16/2015    PCP: [place], Betty G, MD  REFERRING PROVIDER: Stechschulte, Hyman Hopes, MD   REFERRING DIAG: [phone] (ICD-10-CM) - Incisional hernia, without obstruction or gangrene  THERAPY DIAG:  Muscle spasm of back  Acute bilateral low back pain without sciatica  Pain in thoracic spine  Cramp and spasm  Rationale for Evaluation and Treatment: Rehabilitation  ONSET DATE: one year - since leaving hospital last year  SUBJECTIVE:                                                                                                                                                                                           SUBJECTIVE  STATEMENT: Pt reports that has had increased pain since last land visit.    Pt hasn't checked into pool yet.  "I've been looking forward to today".    PAIN:  Are you having pain? Yes NPRS scale: 7/10 Pain location:  low back  Pain type: burning and sharp Pain description: constant   Aggravating factors: standing, sitting, prolonged walking Relieving factors: rest but not too long  PRECAUTIONS: Fall  WEIGHT BEARING RESTRICTIONS: No  FALLS:  Has patient fallen in last 6 months? No  LIVING ENVIRONMENT: Lives with: lives alone   OCCUPATION: healthcare medications shipping  PLOF: Independent  PATIENT GOALS: to have less pain  PERTINENT HISTORY:    HTN, aortic bypass, c-section, chronic fatigue, SOB, Aortic stenosis,takes nitroglycerin for chest pain,  CHF Complications from surgery last year with iliac artery rupture requiring transverse incision in Lt lower quadrant for retroperitoneal exposure of the L iliac vessel for L iliac to common femoral bypass. Developed hernia, consulted for hernia repair, but per pt MD recommended no surgery.   Sexual abuse: No  BOWEL MOVEMENT: Pain with bowel movement: No Type of bowel movement:Type (Bristol Stool Scale) 2-6, Frequency after all meals (within 10 mins), and Strain No Fully empty rectum: Yes:   Leakage: No Pads: No Fiber supplement: No  URINATION: Pain with urination: No Fully empty bladder: Yes:   Stream: Strong and Weak Urgency: Yes:   Frequency: increased every 30-45 mins, 3-4x night Leakage: Urge to void Pads: Yes: most of the time  INTERCOURSE: Pain with intercourse:  not painful  Ability to have vaginal penetration:  Yes:   Climax: not painful Marinoff Scale: 0/3  PREGNANCY: Vaginal deliveries 3 Tearing Yes: full 4th grade tearing with breech baby C-section deliveries 1 Currently pregnant No  PROLAPSE: None   OBJECTIVE:   DIAGNOSTIC FINDINGS:  Oswestry Score: 27 / 50 or 54 % 06/04/23   f/u  lumbar spinal stenosis w/ bilat leg pain, L>R, w/ L-spine MRI  transcatheter aortic valve replacement and bypass grafting  urinary urgency and has she get up to go to the bathroom multiple times a day.  CT 01/04/2023. IMPRESSION: 1. No acute findings or explanation for the patient's symptoms. 2. Mild disc bulging and facet hypertrophy as described without resulting significant spinal stenosis or nerve root encroachment.  COGNITION: Overall cognitive status: Within functional limits for tasks assessed     SENSATION: Light touch: Appears intact Proprioception: Appears intact  MUSCLE LENGTH: Bil hamstrings and adductors limited by 50%  LUMBAR SPECIAL TESTS:  Single leg stance test: 2s Rt 1s lt , SI Compression/distraction test: pain with both, and FABER test: pain bil  TUG:31s with rolling walker  GAIT: Distance walked: 150' Assistive device utilized: Environmental consultant - 4 wheeled Level of assistance: Modified independence Comments: decreased cadence, very forward trunk, decreased stride length, decreased step height bil  POSTURE: rounded shoulders, forward head, anterior pelvic tilt, and flexed trunk    LUMBARAROM/PROM:  A/PROM A/PROM  eval  Flexion Limited  by 75%  Extension Limited  by 50%  Right lateral flexion Limited  by 50%  Left lateral flexion Limited  by 50%  Right rotation Limited  by 75%  Left rotation Limited  by 75%   (Blank rows = not tested)  LOWER EXTREMITY ROM:  WFL LOWER EXTREMITY MMT:  MMT Right eval Left eval  Hip flexion 4 4  Hip extension 3+ 3+  Hip abduction 3 3  Hip adduction 3+ 3+  Hip internal rotation    Hip external rotation    Knee flexion 4 4  Knee extension 4 4  Ankle dorsiflexion    Ankle plantarflexion    Ankle inversion    Ankle eversion     PALPATION:   General  TTP throughout abdomen, tight thoracic and lumbar spine, TTP from T4-L5 worse at mid thoracic range, TTP at PSIS bil                External Perineal Exam deferred  today                             Internal Pelvic Floor deferred today  Patient confirms identification and approves PT to assess internal pelvic floor and treatment No  PELVIC MMT:   MMT eval  Vaginal   Internal Anal Sphincter   External Anal Sphincter   Puborectalis   Diastasis Recti   (Blank rows = not tested)        TONE: Deferred   PROLAPSE: deferred  TODAY'S TREATMENT:                                                                                                                              08/20/23 Pt seen for aquatic therapy today.  Treatment  took place in water 3.5-4.75 ft in depth at the [facility] pool. Temp of water was 91.  Pt entered/exited the pool via stairs independently with bilat rail.  * UE on barbell:  walking forward/backward, side stepping R/L  * relaxed squat for rest and recovery * UE on wall: alternating hip ext to toe touch x 10; Hip abdct/ addct x 10 ; heel raises x 10; single leg clam (painful)  *return to walking forward/ backward, unsupported x 2 laps * TrA set with short hollow noodle pull down to legs x 10 (challenge) * noodle under arms, holding corner:  cycling,  suspended hip abdct/ addct; cycling * solid noodle under arms -> back float at stairs for spine decompression x 1.5 min, CGA to return to standing with cues for head forward and knees flexed  Pt requires the buoyancy and hydrostatic pressure of water for support, and to offload joints by unweighting joint load by at least 50 % in navel deep water and by at least 75-80% in chest to neck deep water.  Viscosity of the water is needed for resistance of strengthening. Water current perturbations provides challenge to standing balance requiring increased core activation.  DATE:  08/06/23  Nustep 6 mins L3  Seated hamstring stretch 3 x 30 sec each LE Seated trunk extension with rows with red serious steel band 2 x 10 Seated chest press with red serious steel band 2 x 10  Mini  sit ups with 2 lbs Rows green band 2x10 Marching in place x 20   07/30/23 Pt seen for aquatic therapy today.  Treatment took place in water 3.5-4.75 ft in depth at the [facility] pool. Temp of water was 91.  Pt entered/exited the pool via stairs independently with bilat rail.  * UE on barbell:  walking forward/backward,  * relaxed squat for rest and recovery * UE  on barbell: return to walking forward/ backward x 2, then side stepping R/L x 2 * seated in lift chair:  alternating LAQ x 10; cycling legs (painful on left back); hip abdct/ addct;  TrA set with short hollow noodle pull down to legs x 15 * holding onto wall: heel raises x 10; alternating hip ext x 10; alternating hip abdct/ addct x 10 * solid noodle under arms -> back float for spine decompression x 1 min, x 2 - min A to return to standing with cues for head forward and knees flexed  Pt requires the buoyancy and hydrostatic pressure of water for support, and to offload joints by unweighting joint load by at least 50 % in navel deep water and by at least 75-80% in chest to neck deep water.  Viscosity of the water is needed for resistance of strengthening. Water current perturbations provides challenge to standing balance requiring increased core activation.  07/23/23 Pt seen for aquatic therapy today.  Treatment took place in water 3.5-4.75 ft in depth at the [facility] pool. Temp of water was 91.  Pt entered/exited the pool via stairs independently with bilat rail.  -submerged to chest deep water *introduction to aquatic therapy principles and properties * UE on barbell:  walking forward/backward, multiple laps with cues for increased step length and to widen BOS;  side stepping R/L  * relaxed squat for rest and recovery * holding onto wall: heel raises x 10; alternating hip ext x 10; alternating hip abdct/ addct x 10 * high knee marching forward and backward with row motion of arms - 3 laps  Pt requires  the buoyancy and hydrostatic pressure of water for support, and to offload joints by unweighting joint load by at least 50 % in navel deep water and by at least 75-80% in chest to neck deep water.  Viscosity of the water is needed for resistance of strengthening. Water current perturbations provides challenge to standing balance requiring increased core activation.    PATIENT EDUCATION:  Education details: aquatic exercise progressions / modifications  Person educated: Patient Education method: Explanation, Demonstration, Tactile cues, Verbal cues Education comprehension: verbalized understanding and returned demonstration  HOME EXERCISE PROGRAM: Access Code: [email or web address] URL: [email or web address] Date: 03/20/2023 Prepared by: Clydie Braun Menke Updated 05/21/23 - HALEY   Exercises - Seated Pelvic Floor Contraction with Isometric Hip Adduction  - 1 x daily - 7 x weekly - 2 sets - 10 reps - Seated Transversus Abdominis Bracing  - 1 x daily - 7 x weekly - 2 sets - 10 reps - Seated Pelvic Floor Contraction with Hip Abduction and Resistance Loop  - 1 x daily - 7 x weekly - 2 sets - 10 reps - Seated Hamstring Stretch  - 1 x daily - 7 x weekly - 1 sets - 2 reps - 20 sec hold - Sit to Stand  - 1 x daily - 7 x weekly - 2 sets - 5 reps  ASSESSMENT:  CLINICAL IMPRESSION: Pt arrived with elevated pain level.  Pt tolerated aquatic exercises well and reported reduction of pain to 5/10.  Plan to create aquatic HEP in coming visits to issue in future visit.  She plans to check out local pools soon.  She would benefit from continued skilled PT for core strength and LE flexibility.      OBJECTIVE IMPAIRMENTS: decreased activity tolerance, decreased coordination, decreased endurance, decreased mobility, difficulty walking, decreased strength, increased fascial restrictions, increased muscle spasms, impaired flexibility, improper body mechanics, postural dysfunction, and pain.   ACTIVITY  LIMITATIONS: carrying, lifting, bending, sitting, standing, squatting, transfers, and locomotion level  PARTICIPATION LIMITATIONS: driving, shopping, community activity, and yard work  PERSONAL FACTORS: Time since onset of injury/illness/exacerbation and 1 comorbidity: medical history  are also affecting patient's functional outcome.   REHAB POTENTIAL: Good  CLINICAL DECISION MAKING: Stable/uncomplicated  EVALUATION COMPLEXITY: Low   GOALS: Goals reviewed with patient? Yes  SHORT TERM GOALS: Target date: 03/26/23  Pt to be I with HEP.  Baseline: Goal status:MET  2.  Pt to demonstrate at least 3/5 pelvic floor strength for improved pelvic stability and decreased strain at pelvic floor/ decrease leakage.  Baseline:  Goal status: deferred   3.  Pt will have 25% less urgency due to bladder retraining and strengthening  Baseline:  Goal status: MET  4.  Pt to demonstrate at least 4/5 bil hip strength for improved pelvic stability and functional squats without leakage.  Baseline:  Goal status: MET   LONG TERM GOALS: Target date: 09/04/23  Pt to be I with advanced HEP.  Baseline:  Goal status: on going  2.  Pt to demonstrate at least 5/5 bil hip strength for improved pelvic stability and functional squats without leakage.  Baseline:  Goal status: on going  3.  Pt will have 50% less urgency due to bladder retraining and strengthening  Baseline:  Goal status: on going  4.  Pt to demonstrate improved gait mechanics with improved cadence and posture for at least 250' with LRAD at mod I and min cues for improved mobility at home and for community activity.  Baseline:  Goal status: on going  5.  Pt to complete x10 squats with at least 10# for improved mobility and strength at hips for improved gait and transfers for decreased fall risk.  Baseline:  Goal status: on going   6. PT to demonstrate improved TUG test to no more than 20s with rolling walker for decreased fall  risk Baseline:  Goal status: MET - 11s  7. Pt to score at least 25% better on [place] for improved tolerance to standing and activity. Baseline:  Goal status: NEW  8. Pt to report ability to stand with/out LRAD for at least 30 mins with minimal increase/no increase in pain for improved tolerance to working.   Baseline:  Goal status: NEW PLAN:  PT FREQUENCY: 1-2x/week  PT DURATION:  12 sessions  PLANNED INTERVENTIONS: Therapeutic exercises, Therapeutic activity, Neuromuscular re-education, Balance training, Gait training, Patient/Family education, Self Care, Joint mobilization, DME instructions, Aquatic Therapy, Dry Needling, Spinal mobilization, Cryotherapy, Moist heat, scar mobilization, Taping, Biofeedback, and Manual therapy  PLAN FOR NEXT SESSION: Continue hip, core, pelvic floor strengthening, activity tolerance, mobility at spine and hips  Mayer Camel, PTA 08/20/23 3:00 PM [facility] [address] [place], [place], [address] Phone: [phone]   Fax:  [phone]

## 2023-08-27 ENCOUNTER — Ambulatory Visit (HOSPITAL_BASED_OUTPATIENT_CLINIC_OR_DEPARTMENT_OTHER): Payer: BC Managed Care – PPO | Attending: Family Medicine | Admitting: Physical Therapy

## 2023-08-27 ENCOUNTER — Encounter (HOSPITAL_BASED_OUTPATIENT_CLINIC_OR_DEPARTMENT_OTHER): Payer: Self-pay | Admitting: Physical Therapy

## 2023-08-27 ENCOUNTER — Ambulatory Visit: Payer: BC Managed Care – PPO

## 2023-08-27 DIAGNOSIS — M62830 Muscle spasm of back: Secondary | ICD-10-CM | POA: Diagnosis not present

## 2023-08-27 DIAGNOSIS — R252 Cramp and spasm: Secondary | ICD-10-CM | POA: Diagnosis not present

## 2023-08-27 DIAGNOSIS — M545 Low back pain, unspecified: Secondary | ICD-10-CM | POA: Insufficient documentation

## 2023-08-27 DIAGNOSIS — M546 Pain in thoracic spine: Secondary | ICD-10-CM | POA: Diagnosis not present

## 2023-08-27 NOTE — Therapy (Signed)
OUTPATIENT PHYSICAL THERAPY FEMALE PELVIC TREATMENT NOTE   Patient Name: Darlene Griffith MRN: [id number] DOB:02/10/1951, 72 y.o., female Today's Date: 08/27/2023  END OF SESSION:  PT End of Session - 08/27/23 1433     Visit Number 14    Date for PT Re-Evaluation 09/04/23    Authorization Type [employer]    Progress Note Due on Visit 17    PT Start Time 1407    PT Stop Time 1445    PT Time Calculation (min) 38 min    Activity Tolerance Patient limited by pain                 Past Medical History:  Diagnosis Date   Chest pain    a. 01/2015 Echo: Nl LV fxn, Gr 1 DD, triv AI, mild MR.   Essential hypertension    Hepatic cyst    a. noted on CT 01/2015.   Hyperthyroidism    Multinodular goiter    a. 01/2015 CT chest: multinodular goidter w/ substernal extension of the left lobe of the thyroid assoc w/ rightward deviation of tracheal air column.   Neck pain, chronic    Personal history of colonic polyps    Pulmonary nodules    a. 01/2015 CT Chest: RLL ~ 5mm subpleural nodule - rec f/u in 6-12 mos.   Splenic cyst    a. noted on CT 01/2015.   Past Surgical History:  Procedure Laterality Date   BYPASS GRAFT AORTA TO AORTA Left 11/11/2021   Procedure: BYPASS GRAFT AORTA TO LEFT COMMON  FEMORAL ARTERY;  Surgeon: Leonie Douglas, MD;  Location: [facility] OR;  Service: Vascular;  Laterality: Left;   CERVICAL DISC ARTHROPLASTY N/A 07/05/2019   Procedure: Cervical Six-Seven Artificial disc replacement;  Surgeon: Barnett Abu, MD;  Location: [facility] OR;  Service: Neurosurgery;  Laterality: N/A;  Cervical Six-Seven Artificial disc replacement   CESAREAN SECTION     COLONOSCOPY  01/13/2020   THORACIC AORTIC ENDOVASCULAR STENT GRAFT N/A 11/11/2021   Procedure: THORACIC AORTIC ENDOVASCULAR STENT GRAFT WITH LEFT BRACHIAL  ARTERY ACCESS;  Surgeon: Leonie Douglas, MD;  Location: [facility] OR;  Service: Vascular;  Laterality: N/A;   TONSILLECTOMY     AROUND 5-6 YRS OLD   ULTRASOUND GUIDANCE FOR VASCULAR ACCESS  Bilateral 11/11/2021   Procedure: ULTRASOUND GUIDANCE FOR VASCULAR ACCESS;  Surgeon: Leonie Douglas, MD;  Location: [facility] OR;  Service: Vascular;  Laterality: Bilateral;   UPPER GASTROINTESTINAL ENDOSCOPY  01/13/2020   Patient Active Problem List   Diagnosis Date Noted   Seasonal allergic rhinitis 12/19/2022   Urge incontinence of urine 12/19/2022   Incisional hernia, without obstruction or gangrene 09/26/2022   COPD (chronic obstructive pulmonary disease) (HCC) 11/08/2021   AKI (acute kidney injury) (HCC)    Aortic dissection (HCC) 11/06/2021   Gastroesophageal reflux disease without esophagitis    Hyperthyroidism 04/19/2021   Snoring 03/02/2020   Tobacco use 03/02/2020   Genetic testing 02/10/2020   Family history of lung cancer    History of colonic polyps    Dysphagia 01/06/2020   Cervical spondylosis with myelopathy and radiculopathy 07/05/2019   Neck pain, chronic 05/21/2019   Insomnia 07/27/2018   Multinodular goiter 07/14/2018   Chronic fatigue 06/15/2018   Midsternal chest pain 02/18/2015   Chest pain    Cough    Essential hypertension    Shortness of breath    Aortic stenosis    Pain in the chest    EKG abnormality    Malaise and  fatigue    Lung nodule    Splenic cyst    Hepatic cyst    Hyperlipidemia    Hypertension 02/16/2015    PCP: [place], Betty G, MD  REFERRING PROVIDER: Stechschulte, Hyman Hopes, MD   REFERRING DIAG: [phone] (ICD-10-CM) - Incisional hernia, without obstruction or gangrene  THERAPY DIAG:  No diagnosis found.  Rationale for Evaluation and Treatment: Rehabilitation  ONSET DATE: one year - since leaving hospital last year  SUBJECTIVE:                                                                                                                                                                                           SUBJECTIVE STATEMENT: Pt hasn't checked into pool yet. "I've been in so much pain, I took a prednisone to deal with the  pain".   "I've been looking forward to today".    PAIN:  Are you having pain? Yes NPRS scale: 10/10 Pain location:  low back  Pain type: burning and sharp Pain description: constant   Aggravating factors: standing, sitting, prolonged walking Relieving factors: rest but not too long  PRECAUTIONS: Fall  WEIGHT BEARING RESTRICTIONS: No  FALLS:  Has patient fallen in last 6 months? No  LIVING ENVIRONMENT: Lives with: lives alone   OCCUPATION: healthcare medications shipping  PLOF: Independent  PATIENT GOALS: to have less pain  PERTINENT HISTORY:    HTN, aortic bypass, c-section, chronic fatigue, SOB, Aortic stenosis,takes nitroglycerin for chest pain,  CHF Complications from surgery last year with iliac artery rupture requiring transverse incision in Lt lower quadrant for retroperitoneal exposure of the L iliac vessel for L iliac to common femoral bypass. Developed hernia, consulted for hernia repair, but per pt MD recommended no surgery.   Sexual abuse: No  BOWEL MOVEMENT: Pain with bowel movement: No Type of bowel movement:Type (Bristol Stool Scale) 2-6, Frequency after all meals (within 10 mins), and Strain No Fully empty rectum: Yes:   Leakage: No Pads: No Fiber supplement: No  URINATION: Pain with urination: No Fully empty bladder: Yes:   Stream: Strong and Weak Urgency: Yes:   Frequency: increased every 30-45 mins, 3-4x night Leakage: Urge to void Pads: Yes: most of the time  INTERCOURSE: Pain with intercourse:  not painful  Ability to have vaginal penetration:  Yes:   Climax: not painful Marinoff Scale: 0/3  PREGNANCY: Vaginal deliveries 3 Tearing Yes: full 4th grade tearing with breech baby C-section deliveries 1 Currently pregnant No  PROLAPSE: None   OBJECTIVE:   DIAGNOSTIC FINDINGS:   Oswestry Score: 27 / 50 or 54 % 06/04/23   f/u lumbar spinal stenosis w/  bilat leg pain, L>R, w/ L-spine MRI  transcatheter aortic valve replacement  and bypass grafting  urinary urgency and has she get up to go to the bathroom multiple times a day.  CT 01/04/2023. IMPRESSION: 1. No acute findings or explanation for the patient's symptoms. 2. Mild disc bulging and facet hypertrophy as described without resulting significant spinal stenosis or nerve root encroachment.  COGNITION: Overall cognitive status: Within functional limits for tasks assessed     SENSATION: Light touch: Appears intact Proprioception: Appears intact  MUSCLE LENGTH: Bil hamstrings and adductors limited by 50%  LUMBAR SPECIAL TESTS:  Single leg stance test: 2s Rt 1s lt , SI Compression/distraction test: pain with both, and FABER test: pain bil  TUG:31s with rolling walker  GAIT: Distance walked: 150' Assistive device utilized: Environmental consultant - 4 wheeled Level of assistance: Modified independence Comments: decreased cadence, very forward trunk, decreased stride length, decreased step height bil  POSTURE: rounded shoulders, forward head, anterior pelvic tilt, and flexed trunk    LUMBARAROM/PROM:  A/PROM A/PROM  eval  Flexion Limited  by 75%  Extension Limited  by 50%  Right lateral flexion Limited  by 50%  Left lateral flexion Limited  by 50%  Right rotation Limited  by 75%  Left rotation Limited  by 75%   (Blank rows = not tested)  LOWER EXTREMITY ROM:  WFL LOWER EXTREMITY MMT:  MMT Right eval Left eval  Hip flexion 4 4  Hip extension 3+ 3+  Hip abduction 3 3  Hip adduction 3+ 3+  Hip internal rotation    Hip external rotation    Knee flexion 4 4  Knee extension 4 4  Ankle dorsiflexion    Ankle plantarflexion    Ankle inversion    Ankle eversion     PALPATION:   General  TTP throughout abdomen, tight thoracic and lumbar spine, TTP from T4-L5 worse at mid thoracic range, TTP at PSIS bil                External Perineal Exam deferred today                             Internal Pelvic Floor deferred today  Patient confirms  identification and approves PT to assess internal pelvic floor and treatment No  PELVIC MMT:   MMT eval  Vaginal   Internal Anal Sphincter   External Anal Sphincter   Puborectalis   Diastasis Recti   (Blank rows = not tested)        TONE: Deferred   PROLAPSE: deferred  TODAY'S TREATMENT:                                                                                                                              08/27/23 Pt seen for aquatic therapy today.  Treatment took place in water 3.5-4.75 ft in depth at the [facility] pool. Temp of water  was 91.  Pt entered/exited the pool via stairs independently with bilat rail.  * without support -> holding rainbow hand floats on surface:  walking forward/backward, side stepping R/L  * relaxed squats x 10 - rest in vertical, submerged position * UE on wall: hip ext to toe touch x 10; Hip abdct/ addct x 10 ; heel raises x 10 *return to walking forward/ backward, unsupported x 2 laps * TrA set with short hollow noodle pull down to legs x 10 (challenge) * return to side stepping with UE on hand floats  Pt requires the buoyancy and hydrostatic pressure of water for support, and to offload joints by unweighting joint load by at least 50 % in navel deep water and by at least 75-80% in chest to neck deep water.  Viscosity of the water is needed for resistance of strengthening. Water current perturbations provides challenge to standing balance requiring increased core activation  08/20/23 Pt seen for aquatic therapy today.  Treatment took place in water 3.5-4.75 ft in depth at the [facility] pool. Temp of water was 91.  Pt entered/exited the pool via stairs independently with bilat rail.  * UE on barbell:  walking forward/backward, side stepping R/L  * relaxed squat for rest and recovery * UE on wall: alternating hip ext to toe touch x 10; Hip abdct/ addct x 10 ; heel raises x 10; single leg clam (painful)  *return to walking  forward/ backward, unsupported x 2 laps * TrA set with short hollow noodle pull down to legs x 10 (challenge) * noodle under arms, holding corner:  cycling,  suspended hip abdct/ addct; cycling * solid noodle under arms -> back float at stairs for spine decompression x 1.5 min, CGA to return to standing with cues for head forward and knees flexed  Pt requires the buoyancy and hydrostatic pressure of water for support, and to offload joints by unweighting joint load by at least 50 % in navel deep water and by at least 75-80% in chest to neck deep water.  Viscosity of the water is needed for resistance of strengthening. Water current perturbations provides challenge to standing balance requiring increased core activation.  DATE:  08/06/23  Nustep 6 mins L3  Seated hamstring stretch 3 x 30 sec each LE Seated trunk extension with rows with red serious steel band 2 x 10 Seated chest press with red serious steel band 2 x 10  Mini sit ups with 2 lbs Rows green band 2x10 Marching in place x 20   07/30/23 Pt seen for aquatic therapy today.  Treatment took place in water 3.5-4.75 ft in depth at the [facility] pool. Temp of water was 91.  Pt entered/exited the pool via stairs independently with bilat rail.  * UE on barbell:  walking forward/backward,  * relaxed squat for rest and recovery * UE  on barbell: return to walking forward/ backward x 2, then side stepping R/L x 2 * seated in lift chair:  alternating LAQ x 10; cycling legs (painful on left back); hip abdct/ addct;  TrA set with short hollow noodle pull down to legs x 15 * holding onto wall: heel raises x 10; alternating hip ext x 10; alternating hip abdct/ addct x 10 * solid noodle under arms -> back float for spine decompression x 1 min, x 2 - min A to return to standing with cues for head forward and knees flexed  Pt requires the buoyancy and hydrostatic pressure of water for  support, and to offload joints by unweighting joint  load by at least 50 % in navel deep water and by at least 75-80% in chest to neck deep water.  Viscosity of the water is needed for resistance of strengthening. Water current perturbations provides challenge to standing balance requiring increased core activation.  07/23/23 Pt seen for aquatic therapy today.  Treatment took place in water 3.5-4.75 ft in depth at the [facility] pool. Temp of water was 91.  Pt entered/exited the pool via stairs independently with bilat rail.  -submerged to chest deep water *introduction to aquatic therapy principles and properties * UE on barbell:  walking forward/backward, multiple laps with cues for increased step length and to widen BOS;  side stepping R/L  * relaxed squat for rest and recovery * holding onto wall: heel raises x 10; alternating hip ext x 10; alternating hip abdct/ addct x 10 * high knee marching forward and backward with row motion of arms - 3 laps   Pt requires the buoyancy and hydrostatic pressure of water for support, and to offload joints by unweighting joint load by at least 50 % in navel deep water and by at least 75-80% in chest to neck deep water.  Viscosity of the water is needed for resistance of strengthening. Water current perturbations provides challenge to standing balance requiring increased core activation.    PATIENT EDUCATION:  Education details: aquatic exercise progressions / modifications  Person educated: Patient Education method: Explanation, Demonstration, Tactile cues, Verbal cues Education comprehension: verbalized understanding and returned demonstration  HOME EXERCISE PROGRAM: Access Code: [email or web address] URL: [email or web address] Date: 03/20/2023 Prepared by: Clydie Braun Menke Updated 05/21/23 - HALEY   Exercises - Seated Pelvic Floor Contraction with Isometric Hip Adduction  - 1 x daily - 7 x weekly - 2 sets - 10 reps - Seated Transversus Abdominis Bracing  - 1 x daily - 7 x weekly - 2 sets  - 10 reps - Seated Pelvic Floor Contraction with Hip Abduction and Resistance Loop  - 1 x daily - 7 x weekly - 2 sets - 10 reps - Seated Hamstring Stretch  - 1 x daily - 7 x weekly - 1 sets - 2 reps - 20 sec hold - Sit to Stand  - 1 x daily - 7 x weekly - 2 sets - 5 reps  ASSESSMENT:  CLINICAL IMPRESSION:  Pt tolerated aquatic exercises well and reported reduction of pain to 4-5/10.  She requires frequent rest breaks, while submerged 80% for decompression.  Plan to create aquatic HEP at next visit at pool. Encouraged pt to check local pools and join one if able, as she gets significant relief while in water. After joining she can begin with basic water walking until HEP issued. She would benefit from continued skilled PT for core strength and LE flexibility.      OBJECTIVE IMPAIRMENTS: decreased activity tolerance, decreased coordination, decreased endurance, decreased mobility, difficulty walking, decreased strength, increased fascial restrictions, increased muscle spasms, impaired flexibility, improper body mechanics, postural dysfunction, and pain.   ACTIVITY LIMITATIONS: carrying, lifting, bending, sitting, standing, squatting, transfers, and locomotion level  PARTICIPATION LIMITATIONS: driving, shopping, community activity, and yard work  PERSONAL FACTORS: Time since onset of injury/illness/exacerbation and 1 comorbidity: medical history  are also affecting patient's functional outcome.   REHAB POTENTIAL: Good  CLINICAL DECISION MAKING: Stable/uncomplicated  EVALUATION COMPLEXITY: Low   GOALS: Goals reviewed with patient? Yes  SHORT TERM GOALS: Target date: 03/26/23  Pt to be I with HEP.  Baseline: Goal status:MET  2.  Pt to demonstrate at least 3/5 pelvic floor strength for improved pelvic stability and decreased strain at pelvic floor/ decrease leakage.  Baseline:  Goal status: deferred   3.  Pt will have 25% less urgency due to bladder retraining and strengthening   Baseline:  Goal status: MET  4.  Pt to demonstrate at least 4/5 bil hip strength for improved pelvic stability and functional squats without leakage.  Baseline:  Goal status: MET   LONG TERM GOALS: Target date: 09/04/23  Pt to be I with advanced HEP.  Baseline:  Goal status: on going  2.  Pt to demonstrate at least 5/5 bil hip strength for improved pelvic stability and functional squats without leakage.  Baseline:  Goal status: on going  3.  Pt will have 50% less urgency due to bladder retraining and strengthening  Baseline:  Goal status: on going  4.  Pt to demonstrate improved gait mechanics with improved cadence and posture for at least 250' with LRAD at mod I and min cues for improved mobility at home and for community activity.  Baseline:  Goal status: on going  5.  Pt to complete x10 squats with at least 10# for improved mobility and strength at hips for improved gait and transfers for decreased fall risk.  Baseline:  Goal status: on going   6. PT to demonstrate improved TUG test to no more than 20s with rolling walker for decreased fall risk Baseline:  Goal status: MET - 11s  7. Pt to score at least 25% better on [place] for improved tolerance to standing and activity. Baseline:  Goal status: NEW  8. Pt to report ability to stand with/out LRAD for at least 30 mins with minimal increase/no increase in pain for improved tolerance to working.   Baseline:  Goal status: NEW PLAN:  PT FREQUENCY: 1-2x/week  PT DURATION:  12 sessions  PLANNED INTERVENTIONS: Therapeutic exercises, Therapeutic activity, Neuromuscular re-education, Balance training, Gait training, Patient/Family education, Self Care, Joint mobilization, DME instructions, Aquatic Therapy, Dry Needling, Spinal mobilization, Cryotherapy, Moist heat, scar mobilization, Taping, Biofeedback, and Manual therapy  PLAN FOR NEXT SESSION: Continue hip, core, pelvic floor strengthening, activity tolerance, mobility  at spine and hips  Mayer Camel, PTA 08/27/23 5:48 PM [facility] [address] [place], [place], [address] Phone: [phone]   Fax:  [phone]

## 2023-09-03 ENCOUNTER — Ambulatory Visit: Payer: BC Managed Care – PPO

## 2023-09-07 ENCOUNTER — Telehealth: Payer: Self-pay | Admitting: Family Medicine

## 2023-09-07 DIAGNOSIS — G8929 Other chronic pain: Secondary | ICD-10-CM

## 2023-09-07 NOTE — Telephone Encounter (Signed)
Pt stopped by the office. Dropped off FMLA ppwk that pt states was due last Friday.  Also requesting another mid back epidural ( not lower back ). Unsure if OV needed for this order.

## 2023-09-07 NOTE — Telephone Encounter (Signed)
ESI repeated. I will do the paperwork when I can

## 2023-09-11 ENCOUNTER — Telehealth: Payer: Self-pay

## 2023-09-11 ENCOUNTER — Ambulatory Visit: Payer: BC Managed Care – PPO | Attending: Surgery

## 2023-09-11 DIAGNOSIS — R293 Abnormal posture: Secondary | ICD-10-CM | POA: Insufficient documentation

## 2023-09-11 DIAGNOSIS — M62830 Muscle spasm of back: Secondary | ICD-10-CM | POA: Insufficient documentation

## 2023-09-11 DIAGNOSIS — M545 Low back pain, unspecified: Secondary | ICD-10-CM | POA: Insufficient documentation

## 2023-09-11 DIAGNOSIS — M6281 Muscle weakness (generalized): Secondary | ICD-10-CM | POA: Insufficient documentation

## 2023-09-11 DIAGNOSIS — R262 Difficulty in walking, not elsewhere classified: Secondary | ICD-10-CM | POA: Insufficient documentation

## 2023-09-11 DIAGNOSIS — R252 Cramp and spasm: Secondary | ICD-10-CM | POA: Insufficient documentation

## 2023-09-11 DIAGNOSIS — M546 Pain in thoracic spine: Secondary | ICD-10-CM | POA: Insufficient documentation

## 2023-09-11 NOTE — Telephone Encounter (Signed)
Placed call to inform patient of her missed visit this morning at 8:45 am.  Informed her that she will need to come here for a re-assessment visit before continuing at the pool.  Provided phone number for patient to call and let us know of her plans.  Also [employer] PT of patient's missed visit.

## 2023-09-14 NOTE — Telephone Encounter (Signed)
Form completed and placed on Dr. Corey's desk to review and sign.  

## 2023-09-15 NOTE — Telephone Encounter (Signed)
Form reviewed and signed by Dr. Denyse Amass and placed at the front desk for faxing/scanning.

## 2023-09-28 ENCOUNTER — Encounter: Payer: Self-pay | Admitting: Family Medicine

## 2023-09-29 NOTE — Discharge Instructions (Signed)

## 2023-09-30 ENCOUNTER — Inpatient Hospital Stay
Admission: RE | Admit: 2023-09-30 | Discharge: 2023-09-30 | Disposition: A | Discharge status: Home / Self Care | Payer: BC Managed Care – PPO | Source: Ambulatory Visit | Attending: Family Medicine | Admitting: Family Medicine

## 2023-09-30 ENCOUNTER — Ambulatory Visit
Admission: RE | Admit: 2023-09-30 | Discharge: 2023-09-30 | Disposition: A | Discharge status: Home / Self Care | Payer: BC Managed Care – PPO | Source: Ambulatory Visit | Attending: Family Medicine | Admitting: Family Medicine

## 2023-09-30 DIAGNOSIS — M546 Pain in thoracic spine: Secondary | ICD-10-CM | POA: Diagnosis not present

## 2023-09-30 DIAGNOSIS — G8929 Other chronic pain: Secondary | ICD-10-CM

## 2023-09-30 MED ORDER — IOPAMIDOL (ISOVUE-M 200) INJECTION 41%
1.0000 mL | Freq: Once | INTRAMUSCULAR | Status: AC
  Administered 2023-09-30: 1 mL via EPIDURAL

## 2023-09-30 MED ORDER — METHYLPREDNISOLONE ACETATE 40 MG/ML INJ SUSP (RADIOLOG
80.0000 mg | Freq: Once | INTRAMUSCULAR | Status: AC
  Administered 2023-09-30: 80 mg via EPIDURAL

## 2023-09-30 NOTE — Discharge Instructions (Signed)

## 2023-10-01 ENCOUNTER — Ambulatory Visit (HOSPITAL_BASED_OUTPATIENT_CLINIC_OR_DEPARTMENT_OTHER): Payer: BC Managed Care – PPO | Admitting: Physical Therapy

## 2023-10-07 ENCOUNTER — Ambulatory Visit: Payer: BC Managed Care – PPO | Attending: Surgery | Admitting: Physical Therapy

## 2023-10-07 ENCOUNTER — Encounter: Payer: Self-pay | Admitting: Physical Therapy

## 2023-10-07 DIAGNOSIS — R252 Cramp and spasm: Secondary | ICD-10-CM | POA: Insufficient documentation

## 2023-10-07 DIAGNOSIS — M62830 Muscle spasm of back: Secondary | ICD-10-CM | POA: Diagnosis not present

## 2023-10-07 DIAGNOSIS — M545 Low back pain, unspecified: Secondary | ICD-10-CM | POA: Diagnosis not present

## 2023-10-07 DIAGNOSIS — M546 Pain in thoracic spine: Secondary | ICD-10-CM | POA: Diagnosis not present

## 2023-10-07 NOTE — Therapy (Addendum)
 OUTPATIENT PHYSICAL THERAPY FEMALE PELVIC TREATMENT NOTE/ RE-CERTIFICATION / RE-EVALUTATION   Patient Name: Darlene Griffith MRN: [id number] DOB:[birth year], 72 y.o., female Today's Date: 10/07/2023  END OF SESSION:  PT End of Session - 10/07/23 1438     Visit Number 15    Date for PT Re-Evaluation 12/02/23    Authorization Type BC[employer]    PT Start Time 1401    PT Stop Time 1428   patient had to leave appointment early d/t her transportation being on the way   PT Time Calculation (min) 27 min    Activity Tolerance Patient limited by pain    Behavior During Therapy [facility] for tasks assessed/performed                  Past Medical History:  Diagnosis Date   Chest pain    a. 01/2015 Echo: Nl LV fxn, Gr 1 DD, triv AI, mild MR.   Essential hypertension    Hepatic cyst    a. noted on CT 01/2015.   Hyperthyroidism    Multinodular goiter    a. 01/2015 CT chest: multinodular goidter w/ substernal extension of the left lobe of the thyroid assoc w/ rightward deviation of tracheal air column.   Neck pain, chronic    Personal history of colonic polyps    Pulmonary nodules    a. 01/2015 CT Chest: RLL ~ 5mm subpleural nodule - rec f/u in 6-12 mos.   Splenic cyst    a. noted on CT 01/2015.   Past Surgical History:  Procedure Laterality Date   BYPASS GRAFT AORTA TO AORTA Left 11/11/2021   Procedure: BYPASS GRAFT AORTA TO LEFT COMMON  FEMORAL ARTERY;  Surgeon: Leonie Douglas, MD;  Location: [facility] OR;  Service: Vascular;  Laterality: Left;   CERVICAL DISC ARTHROPLASTY N/A 07/05/2019   Procedure: Cervical Six-Seven Artificial disc replacement;  Surgeon: Barnett Abu, MD;  Location: [facility] OR;  Service: Neurosurgery;  Laterality: N/A;  Cervical Six-Seven Artificial disc replacement   CESAREAN SECTION     COLONOSCOPY  01/13/2020   THORACIC AORTIC ENDOVASCULAR STENT GRAFT N/A 11/11/2021   Procedure: THORACIC AORTIC ENDOVASCULAR STENT GRAFT WITH LEFT BRACHIAL  ARTERY ACCESS;  Surgeon: Leonie Douglas,  MD;  Location: [facility] OR;  Service: Vascular;  Laterality: N/A;   TONSILLECTOMY     AROUND 5-6 YRS OLD   ULTRASOUND GUIDANCE FOR VASCULAR ACCESS Bilateral 11/11/2021   Procedure: ULTRASOUND GUIDANCE FOR VASCULAR ACCESS;  Surgeon: Leonie Douglas, MD;  Location: [facility] OR;  Service: Vascular;  Laterality: Bilateral;   UPPER GASTROINTESTINAL ENDOSCOPY  01/13/2020   Patient Active Problem List   Diagnosis Date Noted   Seasonal allergic rhinitis 12/19/2022   Urge incontinence of urine 12/19/2022   Incisional hernia, without obstruction or gangrene 09/26/2022   COPD (chronic obstructive pulmonary disease) (HCC) 11/08/2021   AKI (acute kidney injury) (HCC)    Aortic dissection (HCC) 11/06/2021   Gastroesophageal reflux disease without esophagitis    Hyperthyroidism 04/19/2021   Snoring 03/02/2020   Tobacco use 03/02/2020   Genetic testing 02/10/2020   Family history of lung cancer    History of colonic polyps    Dysphagia 01/06/2020   Cervical spondylosis with myelopathy and radiculopathy 07/05/2019   Neck pain, chronic 05/21/2019   Insomnia 07/27/2018   Multinodular goiter 07/14/2018   Chronic fatigue 06/15/2018   Midsternal chest pain 02/18/2015   Chest pain    Cough    Essential hypertension    Shortness of breath  Aortic stenosis    Pain in the chest    EKG abnormality    Malaise and fatigue    Lung nodule    Splenic cyst    Hepatic cyst    Hyperlipidemia    Hypertension 02/16/2015    PCP: [place], Betty G, MD  REFERRING PROVIDER: Stechschulte, Hyman Hopes, MD   REFERRING DIAG: [phone] (ICD-10-CM) - Incisional hernia, without obstruction or gangrene  THERAPY DIAG:  Muscle spasm of back  Acute bilateral low back pain without sciatica  Pain in thoracic spine  Cramp and spasm  Rationale for Evaluation and Treatment: Rehabilitation  ONSET DATE: one year - since leaving hospital last year  SUBJECTIVE:                                                                                                                                                                                            SUBJECTIVE STATEMENT: Patient reports she is really hurting today. She really enjoys going to the pool and she feels she is able to do more in the water than on land.  PAIN: 10/07/2023 Are you having pain? Yes NPRS scale: 8/10 Pain location:  low back  Pain type: burning and sharp Pain description: constant   Aggravating factors: standing, sitting, prolonged walking Relieving factors: rest but not too long  PRECAUTIONS: Fall  WEIGHT BEARING RESTRICTIONS: No  FALLS:  Has patient fallen in last 6 months? No  LIVING ENVIRONMENT: Lives with: lives alone   OCCUPATION: healthcare medications shipping  PLOF: Independent  PATIENT GOALS: to have less pain  PERTINENT HISTORY:    HTN, aortic bypass, c-section, chronic fatigue, SOB, Aortic stenosis,takes nitroglycerin for chest pain,  CHF Complications from surgery last year with iliac artery rupture requiring transverse incision in Lt lower quadrant for retroperitoneal exposure of the L iliac vessel for L iliac to common femoral bypass. Developed hernia, consulted for hernia repair, but per pt MD recommended no surgery.   Sexual abuse: No  BOWEL MOVEMENT: Pain with bowel movement: No Type of bowel movement:Type (Bristol Stool Scale) 2-6, Frequency after all meals (within 10 mins), and Strain No Fully empty rectum: Yes:   Leakage: No Pads: No Fiber supplement: No  URINATION: Pain with urination: No Fully empty bladder: Yes:   Stream: Strong and Weak Urgency: Yes:   Frequency: increased every 30-45 mins, 3-4x night Leakage: Urge to void Pads: Yes: most of the time  INTERCOURSE: Pain with intercourse:  not painful  Ability to have vaginal penetration:  Yes:   Climax: not painful Marinoff Scale: 0/3  PREGNANCY: Vaginal deliveries 3 Tearing Yes: full 4th grade tearing with breech  baby C-section deliveries  1 Currently pregnant No  PROLAPSE: None   OBJECTIVE:   DIAGNOSTIC FINDINGS:   Oswestry Score: 27 / 50 or 54 % 06/04/23   f/u lumbar spinal stenosis w/ bilat leg pain, L>R, w/ L-spine MRI  transcatheter aortic valve replacement and bypass grafting  urinary urgency and has she get up to go to the bathroom multiple times a day.  CT 01/04/2023. IMPRESSION: 1. No acute findings or explanation for the patient's symptoms. 2. Mild disc bulging and facet hypertrophy as described without resulting significant spinal stenosis or nerve root encroachment.   COGNITION: Overall cognitive status: Within functional limits for tasks assessed     SENSATION: Light touch: Appears intact Proprioception: Appears intact  MUSCLE LENGTH: Bil hamstrings and adductors limited by 50%  LUMBAR SPECIAL TESTS:  Single leg stance test: 2s Rt 1s lt , SI Compression/distraction test: pain with both, and FABER test: pain bil  TUG:31s with rolling walker   10/07/2023 TUG: 50 sec no AD 5STS: 1.108min  GAIT: Distance walked: 150' Assistive device utilized: Environmental consultant - 4 wheeled Level of assistance: Modified independence Comments: decreased cadence, very forward trunk, decreased stride length, decreased step height bil  POSTURE: rounded shoulders, forward head, anterior pelvic tilt, and flexed trunk    LUMBARAROM/PROM:  A/PROM A/PROM  eval  Flexion Limited  by 75%  Extension Limited  by 50%  Right lateral flexion Limited  by 50%  Left lateral flexion Limited  by 50%  Right rotation Limited  by 75%  Left rotation Limited  by 75%   (Blank rows = not tested)  LOWER EXTREMITY ROM:  WFL LOWER EXTREMITY MMT:  MMT Right eval Left eval  Hip flexion 4 4  Hip extension 3+ 3+  Hip abduction 3 3  Hip adduction 3+ 3+  Hip internal rotation    Hip external rotation    Knee flexion 4 4  Knee extension 4 4  Ankle dorsiflexion    Ankle plantarflexion    Ankle inversion    Ankle eversion      PALPATION:   General  TTP throughout abdomen, tight thoracic and lumbar spine, TTP from T4-L5 worse at mid thoracic range, TTP at PSIS bil                External Perineal Exam deferred today                             Internal Pelvic Floor deferred today  Patient confirms identification and approves PT to assess internal pelvic floor and treatment No  PELVIC MMT:   MMT eval  Vaginal   Internal Anal Sphincter   External Anal Sphincter   Puborectalis   Diastasis Recti   (Blank rows = not tested)        TONE: Deferred   PROLAPSE: deferred  TODAY'S TREATMENT:  10/07/23 NuStep Level 1 3 mins- PT present to discuss status Discussion of POC Patient got a call that her ride was near so treatment session was cut short so she could schedule more appointments  08/27/23 Pt seen for aquatic therapy today.  Treatment took place in water 3.5-4.75 ft in depth at the [facility] pool. Temp of water was 91.  Pt entered/exited the pool via stairs independently with bilat rail.  * without support -> holding rainbow hand floats on surface:  walking forward/backward, side stepping R/L  * relaxed squats x 10 - rest in vertical, submerged position * UE on wall: hip ext to toe touch x 10; Hip abdct/ addct x 10 ; heel raises x 10 *return to walking forward/ backward, unsupported x 2 laps * TrA set with short hollow noodle pull down to legs x 10 (challenge) * return to side stepping with UE on hand floats  Pt requires the buoyancy and hydrostatic pressure of water for support, and to offload joints by unweighting joint load by at least 50 % in navel deep water and by at least 75-80% in chest to neck deep water.  Viscosity of the water is needed for resistance of strengthening. Water current perturbations provides challenge to standing balance requiring increased  core activation  08/20/23 Pt seen for aquatic therapy today.  Treatment took place in water 3.5-4.75 ft in depth at the [facility] pool. Temp of water was 91.  Pt entered/exited the pool via stairs independently with bilat rail.  * UE on barbell:  walking forward/backward, side stepping R/L  * relaxed squat for rest and recovery * UE on wall: alternating hip ext to toe touch x 10; Hip abdct/ addct x 10 ; heel raises x 10; single leg clam (painful)  *return to walking forward/ backward, unsupported x 2 laps * TrA set with short hollow noodle pull down to legs x 10 (challenge) * noodle under arms, holding corner:  cycling,  suspended hip abdct/ addct; cycling * solid noodle under arms -> back float at stairs for spine decompression x 1.5 min, CGA to return to standing with cues for head forward and knees flexed  Pt requires the buoyancy and hydrostatic pressure of water for support, and to offload joints by unweighting joint load by at least 50 % in navel deep water and by at least 75-80% in chest to neck deep water.  Viscosity of the water is needed for resistance of strengthening. Water current perturbations provides challenge to standing balance requiring increased core activation.  DATE:  08/06/23  Nustep 6 mins L3  Seated hamstring stretch 3 x 30 sec each LE Seated trunk extension with rows with red serious steel band 2 x 10 Seated chest press with red serious steel band 2 x 10  Mini sit ups with 2 lbs Rows green band 2x10 Marching in place x 20    PATIENT EDUCATION:  Education details: aquatic exercise progressions / modifications  Person educated: Patient Education method: Explanation, Demonstration, Actor cues, Verbal cues Education comprehension: verbalized understanding and returned demonstration  HOME EXERCISE PROGRAM: Access Code: [email or web address] URL: [email or web address] Date: 03/20/2023 Prepared by: Clydie Braun Menke Updated 05/21/23 - Darlene Griffith    Exercises - Seated Pelvic Floor Contraction with Isometric Hip Adduction  - 1 x daily - 7 x weekly - 2 sets - 10 reps - Seated Transversus Abdominis Bracing  - 1 x daily - 7 x weekly - 2 sets - 10 reps - Seated Pelvic Floor Contraction with Hip  Abduction and Resistance Loop  - 1 x daily - 7 x weekly - 2 sets - 10 reps - Seated Hamstring Stretch  - 1 x daily - 7 x weekly - 1 sets - 2 reps - 20 sec hold - Sit to Stand  - 1 x daily - 7 x weekly - 2 sets - 5 reps  ASSESSMENT:  CLINICAL IMPRESSION: Darlene Griffith presents to therapy with increased back pain that is limiting her functional mobility. Since starting aquatic therapy she feels she is able to have improvements in her pain level after aquatic sessions. Her standing tolerance is only 2-3 minutes due to her back pain. Making a bed, cooking meals, and completing her job duties are challenging for her. The aquatic environment allows her more functional mobility without increased pain. Due to Darlene Griffith's work schedule she is only able to make it to aquatics once a week. She verbalized that she will come to appointments because she wants to get better. Patient will benefit from skilled PT to address the below impairments and improve overall function.    OBJECTIVE IMPAIRMENTS: decreased activity tolerance, decreased coordination, decreased endurance, decreased mobility, difficulty walking, decreased strength, increased fascial restrictions, increased muscle spasms, impaired flexibility, improper body mechanics, postural dysfunction, and pain.   ACTIVITY LIMITATIONS: carrying, lifting, bending, sitting, standing, squatting, transfers, and locomotion level  PARTICIPATION LIMITATIONS: driving, shopping, community activity, and yard work  PERSONAL FACTORS: Time since onset of injury/illness/exacerbation and 1 comorbidity: medical history  are also affecting patient's functional outcome.   REHAB POTENTIAL: Good  CLINICAL DECISION MAKING:  Stable/uncomplicated  EVALUATION COMPLEXITY: Low   GOALS: Goals reviewed with patient? Yes  SHORT TERM GOALS: Target date: 03/26/23  Pt to be I with HEP.  Baseline: Goal status:MET  2.  Pt to demonstrate at least 3/5 pelvic floor strength for improved pelvic stability and decreased strain at pelvic floor/ decrease leakage.  Baseline:  Goal status: deferred   3.  Pt will have 25% less urgency due to bladder retraining and strengthening  Baseline:  Goal status: MET  4.  Pt to demonstrate at least 4/5 bil hip strength for improved pelvic stability and functional squats without leakage.  Baseline:  Goal status: MET   LONG TERM GOALS: Target date: 12/02/2023   Pt to be I with advanced HEP.  Baseline:  Goal status: In Progress 10/07/2023  2.  Pt to demonstrate at least 5/5 bil hip strength for improved pelvic stability and functional squats without leakage.  Baseline:  Goal status: In Progress 10/07/2023  3.  Pt will have 50% less urgency due to bladder retraining and strengthening  Baseline:  Goal status: In Progress 10/07/2023  4.  Pt will be able to make her bed with < or = to 3/10 back pain. Baseline:  Goal status: NEW   5.  Pt to complete 5STS in < or = to 45 sec for improved functional mobility. Baseline:  Goal status: NEW   6. PT to demonstrate improved TUG test to no more than 20s with rolling walker for decreased fall risk Baseline:  Goal status: MET - 11s  7. Pt to score at least 25% better on Owestry for improved tolerance to standing and activity. Baseline:  Goal status: In Progress 10/07/2023  8. Pt to report ability to stand with/out LRAD for at least 10 mins with minimal increase/no increase in pain for improved tolerance to working.   Baseline: 2-3 minutes Goal status: In Progress 10/07/2023 PLAN:  PT FREQUENCY: 1x/week  PT  DURATION: 8 weeks  PLANNED INTERVENTIONS: 97110-Therapeutic exercises, 97530- Therapeutic activity, 97112- Neuromuscular  re-education, [phone]- Self Care, 60454- Manual therapy, [phone]- Gait training, [phone]- Aquatic Therapy, Patient/Family education, Balance training, Taping, Dry Needling, Joint mobilization, Spinal mobilization, Scar mobilization, DME instructions, Cryotherapy, Moist heat, and Biofeedback  PLAN FOR NEXT SESSION: Continue hip, core, pelvic floor strengthening, activity tolerance, mobility at spine and hips   PHYSICAL THERAPY DISCHARGE SUMMARY  Visits from Start of Care: 15  Patient agrees to discharge. Patient goals were partially met. Patient is being discharged due to not returning since the last visit.  Claude Manges, PT 12/03/23 2:58 PM

## 2023-10-08 ENCOUNTER — Ambulatory Visit (HOSPITAL_BASED_OUTPATIENT_CLINIC_OR_DEPARTMENT_OTHER): Payer: BC Managed Care – PPO | Attending: Family Medicine | Admitting: Physical Therapy

## 2023-10-15 ENCOUNTER — Ambulatory Visit (HOSPITAL_BASED_OUTPATIENT_CLINIC_OR_DEPARTMENT_OTHER): Payer: BC Managed Care – PPO | Attending: Family Medicine | Admitting: Physical Therapy

## 2023-10-15 ENCOUNTER — Telehealth (HOSPITAL_BASED_OUTPATIENT_CLINIC_OR_DEPARTMENT_OTHER): Payer: Self-pay | Admitting: Physical Therapy

## 2023-10-15 DIAGNOSIS — M62830 Muscle spasm of back: Secondary | ICD-10-CM | POA: Insufficient documentation

## 2023-10-15 DIAGNOSIS — M546 Pain in thoracic spine: Secondary | ICD-10-CM | POA: Insufficient documentation

## 2023-10-15 DIAGNOSIS — M545 Low back pain, unspecified: Secondary | ICD-10-CM | POA: Insufficient documentation

## 2023-10-15 DIAGNOSIS — R252 Cramp and spasm: Secondary | ICD-10-CM | POA: Insufficient documentation

## 2023-10-15 NOTE — Telephone Encounter (Signed)
Patient did not show for aquatic PT appointment.  Called patient, but there was no answer and her voicemail box was full.    If patient is interested in continuation of aquatic therapy prior to expiration of her plan of care (12/02/23), she will need to call to schedule appointments. Phone: [phone]     Mayer Camel, PTA 10/15/23 4:06 PM [facility] [address] [place], [place], [address]

## 2023-10-29 ENCOUNTER — Other Ambulatory Visit: Payer: Self-pay | Admitting: Family Medicine

## 2023-10-29 ENCOUNTER — Telehealth: Payer: Self-pay | Admitting: *Deleted

## 2023-10-29 ENCOUNTER — Ambulatory Visit: Payer: Self-pay | Admitting: Family Medicine

## 2023-10-29 MED ORDER — AMLODIPINE BESYLATE 10 MG PO TABS: 10.0000 mg | ORAL_TABLET | Freq: Every day | ORAL | 0 refills | Status: DC

## 2023-10-29 NOTE — Telephone Encounter (Signed)
 Called patient unable to leave VM, patient needs a Annual OV, sent in amlodipine to patient's pharmacy

## 2023-10-29 NOTE — Telephone Encounter (Signed)
 Pt is aware rx can take up to 3 business day and md is not in office today

## 2023-10-29 NOTE — Telephone Encounter (Addendum)
 Pt does not want to speak to writer about her BP.   Chief Complaint: needs refill for medication  Disposition: [] ED /[] Urgent Care (no appt availability in office) / [] Appointment(In office/virtual)/ []  [place] [facility]/ [] Home Care/ [] Refused Recommended Disposition /[] [place] [facility]/ [x]  Follow-up with PCP  Additional Notes: Pt states she does not want to tell me about her current BP because you will just tell me to go to the emergency, I am not going to the emergency, people die there, they make you wait. Pt states at this time she is anxious because the weather will be bad and she is dependent on others getting her to/from the pharmacy.  She needs the rx filled today or she won't be able to get to the pharmacy until next week. Pt advised that if her BP is over 160 systolic or 100 diastolic that she needs to go to the ED. Pt states she understands the parameters. Advised RN will reach out to clinic to escalate.   Copied from CRM [id number]. Topic: Clinical - Red Word Triage >> Oct 29, 2023 10:24 AM Maisie BROCKS wrote: Red Word that prompted transfer to Nurse Triage: pt called and stated that she was extremely worried about not being able to get her blood pressure medication refilled today because she explained that If it's called in tomorrow she will not have transportation to get it from the pharmacy. She mentioned that she spent 2 months in the hospital last time when she couldn't get it refilled. She stated her blood pressure was increasing and it read 182/140. She asked to speak with a nurse as soon as possible.  Reason for Disposition  Ran out of BP medications  Answer Assessment - Initial Assessment Questions 1. BLOOD PRESSURE: What is the blood pressure? Did you take at least two measurements 5 minutes apart?     I do not want to tell you, because you'll tell me to go to the emergency, and I am not sitting there all day, miserable 4. HISTORY: Do you have a history of high  blood pressure?     Hx of HTN 5. MEDICINES: Are you taking any medicines for blood pressure? Have you missed any doses recently?     Amlodipine , but thought I had refills, but I don't 6. OTHER SYMPTOMS: Do you have any symptoms? (e.g., blurred vision, chest pain, difficulty breathing, headache, weakness)     Just anxiety at this point.  Protocols used: Blood Pressure - High-A-AH

## 2023-10-29 NOTE — Telephone Encounter (Signed)
 Copied from CRM [id number]. Topic: Clinical - Medication Refill >> Oct 29, 2023 10:20 AM Maisie BROCKS wrote: Most Recent Primary Care Visit:  Provider: JORDAN, BETTY G  Department: [facility]  Visit Type: PHYSICAL  Date: 12/19/2022  Medication: amLODipine  (NORVASC ) 10 MG tablet  Has the patient contacted their pharmacy? Yes She asked for another message to be sent back because she is worried about the chance of possible snow tomorrow and she may not be able to get it .   Is this the correct pharmacy for this prescription? Yes  This is the patient's preferred pharmacy:  [facility] [address] ([address]), [place] - [address] [place] ([place]) [place] [address] Phone: [phone] Fax: [phone]  Has the prescription been filled recently? No  Is the patient out of the medication? Yes  Has the patient been seen for an appointment in the last year OR does the patient have an upcoming appointment? No  Can we respond through MyChart? No  Agent: Please be advised that Rx refills may take up to 3 business days. We ask that you follow-up with your pharmacy.

## 2023-11-13 ENCOUNTER — Emergency Department (HOSPITAL_BASED_OUTPATIENT_CLINIC_OR_DEPARTMENT_OTHER): Payer: BC Managed Care – PPO

## 2023-11-13 ENCOUNTER — Other Ambulatory Visit: Payer: Self-pay

## 2023-11-13 ENCOUNTER — Encounter (HOSPITAL_BASED_OUTPATIENT_CLINIC_OR_DEPARTMENT_OTHER): Payer: Self-pay | Admitting: Emergency Medicine

## 2023-11-13 ENCOUNTER — Emergency Department (HOSPITAL_BASED_OUTPATIENT_CLINIC_OR_DEPARTMENT_OTHER)
Admission: EM | Admit: 2023-11-13 | Discharge: 2023-11-13 | Disposition: A | Discharge status: Home / Self Care | Payer: BC Managed Care – PPO | Attending: Emergency Medicine | Admitting: Emergency Medicine

## 2023-11-13 DIAGNOSIS — M47812 Spondylosis without myelopathy or radiculopathy, cervical region: Secondary | ICD-10-CM | POA: Diagnosis not present

## 2023-11-13 DIAGNOSIS — M542 Cervicalgia: Secondary | ICD-10-CM | POA: Diagnosis not present

## 2023-11-13 DIAGNOSIS — I10 Essential (primary) hypertension: Secondary | ICD-10-CM | POA: Insufficient documentation

## 2023-11-13 DIAGNOSIS — Z79899 Other long term (current) drug therapy: Secondary | ICD-10-CM | POA: Insufficient documentation

## 2023-11-13 DIAGNOSIS — R519 Headache, unspecified: Secondary | ICD-10-CM | POA: Insufficient documentation

## 2023-11-13 DIAGNOSIS — E049 Nontoxic goiter, unspecified: Secondary | ICD-10-CM | POA: Diagnosis not present

## 2023-11-13 MED ORDER — KETOROLAC TROMETHAMINE 15 MG/ML IJ SOLN
15.0000 mg | Freq: Once | INTRAMUSCULAR | Status: AC
  Administered 2023-11-13: 15 mg via INTRAMUSCULAR
  Filled 2023-11-13: qty 1

## 2023-11-13 MED ORDER — METHOCARBAMOL 500 MG PO TABS
500.0000 mg | ORAL_TABLET | Freq: Once | ORAL | Status: AC
  Administered 2023-11-13: 500 mg via ORAL
  Filled 2023-11-13: qty 1

## 2023-11-13 MED ORDER — ACETAMINOPHEN 500 MG PO TABS
1000.0000 mg | ORAL_TABLET | Freq: Once | ORAL | Status: AC
  Administered 2023-11-13: 1000 mg via ORAL
  Filled 2023-11-13: qty 2

## 2023-11-13 MED ORDER — METHOCARBAMOL 500 MG PO TABS: 500.0000 mg | ORAL_TABLET | Freq: Two times a day (BID) | ORAL | 0 refills | Status: AC | PRN

## 2023-11-13 MED ORDER — METOCLOPRAMIDE HCL 10 MG PO TABS
10.0000 mg | ORAL_TABLET | Freq: Once | ORAL | Status: AC
  Administered 2023-11-13: 10 mg via ORAL
  Filled 2023-11-13: qty 1

## 2023-11-13 MED ORDER — SODIUM CHLORIDE 0.9 % IV BOLUS: 500.0000 mL | Freq: Once | INTRAVENOUS | Status: DC

## 2023-11-13 MED ORDER — METOCLOPRAMIDE HCL 5 MG/ML IJ SOLN: 10.0000 mg | Freq: Once | INTRAMUSCULAR | Status: DC

## 2023-11-13 NOTE — ED Notes (Signed)
Patient and family member provide with drinks per their request

## 2023-11-13 NOTE — ED Triage Notes (Signed)
Neck pain,into left shoulder, into back of head. Started last week with headache  But pain in neck worse last night Not relieved with home dose medications

## 2023-11-13 NOTE — Discharge Instructions (Signed)
Your CT scan today was reassuring.  You do have a very large thyroid gland that you need to follow-up with your primary care doctor about to further evaluate as you may need additional imaging to determine the cause of your enlarged thyroid gland.  Your pain is likely due to muscular pain.  You can take the prescribed muscle relaxer but be careful with this as it may make you drowsy.  You should not drive while taking this.  If you develop severe headache, weakness, numbness, fever or any other new concerning symptoms you should return to the ED.

## 2023-11-13 NOTE — ED Provider Notes (Signed)
[place] [facility]ERGENC[facility] DEPARTMENT AT [facility] Provider Note   CSN: [id number] Arrival date & time: 11/13/23  1911     History  Chief Complaint  Patient presents with   Neck Pain    DOREEN GARRETSON is a 73 y.o. female.   Neck Pain 73 year old female history of hypertension, chronic neck pain, prior C6-C7 disc replacement presenting for headache, shoulder pain.  Symptom started last week with headache.  Not sudden onset, was diffuse throughout her head and worse than her typical headaches.  No fevers or chills.  Yesterday developed pain to her neck on the left side.  She has limited range of motion of her neck due to pain.  No fall or trauma.  She has had prior disc replacement at C6-C7 and is worried about this.  No vision changes or weakness or numbness.  No chest pain or shortness of breath.  Pain radiates from over her left trapezius up into her neck and the left posterior aspect of her head.  No exacerbating or relieving factors.     Home Medications Prior to Admission medications   Medication Sig Start Date End Date Taking? Authorizing Provider  acetaminophen (TYLENOL) 325 MG tablet Take 1-2 tablets (325-650 mg total) by mouth every 4 (four) hours as needed for mild pain. 12/03/21   Setzer, Lynnell Jude, PA-C  amLODipine (NORVASC) 10 MG tablet Take 1 tablet (10 mg total) by mouth daily. 10/29/23   Deeann Saint, MD  amoxicillin-clavulanate (AUGMENTIN) 875-125 MG tablet Take 1 tablet by mouth every 12 (twelve) hours. 05/02/23   Raspet, Noberto Retort, PA-C  ascorbic acid (VITAMIN C) 1000 MG tablet Take 1 tablet (1,000 mg total) by mouth daily. 03/18/22   [place], Betty G, MD  atorvastatin (LIPITOR) 40 MG tablet Take 1 tablet (40 mg total) by mouth daily. 03/18/22   [place], Betty G, MD  carvedilol (COREG) 25 MG tablet Take 1 tablet (25 mg total) by mouth 2 (two) times daily with a meal. 12/19/22   [place], Betty G, MD  clotrimazole-betamethasone Thurmond Butts) cream Apply to affected area 2  times daily prn 04/24/20   Eustace Moore, MD  COVID-19 mRNA vaccine, Pfizer, (COMIRNATY) syringe Inject into the muscle. 07/30/23   Judyann Munson, MD  diclofenac Sodium (VOLTAREN) 1 % GEL Apply 4 g topically 4 (four) times daily. 04/28/21   Melene Plan, DO  DULoxetine (CYMBALTA) 30 MG capsule Take 1 capsule (30 mg total) by mouth at bedtime. 01/02/22   Raulkar, Drema Pry, MD  fluticasone (FLONASE) 50 MCG/ACT nasal spray Place 2 sprays into both nostrils at bedtime as needed for allergies or rhinitis. 12/19/22   [place], Betty G, MD  HYDROcodone-acetaminophen (NORCO/VICODIN) 5-325 MG tablet Take 1 tablet by mouth every 6 (six) hours as needed. 06/29/23   Rodolph Bong, MD  ibuprofen (ADVIL) 800 MG tablet Take 1 tablet (800 mg total) by mouth 2 (two) times daily as needed. 05/02/23   Raspet, Erin K, PA-C  Ipratropium-Albuterol (COMBIVENT RESPIMAT) 20-100 MCG/ACT AERS respimat Inhale 1 puff into the lungs in the morning, at noon, and at bedtime. 12/19/22   [place], Betty G, MD  losartan (COZAAR) 25 MG tablet Take 1 tablet (25 mg total) by mouth daily. 03/18/22   [place], Betty G, MD  methimazole (TAPAZOLE) 5 MG tablet Take 0.5 tablets (2.5 mg total) by mouth 3 (three) times daily. Keep appt with endocrinologist 11/2022 09/30/22   [place], Betty G, MD  mirabegron ER (MYRBETRIQ) 25 MG TB24 tablet Take 1  tablet (25 mg total) by mouth daily. 12/19/22   [place], Betty G, MD  nitroGLYCERIN (NITROSTAT) 0.4 MG SL tablet Place 1 tablet (0.4 mg total) under the tongue every 5 (five) minutes as needed for chest pain. 01/04/23   Curatolo, Adam, DO  pantoprazole (PROTONIX) 40 MG tablet Take 1 tablet (40 mg total) by mouth at bedtime. 03/18/22   [place], Betty G, MD  polyethylene glycol (MIRALAX / GLYCOLAX) 17 g packet Take 17 g by mouth daily as needed for mild constipation. 12/03/21   Setzer, Lynnell Jude, PA-C  predniSONE (DELTASONE) 50 MG tablet Take 1 tablet (50 mg total) by mouth daily. 07/10/23   Rodolph Bong, MD  pregabalin  (LYRICA) 25 MG capsule Take 1-3 capsules (25-75 mg total) by mouth 2 (two) times daily as needed (nerve pain). 06/29/23   Rodolph Bong, MD  prochlorperazine (COMPAZINE) 5 MG tablet Take 1-2 tablets (5-10 mg total) by mouth every 6 (six) hours as needed for nausea. 12/03/21   Setzer, Lynnell Jude, PA-C  senna (SENOKOT) 8.6 MG TABS tablet Take 2 tablets (17.2 mg total) by mouth at bedtime. 12/03/21   Setzer, Lynnell Jude, PA-C  tiZANidine (ZANAFLEX) 4 MG tablet Take 1 tablet (4 mg total) by mouth every 6 (six) hours as needed for muscle spasms. 01/28/22   Rodolph Bong, MD  traZODone (DESYREL) 50 MG tablet Take 0.5-1 tablets (25-50 mg total) by mouth at bedtime as needed for sleep. 12/25/21   Rodolph Bong, MD  valACYclovir (VALTREX) 1000 MG tablet Take 1 tablet (1,000 mg total) by mouth 3 (three) times daily. 06/29/23   Rodolph Bong, MD      Allergies    Shellfish allergy, Gabapentin, Hm lidocaine patch [lidocaine], and Tape    Review of Systems   Review of Systems  Musculoskeletal:  Positive for neck pain.  Review of systems completed and notable as per HPI.  ROS otherwise negative.  Physical Exam Updated Vital Signs BP (!) 145/75 (BP Location: Right Arm)   Pulse 94   Temp 97.9 F (36.6 C)   Resp 20   SpO2 95%  Physical Exam Vitals and nursing note reviewed.  Constitutional:      General: She is not in acute distress.    Appearance: She is well-developed.  HENT:     Head: Normocephalic and atraumatic.     Nose: Nose normal.     Mouth/Throat:     Mouth: Mucous membranes are moist.     Pharynx: Oropharynx is clear.  Eyes:     Conjunctiva/sclera: Conjunctivae normal.  Cardiovascular:     Rate and Rhythm: Normal rate and regular rhythm.     Pulses: Normal pulses.     Heart sounds: Normal heart sounds. No murmur heard. Pulmonary:     Effort: Pulmonary effort is normal. No respiratory distress.     Breath sounds: Normal breath sounds.  Abdominal:     Palpations: Abdomen is soft.      Tenderness: There is no abdominal tenderness.  Musculoskeletal:        General: No swelling.     Cervical back: Neck supple.     Comments: Significant tenderness over the left trapezius.  No skin changes.  No midline spinal tenderness.  Range of motion the neck is somewhat limited due to pain.  She is not meningitic.  No cervical lymphadenopathy.  No tenderness over the shoulder, chest, back.  Skin:    General: Skin is warm and dry.  Capillary Refill: Capillary refill takes less than 2 seconds.  Neurological:     General: No focal deficit present.     Mental Status: She is alert and oriented to person, place, and time. Mental status is at baseline.  Psychiatric:        Mood and Affect: Mood normal.     ED Results / Procedures / Treatments   Labs (all labs ordered are listed, but only abnormal results are displayed) Labs Reviewed - No data to display  EKG None  Radiology CT Head Wo Contrast Result Date: 11/13/2023 CLINICAL DATA:  Headache, increasing frequency or severity; Neck pain, acute, prior cervical surgery EXAM: CT HEAD WITHOUT CONTRAST CT CERVICAL SPINE WITHOUT CONTRAST TECHNIQUE: Multidetector CT imaging of the head and cervical spine was performed following the standard protocol without intravenous contrast. Multiplanar CT image reconstructions of the cervical spine were also generated. RADIATION DOSE REDUCTION: This exam was performed according to the departmental dose-optimization program which includes automated exposure control, adjustment of the mA and/or kV according to patient size and/or use of iterative reconstruction technique. COMPARISON:  CT angio chest 01/04/2023, ultrasound thyroid neck 12/19/2020, CT head 12/02/2021, CT angio head and neck 04/10/2021 FINDINGS: CT HEAD FINDINGS Brain: Patchy and confluent areas of decreased attenuation are noted throughout the deep and periventricular white matter of the cerebral hemispheres bilaterally, compatible with chronic  microvascular ischemic disease. No evidence of large-territorial acute infarction. No parenchymal hemorrhage. No mass lesion. No extra-axial collection. No mass effect or midline shift. No hydrocephalus. Basilar cisterns are patent. Vascular: No hyperdense vessel. Skull: No acute fracture or focal lesion. Sinuses/Orbits: Paranasal sinuses and mastoid air cells are clear. The orbits are unremarkable. Other: None. CT CERVICAL SPINE FINDINGS Alignment: Normal. Skull base and vertebrae: C6-C7 interbody surgical hardware. Multilevel degenerative changes spine up to moderate findings at the C5-C6 level with associated posterior disc osteophyte complex formation. No associated severe osseous neural foraminal or central canal stenosis. No acute fracture. No aggressive appearing focal osseous lesion or focal pathologic process. Soft tissues and spinal canal: No prevertebral fluid or swelling. No visible canal hematoma. Upper chest: Unremarkable. Other: Enlarged bilateral thyroid glands with possibly increased in size markedly enlarged left thyroid gland that extends into the superior mediastinum. Partially visualized vascular stent along the left mediastinum. IMPRESSION: 1. No acute intracranial abnormality. 2. No acute displaced fracture or traumatic listhesis of the cervical spine. 3. Enlarged bilateral thyroid glands with possibly increased in size markedly enlarged left thyroid gland that extends into the superior mediastinum. This has been evaluated on previous imaging. (ref: J Am Coll Radiol. 2015 Feb;12(2): 143-50).Please refer to those examinations for follow-up recommendations regarding this finding. Electronically Signed   By: Tish Frederickson M.D.   On: 11/13/2023 22:24   CT Cervical Spine Wo Contrast Result Date: 11/13/2023 CLINICAL DATA:  Headache, increasing frequency or severity; Neck pain, acute, prior cervical surgery EXAM: CT HEAD WITHOUT CONTRAST CT CERVICAL SPINE WITHOUT CONTRAST TECHNIQUE:  Multidetector CT imaging of the head and cervical spine was performed following the standard protocol without intravenous contrast. Multiplanar CT image reconstructions of the cervical spine were also generated. RADIATION DOSE REDUCTION: This exam was performed according to the departmental dose-optimization program which includes automated exposure control, adjustment of the mA and/or kV according to patient size and/or use of iterative reconstruction technique. COMPARISON:  CT angio chest 01/04/2023, ultrasound thyroid neck 12/19/2020, CT head 12/02/2021, CT angio head and neck 04/10/2021 FINDINGS: CT HEAD FINDINGS Brain: Patchy and confluent areas of decreased  attenuation are noted throughout the deep and periventricular white matter of the cerebral hemispheres bilaterally, compatible with chronic microvascular ischemic disease. No evidence of large-territorial acute infarction. No parenchymal hemorrhage. No mass lesion. No extra-axial collection. No mass effect or midline shift. No hydrocephalus. Basilar cisterns are patent. Vascular: No hyperdense vessel. Skull: No acute fracture or focal lesion. Sinuses/Orbits: Paranasal sinuses and mastoid air cells are clear. The orbits are unremarkable. Other: None. CT CERVICAL SPINE FINDINGS Alignment: Normal. Skull base and vertebrae: C6-C7 interbody surgical hardware. Multilevel degenerative changes spine up to moderate findings at the C5-C6 level with associated posterior disc osteophyte complex formation. No associated severe osseous neural foraminal or central canal stenosis. No acute fracture. No aggressive appearing focal osseous lesion or focal pathologic process. Soft tissues and spinal canal: No prevertebral fluid or swelling. No visible canal hematoma. Upper chest: Unremarkable. Other: Enlarged bilateral thyroid glands with possibly increased in size markedly enlarged left thyroid gland that extends into the superior mediastinum. Partially visualized vascular  stent along the left mediastinum. IMPRESSION: 1. No acute intracranial abnormality. 2. No acute displaced fracture or traumatic listhesis of the cervical spine. 3. Enlarged bilateral thyroid glands with possibly increased in size markedly enlarged left thyroid gland that extends into the superior mediastinum. This has been evaluated on previous imaging. (ref: J Am Coll Radiol. 2015 Feb;12(2): 143-50).Please refer to those examinations for follow-up recommendations regarding this finding. Electronically Signed   By: Tish Frederickson M.D.   On: 11/13/2023 22:24    Procedures Procedures    Medications Ordered in ED Medications  acetaminophen (TYLENOL) tablet 1,000 mg (1,000 mg Oral Given 11/13/23 2029)  metoCLOPramide (REGLAN) tablet 10 mg (10 mg Oral Given 11/13/23 2029)  methocarbamol (ROBAXIN) tablet 500 mg (500 mg Oral Given 11/13/23 2234)  ketorolac (TORADOL) 15 MG/ML injection 15 mg (15 mg Intramuscular Given 11/13/23 2235)    ED Course/ Medical Decision Making/ A&P Clinical Course as of 11/13/23 2244  Fri Nov 13, 2023  2023 Patient declines IV medicine.  Given oral. [JD]    Clinical Course User Index [JD] Laurence Spates, MD                                 Medical Decision Making Amount and/or Complexity of Data Reviewed Radiology: ordered.  Risk OTC drugs. Prescription drug management.   Medical Decision Making:   CAROL THEYS is a 73 y.o. female who presented to the ED today with neck pain, headache.  Vital signs reviewed.  She is afebrile, not meningitic have low sufficient for CNS infection.  Her pain is reproduced with palpation over the left trapezius I suspect is more likely musculoskeletal.  However given headache will treat her symptomatically obtain CT head will CT cervical spine to evaluate for prior C6-C7 disc replacement.  She is no radicular pain or weakness I have low suspicion for herniated disc or spinal cord compression.   Patient placed on continuous  vitals and telemetry monitoring while in ED which was reviewed periodically.  Reviewed and confirmed nursing documentation for past medical history, family history, social history.  Reassessment and Plan:   After Reglan, Tylenol her headache is significantly improved.  She did not want IV pain medications.  CT head and cervical spine without acute pathology.  She does have markedly large thyroid gland.  She is aware of this, I told her is very important she follows with her PCP as she will  need further evaluation for cause.  She voiced understanding.  She would like to go home.  I ordered some Toradol and Robaxin for muscular pain.  Will discharge with course of Robaxin.  I gave her strict return precautions for worsening pain, neurologic changes, severe headache or fever.  She voiced understanding was comfortable this plan.   Patient's presentation is most consistent with acute complicated illness / injury requiring diagnostic workup.           Final Clinical Impression(s) / ED Diagnoses Final diagnoses:  Acute nonintractable headache, unspecified headache type    Rx / DC Orders ED Discharge Orders     None         Laurence Spates, MD 11/13/23 2244

## 2023-11-19 ENCOUNTER — Telehealth: Payer: Self-pay

## 2023-11-19 NOTE — Progress Notes (Signed)
Transition Care Management Unsuccessful Follow-up Telephone Call  Date of discharge and from where:  11/13/2023 [facility]  Attempts:  1st Attempt  Reason for unsuccessful TCM follow-up call:  No answer/busy  Ulyana Pitones [facility]  [facility] Guide Direct Dial: [phone]  Fax: [phone] Website: [email or web address]

## 2023-11-20 ENCOUNTER — Inpatient Hospital Stay: Admission: RE | Admit: 2023-11-20 | Payer: BC Managed Care – PPO | Source: Ambulatory Visit

## 2023-11-20 ENCOUNTER — Telehealth: Payer: Self-pay

## 2023-11-20 NOTE — Progress Notes (Signed)
Transition Care Management Follow-up Telephone Call Date of discharge and from where: 11/13/2023 [facility] How have you been since you were released from the hospital? Patient stated she is not feeling any better. She still has head and neck pain and the muscle relaxants prescribed are not helping. Patient stated she will call her PCP. Any questions or concerns? No  Items Reviewed: Did the pt receive and understand the discharge instructions provided? Yes  Medications obtained and verified? Yes  Other?  Patient stated she is interested in assistance obtaining a ramp for her home. Sent [id number] referral to [facility]ocational [facility]. Mailed information about the program.  Any new allergies since your discharge? No  Dietary orders reviewed? Yes Do you have support at home? Yes   Follow up appointments reviewed:  PCP Hospital f/u appt confirmed?  Patient stated she will call her PCP or have her daughter send a message through MyChart.  Scheduled to see  on  @ . Specialist Hospital f/u appt confirmed? No  Scheduled to see  on  @ . Are transportation arrangements needed? No  If their condition worsens, is the pt aware to call PCP or go to the Emergency Dept.? Yes Was the patient provided with contact information for the PCP's office or ED? Yes Was to pt encouraged to call back with questions or concerns? Yes   Othello Dickenson [facility]  [facility] Guide Direct Dial: [phone]  Fax: [phone] Website: [email or web address]

## 2023-11-24 ENCOUNTER — Telehealth: Payer: Self-pay

## 2023-11-24 NOTE — Progress Notes (Signed)
 Transition Care Management Follow-up Telephone Call Date of discharge and from where: 11/14/2023 [facility].  Received [id number] message from [facility] referral will be given to Geni [place], [place] counselor at [facility] for wheelchair ramp.  Sharon Stapel [facility]  [facility] Guide Direct Dial: [phone]  Fax: [phone] Website: [email or web address]

## 2023-12-02 NOTE — Progress Notes (Signed)
ACUTE VISIT No chief complaint on file.  HPI: [name] is a 73 y.o. female with a PMHx significant for HTN, aortic stenosis, lung nodule, COPD, GERD, hyperthyroidism, insomnia, HLD, and chronic neck pain, who is here today complaining of blood in her stool and to follow on hypertension.   Last seen on 12/19/2022.  Hypertension: Currently on *** BP readings at home:*** Negative for unusual or severe headache, visual changes, exertional chest pain, dyspnea,  focal weakness, or edema.  Lab Results  Component Value Date   NA 139 01/04/2023   CL 106 01/04/2023   K 3.2 (L) 01/04/2023   CO2 24 01/04/2023   BUN 9 01/04/2023   CREATININE 0.55 01/04/2023   GFRNONAA >60 01/04/2023   CALCIUM 9.2 01/04/2023   ALBUMIN 3.8 12/13/2021   GLUCOSE 125 (H) 01/04/2023   Review of Systems See other pertinent positives and negatives in HPI.  Current Outpatient Medications on File Prior to Visit  Medication Sig Dispense Refill   acetaminophen (TYLENOL) 325 MG tablet Take 1-2 tablets (325-650 mg total) by mouth every 4 (four) hours as needed for mild pain.     amLODipine (NORVASC) 10 MG tablet Take 1 tablet (10 mg total) by mouth daily. 30 tablet 0   amoxicillin-clavulanate (AUGMENTIN) 875-125 MG tablet Take 1 tablet by mouth every 12 (twelve) hours. 14 tablet 0   ascorbic acid (VITAMIN C) 1000 MG tablet Take 1 tablet (1,000 mg total) by mouth daily. 30 tablet 0   atorvastatin (LIPITOR) 40 MG tablet Take 1 tablet (40 mg total) by mouth daily. 90 tablet 2   carvedilol (COREG) 25 MG tablet Take 1 tablet (25 mg total) by mouth 2 (two) times daily with a meal. 180 tablet 2   clotrimazole-betamethasone (LOTRISONE) cream Apply to affected area 2 times daily prn 15 g 0   COVID-19 mRNA vaccine, Pfizer, (COMIRNATY) syringe Inject into the muscle. 0.3 mL 0   diclofenac Sodium (VOLTAREN) 1 % GEL Apply 4 g topically 4 (four) times daily. 100 g 0   DULoxetine (CYMBALTA) 30 MG capsule Take 1 capsule (30  mg total) by mouth at bedtime. 30 capsule 1   fluticasone (FLONASE) 50 MCG/ACT nasal spray Place 2 sprays into both nostrils at bedtime as needed for allergies or rhinitis. 16 g 1   HYDROcodone-acetaminophen (NORCO/VICODIN) 5-325 MG tablet Take 1 tablet by mouth every 6 (six) hours as needed. 15 tablet 0   ibuprofen (ADVIL) 800 MG tablet Take 1 tablet (800 mg total) by mouth 2 (two) times daily as needed. 6 tablet 0   Ipratropium-Albuterol (COMBIVENT RESPIMAT) 20-100 MCG/ACT AERS respimat Inhale 1 puff into the lungs in the morning, at noon, and at bedtime. 4 g 4   losartan (COZAAR) 25 MG tablet Take 1 tablet (25 mg total) by mouth daily. 90 tablet 1   methimazole (TAPAZOLE) 5 MG tablet Take 0.5 tablets (2.5 mg total) by mouth 3 (three) times daily. Keep appt with endocrinologist 11/2022 90 tablet 3   methocarbamol (ROBAXIN) 500 MG tablet Take 1 tablet (500 mg total) by mouth 2 (two) times daily as needed for muscle spasms. 20 tablet 0   mirabegron ER (MYRBETRIQ) 25 MG TB24 tablet Take 1 tablet (25 mg total) by mouth daily. 90 tablet 1   nitroGLYCERIN (NITROSTAT) 0.4 MG SL tablet Place 1 tablet (0.4 mg total) under the tongue every 5 (five) minutes as needed for chest pain. 30 tablet 0   pantoprazole (PROTONIX) 40 MG tablet Take 1 tablet (40  mg total) by mouth at bedtime. 90 tablet 2   polyethylene glycol (MIRALAX / GLYCOLAX) 17 g packet Take 17 g by mouth daily as needed for mild constipation. 14 each 0   predniSONE (DELTASONE) 50 MG tablet Take 1 tablet (50 mg total) by mouth daily. 5 tablet 0   pregabalin (LYRICA) 25 MG capsule Take 1-3 capsules (25-75 mg total) by mouth 2 (two) times daily as needed (nerve pain). 60 capsule 1   prochlorperazine (COMPAZINE) 5 MG tablet Take 1-2 tablets (5-10 mg total) by mouth every 6 (six) hours as needed for nausea. 30 tablet 0   senna (SENOKOT) 8.6 MG TABS tablet Take 2 tablets (17.2 mg total) by mouth at bedtime. 60 tablet 0   traZODone (DESYREL) 50 MG tablet  Take 0.5-1 tablets (25-50 mg total) by mouth at bedtime as needed for sleep. 20 tablet 0   valACYclovir (VALTREX) 1000 MG tablet Take 1 tablet (1,000 mg total) by mouth 3 (three) times daily. 21 tablet 0   No current facility-administered medications on file prior to visit.    Past Medical History:  Diagnosis Date   Chest pain    a. 01/2015 Echo: Nl LV fxn, Gr 1 DD, triv AI, mild MR.   Essential hypertension    Hepatic cyst    a. noted on CT 01/2015.   Hyperthyroidism    Multinodular goiter    a. 01/2015 CT chest: multinodular goidter w/ substernal extension of the left lobe of the thyroid assoc w/ rightward deviation of tracheal air column.   Neck pain, chronic    Personal history of colonic polyps    Pulmonary nodules    a. 01/2015 CT Chest: RLL ~ 5mm subpleural nodule - rec f/u in 6-12 mos.   Splenic cyst    a. noted on CT 01/2015.   Allergies  Allergen Reactions   Shellfish Allergy Anaphylaxis   Gabapentin     Other reaction(s): Confusion (intolerance)   Hm Lidocaine Patch [Lidocaine] Other (See Comments)    Lidocaine patches caused burning   Tape Other (See Comments)    Pulls skin    Social History   Socioeconomic History   Marital status: Single    Spouse name: Not on file   Number of children: Not on file   Years of education: Not on file   Highest education level: Not on file  Occupational History   Occupation: retired  Tobacco Use   Smoking status: Light Smoker    Types: Cigarettes   Smokeless tobacco: Never   Tobacco comments:    1-2 NOT EVERY DAY   Vaping Use   Vaping status: Never Used  Substance and Sexual Activity   Alcohol use: Yes    Alcohol/week: 0.0 standard drinks of alcohol    Comment: rare   Drug use: No   Sexual activity: Not Currently  Other Topics Concern   Not on file  Social History Narrative   Lives alone.  Four children and eight grands.     Social Drivers of Corporate investment banker Strain: Low Risk  (12/19/2021)   Overall  Financial Resource Strain (CARDIA)    Difficulty of Paying Living Expenses: Not hard at all  Food Insecurity: No Food Insecurity (11/20/2023)   Hunger Vital Sign    Worried About Running Out of Food in the Last Year: Never true    Ran Out of Food in the Last Year: Never true  Transportation Needs: No Transportation Needs (11/20/2023)   PRAPARE -  Administrator, Civil Service (Medical): No    Lack of Transportation (Non-Medical): No  Physical Activity: Inactive (03/12/2023)   Exercise Vital Sign    Days of Exercise per Week: 0 days    Minutes of Exercise per Session: 0 min  Stress: Stress Concern Present (03/12/2023)   [employer] of Occupational Health - Occupational Stress Questionnaire    Feeling of Stress : To some extent  Social Connections: Moderately Integrated (12/19/2021)   Social Connection and Isolation Panel [NHANES]    Frequency of Communication with Friends and Family: More than three times a week    Frequency of Social Gatherings with Friends and Family: More than three times a week    Attends Religious Services: More than 4 times per year    Active Member of [employer] or Organizations: Yes    Attends Banker Meetings: 1 to 4 times per year    Marital Status: Never married    There were no vitals filed for this visit. There is no height or weight on file to calculate BMI.  Physical Exam  ASSESSMENT AND PLAN:  Darlene Griffith Griffith was seen today for blood in her stool and hypertension follow up.   There are no diagnoses linked to this encounter.  No follow-ups on file.  I, Rolla Etienne Wierda, acting as a scribe for Vyolet Sakuma [place], MD., have documented all relevant documentation on the behalf of Markel Kurtenbach [place], MD, as directed by  Hampton Cost [place], MD while in the presence of Donatella Walski [place], MD.   I, Lylla Eifler [place], MD, have reviewed all documentation for this visit. The documentation on 12/02/23 for the exam, diagnosis, procedures, and orders are all accurate and  complete.  Zaccai Chavarin G. [place], MD  [facility]. [facility] office.  Discharge Instructions   None

## 2023-12-03 ENCOUNTER — Ambulatory Visit: Payer: BC Managed Care – PPO

## 2023-12-03 ENCOUNTER — Ambulatory Visit (INDEPENDENT_AMBULATORY_CARE_PROVIDER_SITE_OTHER): Payer: BC Managed Care – PPO | Admitting: Family Medicine

## 2023-12-03 ENCOUNTER — Encounter: Payer: Self-pay | Admitting: Family Medicine

## 2023-12-03 VITALS — BP 120/66 | HR 86 | Ht 61.0 in

## 2023-12-03 DIAGNOSIS — M545 Low back pain, unspecified: Secondary | ICD-10-CM | POA: Diagnosis not present

## 2023-12-03 DIAGNOSIS — M48062 Spinal stenosis, lumbar region with neurogenic claudication: Secondary | ICD-10-CM

## 2023-12-03 MED ORDER — PREGABALIN 75 MG PO CAPS: 75.0000 mg | ORAL_CAPSULE | Freq: Two times a day (BID) | ORAL | 1 refills | Status: AC | PRN

## 2023-12-03 MED ORDER — PREDNISONE 50 MG PO TABS: 50.0000 mg | ORAL_TABLET | Freq: Every day | ORAL | 0 refills | Status: DC

## 2023-12-03 NOTE — Patient Instructions (Addendum)
Thank you for coming in today.   Please get an Xray today before you leave

## 2023-12-03 NOTE — Progress Notes (Signed)
I, Darlene Griffith, CMA acting as a scribe for Darlene Graham, MD.  [name] is a 73 y.o. female who presents to [facility] today for cont'd back pain. Pt was last seen by Dr. Denyse Amass on 06/29/23 and was prescribed hydrocodone and valacyclovir. Lumbar ESI was also ordered and performed on 9/19 and repeated on 12/11.  Today, pt reports worsening pain in the lower back and right leg. Notes throbbing pain anterior lower leg. Unable to bear weight at times d/t pain. When pain is more severe, will also have shooting pain into the anterior hip. Notes minimal relief with this past ESI, had more relief with the injection 2 times ago. Sx causing night disturbance. Ambulating in wheelchair today.  Significant new leg weakness.   Radiating pain: yes LE numbness/tingling: yes LE weakness: R LE Aggravates: WB, ambulation Treatments tried: muscle relaxer, steroid, Voltaren, Hydrocodone  Dx imaging: 01/22/23 Vasc US ABI 01/17/23 L-spine MRI             01/08/23 L-spine XR 12/21/21 L-spine & T-spine MRI 05/28/18 L hip/pelvis XR  Pertinent review of systems: No fevers or chills  Relevant historical information: Peripheral vascular disease.   Exam:  BP 120/66   Pulse 86   Ht 5\' 1"  (1.549 m)   SpO2 99%   BMI 31.18 kg/m  General: In pain appearing woman seated in a wheelchair.  MSK: L-spine nontender palpation midline.  Decreased lumbar motion. Lower extremity strength significantly decreased. Hip flexion: Right 3+/5.  Left 4/5. Hip abduction 4/5 bilateral. Hip adduction 4/5 bilateral. Knee extension: Right 3+/5 left 4/5. Knee flexion 4+/5 bilateral. Foot dorsiflexion right 4/5 left 5/5 Foot plantarflexion 4/5 bilateral Reflexes diminished bilateral knees and ankles.    Lab and Radiology Results  X-ray images L-spine and right hip obtained today personally and independently interpreted.  L-spine: No acute fractures.  No severe DJD.  Mild DDD and moderate  facet DJD present L5-S1.  Right hip: Mild right hip osteoarthritis.  No acute fractures are visible.  Await formal radiology over read    Assessment and Plan: 73 y.o. female with severe right leg pain associated with new weakness.  This occurs in the setting of some spinal nerve root stenosis on MRI from March 2024.  Her symptoms today are much worse than they have been historically and much worse than her approximately 1 year earlier MRI would indicate.  Plan for new x-rays today and stat lumbar spine MRI.  Will prescribe prednisone and pregabalin for pain control.  Anticipate either direct referral to neurosurgery or a repeat epidural steroid injection once the results of the MRI are back.   PDMP reviewed during this encounter. Orders Placed This Encounter  Procedures   MR LUMBAR SPINE WO CONTRAST    Call my cell [phone] with results    Standing Status:   Future    Expiration Date:   12/31/2023    What is the patient's sedation requirement?:   No Sedation    Does the patient have a pacemaker or implanted devices?:   No    Preferred imaging location?:   [facility] (table limit-350lbs)   DG Lumbar Spine 2-3 Views    Standing Status:   Future    Expiration Date:   12/02/2024    Reason for Exam (SYMPTOM  OR DIAGNOSIS REQUIRED):   eval lumbar rad    Preferred imaging location?:   [facility]   DG HIP UNILAT WITH PELVIS 2-3 VIEWS  RIGHT    Standing Status:   Future    Expiration Date:   12/02/2024    Reason for Exam (SYMPTOM  OR DIAGNOSIS REQUIRED):   eval hip pain    Preferred imaging location?:    [facility]   Meds ordered this encounter  Medications   predniSONE (DELTASONE) 50 MG tablet    Sig: Take 1 tablet (50 mg total) by mouth daily.    Dispense:  5 tablet    Refill:  0   pregabalin (LYRICA) 75 MG capsule    Sig: Take 1 capsule (75 mg total) by mouth 2 (two) times daily as needed (nerve pain).    Dispense:  60 capsule    Refill:  1      Discussed warning signs or symptoms. Please see discharge instructions. Patient expresses understanding.   The above documentation has been reviewed and is accurate and complete Darlene Griffith, M.D.

## 2023-12-04 ENCOUNTER — Encounter: Payer: Self-pay | Admitting: Family Medicine

## 2023-12-04 ENCOUNTER — Telehealth: Payer: Self-pay | Admitting: Family Medicine

## 2023-12-04 ENCOUNTER — Ambulatory Visit (INDEPENDENT_AMBULATORY_CARE_PROVIDER_SITE_OTHER): Payer: BC Managed Care – PPO | Admitting: Family Medicine

## 2023-12-04 VITALS — BP 128/60 | HR 95 | Resp 16 | Ht 61.0 in | Wt 158.2 lb

## 2023-12-04 DIAGNOSIS — L989 Disorder of the skin and subcutaneous tissue, unspecified: Secondary | ICD-10-CM | POA: Diagnosis not present

## 2023-12-04 DIAGNOSIS — R7309 Other abnormal glucose: Secondary | ICD-10-CM | POA: Diagnosis not present

## 2023-12-04 DIAGNOSIS — K625 Hemorrhage of anus and rectum: Secondary | ICD-10-CM

## 2023-12-04 DIAGNOSIS — R5383 Other fatigue: Secondary | ICD-10-CM | POA: Diagnosis not present

## 2023-12-04 DIAGNOSIS — E785 Hyperlipidemia, unspecified: Secondary | ICD-10-CM | POA: Diagnosis not present

## 2023-12-04 DIAGNOSIS — I10 Essential (primary) hypertension: Secondary | ICD-10-CM

## 2023-12-04 DIAGNOSIS — D509 Iron deficiency anemia, unspecified: Secondary | ICD-10-CM

## 2023-12-04 DIAGNOSIS — E042 Nontoxic multinodular goiter: Secondary | ICD-10-CM

## 2023-12-04 DIAGNOSIS — L304 Erythema intertrigo: Secondary | ICD-10-CM

## 2023-12-04 MED ORDER — NYSTATIN 100000 UNIT/GM EX POWD
1.0000 | Freq: Three times a day (TID) | CUTANEOUS | 3 refills | Status: AC
Start: 2023-12-04 — End: ?

## 2023-12-04 NOTE — Assessment & Plan Note (Signed)
Continue atorvastatin 40 mg daily and low-fat diet. Last LDL 183 in 03/2021. Fasting lipid panel added to labs today.

## 2023-12-04 NOTE — Assessment & Plan Note (Signed)
BP adequately controlled. Continue amlodipine 10 mg daily, losartan 25 mg daily, and carvedilol 25 mg twice daily. Low-salt/DASH diet recommended. Follow-up in 6 months.

## 2023-12-04 NOTE — Telephone Encounter (Signed)
Recd emailed copy of FMLA forms for pt, with note that pt would come by to complete employee section. In Brandy's box.

## 2023-12-04 NOTE — Assessment & Plan Note (Signed)
Problem seems to be recovering for a while. We discussed possible etiologies, most likely related to internal hemorrhoids. GI referral placed. Instructed about warning signs.

## 2023-12-04 NOTE — Assessment & Plan Note (Signed)
She has not been following with endocrinology as recommended. Contact information given, so she can call and arrange a follow-up appointment.

## 2023-12-04 NOTE — Patient Instructions (Addendum)
A few things to remember from today's visit:  Essential hypertension - Plan: Comprehensive metabolic panel  Elevated glucose level - Plan: Hemoglobin A1c  Other fatigue  Scalp lesion - Plan: Ambulatory referral to Dermatology  Intertrigo - Plan: Ambulatory referral to Dermatology, nystatin (MYCOSTATIN/NYSTOP) powder  Rectal bleeding - Plan: Ambulatory referral to Gastroenterology, CBC  Iron deficiency anemia, unspecified iron deficiency anemia type - Plan: Vitamin B12, Iron, Ferritin  Hyperlipidemia, unspecified hyperlipidemia type - Plan: Comprehensive metabolic panel, Lipid panel  For hypertension you are taking Amlodipine, carvedilol, and Losartan. For cholesterol you are on Atorvastatin.  This is the information about your endocrinologist. [facility]  [address]  [place], [place] [address]  [phone]  Judieth Keens, [address]  [place], [place] [address]  [phone] (Work)     If you need refills for medications you take chronically, please call your pharmacy. Do not use My Chart to request refills or for acute issues that need immediate attention. If you send a my chart message, it may take a few days to be addressed, specially if I am not in the office.  Please be sure medication list is accurate. If a new problem present, please set up appointment sooner than planned today.

## 2023-12-06 ENCOUNTER — Ambulatory Visit (INDEPENDENT_AMBULATORY_CARE_PROVIDER_SITE_OTHER): Payer: BC Managed Care – PPO

## 2023-12-06 DIAGNOSIS — M5441 Lumbago with sciatica, right side: Secondary | ICD-10-CM

## 2023-12-06 DIAGNOSIS — M48062 Spinal stenosis, lumbar region with neurogenic claudication: Secondary | ICD-10-CM

## 2023-12-06 DIAGNOSIS — M48061 Spinal stenosis, lumbar region without neurogenic claudication: Secondary | ICD-10-CM | POA: Diagnosis not present

## 2023-12-06 DIAGNOSIS — M5136 Other intervertebral disc degeneration, lumbar region with discogenic back pain only: Secondary | ICD-10-CM | POA: Diagnosis not present

## 2023-12-06 DIAGNOSIS — D1809 Hemangioma of other sites: Secondary | ICD-10-CM | POA: Diagnosis not present

## 2023-12-07 ENCOUNTER — Encounter: Payer: Self-pay | Admitting: Family Medicine

## 2023-12-07 ENCOUNTER — Telehealth: Payer: Self-pay

## 2023-12-07 ENCOUNTER — Telehealth: Payer: Self-pay | Admitting: Family Medicine

## 2023-12-07 DIAGNOSIS — M48062 Spinal stenosis, lumbar region with neurogenic claudication: Secondary | ICD-10-CM

## 2023-12-07 NOTE — Progress Notes (Signed)
 Right hip x-ray looks okay to the radiologist.  The left hip does have some arthritis .

## 2023-12-07 NOTE — Progress Notes (Signed)
 Lumbar spine MRI does show a worsening pinched nerve at L2 and L4. It is not terrible looking.  It is possible there is a hip issue more than there is a back issue.  I am going to order a new epidural steroid injection.

## 2023-12-07 NOTE — Telephone Encounter (Signed)
Form completed and placed on Dr. Corey's desk to review and sign.  

## 2023-12-07 NOTE — Progress Notes (Signed)
 Low back x-ray does not show any acute findings.  No broken bones.

## 2023-12-07 NOTE — Telephone Encounter (Signed)
 Epidural steroid injection ordered

## 2023-12-07 NOTE — Telephone Encounter (Signed)
 Form reviewed and signed by Dr. Denyse Amass, and placed at the front desk for faxing/scanning.

## 2023-12-08 NOTE — Telephone Encounter (Signed)
 Form successfully faxed to [phone], sent to scan.

## 2023-12-09 NOTE — Discharge Instructions (Addendum)

## 2023-12-10 ENCOUNTER — Inpatient Hospital Stay
Admission: RE | Admit: 2023-12-10 | Discharge: 2023-12-10 | Disposition: A | Discharge status: Home / Self Care | Payer: BC Managed Care – PPO | Source: Ambulatory Visit | Attending: Family Medicine | Admitting: Family Medicine

## 2023-12-11 ENCOUNTER — Other Ambulatory Visit: Payer: Self-pay | Admitting: Family Medicine

## 2023-12-11 DIAGNOSIS — I10 Essential (primary) hypertension: Secondary | ICD-10-CM

## 2023-12-11 NOTE — Telephone Encounter (Signed)
 Copied from CRM [id number]. Topic: Clinical - Medication Refill >> Dec 11, 2023  3:40 PM [name] wrote: Most Recent Primary Care Visit:  Provider: [place], BETTY G  Department: [facility]  Visit Type: ACUTE  Date: 12/04/2023  Medication:  amlodipine 10 mg daily, losartan 25 mg daily, and carvedilol 25 mg twice daily.  Has the patient contacted their pharmacy? Yes (Agent: If no, request that the patient contact the pharmacy for the refill. If patient does not wish to contact the pharmacy document the reason why and proceed with request.) (Agent: If yes, when and what did the pharmacy advise?)  Is this the correct pharmacy for this prescription? Yes If no, delete pharmacy and type the correct one.  This is the patient's preferred pharmacy:  [facility] [address] ([address] [place] - [address] [place] ([place]) [place] [address] Phone: [phone] Fax: [phone]   Has the prescription been filled recently? Yes  Is the patient out of the medication? Yes  Has the patient been seen for an appointment in the last year OR does the patient have an upcoming appointment? Yes  Can we respond through MyChart? Yes  Agent: Please be advised that Rx refills may take up to 3 business days. We ask that you follow-up with your pharmacy.

## 2023-12-14 MED ORDER — AMLODIPINE BESYLATE 10 MG PO TABS: 10.0000 mg | ORAL_TABLET | Freq: Every day | ORAL | 2 refills | Status: DC

## 2023-12-14 MED ORDER — CARVEDILOL 25 MG PO TABS: 25.0000 mg | ORAL_TABLET | Freq: Two times a day (BID) | ORAL | 2 refills | Status: AC

## 2023-12-14 NOTE — Discharge Instructions (Signed)

## 2023-12-15 ENCOUNTER — Ambulatory Visit
Admission: RE | Admit: 2023-12-15 | Discharge: 2023-12-15 | Disposition: A | Discharge status: Home / Self Care | Payer: BC Managed Care – PPO | Source: Ambulatory Visit | Attending: Family Medicine | Admitting: Family Medicine

## 2023-12-15 DIAGNOSIS — M47817 Spondylosis without myelopathy or radiculopathy, lumbosacral region: Secondary | ICD-10-CM | POA: Diagnosis not present

## 2023-12-15 DIAGNOSIS — M48062 Spinal stenosis, lumbar region with neurogenic claudication: Secondary | ICD-10-CM

## 2023-12-15 MED ORDER — METHYLPREDNISOLONE ACETATE 40 MG/ML INJ SUSP (RADIOLOG
80.0000 mg | Freq: Once | INTRAMUSCULAR | Status: AC
  Administered 2023-12-15: 80 mg via EPIDURAL

## 2023-12-15 MED ORDER — IOPAMIDOL (ISOVUE-M 200) INJECTION 41%
1.0000 mL | Freq: Once | INTRAMUSCULAR | Status: AC
  Administered 2023-12-15: 1 mL via EPIDURAL

## 2023-12-24 DIAGNOSIS — L820 Inflamed seborrheic keratosis: Secondary | ICD-10-CM | POA: Diagnosis not present

## 2023-12-24 DIAGNOSIS — L2989 Other pruritus: Secondary | ICD-10-CM | POA: Diagnosis not present

## 2023-12-24 DIAGNOSIS — L821 Other seborrheic keratosis: Secondary | ICD-10-CM | POA: Diagnosis not present

## 2023-12-24 DIAGNOSIS — L538 Other specified erythematous conditions: Secondary | ICD-10-CM | POA: Diagnosis not present

## 2024-01-18 ENCOUNTER — Ambulatory Visit: Payer: Self-pay | Admitting: Family Medicine

## 2024-01-18 DIAGNOSIS — R404 Transient alteration of awareness: Secondary | ICD-10-CM | POA: Diagnosis not present

## 2024-01-18 DIAGNOSIS — I10 Essential (primary) hypertension: Secondary | ICD-10-CM | POA: Diagnosis not present

## 2024-01-18 NOTE — Telephone Encounter (Signed)
 Low Blood Pressure this morning (70 SBP/ not sure bottom) Symptoms: Slurred speech, dizziness, incoherent this morning, hot/sweating Pertinent Negatives: Patient denies any symptoms now  Disposition:  [x]  Follow-up with PCP  Additional Notes: Spoke with pt's daughter, Nadyne Coombes. This morning EMS was called for pt as she had the above symptoms. Pt SBP was 70 and she was given fluids. Ruled out heart attack and stroke. Pt blood pressure came back up and she has been drinking a lot of fluids. Pt daughter not sure current BP as she does not have a BP monitor. Pt daughter is going to get one soon. Pt does not have any symptoms now. Pt scheduled for appt by agent on 4/4 with PCP. This date was pt preference as she did not want to miss any work days. This RN educated pt daughter on home care, new-worsening symptoms, when to call back/seek emergent care. Pt daughter verbalized understanding and agrees to plan.  Pt daughter requests call back at [phone] with answer to whether pt should stop taking one of her BP medications. Pt currently takes amlodipine 10 mg daily and carvedilol 25 mg bid.     Copied from CRM [id number]. Topic: Clinical - Red Word Triage >> Jan 18, 2024  2:36 PM Almira Coaster wrote: Red Word that prompted transfer to Nurse Triage: Patient's daughter was calling because earlier this morning while speaking with the patient she noticed slurred speech and called EMS, they informed her that blood pressure was low and provided some liquids for the patient to take but advise to contact PCP's office. They also advised that this can caused by a reaction to her medication amLODipine (NORVASC) 10 MG tablet  & carvedilol (COREG) 25 MG tablet. Reason for Disposition  Wants doctor to measure BP  Answer Assessment - Initial Assessment Questions 1. BLOOD PRESSURE: "What is the blood pressure?" "Did you take at least two measurements 5 minutes apart?"     *No Answer* 2. ONSET: "When did you take your  blood pressure?"     *No Answer* 3. HOW: "How did you obtain the blood pressure?" (e.g., visiting nurse, automatic home BP monitor)     *No Answer* 4. HISTORY: "Do you have a history of low blood pressure?" "What is your blood pressure normally?"     *No Answer* 5. MEDICINES: "Are you taking any medications for blood pressure?" If Yes, ask: "Have they been changed recently?"     *No Answer* 6. PULSE RATE: "Do you know what your pulse rate is?"      *No Answer* 7. OTHER SYMPTOMS: "Have you been sick recently?" "Have you had a recent injury?"     *No Answer* 8. PREGNANCY: "Is there any chance you are pregnant?" "When was your last menstrual period?"     *No Answer*  Protocols used: Blood Pressure - Low-A-AH

## 2024-01-18 NOTE — Telephone Encounter (Signed)
 According to triage note SBP was 70, not sure about DBP. If his BP is still under 100, she can decrease dose of carvedilol from 25 mg twice daily to 12.5 mg twice daily. Continue monitoring BP regularly. Continue adequate hydration. Thanks, BJ

## 2024-01-18 NOTE — Telephone Encounter (Signed)
 I left pt's daughter a voicemail to call the office back, okay for triage nurses to give directions below.

## 2024-01-18 NOTE — Telephone Encounter (Signed)
 Pt daughter requests call back at [phone] with answer to whether pt should stop taking one of her BP medications. Pt currently takes amlodipine 10 mg daily and carvedilol 25 mg bid.

## 2024-01-18 NOTE — Telephone Encounter (Signed)
 I spoke with pt's daughter and pt was also on the line. They are aware of the message below & verbalized understanding.

## 2024-01-18 NOTE — Telephone Encounter (Signed)
 Answer Assessment - Initial Assessment Questions 1. BLOOD PRESSURE: "What is the blood pressure?" "Did you take at least two measurements 5 minutes apart?"     SBP 70 this morning; pt daughter not sure top number. Pt daughter states BP came back up but does not know value. 2. ONSET: "When did you take your blood pressure?"     This morning 3. HOW: "How did you obtain the blood pressure?" (e.g., visiting nurse, automatic home BP monitor)    EMS 4. HISTORY: "Do you have a history of low blood pressure?" "What is your blood pressure normally?"     Pt has htn 5. MEDICINES: "Are you taking any medications for blood pressure?" If Yes, ask: "Have they been changed recently?"   Amlodipine and carvedilol Protocols used: Blood Pressure - Low-A-AH

## 2024-01-20 NOTE — Progress Notes (Signed)
 ACUTE VISIT No chief complaint on file.  HPI: Darlene Griffith is a 73 y.o. female with a PMHx significant for HTN, aortic stenosis, lung nodule, COPD, GERD, hyperthyroidism, insomnia, HLD, and chronic neck pain, who is here today complaining of low blood pressure.   Review of Systems See other pertinent positives and negatives in HPI.  Current Outpatient Medications on File Prior to Visit  Medication Sig Dispense Refill   acetaminophen (TYLENOL) 325 MG tablet Take 1-2 tablets (325-650 mg total) by mouth every 4 (four) hours as needed for mild pain.     amLODipine (NORVASC) 10 MG tablet Take 1 tablet (10 mg total) by mouth daily. 90 tablet 2   ascorbic acid (VITAMIN C) 1000 MG tablet Take 1 tablet (1,000 mg total) by mouth daily. 30 tablet 0   atorvastatin (LIPITOR) 40 MG tablet Take 1 tablet (40 mg total) by mouth daily. 90 tablet 2   carvedilol (COREG) 25 MG tablet Take 1 tablet (25 mg total) by mouth 2 (two) times daily with a meal. 180 tablet 2   clotrimazole-betamethasone (LOTRISONE) cream Apply to affected area 2 times daily prn 15 g 0   COVID-19 mRNA vaccine, Pfizer, (COMIRNATY) syringe Inject into the muscle. 0.3 mL 0   diclofenac Sodium (VOLTAREN) 1 % GEL Apply 4 g topically 4 (four) times daily. 100 g 0   DULoxetine (CYMBALTA) 30 MG capsule Take 1 capsule (30 mg total) by mouth at bedtime. 30 capsule 1   fluticasone (FLONASE) 50 MCG/ACT nasal spray Place 2 sprays into both nostrils at bedtime as needed for allergies or rhinitis. 16 g 1   HYDROcodone-acetaminophen (NORCO/VICODIN) 5-325 MG tablet Take 1 tablet by mouth every 6 (six) hours as needed. 15 tablet 0   ibuprofen (ADVIL) 800 MG tablet Take 1 tablet (800 mg total) by mouth 2 (two) times daily as needed. 6 tablet 0   Ipratropium-Albuterol (COMBIVENT RESPIMAT) 20-100 MCG/ACT AERS respimat Inhale 1 puff into the lungs in the morning, at noon, and at bedtime. 4 g 4   losartan (COZAAR) 25 MG tablet Take 1 tablet (25 mg  total) by mouth daily. 90 tablet 1   methimazole (TAPAZOLE) 5 MG tablet Take 0.5 tablets (2.5 mg total) by mouth 3 (three) times daily. Keep appt with endocrinologist 11/2022 90 tablet 3   methocarbamol (ROBAXIN) 500 MG tablet Take 1 tablet (500 mg total) by mouth 2 (two) times daily as needed for muscle spasms. 20 tablet 0   mirabegron ER (MYRBETRIQ) 25 MG TB24 tablet Take 1 tablet (25 mg total) by mouth daily. 90 tablet 1   nitroGLYCERIN (NITROSTAT) 0.4 MG SL tablet Place 1 tablet (0.4 mg total) under the tongue every 5 (five) minutes as needed for chest pain. 30 tablet 0   nystatin (MYCOSTATIN/NYSTOP) powder Apply 1 Application topically 3 (three) times daily. 60 g 3   pantoprazole (PROTONIX) 40 MG tablet Take 1 tablet (40 mg total) by mouth at bedtime. 90 tablet 2   polyethylene glycol (MIRALAX / GLYCOLAX) 17 g packet Take 17 g by mouth daily as needed for mild constipation. 14 each 0   predniSONE (DELTASONE) 50 MG tablet Take 1 tablet (50 mg total) by mouth daily. 5 tablet 0   pregabalin (LYRICA) 75 MG capsule Take 1 capsule (75 mg total) by mouth 2 (two) times daily as needed (nerve pain). 60 capsule 1   prochlorperazine (COMPAZINE) 5 MG tablet Take 1-2 tablets (5-10 mg total) by mouth every 6 (six) hours as needed for  nausea. 30 tablet 0   senna (SENOKOT) 8.6 MG TABS tablet Take 2 tablets (17.2 mg total) by mouth at bedtime. 60 tablet 0   traZODone (DESYREL) 50 MG tablet Take 0.5-1 tablets (25-50 mg total) by mouth at bedtime as needed for sleep. 20 tablet 0   valACYclovir (VALTREX) 1000 MG tablet Take 1 tablet (1,000 mg total) by mouth 3 (three) times daily. 21 tablet 0   No current facility-administered medications on file prior to visit.    Past Medical History:  Diagnosis Date   Chest pain    a. 01/2015 Echo: Nl LV fxn, Gr 1 DD, triv AI, mild MR.   Essential hypertension    Hepatic cyst    a. noted on CT 01/2015.   Hyperthyroidism    Multinodular goiter    a. 01/2015 CT chest:  multinodular goidter w/ substernal extension of the left lobe of the thyroid assoc w/ rightward deviation of tracheal air column.   Neck pain, chronic    Personal history of colonic polyps    Pulmonary nodules    a. 01/2015 CT Chest: RLL ~ 5mm subpleural nodule - rec f/u in 6-12 mos.   Splenic cyst    a. noted on CT 01/2015.   Allergies  Allergen Reactions   Shellfish Allergy Anaphylaxis   Gabapentin     Other reaction(s): Confusion (intolerance)   Hm Lidocaine Patch [Lidocaine] Other (See Comments)    Lidocaine patches caused burning   Tape Other (See Comments)    Pulls skin    Social History   Socioeconomic History   Marital status: Single    Spouse name: Not on file   Number of children: Not on file   Years of education: Not on file   Highest education level: Not on file  Occupational History   Occupation: retired  Tobacco Use   Smoking status: Light Smoker    Types: Cigarettes   Smokeless tobacco: Never   Tobacco comments:    1-2 NOT EVERY DAY   Vaping Use   Vaping status: Never Used  Substance and Sexual Activity   Alcohol use: Yes    Alcohol/week: 0.0 standard drinks of alcohol    Comment: rare   Drug use: No   Sexual activity: Not Currently  Other Topics Concern   Not on file  Social History Narrative   Lives alone.  Four children and eight grands.     Social Drivers of Corporate investment banker Strain: Low Risk  (12/19/2021)   Overall Financial Resource Strain (CARDIA)    Difficulty of Paying Living Expenses: Not hard at all  Food Insecurity: No Food Insecurity (11/20/2023)   Hunger Vital Sign    Worried About Running Out of Food in the Last Year: Never true    Ran Out of Food in the Last Year: Never true  Transportation Needs: No Transportation Needs (11/20/2023)   PRAPARE - Administrator, Civil Service (Medical): No    Lack of Transportation (Non-Medical): No  Physical Activity: Inactive (03/12/2023)   Exercise Vital Sign    Days of  Exercise per Week: 0 days    Minutes of Exercise per Session: 0 min  Stress: Stress Concern Present (03/12/2023)   [employer] of Occupational Health - Occupational Stress Questionnaire    Feeling of Stress : To some extent  Social Connections: Moderately Integrated (12/19/2021)   Social Connection and Isolation Panel [NHANES]    Frequency of Communication with Friends and Family: More than  three times a week    Frequency of Social Gatherings with Friends and Family: More than three times a week    Attends Religious Services: More than 4 times per year    Active Member of Clubs or Organizations: Yes    Attends Banker Meetings: 1 to 4 times per year    Marital Status: Never married   There were no vitals filed for this visit. There is no height or weight on file to calculate BMI.  Physical Exam  ASSESSMENT AND PLAN:  Ms. Hammes was seen today for low blood pressure.   There are no diagnoses linked to this encounter.  No follow-ups on file.  I, Rolla Etienne Wierda, acting as a scribe for Mitzie Marlar [place], MD., have documented all relevant documentation on the behalf of Journiee Feldkamp [place], MD, as directed by  Anndrea Mihelich [place], MD while in the presence of Merica Prell [place], MD.   I, Samayah Novinger [place], MD, have reviewed all documentation for this visit. The documentation on 01/20/24 for the exam, diagnosis, procedures, and orders are all accurate and complete.  Coleby Yett G. [place], MD  [facility]. [place] office.  Discharge Instructions   None

## 2024-01-22 ENCOUNTER — Encounter: Payer: Self-pay | Admitting: Family Medicine

## 2024-01-22 ENCOUNTER — Ambulatory Visit (INDEPENDENT_AMBULATORY_CARE_PROVIDER_SITE_OTHER): Admitting: Family Medicine

## 2024-01-22 VITALS — BP 120/70 | HR 100 | Resp 16 | Ht 61.0 in | Wt 154.4 lb

## 2024-01-22 DIAGNOSIS — I10 Essential (primary) hypertension: Secondary | ICD-10-CM | POA: Diagnosis not present

## 2024-01-22 DIAGNOSIS — K432 Incisional hernia without obstruction or gangrene: Secondary | ICD-10-CM

## 2024-01-22 DIAGNOSIS — M5416 Radiculopathy, lumbar region: Secondary | ICD-10-CM | POA: Insufficient documentation

## 2024-01-22 DIAGNOSIS — I7100 Dissection of unspecified site of aorta: Secondary | ICD-10-CM | POA: Diagnosis not present

## 2024-01-22 LAB — COMPREHENSIVE METABOLIC PANEL WITH GFR
ALT: 11 U/L (ref 0–35)
AST: 14 U/L (ref 0–37)
Albumin: 4.3 g/dL (ref 3.5–5.2)
Alkaline Phosphatase: 97 U/L (ref 39–117)
BUN: 7 mg/dL (ref 6–23)
CO2: 28 meq/L (ref 19–32)
Calcium: 9.3 mg/dL (ref 8.4–10.5)
Chloride: 103 meq/L (ref 96–112)
Creatinine, Ser: 0.42 mg/dL (ref 0.40–1.20)
GFR: 97.38 mL/min (ref >60.00)
Glucose, Bld: 95 mg/dL (ref 70–99)
Potassium: 3.6 meq/L (ref 3.5–5.1)
Sodium: 141 meq/L (ref 135–145)
Total Bilirubin: 0.3 mg/dL (ref 0.2–1.2)
Total Protein: 7.3 g/dL (ref 6.0–8.3)

## 2024-01-22 LAB — LIPID PANEL
Cholesterol: 241 mg/dL — ABNORMAL HIGH (ref 0–200)
HDL: 52.9 mg/dL (ref >39.00)
LDL Cholesterol: 164 mg/dL — ABNORMAL HIGH (ref 0–99)
NonHDL: 188.22
Total CHOL/HDL Ratio: 5
Triglycerides: 119 mg/dL (ref 0.0–149.0)
VLDL: 23.8 mg/dL (ref 0.0–40.0)

## 2024-01-22 LAB — CBC
HCT: 40.6 % (ref 36.0–46.0)
Hemoglobin: 13.5 g/dL (ref 12.0–15.0)
MCHC: 33.3 g/dL (ref 30.0–36.0)
MCV: 102.8 fl — ABNORMAL HIGH (ref 78.0–100.0)
Platelets: 331 10*3/uL (ref 150.0–400.0)
RBC: 3.95 Mil/uL (ref 3.87–5.11)
RDW: 12.9 % (ref 11.5–15.5)
WBC: 8.4 10*3/uL (ref 4.0–10.5)

## 2024-01-22 LAB — IRON: Iron: 76 ug/dL (ref 42–145)

## 2024-01-22 LAB — FERRITIN: Ferritin: 69.7 ng/mL (ref 10.0–291.0)

## 2024-01-22 LAB — HEMOGLOBIN A1C: Hgb A1c MFr Bld: 5.5 % (ref 4.6–6.5)

## 2024-01-22 LAB — VITAMIN B12: Vitamin B-12: 601 pg/mL (ref 211–911)

## 2024-01-22 MED ORDER — AMLODIPINE BESYLATE 10 MG PO TABS: 5.0000 mg | ORAL_TABLET | Freq: Every day | ORAL | Status: DC

## 2024-01-22 NOTE — Assessment & Plan Note (Signed)
 Status post insertion of endovascular thoracic aortic stent graft. Following with vascular surgeon, she is due for office visit. She reported some " funny feeling" in the middle of her chest, which she has had since surgery and not exertional. We discussed the importance of having adequate control hypertension and HR < 100/min. She was instructed to resume carvedilol 25 mg 1/2 tablet twice daily. Instructed about warning signs.

## 2024-01-22 NOTE — Assessment & Plan Note (Addendum)
 Following with orthopedics, she is receiving epidural injection regularly. She is also on Lyrica and chronic opioid treatment. She is not interested in exploring surgical options. Continue follow-up with Dr. Denyse Amass.

## 2024-01-22 NOTE — Assessment & Plan Note (Signed)
 Problem seems to be stable, she has some discomfort. She has been evaluated by surgery in the past, she is not a good candidate for elective surgery. Instructed about warning signs.

## 2024-01-22 NOTE — Patient Instructions (Addendum)
 A few things to remember from today's visit:  Essential hypertension  Dissection of aorta, unspecified portion of aorta (HCC)  Incisional hernia, without obstruction or gangrene  Lumbar radiculopathy  Resume Carvedilol 25 mg 1/2 tab 2 times daily. Decrease Amlodipine from 10 mg to 5 mg daily. Monitor pulse and blood pressure daily, if blood pressure starts going up we can increase dose of Carvedilol.  If you need refills for medications you take chronically, please call your pharmacy. Do not use My Chart to request refills or for acute issues that need immediate attention. If you send a my chart message, it may take a few days to be addressed, specially if I am not in the office.  Please be sure medication list is accurate. If a new problem present, please set up appointment sooner than planned today.

## 2024-01-22 NOTE — Assessment & Plan Note (Addendum)
 With recent episode of hypotension, which was most likely related to acute illness.  She is reporting that her appetite is back to baseline and drinking plenty of fluids. BP today adequately controlled. She has not been taking carvedilol for the past 4 days, recommend resuming it at 12.5 mg twice daily. Decrease amlodipine dose from 10 mg to 5 mg daily. Stressed the importance of monitoring BP closely, recommend checking BP at home daily.  If BP starts going up, we can increase dose of carvedilol back to 25 mg twice daily. Continue low-salt diet. Follow-up in 2 months, before if needed.

## 2024-01-25 ENCOUNTER — Other Ambulatory Visit: Payer: Self-pay

## 2024-01-25 MED ORDER — ATORVASTATIN CALCIUM 40 MG PO TABS: 40.0000 mg | ORAL_TABLET | Freq: Every day | ORAL | 2 refills | Status: AC

## 2024-02-04 DIAGNOSIS — L538 Other specified erythematous conditions: Secondary | ICD-10-CM | POA: Diagnosis not present

## 2024-02-04 DIAGNOSIS — L820 Inflamed seborrheic keratosis: Secondary | ICD-10-CM | POA: Diagnosis not present

## 2024-02-04 DIAGNOSIS — L2989 Other pruritus: Secondary | ICD-10-CM | POA: Diagnosis not present

## 2024-02-09 ENCOUNTER — Other Ambulatory Visit: Payer: Self-pay

## 2024-02-09 ENCOUNTER — Emergency Department (HOSPITAL_BASED_OUTPATIENT_CLINIC_OR_DEPARTMENT_OTHER)
Admission: EM | Admit: 2024-02-09 | Discharge: 2024-02-09 | Disposition: A | Discharge status: Home / Self Care | Attending: Emergency Medicine | Admitting: Emergency Medicine

## 2024-02-09 ENCOUNTER — Emergency Department (HOSPITAL_BASED_OUTPATIENT_CLINIC_OR_DEPARTMENT_OTHER)

## 2024-02-09 ENCOUNTER — Encounter (HOSPITAL_BASED_OUTPATIENT_CLINIC_OR_DEPARTMENT_OTHER): Payer: Self-pay | Admitting: Emergency Medicine

## 2024-02-09 DIAGNOSIS — M542 Cervicalgia: Secondary | ICD-10-CM

## 2024-02-09 DIAGNOSIS — I6782 Cerebral ischemia: Secondary | ICD-10-CM | POA: Diagnosis not present

## 2024-02-09 DIAGNOSIS — I6381 Other cerebral infarction due to occlusion or stenosis of small artery: Secondary | ICD-10-CM | POA: Diagnosis not present

## 2024-02-09 DIAGNOSIS — E042 Nontoxic multinodular goiter: Secondary | ICD-10-CM | POA: Diagnosis not present

## 2024-02-09 DIAGNOSIS — R519 Headache, unspecified: Secondary | ICD-10-CM | POA: Insufficient documentation

## 2024-02-09 DIAGNOSIS — E049 Nontoxic goiter, unspecified: Secondary | ICD-10-CM | POA: Insufficient documentation

## 2024-02-09 DIAGNOSIS — Z21 Asymptomatic human immunodeficiency virus [HIV] infection status: Secondary | ICD-10-CM | POA: Insufficient documentation

## 2024-02-09 DIAGNOSIS — Z859 Personal history of malignant neoplasm, unspecified: Secondary | ICD-10-CM | POA: Insufficient documentation

## 2024-02-09 DIAGNOSIS — I7100 Dissection of unspecified site of aorta: Secondary | ICD-10-CM | POA: Diagnosis not present

## 2024-02-09 LAB — CBC WITH DIFFERENTIAL/PLATELET
Abs Immature Granulocytes: 0.03 10*3/uL (ref 0.00–0.07)
Basophils Absolute: 0 10*3/uL (ref 0.0–0.1)
Basophils Relative: 0 %
Eosinophils Absolute: 0.2 10*3/uL (ref 0.0–0.5)
Eosinophils Relative: 2 %
HCT: 37.8 % (ref 36.0–46.0)
Hemoglobin: 12 g/dL (ref 12.0–15.0)
Immature Granulocytes: 0 %
Lymphocytes Relative: 26 %
Lymphs Abs: 3 10*3/uL (ref 0.7–4.0)
MCH: 33.2 pg (ref 26.0–34.0)
MCHC: 31.7 g/dL (ref 30.0–36.0)
MCV: 104.7 fL — ABNORMAL HIGH (ref 80.0–100.0)
Monocytes Absolute: 0.7 10*3/uL (ref 0.1–1.0)
Monocytes Relative: 7 %
Neutro Abs: 7.4 10*3/uL (ref 1.7–7.7)
Neutrophils Relative %: 65 %
Platelets: 310 10*3/uL (ref 150–400)
RBC: 3.61 MIL/uL — ABNORMAL LOW (ref 3.87–5.11)
RDW: 12.1 % (ref 11.5–15.5)
WBC: 11.4 10*3/uL — ABNORMAL HIGH (ref 4.0–10.5)
nRBC: 0 % (ref 0.0–0.2)

## 2024-02-09 LAB — SEDIMENTATION RATE: Sed Rate: 62 mm/h — ABNORMAL HIGH (ref 0–22)

## 2024-02-09 LAB — BASIC METABOLIC PANEL WITH GFR
Anion gap: 12 (ref 5–15)
BUN: 8 mg/dL (ref 8–23)
CO2: 25 mmol/L (ref 22–32)
Calcium: 9.3 mg/dL (ref 8.9–10.3)
Chloride: 105 mmol/L (ref 98–111)
Creatinine, Ser: 0.42 mg/dL — ABNORMAL LOW (ref 0.44–1.00)
GFR, Estimated: >60 mL/min (ref >60)
Glucose, Bld: 80 mg/dL (ref 70–99)
Potassium: 3.8 mmol/L (ref 3.5–5.1)
Sodium: 142 mmol/L (ref 135–145)

## 2024-02-09 LAB — TROPONIN T, HIGH SENSITIVITY
Troponin T High Sensitivity: <15 ng/L (ref <19)
Troponin T High Sensitivity: <15 ng/L (ref <19)

## 2024-02-09 MED ORDER — DIPHENHYDRAMINE HCL 50 MG/ML IJ SOLN
12.5000 mg | Freq: Once | INTRAMUSCULAR | Status: AC
Start: 2024-02-09 — End: 2024-02-09
  Administered 2024-02-09: 12.5 mg via INTRAVENOUS
  Filled 2024-02-09: qty 1

## 2024-02-09 MED ORDER — HYDROMORPHONE HCL 1 MG/ML IJ SOLN
0.5000 mg | Freq: Once | INTRAMUSCULAR | Status: AC
  Administered 2024-02-09: 0.5 mg via INTRAVENOUS
  Filled 2024-02-09: qty 1

## 2024-02-09 MED ORDER — PROCHLORPERAZINE EDISYLATE 10 MG/2ML IJ SOLN
10.0000 mg | Freq: Once | INTRAMUSCULAR | Status: AC
  Administered 2024-02-09: 10 mg via INTRAVENOUS
  Filled 2024-02-09: qty 2

## 2024-02-09 MED ORDER — IOHEXOL 350 MG/ML SOLN
75.0000 mL | Freq: Once | INTRAVENOUS | Status: AC | PRN
  Administered 2024-02-09: 75 mL via INTRAVENOUS

## 2024-02-09 MED ORDER — ONDANSETRON HCL 4 MG/2ML IJ SOLN
4.0000 mg | Freq: Once | INTRAMUSCULAR | Status: AC
  Administered 2024-02-09: 4 mg via INTRAVENOUS
  Filled 2024-02-09: qty 2

## 2024-02-09 MED ORDER — CARBAMAZEPINE ER 100 MG PO TB12: 100.0000 mg | ORAL_TABLET | Freq: Two times a day (BID) | ORAL | 0 refills | Status: AC

## 2024-02-09 MED ORDER — HYDROCODONE-ACETAMINOPHEN 5-325 MG PO TABS: 1.0000 | ORAL_TABLET | Freq: Four times a day (QID) | ORAL | 0 refills | Status: DC | PRN

## 2024-02-09 NOTE — Discharge Instructions (Signed)
 Please read and follow all provided instructions.  Your diagnoses today include:  1. Enlarged thyroid    2. Acute nonintractable headache, unspecified headache type   3. Neck pain on right side   4. Facial pain, acute     Tests performed today include: CT of your head and neck which showed an enlarged thyroid , stable aneurysm in the brain, no tears in the blood vessels or bleeding Complete blood cell count: Slightly high white blood cell count but no major problems Basic metabolic panel: No concerns Inflammatory marker test was elevated Blood tests for the heart were normal Pregnancy test (urine or blood, in women only): Vital signs. See below for your results today.   Medications:  In the Emergency Department you received: Compazine  - antinausea/headache medication Benadryl  - antihistamine to counteract potential side effects of reglan   For home: Vicodin (hydrocodone /acetaminophen ) - narcotic pain medication  DO NOT drive or perform any activities that require you to be awake and alert because this medicine can make you drowsy. BE VERY CAREFUL not to take multiple medicines containing Tylenol  (also called acetaminophen ). Doing so can lead to an overdose which can damage your liver and cause liver failure and possibly death.  Carbamazepine  - medication for nerve pain, you may try this to see if it helps calm down the pain in the face and head  Take any prescribed medications only as directed.  Additional information:  Follow any educational materials contained in this packet.  You are having a headache. No specific cause was found today for your headache. It may have been a migraine or other cause of headache. Stress, anxiety, fatigue, and depression are common triggers for headaches.   Your headache today does not appear to be life-threatening or require hospitalization, but often the exact cause of headaches is not determined in the emergency department. Therefore, follow-up  with your doctor is very important to find out what may have caused your headache and whether or not you need any further diagnostic testing or treatment.   Sometimes headaches can appear benign (not harmful), but then more serious symptoms can develop which should prompt an immediate re-evaluation by your doctor or the emergency department.  BE VERY CAREFUL not to take multiple medicines containing Tylenol  (also called acetaminophen ). Doing so can lead to an overdose which can damage your liver and cause liver failure and possibly death.   Follow-up instructions: Please follow-up with your primary care provider in the next 3 days for further evaluation of your symptoms.  I have also placed a referral to neurology on your behalf for their opinion.  Return instructions:  Please return to the Emergency Department if you experience worsening symptoms. Return if the medications do not resolve your headache, if it recurs, or if you have multiple episodes of vomiting or cannot keep down fluids. Return if you have a change from the usual headache. RETURN IMMEDIATELY IF you: Develop a sudden, severe headache Develop confusion or become poorly responsive or faint Develop a fever above 100.78F or problem breathing Have a change in speech, vision, swallowing, or understanding Develop new weakness, numbness, tingling, incoordination in your arms or legs Have a seizure Please return if you have any other emergent concerns.  Additional Information:  Your vital signs today were: BP (!) 120/57   Pulse 72   Temp (!) 97.4 F (36.3 C) (Oral)   Resp 11   SpO2 95%  If your blood pressure (BP) was elevated above 135/85 this visit, please have this repeated  by your doctor within one month. --------------

## 2024-02-09 NOTE — ED Triage Notes (Signed)
 Right side sharp pain from neck to head Headache Started last Thursday but getting worse Not relieved with home meds  Visual disturbance

## 2024-02-09 NOTE — ED Provider Notes (Signed)
 [place] [facility] DEPARTMENT AT [facility] Provider Note   CSN: [id number] Arrival date & time: 02/09/24  1158     History  Chief Complaint  Patient presents with   Headache    Darlene Griffith is a 73 y.o. female.  Patient with history of cervical discectomy and decompression C6/C7 in 2020, history of 6 x 4 x 4 mm left A2 aneurysm noted on previous CTA head (most recently June 2022), history of type B aortic dissection status post endovascular aortic repair, left common and external iliac stenting, left common iliac to common femoral artery bypass --presents to the emergency department today for evaluation of headache that started on the morning of 4/18.  No falls or injuries.  She reports pain that started in the right neck and has gradually worsened.  She now has pain in the right side of the head.  Particularly bothersome are sharp shooting pains that she has intermittently in the right face.  She has some blurry vision in her right eye but no loss of vision.  Patient denies signs of stroke including: facial droop, slurred speech, aphasia, weakness/numbness in extremities, imbalance/trouble walking.  She took pain pills and slept most of the day on 4/19.  She also reports taking Goody powder.  Symptoms persistent today prompting emergency department visit. //////    Date of surgery: 07/05/2019 Preoperative diagnosis: Herniated nucleus pulposus C6-C7 with spondylosis and myelopathy. Procedure: Anterior cervical discectomy and decompression of the common dural tube to C7 nerve roots.  Arthroplasty C6-C7 with mobile-C implant measuring 15 x 15 x 5 mm.,  Fluoroscopic guidance. //////  04/10/21: CTA HEAD FINDINGS   Anterior circulation: Petrous, cavernous, and supraclinoid segments widely patent without stenosis. A1 segments patent bilaterally. Normal anterior communicating artery complex. Left A2 aneurysm measures 6 x 4 x 4 mm, similar to previous. Both ACAs remain  widely patent to their distal aspects. No M1 stenosis or occlusion. Normal MCA bifurcations. Distal MCA branches well perfused and symmetric.   Posterior circulation: Both V4 segments widely patent to the vertebrobasilar junction without stenosis. Left vertebral artery slightly dominant. Right PICA origin patent and normal. Left PICA not seen. Basilar widely patent to its distal aspect. Superior cerebral arteries patent bilaterally. Both PCAs primarily supplied via the basilar well perfused or distal aspects. //////  DATE OF SERVICE: 11/11/2021 PRE-OPERATIVE DIAGNOSIS:  symptomatic type B aortic dissection despite medical therapy POST-OPERATIVE DIAGNOSIS:  Same, intraoperative left external iliac disruption PROCEDURE:   1) bilateral ultrasound-guided common femoral artery access 2) left brachial artery exposure and percutaneous access 3) thoracic branched endovascular aortic repair (see below for device details) 4) left common and external iliac artery stenting (11 x 79mm VBX x2) 5) left common iliac to common femoral artery bypass with 8 mm externally supported PTFE        Home Medications Prior to Admission medications   Medication Sig Start Date End Date Taking? Authorizing Provider  acetaminophen  (TYLENOL ) 325 MG tablet Take 1-2 tablets (325-650 mg total) by mouth every 4 (four) hours as needed for mild pain. 12/03/21   Setzer, Sandra J, PA-C  amLODipine  (NORVASC ) 10 MG tablet Take 0.5 tablets (5 mg total) by mouth daily. 01/22/24   [place], Betty G, MD  ascorbic acid  (VITAMIN C) 1000 MG tablet Take 1 tablet (1,000 mg total) by mouth daily. 03/18/22   [place], Betty G, MD  atorvastatin  (LIPITOR) 40 MG tablet Take 1 tablet (40 mg total) by mouth daily. 01/25/24   [place], Betty G, MD  carvedilol  (COREG ) 25 MG tablet Take 1 tablet (25 mg total) by mouth 2 (two) times daily with a meal. Patient taking differently: Take 12.5 mg by mouth 2 (two) times daily with a meal. 12/14/23   [place],  Betty G, MD  clotrimazole -betamethasone  (LOTRISONE ) cream Apply to affected area 2 times daily prn 04/24/20   Stephany Ehrich, MD  COVID-19 mRNA vaccine, Pfizer, (COMIRNATY ) syringe Inject into the muscle. 07/30/23   Liane Redman, MD  diclofenac  Sodium (VOLTAREN ) 1 % GEL Apply 4 g topically 4 (four) times daily. 04/28/21   Floyd, Dan, DO  DULoxetine  (CYMBALTA ) 30 MG capsule Take 1 capsule (30 mg total) by mouth at bedtime. 01/02/22   Raulkar, Keven Pel, MD  fluticasone  (FLONASE ) 50 MCG/ACT nasal spray Place 2 sprays into both nostrils at bedtime as needed for allergies or rhinitis. 12/19/22   [place], Betty G, MD  HYDROcodone -acetaminophen  (NORCO/VICODIN) 5-325 MG tablet Take 1 tablet by mouth every 6 (six) hours as needed. 06/29/23   Syliva Even, MD  ibuprofen  (ADVIL ) 800 MG tablet Take 1 tablet (800 mg total) by mouth 2 (two) times daily as needed. 05/02/23   Raspet, Erin K, PA-C  Ipratropium-Albuterol  (COMBIVENT  RESPIMAT) 20-100 MCG/ACT AERS respimat Inhale 1 puff into the lungs in the morning, at noon, and at bedtime. 12/19/22   [place], Betty G, MD  losartan  (COZAAR ) 25 MG tablet Take 1 tablet (25 mg total) by mouth daily. 03/18/22   [place], Betty G, MD  methimazole  (TAPAZOLE ) 5 MG tablet Take 0.5 tablets (2.5 mg total) by mouth 3 (three) times daily. Keep appt with endocrinologist 11/2022 09/30/22   [place], Betty G, MD  methocarbamol  (ROBAXIN ) 500 MG tablet Take 1 tablet (500 mg total) by mouth 2 (two) times daily as needed for muscle spasms. 11/13/23   Coleman Daughters, MD  mirabegron  ER (MYRBETRIQ ) 25 MG TB24 tablet Take 1 tablet (25 mg total) by mouth daily. 12/19/22   [place], Betty G, MD  nitroGLYCERIN  (NITROSTAT ) 0.4 MG SL tablet Place 1 tablet (0.4 mg total) under the tongue every 5 (five) minutes as needed for chest pain. 01/04/23   Curatolo, Adam, DO  nystatin  (MYCOSTATIN /NYSTOP ) powder Apply 1 Application topically 3 (three) times daily. 12/04/23   [place], Betty G, MD  pantoprazole  (PROTONIX ) 40  MG tablet Take 1 tablet (40 mg total) by mouth at bedtime. 03/18/22   [place], Betty G, MD  polyethylene glycol (MIRALAX  / GLYCOLAX ) 17 g packet Take 17 g by mouth daily as needed for mild constipation. 12/03/21   Setzer, Sandra J, PA-C  predniSONE  (DELTASONE ) 50 MG tablet Take 1 tablet (50 mg total) by mouth daily. 12/03/23   Corey, Evan S, MD  pregabalin  (LYRICA ) 75 MG capsule Take 1 capsule (75 mg total) by mouth 2 (two) times daily as needed (nerve pain). 12/03/23   Corey, Evan S, MD  prochlorperazine  (COMPAZINE ) 5 MG tablet Take 1-2 tablets (5-10 mg total) by mouth every 6 (six) hours as needed for nausea. 12/03/21   Setzer, Sandra J, PA-C  senna (SENOKOT) 8.6 MG TABS tablet Take 2 tablets (17.2 mg total) by mouth at bedtime. 12/03/21   Setzer, Sandra J, PA-C  traZODone  (DESYREL ) 50 MG tablet Take 0.5-1 tablets (25-50 mg total) by mouth at bedtime as needed for sleep. 12/25/21   Syliva Even, MD  valACYclovir  (VALTREX ) 1000 MG tablet Take 1 tablet (1,000 mg total) by mouth 3 (three) times daily. 06/29/23   Syliva Even, MD      Allergies  Shellfish allergy, Gabapentin , Hm lidocaine  patch [lidocaine ], and Tape    Review of Systems   Review of Systems  Physical Exam Updated Vital Signs BP 123/66 (BP Location: Left Arm)   Pulse 81   Temp 98 F (36.7 C)   Resp 18   SpO2 99%   Physical Exam Vitals and nursing note reviewed.  Constitutional:      General: She is not in acute distress.    Appearance: She is well-developed.     Comments: Appears uncomfortable  HENT:     Head: Normocephalic and atraumatic.     Comments: Patient reports pain over the right neck and the side of the head, including the temporal area    Right Ear: Tympanic membrane, ear canal and external ear normal.     Left Ear: Tympanic membrane, ear canal and external ear normal.     Nose: Nose normal.     Mouth/Throat:     Pharynx: Uvula midline.  Eyes:     General: Lids are normal. Vision grossly intact.      Extraocular Movements:     Right eye: No nystagmus.     Left eye: No nystagmus.     Conjunctiva/sclera: Conjunctivae normal.     Pupils: Pupils are equal, round, and reactive to light.     Comments: Patient grossly normal  Cardiovascular:     Rate and Rhythm: Normal rate and regular rhythm.     Heart sounds: No murmur heard. Pulmonary:     Effort: Pulmonary effort is normal. No respiratory distress.     Breath sounds: Normal breath sounds. No wheezing, rhonchi or rales.  Abdominal:     Palpations: Abdomen is soft.     Tenderness: There is no abdominal tenderness. There is no guarding or rebound.  Musculoskeletal:     Cervical back: Normal range of motion and neck supple. No tenderness or bony tenderness.     Right lower leg: No edema.     Left lower leg: No edema.  Skin:    General: Skin is warm and dry.     Findings: No rash.  Neurological:     General: No focal deficit present.     Mental Status: She is alert and oriented to person, place, and time. Mental status is at baseline.     GCS: GCS eye subscore is 4. GCS verbal subscore is 5. GCS motor subscore is 6.     Cranial Nerves: No cranial nerve deficit.     Sensory: No sensory deficit.     Motor: No weakness.     Coordination: Coordination normal.     Gait: Gait normal.     Comments: Upper extremity myotomes tested bilaterally:  C5 Shoulder abduction 5/5 C6 Elbow flexion/wrist extension 5/5 C7 Elbow extension 5/5 C8 Finger flexion 5/5 T1 Finger abduction 5/5  Lower extremity myotomes tested bilaterally: L2 Hip flexion 5/5 L3 Knee extension 5/5 L4 Ankle dorsiflexion 5/5 S1 Ankle plantar flexion 5/5   Psychiatric:        Mood and Affect: Mood normal.     ED Results / Procedures / Treatments   Labs (all labs ordered are listed, but only abnormal results are displayed) Labs Reviewed  BASIC METABOLIC PANEL WITH GFR - Abnormal; Notable for the following components:      Result Value   Creatinine, Ser 0.42 (*)     All other components within normal limits  CBC WITH DIFFERENTIAL/PLATELET - Abnormal; Notable for the following components:   WBC 11.4 (*)  RBC 3.61 (*)    MCV 104.7 (*)    All other components within normal limits  SEDIMENTATION RATE - Abnormal; Notable for the following components:   Sed Rate 62 (*)    All other components within normal limits  CBC WITH DIFFERENTIAL/PLATELET  TROPONIN T, HIGH SENSITIVITY  TROPONIN T, HIGH SENSITIVITY    ED ECG REPORT   Date: 02/09/2024  Rate: 83  Rhythm: normal sinus rhythm  QRS Axis: normal  Intervals: normal  ST/T Wave abnormalities: normal  Conduction Disutrbances:none  Narrative Interpretation:   Old EKG Reviewed: unchanged  I have personally reviewed the EKG tracing and agree with the computerized printout as noted.   Radiology No results found.  Procedures Procedures    Medications Ordered in ED Medications  HYDROmorphone  (DILAUDID ) injection 0.5 mg (0.5 mg Intravenous Given 02/09/24 1345)  ondansetron  (ZOFRAN ) injection 4 mg (4 mg Intravenous Given 02/09/24 1345)  iohexol  (OMNIPAQUE ) 350 MG/ML injection 75 mL (75 mLs Intravenous Contrast Given 02/09/24 1609)  HYDROmorphone  (DILAUDID ) injection 0.5 mg (0.5 mg Intravenous Given 02/09/24 1659)  prochlorperazine  (COMPAZINE ) injection 10 mg (10 mg Intravenous Given 02/09/24 1737)  diphenhydrAMINE  (BENADRYL ) injection 12.5 mg (12.5 mg Intravenous Given 02/09/24 1736)    ED Course/ Medical Decision Making/ A&P    Patient seen and examined. History obtained directly from patient.  She is a poor historian.  I reviewed various surgical procedures and admissions over the past few years along with imaging results.  Labs/EKG: Ordered CBC, BMP, troponin, ESR.  Imaging: Ordered CTA head and neck --patient does not currently complain of any chest or back pain similar to previous with her aortic dissection.  Discussed with Dr. Martina Sledge.  Medications/Fluids: Dilaudid , zofran   Most recent vital  signs reviewed and are as follows: BP (!) 146/72   Pulse 83   Temp 98 F (36.7 C)   Resp 16   SpO2 97%   Initial impression: Headache, multiple comorbid conditions  5:58 PM patient with continued symptoms on multiple rechecks.  No neurologic decompensation.  Patient discussed earlier with and seen by Dr. Florie Husband.  Currently awaiting results of imaging.  Labs personally reviewed and interpreted including: CBC with mildly elevated white blood cell count at 11.4, normal hemoglobin, MCV elevated; BMP unremarkable; sed rate elevated at 62; troponin less than 15.  Imaging personally visualized and interpreted including: CT angio neck pending.  Most current vital signs reviewed and are as follows: BP (!) 115/53   Pulse 70   Temp (!) 97.4 F (36.3 C) (Oral)   Resp 13   SpO2 96%   Plan: Will also give migraine treatment.  We considered temporal arteritis as potential cause.  After evaluation by attending physician, feel that this is less likely.  Awaiting results at this time.  6:36 PM CT results not crossing over. Enlarged thyroid , stable ACA aneurysm, vascular stents noted.  6:44 PM Reassessment performed. Patient appears more comfortable. States pain has eased a bit but still has some intermittent shooting pains in the R face. We talked about the enlarged thyroid  and need for follow-up with her PCP.   Most current vital signs reviewed and are as follows: BP (!) 115/53   Pulse 70   Temp (!) 97.4 F (36.3 C) (Oral)   Resp 13   SpO2 96%   Plan: Discussed with Dr. Florie Husband. Will initiate carbamazepine  100 mg twice daily for facial pain. Symptoms overall are less suggestive of temporal arteritis at this time, especially with the shooting intermittent nature.  Will  hold off on steroids as we do not feel that the risk of this medication outweighs the benefits at this time.  Referral placed to neurology.  Will give # 8 tablets Vicodin to use at home for more severe pain if needed.  Patient  will continue home medications including Cymbalta .  I asked her to follow-up with her PCP regarding the enlarged thyroid  to make sure that she has had this appropriately evaluated.  This appears to be a chronic issue.  Prescriptions written for: Carbamazepine , Vicodin  Patient counseled on use of narcotic pain medications. Counseled not to combine these medications with others containing tylenol . Urged not to drink alcohol, drive, or perform any other activities that requires focus while taking these medications. The patient verbalizes understanding and agrees with the plan.  Other home care instructions discussed: Monitoring of symptoms, avoidance of triggers  ED return instructions discussed: Patient counseled to return if they have weakness in their arms or legs, slurred speech, trouble walking or talking, confusion, trouble with their balance, or if they have any other concerns. Patient verbalizes understanding and agrees with plan.   Follow-up instructions discussed: Patient encouraged to follow-up with their PCP in 3 days, neurology in 1 week.                                 Medical Decision Making Amount and/or Complexity of Data Reviewed Labs: ordered. Radiology: ordered.  Risk Prescription drug management.   In regards to the patient's headache and neck pain, critical differentials were considered including subarachnoid hemorrhage, intracerebral hemorrhage, epidural/subdural hematoma, pituitary apoplexy, vertebral/carotid artery dissection, giant cell arteritis, central venous thrombosis, reversible cerebral vasoconstriction, acute angle closure glaucoma, idiopathic intracranial hypertension, bacterial meningitis, viral encephalitis, carbon monoxide poisoning, posterior reversible encephalopathy syndrome, pre-eclampsia.   Reg flag symptoms related to these causes were considered including systemic symptoms (fever, weight loss), neurologic symptoms (confusion, mental status change,  vision change, associated seizure), acute or sudden "thunderclap" onset, patient age 65 or older with new or progressive headache, patient of any age with first headache or change in headache pattern, pregnant or postpartum status, history of HIV or other immunocompromise, history of cancer, headache occurring with exertion, associated neck or shoulder pain, associated traumatic injury, concurrent use of anticoagulation, family history of spontaneous SAH, and concurrent drug use.    Other benign, more common causes of headache were considered including migraine, tension-type headache, cluster headache, referred pain from other cause such as sinus infection, dental pain, trigeminal neuralgia.   On exam, patient has a reassuring neuro exam including baseline mental status. Pain improved with IV meds/migraine cocktail but not completely gone.  Will start on carbamazepine  to treat possible trigeminal neuralgia as her symptoms are most consistent with this.  Less likely temporal arteritis at this time.  Patient without any concerning visual findings.  The patient's vital signs, pertinent lab work and imaging were reviewed and interpreted as discussed in the ED course. Hospitalization was considered for further testing, treatments, or serial exams/observation. However as patient is well-appearing, has a stable exam over the course of their evaluation, and reassuring studies today, I do not feel that they warrant admission at this time. This plan was discussed with the patient who verbalizes agreement and comfort with this plan and seems reliable and able to return to the Emergency Department with worsening or changing symptoms.          Final Clinical Impression(s) / ED  Diagnoses Final diagnoses:  Enlarged thyroid   Acute nonintractable headache, unspecified headache type  Neck pain on right side  Facial pain, acute    Rx / DC Orders ED Discharge Orders          Ordered    carbamazepine   (TEGRETOL -XR) 100 MG 12 hr tablet  2 times daily        02/09/24 1900    HYDROcodone -acetaminophen  (NORCO/VICODIN) 5-325 MG tablet  Every 6 hours PRN        02/09/24 1900    Ambulatory referral to Neurology       Comments: An appointment is requested in approximately: 1 week   02/09/24 1900              Lyna Sandhoff, PA-C 02/09/24 1910    Afton Horse T, DO 02/13/24 1555

## 2024-02-10 ENCOUNTER — Other Ambulatory Visit: Payer: Self-pay | Admitting: Family Medicine

## 2024-02-10 DIAGNOSIS — E0590 Thyrotoxicosis, unspecified without thyrotoxic crisis or storm: Secondary | ICD-10-CM

## 2024-02-12 DIAGNOSIS — E042 Nontoxic multinodular goiter: Secondary | ICD-10-CM | POA: Diagnosis not present

## 2024-02-12 DIAGNOSIS — E0590 Thyrotoxicosis, unspecified without thyrotoxic crisis or storm: Secondary | ICD-10-CM | POA: Diagnosis not present

## 2024-02-18 ENCOUNTER — Ambulatory Visit (INDEPENDENT_AMBULATORY_CARE_PROVIDER_SITE_OTHER): Admitting: Neurology

## 2024-02-18 ENCOUNTER — Encounter: Payer: Self-pay | Admitting: Neurology

## 2024-02-18 VITALS — BP 135/80 | HR 79 | Ht 61.0 in | Wt 152.0 lb

## 2024-02-18 DIAGNOSIS — R519 Headache, unspecified: Secondary | ICD-10-CM | POA: Insufficient documentation

## 2024-02-18 DIAGNOSIS — M542 Cervicalgia: Secondary | ICD-10-CM | POA: Diagnosis not present

## 2024-02-18 DIAGNOSIS — G43709 Chronic migraine without aura, not intractable, without status migrainosus: Secondary | ICD-10-CM | POA: Diagnosis not present

## 2024-02-18 MED ORDER — BUPIVACAINE HCL 0.5 % IJ SOLN
1.5000 mL | Freq: Once | INTRAMUSCULAR | Status: AC
Start: 2024-02-18 — End: ?

## 2024-02-18 MED ORDER — BETAMETHASONE SOD PHOS & ACET 6 (3-3) MG/ML IJ SUSP: 9.0000 mg | Freq: Once | INTRAMUSCULAR | Status: AC

## 2024-02-18 MED ORDER — BUTALBITAL-APAP-CAFFEINE 50-325-40 MG PO TABS: 1.0000 | ORAL_TABLET | Freq: Four times a day (QID) | ORAL | 5 refills | Status: AC | PRN

## 2024-02-18 NOTE — Progress Notes (Signed)
 Chief Complaint  Patient presents with   New Patient (Initial Visit)    Pt in 5, here alone Pt is referred for acute headache and facial pain. Pt states her thyroid  is very enlarged and awaiting to see surgeon. Pt states the shooting pain starts on on her neck and radiates to the right side in back of her ear and the head. Pt also states she has a pain on her left thigh, states is painful to the touch.       ASSESSMENT AND PLAN  Darlene Griffith is a 73 y.o. female   History of cervical decompression surgery for left cervical radiculopathy Acute onset of right neck pain radiating pain to right parietal temporal region, significant tenderness at right nuchal line  Suggestive of right greater occipital neuralgia  Trigger point injection today  Suggested warm compression, neck stretching exercise  Fioricet as needed  Elevated ESR at recent laboratory evaluation will repeat  DIAGNOSTIC DATA (LABS, IMAGING, TESTING) - I reviewed patient records, labs, notes, testing and imaging myself where available.   MEDICAL HISTORY:  Darlene Griffith, is a 73 year old female, brought in by transportation, alone at visit, seen in request by primary care physician Dr. [place], Betty G, for evaluation of acute onset right neck pain  History is obtained from the patient and review of electronic medical records. I personally reviewed pertinent available imaging films in PACS.   PMHx of  History of anterior cervical Test ectomy decompression of C6-7 by Dr. Authur Leghorn September 2020, presenting with progressive weakness of her left arm, Smoker, COPD HTN She had thoracic branch endovascular aortic repair left common and external iliac artery stenting, left common iliac to common femoral artery bypass January 2023 for symptomatic type B aortic dissection despite medical therapy presented with 10 out of 10 mid scapular back pain radiating to chest and abdomen,  On February 04, 2024, she presented with sudden  onset radiating pain from right neck to right parietal temporal region, rhythmic, throbbing, worsening by neck movement, lasting for about a week, is different from her migraine, which she described as retro-orbital area pressure headache, with light noise sensitivity, she had tried [facility] powder at home without helping her symptoms  The pain is so intense, she has to hold her breath,  She presented to the emergency room February 09, 2024, ESR was elevated 62,   CTA of head and neck on Apirl 22 2025, small vessel disease, remote infarction and right basal ganglion, no acute intracranial abnormalities atherosclerotic change, but no large vessel occlusion, 4 x 3 x 4 mm aneurysm involving the mid A2 segment of the left ACA. Partially visualized aortic endograft stent noted with branch stent extending into the proximal left subclavian artery. Significant enlargement of the thyroid  gland with multiple nodules measuring up to 2.2 cm.     She was given hydrocodone , Tegretol  100 mg twice a day  with limited help  Lab in April 2025, ESR 62, normal CMP, B12, A1C 5.5, Hg 13.5, normal TSH.   CT cervical in Jan 2025,  1. No acute intracranial abnormality. 2. No acute displaced fracture or traumatic listhesis of the cervical spine. 3. Enlarged bilateral thyroid  glands with possibly increased in size markedly enlarged left thyroid  gland that extends into the superior mediastinum. This has been evaluated on previous imaging   She has chronic left anterior thigh paresthesia, been ongoing for more than a year, today has left heel pain, worsening gait abnormality because of that   PHYSICAL  EXAM:   Vitals:   02/18/24 0813  BP: 135/80  Pulse: 79  Weight: 152 lb (68.9 kg)  Height: 5\' 1"  (1.549 m)   Body mass index is 28.72 kg/m.  PHYSICAL EXAMNIATION:  Gen: NAD, conversant, well nourised, well groomed                     Cardiovascular: Regular rate rhythm, no peripheral edema, warm, nontender. Eyes:  Conjunctivae clear without exudates or hemorrhage Neck: Supple, no carotid bruits.  Significant tenderness of right nuchal area, Pulmonary: Clear to auscultation bilaterally   NEUROLOGICAL EXAM:  MENTAL STATUS: Speech/cognition: Awake, alert, oriented to history taking and casual conversation CRANIAL NERVES: CN II: Visual fields are full to confrontation. Pupils are round equal and briskly reactive to light. CN III, IV, VI: extraocular movement are normal. No ptosis. CN V: Facial sensation is intact to light touch CN VII: Face is symmetric with normal eye closure  CN VIII: Hearing is normal to causal conversation. CN IX, X: Phonation is normal. CN XI: Head turning and shoulder shrug are intact  MOTOR: There is no pronator drift of out-stretched arms. Muscle bulk and tone are normal. Muscle strength is normal.  REFLEXES: Reflexes are 1 and symmetric at the biceps, triceps, knees, and ankles. Plantar responses are flexor.  SENSORY: Intact to light touch, pinprick and vibratory sensation are intact in fingers and toes.  COORDINATION: There is no trunk or limb dysmetria noted.  GAIT/STANCE: Push-up to get up from seated position, rely on her walker significant left heel otalgia  REVIEW OF SYSTEMS:  Full 14 system review of systems performed and notable only for as above All other review of systems were negative.   ALLERGIES: Allergies  Allergen Reactions   Shellfish Allergy Anaphylaxis   Gabapentin      Other reaction(s): Confusion (intolerance)   Hm Lidocaine  Patch [Lidocaine ] Other (See Comments)    Lidocaine  patches caused burning   Tape Other (See Comments)    Pulls skin    HOME MEDICATIONS: Current Outpatient Medications  Medication Sig Dispense Refill   amLODipine  (NORVASC ) 10 MG tablet Take 0.5 tablets (5 mg total) by mouth daily.     ascorbic acid  (VITAMIN C) 1000 MG tablet Take 1 tablet (1,000 mg total) by mouth daily. 30 tablet 0   atorvastatin  (LIPITOR) 40  MG tablet Take 1 tablet (40 mg total) by mouth daily. 90 tablet 2   carbamazepine  (TEGRETOL -XR) 100 MG 12 hr tablet Take 1 tablet (100 mg total) by mouth 2 (two) times daily. 30 tablet 0   carvedilol  (COREG ) 25 MG tablet Take 1 tablet (25 mg total) by mouth 2 (two) times daily with a meal. (Patient taking differently: Take 12.5 mg by mouth 2 (two) times daily with a meal.) 180 tablet 2   clotrimazole -betamethasone  (LOTRISONE ) cream Apply to affected area 2 times daily prn 15 g 0   COVID-19 mRNA vaccine, Pfizer, (COMIRNATY ) syringe Inject into the muscle. 0.3 mL 0   diclofenac  Sodium (VOLTAREN ) 1 % GEL Apply 4 g topically 4 (four) times daily. 100 g 0   DULoxetine  (CYMBALTA ) 30 MG capsule Take 1 capsule (30 mg total) by mouth at bedtime. 30 capsule 1   fluticasone  (FLONASE ) 50 MCG/ACT nasal spray Place 2 sprays into both nostrils at bedtime as needed for allergies or rhinitis. 16 g 1   HYDROcodone -acetaminophen  (NORCO/VICODIN) 5-325 MG tablet Take 1 tablet by mouth every 6 (six) hours as needed for severe pain (pain score 7-10). 8 tablet  0   ibuprofen  (ADVIL ) 800 MG tablet Take 1 tablet (800 mg total) by mouth 2 (two) times daily as needed. 6 tablet 0   Ipratropium-Albuterol  (COMBIVENT  RESPIMAT) 20-100 MCG/ACT AERS respimat Inhale 1 puff into the lungs in the morning, at noon, and at bedtime. 4 g 4   losartan  (COZAAR ) 25 MG tablet Take 1 tablet (25 mg total) by mouth daily. 90 tablet 1   methimazole  (TAPAZOLE ) 5 MG tablet Take 0.5 tablets (2.5 mg total) by mouth 3 (three) times daily. Keep appt with endocrinologist 11/2022 90 tablet 3   methocarbamol  (ROBAXIN ) 500 MG tablet Take 1 tablet (500 mg total) by mouth 2 (two) times daily as needed for muscle spasms. 20 tablet 0   mirabegron  ER (MYRBETRIQ ) 25 MG TB24 tablet Take 1 tablet (25 mg total) by mouth daily. 90 tablet 1   nitroGLYCERIN  (NITROSTAT ) 0.4 MG SL tablet Place 1 tablet (0.4 mg total) under the tongue every 5 (five) minutes as needed for chest  pain. 30 tablet 0   nystatin  (MYCOSTATIN /NYSTOP ) powder Apply 1 Application topically 3 (three) times daily. 60 g 3   pantoprazole  (PROTONIX ) 40 MG tablet Take 1 tablet (40 mg total) by mouth at bedtime. 90 tablet 2   polyethylene glycol (MIRALAX  / GLYCOLAX ) 17 g packet Take 17 g by mouth daily as needed for mild constipation. 14 each 0   pregabalin  (LYRICA ) 75 MG capsule Take 1 capsule (75 mg total) by mouth 2 (two) times daily as needed (nerve pain). 60 capsule 1   prochlorperazine  (COMPAZINE ) 5 MG tablet Take 1-2 tablets (5-10 mg total) by mouth every 6 (six) hours as needed for nausea. 30 tablet 0   senna (SENOKOT) 8.6 MG TABS tablet Take 2 tablets (17.2 mg total) by mouth at bedtime. 60 tablet 0   traZODone  (DESYREL ) 50 MG tablet Take 0.5-1 tablets (25-50 mg total) by mouth at bedtime as needed for sleep. 20 tablet 0   valACYclovir  (VALTREX ) 1000 MG tablet Take 1 tablet (1,000 mg total) by mouth 3 (three) times daily. 21 tablet 0   No current facility-administered medications for this visit.    PAST MEDICAL HISTORY: Past Medical History:  Diagnosis Date   Chest pain    a. 01/2015 Echo: Nl LV fxn, Gr 1 DD, triv AI, mild MR.   Essential hypertension    Hepatic cyst    a. noted on CT 01/2015.   Hyperthyroidism    Multinodular goiter    a. 01/2015 CT chest: multinodular goidter w/ substernal extension of the left lobe of the thyroid  assoc w/ rightward deviation of tracheal air column.   Neck pain, chronic    Personal history of colonic polyps    Pulmonary nodules    a. 01/2015 CT Chest: RLL ~ 5mm subpleural nodule - rec f/u in 6-12 mos.   Splenic cyst    a. noted on CT 01/2015.    PAST SURGICAL HISTORY: Past Surgical History:  Procedure Laterality Date   BYPASS GRAFT AORTA TO AORTA Left 11/11/2021   Procedure: BYPASS GRAFT AORTA TO LEFT COMMON  FEMORAL ARTERY;  Surgeon: Carlene Che, MD;  [facility];  Service: Vascular;  Laterality: Left;   CERVICAL DISC ARTHROPLASTY N/A  07/05/2019   Procedure: Cervical Six-Seven Artificial disc replacement;  Surgeon: Elna Haggis, MD;  Location: [facility] OR;  Service: Neurosurgery;  Laterality: N/A;  Cervical Six-Seven Artificial disc replacement   CESAREAN SECTION     COLONOSCOPY  01/13/2020   THORACIC AORTIC ENDOVASCULAR STENT GRAFT  N/A 11/11/2021   Procedure: THORACIC AORTIC ENDOVASCULAR STENT GRAFT WITH LEFT BRACHIAL  ARTERY ACCESS;  Surgeon: Carlene Che, MD;  Location: [facility] OR;  Service: Vascular;  Laterality: N/A;   TONSILLECTOMY     AROUND 5-6 YRS OLD   ULTRASOUND GUIDANCE FOR VASCULAR ACCESS Bilateral 11/11/2021   Procedure: ULTRASOUND GUIDANCE FOR VASCULAR ACCESS;  Surgeon: Carlene Che, MD;  Location: [facility] OR;  Service: Vascular;  Laterality: Bilateral;   UPPER GASTROINTESTINAL ENDOSCOPY  01/13/2020    FAMILY HISTORY: Family History  Problem Relation Age of Onset   Heart attack Maternal Grandmother        deceased   High blood pressure Mother    High Cholesterol Mother    Thyroid  disease Mother    Thyroid  disease Sister    Lung cancer Father    Cancer Maternal Uncle        unk type   Cancer Sister        unk type   Colon cancer Neg Hx    Colon polyps Neg Hx    Esophageal cancer Neg Hx    Rectal cancer Neg Hx    Stomach cancer Neg Hx     SOCIAL HISTORY: Social History   Socioeconomic History   Marital status: Single    Spouse name: Not on file   Number of children: Not on file   Years of education: Not on file   Highest education level: Not on file  Occupational History   Occupation: retired  Tobacco Use   Smoking status: Light Smoker    Types: Cigarettes   Smokeless tobacco: Never   Tobacco comments:    1-2 NOT EVERY DAY   Vaping Use   Vaping status: Never Used  Substance and Sexual Activity   Alcohol use: Yes    Alcohol/week: 0.0 standard drinks of alcohol    Comment: rare   Drug use: No   Sexual activity: Not Currently  Other Topics Concern   Not on file  Social History  Narrative   Lives alone.  Four children and eight grands.     Social Drivers of Corporate investment banker Strain: Low Risk  (12/19/2021)   Overall Financial Resource Strain (CARDIA)    Difficulty of Paying Living Expenses: Not hard at all  Food Insecurity: No Food Insecurity (11/20/2023)   Hunger Vital Sign    Worried About Running Out of Food in the Last Year: Never true    Ran Out of Food in the Last Year: Never true  Transportation Needs: No Transportation Needs (11/20/2023)   PRAPARE - Administrator, Civil Service (Medical): No    Lack of Transportation (Non-Medical): No  Physical Activity: Inactive (03/12/2023)   Exercise Vital Sign    Days of Exercise per Week: 0 days    Minutes of Exercise per Session: 0 min  Stress: Stress Concern Present (03/12/2023)   [employer] of Occupational Health - Occupational Stress Questionnaire    Feeling of Stress : To some extent  Social Connections: Moderately Integrated (12/19/2021)   Social Connection and Isolation Panel [NHANES]    Frequency of Communication with Friends and Family: More than three times a week    Frequency of Social Gatherings with Friends and Family: More than three times a week    Attends Religious Services: More than 4 times per year    Active Member of [employer] or Organizations: Yes    Attends Banker Meetings: 1 to 4 times per  year    Marital Status: Never married  Intimate Partner Violence: Not At Risk (12/19/2021)   Humiliation, Afraid, Rape, and Kick questionnaire    Fear of Current or Ex-Partner: No    Emotionally Abused: No    Physically Abused: No    Sexually Abused: No      Phebe Brasil, M.D. Ph.D.  [facility] [address] [place], [place] [address] Ph: [phone] Fax: [phone]  CC:  Lyna Sandhoff, PA-C [address] [place],  [place] [address]  [place], Betty G, MD

## 2024-02-18 NOTE — Progress Notes (Signed)
 Procedure: Occipital Nerve injection/Trigger point injection   Location: Left occipital region   The risks, benefits and anticipated outcomes of the procedure, the risks and benefits of the alternatives to the procedure, and the roles and tasks of the personnel to be involved, were discussed with the patient, and the patient consents to the procedure and agrees to proceed.     A combination of 1.5 cc betamethasone  6 mg and 1.5 cc of 0.5% bupivacaine  were prepared.    The left greater occipital nerve was injected 3cm caudal and 1.5 cm lateral to the inion where the main trunk of the occipital nerve penetrates the semispinalis muscle.  The needle was placed perpendicular and the needle advanced 1.5 cm. After aspiration to ensure no obstruction or presence of blood, the area was injected.   The needle was repositioned in a fan-like manner and the entire area was injected.   Pressure was held and no hematoma was noted.

## 2024-02-19 ENCOUNTER — Telehealth: Payer: Self-pay | Admitting: Neurology

## 2024-02-19 DIAGNOSIS — R519 Headache, unspecified: Secondary | ICD-10-CM

## 2024-02-19 LAB — C-REACTIVE PROTEIN: CRP: 20 mg/L — ABNORMAL HIGH (ref 0–10)

## 2024-02-19 LAB — SEDIMENTATION RATE: Sed Rate: 47 mm/h — ABNORMAL HIGH (ref 0–40)

## 2024-02-19 NOTE — Telephone Encounter (Signed)
 I called both number listed, fail to reach patient, could not leave a message either  Please call patient, repeat laboratory evaluation showed elevated ESR, C-reactive protein, indicating systemic inflammatory process  Check to see if Darlene Griffith is still have significant right side neck radiating pain after trigger point injection,  I would like her to have repeat blood test, if this remains elevated may consider low-dose of prednisone  treatment  Orders Placed This Encounter  Procedures   C-reactive protein   Sedimentation rate

## 2024-02-22 NOTE — Telephone Encounter (Signed)
 Call to patient,  unable to leave message. MY chart message sent to call office

## 2024-02-23 ENCOUNTER — Ambulatory Visit (INDEPENDENT_AMBULATORY_CARE_PROVIDER_SITE_OTHER): Admitting: Family Medicine

## 2024-02-23 ENCOUNTER — Ambulatory Visit (INDEPENDENT_AMBULATORY_CARE_PROVIDER_SITE_OTHER)

## 2024-02-23 VITALS — BP 104/76 | HR 99 | Ht 61.0 in

## 2024-02-23 DIAGNOSIS — M79672 Pain in left foot: Secondary | ICD-10-CM

## 2024-02-23 DIAGNOSIS — M545 Low back pain, unspecified: Secondary | ICD-10-CM | POA: Diagnosis not present

## 2024-02-23 DIAGNOSIS — G8929 Other chronic pain: Secondary | ICD-10-CM

## 2024-02-23 DIAGNOSIS — M25572 Pain in left ankle and joints of left foot: Secondary | ICD-10-CM | POA: Diagnosis not present

## 2024-02-23 NOTE — Patient Instructions (Addendum)
 Thank you for coming in today.   Please get labs today before you leave   Please get an Xray today before you leave   Please call [facility]) at [phone] to schedule your spine injection.    Check back in 1 month

## 2024-02-23 NOTE — Progress Notes (Unsigned)
 Joanna Muck, PhD, LAT, ATC acting as a scribe for Garlan Juniper, MD.   Darlene Griffith is a 73 y.o. female who presents to [facility] today for cont'd back pain. Pt was last seen by Dr. Alease Hunter on 12/03/23 and was prescribed prednisone  and Lyrica  and new imaging was ordered. Based on new MRI, ESI was ordered, done on 12/15/23.  Today, pt reports last ESI made her back pain worse. She also c/o L foot pain ongoing since last Thurs. +swelling. No injury. Pt locates pain to the lateral aspect of her L ankle and slightly onto the plantar aspect, lateral calcaneous.   Dx imaging: 01/22/23 Vasc US  ABI 01/17/23 L-spine MRI             01/08/23 L-spine XR 12/21/21 L-spine & T-spine MRI 05/28/18 L hip/pelvis XR  Pertinent review of systems: No fevers or chills  Relevant historical information: Hypertension.  History of an aortic dissection.   Exam:  BP 104/76   Pulse 99   Ht 5\' 1"  (1.549 m)   SpO2 96%   BMI 28.72 kg/m  General: Well Developed, well nourished, and in no acute distress.   MSK: L-spine patient is leaning to the right.  Nontender palpation midline tender palpation paraspinal musculature.  Left foot and ankle: Mild ankle effusion.  Tender palpation medial lateral ankle and along the calcaneus. First MTP is nontender.    Lab and Radiology Results No results found for this or any previous visit (from the past 72 hours). DG Ankle Complete Left Result Date: 02/23/2024 CLINICAL DATA:  Left ankle pain since 02/18/2024.  No known injury. EXAM: LEFT ANKLE COMPLETE - 3+ VIEW COMPARISON:  Left tibia and fibula radiographs 06/29/2023 FINDINGS: The ankle mortise is symmetric and intact. Tiny plantar calcaneal heel spur. No acute fracture is seen. No dislocation. IMPRESSION: Tiny plantar calcaneal heel spur. Electronically Signed   By: Bertina Broccoli M.D.   On: 02/23/2024 22:13  I, Garlan Juniper, personally (independently) visualized and performed the interpretation  of the images attached in this note.      Assessment and Plan: 73 y.o. female with chronic back pain.  Patient has had some benefit in the past with epidural steroid injections.  Most recent injection at L2-3 was not helpful.  She thinks the last injection at L3-4 was helpful.  Will go ahead and repeat epidural steroid injection and likely perform it at L3-4.  Left ankle pain and swelling.  Etiology is unclear.  Gout is the most likely explanation.  X-ray does not show much abnormality.  Plan to check uric acid and metabolic panel.  Either will prescribe prednisone  or colchicine based on results.    PDMP not reviewed this encounter. Orders Placed This Encounter  Procedures   DG Ankle Complete Left    Standing Status:   Future    Number of Occurrences:   1    Expiration Date:   02/22/2025    Reason for Exam (SYMPTOM  OR DIAGNOSIS REQUIRED):   left ankle pain    Preferred imaging location?:   [facility] INJECT DIAG/THERA/INC NEEDLE/CATH/PLC EPI/LUMB/SAC W/IMG    Level and technique per radiology    Standing Status:   Future    Expiration Date:   03/25/2024    Reason for Exam (SYMPTOM  OR DIAGNOSIS REQUIRED):   Low back pain    Preferred Imaging Location?:   [facility]   Uric acid  Standing Status:   Future    Expiration Date:   02/22/2025   Comprehensive metabolic panel with GFR    Standing Status:   Future    Expiration Date:   02/22/2025   No orders of the defined types were placed in this encounter.    Discussed warning signs or symptoms. Please see discharge instructions. Patient expresses understanding.   The above documentation has been reviewed and is accurate and complete Garlan Juniper, M.D.

## 2024-02-24 NOTE — Progress Notes (Signed)
 Left ankle x-ray shows only a tiny heel spur which I do not think this is causing your pain.  No arthritis or broken bones.

## 2024-02-25 LAB — COMPREHENSIVE METABOLIC PANEL WITH GFR
ALT: 11 U/L (ref 0–35)
AST: 14 U/L (ref 0–37)
Albumin: 4.2 g/dL (ref 3.5–5.2)
Alkaline Phosphatase: 103 U/L (ref 39–117)
BUN: 9 mg/dL (ref 6–23)
CO2: 28 meq/L (ref 19–32)
Calcium: 9.2 mg/dL (ref 8.4–10.5)
Chloride: 104 meq/L (ref 96–112)
Creatinine, Ser: 0.5 mg/dL (ref 0.40–1.20)
GFR: 93.31 mL/min (ref >60.00)
Glucose, Bld: 90 mg/dL (ref 70–99)
Potassium: 3.7 meq/L (ref 3.5–5.1)
Sodium: 140 meq/L (ref 135–145)
Total Bilirubin: 0.3 mg/dL (ref 0.2–1.2)
Total Protein: 7.5 g/dL (ref 6.0–8.3)

## 2024-02-25 LAB — URIC ACID: Uric Acid, Serum: 3.5 mg/dL (ref 2.4–7.0)

## 2024-02-26 ENCOUNTER — Other Ambulatory Visit: Payer: Self-pay

## 2024-02-26 DIAGNOSIS — M79672 Pain in left foot: Secondary | ICD-10-CM

## 2024-02-26 MED ORDER — PREDNISONE 50 MG PO TABS
ORAL_TABLET | ORAL | 0 refills | Status: DC
Start: 2024-02-26 — End: 2024-03-17

## 2024-02-26 NOTE — Progress Notes (Signed)
 Kidney function looks okay.  Gout level is low.  Would you like me to prescribe prednisone ?  That could help.

## 2024-03-01 NOTE — Discharge Instructions (Signed)

## 2024-03-02 ENCOUNTER — Inpatient Hospital Stay
Admission: RE | Admit: 2024-03-02 | Discharge: 2024-03-02 | Disposition: A | Discharge status: Home / Self Care | Source: Ambulatory Visit | Attending: Family Medicine | Admitting: Family Medicine

## 2024-03-02 ENCOUNTER — Telehealth: Payer: Self-pay | Admitting: Family Medicine

## 2024-03-02 NOTE — Telephone Encounter (Signed)
 Patient went to [facility] and they told her the injection was denied. She would like someone to call her to explain why it was denied or what happened with that.

## 2024-03-08 NOTE — Telephone Encounter (Signed)
 Denial reason:  Your doctor wants to give you an injection into your lower back to help with the pain or tingling going down your leg. Your doctor told us  that this is coming from an inflamed nerve in your lower back. Your doctor also told us  that you had this injection in the past. This injection is needed again when the last injection decreased your pain by half for at least three months. You should have been able to perform your normal activities again. You should have felt better for at least three months. Your doctor also needs to tell us  that you were able to perform your normal activities for at least three months. We reviewed the notes we received. The notes do not show that your pain decreased by half for at least three months. The notes also do not show you were able to perform your normal activities for at least three months. As a result, this injection is not medically necessary. We used [employer] Medical Benefits Management Clinical Guideline titled Interventional Pain Management, Epidural Injection Procedures and Diagnostic Selective Nerve Root Blocks to make this decision. You may view this guideline at www.carelon.com/mbm-guidelines-musculoskeletal.   Forwarding to Dr. Alease Hunter to review and advise.

## 2024-03-09 NOTE — Telephone Encounter (Signed)
 Patient returned call. She states that yes, she did feel better and was able to perform activities of daily living for 3 months.

## 2024-03-09 NOTE — Telephone Encounter (Signed)
 Please confirm Danita that she felt better was able to perform activities of daily living for 3 months.

## 2024-03-09 NOTE — Telephone Encounter (Signed)
 I am trying to fill out the appeal for patients injection, But I need the notes addended to say/show  1)last injection decreased pain by half for at least three months 2)patient should of been able to perform normal activities for at least three months.    Once this is done I can start the appeal

## 2024-03-09 NOTE — Telephone Encounter (Signed)
 Called pt and spoke with her daughter. Pt is working and unable to come to the phone. I requested a call back to review questions.

## 2024-03-09 NOTE — Telephone Encounter (Signed)
 Forwarding to Dr. Denyse Amass.

## 2024-03-10 ENCOUNTER — Telehealth: Payer: Self-pay | Admitting: Neurology

## 2024-03-10 NOTE — Telephone Encounter (Signed)
 Pt called wanting to know where a creme for pain was called in for her last week when she was here for an appt. She stated that she will be by the phone waiting for a call back.  I don't see any med that is a crme according to her in her med list that was sent this month. She said she has gone to 3 different pharmacies and none of them had that medication ordered for her. Please advise.   [phone]

## 2024-03-10 NOTE — Telephone Encounter (Signed)
 Returned call to patient, advised no creams were sent by Dr. Gracie Lav and that what creams were listed were sent by other providers to [facility] on [address]. Patient appreciative of call

## 2024-03-10 NOTE — Telephone Encounter (Signed)
 Pt call stating that she is in a lot of pain . Pt states that she was giving shots in the head 3 to 4 weeks ago and it was helping her head, however now she can barely move  her neck . Pt states that she can barely sleep because the pain . This has been happening for a week.. . Pt head is throbbing  and she cannot take it . Pt is requesting to come to office today or soon .

## 2024-03-11 ENCOUNTER — Telehealth: Payer: Self-pay | Admitting: Family Medicine

## 2024-03-11 MED ORDER — HYDROCODONE-ACETAMINOPHEN 5-325 MG PO TABS: 1.0000 | ORAL_TABLET | Freq: Four times a day (QID) | ORAL | 0 refills | Status: DC | PRN

## 2024-03-11 NOTE — Telephone Encounter (Signed)
 Norco refilled

## 2024-03-17 ENCOUNTER — Ambulatory Visit (INDEPENDENT_AMBULATORY_CARE_PROVIDER_SITE_OTHER): Admitting: Family Medicine

## 2024-03-17 VITALS — BP 130/82 | HR 78 | Ht 61.0 in

## 2024-03-17 DIAGNOSIS — M545 Low back pain, unspecified: Secondary | ICD-10-CM

## 2024-03-17 DIAGNOSIS — M5481 Occipital neuralgia: Secondary | ICD-10-CM

## 2024-03-17 DIAGNOSIS — G8929 Other chronic pain: Secondary | ICD-10-CM | POA: Diagnosis not present

## 2024-03-17 DIAGNOSIS — Z9689 Presence of other specified functional implants: Secondary | ICD-10-CM

## 2024-03-17 NOTE — Patient Instructions (Signed)
 Thank you for coming in today.   I've referred you to Dr. Lorrine Kin.  Let us know if you don't hear from them in one week.

## 2024-03-17 NOTE — Progress Notes (Unsigned)
   Joanna Muck, PhD, LAT, ATC acting as a scribe for Garlan Juniper, MD.  Darlene Griffith is a 73 y.o. female who presents to [facility] today for neck pain and HA. Pt was previously seen by Dr. Alease Hunter on 02/23/24 for L foot and LBP.  Today, pt c/o neck pain and head pain ongoing since April 22nd. She was seen at the ED. Pain had improved and then worsened recently. She is wondering if this is related to her enlarged thyroid . Pt locates pain to R side of her neck and through R side of her head. She describes the pain, not as a HA, but a stabbing pain on the R-side of her whole head.  She also c/o her back pain is very severe.   Radiates: no UE Numbness/tingling: no UE Weakness:no  Dx imaging: 02/09/24 Head/neck CT angio 11/13/23 Head & c-spine CT  Pertinent review of systems: ***  Relevant historical information: ***   Exam:  There were no vitals taken for this visit. General: Well Developed, well nourished, and in no acute distress.   MSK: ***    Lab and Radiology Results No results found for this or any previous visit (from the past 72 hours). No results found.     Assessment and Plan: 73 y.o. female with ***   PDMP not reviewed this encounter. No orders of the defined types were placed in this encounter.  No orders of the defined types were placed in this encounter.    Discussed warning signs or symptoms. Please see discharge instructions. Patient expresses understanding.   ***

## 2024-03-21 NOTE — Telephone Encounter (Signed)
 Forwarding to [place] for pre-cert / prior auth.

## 2024-03-21 NOTE — Telephone Encounter (Signed)
 Sent message to Adonica Hoose through secure chat to have her re-run patient with new information gathered from 03/17/24 appointment.

## 2024-03-31 DIAGNOSIS — E042 Nontoxic multinodular goiter: Secondary | ICD-10-CM | POA: Diagnosis not present

## 2024-03-31 DIAGNOSIS — E0590 Thyrotoxicosis, unspecified without thyrotoxic crisis or storm: Secondary | ICD-10-CM | POA: Diagnosis not present

## 2024-03-31 DIAGNOSIS — E049 Nontoxic goiter, unspecified: Secondary | ICD-10-CM | POA: Diagnosis not present

## 2024-04-13 DIAGNOSIS — M47812 Spondylosis without myelopathy or radiculopathy, cervical region: Secondary | ICD-10-CM | POA: Diagnosis not present

## 2024-04-13 DIAGNOSIS — M5416 Radiculopathy, lumbar region: Secondary | ICD-10-CM | POA: Diagnosis not present

## 2024-04-21 ENCOUNTER — Ambulatory Visit: Admitting: Family Medicine

## 2024-04-21 DIAGNOSIS — Z0000 Encounter for general adult medical examination without abnormal findings: Secondary | ICD-10-CM

## 2024-04-21 NOTE — Progress Notes (Signed)
 PATIENT CHECK-IN and HEALTH RISK ASSESSMENT QUESTIONNAIRE:  -completed by phone/video for upcoming Medicare Preventive Visit  Pre-Visit Check-in: 1)Vitals (height, wt, BP, etc) - record in vitals section for visit on day of visit Request home vitals (wt, BP, etc.) and enter into vitals, THEN update Vital Signs SmartPhrase below at the top of the HPI. See below.  2)Review and Update Medications, Allergies PMH, Surgeries, Social history in Epic 3)Hospitalizations in the last year with date/reason? n  4)Review and Update Care Team (patient's specialists) in Epic 5) Complete PHQ9 in Epic  6) Complete Fall Screening in Epic 7)Review all Health Maintenance Due and order under PCP if not done.  Medicare Wellness Patient Questionnaire:  Answer theses question about your habits: How often do you have a drink containing alcohol?n How many drinks containing alcohol do you have on a typical day when you are drinking?y How often do you have six or more drinks on one occasion?na Have you ever smoked?y Quit date if applicable? 5 years ago  How many packs a day do/did you smoke? na Do you use smokeless tobacco?n Do you use an illicit drugs?n On average, how many days per week do you engage in moderate to strenuous exercise (like a brisk walk)? No - can't right now due to back and neck issues Typical diet: daughter is bringing her food/meals, drinks water   Answer theses question about your everyday activities: Can you perform most household chores?n, her daughter helps Are you deaf or have significant trouble hearing? n Do you feel that you have a problem with memory?some, but does not feel is much or out of the ordinary Do you feel safe at home?y Last dentist visit?dentures 8. Do you have any difficulty performing your everyday activities?y Are you having any difficulty walking, taking medications on your own, and or difficulty managing daily home needs? some Do you have difficulty walking or  climbing stairs?y Do you have difficulty dressing or bathing?n Do you have difficulty doing errands alone such as visiting a doctor's office or shopping?y, daughter helps Do you currently have any difficulty preparing food and eating?daughter prepares Do you currently have any difficulty using the toilet?n Do you have any difficulty managing your finances?daughter helps with this Do you have any difficulties with housekeeping of managing your housekeeping?y   Do you have Advanced Directives in place (Living Will, Healthcare Power or Attorney)? No, working on this   Last eye Exam and location? Reports will schedule an eye exam   Do you currently use prescribed or non-prescribed narcotic or opioid pain medications?occasionally   Do you have a history or close family history of breast, ovarian, tubal or peritoneal cancer or a family member with BRCA (breast cancer susceptibility 1 and 2) gene mutations? no    ----------------------------------------------------------------------------------------------------------------------------------------------------------------------------------------------------------------------  Because this visit was a virtual/telehealth visit, some criteria may be missing or patient reported. Any vitals not documented were not able to be obtained and vitals that have been documented are patient reported.    MEDICARE ANNUAL PREVENTIVE VISIT WITH PROVIDER: (Welcome to Medicare, initial annual wellness or annual wellness exam)  Virtual Visit via Phone Note  I connected with Darlene Griffith on 04/21/24 by phone and verified that I am speaking with the correct person using two identifiers. She prefers phone.  Location patient: home Location provider:work or home office Persons participating in the virtual visit: patient, provider  Concerns and/or follow up today: she is dealing with some neck issues - seeing Dr. Darlis and sports  medicine. Has shots planned  for later this month.    See HM section in Epic for other details of completed HM.    ROS: negative for report of fevers, unintentional weight loss, vision changes, vision loss, hearing loss or change, chest pain, sob out of the ordinary - she is out of shape, hemoptysis, melena, hematochezia, hematuria, falls, bleeding or bruising, thoughts of suicide or self harm, memory loss  Patient-completed extensive health risk assessment - reviewed and discussed with the patient: See Health Risk Assessment completed with patient prior to the visit either above or in recent phone note. This was reviewed in detailed with the patient today and appropriate recommendations, orders and referrals were placed as needed per Summary below and patient instructions.   Review of Medical History: -PMH, PSH, Family History and current specialty and care providers reviewed and updated and listed below   Patient Care Team: [place], Betty G, MD as PCP - General (Family Medicine)   Past Medical History:  Diagnosis Date   Chest pain    a. 01/2015 Echo: Nl LV fxn, Gr 1 DD, triv AI, mild MR.   Essential hypertension    Hepatic cyst    a. noted on CT 01/2015.   Hyperthyroidism    Multinodular goiter    a. 01/2015 CT chest: multinodular goidter w/ substernal extension of the left lobe of the thyroid  assoc w/ rightward deviation of tracheal air column.   Neck pain, chronic    Personal history of colonic polyps    Pulmonary nodules    a. 01/2015 CT Chest: RLL ~ 5mm subpleural nodule - rec f/u in 6-12 mos.   Splenic cyst    a. noted on CT 01/2015.    Past Surgical History:  Procedure Laterality Date   BYPASS GRAFT AORTA TO AORTA Left 11/11/2021   Procedure: BYPASS GRAFT AORTA TO LEFT COMMON  FEMORAL ARTERY;  Surgeon: Magda Debby SAILOR, MD;  Location: [facility] OR;  Service: Vascular;  Laterality: Left;   CERVICAL DISC ARTHROPLASTY N/A 07/05/2019   Procedure: Cervical Six-Seven Artificial disc replacement;  Surgeon: Colon Shove, MD;  Location: [facility] OR;  Service: Neurosurgery;  Laterality: N/A;  Cervical Six-Seven Artificial disc replacement   CESAREAN SECTION     COLONOSCOPY  01/13/2020   THORACIC AORTIC ENDOVASCULAR STENT GRAFT N/A 11/11/2021   Procedure: THORACIC AORTIC ENDOVASCULAR STENT GRAFT WITH LEFT BRACHIAL  ARTERY ACCESS;  Surgeon: Magda Debby SAILOR, MD;  Location: [facility] OR;  Service: Vascular;  Laterality: N/A;   TONSILLECTOMY     AROUND 5-6 YRS OLD   ULTRASOUND GUIDANCE FOR VASCULAR ACCESS Bilateral 11/11/2021   Procedure: ULTRASOUND GUIDANCE FOR VASCULAR ACCESS;  Surgeon: Magda Debby SAILOR, MD;  Location[facility] OR;  Service: Vascular;  Laterality: Bilateral;   UPPER GASTROINTESTINAL ENDOSCOPY  01/13/2020    Social History   Socioeconomic History   Marital status: Single    Spouse name: Not on file   Number of children: Not on file   Years of education: Not on file   Highest education level: Not on file  Occupational History   Occupation: retired  Tobacco Use   Smoking status: Light Smoker    Types: Cigarettes   Smokeless tobacco: Never   Tobacco comments:    1-2 NOT EVERY DAY   Vaping Use   Vaping status: Never Used  Substance and Sexual Activity   Alcohol use: Yes    Alcohol/week: 0.0 standard drinks of alcohol    Comment: rare   Drug use: No  Sexual activity: Not Currently  Other Topics Concern   Not on file  Social History Narrative   Lives alone.  Four children and eight grands.     Social Drivers of Corporate investment banker Strain: Low Risk  (12/19/2021)   Overall Financial Resource Strain (CARDIA)    Difficulty of Paying Living Expenses: Not hard at all  Food Insecurity: No Food Insecurity (11/20/2023)   Hunger Vital Sign    Worried About Running Out of Food in the Last Year: Never true    Ran Out of Food in the Last Year: Never true  Transportation Needs: No Transportation Needs (11/20/2023)   PRAPARE - Administrator, Civil Service (Medical): No    Lack of  Transportation (Non-Medical): No  Physical Activity: Inactive (03/12/2023)   Exercise Vital Sign    Days of Exercise per Week: 0 days    Minutes of Exercise per Session: 0 min  Stress: Stress Concern Present (03/12/2023)   [employer] of Occupational Health - Occupational Stress Questionnaire    Feeling of Stress : To some extent  Social Connections: Moderately Integrated (12/19/2021)   Social Connection and Isolation Panel    Frequency of Communication with Friends and Family: More than three times a week    Frequency of Social Gatherings with Friends and Family: More than three times a week    Attends Religious Services: More than 4 times per year    Active Member of [employer] or Organizations: Yes    Attends Banker Meetings: 1 to 4 times per year    Marital Status: Never married  Intimate Partner Violence: Not At Risk (12/19/2021)   Humiliation, Afraid, Rape, and Kick questionnaire    Fear of Current or Ex-Partner: No    Emotionally Abused: No    Physically Abused: No    Sexually Abused: No    Family History  Problem Relation Age of Onset   Heart attack Maternal Grandmother        deceased   High blood pressure Mother    High Cholesterol Mother    Thyroid  disease Mother    Thyroid  disease Sister    Lung cancer Father    Cancer Maternal Uncle        unk type   Cancer Sister        unk type   Colon cancer Neg Hx    Colon polyps Neg Hx    Esophageal cancer Neg Hx    Rectal cancer Neg Hx    Stomach cancer Neg Hx     Current Outpatient Medications on File Prior to Visit  Medication Sig Dispense Refill   amLODipine  (NORVASC ) 10 MG tablet Take 0.5 tablets (5 mg total) by mouth daily.     ascorbic acid  (VITAMIN C) 1000 MG tablet Take 1 tablet (1,000 mg total) by mouth daily. 30 tablet 0   atorvastatin  (LIPITOR) 40 MG tablet Take 1 tablet (40 mg total) by mouth daily. 90 tablet 2   butalbital -acetaminophen -caffeine  (FIORICET) 50-325-40 MG tablet Take 1 tablet by  mouth every 6 (six) hours as needed for headache. 12 tablet 5   carbamazepine  (TEGRETOL -XR) 100 MG 12 hr tablet Take 1 tablet (100 mg total) by mouth 2 (two) times daily. 30 tablet 0   carvedilol  (COREG ) 25 MG tablet Take 1 tablet (25 mg total) by mouth 2 (two) times daily with a meal. (Patient taking differently: Take 12.5 mg by mouth 2 (two) times daily with a meal.) 180 tablet  2   clotrimazole -betamethasone  (LOTRISONE ) cream Apply to affected area 2 times daily prn 15 g 0   COVID-19 mRNA vaccine, Pfizer, (COMIRNATY ) syringe Inject into the muscle. 0.3 mL 0   diclofenac  Sodium (VOLTAREN ) 1 % GEL Apply 4 g topically 4 (four) times daily. 100 g 0   DULoxetine  (CYMBALTA ) 30 MG capsule Take 1 capsule (30 mg total) by mouth at bedtime. 30 capsule 1   fluticasone  (FLONASE ) 50 MCG/ACT nasal spray Place 2 sprays into both nostrils at bedtime as needed for allergies or rhinitis. 16 g 1   HYDROcodone -acetaminophen  (NORCO/VICODIN) 5-325 MG tablet Take 1 tablet by mouth every 6 (six) hours as needed for severe pain (pain score 7-10). 8 tablet 0   ibuprofen  (ADVIL ) 800 MG tablet Take 1 tablet (800 mg total) by mouth 2 (two) times daily as needed. 6 tablet 0   Ipratropium-Albuterol  (COMBIVENT  RESPIMAT) 20-100 MCG/ACT AERS respimat Inhale 1 puff into the lungs in the morning, at noon, and at bedtime. 4 g 4   losartan  (COZAAR ) 25 MG tablet Take 1 tablet (25 mg total) by mouth daily. 90 tablet 1   methimazole  (TAPAZOLE ) 5 MG tablet Take 0.5 tablets (2.5 mg total) by mouth 3 (three) times daily. Keep appt with endocrinologist 11/2022 90 tablet 3   methocarbamol  (ROBAXIN ) 500 MG tablet Take 1 tablet (500 mg total) by mouth 2 (two) times daily as needed for muscle spasms. 20 tablet 0   mirabegron  ER (MYRBETRIQ ) 25 MG TB24 tablet Take 1 tablet (25 mg total) by mouth daily. 90 tablet 1   nitroGLYCERIN  (NITROSTAT ) 0.4 MG SL tablet Place 1 tablet (0.4 mg total) under the tongue every 5 (five) minutes as needed for chest  pain. 30 tablet 0   nystatin  (MYCOSTATIN /NYSTOP ) powder Apply 1 Application topically 3 (three) times daily. 60 g 3   pantoprazole  (PROTONIX ) 40 MG tablet Take 1 tablet (40 mg total) by mouth at bedtime. 90 tablet 2   polyethylene glycol (MIRALAX  / GLYCOLAX ) 17 g packet Take 17 g by mouth daily as needed for mild constipation. 14 each 0   pregabalin  (LYRICA ) 75 MG capsule Take 1 capsule (75 mg total) by mouth 2 (two) times daily as needed (nerve pain). 60 capsule 1   prochlorperazine  (COMPAZINE ) 5 MG tablet Take 1-2 tablets (5-10 mg total) by mouth every 6 (six) hours as needed for nausea. 30 tablet 0   senna (SENOKOT) 8.6 MG TABS tablet Take 2 tablets (17.2 mg total) by mouth at bedtime. 60 tablet 0   traZODone  (DESYREL ) 50 MG tablet Take 0.5-1 tablets (25-50 mg total) by mouth at bedtime as needed for sleep. 20 tablet 0   valACYclovir  (VALTREX ) 1000 MG tablet Take 1 tablet (1,000 mg total) by mouth 3 (three) times daily. 21 tablet 0   Current Facility-Administered Medications on File Prior to Visit  Medication Dose Route Frequency Provider Last Rate Last Admin   betamethasone  acetate-betamethasone  sodium phosphate  (CELESTONE ) injection 9 mg  9 mg Intramuscular Once Onita Duos, MD       bupivacaine  (MARCAINE ) 0.5 % (with pres) injection 1.5 mL  1.5 mL Infiltration Once Yan, Yijun, MD        Allergies  Allergen Reactions   Shellfish Allergy Anaphylaxis   Gabapentin      Other reaction(s): Confusion (intolerance)   Hm Lidocaine  Patch [Lidocaine ] Other (See Comments)    Lidocaine  patches caused burning   Tape Other (See Comments)    Pulls skin       Physical Exam Vitals  requested from patient and listed below if patient had equipment and was able to obtain at home for this virtual visit: There were no vitals filed for this visit. Estimated body mass index is 28.72 kg/m as calculated from the following:   Height as of 03/17/24: 5' 1 (1.549 m).   Weight as of 02/18/24: 152 lb (68.9  kg).  EKG (optional): deferred due to virtual visit  GENERAL: alert, oriented, no acute distress detected, full vision exam deferred due to pandemic and/or virtual encounter  PSYCH/NEURO: pleasant and cooperative, no obvious depression or anxiety, speech and thought processing grossly intact, Cognitive function grossly intact  Flowsheet Row Office Visit from 03/12/2023 in [facility]  PHQ-9 Total Score 15        04/21/2024   12:19 PM 03/12/2023    2:57 PM 12/19/2022   10:28 AM 09/26/2022    7:06 AM 12/19/2021    3:51 PM  Depression screen PHQ 2/9  Decreased Interest 0 3 1 3  0  Down, Depressed, Hopeless 0 0 0 1 0  PHQ - 2 Score 0 3 1 4  0  Altered sleeping  3 3 3  0  Tired, decreased energy  3 3 3  0  Change in appetite  3 3 3  0  Feeling bad or failure about yourself   0 0 0 0  Trouble concentrating  3 0 0 0  Moving slowly or fidgety/restless  0 0 1 0  Suicidal thoughts  0 0 0 0  PHQ-9 Score  15 10 14  0  Difficult doing work/chores  Extremely dIfficult Extremely dIfficult Very difficult Not difficult at all       08/25/2022    9:54 AM 09/26/2022    7:06 AM 12/19/2022   10:28 AM 03/12/2023    2:42 PM 04/21/2024   12:19 PM  Fall Risk  Falls in the past year?  1 0 1 0  Was there an injury with Fall?  0 0 0 0  Fall Risk Category Calculator  1 0 2 0  Fall Risk Category (Retired)  Low      (RETIRED) Patient Fall Risk Level Low fall risk  Low fall risk      Patient at Risk for Falls Due to  History of fall(s) Other (Comment) Other (Comment)   Fall risk Follow up  Falls evaluation completed  Falls evaluation completed Falls evaluation completed      Data saved with a previous flowsheet row definition     SUMMARY AND PLAN:  Encounter for Medicare annual wellness exam  Discussed applicable health maintenance/preventive health measures and advised and referred or ordered per patient preferences: -she agrees to call to schedule her colonoscopy after she is  done dealing with the back and neck issues -she thinks she had her shingles vaccine and agrees to get the records -discussed vaccines due, she plans to get the covid vaccine at the pharmacy -declined the bone density test for now, but agrees to call when ready to do Health Maintenance  Topic Date Due   Zoster Vaccines- Shingrix (1 of 2) Never done   DEXA SCAN  Never done   Colonoscopy  01/12/2021   COVID-19 Vaccine (2 - 2024-25 season) 01/28/2024   INFLUENZA VACCINE  05/20/2024   MAMMOGRAM  04/08/2025   Medicare Annual Wellness (AWV)  04/21/2025   Pneumococcal Vaccine: 50+ Years  Completed   Hepatitis C Screening  Completed   Hepatitis B Vaccines  Aged Out   HPV VACCINES  Aged Out   Meningococcal B Vaccine  Aged Out   DTaP/Tdap/Td  Discontinued      Education and counseling on the following was provided based on the above review of health and a plan/checklist for the patient, along with additional information discussed, was provided for the patient in the patient instructions :  -Advised on importance of completing advanced directives, discussed options for completing and provided information in patient instructions as well -Provided counseling and plan for difficulty hearing  -Advised and counseled on a healthy lifestyle - including the importance of a healthy diet, regular physical activity, social connections and stress management. -Reviewed patient's current diet. A summary of a healthy diet was provided in the Patient Instructions.  -reviewed patient's current physical activity level a - provided exercise guidelines for adults and resources and ideas for safe exercise at home to assist in meeting exercise guideline recommendations in a safe and healthy way - see patient instructions. She currently is seeing specialist for neck/back issues - advised she check in with them for any change or worsening from usually and discussed emergency options as well. She feels is like has been in  the past but reports will be calling her sports med doc.  -Advise yearly dental visits at minimum and regular eye exams  Follow up: see patient instructions     Patient Instructions  I really enjoyed getting to talk with you today! I am available on Tuesdays and Thursdays for virtual visits if you have any questions or concerns, or if I can be of any further assistance.   CHECKLIST FROM ANNUAL WELLNESS VISIT:  -Follow up (please call to schedule if not scheduled after visit):   -yearly for annual wellness visit with primary care office  Here is a list of your preventive care/health maintenance measures and the plan for each if any are due:  PLAN For any measures below that may be due:     1. Please call your gastroenterologist about your [phone]    2. Please call us  when you want us  to order your bone density test: [phone]   3. Can get vaccines at the pharmacy  Health Maintenance  Topic Date Due   Zoster Vaccines- Shingrix (1 of 2) Never done   DEXA SCAN  Never done   Colonoscopy  01/12/2021   COVID-19 Vaccine (2 - 2024-25 season) 01/28/2024   Medicare Annual Wellness (AWV)  03/11/2024   INFLUENZA VACCINE  05/20/2024   MAMMOGRAM  04/08/2025   Pneumococcal Vaccine: 50+ Years  Completed   Hepatitis C Screening  Completed   Hepatitis B Vaccines  Aged Out   HPV VACCINES  Aged Out   Meningococcal B Vaccine  Aged Out   DTaP/Tdap/Td  Discontinued    -See a dentist at least yearly  -Get your eyes checked and then per your eye specialist's recommendations  -Other issues addressed today:   1. Advanced directives - see below   -I have included below further information regarding a healthy whole foods based diet, physical activity guidelines for adults, stress management and opportunities for social connections. I hope you find this information useful.    -----------------------------------------------------------------------------------------------------------------------------------------------------------------------------------------------------------------------------------------------------------    NUTRITION: -eat real food: lots of colorful vegetables (half the plate) and fruits -5-7 servings of vegetables and fruits per day (fresh or steamed is best), exp. 2 servings of vegetables with lunch and dinner and 2 servings of fruit per day. Berries and greens such as kale and collards are great choices.  -consume on  a regular basis:  fresh fruits, fresh veggies, fish, nuts, seeds, healthy oils (such as olive oil, avocado oil), whole grains (make sure for bread/pasta/crackers/etc., that the first ingredient on label contains the word whole), legumes. -can eat small amounts of dairy and lean meat (no larger than the palm of your hand), but avoid processed meats such as ham, bacon, lunch meat, etc. -drink water -try to avoid fast food and pre-packaged foods, processed meat, ultra processed foods/beverages (donuts, candy, etc.) -most experts advise limiting sodium to < 2300mg  per day, should limit further is any chronic conditions such as high blood pressure, heart disease, diabetes, etc. The American Heart Association advised that < 1500mg  is is ideal -try to avoid foods/beverages that contain any ingredients with names you do not recognize  -try to avoid foods/beverages  with added sugar or sweeteners/sweets  -try to avoid sweet drinks (including diet drinks): soda, juice, Gatorade, sweet tea, power drinks, diet drinks -try to avoid white rice, white bread, pasta (unless whole grain)  EXERCISE GUIDELINES FOR ADULTS: -if you wish to increase your physical activity, do so gradually and with the approval of your doctor -STOP and seek medical care immediately if you have any chest pain, chest discomfort or trouble breathing when starting or  increasing exercise  -move and stretch your body, legs, feet and arms when sitting for long periods -Physical activity guidelines for optimal health in adults: -get at least 150 minutes per week of moderate exercise (can talk, but not sing); this is about 20-30 minutes of sustained activity 5-7 days per week or two 10-15 minute episodes of sustained activity 5-7 days per week -do some muscle building/resistance training/strength training at least 2 days per week  -balance exercises 3+ days per week:   Stand somewhere where you have something sturdy to hold onto if you lose balance    1) lift up on toes, then back down, start with 5x per day and work up to 20x   2) stand and lift one leg straight out to the side so that foot is a few inches of the floor, start with 5x each side and work up to 20x each side   3) stand on one foot, start with 5 seconds each side and work up to 20 seconds on each side  If you need ideas or help with getting more active:  -Silver sneakers https://tools.silversneakers.com  -Walk with a Doc: http://www.duncan-williams.com/  -try to include resistance (weight lifting/strength building) and balance exercises twice per week: or the following link for ideas: http://castillo-powell.com/  BuyDucts.dk  STRESS MANAGEMENT: -can try meditating, or just sitting quietly with deep breathing while intentionally relaxing all parts of your body for 5 minutes daily -if you need further help with stress, anxiety or depression please follow up with your primary doctor or contact the wonderful folks at [facility]: [phone]  SOCIAL CONNECTIONS: -options in [place] if you wish to engage in more social and exercise related activities:  -Silver sneakers https://tools.silversneakers.com  -Walk with a Doc: http://www.duncan-williams.com/  -Check out the [facility] Active Adults 50+  section on the [place] [employer] (hiking clubs, book clubs, cards and games, chess, exercise classes, aquatic classes and much more) - see the website for details: https://www.[place].gov/departments/parks-recreation/active-adults50  -YouTube has lots of exercise videos for different ages and abilities as well  -[facility] (a variety of indoor and outdoor inperson activities for adults). ([phone]) [phone]. [address].  -Virtual Online Classes (a variety of topics): see seniorplanet.org or  call [phone]  -consider volunteering at a school, hospice center, church, senior center or elsewhere            Chiquita JONELLE Cramp, DO

## 2024-04-21 NOTE — Patient Instructions (Addendum)
 I really enjoyed getting to talk with you today! I am available on Tuesdays and Thursdays for virtual visits if you have any questions or concerns, or if I can be of any further assistance.   CHECKLIST FROM ANNUAL WELLNESS VISIT:  -Follow up (please call to schedule if not scheduled after visit):   -yearly for annual wellness visit with primary care office  Here is a list of your preventive care/health maintenance measures and the plan for each if any are due:  PLAN For any measures below that may be due:     1. Please call your gastroenterologist about your [phone]    2. Please call us  when you want us  to order your bone density test: [phone]   3. Can get vaccines at the pharmacy  Health Maintenance  Topic Date Due   Zoster Vaccines- Shingrix (1 of 2) Never done   DEXA SCAN  Never done   Colonoscopy  01/12/2021   COVID-19 Vaccine (2 - 2024-25 season) 01/28/2024   Medicare Annual Wellness (AWV)  03/11/2024   INFLUENZA VACCINE  05/20/2024   MAMMOGRAM  04/08/2025   Pneumococcal Vaccine: 50+ Years  Completed   Hepatitis C Screening  Completed   Hepatitis B Vaccines  Aged Out   HPV VACCINES  Aged Out   Meningococcal B Vaccine  Aged Out   DTaP/Tdap/Td  Discontinued    -See a dentist at least yearly  -Get your eyes checked and then per your eye specialist's recommendations  -Other issues addressed today:   1. Advanced directives - see below   -I have included below further information regarding a healthy whole foods based diet, physical activity guidelines for adults, stress management and opportunities for social connections. I hope you find this information useful.   -----------------------------------------------------------------------------------------------------------------------------------------------------------------------------------------------------------------------------------------------------------    NUTRITION: -eat real food:  lots of colorful vegetables (half the plate) and fruits -5-7 servings of vegetables and fruits per day (fresh or steamed is best), exp. 2 servings of vegetables with lunch and dinner and 2 servings of fruit per day. Berries and greens such as kale and collards are great choices.  -consume on a regular basis:  fresh fruits, fresh veggies, fish, nuts, seeds, healthy oils (such as olive oil, avocado oil), whole grains (make sure for bread/pasta/crackers/etc., that the first ingredient on label contains the word whole), legumes. -can eat small amounts of dairy and lean meat (no larger than the palm of your hand), but avoid processed meats such as ham, bacon, lunch meat, etc. -drink water -try to avoid fast food and pre-packaged foods, processed meat, ultra processed foods/beverages (donuts, candy, etc.) -most experts advise limiting sodium to < 2300mg  per day, should limit further is any chronic conditions such as high blood pressure, heart disease, diabetes, etc. The American Heart Association advised that < 1500mg  is is ideal -try to avoid foods/beverages that contain any ingredients with names you do not recognize  -try to avoid foods/beverages  with added sugar or sweeteners/sweets  -try to avoid sweet drinks (including diet drinks): soda, juice, Gatorade, sweet tea, power drinks, diet drinks -try to avoid white rice, white bread, pasta (unless whole grain)  EXERCISE GUIDELINES FOR ADULTS: -if you wish to increase your physical activity, do so gradually and with the approval of your doctor -STOP and seek medical care immediately if you have any chest pain, chest discomfort or trouble breathing when starting or increasing exercise  -move and stretch your body, legs, feet and arms when sitting for long periods -Physical  activity guidelines for optimal health in adults: -get at least 150 minutes per week of moderate exercise (can talk, but not sing); this is about 20-30 minutes of sustained activity  5-7 days per week or two 10-15 minute episodes of sustained activity 5-7 days per week -do some muscle building/resistance training/strength training at least 2 days per week  -balance exercises 3+ days per week:   Stand somewhere where you have something sturdy to hold onto if you lose balance    1) lift up on toes, then back down, start with 5x per day and work up to 20x   2) stand and lift one leg straight out to the side so that foot is a few inches of the floor, start with 5x each side and work up to 20x each side   3) stand on one foot, start with 5 seconds each side and work up to 20 seconds on each side  If you need ideas or help with getting more active:  -Silver sneakers https://tools.silversneakers.com  -Walk with a Doc: http://www.duncan-williams.com/  -try to include resistance (weight lifting/strength building) and balance exercises twice per week: or the following link for ideas: http://castillo-powell.com/  BuyDucts.dk  STRESS MANAGEMENT: -can try meditating, or just sitting quietly with deep breathing while intentionally relaxing all parts of your body for 5 minutes daily -if you need further help with stress, anxiety or depression please follow up with your primary doctor or contact the wonderful folks at [facility]: [phone]  SOCIAL CONNECTIONS: -options in [place] if you wish to engage in more social and exercise related activities:  -Silver sneakers https://tools.silversneakers.com  -Walk with a Doc: http://www.duncan-williams.com/  -Check out the [facility] Active Adults 50+ section on the [place] of [employer] (hiking clubs, book clubs, cards and games, chess, exercise classes, aquatic classes and much more) - see the website for details: https://www.[place].gov/departments/parks-recreation/active-adults50  -YouTube has lots of exercise videos for  different ages and abilities as well  -[facility] (a variety of indoor and outdoor inperson activities for adults). [phone]. [address]  -Virtual Online Classes (a variety of topics): see seniorplanet.org or call [phone]  -consider volunteering at a school, hospice center, church, senior center or elsewhere

## 2024-04-25 ENCOUNTER — Encounter: Payer: Self-pay | Admitting: Family Medicine

## 2024-04-25 ENCOUNTER — Ambulatory Visit (INDEPENDENT_AMBULATORY_CARE_PROVIDER_SITE_OTHER): Admitting: Family Medicine

## 2024-04-25 VITALS — BP 112/76 | HR 82 | Ht 61.0 in

## 2024-04-25 DIAGNOSIS — G8929 Other chronic pain: Secondary | ICD-10-CM | POA: Diagnosis not present

## 2024-04-25 DIAGNOSIS — M542 Cervicalgia: Secondary | ICD-10-CM

## 2024-04-25 MED ORDER — HYDROCODONE-ACETAMINOPHEN 5-325 MG PO TABS: 1.0000 | ORAL_TABLET | Freq: Four times a day (QID) | ORAL | 0 refills | Status: DC | PRN

## 2024-04-25 NOTE — Patient Instructions (Addendum)
 Thank you for coming in today.   You received an injection today. Seek immediate medical attention if the joint becomes red, extremely painful, or is oozing fluid.   Keep your appointment with Dr. Darlis.   See you back as needed.

## 2024-04-25 NOTE — Progress Notes (Signed)
   I, Leotis Batter, CMA acting as a scribe for Artist Lloyd, MD.  Darlene Griffith is a 73 y.o. female who presents to [facility] at [facility] today for worsening occipital neuralgia. Pt was last seen by Dr. Lloyd on 03/17/24 and was referred to pain management.  Today, pt reports continued neck and head pain, worsening over the past week. Has been using neck support. Has not slept well over the past 4-5 nights. Locates pain the bilateral neck radiating into both sides of the back of the head. Has tried taking nerve pill, Tylenol , Goody's Powder, heat, ice. Had eval with Dr. Darlis but can't get back in until 05/12/24.   Dx imaging: 02/09/24 Head/neck CT angio 11/13/23 Head & c-spine CT  Pertinent review of systems: No fevers or chills  Relevant historical information: Hypertension.  Peripheral arterial disease.  Chronic back and neck pain.   Exam:  BP 112/76   Pulse 82   Ht 5' 1 (1.549 m)   SpO2 95%   BMI 28.72 kg/m  General: Well Developed, well nourished, and in no acute distress.   MSK: C-Spine: Normal appearing Nontender palpation midline. Tender palpation cervical paraspinal musculature.  Paraspinal musculature is rigid and tender to palpation. Cervical range of motion is very limited.    Lab and Radiology Results  Trigger point injection: Bilateral cervical paraspinal musculature. Consent obtained and timeout performed.  Risks reviewed. Skin sterilized with isopropyl alcohol and trigger point injections were performed bilateral cervical paraspinal muscles with a total of 80 mg of Kenalog  and 2 mL of lidocaine . 2 separate injections were used. Patient tolerated procedure well.   Assessment and Plan: 74 y.o. female with chronic neck pain with acute exacerbation due to muscle spasm and dysfunction.  Patient likely does have occipital neuralgia as well.  Plan for trigger point injections today.  Patient may be a good candidate for more targeted  occipital neuralgia nerve block with Dr. Darlis in the future.  For her chronic back pain she would be a good candidate potentially for a spine stimulator.  Further injections may be helpful as well.  Hydrocodone  refilled. Patient states her pain is severe today up to 10 out of 10.   PDMP reviewed during this encounter. No orders of the defined types were placed in this encounter.  Meds ordered this encounter  Medications   HYDROcodone -acetaminophen  (NORCO/VICODIN) 5-325 MG tablet    Sig: Take 1 tablet by mouth every 6 (six) hours as needed for severe pain (pain score 7-10).    Dispense:  20 tablet    Refill:  0     Discussed warning signs or symptoms. Please see discharge instructions. Patient expresses understanding.   The above documentation has been reviewed and is accurate and complete Artist Lloyd, M.D.

## 2024-04-26 ENCOUNTER — Telehealth: Payer: Self-pay | Admitting: Family Medicine

## 2024-04-26 DIAGNOSIS — M5416 Radiculopathy, lumbar region: Secondary | ICD-10-CM

## 2024-04-26 NOTE — Telephone Encounter (Signed)
 Patient called asking if the epidural order could be sent to [facility]?  Please advise.

## 2024-04-26 NOTE — Telephone Encounter (Signed)
 Per visit note 04/25/24:  Assessment and Plan: 73 y.o. female with chronic neck pain with acute exacerbation due to muscle spasm and dysfunction.  Patient likely does have occipital neuralgia as well.  Plan for trigger point injections today.  Patient may be a good candidate for more targeted occipital neuralgia nerve block with Dr. Darlis in the future.   For her chronic back pain she would be a good candidate potentially for a spine stimulator.  Further injections may be helpful as well.   Hydrocodone  refilled. Patient states her pain is severe today up to 10 out of 10.

## 2024-04-26 NOTE — Telephone Encounter (Signed)
 Order placed for lumbar ESI (back injection) at [facility].

## 2024-04-27 NOTE — Telephone Encounter (Signed)
Called pt, VM full, unable to leave a message.

## 2024-05-05 ENCOUNTER — Ambulatory Visit: Admitting: Neurology

## 2024-05-06 ENCOUNTER — Other Ambulatory Visit

## 2024-05-13 ENCOUNTER — Telehealth: Payer: Self-pay

## 2024-05-13 ENCOUNTER — Encounter: Payer: Self-pay | Admitting: Family Medicine

## 2024-05-13 NOTE — Telephone Encounter (Signed)
 Workplace accommodation form received.

## 2024-05-16 ENCOUNTER — Encounter: Payer: Self-pay | Admitting: Family Medicine

## 2024-05-16 ENCOUNTER — Other Ambulatory Visit: Payer: Self-pay | Admitting: Radiology

## 2024-05-16 NOTE — Discharge Instructions (Signed)

## 2024-05-17 ENCOUNTER — Inpatient Hospital Stay
Admission: RE | Admit: 2024-05-17 | Discharge: 2024-05-17 | Disposition: A | Discharge status: Home / Self Care | Source: Ambulatory Visit | Attending: Family Medicine | Admitting: Family Medicine

## 2024-05-17 NOTE — Telephone Encounter (Signed)
Form completed and placed on Dr. Corey's desk to review and sign.  

## 2024-05-17 NOTE — Telephone Encounter (Signed)
 Form reviewed and signed by Dr. Denyse Amass, placed at the front desk for faxing/scanning.

## 2024-05-19 ENCOUNTER — Ambulatory Visit
Admission: RE | Admit: 2024-05-19 | Discharge: 2024-05-19 | Disposition: A | Discharge status: Home / Self Care | Source: Ambulatory Visit | Attending: Family Medicine | Admitting: Family Medicine

## 2024-05-19 ENCOUNTER — Telehealth: Payer: Self-pay

## 2024-05-19 DIAGNOSIS — M5416 Radiculopathy, lumbar region: Secondary | ICD-10-CM

## 2024-05-19 DIAGNOSIS — M4727 Other spondylosis with radiculopathy, lumbosacral region: Secondary | ICD-10-CM | POA: Diagnosis not present

## 2024-05-19 DIAGNOSIS — M5116 Intervertebral disc disorders with radiculopathy, lumbar region: Secondary | ICD-10-CM | POA: Diagnosis not present

## 2024-05-19 MED ORDER — METHYLPREDNISOLONE ACETATE 40 MG/ML INJ SUSP (RADIOLOG
80.0000 mg | Freq: Once | INTRAMUSCULAR | Status: AC
  Administered 2024-05-19: 80 mg via EPIDURAL

## 2024-05-19 MED ORDER — IOPAMIDOL (ISOVUE-M 200) INJECTION 41%
1.0000 mL | Freq: Once | INTRAMUSCULAR | Status: AC
  Administered 2024-05-19: 1 mL via EPIDURAL

## 2024-05-19 NOTE — Discharge Instructions (Signed)

## 2024-06-18 DIAGNOSIS — R051 Acute cough: Secondary | ICD-10-CM | POA: Diagnosis not present

## 2024-06-18 DIAGNOSIS — J189 Pneumonia, unspecified organism: Secondary | ICD-10-CM | POA: Diagnosis not present

## 2024-06-18 DIAGNOSIS — J40 Bronchitis, not specified as acute or chronic: Secondary | ICD-10-CM | POA: Diagnosis not present

## 2024-06-18 DIAGNOSIS — R0602 Shortness of breath: Secondary | ICD-10-CM | POA: Diagnosis not present

## 2024-06-20 DIAGNOSIS — Z20822 Contact with and (suspected) exposure to covid-19: Secondary | ICD-10-CM | POA: Diagnosis not present

## 2024-06-20 DIAGNOSIS — R051 Acute cough: Secondary | ICD-10-CM | POA: Diagnosis not present

## 2024-07-07 NOTE — Progress Notes (Unsigned)
   LILLETTE Ileana Collet, PhD, LAT, ATC acting as a scribe for Artist Lloyd, MD.  Darlene Griffith is a 73 y.o. female who presents to [facility] at [facility] today to discuss rx and when she can have a repeat ESI. Pt was last seen by Dr. Lloyd on 04/25/24 and she was given trigger point injections in her bilateral cervical paraspinal musculature. Hydrocodone  was also refilled.  Today, pt reports she is in need of some pain rx. She recalls the hydrocodone  was helpful. Last ESI was helpful and lasted about 3-wks. She isn't sure if she had lifted something wrong or what, but pain abruptly returned. She is in need of a new rollator and scooter. She doesn't find the methocarbamol  helpful and is unsure if she is taking her Lyrica   Dx imaging: 02/09/24 Head/neck CT angio 11/13/23 Head & c-spine CT  Pertinent review of systems: No fevers or chills  Relevant historical information: Hypertension and COPD.   Exam:  BP 112/76   Pulse 82   Ht 5' 1 (1.549 m)   SpO2 95%   BMI 28.72 kg/m  General: Well Developed, well nourished, and in no acute distress.   MSK: Lumbar spine decreased lumbar motion.  Patient is shifted leaning to the left.        Assessment and Plan: 73 y.o. female with chronic back and leg pain multifactorial due to degenerative changes and lumbar radiculopathy.  Plan for repeat epidural steroid injection.  Refill hydrocodone .  Additionally patient will benefit from an electric scooter but a new rollator.  Her current rollator is absolutely completely worn out and missing basic components.  As for the electric scooter she has very limited mobility even with a rollator walker and cannot stand or move more than just a few minutes at a time.  Her mobility is significantly hampered.   PDMP not reviewed this encounter. Orders Placed This Encounter  Procedures   DG INJECT DIAG/THERA/INC NEEDLE/CATH/PLC EPI/LUMB/SAC W/IMG    Level and technique per radiology     Standing Status:   Future    Expiration Date:   08/07/2024    Reason for Exam (SYMPTOM  OR DIAGNOSIS REQUIRED):   Low back pain    Preferred Imaging Location?:   [facility]   Meds ordered this encounter  Medications   AMBULATORY NON FORMULARY MEDICATION    Sig: Art gallery manager Dispense 1 Dx code: F51.937, M54.50 Use as needed    Dispense:  1 Device    Refill:  0   AMBULATORY NON FORMULARY MEDICATION    Sig: Rollator Dispense 1 Dx code:M48.062, M54.50 Use as needed    Dispense:  1 Device    Refill:  0   HYDROcodone -acetaminophen  (NORCO/VICODIN) 5-325 MG tablet    Sig: Take 1 tablet by mouth every 6 (six) hours as needed for severe pain (pain score 7-10).    Dispense:  60 tablet    Refill:  0     Discussed warning signs or symptoms. Please see discharge instructions. Patient expresses understanding.   The above documentation has been reviewed and is accurate and complete Artist Lloyd, M.D.

## 2024-07-08 ENCOUNTER — Telehealth: Payer: Self-pay

## 2024-07-08 ENCOUNTER — Ambulatory Visit (INDEPENDENT_AMBULATORY_CARE_PROVIDER_SITE_OTHER): Admitting: Family Medicine

## 2024-07-08 VITALS — BP 112/76 | HR 82 | Ht 61.0 in

## 2024-07-08 DIAGNOSIS — M5416 Radiculopathy, lumbar region: Secondary | ICD-10-CM | POA: Diagnosis not present

## 2024-07-08 DIAGNOSIS — M48062 Spinal stenosis, lumbar region with neurogenic claudication: Secondary | ICD-10-CM | POA: Diagnosis not present

## 2024-07-08 DIAGNOSIS — R2689 Other abnormalities of gait and mobility: Secondary | ICD-10-CM

## 2024-07-08 MED ORDER — AMBULATORY NON FORMULARY MEDICATION: 0 refills | Status: DC

## 2024-07-08 MED ORDER — HYDROCODONE-ACETAMINOPHEN 5-325 MG PO TABS: 1.0000 | ORAL_TABLET | Freq: Four times a day (QID) | ORAL | 0 refills | Status: DC | PRN

## 2024-07-08 NOTE — Patient Instructions (Addendum)
 Thank you for coming in today.   Please call [facility] ([facility]) at [phone] to schedule your spine injection.    2 orders for a rollator and electric scooter

## 2024-07-08 NOTE — Telephone Encounter (Signed)
 Workplace accommodation form received.

## 2024-07-11 NOTE — Telephone Encounter (Signed)
 Form completed, placed on Dr. Zollie Pee desk to review and sign.

## 2024-07-13 NOTE — Telephone Encounter (Signed)
 Faxed, copy sent to scan and original placed in [facility] box.

## 2024-07-13 NOTE — Telephone Encounter (Signed)
 Form completed, reviewed and signed by Dr. Alease Hunter, placed at the front desk for faxing/scanning.

## 2024-07-15 ENCOUNTER — Ambulatory Visit: Payer: Self-pay

## 2024-07-15 ENCOUNTER — Telehealth: Payer: Self-pay | Admitting: Family Medicine

## 2024-07-15 ENCOUNTER — Encounter: Payer: Self-pay | Admitting: Gastroenterology

## 2024-07-15 NOTE — Telephone Encounter (Signed)
 Pt has last colonoscopy was at [facility] and patient was given phone number and transferred to [facility]

## 2024-07-15 NOTE — Telephone Encounter (Signed)
 Copied from CRM #[id number]. Topic: General - Other >> Jul 15, 2024 10:04 AM Thersia BROCKS wrote: Reason for CRM: Patient called in stated she would like to get an colonoscopy, would like a callback when she is able to be scheduled for that

## 2024-07-15 NOTE — Telephone Encounter (Signed)
 FYI Only or Action Required?: FYI only for provider.  Patient was last seen in primary care on 04/21/2024 by Luke Chiquita SAUNDERS, DO.  Called Nurse Triage reporting Urinary Frequency.  Symptoms began several months ago.  Interventions attempted: Nothing.  Symptoms are: gradually worsening.  Triage Disposition: See Physician Within 24 Hours  Patient/caregiver understands and will follow disposition?: Yes  **See note below**        Message from Mia F sent at 07/15/2024  9:57 AM EDT  Pt says she has worsening incontinence. She says it just comes and she has no control over it and it is very random. She has spoken to Dr [place] about it in the past but it is definitely getting worse. She does not mention any other symptoms as she tries to not look at what's coming out too much. She also mentions whatever she eats comes out of her within minutes. Please call [phone]   Reason for Disposition  [1] Can't control passage of urine (i.e., urinary incontinence) AND [2] new-onset (< 2 weeks) or worsening  Answer Assessment - Initial Assessment Questions 1. SYMPTOM: What's the main symptom you're concerned about? (e.g., frequency, incontinence)     Incontinent of bowel and bladder  2. ONSET: When did the   symptoms   start?     Ongoing for several months, getting progressively worse   3. PAIN: Is there any pain? If Yes, ask: How bad is it? (Scale: 1-10; mild, moderate, severe)     No  4. CAUSE: What do you think is causing the symptoms?     Unknown    5. OTHER SYMPTOMS: Do you have any other symptoms? (e.g., blood in urine, fever, flank pain, pain with urination)  Patient states PCP is aware of the urinary incontinence, not the bowel. She is concerned her body is not absorbing nutrients as she has a BM after each meal. (Not diarrhea) Patient stated she has an upcomming wellness exam on 10/3 and would like to discuss these concerns during that visit.  Protocols used:  Urinary Symptoms-A-AH

## 2024-07-18 NOTE — Telephone Encounter (Signed)
 Spoke with patient regarding symptoms. Patient states she has recently purchased Depend to help with incontinence. Patient has upcoming appointment with PCP on July 22, 2024. Offered to reschedule patient for sooner appointment. Per the patient, she is unable to come sooner because of work schedule. Patient states she will wait until scheduled appointment to discuss.

## 2024-07-22 ENCOUNTER — Ambulatory Visit (INDEPENDENT_AMBULATORY_CARE_PROVIDER_SITE_OTHER): Admitting: Family Medicine

## 2024-07-22 ENCOUNTER — Encounter: Payer: Self-pay | Admitting: Family Medicine

## 2024-07-22 VITALS — BP 134/80 | HR 100 | Temp 97.9°F | Resp 16 | Ht 61.0 in | Wt 141.4 lb

## 2024-07-22 DIAGNOSIS — G43709 Chronic migraine without aura, not intractable, without status migrainosus: Secondary | ICD-10-CM

## 2024-07-22 DIAGNOSIS — N3941 Urge incontinence: Secondary | ICD-10-CM

## 2024-07-22 DIAGNOSIS — E042 Nontoxic multinodular goiter: Secondary | ICD-10-CM | POA: Diagnosis not present

## 2024-07-22 DIAGNOSIS — J432 Centrilobular emphysema: Secondary | ICD-10-CM | POA: Diagnosis not present

## 2024-07-22 DIAGNOSIS — E7800 Pure hypercholesterolemia, unspecified: Secondary | ICD-10-CM | POA: Diagnosis not present

## 2024-07-22 DIAGNOSIS — K432 Incisional hernia without obstruction or gangrene: Secondary | ICD-10-CM

## 2024-07-22 DIAGNOSIS — Z5189 Encounter for other specified aftercare: Secondary | ICD-10-CM

## 2024-07-22 DIAGNOSIS — I7100 Dissection of unspecified site of aorta: Secondary | ICD-10-CM

## 2024-07-22 DIAGNOSIS — I10 Essential (primary) hypertension: Secondary | ICD-10-CM

## 2024-07-22 DIAGNOSIS — I350 Nonrheumatic aortic (valve) stenosis: Secondary | ICD-10-CM

## 2024-07-22 MED ORDER — AMLODIPINE BESYLATE 10 MG PO TABS: 5.0000 mg | ORAL_TABLET | Freq: Every day | ORAL | 2 refills | Status: AC

## 2024-07-22 NOTE — Assessment & Plan Note (Signed)
 She is currently on atorvastatin  40 mg daily and low-fat diet. She does not have time today to have blood work, will plan on doing so next visit.

## 2024-07-22 NOTE — Assessment & Plan Note (Signed)
 She reports problem is well-controlled, using Combivent  Respimat 20-100 mcg daily as needed, not frequently. Initially she denies tobacco use but then she says that she smokes occasionally when she is under stress.  Encouraged smoking cessation.

## 2024-07-22 NOTE — Assessment & Plan Note (Signed)
 Initially she requested referral to a different endocrinologist but currently she is following with general surgeon at [facility].  Thyroid  US  has been done, it seems like a neck CT is pending and she has not scheduled follow-up appointment, she is planning on doing so.

## 2024-07-22 NOTE — Progress Notes (Signed)
 Chief Complaint  Patient presents with   Follow-up    Discussed the use of AI scribe software for clinical note transcription with the patient, who gave verbal consent to proceed.  History of Present Illness Darlene Griffith is a 73 year old female a PMHx significant for HTN, aortic stenosis, lung nodule, COPD, GERD, hyperthyroidism, insomnia, HLD, and chronic pain who was here initially for CPE but she has several complaints today, so we decide to doe a follow up.  No new problems since her last visit. She has complaints in regard to hernia, incontinence, and bowel issues. She is seeing different provides for chronic co morbilities, she would like to transfer care to [facility].  -Incisional abdominal hernia: LLQ after vascular surgeries. States that her hernia is progressively enlarging, and she is concerned it may be affecting her bowel movements. She experiences frequent bowel movements within five to ten minutes after eating and concerned about this affecting absorption of nutrients. States that she doe sn ot check stool, not sure about blood or melena. She would like referral to surgeon at [facility]. She was evaluated for this problem, 10/17/22, when Dr Lyndel recommended to wait a few months due to risk of complications and  to try an abdominal binder, or lifting belt, to help with the symptoms. 3 months follow up was recommended.  She has not had a colonoscopy since March/26/2021, when she reports numerous polyps were removed.  -She has longstanding urinary incontinence, which has worsened over time. Previously, she would feel the urge, so she could run to the bathroom, but now she often experiences leakage without warning. She has tried medications, including Myrbetriq , without improvement. She would like to see urologist at [facility]. Hx of lower back pain radiated to LE's for years, no new symptoms. Negative for saddle anesthesia.  11/11/21: Acute Type B aortic dissection, s/p thoracic  branched endovascular aortic repair and stenting left common and external iliac artery complicated by iliac artery rupture requiring left common iliac to common femoral artery bypass. She is concerned that the procedure may have been complicated by the use of an oversized instrument. According to pt, she was told she still had a small tear in her heart, and she is concerned about what this means for her health. She wants to see a different vascular surgeon at [facility]. Post op follow up with cardiologist, 01/02/22. She had f/u CT scan on 11/28/21 showing patency of the bypass.  She believes all her aches and pains are related to surgical procedure. Last follow-up with vascular surgeon in 06/2022.  She is upset because she has been informed by [facility] that her thyroid  has grown into her chest, which she reports was not previously identified by her local providers. She is following with surgeon, initial consultation on 03/31/24, according to note, she was not interested in surgery. Hyperthyroidism, she follows with endocrinologist.  COPD: In general she reports her breathing as good She experiences significant back pain daily and sometimes has difficulty breathing due to pain.  She Combivent  respimat 20-100 mcg 1 puff tid  inhaler occasionally, which helps.  She occasionally smokes when stressed, though not on a regular basis.  HTN: She is on Amlodipine  10 mg 1/2 tab daily, Carvedilol  25 mg 1/2 tab bid, and Losartan  25 mg daily. She does not check her BP at home. Denies severe/frequent headache, visual changes, exertional chest pain, dyspnea, focal weakness, or edema.  She saw cardiologist on 10/10/2022 to follow-up on hyperlipidemia and hypertension. Lab Results  Component Value Date   NA 140 02/25/2024   CL 104 02/25/2024   K 3.7 02/25/2024   CO2 28 02/25/2024   BUN 9 02/25/2024   CREATININE 0.50 02/25/2024   GFR 93.31 02/25/2024   CALCIUM  9.2 02/25/2024   ALBUMIN  4.2 02/25/2024    GLUCOSE 90 02/25/2024   Lab Results  Component Value Date   ALT 11 02/25/2024   AST 14 02/25/2024   ALKPHOS 103 02/25/2024   BILITOT 0.3 02/25/2024   HLD: She is on Atorvastatin  40 mg daily.  Lab Results  Component Value Date   CHOL 241 (H) 01/22/2024   HDL 52.90 01/22/2024   LDLCALC 164 (H) 01/22/2024   TRIG 119.0 01/22/2024   CHOLHDL 5 01/22/2024   Lab Results  Component Value Date   HGBA1C 5.5 01/22/2024   She was last seen on 01/22/2024 for follow-up.  Since her last visit she has been following with sports medicine. She was seen at urgent care for acute cough on 06/20/2024, resolved.  Requesting referral to neurologist at [facility]. Evaluated by neurology on 02/18/2024 due to new onset of headaches, neck pain, and migraine headaches.  She was referred in 05/2022 due to paresthesia.  Review of Systems  Constitutional:  Positive for fatigue. Negative for activity change, appetite change, chills and fever.  Respiratory:  Negative for cough and wheezing.   Gastrointestinal:  Negative for abdominal pain, nausea and vomiting.  Endocrine: Negative for cold intolerance and heat intolerance.  Genitourinary:  Negative for decreased urine volume, dysuria and hematuria.  Musculoskeletal:  Positive for arthralgias and back pain.  Skin:  Negative for rash.  Neurological:  Negative for syncope, facial asymmetry and weakness.  Psychiatric/Behavioral:  Negative for confusion and hallucinations. The patient is nervous/anxious.    Current Outpatient Medications on File Prior to Visit  Medication Sig Dispense Refill   atorvastatin  (LIPITOR) 40 MG tablet Take 1 tablet (40 mg total) by mouth daily. 90 tablet 2   HYDROcodone -acetaminophen  (NORCO/VICODIN) 5-325 MG tablet Take 1 tablet by mouth every 6 (six) hours as needed for severe pain (pain score 7-10). 60 tablet 0   AMBULATORY NON FORMULARY MEDICATION Electric scooter Dispense 1 Dx code: M48.062, M54.50 Use as needed 1 Device 0   AMBULATORY  NON FORMULARY MEDICATION Rollator Dispense 1 Dx code:M48.062, M54.50 Use as needed 1 Device 0   ascorbic acid  (VITAMIN C) 1000 MG tablet Take 1 tablet (1,000 mg total) by mouth daily. 30 tablet 0   butalbital -acetaminophen -caffeine  (FIORICET) 50-325-40 MG tablet Take 1 tablet by mouth every 6 (six) hours as needed for headache. 12 tablet 5   carbamazepine  (TEGRETOL -XR) 100 MG 12 hr tablet Take 1 tablet (100 mg total) by mouth 2 (two) times daily. 30 tablet 0   carvedilol  (COREG ) 25 MG tablet Take 1 tablet (25 mg total) by mouth 2 (two) times daily with a meal. (Patient taking differently: Take 12.5 mg by mouth 2 (two) times daily with a meal.) 180 tablet 2   clotrimazole -betamethasone  (LOTRISONE ) cream Apply to affected area 2 times daily prn 15 g 0   COVID-19 mRNA vaccine, Pfizer, (COMIRNATY ) syringe Inject into the muscle. 0.3 mL 0   diclofenac  Sodium (VOLTAREN ) 1 % GEL Apply 4 g topically 4 (four) times daily. 100 g 0   DULoxetine  (CYMBALTA ) 30 MG capsule Take 1 capsule (30 mg total) by mouth at bedtime. 30 capsule 1   fluticasone  (FLONASE ) 50 MCG/ACT nasal spray Place 2 sprays into both nostrils at bedtime as needed for allergies or  rhinitis. 16 g 1   ibuprofen  (ADVIL ) 800 MG tablet Take 1 tablet (800 mg total) by mouth 2 (two) times daily as needed. 6 tablet 0   Ipratropium-Albuterol  (COMBIVENT  RESPIMAT) 20-100 MCG/ACT AERS respimat Inhale 1 puff into the lungs in the morning, at noon, and at bedtime. 4 g 4   losartan  (COZAAR ) 25 MG tablet Take 1 tablet (25 mg total) by mouth daily. 90 tablet 1   methimazole  (TAPAZOLE ) 5 MG tablet Take 0.5 tablets (2.5 mg total) by mouth 3 (three) times daily. Keep appt with endocrinologist 11/2022 90 tablet 3   methocarbamol  (ROBAXIN ) 500 MG tablet Take 1 tablet (500 mg total) by mouth 2 (two) times daily as needed for muscle spasms. 20 tablet 0   nitroGLYCERIN  (NITROSTAT ) 0.4 MG SL tablet Place 1 tablet (0.4 mg total) under the tongue every 5 (five) minutes  as needed for chest pain. 30 tablet 0   nystatin  (MYCOSTATIN /NYSTOP ) powder Apply 1 Application topically 3 (three) times daily. 60 g 3   pantoprazole  (PROTONIX ) 40 MG tablet Take 1 tablet (40 mg total) by mouth at bedtime. 90 tablet 2   polyethylene glycol (MIRALAX  / GLYCOLAX ) 17 g packet Take 17 g by mouth daily as needed for mild constipation. 14 each 0   pregabalin  (LYRICA ) 75 MG capsule Take 1 capsule (75 mg total) by mouth 2 (two) times daily as needed (nerve pain). 60 capsule 1   prochlorperazine  (COMPAZINE ) 5 MG tablet Take 1-2 tablets (5-10 mg total) by mouth every 6 (six) hours as needed for nausea. 30 tablet 0   senna (SENOKOT) 8.6 MG TABS tablet Take 2 tablets (17.2 mg total) by mouth at bedtime. 60 tablet 0   traZODone  (DESYREL ) 50 MG tablet Take 0.5-1 tablets (25-50 mg total) by mouth at bedtime as needed for sleep. 20 tablet 0   valACYclovir  (VALTREX ) 1000 MG tablet Take 1 tablet (1,000 mg total) by mouth 3 (three) times daily. 21 tablet 0   Current Facility-Administered Medications on File Prior to Visit  Medication Dose Route Frequency Provider Last Rate Last Admin   betamethasone  acetate-betamethasone  sodium phosphate  (CELESTONE ) injection 9 mg  9 mg Intramuscular Once Onita Duos, MD       bupivacaine  (MARCAINE ) 0.5 % (with pres) injection 1.5 mL  1.5 mL Infiltration Once Onita Duos, MD        Past Medical History:  Diagnosis Date   Chest pain    a. 01/2015 Echo: Nl LV fxn, Gr 1 DD, triv AI, mild MR.   Essential hypertension    Hepatic cyst    a. noted on CT 01/2015.   Hyperthyroidism    Multinodular goiter    a. 01/2015 CT chest: multinodular goidter w/ substernal extension of the left lobe of the thyroid  assoc w/ rightward deviation of tracheal air column.   Neck pain, chronic    Personal history of colonic polyps    Pulmonary nodules    a. 01/2015 CT Chest: RLL ~ 5mm subpleural nodule - rec f/u in 6-12 mos.   Splenic cyst    a. noted on CT 01/2015.    Past Surgical  History:  Procedure Laterality Date   BYPASS GRAFT AORTA TO AORTA Left 11/11/2021   Procedure: BYPASS GRAFT AORTA TO LEFT COMMON  FEMORAL ARTERY;  Surgeon: Magda Debby SAILOR, MD;  Location: [facility] OR;  Service: Vascular;  Laterality: Left;   CERVICAL DISC ARTHROPLASTY N/A 07/05/2019   Procedure: Cervical Six-Seven Artificial disc replacement;  Surgeon: Colon Shove, MD;  Location:  [facility] OR;  Service: Neurosurgery;  Laterality: N/A;  Cervical Six-Seven Artificial disc replacement   CESAREAN SECTION     COLONOSCOPY  01/13/2020   THORACIC AORTIC ENDOVASCULAR STENT GRAFT N/A 11/11/2021   Procedure: THORACIC AORTIC ENDOVASCULAR STENT GRAFT WITH LEFT BRACHIAL  ARTERY ACCESS;  Surgeon: Magda Debby SAILOR, MD;  Location: [facility] OR;  Service: Vascular;  Laterality: N/A;   TONSILLECTOMY     AROUND 5-6 YRS OLD   ULTRASOUND GUIDANCE FOR VASCULAR ACCESS Bilateral 11/11/2021   Procedure: ULTRASOUND GUIDANCE FOR VASCULAR ACCESS;  Surgeon: Magda Debby SAILOR, MD;  Location: [facility] OR;  Service: Vascular;  Laterality: Bilateral;   UPPER GASTROINTESTINAL ENDOSCOPY  01/13/2020    Allergies  Allergen Reactions   Shellfish Allergy Anaphylaxis   Gabapentin      Other reaction(s): Confusion (intolerance)   Hm Lidocaine  Patch [Lidocaine ] Other (See Comments)    Lidocaine  patches caused burning   Tape Other (See Comments)    Pulls skin    Family History  Problem Relation Age of Onset   Heart attack Maternal Grandmother        deceased   High blood pressure Mother    High Cholesterol Mother    Thyroid  disease Mother    Thyroid  disease Sister    Lung cancer Father    Cancer Maternal Uncle        unk type   Cancer Sister        unk type   Colon cancer Neg Hx    Colon polyps Neg Hx    Esophageal cancer Neg Hx    Rectal cancer Neg Hx    Stomach cancer Neg Hx     Social History   Socioeconomic History   Marital status: Single    Spouse name: Not on file   Number of children: Not on file   Years of education: Not  on file   Highest education level: Not on file  Occupational History   Occupation: retired  Tobacco Use   Smoking status: Light Smoker    Types: Cigarettes   Smokeless tobacco: Never   Tobacco comments:    1-2 NOT EVERY DAY   Vaping Use   Vaping status: Never Used  Substance and Sexual Activity   Alcohol use: Yes    Alcohol/week: 0.0 standard drinks of alcohol    Comment: rare   Drug use: No   Sexual activity: Not Currently  Other Topics Concern   Not on file  Social History Narrative   Lives alone.  Four children and eight grands.     Social Drivers of Corporate investment banker Strain: Low Risk  (12/19/2021)   Overall Financial Resource Strain (CARDIA)    Difficulty of Paying Living Expenses: Not hard at all  Food Insecurity: No Food Insecurity (11/20/2023)   Hunger Vital Sign    Worried About Running Out of Food in the Last Year: Never true    Ran Out of Food in the Last Year: Never true  Transportation Needs: No Transportation Needs (11/20/2023)   PRAPARE - Administrator, Civil Service (Medical): No    Lack of Transportation (Non-Medical): No  Physical Activity: Inactive (03/12/2023)   Exercise Vital Sign    Days of Exercise per Week: 0 days    Minutes of Exercise per Session: 0 min  Stress: Stress Concern Present (03/12/2023)   [employer] of Occupational Health - Occupational Stress Questionnaire    Feeling of Stress : To some extent  Social Connections: Moderately  Integrated (12/19/2021)   Social Connection and Isolation Panel    Frequency of Communication with Friends and Family: More than three times a week    Frequency of Social Gatherings with Friends and Family: More than three times a week    Attends Religious Services: More than 4 times per year    Active Member of [employer] or Organizations: Yes    Attends Banker Meetings: 1 to 4 times per year    Marital Status: Never married    Vitals:   07/22/24 1305  BP: 134/80  Pulse:  100  Resp: 16  Temp: 97.9 F (36.6 C)  SpO2: 96%   Body mass index is 26.72 kg/m.  Wt Readings from Last 3 Encounters:  07/22/24 141 lb 6.4 oz (64.1 kg)  02/18/24 152 lb (68.9 kg)  01/22/24 154 lb 6 oz (70 kg)   Physical Exam Vitals and nursing note reviewed.  Constitutional:      General: She is not in acute distress.    Appearance: She is well-developed.  HENT:     Head: Normocephalic and atraumatic.  Eyes:     Conjunctiva/sclera: Conjunctivae normal.  Cardiovascular:     Rate and Rhythm: Normal rate and regular rhythm.     Heart sounds: Murmur (SEM I-II/VI RUSB and LUSB) heard.     Comments: DP pulses palpable. Pulmonary:     Effort: Pulmonary effort is normal. No respiratory distress.     Breath sounds: Normal breath sounds.  Abdominal:     Palpations: Abdomen is soft. There is no mass.     Tenderness: There is no abdominal tenderness.     Hernia: A hernia (Left-sided incisional hernia, not able to reduce completely due to size.) is present.  Musculoskeletal:     Right lower leg: No edema.     Left lower leg: No edema.  Lymphadenopathy:     Cervical: No cervical adenopathy.  Skin:    General: Skin is warm.     Findings: No erythema or rash.  Neurological:     General: No focal deficit present.     Mental Status: She is alert and oriented to person, place, and time.     Comments: Antalgic gait, not assisted.  Psychiatric:        Mood and Affect: Affect normal. Mood is anxious.        Speech: Speech is rapid and pressured.   ASSESSMENT AND PLAN: Ms. NALIYAH NETH was here today annual physical examination.  Orders Placed This Encounter  Procedures   Ambulatory referral to Vascular Surgery   Ambulatory referral to Urology   Ambulatory referral to General Surgery   Ambulatory referral to Cardiology   Ambulatory referral to Neurology   Pure hypercholesterolemia Assessment & Plan: She is currently on atorvastatin  40 mg daily and low-fat diet. She does  not have time today to have blood work, will plan on doing so next visit.   Centrilobular emphysema (HCC) Assessment & Plan: She reports problem is well-controlled, using Combivent  Respimat 20-100 mcg daily as needed, not frequently. Initially she denies tobacco use but then she says that she smokes occasionally when she is under stress.  Encouraged smoking cessation.   Urge incontinence of urine Assessment & Plan: Problem is getting worse. Myrbetriq  did not help. Referral to urologist at [facility] placed.  Orders: -     Ambulatory referral to Urology  Multinodular goiter Assessment & Plan: Initially she requested referral to a different endocrinologist but currently she is  following with general surgeon at [facility].  Thyroid  US  has been done, it seems like a neck CT is pending and she has not scheduled follow-up appointment, she is planning on doing so.   Essential hypertension Assessment & Plan: BP otherwise adequate control. Continue Amlodipine  10 mg 1/2 tab daily, Carvedilol  25 mg 1/2 tab bid,and Losartan  25 mg daily as well as low salt diet. Monitor BP at home.  Orders: -     Ambulatory referral to Cardiology -     amLODIPine  Besylate; Take 0.5 tablets (5 mg total) by mouth daily.  Dispense: 45 tablet; Refill: 2  Dissection of aorta, unspecified portion of aorta [facility]) Assessment & Plan: 11/11/21: Acute Type B aortic dissection, s/p thoracic branched endovascular aortic repair and stenting left common and external iliac artery complicated by iliac artery rupture requiring left common iliac to common femoral artery bypass. She has a lot of complaints about procedure and believes she has pains and aches from it (hx of chronic pain), she is requesting referral to a different vascular surgeon.  Orders: -     Ambulatory referral to Vascular Surgery  Incisional hernia, without obstruction or gangrene Assessment & Plan: She feels like hernia is getting bigger, she has some  discomfort around area but no abdominal pain. Reporting some changes in bowel habits, more frequent and after eating. She is requesting referral to surgeon at [facility].  Orders: -     Ambulatory referral to General Surgery  Chronic migraine w/o aura w/o status migrainosus, not intractable Assessment & Plan: She has seen neurologist for this problem. No new associated symptoms. She wasn't to transfer care to [facility].  Orders: -     Ambulatory referral to Neurology  Follow-up medical care requested by patient -     Ambulatory referral to Vascular Surgery -     Ambulatory referral to Urology -     Ambulatory referral to General Surgery -     Ambulatory referral to Cardiology    I personally spent a total of 42 minutes in the care of the patient today including preparing to see the patient, getting/reviewing separately obtained history, performing a medically appropriate exam/evaluation, counseling and educating, placing orders, referring and communicating with other health care professionals, and documenting clinical information in the EHR. She is overdue for colonoscopy, apparently a letter was sent on 07/15/24 to her address with phone number for her to call and schedule appt, she is aware, instructed to call and schedule appt. She has transportation here to pick her up and did not have time for blood work.  Return in about 2 months (around 09/21/2024) for CPE.  Lulabelle Desta G. [place], MD  [facility]. [place] office.

## 2024-07-23 ENCOUNTER — Encounter: Payer: Self-pay | Admitting: Family Medicine

## 2024-07-23 NOTE — Assessment & Plan Note (Signed)
 She has seen neurologist for this problem. No new associated symptoms. She wasn't to transfer care to [facility].

## 2024-07-23 NOTE — Assessment & Plan Note (Signed)
 Problem is getting worse. Myrbetriq  did not help. Referral to urologist at [facility] placed.

## 2024-07-23 NOTE — Assessment & Plan Note (Signed)
 11/11/21: Acute Type B aortic dissection, s/p thoracic branched endovascular aortic repair and stenting left common and external iliac artery complicated by iliac artery rupture requiring left common iliac to common femoral artery bypass. She has a lot of complaints about procedure and believes she has pains and aches from it (hx of chronic pain), she is requesting referral to a different vascular surgeon.

## 2024-07-23 NOTE — Assessment & Plan Note (Signed)
 She feels like hernia is getting bigger, she has some discomfort around area but no abdominal pain. Reporting some changes in bowel habits, more frequent and after eating. She is requesting referral to surgeon at [facility].

## 2024-07-23 NOTE — Assessment & Plan Note (Signed)
 BP otherwise adequate control. Continue Amlodipine  10 mg 1/2 tab daily, Carvedilol  25 mg 1/2 tab bid,and Losartan  25 mg daily as well as low salt diet. Monitor BP at home.

## 2024-07-28 ENCOUNTER — Telehealth: Payer: Self-pay | Admitting: Family Medicine

## 2024-07-28 NOTE — Telephone Encounter (Signed)
 Forwarding to Dr. Denyse Amass to review and advise.

## 2024-07-28 NOTE — Telephone Encounter (Signed)
 Patient called and woke up this morning and swelling is worse and not better in the left foot at the ankle. She states she can not put her foot down. She is going to come drop paperwork off today and asked if Dr. Joane could possibly see her today. Pain is shooting up her leg. Please advise.

## 2024-07-29 ENCOUNTER — Emergency Department (HOSPITAL_BASED_OUTPATIENT_CLINIC_OR_DEPARTMENT_OTHER): Admitting: Radiology

## 2024-07-29 ENCOUNTER — Other Ambulatory Visit

## 2024-07-29 ENCOUNTER — Emergency Department (HOSPITAL_BASED_OUTPATIENT_CLINIC_OR_DEPARTMENT_OTHER)
Admission: EM | Admit: 2024-07-29 | Discharge: 2024-07-29 | Disposition: A | Discharge status: Home / Self Care | Attending: Emergency Medicine | Admitting: Emergency Medicine

## 2024-07-29 ENCOUNTER — Other Ambulatory Visit: Payer: Self-pay

## 2024-07-29 DIAGNOSIS — Z79899 Other long term (current) drug therapy: Secondary | ICD-10-CM | POA: Insufficient documentation

## 2024-07-29 DIAGNOSIS — M25572 Pain in left ankle and joints of left foot: Secondary | ICD-10-CM | POA: Insufficient documentation

## 2024-07-29 DIAGNOSIS — M19072 Primary osteoarthritis, left ankle and foot: Secondary | ICD-10-CM | POA: Diagnosis not present

## 2024-07-29 DIAGNOSIS — E039 Hypothyroidism, unspecified: Secondary | ICD-10-CM | POA: Diagnosis not present

## 2024-07-29 DIAGNOSIS — I10 Essential (primary) hypertension: Secondary | ICD-10-CM | POA: Diagnosis not present

## 2024-07-29 DIAGNOSIS — M79672 Pain in left foot: Secondary | ICD-10-CM | POA: Diagnosis not present

## 2024-07-29 DIAGNOSIS — M7989 Other specified soft tissue disorders: Secondary | ICD-10-CM | POA: Diagnosis not present

## 2024-07-29 MED ORDER — PREDNISONE 20 MG PO TABS: 40.0000 mg | ORAL_TABLET | Freq: Every day | ORAL | 0 refills | Status: AC

## 2024-07-29 MED ORDER — PREDNISONE 20 MG PO TABS
40.0000 mg | ORAL_TABLET | Freq: Once | ORAL | Status: AC
Start: 2024-07-29 — End: 2024-07-29
  Administered 2024-07-29: 40 mg via ORAL
  Filled 2024-07-29: qty 2

## 2024-07-29 MED ORDER — HYDROCODONE-ACETAMINOPHEN 5-325 MG PO TABS: 1.0000 | ORAL_TABLET | Freq: Four times a day (QID) | ORAL | 0 refills | Status: DC | PRN

## 2024-07-29 MED ORDER — HYDROCODONE-ACETAMINOPHEN 5-325 MG PO TABS
1.0000 | ORAL_TABLET | Freq: Once | ORAL | Status: AC
  Administered 2024-07-29: 1 via ORAL
  Filled 2024-07-29: qty 1

## 2024-07-29 MED ORDER — IBUPROFEN 400 MG PO TABS
600.0000 mg | ORAL_TABLET | Freq: Once | ORAL | Status: AC
  Administered 2024-07-29: 600 mg via ORAL
  Filled 2024-07-29: qty 1

## 2024-07-29 NOTE — Discharge Instructions (Addendum)
 The x-ray today look normal and there is no sign of there being any blood clot.  You were given prescription for some pain medication and a steroid.  Elevate your foot when you can to help with the swelling.

## 2024-07-29 NOTE — Telephone Encounter (Signed)
 Form revision requested - needs to include time frame of up to 6 months and to allow multiple/consecutive days per episode.   Form updated and placed at the front desk for faxing / scanning.   DUE 08/01/2024

## 2024-07-29 NOTE — ED Provider Notes (Signed)
 [place] [facility] DEPARTMENT AT [facility] Provider Note   CSN: [id number] Arrival date & time: 07/29/24  2111     Patient presents with: Foot Pain   Darlene Griffith is a 73 y.o. female.   Patient is a 73 year old female with a history of hypertension, chronic neck and back pain, hypothyroidism who is presenting today with complaints of left ankle pain.  She reports this started yesterday when she woke up and there was pain in the left ankle when she tried to get out of bed and put her foot on the ground.  It hurt her all day long and today it has been worse.  It has been swollen but she denies any new shoes or trauma.  She did not have excessive walking or anything that she can think of that would have made it worse.  Everything started in her ankle but now it is radiating up into her leg.  No prior history of gout or similar symptoms.  The history is provided by the patient.  Foot Pain       Prior to Admission medications   Medication Sig Start Date End Date Taking? Authorizing Provider  HYDROcodone -acetaminophen  (NORCO/VICODIN) 5-325 MG tablet Take 1 tablet by mouth every 6 (six) hours as needed. 07/29/24  Yes Arneta Mahmood, Benton, MD  predniSONE  (DELTASONE ) 20 MG tablet Take 2 tablets (40 mg total) by mouth daily. 07/29/24  Yes Doretha Benton, MD  AMBULATORY NON FORMULARY MEDICATION Electric scooter Dispense 1 Dx code: [phone], M54.50 Use as needed 07/08/24   Joane Artist RAMAN, MD  AMBULATORY NON FORMULARY MEDICATION Rollator Dispense 1 Dx code:M48.062, M54.50 Use as needed 07/08/24   Corey, Evan S, MD  amLODipine  (NORVASC ) 10 MG tablet Take 0.5 tablets (5 mg total) by mouth daily. 07/22/24   [place], Betty G, MD  ascorbic acid  (VITAMIN C) 1000 MG tablet Take 1 tablet (1,000 mg total) by mouth daily. 03/18/22   [place], Betty G, MD  atorvastatin  (LIPITOR) 40 MG tablet Take 1 tablet (40 mg total) by mouth daily. 01/25/24   [place], Betty G, MD   butalbital -acetaminophen -caffeine  (FIORICET) 50-325-40 MG tablet Take 1 tablet by mouth every 6 (six) hours as needed for headache. 02/18/24   Onita Duos, MD  carbamazepine  (TEGRETOL -XR) 100 MG 12 hr tablet Take 1 tablet (100 mg total) by mouth 2 (two) times daily. 02/09/24   Geiple, Joshua, PA-C  carvedilol  (COREG ) 25 MG tablet Take 1 tablet (25 mg total) by mouth 2 (two) times daily with a meal. Patient taking differently: Take 12.5 mg by mouth 2 (two) times daily with a meal. 12/14/23   [place], Betty G, MD  clotrimazole -betamethasone  (LOTRISONE ) cream Apply to affected area 2 times daily prn 04/24/20   Maranda Jamee Jacob, MD  COVID-19 mRNA vaccine, Pfizer, (COMIRNATY ) syringe Inject into the muscle. 07/30/23   Luiz Channel, MD  diclofenac  Sodium (VOLTAREN ) 1 % GEL Apply 4 g topically 4 (four) times daily. 04/28/21   Emil Share, DO  DULoxetine  (CYMBALTA ) 30 MG capsule Take 1 capsule (30 mg total) by mouth at bedtime. 01/02/22   Raulkar, Sven SQUIBB, MD  fluticasone  (FLONASE ) 50 MCG/ACT nasal spray Place 2 sprays into both nostrils at bedtime as needed for allergies or rhinitis. 12/19/22   [place], Betty G, MD  ibuprofen  (ADVIL ) 800 MG tablet Take 1 tablet (800 mg total) by mouth 2 (two) times daily as needed. 05/02/23   Raspet, Erin K, PA-C  Ipratropium-Albuterol  (COMBIVENT  RESPIMAT) 20-100 MCG/ACT AERS respimat Inhale 1 puff into  the lungs in the morning, at noon, and at bedtime. 12/19/22   [place], Betty G, MD  losartan  (COZAAR ) 25 MG tablet Take 1 tablet (25 mg total) by mouth daily. 03/18/22   [place], Betty G, MD  methimazole  (TAPAZOLE ) 5 MG tablet Take 0.5 tablets (2.5 mg total) by mouth 3 (three) times daily. Keep appt with endocrinologist 11/2022 09/30/22   [place], Betty G, MD  methocarbamol  (ROBAXIN ) 500 MG tablet Take 1 tablet (500 mg total) by mouth 2 (two) times daily as needed for muscle spasms. 11/13/23   Davis, Jonathon H, MD  nitroGLYCERIN  (NITROSTAT ) 0.4 MG SL tablet Place 1 tablet (0.4 mg total)  under the tongue every 5 (five) minutes as needed for chest pain. 01/04/23   Curatolo, Adam, DO  nystatin  (MYCOSTATIN /NYSTOP ) powder Apply 1 Application topically 3 (three) times daily. 12/04/23   [place], Betty G, MD  pantoprazole  (PROTONIX ) 40 MG tablet Take 1 tablet (40 mg total) by mouth at bedtime. 03/18/22   [place], Betty G, MD  polyethylene glycol (MIRALAX  / GLYCOLAX ) 17 g packet Take 17 g by mouth daily as needed for mild constipation. 12/03/21   Setzer, Sandra J, PA-C  pregabalin  (LYRICA ) 75 MG capsule Take 1 capsule (75 mg total) by mouth 2 (two) times daily as needed (nerve pain). 12/03/23   Corey, Evan S, MD  prochlorperazine  (COMPAZINE ) 5 MG tablet Take 1-2 tablets (5-10 mg total) by mouth every 6 (six) hours as needed for nausea. 12/03/21   Setzer, Sandra J, PA-C  senna (SENOKOT) 8.6 MG TABS tablet Take 2 tablets (17.2 mg total) by mouth at bedtime. 12/03/21   Setzer, Sandra J, PA-C  traZODone  (DESYREL ) 50 MG tablet Take 0.5-1 tablets (25-50 mg total) by mouth at bedtime as needed for sleep. 12/25/21   Joane Artist RAMAN, MD  valACYclovir  (VALTREX ) 1000 MG tablet Take 1 tablet (1,000 mg total) by mouth 3 (three) times daily. 06/29/23   Joane Artist RAMAN, MD    Allergies: Shellfish allergy, Gabapentin , Hm lidocaine  patch [lidocaine ], and Tape    Review of Systems  Updated Vital Signs BP 139/71 (BP Location: Left Arm)   Pulse 94   Temp 98 F (36.7 C) (Oral)   Resp 15   Ht 5' (1.524 m)   Wt 64.4 kg   SpO2 95%   BMI 27.73 kg/m   Physical Exam Vitals reviewed.  Cardiovascular:     Rate and Rhythm: Normal rate.  Pulmonary:     Effort: Pulmonary effort is normal.  Musculoskeletal:     Left ankle: Swelling present. Tenderness present over the lateral malleolus.     Comments: Significant swelling in the lateral malleolus or area with some minimal warmth but no erythema.  Patient's range of motion is still intact.  Foot is warm with a normal pulse on the left.  No left calf pain or swelling   Neurological:     Mental Status: She is alert.     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 No acute fracture or dislocation is seen. Degenerative changes of first MTP joint are noted. Mild flattening of the plantar arch is noted. No acute fracture is seen. IMPRESSION: Chronic changes without  acute abnormality. Electronically Signed   By: Oneil Devonshire M.D.   On: 07/29/2024 22:38   DG Ankle Complete Left Result Date: 07/29/2024 CLINICAL DATA:  Ankle pain for 2 days, no known injury, initial encounter EXAM: LEFT ANKLE COMPLETE - 3+ VIEW COMPARISON:  02/23/2024 FINDINGS: Soft tissue swelling is noted laterally. No acute fracture or dislocation is seen. Mild flattening of the plantar arch is noted. IMPRESSION: Lateral soft tissue swelling without acute bony abnormality. Electronically Signed   By: Oneil Devonshire M.D.   On: 07/29/2024 22:37     Procedures   Medications Ordered in the ED  HYDROcodone -acetaminophen  (NORCO/VICODIN) 5-325 MG per tablet 1 tablet (1 tablet Oral Given 07/29/24 2156)  ibuprofen  (ADVIL ) tablet 600 mg (600 mg Oral Given 07/29/24 2156)  predniSONE  (DELTASONE ) tablet 40 mg (40 mg Oral Given 07/29/24 2259)                                    Medical Decision Making Amount and/or Complexity of Data Reviewed Radiology: ordered and independent interpretation performed. Decision-making details documented in ED Course.  Risk Prescription drug management.   Patient presenting today with atraumatic swelling and pain in the left ankle.  Now it is also radiating down into her heel.  Concern for joint inflammation, bone lesion, no  history consistent with trauma.  Patient's findings are not classic for septic joint.  Patient given pain control and imaging is pending.  11:07 PM I have independently visualized and interpreted pt's images today. X-ray without any bony abnormalities and soft tissue swelling.  Will treat with steroid and pain control.  Patient has an orthopedist she can follow-up with.     Final diagnoses:  Acute left ankle pain    ED Discharge Orders          Ordered    HYDROcodone -acetaminophen  (NORCO/VICODIN) 5-325 MG tablet  Every 6 hours PRN        07/29/24 2259    predniSONE  (DELTASONE ) 20 MG tablet  Daily        07/29/24 2259               Doretha Folks, MD 07/29/24 2307

## 2024-07-29 NOTE — ED Triage Notes (Signed)
 Pt POV reporting L foot/ankle pain since yesterday, denies trauma/injury.

## 2024-08-01 NOTE — Telephone Encounter (Signed)
 Per patient, the accommodation needs to allow up to 50 minute breaks throughout the day.  And the certificate of healthcare is the FMLA that needs to addition of multiple days.   Per the [facility], the forms that were received were not the right one but I confirmed the fax number was correct. Re-sent that form.  Can we check on the FMLA?  Jonita also said that she would try to send us  the FMLA form as well to confirm we have it. This may come to your email, Mliss.

## 2024-08-02 NOTE — Telephone Encounter (Signed)
Faxed and sent to scan.

## 2024-08-02 NOTE — Telephone Encounter (Signed)
 Intermittent FMLA form completed, reviewed and signed by Dr. Joane. Placed at the front desk for faxing/scanning.

## 2024-08-02 NOTE — Telephone Encounter (Signed)
 Patient returned call, will plan for: Up to 10 absences per month  Lasting 1-4 days per episode

## 2024-08-02 NOTE — Telephone Encounter (Signed)
 Called pt, VM not set up.   Called daughter Erie, left VM to have Mora call the office.   Needs clarification for FMLA form  - intermittent leave.

## 2024-08-19 ENCOUNTER — Other Ambulatory Visit: Payer: Self-pay

## 2024-08-19 DIAGNOSIS — M5416 Radiculopathy, lumbar region: Secondary | ICD-10-CM

## 2024-08-25 ENCOUNTER — Telehealth: Payer: Self-pay | Admitting: Family Medicine

## 2024-08-25 DIAGNOSIS — M79661 Pain in right lower leg: Secondary | ICD-10-CM

## 2024-08-25 NOTE — Telephone Encounter (Signed)
 Patient called stating that she has been having leg pain that is worsening and she said that it seems to be moving up her leg. She requested to be seen by Dr Joane tomorrow but we do not have availability until 11/17. Can someone call her to discuss? She was worried about the fact that the pain is moving.

## 2024-08-25 NOTE — Addendum Note (Signed)
 Addended by: GEROME ILEANA RAMAN on: 08/25/2024 02:42 PM   Modules accepted: Orders

## 2024-08-25 NOTE — Telephone Encounter (Signed)
 Called and spoke with patient. Notes that last Saturday morning she woke up and felt like there was a painful knot between the ankle and the knee. Took pain meds but was still unable to sleep. Pt has been in severe pain all week. Sx started in the leg but now is in the thigh. Notes constant throbbing, felt like the bone was twisted. She is concerned because it seems that the pain is moving up, very concerned about a possible blood clot. Denies increased warmth, swelling or erythema. The leg was swollen earlier this week but it has gone down some.

## 2024-08-26 ENCOUNTER — Telehealth (HOSPITAL_COMMUNITY): Payer: Self-pay

## 2024-08-26 NOTE — Telephone Encounter (Signed)
 Attempted to contact the patient to schedule VAS US .  No answer.  Left message.  First Attempt. Provided  direct contact number for scheduling: [phone].   Only number successful was contact 1, starrique

## 2024-08-29 NOTE — Telephone Encounter (Signed)
 Hey Falecha,  I'm reaching out to follow up on this vascular US  for pt.   Thanks!

## 2024-08-30 NOTE — Telephone Encounter (Signed)
 Looks like pt has been scheduled for 09/01/24.

## 2024-09-01 ENCOUNTER — Ambulatory Visit (HOSPITAL_COMMUNITY)
Admission: RE | Admit: 2024-09-01 | Discharge: 2024-09-01 | Disposition: A | Discharge status: Home / Self Care | Source: Ambulatory Visit | Attending: Family Medicine | Admitting: Family Medicine

## 2024-09-01 ENCOUNTER — Telehealth: Payer: Self-pay | Admitting: Family Medicine

## 2024-09-01 DIAGNOSIS — M7989 Other specified soft tissue disorders: Secondary | ICD-10-CM | POA: Insufficient documentation

## 2024-09-01 DIAGNOSIS — M79661 Pain in right lower leg: Secondary | ICD-10-CM | POA: Insufficient documentation

## 2024-09-01 DIAGNOSIS — I739 Peripheral vascular disease, unspecified: Secondary | ICD-10-CM

## 2024-09-01 DIAGNOSIS — M79604 Pain in right leg: Secondary | ICD-10-CM

## 2024-09-01 NOTE — Telephone Encounter (Signed)
 Forwarding to Dr. Joane to review and advise.   FYI, R LE doppler has been completed.

## 2024-09-01 NOTE — Telephone Encounter (Signed)
 Spoke with patient and she stated she keeps having these issues with her legs and she would like to get the other one done too at some point. Let patient know that it would be too late to send something to be done today as they would have to have pre cert done prior to getting it done per Kayla. Please advise.

## 2024-09-01 NOTE — Telephone Encounter (Signed)
 Patient called and is getting ultrasound today and asked if it was too late to send in something for them to do both legs. She has pain in the left leg too. Appt is today at 2:00. Please advise.

## 2024-09-02 ENCOUNTER — Ambulatory Visit: Payer: Self-pay | Admitting: Family Medicine

## 2024-09-02 NOTE — Progress Notes (Signed)
 No blood clot is seen on vascular ultrasound.

## 2024-09-02 NOTE — Telephone Encounter (Signed)
 I would recommend looking at the arteries doing a vascular ABI test looking for artery blood flow.  Please contact patient and see if she would like to get that ordered.

## 2024-09-02 NOTE — Telephone Encounter (Signed)
 Pt also wonders, if it's not a blood clot, what else could it be?  Forwarding back to Dr. Joane to review both messages and advise.

## 2024-09-06 NOTE — Telephone Encounter (Signed)
 Called and spoke with patient, advised per Dr. Joane. Pt verbalized understanding and is agreeable to proceeding with ABI for B LE.   Order placed.

## 2024-09-06 NOTE — Addendum Note (Signed)
 Addended by: MARDY LEOTIS RAMAN on: 09/06/2024 02:32 PM   Modules accepted: Orders

## 2024-09-07 ENCOUNTER — Telehealth: Payer: Self-pay

## 2024-09-07 ENCOUNTER — Telehealth: Payer: Self-pay | Admitting: Family Medicine

## 2024-09-07 NOTE — Telephone Encounter (Addendum)
 Insurance is still trying to deny patients epidural. Provider can call  [phone] at his convenience to do a peer to peer to get this approved.   This needs to be done before 09/13/24 or it will deny with no options to reverse the denial.   Order ID [id number] for CPT code 37676

## 2024-09-07 NOTE — Telephone Encounter (Signed)
 P2P scheduled for 09/09/24 at 12:15 PM.

## 2024-09-07 NOTE — Telephone Encounter (Signed)
 P2P scheduled 12/21 at 12:15

## 2024-09-07 NOTE — Telephone Encounter (Signed)
 Forwarding to Dr. Joane.   ESI is scheduled for 09/09/24.

## 2024-09-08 ENCOUNTER — Ambulatory Visit: Admitting: Gastroenterology

## 2024-09-08 NOTE — Progress Notes (Deleted)
 Chief Complaint: Primary GI MD: Dr. Leigh  HPI: 73 year old female past medical history of aortic stenosis, anxiety, depression, hypercholesterolemia, hypertension, hypothyroidism, thyroid  nodules and chronic headaches. S/P anterior cervical discectomy and decompression and arthroplasty C6-7 06/2019.    Discussed the use of AI scribe software for clinical note transcription with the patient, who gave verbal consent to proceed.  History of Present Illness      PREVIOUS GI WORKUP   Colonoscopy 12/2019 - One 3 mm polyp in the ascending colon, removed with a cold snare. Resected and retrieved.  - One 6 mm polyp at the hepatic flexure, removed with a hot snare. Resected and retrieved.  - One 3 mm polyp at the hepatic flexure, removed with a cold snare. Resected and retrieved.  - Fourteen 3 to 7 mm polyps in the transverse colon, removed with a cold snare. Resected and retrieved.  - Two 3 to 5 mm polyps in the descending colon, removed with a cold snare. Resected and retrieved.  - Three 3 to 4 mm polyps in the sigmoid colon, removed with a cold snare. Resected and retrieved.  - One 8 mm polyp in the sigmoid colon, removed with a hot snare. Resected and retrieved.  - Diverticulosis in the sigmoid colon and in the ascending colon.  - Internal hemorrhoids.  - Hyperplastic polyps of the left colon.  - The examination was otherwise normal.  EGD 12/2019 for dysphagia - Esophagogastric landmarks identified.  - 2 cm hiatal hernia.  - Normal esophagus  - empiric dilation performed to 18mm  - Gastritis. Biopsies taken for H pylori  - Normal stomach otherwise.  - Normal duodenal bulb and second portion of the duodenum.  Diagnosis 1. Surgical [P], gastric antrum and gastric body - GASTRIC ANTRAL AND OXYNTIC MUCOSA WITH HELICOBACTER PYLORI-ASSOCIATED GASTRITIS (CONFIRMED WITH WARTHIN STARRY STAIN) 2. Surgical [P], colon, descending-2, transverse-14, hepatic flexure-HS-CS-2 and ascending  and sigmoid -3, polyp (22) - TUBULAR ADENOMA(S) WITHOUT HIGH-GRADE DYSPLASIA OR MALIGNANCY - HYPERPLASTIC POLYP(S) - INFLAMMATORY POLYP 3. Surgical [P], colon, rectosigmoid, polyp - TUBULAR ADENOMA WITHOUT HIGH-GRADE DYSPLASIA OR MALIGNANCY  Past Medical History:  Diagnosis Date   Chest pain    a. 01/2015 Echo: Nl LV fxn, Gr 1 DD, triv AI, mild MR.   Essential hypertension    Hepatic cyst    a. noted on CT 01/2015.   Hyperthyroidism    Multinodular goiter    a. 01/2015 CT chest: multinodular goidter w/ substernal extension of the left lobe of the thyroid  assoc w/ rightward deviation of tracheal air column.   Neck pain, chronic    Personal history of colonic polyps    Pulmonary nodules    a. 01/2015 CT Chest: RLL ~ 5mm subpleural nodule - rec f/u in 6-12 mos.   Splenic cyst    a. noted on CT 01/2015.    Past Surgical History:  Procedure Laterality Date   BYPASS GRAFT AORTA TO AORTA Left 11/11/2021   Procedure: BYPASS GRAFT AORTA TO LEFT COMMON  FEMORAL ARTERY;  Surgeon: Magda Debby SAILOR, MD;  Location: [facility] OR;  Service: Vascular;  Laterality: Left;   CERVICAL DISC ARTHROPLASTY N/A 07/05/2019   Procedure: Cervical Six-Seven Artificial disc replacement;  Surgeon: Colon Shove, MD;  Location: [facility] OR;  Service: Neurosurgery;  Laterality: N/A;  Cervical Six-Seven Artificial disc replacement   CESAREAN SECTION     COLONOSCOPY  01/13/2020   THORACIC AORTIC ENDOVASCULAR STENT GRAFT N/A 11/11/2021   Procedure: THORACIC AORTIC ENDOVASCULAR STENT GRAFT WITH LEFT BRACHIAL  ARTERY ACCESS;  Surgeon: Magda Debby SAILOR, MD;  Location: [facility] OR;  Service: Vascular;  Laterality: N/A;   TONSILLECTOMY     AROUND 5-6 YRS OLD   ULTRASOUND GUIDANCE FOR VASCULAR ACCESS Bilateral 11/11/2021   Procedure: ULTRASOUND GUIDANCE FOR VASCULAR ACCESS;  Surgeon: Magda Debby SAILOR, MD;  Location: [facility] OR;  Service: Vascular;  Laterality: Bilateral;   UPPER GASTROINTESTINAL ENDOSCOPY  01/13/2020    Current Outpatient  Medications  Medication Sig Dispense Refill   AMBULATORY NON FORMULARY MEDICATION Electric scooter Dispense 1 Dx code: M48.062, M54.50 Use as needed 1 Device 0   AMBULATORY NON FORMULARY MEDICATION Rollator Dispense 1 Dx code:M48.062, M54.50 Use as needed 1 Device 0   amLODipine  (NORVASC ) 10 MG tablet Take 0.5 tablets (5 mg total) by mouth daily. 45 tablet 2   ascorbic acid  (VITAMIN C) 1000 MG tablet Take 1 tablet (1,000 mg total) by mouth daily. 30 tablet 0   atorvastatin  (LIPITOR) 40 MG tablet Take 1 tablet (40 mg total) by mouth daily. 90 tablet 2   butalbital -acetaminophen -caffeine  (FIORICET) 50-325-40 MG tablet Take 1 tablet by mouth every 6 (six) hours as needed for headache. 12 tablet 5   carbamazepine  (TEGRETOL -XR) 100 MG 12 hr tablet Take 1 tablet (100 mg total) by mouth 2 (two) times daily. 30 tablet 0   carvedilol  (COREG ) 25 MG tablet Take 1 tablet (25 mg total) by mouth 2 (two) times daily with a meal. (Patient taking differently: Take 12.5 mg by mouth 2 (two) times daily with a meal.) 180 tablet 2   clotrimazole -betamethasone  (LOTRISONE ) cream Apply to affected area 2 times daily prn 15 g 0   COVID-19 mRNA vaccine, Pfizer, (COMIRNATY ) syringe Inject into the muscle. 0.3 mL 0   diclofenac  Sodium (VOLTAREN ) 1 % GEL Apply 4 g topically 4 (four) times daily. 100 g 0   DULoxetine  (CYMBALTA ) 30 MG capsule Take 1 capsule (30 mg total) by mouth at bedtime. 30 capsule 1   fluticasone  (FLONASE ) 50 MCG/ACT nasal spray Place 2 sprays into both nostrils at bedtime as needed for allergies or rhinitis. 16 g 1   HYDROcodone -acetaminophen  (NORCO/VICODIN) 5-325 MG tablet Take 1 tablet by mouth every 6 (six) hours as needed. 10 tablet 0   ibuprofen  (ADVIL ) 800 MG tablet Take 1 tablet (800 mg total) by mouth 2 (two) times daily as needed. 6 tablet 0   Ipratropium-Albuterol  (COMBIVENT  RESPIMAT) 20-100 MCG/ACT AERS respimat Inhale 1 puff into the lungs in the morning, at noon, and at bedtime. 4 g 4    losartan  (COZAAR ) 25 MG tablet Take 1 tablet (25 mg total) by mouth daily. 90 tablet 1   methimazole  (TAPAZOLE ) 5 MG tablet Take 0.5 tablets (2.5 mg total) by mouth 3 (three) times daily. Keep appt with endocrinologist 11/2022 90 tablet 3   methocarbamol  (ROBAXIN ) 500 MG tablet Take 1 tablet (500 mg total) by mouth 2 (two) times daily as needed for muscle spasms. 20 tablet 0   nitroGLYCERIN  (NITROSTAT ) 0.4 MG SL tablet Place 1 tablet (0.4 mg total) under the tongue every 5 (five) minutes as needed for chest pain. 30 tablet 0   nystatin  (MYCOSTATIN /NYSTOP ) powder Apply 1 Application topically 3 (three) times daily. 60 g 3   pantoprazole  (PROTONIX ) 40 MG tablet Take 1 tablet (40 mg total) by mouth at bedtime. 90 tablet 2   polyethylene glycol (MIRALAX  / GLYCOLAX ) 17 g packet Take 17 g by mouth daily as needed for mild constipation. 14 each 0   predniSONE  (DELTASONE ) 20 MG  tablet Take 2 tablets (40 mg total) by mouth daily. 10 tablet 0   pregabalin  (LYRICA ) 75 MG capsule Take 1 capsule (75 mg total) by mouth 2 (two) times daily as needed (nerve pain). 60 capsule 1   prochlorperazine  (COMPAZINE ) 5 MG tablet Take 1-2 tablets (5-10 mg total) by mouth every 6 (six) hours as needed for nausea. 30 tablet 0   senna (SENOKOT) 8.6 MG TABS tablet Take 2 tablets (17.2 mg total) by mouth at bedtime. 60 tablet 0   traZODone  (DESYREL ) 50 MG tablet Take 0.5-1 tablets (25-50 mg total) by mouth at bedtime as needed for sleep. 20 tablet 0   valACYclovir  (VALTREX ) 1000 MG tablet Take 1 tablet (1,000 mg total) by mouth 3 (three) times daily. 21 tablet 0   Current Facility-Administered Medications  Medication Dose Route Frequency Provider Last Rate Last Admin   betamethasone  acetate-betamethasone  sodium phosphate  (CELESTONE ) injection 9 mg  9 mg Intramuscular Once Onita Duos, MD       bupivacaine  (MARCAINE ) 0.5 % (with pres) injection 1.5 mL  1.5 mL Infiltration Once Onita Duos, MD        Allergies as of 09/08/2024 -  Review Complete 07/29/2024  Allergen Reaction Noted   Shellfish allergy Anaphylaxis 11/06/2021   Gabapentin   01/02/2022   Hm lidocaine  patch [lidocaine ] Other (See Comments) 12/12/2021   Tape Other (See Comments) 01/02/2022    Family History  Problem Relation Age of Onset   Heart attack Maternal Grandmother        deceased   High blood pressure Mother    High Cholesterol Mother    Thyroid  disease Mother    Thyroid  disease Sister    Lung cancer Father    Cancer Maternal Uncle        unk type   Cancer Sister        unk type   Colon cancer Neg Hx    Colon polyps Neg Hx    Esophageal cancer Neg Hx    Rectal cancer Neg Hx    Stomach cancer Neg Hx     Social History   Socioeconomic History   Marital status: Single    Spouse name: Not on file   Number of children: Not on file   Years of education: Not on file   Highest education level: Not on file  Occupational History   Occupation: retired  Tobacco Use   Smoking status: Light Smoker    Types: Cigarettes   Smokeless tobacco: Never   Tobacco comments:    1-2 NOT EVERY DAY   Vaping Use   Vaping status: Never Used  Substance and Sexual Activity   Alcohol use: Yes    Alcohol/week: 0.0 standard drinks of alcohol    Comment: rare   Drug use: No   Sexual activity: Not Currently  Other Topics Concern   Not on file  Social History Narrative   Lives alone.  Four children and eight grands.     Social Drivers of Corporate Investment Banker Strain: Low Risk  (12/19/2021)   Overall Financial Resource Strain (CARDIA)    Difficulty of Paying Living Expenses: Not hard at all  Food Insecurity: No Food Insecurity (11/20/2023)   Hunger Vital Sign    Worried About Running Out of Food in the Last Year: Never true    Ran Out of Food in the Last Year: Never true  Transportation Needs: No Transportation Needs (11/20/2023)   PRAPARE - Administrator, Civil Service (Medical):  No    Lack of Transportation (Non-Medical): No   Physical Activity: Inactive (03/12/2023)   Exercise Vital Sign    Days of Exercise per Week: 0 days    Minutes of Exercise per Session: 0 min  Stress: Stress Concern Present (03/12/2023)   [employer] of Occupational Health - Occupational Stress Questionnaire    Feeling of Stress : To some extent  Social Connections: Moderately Integrated (12/19/2021)   Social Connection and Isolation Panel    Frequency of Communication with Friends and Family: More than three times a week    Frequency of Social Gatherings with Friends and Family: More than three times a week    Attends Religious Services: More than 4 times per year    Active Member of [employer] or Organizations: Yes    Attends Banker Meetings: 1 to 4 times per year    Marital Status: Never married  Intimate Partner Violence: Not At Risk (12/19/2021)   Humiliation, Afraid, Rape, and Kick questionnaire    Fear of Current or Ex-Partner: No    Emotionally Abused: No    Physically Abused: No    Sexually Abused: No    Review of Systems:    Constitutional: No weight loss, fever, chills, weakness or fatigue HEENT: Eyes: No change in vision               Ears, Nose, Throat:  No change in hearing or congestion Skin: No rash or itching Cardiovascular: No chest pain, chest pressure or palpitations   Respiratory: No SOB or cough Gastrointestinal: See HPI and otherwise negative Genitourinary: No dysuria or change in urinary frequency Neurological: No headache, dizziness or syncope Musculoskeletal: No new muscle or joint pain Hematologic: No bleeding or bruising Psychiatric: No history of depression or anxiety    Physical Exam:  Vital signs: There were no vitals taken for this visit.  Constitutional: NAD, alert and cooperative Head:  Normocephalic and atraumatic. Eyes:   PEERL, EOMI. No icterus. Conjunctiva pink. Respiratory: Respirations even and unlabored. Lungs clear to auscultation bilaterally.   No wheezes, crackles,  or rhonchi.  Cardiovascular:  Regular rate and rhythm. No peripheral edema, cyanosis or pallor.  Gastrointestinal:  Soft, nondistended, nontender. No rebound or guarding. Normal bowel sounds. No appreciable masses or hepatomegaly. Rectal:  Declines Msk:  Symmetrical without gross deformities. Without edema, no deformity or joint abnormality.  Neurologic:  Alert and  oriented x4;  grossly normal neurologically.  Skin:   Dry and intact without significant lesions or rashes. Psychiatric: Oriented to person, place and time. Demonstrates good judgement and reason without abnormal affect or behaviors.  Physical Exam    RELEVANT LABS AND IMAGING: CBC    Component Value Date/Time   WBC 11.4 (H) 02/09/2024 1441   RBC 3.61 (L) 02/09/2024 1441   HGB 12.0 02/09/2024 1441   HCT 37.8 02/09/2024 1441   PLT 310 02/09/2024 1441   MCV 104.7 (H) 02/09/2024 1441   MCH 33.2 02/09/2024 1441   MCHC 31.7 02/09/2024 1441   RDW 12.1 02/09/2024 1441   LYMPHSABS 3.0 02/09/2024 1441   MONOABS 0.7 02/09/2024 1441   EOSABS 0.2 02/09/2024 1441   BASOSABS 0.0 02/09/2024 1441    CMP     Component Value Date/Time   NA 140 02/25/2024 0958   K 3.7 02/25/2024 0958   CL 104 02/25/2024 0958   CO2 28 02/25/2024 0958   GLUCOSE 90 02/25/2024 0958   BUN 9 02/25/2024 0958   CREATININE 0.50 02/25/2024 9041  CALCIUM  9.2 02/25/2024 0958   PROT 7.5 02/25/2024 0958   ALBUMIN  4.2 02/25/2024 0958   AST 14 02/25/2024 0958   ALT 11 02/25/2024 0958   ALKPHOS 103 02/25/2024 0958   BILITOT 0.3 02/25/2024 0958   GFRNONAA >60 02/09/2024 1441   GFRAA >60 01/14/2020 1124     Assessment/Plan:   Assessment and Plan Assessment & Plan     History of dysphagia EGD 2021 with empiric dilation and 2 cm hiatal hernia  History of H. Pylori History of H. pylori gastritis, EGD 2021 s/p quadruple therapy  History of colon polyps 23 polyps removed during screening colonoscopy in 2021, most adenomas.  Recall 1  year   Darlene Griffith [facility] 09/08/2024, 8:39 AM  Cc: Jordan, Betty G, MD

## 2024-09-09 ENCOUNTER — Other Ambulatory Visit

## 2024-09-09 ENCOUNTER — Telehealth: Payer: Self-pay | Admitting: Family Medicine

## 2024-09-09 NOTE — Telephone Encounter (Signed)
 P2P completed. CPT: [phone] Needs to be 3 months of pain reflex.   Auth: Until Sep 18, 2025 Auth: [id number]

## 2024-09-13 ENCOUNTER — Encounter (HOSPITAL_COMMUNITY): Payer: Self-pay

## 2024-09-14 NOTE — Telephone Encounter (Signed)
 Scheduled 11/17/23

## 2024-09-23 ENCOUNTER — Ambulatory Visit (HOSPITAL_COMMUNITY): Admission: RE | Admit: 2024-09-23 | Discharge: 2024-09-23 | Attending: Family Medicine | Admitting: Family Medicine

## 2024-09-23 DIAGNOSIS — M79604 Pain in right leg: Secondary | ICD-10-CM | POA: Diagnosis not present

## 2024-09-23 DIAGNOSIS — M79605 Pain in left leg: Secondary | ICD-10-CM

## 2024-09-23 DIAGNOSIS — I739 Peripheral vascular disease, unspecified: Secondary | ICD-10-CM

## 2024-09-23 LAB — VAS US ABI WITH/WO TBI
Left ABI: 1.58
Right ABI: 1.57

## 2024-09-23 NOTE — Discharge Instructions (Signed)

## 2024-09-26 ENCOUNTER — Inpatient Hospital Stay
Admission: RE | Admit: 2024-09-26 | Discharge: 2024-09-26 | Disposition: A | Source: Ambulatory Visit | Attending: Family Medicine | Admitting: Family Medicine

## 2024-09-26 DIAGNOSIS — M5416 Radiculopathy, lumbar region: Secondary | ICD-10-CM

## 2024-09-26 DIAGNOSIS — M4727 Other spondylosis with radiculopathy, lumbosacral region: Secondary | ICD-10-CM | POA: Diagnosis not present

## 2024-09-26 MED ORDER — METHYLPREDNISOLONE ACETATE 40 MG/ML INJ SUSP (RADIOLOG
80.0000 mg | Freq: Once | INTRAMUSCULAR | Status: AC
  Administered 2024-09-26: 80 mg via EPIDURAL

## 2024-09-26 MED ORDER — IOPAMIDOL (ISOVUE-M 200) INJECTION 41%
1.0000 mL | Freq: Once | INTRAMUSCULAR | Status: AC
  Administered 2024-09-26: 1 mL via EPIDURAL

## 2024-09-27 ENCOUNTER — Ambulatory Visit: Payer: Self-pay | Admitting: Family Medicine

## 2024-09-27 NOTE — Progress Notes (Signed)
 Blood flow to the lower legs look a little worse than they have done in the past.  I would like to send this to your vascular surgeon  Dr. Magda.  Do you have any other vascular surgeons that I am not aware of?

## 2024-10-06 ENCOUNTER — Telehealth: Payer: Self-pay

## 2024-10-06 DIAGNOSIS — I739 Peripheral vascular disease, unspecified: Secondary | ICD-10-CM

## 2024-10-06 DIAGNOSIS — M79661 Pain in right lower leg: Secondary | ICD-10-CM

## 2024-10-06 DIAGNOSIS — M79604 Pain in right leg: Secondary | ICD-10-CM

## 2024-10-06 MED ORDER — HYDROCODONE-ACETAMINOPHEN 5-325 MG PO TABS: 1.0000 | ORAL_TABLET | Freq: Four times a day (QID) | ORAL | 0 refills | Status: DC | PRN

## 2024-10-06 NOTE — Telephone Encounter (Signed)
 Referral placed back to vascular surgery.

## 2024-10-06 NOTE — Telephone Encounter (Signed)
 Per Dr. Joane, OK to place referral back to vascular surgeon since we have not heard back.

## 2024-10-06 NOTE — Telephone Encounter (Signed)
 Spoke with patient, having worsening pain in her back and legs.   Would like a refill of Hydrocodone -APAP

## 2024-10-06 NOTE — Telephone Encounter (Signed)
 Called and spoke with pt to review results. Advised that Dr. Joane has been unable to get in touch with her vascular surgeon, will likely refer back to Dr. Vonzell. Pt verbalized understanding.   Would you like to place referral back to vascular surgeon? Pt was unsure if she'd ever been seen by any other vascular surgeon.

## 2024-10-06 NOTE — Addendum Note (Signed)
 Addended by: MARDY LEOTIS RAMAN on: 10/06/2024 03:14 PM   Modules accepted: Orders

## 2024-10-24 ENCOUNTER — Ambulatory Visit: Admitting: Neurology

## 2024-10-24 ENCOUNTER — Telehealth: Payer: Self-pay | Admitting: Neurology

## 2024-10-24 NOTE — Telephone Encounter (Signed)
 I called pt her home # no answer and VM not able to LM.   I called her daughter she will relay to her to call us .  She was at urgent care.

## 2024-10-24 NOTE — Telephone Encounter (Signed)
 Pt called stating that she is in a lot of Pain , Pt stated the her entire right side of her face in in pain from the inside of her mouth to her ears. Pt  stated she is in a lot of pain  and wanted to know if she can see  Md sooner then appt Scheduled.

## 2024-10-25 NOTE — Telephone Encounter (Signed)
 I called pt [phone], no answer, VM full could not LM.

## 2024-10-26 NOTE — Telephone Encounter (Signed)
 I called pt home did not pick up, VM full could not LM.

## 2024-10-27 ENCOUNTER — Telehealth: Payer: Self-pay | Admitting: Family Medicine

## 2024-10-27 NOTE — Telephone Encounter (Signed)
 Called and spoke with patient, needs FMLA form renewal. Will complete form and fax back by the end of the day.

## 2024-10-27 NOTE — Telephone Encounter (Signed)
 Patient called and asked that [facility] call her as soon as she can.

## 2024-10-28 NOTE — Telephone Encounter (Signed)
 Form completed, reviewed and signed by Dr. Joane, placed at the front desk for faxing [phone]).

## 2024-11-03 LAB — OPHTHALMOLOGY REPORT-SCANNED

## 2024-11-18 ENCOUNTER — Encounter: Payer: Self-pay | Admitting: Family Medicine

## 2024-11-18 ENCOUNTER — Ambulatory Visit: Admitting: Family Medicine

## 2024-11-18 ENCOUNTER — Telehealth: Payer: Self-pay

## 2024-11-18 VITALS — BP 162/84 | HR 99 | Ht 60.0 in

## 2024-11-18 DIAGNOSIS — G8929 Other chronic pain: Secondary | ICD-10-CM | POA: Diagnosis not present

## 2024-11-18 DIAGNOSIS — M79605 Pain in left leg: Secondary | ICD-10-CM | POA: Diagnosis not present

## 2024-11-18 DIAGNOSIS — M546 Pain in thoracic spine: Secondary | ICD-10-CM | POA: Diagnosis not present

## 2024-11-18 DIAGNOSIS — M79604 Pain in right leg: Secondary | ICD-10-CM

## 2024-11-18 DIAGNOSIS — M5416 Radiculopathy, lumbar region: Secondary | ICD-10-CM

## 2024-11-18 MED ORDER — HYDROCODONE-ACETAMINOPHEN 10-325 MG PO TABS: 1.0000 | ORAL_TABLET | Freq: Three times a day (TID) | ORAL | 0 refills | Status: AC | PRN

## 2024-11-18 NOTE — Patient Instructions (Addendum)
 Thank you for coming in today.   [facility] at [facility] [address], [place], [place] [address] [phone] Locate on Map  Order placed for thoracic (mid-back) injection at [facility].   Hydrocodone  refilled today.   We will work on ordering an passenger transport manager and walker for you.   See you back as needed.

## 2024-11-18 NOTE — Progress Notes (Signed)
 "        I, Leotis Batter, CMA acting as a scribe for Artist Lloyd, MD.  Darlene Griffith is a 74 y.o. female who presents to [facility] today for exacerbation of her symptoms related to her lumbar spinal stenosis. Pt was last seen by Dr. Lloyd on 07/08/24 and a repeat lumbar ESI was ordered and hydrocodone  refilled. Pt also given rx for a new rollator and electric scooter.  Today, pt reports worsening LBP. Ambulating in a wheelchair today. Also having pain in the legs. Pain alternates legs, sometimes radiating into the hip. At times, unable to stand for greater than 2 minutes without severe pain. Gets some relief when leaning to the side.   Dominant pain today is in the lower thoracic back region.  She does have base of lumbar spine pain as well but that is less dominant.  She does have some pain radiating down her legs but again that is less dominant.  Pertinent review of systems: No fevers or chills  Relevant historical information: Chronic back pain and chronic lumbar radiculopathy occur in the setting of vascular disease.   Exam:  BP (!) 162/84   Pulse 99   Ht 5' (1.524 m)   SpO2 97%   BMI 27.73 kg/m  General: Well Developed, well nourished, and in no acute distress.   MSK: T-spine some scoliosis is present.  Tender palpation paraspinal musculature and mildly tender palpation thoracic lumbar junction along midline.  L-spine nontender to palpation midline.  Lower EXTR strength is mildly decreased.    Lab and Radiology Results No results found for this or any previous visit (from the past 72 hours). No results found.     Assessment and Plan: 74 y.o. female with chronic back pain.  She has multiple different spine diagnoses that all are occurring around the same time.  The dominant problem today is chronic low thoracic or high lumbar pain with some component of high lumbar or lower thoracic radiculopathy.  She has had epidural steroid injections for  this in the past most recently at [facility] in September 2024 which was pretty effective.  Will go ahead and repeat that epidural steroid injection with wiggle room for the radiologist to pick and choose the correct location.  Additionally I am trying to get her in with vascular surgery as she did have an vascular ultrasound that showed worsening ABI.  I referred her at the last visit but there is some miscommunication.  Provided the phone number so she can schedule yourself.  For overall pain her pain is quite severe rated up to 10 out of 10 at times.  Will prescribe higher dose of hydrocodone  for for pain control now  At the last visit I did prescribe a walker and electric wheelchair/scooter.  Will represcribe that provide information on how to get that set up. PDMP reviewed during this encounter. Orders Placed This Encounter  Procedures   DG INJECT DIAG/THERA/INC NEEDLE/CATH/PLC EPI/CERV/THOR W/IMG    Lower thoracic spine as previously done with Dr. Derrill.    Standing Status:   Future    Expiration Date:   11/18/2025    Reason for Exam (SYMPTOM  OR DIAGNOSIS REQUIRED):   mid and lower back pain with radicular symptoms    Preferred Imaging Location?:   [facility]   Meds ordered this encounter  Medications   HYDROcodone -acetaminophen  (NORCO) 10-325 MG tablet    Sig: Take 1 tablet by mouth every 8 (eight)  hours as needed.    Dispense:  30 tablet    Refill:  0     Discussed warning signs or symptoms. Please see discharge instructions. Patient expresses understanding.   The above documentation has been reviewed and is accurate and complete Artist Lloyd, M.D.   "

## 2024-11-18 NOTE — Telephone Encounter (Signed)
 Need to order DME   Electric wheelchair walker

## 2024-11-18 NOTE — Telephone Encounter (Signed)
 [facility]

## 2024-11-22 MED ORDER — AMBULATORY NON FORMULARY MEDICATION: 0 refills | Status: AC

## 2024-11-25 NOTE — Telephone Encounter (Signed)
 Form updated to reflect consecutive absences.  Placed at the front desk for faxing/scanning.

## 2025-02-09 ENCOUNTER — Ambulatory Visit: Admitting: Neurology
